# Patient Record
Sex: Male | Born: 1945 | Race: White | Hispanic: No | Smoking: Former smoker
Health system: Southern US, Community
[De-identification: ages and names within clinical notes are randomized; demographics above are authoritative.]

## PROBLEM LIST (undated history)

## (undated) DIAGNOSIS — R972 Elevated prostate specific antigen [PSA]: Secondary | ICD-10-CM

## (undated) DIAGNOSIS — M79662 Pain in left lower leg: Secondary | ICD-10-CM

## (undated) DIAGNOSIS — J45909 Unspecified asthma, uncomplicated: Secondary | ICD-10-CM

## (undated) DIAGNOSIS — M545 Low back pain, unspecified: Secondary | ICD-10-CM

## (undated) DIAGNOSIS — I441 Atrioventricular block, second degree: Secondary | ICD-10-CM

## (undated) DIAGNOSIS — J069 Acute upper respiratory infection, unspecified: Secondary | ICD-10-CM

## (undated) DIAGNOSIS — J189 Pneumonia, unspecified organism: Secondary | ICD-10-CM

## (undated) DIAGNOSIS — E119 Type 2 diabetes mellitus without complications: Secondary | ICD-10-CM

## (undated) DIAGNOSIS — N401 Enlarged prostate with lower urinary tract symptoms: Secondary | ICD-10-CM

## (undated) DIAGNOSIS — Z8614 Personal history of Methicillin resistant Staphylococcus aureus infection: Secondary | ICD-10-CM

## (undated) DIAGNOSIS — Z8673 Personal history of transient ischemic attack (TIA), and cerebral infarction without residual deficits: Secondary | ICD-10-CM

## (undated) DIAGNOSIS — K5730 Diverticulosis of large intestine without perforation or abscess without bleeding: Secondary | ICD-10-CM

## (undated) DIAGNOSIS — Z7902 Long term (current) use of antithrombotics/antiplatelets: Secondary | ICD-10-CM

## (undated) DIAGNOSIS — W19XXXA Unspecified fall, initial encounter: Secondary | ICD-10-CM

## (undated) DIAGNOSIS — N138 Other obstructive and reflux uropathy: Secondary | ICD-10-CM

## (undated) DIAGNOSIS — M1990 Unspecified osteoarthritis, unspecified site: Secondary | ICD-10-CM

## (undated) DIAGNOSIS — K449 Diaphragmatic hernia without obstruction or gangrene: Secondary | ICD-10-CM

## (undated) DIAGNOSIS — C3490 Malignant neoplasm of unspecified part of unspecified bronchus or lung: Secondary | ICD-10-CM

## (undated) DIAGNOSIS — K2970 Gastritis, unspecified, without bleeding: Secondary | ICD-10-CM

## (undated) DIAGNOSIS — R339 Retention of urine, unspecified: Secondary | ICD-10-CM

## (undated) DIAGNOSIS — I442 Atrioventricular block, complete: Secondary | ICD-10-CM

## (undated) DIAGNOSIS — M79661 Pain in right lower leg: Secondary | ICD-10-CM

## (undated) DIAGNOSIS — K648 Other hemorrhoids: Secondary | ICD-10-CM

## (undated) DIAGNOSIS — K219 Gastro-esophageal reflux disease without esophagitis: Secondary | ICD-10-CM

## (undated) DIAGNOSIS — E785 Hyperlipidemia, unspecified: Secondary | ICD-10-CM

## (undated) DIAGNOSIS — Z860100 Personal history of colon polyps, unspecified: Secondary | ICD-10-CM

## (undated) DIAGNOSIS — I4891 Unspecified atrial fibrillation: Secondary | ICD-10-CM

## (undated) DIAGNOSIS — D3502 Benign neoplasm of left adrenal gland: Secondary | ICD-10-CM

## (undated) DIAGNOSIS — I2510 Atherosclerotic heart disease of native coronary artery without angina pectoris: Secondary | ICD-10-CM

## (undated) DIAGNOSIS — I700 Atherosclerosis of aorta: Secondary | ICD-10-CM

## (undated) DIAGNOSIS — D649 Anemia, unspecified: Secondary | ICD-10-CM

## (undated) DIAGNOSIS — I10 Essential (primary) hypertension: Secondary | ICD-10-CM

## (undated) DIAGNOSIS — I998 Other disorder of circulatory system: Secondary | ICD-10-CM

## (undated) DIAGNOSIS — Z794 Long term (current) use of insulin: Secondary | ICD-10-CM

## (undated) DIAGNOSIS — K31819 Angiodysplasia of stomach and duodenum without bleeding: Secondary | ICD-10-CM

## (undated) DIAGNOSIS — R0609 Other forms of dyspnea: Secondary | ICD-10-CM

## (undated) DIAGNOSIS — K222 Esophageal obstruction: Secondary | ICD-10-CM

## (undated) DIAGNOSIS — I5189 Other ill-defined heart diseases: Secondary | ICD-10-CM

## (undated) DIAGNOSIS — I6523 Occlusion and stenosis of bilateral carotid arteries: Secondary | ICD-10-CM

## (undated) DIAGNOSIS — N529 Male erectile dysfunction, unspecified: Secondary | ICD-10-CM

## (undated) DIAGNOSIS — I739 Peripheral vascular disease, unspecified: Secondary | ICD-10-CM

## (undated) DIAGNOSIS — Z720 Tobacco use: Secondary | ICD-10-CM

## (undated) DIAGNOSIS — J439 Emphysema, unspecified: Secondary | ICD-10-CM

## (undated) HISTORY — DX: Hyperlipidemia, unspecified: E78.5

## (undated) HISTORY — DX: Retention of urine, unspecified: R33.9

## (undated) HISTORY — DX: Gastritis, unspecified, without bleeding: K29.70

## (undated) HISTORY — DX: Low back pain, unspecified: M54.50

## (undated) HISTORY — DX: Diaphragmatic hernia without obstruction or gangrene: K44.9

## (undated) HISTORY — DX: Unspecified osteoarthritis, unspecified site: M19.90

## (undated) HISTORY — DX: Essential (primary) hypertension: I10

## (undated) HISTORY — DX: Low back pain: M54.5

## (undated) HISTORY — DX: Acute upper respiratory infection, unspecified: J06.9

## (undated) HISTORY — DX: Emphysema, unspecified: J43.9

## (undated) HISTORY — DX: Gastro-esophageal reflux disease without esophagitis: K21.9

## (undated) HISTORY — DX: Benign prostatic hyperplasia with lower urinary tract symptoms: N40.1

## (undated) HISTORY — PX: UPPER GASTROINTESTINAL ENDOSCOPY: SHX188

## (undated) HISTORY — DX: Other obstructive and reflux uropathy: N13.8

## (undated) HISTORY — DX: Elevated prostate specific antigen (PSA): R97.20

## (undated) HISTORY — DX: Tobacco use: Z72.0

## (undated) HISTORY — DX: Unspecified asthma, uncomplicated: J45.909

## (undated) HISTORY — PX: COLONOSCOPY: SHX174

## (undated) HISTORY — DX: Atherosclerotic heart disease of native coronary artery without angina pectoris: I25.10

## (undated) HISTORY — PX: BACK SURGERY: SHX140

## (undated) HISTORY — DX: Other hemorrhoids: K64.8

## (undated) HISTORY — DX: Personal history of colonic polyps: Z86.010

## (undated) HISTORY — DX: Esophageal obstruction: K22.2

## (undated) HISTORY — DX: Personal history of colon polyps, unspecified: Z86.0100

---

## 1993-12-07 HISTORY — PX: POLYPECTOMY: SHX149

## 1996-11-06 ENCOUNTER — Encounter (INDEPENDENT_AMBULATORY_CARE_PROVIDER_SITE_OTHER): Payer: Self-pay | Admitting: Internal Medicine

## 1997-05-29 ENCOUNTER — Ambulatory Visit (HOSPITAL_BASED_OUTPATIENT_CLINIC_OR_DEPARTMENT_OTHER): Admission: RE | Admit: 1997-05-29 | Discharge: 1997-05-29 | Payer: Self-pay | Admitting: Surgery

## 1998-05-08 ENCOUNTER — Encounter (INDEPENDENT_AMBULATORY_CARE_PROVIDER_SITE_OTHER): Payer: Self-pay | Admitting: Internal Medicine

## 1998-11-07 HISTORY — PX: KNEE ARTHROSCOPY: SUR90

## 1999-03-07 ENCOUNTER — Inpatient Hospital Stay (HOSPITAL_COMMUNITY): Admission: EM | Admit: 1999-03-07 | Discharge: 1999-03-09 | Payer: Self-pay | Admitting: Emergency Medicine

## 1999-04-01 ENCOUNTER — Encounter: Admission: RE | Admit: 1999-04-01 | Discharge: 1999-06-30 | Payer: Self-pay | Admitting: Internal Medicine

## 1999-04-07 HISTORY — PX: OTHER SURGICAL HISTORY: SHX169

## 2000-03-09 ENCOUNTER — Encounter (INDEPENDENT_AMBULATORY_CARE_PROVIDER_SITE_OTHER): Payer: Self-pay | Admitting: Internal Medicine

## 2000-11-06 ENCOUNTER — Encounter (INDEPENDENT_AMBULATORY_CARE_PROVIDER_SITE_OTHER): Payer: Self-pay | Admitting: Internal Medicine

## 2000-11-06 LAB — CONVERTED CEMR LAB: Hgb A1c MFr Bld: 9.7 %

## 2001-01-06 ENCOUNTER — Encounter (INDEPENDENT_AMBULATORY_CARE_PROVIDER_SITE_OTHER): Payer: Self-pay | Admitting: Internal Medicine

## 2001-01-06 LAB — CONVERTED CEMR LAB: PSA: 1 ng/mL

## 2001-04-10 ENCOUNTER — Encounter: Admission: RE | Admit: 2001-04-10 | Discharge: 2001-07-09 | Payer: Self-pay | Admitting: *Deleted

## 2001-07-07 HISTORY — PX: OTHER SURGICAL HISTORY: SHX169

## 2001-07-28 ENCOUNTER — Encounter: Payer: Self-pay | Admitting: Emergency Medicine

## 2001-07-28 ENCOUNTER — Emergency Department (HOSPITAL_COMMUNITY): Admission: EM | Admit: 2001-07-28 | Discharge: 2001-07-28 | Payer: Self-pay | Admitting: *Deleted

## 2001-07-29 ENCOUNTER — Encounter: Payer: Self-pay | Admitting: Internal Medicine

## 2001-07-29 ENCOUNTER — Inpatient Hospital Stay (HOSPITAL_COMMUNITY): Admission: EM | Admit: 2001-07-29 | Discharge: 2001-08-02 | Payer: Self-pay | Admitting: Podiatry

## 2001-07-30 ENCOUNTER — Encounter: Payer: Self-pay | Admitting: Internal Medicine

## 2001-08-01 ENCOUNTER — Encounter: Payer: Self-pay | Admitting: Internal Medicine

## 2002-01-06 ENCOUNTER — Encounter (INDEPENDENT_AMBULATORY_CARE_PROVIDER_SITE_OTHER): Payer: Self-pay | Admitting: Internal Medicine

## 2003-03-10 ENCOUNTER — Encounter (INDEPENDENT_AMBULATORY_CARE_PROVIDER_SITE_OTHER): Payer: Self-pay | Admitting: Internal Medicine

## 2003-03-10 LAB — CONVERTED CEMR LAB: PSA: 1.1 ng/mL

## 2003-11-07 ENCOUNTER — Encounter (INDEPENDENT_AMBULATORY_CARE_PROVIDER_SITE_OTHER): Payer: Self-pay | Admitting: Internal Medicine

## 2003-11-12 ENCOUNTER — Encounter: Payer: Self-pay | Admitting: Gastroenterology

## 2003-11-12 LAB — HM COLONOSCOPY

## 2004-02-12 ENCOUNTER — Ambulatory Visit: Payer: Self-pay | Admitting: Family Medicine

## 2004-03-11 ENCOUNTER — Ambulatory Visit: Payer: Self-pay | Admitting: Family Medicine

## 2004-04-18 ENCOUNTER — Ambulatory Visit: Payer: Self-pay | Admitting: Family Medicine

## 2004-05-07 ENCOUNTER — Encounter (INDEPENDENT_AMBULATORY_CARE_PROVIDER_SITE_OTHER): Payer: Self-pay | Admitting: Internal Medicine

## 2004-06-01 ENCOUNTER — Ambulatory Visit: Payer: Self-pay | Admitting: Family Medicine

## 2004-06-06 ENCOUNTER — Encounter (INDEPENDENT_AMBULATORY_CARE_PROVIDER_SITE_OTHER): Payer: Self-pay | Admitting: Internal Medicine

## 2004-06-07 ENCOUNTER — Ambulatory Visit: Payer: Self-pay | Admitting: Family Medicine

## 2004-08-06 HISTORY — PX: OTHER SURGICAL HISTORY: SHX169

## 2004-08-23 ENCOUNTER — Ambulatory Visit: Payer: Self-pay | Admitting: Cardiology

## 2004-08-23 ENCOUNTER — Inpatient Hospital Stay (HOSPITAL_COMMUNITY): Admission: EM | Admit: 2004-08-23 | Discharge: 2004-08-24 | Payer: Self-pay | Admitting: *Deleted

## 2004-08-24 ENCOUNTER — Ambulatory Visit: Payer: Self-pay

## 2004-09-02 ENCOUNTER — Ambulatory Visit: Payer: Self-pay | Admitting: Cardiology

## 2004-09-06 ENCOUNTER — Ambulatory Visit: Payer: Self-pay | Admitting: Family Medicine

## 2004-09-06 ENCOUNTER — Encounter (INDEPENDENT_AMBULATORY_CARE_PROVIDER_SITE_OTHER): Payer: Self-pay | Admitting: Internal Medicine

## 2004-09-06 LAB — CONVERTED CEMR LAB: Hgb A1c MFr Bld: 6.3 %

## 2004-10-07 ENCOUNTER — Encounter (INDEPENDENT_AMBULATORY_CARE_PROVIDER_SITE_OTHER): Payer: Self-pay | Admitting: Internal Medicine

## 2004-12-02 ENCOUNTER — Ambulatory Visit: Payer: Self-pay | Admitting: Cardiology

## 2005-04-06 ENCOUNTER — Encounter (INDEPENDENT_AMBULATORY_CARE_PROVIDER_SITE_OTHER): Payer: Self-pay | Admitting: Internal Medicine

## 2005-04-06 LAB — CONVERTED CEMR LAB: Hgb A1c MFr Bld: 6.5 %

## 2005-04-19 ENCOUNTER — Ambulatory Visit: Payer: Self-pay | Admitting: Family Medicine

## 2005-04-20 ENCOUNTER — Encounter (INDEPENDENT_AMBULATORY_CARE_PROVIDER_SITE_OTHER): Payer: Self-pay | Admitting: Internal Medicine

## 2005-04-20 LAB — CONVERTED CEMR LAB: Microalbumin U total vol: 0.8 mg/L

## 2005-09-06 ENCOUNTER — Encounter (INDEPENDENT_AMBULATORY_CARE_PROVIDER_SITE_OTHER): Payer: Self-pay | Admitting: Internal Medicine

## 2005-09-19 ENCOUNTER — Ambulatory Visit: Payer: Self-pay | Admitting: Family Medicine

## 2005-09-25 ENCOUNTER — Ambulatory Visit: Payer: Self-pay | Admitting: Cardiovascular Disease

## 2006-01-15 ENCOUNTER — Ambulatory Visit: Payer: Self-pay | Admitting: Family Medicine

## 2006-01-16 ENCOUNTER — Encounter (INDEPENDENT_AMBULATORY_CARE_PROVIDER_SITE_OTHER): Payer: Self-pay | Admitting: Internal Medicine

## 2006-01-16 ENCOUNTER — Ambulatory Visit: Payer: Self-pay | Admitting: Family Medicine

## 2006-01-16 LAB — CONVERTED CEMR LAB: Hgb A1c MFr Bld: 7 %

## 2006-06-18 ENCOUNTER — Ambulatory Visit: Payer: Self-pay | Admitting: Internal Medicine

## 2006-06-20 LAB — CONVERTED CEMR LAB: Hgb A1c MFr Bld: 7.9 % — ABNORMAL HIGH (ref 4.6–6.0)

## 2006-06-21 ENCOUNTER — Ambulatory Visit: Payer: Self-pay | Admitting: Internal Medicine

## 2006-06-27 ENCOUNTER — Encounter (INDEPENDENT_AMBULATORY_CARE_PROVIDER_SITE_OTHER): Payer: Self-pay | Admitting: Internal Medicine

## 2006-06-27 DIAGNOSIS — I2510 Atherosclerotic heart disease of native coronary artery without angina pectoris: Secondary | ICD-10-CM

## 2006-06-27 DIAGNOSIS — M545 Low back pain, unspecified: Secondary | ICD-10-CM | POA: Insufficient documentation

## 2006-06-27 DIAGNOSIS — K648 Other hemorrhoids: Secondary | ICD-10-CM | POA: Insufficient documentation

## 2006-06-27 DIAGNOSIS — E119 Type 2 diabetes mellitus without complications: Secondary | ICD-10-CM | POA: Insufficient documentation

## 2006-06-27 DIAGNOSIS — Z720 Tobacco use: Secondary | ICD-10-CM

## 2006-06-27 DIAGNOSIS — E785 Hyperlipidemia, unspecified: Secondary | ICD-10-CM

## 2006-06-28 ENCOUNTER — Ambulatory Visit: Payer: Self-pay | Admitting: Internal Medicine

## 2006-08-29 ENCOUNTER — Ambulatory Visit: Payer: Self-pay | Admitting: Internal Medicine

## 2006-11-19 ENCOUNTER — Ambulatory Visit: Payer: Self-pay | Admitting: Internal Medicine

## 2006-11-19 DIAGNOSIS — I10 Essential (primary) hypertension: Secondary | ICD-10-CM

## 2006-11-19 DIAGNOSIS — R972 Elevated prostate specific antigen [PSA]: Secondary | ICD-10-CM | POA: Insufficient documentation

## 2006-11-19 DIAGNOSIS — J069 Acute upper respiratory infection, unspecified: Secondary | ICD-10-CM | POA: Insufficient documentation

## 2006-11-22 LAB — CONVERTED CEMR LAB
ALT: 19 units/L (ref 0–53)
BUN: 15 mg/dL (ref 6–23)
CO2: 27 meq/L (ref 19–32)
Calcium: 9.3 mg/dL (ref 8.4–10.5)
Creatinine,U: 152.8 mg/dL
GFR calc non Af Amer: 91 mL/min
Glucose, Bld: 124 mg/dL — ABNORMAL HIGH (ref 70–99)
Hgb A1c MFr Bld: 6.8 % — ABNORMAL HIGH (ref 4.6–6.0)
LDL Cholesterol: 59 mg/dL (ref 0–99)
Microalb, Ur: 1 mg/dL (ref 0.0–1.9)
PSA: 1.74 ng/mL (ref 0.10–4.00)
Potassium: 4 meq/L (ref 3.5–5.1)
Sodium: 140 meq/L (ref 135–145)
Total CHOL/HDL Ratio: 3.2
VLDL: 17 mg/dL (ref 0–40)

## 2006-11-23 ENCOUNTER — Encounter (INDEPENDENT_AMBULATORY_CARE_PROVIDER_SITE_OTHER): Payer: Self-pay | Admitting: Internal Medicine

## 2006-12-23 ENCOUNTER — Emergency Department (HOSPITAL_COMMUNITY): Admission: EM | Admit: 2006-12-23 | Discharge: 2006-12-23 | Payer: Self-pay | Admitting: Emergency Medicine

## 2006-12-23 ENCOUNTER — Encounter (INDEPENDENT_AMBULATORY_CARE_PROVIDER_SITE_OTHER): Payer: Self-pay | Admitting: Internal Medicine

## 2006-12-25 ENCOUNTER — Emergency Department (HOSPITAL_COMMUNITY): Admission: EM | Admit: 2006-12-25 | Discharge: 2006-12-25 | Payer: Self-pay | Admitting: Emergency Medicine

## 2006-12-25 ENCOUNTER — Encounter (INDEPENDENT_AMBULATORY_CARE_PROVIDER_SITE_OTHER): Payer: Self-pay | Admitting: Internal Medicine

## 2006-12-26 ENCOUNTER — Ambulatory Visit: Payer: Self-pay | Admitting: Family Medicine

## 2006-12-26 DIAGNOSIS — K2990 Gastroduodenitis, unspecified, without bleeding: Secondary | ICD-10-CM

## 2006-12-26 DIAGNOSIS — K222 Esophageal obstruction: Secondary | ICD-10-CM

## 2006-12-26 DIAGNOSIS — K2970 Gastritis, unspecified, without bleeding: Secondary | ICD-10-CM | POA: Insufficient documentation

## 2007-01-02 ENCOUNTER — Encounter (INDEPENDENT_AMBULATORY_CARE_PROVIDER_SITE_OTHER): Payer: Self-pay | Admitting: Internal Medicine

## 2007-01-08 ENCOUNTER — Encounter (INDEPENDENT_AMBULATORY_CARE_PROVIDER_SITE_OTHER): Payer: Self-pay | Admitting: Internal Medicine

## 2007-01-16 ENCOUNTER — Ambulatory Visit: Payer: Self-pay | Admitting: Gastroenterology

## 2007-01-25 ENCOUNTER — Encounter (INDEPENDENT_AMBULATORY_CARE_PROVIDER_SITE_OTHER): Payer: Self-pay | Admitting: Internal Medicine

## 2007-02-12 ENCOUNTER — Ambulatory Visit: Payer: Self-pay | Admitting: Family Medicine

## 2007-02-13 ENCOUNTER — Ambulatory Visit: Payer: Self-pay | Admitting: Gastroenterology

## 2007-02-13 ENCOUNTER — Encounter (INDEPENDENT_AMBULATORY_CARE_PROVIDER_SITE_OTHER): Payer: Self-pay | Admitting: Internal Medicine

## 2007-02-13 HISTORY — PX: ESOPHAGOGASTRODUODENOSCOPY: SHX1529

## 2007-02-14 LAB — CONVERTED CEMR LAB: Hgb A1c MFr Bld: 6.7 % — ABNORMAL HIGH (ref 4.6–6.0)

## 2007-04-15 ENCOUNTER — Ambulatory Visit: Payer: Self-pay | Admitting: Family Medicine

## 2007-04-18 ENCOUNTER — Encounter (INDEPENDENT_AMBULATORY_CARE_PROVIDER_SITE_OTHER): Payer: Self-pay | Admitting: Internal Medicine

## 2007-05-13 ENCOUNTER — Ambulatory Visit: Payer: Self-pay | Admitting: Family Medicine

## 2007-05-31 ENCOUNTER — Ambulatory Visit: Payer: Self-pay | Admitting: Family Medicine

## 2007-05-31 LAB — CONVERTED CEMR LAB
Chloride: 111 meq/L (ref 96–112)
Cholesterol: 118 mg/dL (ref 0–200)
Potassium: 4.3 meq/L (ref 3.5–5.1)
Sodium: 143 meq/L (ref 135–145)
Triglycerides: 73 mg/dL (ref 0–149)

## 2007-06-12 ENCOUNTER — Ambulatory Visit: Payer: Self-pay | Admitting: Family Medicine

## 2007-06-28 ENCOUNTER — Emergency Department: Payer: Self-pay | Admitting: Emergency Medicine

## 2007-07-16 ENCOUNTER — Ambulatory Visit: Payer: Self-pay | Admitting: Family Medicine

## 2007-07-19 ENCOUNTER — Encounter (INDEPENDENT_AMBULATORY_CARE_PROVIDER_SITE_OTHER): Payer: Self-pay | Admitting: Internal Medicine

## 2007-08-28 ENCOUNTER — Encounter (INDEPENDENT_AMBULATORY_CARE_PROVIDER_SITE_OTHER): Payer: Self-pay | Admitting: Internal Medicine

## 2007-08-28 HISTORY — PX: OTHER SURGICAL HISTORY: SHX169

## 2007-10-09 ENCOUNTER — Encounter (INDEPENDENT_AMBULATORY_CARE_PROVIDER_SITE_OTHER): Payer: Self-pay | Admitting: Internal Medicine

## 2007-11-06 ENCOUNTER — Encounter (INDEPENDENT_AMBULATORY_CARE_PROVIDER_SITE_OTHER): Payer: Self-pay | Admitting: *Deleted

## 2007-11-12 ENCOUNTER — Ambulatory Visit: Payer: Self-pay | Admitting: Family Medicine

## 2007-11-14 LAB — CONVERTED CEMR LAB
AST: 18 units/L (ref 0–37)
BUN: 16 mg/dL (ref 6–23)
CO2: 28 meq/L (ref 19–32)
Calcium: 9.1 mg/dL (ref 8.4–10.5)
Chloride: 111 meq/L (ref 96–112)
Creatinine,U: 111.6 mg/dL
GFR calc Af Amer: 110 mL/min
GFR calc non Af Amer: 91 mL/min
Glucose, Bld: 120 mg/dL — ABNORMAL HIGH (ref 70–99)
HDL: 36.6 mg/dL — ABNORMAL LOW (ref >39.0)
Microalb, Ur: 1.3 mg/dL (ref 0.0–1.9)
Sodium: 143 meq/L (ref 135–145)
Triglycerides: 77 mg/dL (ref 0–149)
VLDL: 15 mg/dL (ref 0–40)

## 2007-11-19 ENCOUNTER — Encounter: Payer: Self-pay | Admitting: Family Medicine

## 2007-11-19 LAB — HM DIABETES EYE EXAM: HM Diabetic Eye Exam: NORMAL

## 2007-12-31 ENCOUNTER — Ambulatory Visit: Payer: Self-pay | Admitting: Family Medicine

## 2007-12-31 LAB — CONVERTED CEMR LAB: Rapid Strep: NEGATIVE

## 2008-01-07 ENCOUNTER — Ambulatory Visit: Payer: Self-pay | Admitting: Family Medicine

## 2008-05-12 ENCOUNTER — Ambulatory Visit: Payer: Self-pay | Admitting: Internal Medicine

## 2008-05-14 LAB — CONVERTED CEMR LAB
Hgb A1c MFr Bld: 7.1 % — ABNORMAL HIGH (ref 4.6–6.5)
Total Bilirubin: 0.7 mg/dL (ref 0.3–1.2)
Total CHOL/HDL Ratio: 4
Triglycerides: 107 mg/dL (ref 0.0–149.0)
VLDL: 21.4 mg/dL (ref 0.0–40.0)

## 2008-08-17 ENCOUNTER — Ambulatory Visit: Payer: Self-pay | Admitting: Family Medicine

## 2008-08-19 LAB — CONVERTED CEMR LAB
ALT: 17 units/L (ref 0–53)
Hgb A1c MFr Bld: 7.5 % — ABNORMAL HIGH (ref 4.6–6.5)

## 2008-08-21 ENCOUNTER — Ambulatory Visit: Payer: Self-pay | Admitting: Family Medicine

## 2008-09-16 ENCOUNTER — Telehealth (INDEPENDENT_AMBULATORY_CARE_PROVIDER_SITE_OTHER): Payer: Self-pay | Admitting: Internal Medicine

## 2008-09-22 ENCOUNTER — Ambulatory Visit: Payer: Self-pay | Admitting: Family Medicine

## 2008-10-29 ENCOUNTER — Ambulatory Visit: Payer: Self-pay | Admitting: Family Medicine

## 2008-10-29 DIAGNOSIS — T23139A Burn of first degree of unspecified multiple fingers (nail), not including thumb, initial encounter: Secondary | ICD-10-CM | POA: Insufficient documentation

## 2008-10-29 DIAGNOSIS — T23209A Burn of second degree of unspecified hand, unspecified site, initial encounter: Secondary | ICD-10-CM | POA: Insufficient documentation

## 2008-11-03 ENCOUNTER — Ambulatory Visit: Payer: Self-pay | Admitting: Family Medicine

## 2008-11-23 ENCOUNTER — Ambulatory Visit: Payer: Self-pay | Admitting: Family Medicine

## 2008-12-18 ENCOUNTER — Ambulatory Visit: Payer: Self-pay | Admitting: Family Medicine

## 2008-12-22 LAB — CONVERTED CEMR LAB: Hgb A1c MFr Bld: 6.6 % — ABNORMAL HIGH (ref 4.6–6.5)

## 2008-12-24 ENCOUNTER — Ambulatory Visit: Payer: Self-pay | Admitting: Family Medicine

## 2009-03-23 ENCOUNTER — Telehealth: Payer: Self-pay | Admitting: Family Medicine

## 2009-04-20 ENCOUNTER — Ambulatory Visit: Payer: Self-pay | Admitting: Family Medicine

## 2009-04-21 LAB — CONVERTED CEMR LAB: Hgb A1c MFr Bld: 12.4 % — ABNORMAL HIGH (ref 4.6–6.5)

## 2009-04-23 ENCOUNTER — Ambulatory Visit: Payer: Self-pay | Admitting: Family Medicine

## 2009-04-29 LAB — CONVERTED CEMR LAB
ALT: 20 units/L (ref 0–53)
BUN: 18 mg/dL (ref 6–23)
Calcium: 9 mg/dL (ref 8.4–10.5)
Chloride: 100 meq/L (ref 96–112)
Creatinine, Ser: 1.1 mg/dL (ref 0.4–1.5)
Direct LDL: 108.4 mg/dL
GFR calc non Af Amer: 71.55 mL/min (ref >60)
HDL: 39.6 mg/dL (ref >39.00)
Sodium: 134 meq/L — ABNORMAL LOW (ref 135–145)
Total Bilirubin: 0.9 mg/dL (ref 0.3–1.2)
Total CHOL/HDL Ratio: 5
Triglycerides: 531 mg/dL — ABNORMAL HIGH (ref 0.0–149.0)
VLDL: 106.2 mg/dL — ABNORMAL HIGH (ref 0.0–40.0)

## 2009-06-03 ENCOUNTER — Ambulatory Visit: Payer: Self-pay | Admitting: Family Medicine

## 2009-06-04 LAB — CONVERTED CEMR LAB
Chloride: 102 meq/L (ref 96–112)
Creatinine, Ser: 0.8 mg/dL (ref 0.4–1.5)
Direct LDL: 127.4 mg/dL
GFR calc non Af Amer: 103.3 mL/min (ref >60)
HDL: 37.9 mg/dL — ABNORMAL LOW (ref >39.00)
Potassium: 4 meq/L (ref 3.5–5.1)
Sodium: 138 meq/L (ref 135–145)
Triglycerides: 251 mg/dL — ABNORMAL HIGH (ref 0.0–149.0)
VLDL: 50.2 mg/dL — ABNORMAL HIGH (ref 0.0–40.0)

## 2009-09-03 ENCOUNTER — Encounter (INDEPENDENT_AMBULATORY_CARE_PROVIDER_SITE_OTHER): Payer: Self-pay | Admitting: *Deleted

## 2009-09-14 ENCOUNTER — Encounter (INDEPENDENT_AMBULATORY_CARE_PROVIDER_SITE_OTHER): Payer: Self-pay | Admitting: *Deleted

## 2010-03-08 NOTE — Progress Notes (Signed)
Summary: DIABETIC TEACHING  Phone Note From Other Clinic   Caller: Receptionist Summary of Call: Diabetic Teaching information from [place] mailed to pt.Antonio [name]  March 23, 2009 9:54 AM  Initial call taken by: Antonio [name],  March 23, 2009 9:54 AM

## 2010-03-08 NOTE — Letter (Signed)
Summary: [place] No Show Letter  [place] at [facility]  [address] [place], [place] [address]   Phone: [phone]  Fax: [phone]    09/03/2009 MRN: [id number]  MICHEIL KLAUS [address] [place], [place]  [address]   Dear Mr. GREENBLATT,   Our records indicate that you missed your scheduled appointment with _____lab________________ on __7.29.11__________.  Please contact this office to reschedule your appointment as soon as possible.  It is important that you keep your scheduled appointments with your physician, so we can provide you the best care possible.  Please be advised that there may be a charge for "no show" appointments.    Sincerely,   [place] at [facility]

## 2010-03-08 NOTE — Assessment & Plan Note (Signed)
Summary: 30 minute OV per Dr. Dayton Martes Salley Scarlet   History of Present Illness: 65 yo male new to me here for follow labs.  DM-  a1c went from 6.4 to 12.4 this month. I asked him to come in to discuss. Wife has dementia, forced to retire, lost insurance for a short time so he stopped taking his medication. Not checking his sugars either.   Supposed to be taking Actos 45 mg daily, Metformin 1000 mg two times a day, adn Levemir 18 units at bedtime.  HTN- was tolerating his medications but stopped taking them as well as stated above.  Has lost some weight since last appt, intentional.  Feels better physically than he has in a while. (186 today was 201 in 12/2008.  HLD- not been checked since 08/2008.  Had been tolerating Tricor and Lipitor well but stopped taking these meds as well due to financial situation.    Current Medications (verified): 1)  Actos 45 Mg Tabs (Pioglitazone Hcl) .[name].. 1 Daily By Mouth 2)  Tricor 145 Mg Tabs (Fenofibrate) .[name].. 1 Daily By Mouth 3)  Aspirin 325 Mg Tabs (Aspirin) .... Take 1 Tablet By Mouth Once A Day 4)  Fish Oil 1000 Mg  Caps (Omega-3 Fatty Acids) .... 2 Qam 5)  Lipitor 20 Mg Tabs (Atorvastatin Calcium) .... Take One By Mouth Daily 6)  Altace 10 Mg Tabs (Ramipril) .[name].. 1 Daily For Bp 7)  Metoprolol Tartrate 50 Mg Tabs (Metoprolol Tartrate) .... 1/2 Tablet Twice A Day By Mouth 8)  Levemir Flexpen 100 Unit/ml Soln (Insulin Detemir) .[name].. 18 Units Each Bedtime 9)  Metformin Hcl 500 Mg Tabs (Metformin Hcl) .... Take 2 Pills Two Times A Day For Diabetes 10)  Amoxicillin 500 Mg Caps (Amoxicillin) .... Take 2 Two Times A Day 11)  Hycodan .... Take 1 Tsp At Bedtime For Cough  Allergies (verified): 1)  ! * Clorox  Review of Systems      See HPI General:  Denies malaise. Eyes:  Denies blurring. CV:  Denies chest pain or discomfort, shortness of breath with exertion, swelling of feet, and swelling of hands. Endo:  Denies cold intolerance, excessive hunger, polyuria,  and weight change.  Physical Exam  General:  Well-developed,well-nourished,in no acute distress; alert,appropriate and cooperative throughout examination Mouth:  Oral mucosa and oropharynx without lesions or exudates.  Teeth in good repair. Thick clear PND. Lungs:  Normal respiratory effort, chest expands symmetrically. Lungs are clear to auscultation, no crackles or wheezes. Heart:  Normal rate and regular rhythm. S1 and S2 normal without gallop, murmur, click, rub or other extra sounds. Extremities:  no edema Psych:  Cognition and judgment appear intact. Alert and cooperative with normal attention span and concentration. No apparent delusions, illusions, hallucinations   Impression & Recommendations:  Problem # 1:  DIABETES MELLITUS, TYPE II (ICD-250.00) Assessment Deteriorated Time spent with patient 25 minutes, more than 50% of this time was spent counseling patient on importance of taking care of himself,especially with diabetes. He has insurance again now and willing to go back on his medication. He is so concerned about caring for his wife and realizes that if something happens to him, he can't care for her. Will call in refill. Recheck a1c in 2 months.  His updated medication list for this problem includes:    Actos 45 Mg Tabs (Pioglitazone hcl) .[name]... 1 daily by mouth    Aspirin 325 Mg Tabs (Aspirin) .[name]... Take 1 tablet by mouth once a day    Altace  10 Mg Tabs (Ramipril) .[name]... 1 daily for bp    Levemir Flexpen 100 Unit/ml Soln (Insulin detemir) .Marland KitchenMarland KitchenMarland KitchenMarland Kitchen 18 units each bedtime    Metformin Hcl 500 Mg Tabs (Metformin hcl) .[name]... Take 2 pills two times a day for diabetes  Problem # 2:  HYPERTENSION, BENIGN ESSENTIAL (ICD-401.1) Assessment: Deteriorated Will restart meds.  See above. His updated medication list for this problem includes:    Altace 10 Mg Tabs (Ramipril) .[name]... 1 daily for bp    Metoprolol Tartrate 50 Mg Tabs (Metoprolol tartrate) .[name]... 1/2 tablet twice a day by  mouth  Orders: Venipuncture (04540) TLB-BMP (Basic Metabolic Panel-BMET) (80048-METABOL)  Problem # 3:  HYPERLIPIDEMIA (ICD-272.4) Assessment: Unchanged Recheck FLP today, likely deteriorated.  He will restart his meds today. His updated medication list for this problem includes:    Tricor 145 Mg Tabs (Fenofibrate) .[name]... 1 daily by mouth    Lipitor 20 Mg Tabs (Atorvastatin calcium) .[name]... Take one by mouth daily  Orders: Venipuncture (98119) TLB-Lipid Panel (80061-LIPID) TLB-Hepatic/Liver Function Pnl (80076-HEPATIC)  Complete Medication List: 1)  Actos 45 Mg Tabs (Pioglitazone hcl) .[name].. 1 daily by mouth 2)  Tricor 145 Mg Tabs (Fenofibrate) .[name].. 1 daily by mouth 3)  Aspirin 325 Mg Tabs (Aspirin) .... Take 1 tablet by mouth once a day 4)  Fish Oil 1000 Mg Caps (Omega-3 fatty acids) .... 2 qam 5)  Lipitor 20 Mg Tabs (Atorvastatin calcium) .... Take one by mouth daily 6)  Altace 10 Mg Tabs (Ramipril) .[name].. 1 daily for bp 7)  Metoprolol Tartrate 50 Mg Tabs (Metoprolol tartrate) .... 1/2 tablet twice a day by mouth 8)  Levemir Flexpen 100 Unit/ml Soln (Insulin detemir) .[name].. 18 units each bedtime 9)  Metformin Hcl 500 Mg Tabs (Metformin hcl) .... Take 2 pills two times a day for diabetes 10)  Amoxicillin 500 Mg Caps (Amoxicillin) .... Take 2 two times a day 11)  Hycodan  .... Take 1 tsp at bedtime for cough  Patient Instructions: 1)  Great to see you. 2)  Glad you are restarting your medication. 3)  Please come see me in 2 months. Prescriptions: LIPITOR 20 MG TABS (ATORVASTATIN CALCIUM) Take one by mouth daily  #30 Each x 5   Entered and Authorized by:   Ruthe Mannan MD   Signed by:   Ruthe Mannan MD on 04/23/2009   Method used:   Electronically to        [facility] [address] (retail)       [address], [address]       [place], [place]  [address]       Ph: [phone]       Fax: [phone]   RxID:   [id number] TRICOR 145 MG TABS (FENOFIBRATE)  1 daily BY MOUTH  #30 Each x 11   Entered and Authorized by:   Ruthe Mannan MD   Signed by:   Ruthe Mannan MD on 04/23/2009   Method used:   Electronically to        [facility] [address] (retail)       [address], [address]       [place], [place]  [address]       Ph: [phone]       Fax: [phone]   RxID:   [id number] ACTOS 45 MG TABS (PIOGLITAZONE HCL) 1 DAILY BY MOUTH  #30 Each x  11   Entered and Authorized by:   Ruthe Mannan MD   Signed by:   Ruthe Mannan MD on 04/23/2009   Method used:   Electronically to        [facility] [address] (retail)       [address]       [place], [place]  [address]       Ph: [phone]       Fax: [phone]   RxID:   [id number]

## 2010-03-08 NOTE — Letter (Signed)
Summary: Nadara Eaton letter  [place] at [facility]  [address] [place], [place] [address]   Phone: [phone]  Fax: [phone]       09/14/2009 MRN: [id number]  NELSON NOONE [address] [place], [place]  [address]  Dear Mr. OLOF, [facility] [place] and [facility] announce the retirement of Arta Silence, M.D., from full-time practice at the [facility] office effective August 05, 2009 and his plans of returning part-time.  It is important to Dr. Hetty Ely and to our practice that you understand that [facility] has seven physicians in our office for your health care needs.  We will continue to offer the same exceptional care that you have today.    Dr. Hetty Ely has spoken to many of you about his plans for retirement and returning part-time in the fall.   We will continue to work with you through the transition to schedule appointments for you in the office and meet the high standards that [facility] is committed to.   Again, it is with great pleasure that we share the news that Dr. Hetty Ely will return to [facility] at [facility] in October of 2011 with a reduced schedule.    If you have any questions, or would like to request an appointment with one of our physicians, please call us at [phone] and press the option for Scheduling an appointment.  We take pleasure in providing you with excellent patient care and look forward to seeing you at your next office visit.  Our [facility] Physicians are:  Tillman Abide, M.D. Laurita Quint, M.D. Roxy Manns, M.D. Kerby Nora, M.D. Hannah Beat, M.D. Ruthe Mannan, M.D. We proudly welcomed Raechel Ache, M.D. and Eustaquio Boyden, M.D. to the practice in July/August 2011.  Sincerely,  [place] [facility]

## 2010-06-21 NOTE — Assessment & Plan Note (Signed)
[facility]                         GASTROENTEROLOGY OFFICE NOTE   ADISON, JERGER                        MRN:          [id number]  DATE:01/16/2007                            DOB:          16-May-1945    REASON FOR CONSULT:  Dysphagia and small volume hematochezia.   HISTORY OF PRESENT ILLNESS:  This is a 65 year old white male who I have  evaluated in the past.  He has a history of heartburn, indigestion and  difficulty swallowing meats, breads and pills for about the past 5-6  months.  He was recently started on Protonix with some improvement of  his symptoms.  He has had occasional very small volume hematochezia with  bowel movements.  He underwent a CT scan on the abdomen on December 23, 2006 that revealed distal esophageal wall thickening.  He previously  underwent colonoscopy on November 12, 2003 for screening that showed only  small internal hemorrhoids.  He has no odynophagia, weight loss, change  in bowel habits or change in stool caliber.   PAST MEDICAL HISTORY:  1. Hypertension.  2. Coronary artery disease.  3. Hyperlipidemia.  4. Diabetes mellitus type 2.  5. Status post right knee arthroscopy.  6. Status post back surgery.  7. COPD.   CURRENT MEDICATIONS:  Listed on the chart, updated and reviewed.   MEDICATION ALLERGIES:  NONE KNOWN.   SOCIAL HISTORY:  Per the handwritten form.   REVIEW OF SYSTEMS:  Per the handwritten form.   PHYSICAL EXAMINATION:  No acute distress.  Height 5 feet 7 inches,  weight 201 pounds, blood pressure is 124/68, pulse 72 and regular.  HEENT:  Anicteric sclerae, oropharynx clear.  CHEST:  Clear to auscultation bilaterally.  CARDIAC:  Regular rate and rhythm without murmurs appreciated.  ABDOMEN:  Soft, nontender, nondistended, normoactive bowel sounds.  No  palpable organomegaly, masses or hernias.  EXTREMITIES:  Without clubbing, cyanosis or edema.  NEUROLOGIC:  Alert and oriented x3, grossly  nonfocal.   ASSESSMENT/PLAN:  1. Gastroesophageal reflux disease, benign appearing esophageal wall      thickening and dysphagia.  I suspect he has a peptic stricture or      esophagitis, rule out esophageal neoplasms and other disorders.  He      should remain on a proton pump inhibitor indefinitely along with      standard antireflux measures.  Change to omeprazole 40 mg p.o.      q.a.m. for cost reasons.  Risks, benefits and alternatives to upper      endoscopy and possible biopsy discussed with the patient and he      consents to proceed, this will be scheduled electively.  2. Small volume hematochezia, likely secondary to small internal      hemorrhoids, being Anusol suppositories p.r.n., standard rectal      care instructions supplied.  If his symptoms persist will consider      repeat colonoscopy.     Venita Lick. Russella Dar, MD, [facility]  Electronically Signed    MTS/MedQ  DD: 01/21/2007  DT: 01/22/2007  Job #: [id number]   cc:  Billie D. Bean, FNP

## 2010-06-24 NOTE — Discharge Summary (Signed)
[place]  Patient:    Antonio [name]                         MRN: [id number] Adm. Date:  [id number] Disc. Date: [id number] Attending:  Heber Severy Dictator:   Cornell Barman, P.A. CC:         Dr. Laretta Bolster. Dagoberto Ligas, M.D.                           Discharge Summary  DISCHARGE DIAGNOSES: 1. Chest pain. 2. Hypertension. 3. Hypercholesterolemia. 4. Hypertriglyceridemia. 5. Tobacco use. 6. Abnormal electrocardiogram, 50-60% stenosis of the first diagonal.  Question    ruptured plaque in the left anterior descending coronary artery.  HISTORY OF PRESENT ILLNESS:  [name] is a 65 year old white male with constant chest pain since Saturday morning prior to this admission.  The pain is nonexertional.  The pain varies in intensity.  He does not report any exacerbating or alleviating factors.  He denied radiation, dyspnea, or diaphoresis.  The pain is similar to indigestion but more intense pressure.  No relief with Rolaids.  He denies exertional chest pain, dyspnea on exertion, or decrease exercise tolerance.  Cardiac risk factors include diabetes mellitus, tobacco use, hypercholesterolemia. He denies hypertension or family history.  PAST MEDICAL HISTORY:  Noninsulin-dependent diabetes mellitus diagnosed two years prior to this admission.  Status post arthroscopic surgery of the right knee secondary to torn cartilage in November of 2000.  He has been on Vioxx since that time.  History of back surgery.  History of colon polyps in 1995. Hyperlipidemia.  LABORATORY DATA:  Cholesterol is 238, triglycerides 953, HDL 32.  White count was 14.8, hemoglobin 16, hematocrit 48 with 74% neutrophils, sodium was 138, potassium 5.1, glucose 158. CK 119, MB 1.1, troponin I was less than 0.03.  The patient had T-wave inversions in the lateral leads.  HOSPITAL COURSE:  #1 - CHEST PAIN WITH MULTIPLE RISK FACTORS:  The  patient was admitted to rule out myocardial infarction.  The patient did have EKG changes but cardiac enzymes were negative.  The patient was empirically started on heparin and then Integrilin.  The patient was sent to the catheterization lab on March 08, 1999.  The patient had normal LV function with no wall motion abnormalities.  He had a probable ruptured plaque ith thrombus and a LAD with resolution.  He had a 50-60% stenosis of the first diagonal.  Medical therapy was recommended as well as an outpatient Cardiolite.  The patient was felt stable for discharge on March 09, 1999.  The patient will need to continue aspirin, Lasix, and Tri-Chlor, and one month of Plavix.  The patient will follow up with Dr. Alfonse Alpers. Gegick in two to three weeks as an outpatient.  DISCHARGE MEDICATIONS: 1. Lopressor 25 mg b.i.d. 2. Aspirin 325 mg q.d. 3. Tri-Chlor 200 mg q.d. 4. Zyban 150 mg 2 tablets q.d. 5. Glucotrol 5 mg q.d. 6. Plavix 75 mg q.d. for four weeks.  FOLLOW-UP:  The patient is to follow up with Dr. Alfonse Alpers. Gegick in two to three weeks.  The office will call to arrange the appointment and to follow up with [place] in 7 to 10 days.  The patient has been instructed not to smoke.DD:  03/09/99 TD:  03/09/99 Job: [id number]

## 2010-06-24 NOTE — Assessment & Plan Note (Signed)
[facility]                              CARDIOLOGY OFFICE NOTE   BLONG, BUSK                        MRN:          [id number]  DATE:09/25/2005                            DOB:          05/23/1945    HISTORY OF PRESENT ILLNESS:  [name] is a very pleasant 65 year old man  with a history of coronary artery disease who has been managed medically  over the years.  He presents today for his yearly followup.  In reviewing  his records he underwent a adenosine myocardial perfusion study in July of  2006 that showed a very small area of irreversibility in the inferior base  but there was no evidence of infarction and his gait of ejection fraction  was 67%.  Overall the study was low risk.   [name] continues to do well from a symptomatic standpoint.  He  specifically denies chest pain, dyspnea, light headedness, palpitations,  syncope, orthopnea, PND or edema.  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  He does not  exercise.   CURRENT MEDICATIONS:  1. Metformin 500 mg twice daily.  2. Aspirin 325 mg daily.  3. Altace 5 mg daily.  4. Actos 45 mg daily.  5. Lipitor 10 mg daily.  6. Fish Oil  2 daily.  7. Metoprolol 25 mg twice daily.  8. Tricor 145 mg daily.  9. Glucovance 5/500 mg daily   PHYSICAL EXAMINATION:  GENERAL:  He is alert and oriented and in no acute  distress.  VITAL SIGNS:  Weight is 216 pounds.  Blood pressure is 136/72 in the left  arm.  Heart rate is 76.  Respiratory rate is 12.  On my check his blood  pressure in the right arm was 118/78.  EYES:  Eyes, sclerae anicteric, conjunctivae pink.  Pupils are reactive to  light.  ENT:  Oropharynx is clear.  There is no cervical lymphadenopathy.  CARDIOVASCULAR:  The carotid upstrokes are normal without bruits.  Jugular  venous pressure is normal.  The apex is normal to palpation.  Heart is  regular rate and rhythm  without murmurs or gallops.  LUNGS:  Clear to auscultation bilaterally.  ABDOMEN: Soft, non-tender, no organomegaly, positive bowel sounds.  No  abdominal bruits.  EXTREMITIES:  No clubbing, cyanosis or edema present.  Peripheral pulses are  1+ in the feet.  The left leg shows muscle wasting.  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   LABORATORY DATA:  Reviewed.  Showed blood work from August 14 that  demonstrated normal creatinine of 1.0.  His total cholesterol of 150.  Triglycerides 125.  HDL is 36 and an LDL was 89.  His hemoglobin A1C was  6.5.   ASSESSMENT/PLAN:  This is a 65 year old man with coronary artery disease.  He is currently stable from a cardiovascular standpoint and is asymptomatic.  His current problem list is as follows:  1. Coronary artery disease.  Catheterization in January 2001 demonstrated  a calcified LAD and a moderate first diagonal lesion.  He has done well      with medical management and had a low risk Myocardial perfusion study      performed in July of 2006.  We will plan on continued medical therapy      with Ace-inhibitor, Beta-blocker, aspirin and Statin therapy as above.      We will only perform another functional study if he develops symptoms.  2. Dyslipidemia.  In the setting of his coronary artery disease and      diabetes our goal LDL should be less than 70.  As per the suggestion of      Dr. Hetty Ely I agree with increasing his atorvastatin to 20 mg daily.      I wrote a prescription for this today and I would like him to have his      cholesterol rechecked at his next office visit with Dr. Hetty Ely in      approximately three months with LFTs at that time as well.  3. Type 2 diabetes managed with oral hypoglycemics.  4. Hypertension under good control with anti-hypertensive therapy.  5. Followup.  We  will plan on seeing [name] back in one year or      sooner if the need arises.                                  Micheline Chapman, MD    MDC/MedQ  DD:  09/25/2005  DT:  09/26/2005  Job #:  [id number]   cc:   Arta Silence, MD

## 2010-06-24 NOTE — H&P (Signed)
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   DATE OF BIRTH:  December 19, 1945   DATE OF ADMISSION:  08/23/2004  DATE OF DISCHARGE:                                HISTORY & PHYSICAL   PRIMARY CARE PHYSICIAN:  Dr. Hetty Ely at [facility].   PRIMARY CARDIOLOGIST:  Dr. Andee Lineman   SUMMARY OF HISTORY:  Antonio [name] is a 65 year old white male who presents to  [facility] Emergency Room complaining of chest discomfort.  He states he was  in his usual good health until yesterday while working outside from 7 a.m.  throughout the day and into the evening.  He had several episodes of  anterior and left-sided chest discomfort.  He described sharp pain as well  as pressures that would occur separately.  These discomforts did not  radiate.  He can not describe the duration of the discomforts.  He continued  to work despite the discomforts.  He described waxing and waning sensation  of both discomforts from a 1 to an 8 based on a scale of 0-10 throughout the  day.  He can not describe any specific aggravating or alleviating factors.  He did mention that he stopped to rest and drink water the discomfort would  subside somewhat.  He does not have nitroglycerin.  After arriving home he  continued to work in the yard with the same feelings.  He stated toward late  yesterday afternoon he also noticed some shortness of breath and nausea.  He  denies any vomiting.  He describes sweating but he attributed this from  work.  He states that he always maintained hydration and he did not feel  that this was an issue.  He was unable to sleep last night.  He was not  having any discomfort but he can not describe as to why he could not sleep.  Around 7 a.m. this morning he again returned to work and went into [employer] office and sat down in the  air conditioning to work on some  books and he became very shaky as well as sweating associated with chest  pressure.  He felt that these feelings were different from yesterday's  occurrences.  He called his wife and he drove them to the hospital.  Upon  arrival his wife states that he was having discomfort, however, he has not  received anything for discomfort since his admission to the emergency room.  He states that some time after his arrival his discomfort resolved.  He can  not describe the duration of the discomfort this morning.   PAST MEDICAL HISTORY:  No known drug allergies.  However, he states that he  is sensitive to CLOROX.   MEDICATIONS:  (Per the wife)  1.  Aspirin 325 mg daily  2.  Lopressor 25 mg half tablet b.i.d.  3.  Tricor 145 daily.  4.  Altace 5 daily.  5.  Actos 45  daily.  6.  Lipitor 10 q.h.s.  7.  Fish oil 1000 mg two tablets daily.  8.  Metformin 500 mg b.i.d.  9.  Glucovance 5/500 mg b.i.d.  10. Byetta insulin 10 mcg b.i.d.   His history is notable for hyperlipidemia.  Last lipid check was in May 2006  which showed a total cholesterol 124, triglycerides 94, HDL 30, LDL 75.  Triglycerides approximately three years ago were over 900.  He has a history  of insulin-dependent diabetes.  Last hemoglobin A1C in April 2006 was 6.3.  History of hypertension.  He was hospitalized in January 2001 with prolonged  atypical chest discomfort.  A cardiac catheterization showed a 50-60% ostial  diagonal.  His LAD was noted to be calcified and there was the possibility  of a prior ruptured plaque with residual thrombus.  He was treated  medically.  Post procedure Persantine Cardiolite in March 2001 showed an EF  of 68%, no ischemia.  His last echocardiogram in June 2003 showed normal EF.  His surgical history is notable for right knee arthroscopy in 2000, back  surgery, colon polyps.  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   SOCIAL  HISTORY:  He resides in [place] with his wife of 14 years.  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  He has three  children, three step-children, two grandchildren, and no great-  grandchildren.  He smoked off and on for over 40 years.  Recently, he  resumed smoking less than a half a pack per day this past April.  Prior to  that he had not been smoking for four years.  He drinks occasional whiskey  on holidays.  Denies any drug or herbal medication use.  He does follow an  ADA low fat diet.  He does not exercise.   FAMILY HISTORY:  His mother is alive and well at the age of 21.  Father  unknown history.  He has one sister 59, alive and well.   REVIEW OF SYSTEMS:  Notable for a 30 pound weight loss since January 2006  secondary to diet and Byetta, headaches, reading glasses, partials, urinary  frequency and straining with occasional hematuria in addition to the above.   PHYSICAL EXAMINATION:  GENERAL:  Well-nourished, well-developed pleasant  white male that has a very affect.  VITAL SIGNS:  Temperature 93, blood pressure 140/80 on admission, now  119/61, pulse 80, respirations 20, 96% saturation on room air.  HEENT:  Essentially unremarkable.  NECK:  Supple without thyromegaly, adenopathy, JVD, or carotid bruits.  CHEST:  Symmetrical excursion.  Clear to auscultation without rales,  rhonchi, or wheezing.  HEART:  PMI is not displaced without lifts, heaves, or thrills.  Regular  rate and rhythm with a normal S1-S2.  No murmurs, rubs, clicks, or gallops.  All pulses were symmetrical and intact.  SKIN/INTEGUMENT:  Intact.  ABDOMEN:  Slightly obese.  Bowel sounds present, soft without organomegaly,  masses, or tenderness.  EXTREMITIES:  Negative cyanosis, clubbing, or edema.  It is noted that his  left lower extremity is slightly shorter and smaller in diameter compared to the right secondary to history of club foot with some deformity in his left  foot.   NEUROLOGICAL:  Intact.   LABORATORIES:  Chest x-ray showed mild chronic bronchitic changes with mild  chronic interstitial lung disease.  EKG shows normal sinus rhythm, normal  axis with a rate of 77.  PR interval  was 217.  EKG essentially remains  unchanged from January 2004 except for the first-degree block.  H&H is 15.7  and 46, normal indices.  WBC 9, platelets 340.  Sodium 143, potassium 3.5,  BUN 17, creatinine 1, glucose 110.  Normal LFTs.  PTT 28, PT 12.3.  Magnesium 1.9. Urinalysis showed positive rbc's.   IMPRESSION:  1.  Prolonged, somewhat atypical chest discomfort of uncertain etiology.      Initial ER markers x2 and EKG was negative for myocardial infarction.  2.  Hypertension.  3.  Tobacco use.  4.  Hematuria.  5.  History as previously.   DISPOSITION:  Dr. Antoine Poche reviewed the patient's history, spoken, and  examined the patient and agree with above.  We will admit the patient,  continue his home medications, and rule out for myocardial infarction.  If  he does rule out for myocardial infarction I have arranged for an adenosine  Myoview to be performed in the office on August 24, 2004 at 12 o'clock.  He  will be discharged to make this appointment and we will leave in his saline  lock.  If enzymes are abnormal for myocardial infarction he will undergo  cardiac catheterization.  I have also provided him on information in regards  to smoking cessation, stress testing, cholesterol, and the possibility of  cardiac catheterization.  If patient is discharged home in addition to his  cardiac follow-up he will need a return appointment with Dr. Hetty Ely to  further evaluate his hematuria and to continue his risk factor modification.      Irving Burton   EW/MEDQ  D:  08/23/2004  T:  08/23/2004  Job:  [id number]   cc:   Learta Codding, M.D. [facility]  [address]  [address]  [place]  [place] [address]   Laurita Quint, M.D.  [address] [place]  [place] [address]  Fax:  [phone]

## 2010-06-24 NOTE — Discharge Summary (Signed)
NAMEMarland Kitchen  Antonio, [name] NO.:  [id number]   MEDICAL RECORD NO.:  [id number]          PATIENT TYPE:  INP   LOCATION:  4709                         FACILITY:  [facility]   PHYSICIAN:  Rollene Rotunda, M.D.   DATE OF BIRTH:  10/08/1945   DATE OF ADMISSION:  08/23/2004  DATE OF DISCHARGE:  08/24/2004                                 DISCHARGE SUMMARY   PHYSICIANS:  Primary cardiologist: Learta Codding, M.D.  Primary care physician:  Laurita Quint, M.D.   DISCHARGE DIAGNOSES:  Chest pain, atypical.  Negative cardiac enzymes.  Electrocardiogram without ST or T wave changes.   PAST MEDICAL HISTORY:  1.  Hyperlipidemia.  2.  Insulin-dependent diabetes.  3.  Coronary artery disease status post cardiac catheterization in January      2001 showing 50 to 60% ostial diagonal.  His LAD was noted to be      calcified.  There was possibility of prior ruptured plaque from residual      thrombus.  The patient was treated medically at that time.      Postprocedure Persantine Cardiolite in March 2001 showed EF of 68%      without ischemia.  Echocardiogram June 2003, normal EF.   PAST SURGICAL HISTORY:  1.  Right knee arthroscopy in 2000.  2.  Back surgery.  3.  Coly polyp.   Remote history of tobacco use.   DISPOSITION:  The patient is being discharged home.   DIET:  As previously followed.   ACTIVITY:  As tolerated.   WOUND CARE:  Not applicable.   MEDICATIONS:  The patient is instructed to resume his previous medications.   Please note, patient has prescription medications that include:  1.  Metformin 500 mg p.o. b.i.d.  2.  Glucovance 5/500 mg p.o. b.i.d.   I have instructed the patient and his wife to get these two medications  clarified by his primary care physician who is the prescribing doctor.   Other medications he is to resume from previously include:  1.  Aspirin 325 mg daily.  2.  Lopressor 12.5 mg b.i.d.  3.  TriCor 140 mg daily.  4.  Altace 5 mg daily.  5.  Actos 45 mg daily.  6.  Lipitor 10 mg daily.  7.  Fish oil as previously taking by him.  8.  Insulin as prescribed by primary care physician.   The patient is to follow up with Dr. Francesca Jewett of the physician extenders on  July 28 at 2:30 to discuss results of his stress test and post  hospitalization visit.  I have scheduled patient for a gaited stress test in  our office today at 12 noon.  The patient may need to switch to adenosine  Myoview if unable to walk on treadmill.   HOSPITAL COURSE:  Antonio [name] is a very pleasant 65 year old Caucasian  gentleman with known history of coronary artery disease and as of 2001  negative stress test.  Since that time, he presented this admission with  complaints of chest discomfort, several episodes of anterior and left-sided  chest discomfort described  as a sharp pain as well as pressure that occurred  separately on presentation to [facility] Emergency Room.  Initial EKG  without ST or T wave changes.  Point-of-care enzymes negative; however, with  patient's multiple risk factors, he was felt to be admitted for observation  and cycle cardiac enzymes.  Patient without further episodes of chest  discomfort. Dr. Antoine Poche in to see patient prior to discharge.  Arrangements  made for patient to proceed with stress test in our office today.  Patient  being discharged from [facility] with IV intact for stress test this a.m.      Mic   MB/MEDQ  D:  08/24/2004  T:  08/24/2004  Job:  [id number]   cc:   Learta Codding, M.D. [facility]  [address]  [address]  [place]  [place] [address]   Laurita Quint, M.D.  [address] [place]  [place] [address]  Fax: [phone]

## 2010-06-24 NOTE — Discharge Summary (Signed)
[facility]  Patient:    Antonio [name], Antonio [name] Visit Number: [id number] MRN: [id number]          Service Type: MED Location: [phone] Attending Physician:  Tresa Garter Dictated by:   Cornell Barman, P.A. Admit Date:  07/29/2001 Discharge Date: 08/02/2001   CC:         Dr. [name] Office   Discharge Summary  DISCHARGE DIAGNOSES: Fever of unknown origin.  HISTORY OF PRESENT ILLNESS: Antonio [name] is a 65 year old white male who presented with fever and worsening headache. The patient was seen on Thursday, July 25, 2001. At that time, he was instructed to rest and take fluids. He felt well until he visited with family and then he began feeling weak and came to the [facility] emergency room. The patient received one liter of fluids and prescription for Tequin. He took one dose. The next day, he woke up with a severe headache.  PAST MEDICAL HISTORY: 1. Insulin dependent diabetes mellitus times five years. 2. Hyperlipidemia. 3. Hypertension.  HOSPITAL COURSE: #1 - INFECTIOUS DISEASE: The patient presented with fever and leukopenia. White count was 3.9. The patient was empirically started on IV Tequin. Workup was unremarkable including a urinalysis that was negative. Blood cultures were negative. LP was negative. Chest x-ray was negative. Head CT was negative for any sinusitis or meningitis. The patient did have minimally elevated LFTs and therefore, we were concerned about a biliary source. The patient had an abdominal ultrasound that showed fatty infiltration but no gallbladder or bile duct abnormality. Hepatitis serologies were also unremarkable. At that point, I recommended an Infectious Disease consult. Dr. Ninetta Lights saw the patient and recommended proceeding with CT of the abdomen and chest. A CT of the chest was unremarkable. CT of the abdomen revealed nonspecific 2.5 cm left adrenal lesion, question of adenoma and CT of  the pelvis which revealed only a mildly enlarged prostate. Dr. Ninetta Lights also recommended checking a Chad Nile serology, Malachi Carl virus, CMV, Toxoplasma, rheumatoid factor, sed rate, ANA, and PPD. A 2-D echo was also obtained and rule out any vegetations. The patients CMV was negative. Malaria smear was negative for Chad Nile virus and Epstein virus was still pending, however, the fever had resolved with five days of Tequin. Again, etiology of his fever was never truly discovered but he was discharged afebrile.  #2 - ADULT ONSET DIABETES MELLITUS: We did make some adjustments in his tests.  DIAGNOSTIC STUDIES: A 2-D echo revealed preserved LV function with an EF of 55-65% without any wall motion abnormalities or vegetations. Abdominal ultrasound revealed a fatty infiltration of the liver. A CT of the abdomen and chest was unremarkable.  LABORATORY DATA: White count was 6.1, hemoglobin 12.1, sed rate 83. Hemoglobin A1C was 8.4%. AST was 46, ALT 82, LDH 369, hepatitis serologies negative. Urinalysis was negative. Blood cultures were negative times two. CSF culture was negative. Malaria prep negative for plasma. Rheumatoid factor was less than 20. Toxoplasma was negative. CMV was negative. Malachi Carl virus was negative. West Nile virus negative.  DISCHARGE MEDICATIONS: 1. Altace 2.5 mg q.d. 2. Actos 45 mg q.d. 3. Lantus 35 units at bedtime. 4. Lopressor 25 mg b.i.d. 5. Tricor 160 mg q.d. 6. Glucovance 5/500 two b.i.d. 7. Tequin 400 mg q.d. for nine days.  FOLLOW-UP: With Dr. Jillyn Hidden in one to two weeks. Dictated by:   Cornell Barman, P.A. Attending Physician:  Tresa Garter DD:  08/21/01 TD:  08/26/01  Job: [id number]

## 2010-10-27 ENCOUNTER — Encounter: Payer: Self-pay | Admitting: Family Medicine

## 2010-10-27 ENCOUNTER — Ambulatory Visit (INDEPENDENT_AMBULATORY_CARE_PROVIDER_SITE_OTHER): Payer: BLUE CROSS/BLUE SHIELD | Admitting: Family Medicine

## 2010-10-27 DIAGNOSIS — J40 Bronchitis, not specified as acute or chronic: Secondary | ICD-10-CM

## 2010-10-27 DIAGNOSIS — Z87891 Personal history of nicotine dependence: Secondary | ICD-10-CM

## 2010-10-27 MED ORDER — AZITHROMYCIN 250 MG PO TABS: ORAL_TABLET | ORAL | Status: AC

## 2010-10-27 MED ORDER — CHLORPHENIRAMINE-HYDROCODONE 8-10 MG/5ML PO LQCR: 5.0000 mL | Freq: Two times a day (BID) | ORAL | Status: DC | PRN

## 2010-10-27 MED ORDER — DEXTROMETHORPHAN POLISTIREX 30 MG/5ML PO LQCR: 60.0000 mg | ORAL | Status: AC | PRN

## 2010-10-27 NOTE — Progress Notes (Signed)
  Subjective:    Patient ID: Antonio [name], male    DOB: 1945-02-26, 65 y.o.   MRN: [id number]  HPI The pt is a former pt of mine last seen 11/2 years ago by Dr Dayton Martes. He was to follow up with her in two months but did not. He is here today for URI. He has lots of chest congestion for the last three nights. It causes him to cough a lot. He has tried mucous control, Mucinex, Daycare, Tyl Cold.  He has not had fever or chills. He smokes 1-11/2 packs a day. He has minimal headaches occas, none the last three days, no ear, some rhinitis that is white, no ST, cough productive of white to yellow discharge. He has not had N/V, belly pain but has soreness in the chest wall area from coughing a lot, no diarrhea. He also finally realizes he needs to quit smoking. He probably wouln't have this bronchitis if it weren't for the smoking putting him at risk.     Review of SystemsNoncontributory except as above.       Objective:   Physical Exam  Constitutional: He appears well-developed and well-nourished. No distress.  HENT:  Head: Normocephalic and atraumatic.  Right Ear: External ear normal.  Left Ear: External ear normal.  Nose: Nose normal.  Mouth/Throat: Oropharynx is clear and moist.  Eyes: Conjunctivae and EOM are normal. Pupils are equal, round, and reactive to light. Right eye exhibits no discharge. Left eye exhibits no discharge.  Neck: Normal range of motion. Neck supple.  Cardiovascular: Normal rate and regular rhythm.   Pulmonary/Chest: Effort normal and breath sounds normal. No respiratory distress. He has no wheezes. He has no rales. Tenderness: mild chest wall discomfort with palpation due to prolonged coughing.  Lymphadenopathy:    He has no cervical adenopathy.  Skin: He is not diaphoretic.          Assessment & Plan:

## 2010-10-27 NOTE — Patient Instructions (Signed)
Take Zithromax. Take Guaifenesin (400mg ), take 11/2 tabs by mouth AM and NOON. Get GUAIFENESIN by  going to [facility] or [facility] and getting MUCOUS RELIEF EXPECTORANT/CONGESTION. DO NOT GET MUCINEX (Timed Release Guaifenesin)  Drink lots of fluids. Keep lozenge in mouth all day long. Take Delsym during the day as needed. Take Tessalon at night as needed for cough, only at night.   Try the patch. Try either one. They are both three strengths. 21/14/7 or 15/10/5 Use higher dose once a day for one month , the medium dose for two weeks and the lowest dose for two more weeks. Have strategies for interrupting habit pattern of smoking.

## 2010-10-27 NOTE — Assessment & Plan Note (Signed)
Wants to quit. Can't afford Chantix. Try the patch and habit interruption. See instructions.

## 2010-10-27 NOTE — Assessment & Plan Note (Signed)
See instructions. Needs to quit smoking.

## 2010-11-15 LAB — COMPREHENSIVE METABOLIC PANEL
ALT: 14
Albumin: 4.2
Alkaline Phosphatase: 41
Potassium: 3.5
Sodium: 136
Total Protein: 6.9

## 2010-11-15 LAB — CBC
Platelets: 274
RDW: 13.8

## 2010-11-15 LAB — URINE MICROSCOPIC-ADD ON

## 2010-11-15 LAB — URINALYSIS, ROUTINE W REFLEX MICROSCOPIC
Glucose, UA: NEGATIVE
Leukocytes, UA: NEGATIVE
Nitrite: NEGATIVE
Specific Gravity, Urine: 1.012
pH: 5.5

## 2010-11-15 LAB — DIFFERENTIAL
Basophils Relative: 0
Eosinophils Absolute: 0 — ABNORMAL LOW
Lymphs Abs: 1.3
Monocytes Absolute: 0.9
Monocytes Relative: 6
Neutro Abs: 13.2 — ABNORMAL HIGH

## 2011-04-14 ENCOUNTER — Encounter: Payer: Self-pay | Admitting: Gastroenterology

## 2011-04-24 ENCOUNTER — Encounter: Payer: Self-pay | Admitting: Family Medicine

## 2011-04-24 ENCOUNTER — Ambulatory Visit (INDEPENDENT_AMBULATORY_CARE_PROVIDER_SITE_OTHER): Payer: Medicare Other | Admitting: Family Medicine

## 2011-04-24 VITALS — BP 150/84 | HR 80 | Temp 98.1°F | Wt 157.0 lb

## 2011-04-24 DIAGNOSIS — M549 Dorsalgia, unspecified: Secondary | ICD-10-CM | POA: Diagnosis not present

## 2011-04-24 DIAGNOSIS — E119 Type 2 diabetes mellitus without complications: Secondary | ICD-10-CM

## 2011-04-24 DIAGNOSIS — R972 Elevated prostate specific antigen [PSA]: Secondary | ICD-10-CM

## 2011-04-24 DIAGNOSIS — E785 Hyperlipidemia, unspecified: Secondary | ICD-10-CM

## 2011-04-24 DIAGNOSIS — I10 Essential (primary) hypertension: Secondary | ICD-10-CM | POA: Diagnosis not present

## 2011-04-24 DIAGNOSIS — R634 Abnormal weight loss: Secondary | ICD-10-CM | POA: Insufficient documentation

## 2011-04-24 LAB — POCT URINALYSIS DIPSTICK
Blood, UA: NEGATIVE
Nitrite, UA: NEGATIVE
Protein, UA: NEGATIVE
Spec Grav, UA: <=1.005
Urobilinogen, UA: NEGATIVE
pH, UA: 6.5

## 2011-04-24 MED ORDER — RAMIPRIL 10 MG PO TABS: 10.0000 mg | ORAL_TABLET | Freq: Every day | ORAL | Status: DC

## 2011-04-24 MED ORDER — FENOFIBRATE 145 MG PO TABS: 145.0000 mg | ORAL_TABLET | Freq: Every day | ORAL | Status: DC

## 2011-04-24 MED ORDER — METFORMIN HCL 500 MG PO TABS: 1000.0000 mg | ORAL_TABLET | Freq: Two times a day (BID) | ORAL | Status: DC

## 2011-04-24 MED ORDER — INSULIN DETEMIR 100 UNIT/ML ~~LOC~~ SOLN: 18.0000 [IU] | Freq: Every day | SUBCUTANEOUS | Status: DC

## 2011-04-24 MED ORDER — METOPROLOL TARTRATE 50 MG PO TABS: ORAL_TABLET | ORAL | Status: DC

## 2011-04-24 MED ORDER — PIOGLITAZONE HCL 45 MG PO TABS: 45.0000 mg | ORAL_TABLET | Freq: Every day | ORAL | Status: DC

## 2011-04-24 MED ORDER — ATORVASTATIN CALCIUM 20 MG PO TABS: 20.0000 mg | ORAL_TABLET | Freq: Every day | ORAL | Status: DC

## 2011-04-24 NOTE — Progress Notes (Signed)
SUBJECTIVE:  Antonio [name] is a 66 y.o. male who complains of an injury causing low back pain 1 week(s) ago. The pain is positional with bending or lifting, without radiation down the legs. 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 Symptoms have been intermittent since that time. Prior history of back problems: recurrent self limited episodes of low back pain in the past. There is no numbness in the legs.  Of note, has not been taking his diabetes medication and has not been following up.  Supposed to be Metformin 1000 mg twice daily, Actos and levimir (last follow up was 04/2009 when he established care with me).  Does not want to restart Levimir.  Also not taking any of his other medication, including Altace, Tricor and Lipitor.  His wife has advanced Alzheimers and he has been caring for her. He was also worried about paying for his wife's medications.  Now that he is on Medicare he can afford them again and is willing to take them. Lab Results  Component Value Date   HGBA1C 12.4* 04/20/2009   Has lost significant amount weight. Patient Active Problem List  Diagnoses  . DIABETES MELLITUS, TYPE II  . HYPERLIPIDEMIA  . HYPERTENSION, BENIGN ESSENTIAL  . CORONARY ARTERY DISEASE  . HEMORRHOIDS, INTERNAL  . URI  . ESOPHAGEAL STRICTURE  . GASTRITIS  . LOW BACK PAIN, CHRONIC  . PSA, INCREASED  . BURN, FIRST DEGREE, FINGERS  . BURN, SECOND DEGREE, HAND  . TOBACCO ABUSE, HX OF  . Bronchitis  . Back pain   Past Medical History  Diagnosis Date  . Hypertension   . Hyperlipidemia   . Diabetes mellitus     Type II  . Gastritis   . Esophageal stricture   . URI (upper respiratory infection)   . PSA elevation   . Internal hemorrhoids without mention of complication   . Lumbago   . Tobacco abuse   . Coronary atherosclerosis of unspecified type of vessel, native or graft   . Hx of colonic polyps    Past Surgical History  Procedure Date  . Knee arthroscopy 10/00   Right  . Stress myoview 07/06    EF 67%  . 2d echo 06/03  . Ct of head  and sinuses 06/03    Negative  . Lp 06/03  . Abdominal ultrasound 06/03    Negative  . Ct of the chest, abdomen, pelvis 06/03    Chest negative; Abdomen, left adrenal lesion; Pelvis, enlarged prostate  . Persantine cardiolite 03/01    EF 68%  . Esophagogastroduodenoscopy 02/13/07    gastritis and duodenitis without bleed  . Microwave thermotherapy prostate 08/28/07    Artis Flock  . Polypectomy 11/95   History  Substance Use Topics  . Smoking status: Current Everyday Smoker -- 1.0 packs/day for 40 years    Types: Cigarettes  . Smokeless tobacco: Not on file  . Alcohol Use: Not on file   Family History  Problem Relation Age of Onset  . Diabetes Father   . Alcohol abuse Paternal Uncle   . Heart disease Neg Hx   . Stroke Neg Hx   . Cancer Neg Hx   . Drug abuse Neg Hx   . Depression Neg Hx   . Alcohol abuse Paternal Uncle   . Alcohol abuse Paternal Uncle    No Known Allergies Current Outpatient Prescriptions on File Prior to Visit  Medication Sig Dispense Refill  . aspirin 325 MG tablet Take 325  mg by mouth daily.        [name] atorvastatin (LIPITOR) 20 MG tablet Take 20 mg by mouth daily.        . chlorpheniramine-hydrocodone (TUSSIONEX) 8-10 MG/5ML suspension Take 5 mLs by mouth every 12 (twelve) hours as needed for cough.  45 mL  0  . fenofibrate (TRICOR) 145 MG tablet Take 145 mg by mouth daily.        . fish oil-omega-3 fatty acids 1000 MG capsule Take 2 g by mouth daily.        . insulin detemir (LEVEMIR) 100 UNIT/ML injection Inject 18 Units into the skin at bedtime.        . metFORMIN (GLUCOPHAGE) 500 MG tablet Take 1,000 mg by mouth 2 (two) times daily with a meal.        . metoprolol (LOPRESSOR) 50 MG tablet Take 1/2 tablet by mouth twice daily.       . pioglitazone (ACTOS) 45 MG tablet Take 45 mg by mouth daily.        . ramipril (ALTACE) 10 MG tablet Take 10 mg by mouth daily.         The PMH, PSH,  Social History, Family History, Medications, and allergies have been reviewed in [facility], and have been updated if relevant.  ROS: Pos increased thirst and urination. No CP  No SOB  OBJECTIVE: BP 150/84  Pulse 80  Temp(Src) 98.1 F (36.7 C) (Oral)  Wt 157 lb (71.215 kg) Wt Readings from Last 3 Encounters:  04/24/11 157 lb (71.215 kg)  10/27/10 163 lb 12.8 oz (74.299 kg)  12/24/08 201 lb (91.173 kg)     Vital signs as noted above. Patient appears to be in mild to moderate pain, antalgic gait noted. Lumbosacral spine area reveals no local tenderness or mass. Painful and reduced LS ROM noted. Straight leg raise is negative bilaterally, DTR's, motor strength and sensation normal, including heel and toe gait.  Peripheral pulses are palpable.   UA pos for 2000 glucose, otherwise neg.   Assessment and Plan: 1. Back pain  Lumbar strain-  For acute pain, rest, intermittent application of cold packs (later, may switch to heat, but do not sleep on heating pad), analgesics and muscle relaxants are recommended. Discussed longer term treatment plan of prn NSAID's and discussed a home back care exercise program with flexion exercise routine. Proper lifting with avoidance of heavy lifting discussed. Consider Physical Therapy and XRay studies if not improving. Call or return to clinic prn if these symptoms worsen or fail to improve as anticipated. POCT urinalysis dipstick  2. DIABETES MELLITUS, TYPE II  Deteriorated. Had a lengthy discussion about importance of restarting medication- this is likely part of his weight loss. HgB A1c  3. HYPERLIPIDEMIA  Lipid Panel  4. HYPERTENSION, BENIGN ESSENTIAL  Restart meds, check labs today. Comprehensive metabolic panel  5. Weight loss, abnormal   New- likely related to poorly controlled DM and not taking care of himself given his wife's illness. Will check labs today. Pt to follow up in 2 weeks.

## 2011-04-24 NOTE — Patient Instructions (Addendum)
Please restart your medication. Please apply heat to your back. We will call you with your lab results. Please follow up in 2 weeks.

## 2011-04-25 ENCOUNTER — Other Ambulatory Visit: Payer: Self-pay | Admitting: Family Medicine

## 2011-04-25 ENCOUNTER — Encounter (HOSPITAL_COMMUNITY): Payer: Self-pay | Admitting: Emergency Medicine

## 2011-04-25 ENCOUNTER — Telehealth: Payer: Self-pay | Admitting: Radiology

## 2011-04-25 ENCOUNTER — Emergency Department (HOSPITAL_COMMUNITY)
Admission: EM | Admit: 2011-04-25 | Discharge: 2011-04-25 | Disposition: A | Discharge status: Home / Self Care | Payer: Medicare Other | Attending: Emergency Medicine | Admitting: Emergency Medicine

## 2011-04-25 DIAGNOSIS — Z9114 Patient's other noncompliance with medication regimen: Secondary | ICD-10-CM

## 2011-04-25 DIAGNOSIS — E785 Hyperlipidemia, unspecified: Secondary | ICD-10-CM | POA: Diagnosis not present

## 2011-04-25 DIAGNOSIS — E119 Type 2 diabetes mellitus without complications: Secondary | ICD-10-CM | POA: Diagnosis not present

## 2011-04-25 DIAGNOSIS — I10 Essential (primary) hypertension: Secondary | ICD-10-CM | POA: Insufficient documentation

## 2011-04-25 DIAGNOSIS — R739 Hyperglycemia, unspecified: Secondary | ICD-10-CM

## 2011-04-25 DIAGNOSIS — E1169 Type 2 diabetes mellitus with other specified complication: Secondary | ICD-10-CM | POA: Insufficient documentation

## 2011-04-25 LAB — DIFFERENTIAL
Basophils Absolute: 0.1 10*3/uL (ref 0.0–0.1)
Basophils Relative: 1 % (ref 0–1)
Eosinophils Relative: 2 % (ref 0–5)
Lymphocytes Relative: 31 % (ref 12–46)
Monocytes Absolute: 0.5 10*3/uL (ref 0.1–1.0)
Monocytes Relative: 5 % (ref 3–12)
Neutro Abs: 6.1 10*3/uL (ref 1.7–7.7)

## 2011-04-25 LAB — CBC WITH DIFFERENTIAL/PLATELET
Basophils Absolute: 0 10*3/uL (ref 0.0–0.1)
Basophils Relative: 0.3 % (ref 0.0–3.0)
Eosinophils Absolute: 0.1 10*3/uL (ref 0.0–0.7)
HCT: 51.8 % (ref 39.0–52.0)
Hemoglobin: 17.6 g/dL — ABNORMAL HIGH (ref 13.0–17.0)
Lymphocytes Relative: 19.2 % (ref 12.0–46.0)
Lymphs Abs: 1.7 10*3/uL (ref 0.7–4.0)
MCHC: 33.9 g/dL (ref 30.0–36.0)
MCV: 92.8 fl (ref 78.0–100.0)
Monocytes Absolute: 0.5 10*3/uL (ref 0.1–1.0)
Neutro Abs: 6.4 10*3/uL (ref 1.4–7.7)
RBC: 5.58 Mil/uL (ref 4.22–5.81)
RDW: 13.2 % (ref 11.5–14.6)

## 2011-04-25 LAB — COMPREHENSIVE METABOLIC PANEL
Albumin: 3.6 g/dL (ref 3.5–5.2)
Alkaline Phosphatase: 107 U/L (ref 39–117)
BUN: 13 mg/dL (ref 6–23)
CO2: 26 mEq/L (ref 19–32)
GFR: 97.07 mL/min (ref >60.00)
Glucose, Bld: 695 mg/dL (ref 70–99)
Potassium: 4.2 mEq/L (ref 3.5–5.1)

## 2011-04-25 LAB — LIPID PANEL
Cholesterol: 236 mg/dL — ABNORMAL HIGH (ref 0–200)
Total CHOL/HDL Ratio: 6
Triglycerides: 739 mg/dL — ABNORMAL HIGH (ref 0.0–149.0)

## 2011-04-25 LAB — BASIC METABOLIC PANEL
BUN: 11 mg/dL (ref 6–23)
CO2: 25 mEq/L (ref 19–32)
Calcium: 9.4 mg/dL (ref 8.4–10.5)
Chloride: 97 mEq/L (ref 96–112)
Creatinine, Ser: 0.48 mg/dL — ABNORMAL LOW (ref 0.50–1.35)

## 2011-04-25 LAB — CBC
HCT: 50.7 % (ref 39.0–52.0)
Hemoglobin: 18.7 g/dL — ABNORMAL HIGH (ref 13.0–17.0)
MCHC: 36.9 g/dL — ABNORMAL HIGH (ref 30.0–36.0)
MCV: 86.5 fL (ref 78.0–100.0)
RDW: 12.8 % (ref 11.5–15.5)

## 2011-04-25 LAB — GLUCOSE, CAPILLARY: Glucose-Capillary: 422 mg/dL — ABNORMAL HIGH (ref 70–99)

## 2011-04-25 LAB — LDL CHOLESTEROL, DIRECT: Direct LDL: 120.3 mg/dL

## 2011-04-25 NOTE — ED Provider Notes (Signed)
History     CSN: [id number]  Arrival date & time 04/25/11  1455   First MD Initiated Contact with Patient 04/25/11 2038      Chief Complaint  Patient presents with  . Hyperglycemia    (Consider location/radiation/quality/duration/timing/severity/associated sxs/prior treatment) HPI Comments: Patient saws his PCP yesterday.  Had lab work drawn.  He was written prescriptions for those pill and insulin to control his elevated blood glucose levels.  He was again called by the doctor's office, who told him he needed to go immediately to the hospital because his sugar was high.  He, unfortunately had not had a chance to pick up his prescriptions at has not taken any of his medications today.  He states he has not taken his medicines in over a year because he was unemployed and his wife was ill and he was paying for her medications and let his own health go by the wayside, he now has insurance and can't afford to pay for both his and his wife's medication.  He has no complaints at this time of polydipsia, polyuria, weakness, dizziness, chest pain, short of breath, myalgias.  The history is provided by the patient.    Past Medical History  Diagnosis Date  . Hypertension   . Hyperlipidemia   . Diabetes mellitus     Type II  . Gastritis   . Esophageal stricture   . URI (upper respiratory infection)   . PSA elevation   . Internal hemorrhoids without mention of complication   . Lumbago   . Tobacco abuse   . Coronary atherosclerosis of unspecified type of vessel, native or graft   . Hx of colonic polyps     Past Surgical History  Procedure Date  . Knee arthroscopy 10/00    Right  . Stress myoview 07/06    EF 67%  . 2d echo 06/03  . Ct of head  and sinuses 06/03    Negative  . Lp 06/03  . Abdominal ultrasound 06/03    Negative  . Ct of the chest, abdomen, pelvis 06/03    Chest negative; Abdomen, left adrenal lesion; Pelvis, enlarged prostate  . Persantine cardiolite 03/01    EF  68%  . Esophagogastroduodenoscopy 02/13/07    gastritis and duodenitis without bleed  . Microwave thermotherapy prostate 08/28/07    Artis Flock  . Polypectomy 11/95    Family History  Problem Relation Age of Onset  . Diabetes Father   . Alcohol abuse Paternal Uncle   . Heart disease Neg Hx   . Stroke Neg Hx   . Cancer Neg Hx   . Drug abuse Neg Hx   . Depression Neg Hx   . Alcohol abuse Paternal Uncle   . Alcohol abuse Paternal Uncle     History  Substance Use Topics  . Smoking status: Current Everyday Smoker -- 1.0 packs/day for 40 years    Types: Cigarettes  . Smokeless tobacco: Not on file  . Alcohol Use: No      Review of Systems  Constitutional: Negative for fever and appetite change.  HENT: Negative for congestion.   Respiratory: Negative for shortness of breath.   Cardiovascular: Negative for chest pain and leg swelling.  Genitourinary: Negative for dysuria and decreased urine volume.  Musculoskeletal: Negative for myalgias.  Neurological: Negative for dizziness, weakness, light-headedness, numbness and headaches.    Allergies  Review of patient's allergies indicates no known allergies.  Home Medications   Current Outpatient Rx  Name Route  Sig Dispense Refill  . IBUPROFEN 200 MG PO TABS Oral Take 200 mg by mouth every 6 (six) hours as needed. For back pain      BP 167/92  Pulse 78  Temp(Src) 98.4 F (36.9 C) (Oral)  Resp 19  SpO2 95%  Physical Exam  Constitutional: He is oriented to person, place, and time. He appears well-developed and well-nourished.  Neck: Normal range of motion.  Cardiovascular: Normal rate.   Pulmonary/Chest: Effort normal.  Musculoskeletal: Normal range of motion. He exhibits no tenderness.  Neurological: He is alert and oriented to person, place, and time.  Skin: Skin is warm and dry.    ED Course  Procedures (including critical care time)  Labs Reviewed  GLUCOSE, CAPILLARY - Abnormal; Notable for the following:     Glucose-Capillary 422 (*)    All other components within normal limits  CBC - Abnormal; Notable for the following:    RBC 5.86 (*)    Hemoglobin 18.7 (*)    MCHC 36.9 (*)    All other components within normal limits  BASIC METABOLIC PANEL - Abnormal; Notable for the following:    Sodium 133 (*)    Glucose, Bld 359 (*)    Creatinine, Ser 0.48 (*)    All other components within normal limits  GLUCOSE, CAPILLARY - Abnormal; Notable for the following:    Glucose-Capillary 304 (*)    All other components within normal limits  DIFFERENTIAL   No results found.   1. Hyperglycemia   2. H/O medication noncompliance       MDM  Hyperglycemia due to lack of medication use.  He now has prescriptions at his home that he will be advised to take on regular basis  Medical screening examination/treatment/procedure(s) were performed by non-physician practitioner and as supervising physician I was immediately available for consultation/collaboration. Osvaldo Human, M.D.       Arman Filter, NP 04/25/11 2056  Arman Filter, NP 04/25/11 2056  Carleene Cooper III, MD 04/26/11 [phone]

## 2011-04-25 NOTE — ED Notes (Signed)
Check patient blood sugar it was 422 notified RN Kim of blood sugar

## 2011-04-25 NOTE — ED Notes (Signed)
Pt st's he had lab work at MD's office yesterday. Was called today and told to come to ED ref. Hyperglycemia.  Pt st's he has not had any of his meds in over a yr because he could not afford it.  No complaints voiced

## 2011-04-25 NOTE — Telephone Encounter (Signed)
critical lab result of glu 600s brought to my attention by Terri.  This has been addressed by PCP.

## 2011-04-25 NOTE — Progress Notes (Signed)
Addended by: Alvina Chou on: 04/25/2011 10:05 AM   Modules accepted: Orders

## 2011-04-25 NOTE — ED Notes (Signed)
Patient here because PMD told him he was close to go into Diabetic coma.  Patient has prescriptions for diabetes at pharmacy, family is picking them up.

## 2011-04-25 NOTE — Discharge Instructions (Signed)
Diabetes, Frequently Asked Questions WHAT IS DIABETES? Most of the food we eat is turned into glucose (sugar). Our bodies use it for energy. The pancreas makes a hormone called insulin. It helps glucose get into the cells of our bodies. When you have diabetes, your body either does not make enough insulin or cannot use its own insulin as well as it should. This causes sugars to build up in your blood. WHAT ARE THE SYMPTOMS OF DIABETES?  Frequent urination.   Excessive thirst.   Unexplained weight loss.   Extreme hunger.   Blurred vision.   Tingling or numbness in hands or feet.   Feeling very tired much of the time.   Dry, itchy skin.   Sores that are slow to heal.   Yeast infections.  WHAT ARE THE TYPES OF DIABETES? Type 1 Diabetes   About 10% of affected people have this type.   Usually occurs before the age of 67.   Usually occurs in thin to normal weight people.  Type 2 Diabetes  About 90% of affected people have this type.   Usually occurs after the age of 67.   Usually occurs in overweight people.   More likely to have:   A family history of diabetes.   A history of diabetes during pregnancy (gestational diabetes).   High blood pressure.   High cholesterol and triglycerides.  Gestational Diabetes  Occurs in about 4% of pregnancies.   Usually goes away after the baby is born.   More likely to occur in women with:   Family history of diabetes.   Previous gestational diabetes.   Obese.   Over 67 years old.  WHAT IS PRE-DIABETES? Pre-diabetes means your blood glucose is higher than normal, but lower than the diabetes range. It also means you are at risk of getting type 2 diabetes and heart disease. If you are told you have pre-diabetes, have your blood glucose checked again in 1 to 2 years. WHAT IS THE TREATMENT FOR DIABETES? Treatment is aimed at keeping blood glucose near normal levels at all times. Learning how to manage this yourself is  important in treating diabetes. Depending on the type of diabetes you have, your treatment will include one or more of the following:  Monitoring your blood glucose.   Meal planning.   Exercise.   Oral medicine (pills) or insulin.  CAN DIABETES BE PREVENTED? With type 1 diabetes, prevention is more difficult, because the triggers that cause it are not yet known. With type 2 diabetes, prevention is more likely, with lifestyle changes:  Maintain a healthy weight.   Eat healthy.   Exercise.  IS THERE A CURE FOR DIABETES? No, there is no cure for diabetes. There is a lot of research going on that is looking for a cure, and progress is being made. Diabetes can be treated and controlled. People with diabetes can manage their diabetes and lead normal, active lives. SHOULD I BE TESTED FOR DIABETES? If you are at least 67 years old, you should be tested for diabetes. You should be tested again every 3 years. If you are 67 or older and overweight, you may want to get tested more often. If you are younger than 67, overweight, and have one or more of the following risk factors, you should be tested:  Family history of diabetes.   Inactive lifestyle.   High blood pressure.  WHAT ARE SOME OTHER SOURCES FOR INFORMATION ON DIABETES? The following organizations may help in your search for  more information on diabetes: National Diabetes Education Program (NDEP) Internet: [employer]Discussions[employer] American Diabetes Association Internet: http://www.diabetes.org  Juvenile Diabetes Foundation International Internet: [employer] Document Released: 01/26/2003 Document Revised: 01/12/2011 Document Reviewed: 11/20/2008 [facility] Patient Information 2012 [place], [place]. Tonight when you get home.  She should be taking your metformin 1000 mg and your 66 year and then tomorrow start the hole regime of metformin, Actos, and 11, nearly daily basis, plus the other medications that they have  prescribed for you please make an appointment for followup with your Dr. Clifton Custard as his request

## 2011-04-25 NOTE — Telephone Encounter (Signed)
Please disregard my previous result note for him and advise him to go the ER to get his sugar under immediate control.

## 2011-04-25 NOTE — Telephone Encounter (Signed)
Patient advised of result and on his way to [place]

## 2011-04-25 NOTE — Telephone Encounter (Signed)
[facility] called critical results, Glucose 695, A1C hgb 17

## 2011-04-28 DIAGNOSIS — F17200 Nicotine dependence, unspecified, uncomplicated: Secondary | ICD-10-CM | POA: Diagnosis not present

## 2011-08-08 ENCOUNTER — Encounter: Payer: Self-pay | Admitting: Family Medicine

## 2011-08-08 ENCOUNTER — Ambulatory Visit (INDEPENDENT_AMBULATORY_CARE_PROVIDER_SITE_OTHER): Payer: Medicare Other | Admitting: Family Medicine

## 2011-08-08 VITALS — BP 118/62 | HR 60 | Temp 97.9°F | Wt 151.0 lb

## 2011-08-08 DIAGNOSIS — R634 Abnormal weight loss: Secondary | ICD-10-CM | POA: Diagnosis not present

## 2011-08-08 DIAGNOSIS — R972 Elevated prostate specific antigen [PSA]: Secondary | ICD-10-CM

## 2011-08-08 DIAGNOSIS — M545 Low back pain, unspecified: Secondary | ICD-10-CM

## 2011-08-08 DIAGNOSIS — N138 Other obstructive and reflux uropathy: Secondary | ICD-10-CM | POA: Diagnosis not present

## 2011-08-08 DIAGNOSIS — N139 Obstructive and reflux uropathy, unspecified: Secondary | ICD-10-CM

## 2011-08-08 DIAGNOSIS — N401 Enlarged prostate with lower urinary tract symptoms: Secondary | ICD-10-CM | POA: Diagnosis not present

## 2011-08-08 LAB — PSA: PSA: 1.94 ng/mL (ref 0.10–4.00)

## 2011-08-08 LAB — CBC WITH DIFFERENTIAL/PLATELET
Basophils Relative: 0.6 % (ref 0.0–3.0)
Eosinophils Relative: 2 % (ref 0.0–5.0)
HCT: 47.1 % (ref 39.0–52.0)
Hemoglobin: 15.8 g/dL (ref 13.0–17.0)
Lymphocytes Relative: 23.3 % (ref 12.0–46.0)
Lymphs Abs: 2.3 10*3/uL (ref 0.7–4.0)
Monocytes Relative: 6.9 % (ref 3.0–12.0)
Neutro Abs: 6.6 10*3/uL (ref 1.4–7.7)
RBC: 5.14 Mil/uL (ref 4.22–5.81)

## 2011-08-08 LAB — COMPREHENSIVE METABOLIC PANEL
ALT: 10 U/L (ref 0–53)
BUN: 15 mg/dL (ref 6–23)
CO2: 28 mEq/L (ref 19–32)
Calcium: 9.6 mg/dL (ref 8.4–10.5)
Chloride: 103 mEq/L (ref 96–112)
Creatinine, Ser: 0.7 mg/dL (ref 0.4–1.5)
GFR: 117.75 mL/min (ref >60.00)
Glucose, Bld: 187 mg/dL — ABNORMAL HIGH (ref 70–99)
Total Bilirubin: 0.7 mg/dL (ref 0.3–1.2)

## 2011-08-08 LAB — POCT URINALYSIS DIPSTICK
Blood, UA: NEGATIVE
Glucose, UA: 250
Leukocytes, UA: NEGATIVE
Nitrite, UA: NEGATIVE
Urobilinogen, UA: NEGATIVE

## 2011-08-08 MED ORDER — FENOFIBRATE 145 MG PO TABS: 145.0000 mg | ORAL_TABLET | Freq: Every day | ORAL | Status: DC

## 2011-08-08 MED ORDER — TAMSULOSIN HCL 0.4 MG PO CAPS: 0.4000 mg | ORAL_CAPSULE | Freq: Every day | ORAL | Status: DC

## 2011-08-08 MED ORDER — METOPROLOL TARTRATE 50 MG PO TABS: ORAL_TABLET | ORAL | Status: DC

## 2011-08-08 MED ORDER — METFORMIN HCL 500 MG PO TABS: 1000.0000 mg | ORAL_TABLET | Freq: Two times a day (BID) | ORAL | Status: DC

## 2011-08-08 MED ORDER — RAMIPRIL 10 MG PO TABS: 10.0000 mg | ORAL_TABLET | Freq: Every day | ORAL | Status: DC

## 2011-08-08 MED ORDER — CIPROFLOXACIN HCL 500 MG PO TABS: 500.0000 mg | ORAL_TABLET | Freq: Two times a day (BID) | ORAL | Status: AC

## 2011-08-08 MED ORDER — INSULIN DETEMIR 100 UNIT/ML ~~LOC~~ SOLN: 18.0000 [IU] | Freq: Every day | SUBCUTANEOUS | Status: DC

## 2011-08-08 MED ORDER — ATORVASTATIN CALCIUM 20 MG PO TABS: 20.0000 mg | ORAL_TABLET | Freq: Every day | ORAL | Status: DC

## 2011-08-08 MED ORDER — PIOGLITAZONE HCL 45 MG PO TABS: 45.0000 mg | ORAL_TABLET | Freq: Every day | ORAL | Status: DC

## 2011-08-08 MED ORDER — ASPIRIN 325 MG PO TABS: 325.0000 mg | ORAL_TABLET | Freq: Every day | ORAL | Status: DC

## 2011-08-08 NOTE — Progress Notes (Signed)
Subjective:    Patient ID: Antonio [name], male    DOB: 1946/01/04, 66 y.o.   MRN: [id number]  HPI  66 yo here for pain across his lower back, perineum, difficulty starting his urinary stream/weak stream x 2 months, getting acutely worse over past two days. No fevers but having body aches.  Back on his insulin. CBGs good and seeing endocrinologist but he continues to loose weight.  Wt Readings from Last 3 Encounters:  08/08/11 151 lb (68.493 kg)  04/24/11 157 lb (71.215 kg)  10/27/10 163 lb 12.8 oz (74.299 kg)   No nausea or vomiting. No night sweats.  Patient Active Problem List  Diagnosis  . DIABETES MELLITUS, TYPE II  . HYPERLIPIDEMIA  . HYPERTENSION, BENIGN ESSENTIAL  . CORONARY ARTERY DISEASE  . HEMORRHOIDS, INTERNAL  . URI  . ESOPHAGEAL STRICTURE  . GASTRITIS  . LOW BACK PAIN, CHRONIC  . PSA, INCREASED  . BURN, FIRST DEGREE, FINGERS  . BURN, SECOND DEGREE, HAND  . TOBACCO ABUSE, HX OF  . Bronchitis  . Back pain  . Weight loss, abnormal   Past Medical History  Diagnosis Date  . Hypertension   . Hyperlipidemia   . Diabetes mellitus     Type II  . Gastritis   . Esophageal stricture   . URI (upper respiratory infection)   . PSA elevation   . Internal hemorrhoids without mention of complication   . Lumbago   . Tobacco abuse   . Coronary atherosclerosis of unspecified type of vessel, native or graft   . Hx of colonic polyps    Past Surgical History  Procedure Date  . Knee arthroscopy 10/00    Right  . Stress myoview 07/06    EF 67%  . 2d echo 06/03  . Ct of head  and sinuses 06/03    Negative  . Lp 06/03  . Abdominal ultrasound 06/03    Negative  . Ct of the chest, abdomen, pelvis 06/03    Chest negative; Abdomen, left adrenal lesion; Pelvis, enlarged prostate  . Persantine cardiolite 03/01    EF 68%  . Esophagogastroduodenoscopy 02/13/07    gastritis and duodenitis without bleed  . Microwave thermotherapy prostate 08/28/07    Artis Flock  .  Polypectomy 11/95   History  Substance Use Topics  . Smoking status: Current Everyday Smoker -- 1.0 packs/day for 40 years    Types: Cigarettes  . Smokeless tobacco: Not on file  . Alcohol Use: No   Family History  Problem Relation Age of Onset  . Diabetes Father   . Alcohol abuse Paternal Uncle   . Heart disease Neg Hx   . Stroke Neg Hx   . Cancer Neg Hx   . Drug abuse Neg Hx   . Depression Neg Hx   . Alcohol abuse Paternal Uncle   . Alcohol abuse Paternal Uncle    No Known Allergies Current Outpatient Prescriptions on File Prior to Visit  Medication Sig Dispense Refill  . ibuprofen (ADVIL,MOTRIN) 200 MG tablet Take 200 mg by mouth every 6 (six) hours as needed. For back pain      . atorvastatin (LIPITOR) 20 MG tablet Take 1 tablet (20 mg total) by mouth daily.  30 tablet  6  . fenofibrate (TRICOR) 145 MG tablet Take 1 tablet (145 mg total) by mouth daily.  30 tablet  6  . insulin detemir (LEVEMIR) 100 UNIT/ML injection Inject 18 Units into the skin at bedtime.  10 mL  6  .  metFORMIN (GLUCOPHAGE) 500 MG tablet Take 2 tablets (1,000 mg total) by mouth 2 (two) times daily with a meal.  120 tablet  6  . metoprolol (LOPRESSOR) 50 MG tablet Take 1/2 tablet by mouth twice daily.  30 tablet  6  . pioglitazone (ACTOS) 45 MG tablet Take 1 tablet (45 mg total) by mouth daily.  30 tablet  6  . ramipril (ALTACE) 10 MG tablet Take 1 tablet (10 mg total) by mouth daily.  30 tablet  6   The PMH, PSH, Social History, Family History, Medications, and allergies have been reviewed in [facility], and have been updated if relevant.    Review of Systems    See HPI No dysuria No blood in urine Objective:   Physical Exam BP 118/62  Pulse 60  Temp 97.9 F (36.6 C)  Wt 151 lb (68.493 kg) Gen:  Alert, frail appearing, no acute distress. Abd:  Soft, NT, no CVA or suprapubic tenderness Ext: no edema    Assessment & Plan:   1. Prostate hyperplasia with urinary obstruction  Deteriorated. UA  neg. Defer prostate exam today as there may be a component of acute prostatitis- start flomax, cipro.   Check PSA, CBC, CMET. Given continued weight loss along with duration of symptoms, will go ahead and order a CT of abd/pelvis to rule out malignancy. The patient indicates understanding of these issues and agrees with the plan.  PSA, CBC with Differential, CT Abdomen Pelvis Wo Contrast  2. Weight loss  Deteriorated. See above. CT Abdomen Pelvis Wo Contrast, Comprehensive metabolic panel

## 2011-08-08 NOTE — Patient Instructions (Addendum)
Good to see you. Please take cipro and flomax as directed. Stop by to see Asher Muir up front to schedule your CT scan.

## 2011-08-09 ENCOUNTER — Ambulatory Visit (INDEPENDENT_AMBULATORY_CARE_PROVIDER_SITE_OTHER)
Admission: RE | Admit: 2011-08-09 | Discharge: 2011-08-09 | Disposition: A | Discharge status: Home / Self Care | Payer: Medicare Other | Source: Ambulatory Visit | Attending: Family Medicine | Admitting: Family Medicine

## 2011-08-09 DIAGNOSIS — R109 Unspecified abdominal pain: Secondary | ICD-10-CM | POA: Diagnosis not present

## 2011-08-09 DIAGNOSIS — N400 Enlarged prostate without lower urinary tract symptoms: Secondary | ICD-10-CM | POA: Diagnosis not present

## 2011-08-09 DIAGNOSIS — N139 Obstructive and reflux uropathy, unspecified: Secondary | ICD-10-CM

## 2011-08-09 DIAGNOSIS — N138 Other obstructive and reflux uropathy: Secondary | ICD-10-CM

## 2011-08-09 DIAGNOSIS — R634 Abnormal weight loss: Secondary | ICD-10-CM | POA: Diagnosis not present

## 2011-08-09 DIAGNOSIS — N401 Enlarged prostate with lower urinary tract symptoms: Secondary | ICD-10-CM | POA: Diagnosis not present

## 2011-08-09 MED ORDER — IOHEXOL 350 MG/ML SOLN
100.0000 mL | Freq: Once | INTRAVENOUS | Status: AC | PRN
  Administered 2011-08-09: 100 mL via INTRAVENOUS

## 2011-08-10 ENCOUNTER — Other Ambulatory Visit: Payer: Self-pay | Admitting: Family Medicine

## 2011-08-10 DIAGNOSIS — N320 Bladder-neck obstruction: Secondary | ICD-10-CM | POA: Insufficient documentation

## 2011-08-10 DIAGNOSIS — N400 Enlarged prostate without lower urinary tract symptoms: Secondary | ICD-10-CM | POA: Insufficient documentation

## 2011-08-17 DIAGNOSIS — N400 Enlarged prostate without lower urinary tract symptoms: Secondary | ICD-10-CM | POA: Diagnosis not present

## 2011-08-31 DIAGNOSIS — E119 Type 2 diabetes mellitus without complications: Secondary | ICD-10-CM | POA: Diagnosis not present

## 2011-08-31 DIAGNOSIS — Z794 Long term (current) use of insulin: Secondary | ICD-10-CM | POA: Diagnosis not present

## 2011-09-12 ENCOUNTER — Ambulatory Visit (INDEPENDENT_AMBULATORY_CARE_PROVIDER_SITE_OTHER): Payer: Medicare Other | Admitting: Family Medicine

## 2011-09-12 ENCOUNTER — Encounter: Payer: Self-pay | Admitting: Family Medicine

## 2011-09-12 ENCOUNTER — Ambulatory Visit (INDEPENDENT_AMBULATORY_CARE_PROVIDER_SITE_OTHER)
Admission: RE | Admit: 2011-09-12 | Discharge: 2011-09-12 | Disposition: A | Discharge status: Home / Self Care | Payer: Medicare Other | Source: Ambulatory Visit | Attending: Family Medicine | Admitting: Family Medicine

## 2011-09-12 VITALS — BP 100/62 | HR 72 | Temp 97.7°F | Wt 147.0 lb

## 2011-09-12 DIAGNOSIS — R634 Abnormal weight loss: Secondary | ICD-10-CM | POA: Diagnosis not present

## 2011-09-12 MED ORDER — MIRTAZAPINE 15 MG PO TABS: 15.0000 mg | ORAL_TABLET | Freq: Every day | ORAL | Status: DC

## 2011-09-12 NOTE — Progress Notes (Signed)
Subjective:    Patient ID: Antonio [name], male    DOB: [birth year], 66 y.o.   MRN: [id number]  HPI  66 yo here persistent weight loss weight, fatigue.   Back on his insulin. CBGs good and seeing endocrinologist- a1c much improved at 7.2 but he continues to loose weight.  Wt Readings from Last 3 Encounters:  09/12/11 147 lb (66.679 kg)  08/08/11 151 lb (68.493 kg)  04/24/11 157 lb (71.215 kg)   No nausea or vomiting. No night sweats.  Results for orders placed in visit on 08/08/11  PSA      Component Value Range   PSA 1.94  0.10 - 4.00 ng/mL  CBC WITH DIFFERENTIAL      Component Value Range   WBC 9.8  4.5 - 10.5 K/uL   RBC 5.14  4.22 - 5.81 Mil/uL   Hemoglobin 15.8  13.0 - 17.0 g/dL   HCT 62.1  30.8 - 65.7 %   MCV 91.7  78.0 - 100.0 fl   MCHC 33.4  30.0 - 36.0 g/dL   RDW 84.6  96.2 - 95.2 %   Platelets 252.0  150.0 - 400.0 K/uL   Neutrophils Relative 67.2  43.0 - 77.0 %   Lymphocytes Relative 23.3  12.0 - 46.0 %   Monocytes Relative 6.9  3.0 - 12.0 %   Eosinophils Relative 2.0  0.0 - 5.0 %   Basophils Relative 0.6  0.0 - 3.0 %   Neutro Abs 6.6  1.4 - 7.7 K/uL   Lymphs Abs 2.3  0.7 - 4.0 K/uL   Monocytes Absolute 0.7  0.1 - 1.0 K/uL   Eosinophils Absolute 0.2  0.0 - 0.7 K/uL   Basophils Absolute 0.1  0.0 - 0.1 K/uL  COMPREHENSIVE METABOLIC PANEL      Component Value Range   Sodium 140  135 - 145 mEq/L   Potassium 3.9  3.5 - 5.1 mEq/L   Chloride 103  96 - 112 mEq/L   CO2 28  19 - 32 mEq/L   Glucose, Bld 187 (*) 70 - 99 mg/dL   BUN 15  6 - 23 mg/dL   Creatinine, Ser 0.7  0.4 - 1.5 mg/dL   Total Bilirubin 0.7  0.3 - 1.2 mg/dL   Alkaline Phosphatase 49  39 - 117 U/L   AST 16  0 - 37 U/L   ALT 10  0 - 53 U/L   Total Protein 6.6  6.0 - 8.3 g/dL   Albumin 3.9  3.5 - 5.2 g/dL   Calcium 9.6  8.4 - 84.1 mg/dL   GFR 324.40  >10.27 mL/min  POCT URINALYSIS DIPSTICK      Component Value Range   Color, UA yellow     Clarity, UA clear     Glucose, UA 250 mg/dl     Bilirubin, UA negative     Ketones, UA negative     Spec Grav, UA 1.015     Blood, UA negative     pH, UA 6.0     Protein, UA negative     Urobilinogen, UA negative     Nitrite, UA negative     Leukocytes, UA Negative     CT abd/pelvis neg for malignancy.  Under significant stress- caring for his wife with advanced dementia.  Has no time for himself.  Can't sleep, just does not feel hungry. Does not feel full faster.  No n/v/d.  No SI or HI--he just "feels exhausted."  Her daughter have  not been helping him care for her.  He does not want to put her in a nursing home.  Patient Active Problem List  Diagnosis  . DIABETES MELLITUS, TYPE II  . HYPERLIPIDEMIA  . HYPERTENSION, BENIGN ESSENTIAL  . CORONARY ARTERY DISEASE  . HEMORRHOIDS, INTERNAL  . URI  . ESOPHAGEAL STRICTURE  . GASTRITIS  . LOW BACK PAIN, CHRONIC  . PSA, INCREASED  . BURN, FIRST DEGREE, FINGERS  . BURN, SECOND DEGREE, HAND  . TOBACCO ABUSE, HX OF  . Bronchitis  . Back pain  . Weight loss, abnormal  . Bladder outlet obstruction  . Enlarged prostate   Past Medical History  Diagnosis Date  . Hypertension   . Hyperlipidemia   . Diabetes mellitus     Type II  . Gastritis   . Esophageal stricture   . URI (upper respiratory infection)   . PSA elevation   . Internal hemorrhoids without mention of complication   . Lumbago   . Tobacco abuse   . Coronary atherosclerosis of unspecified type of vessel, native or graft   . Hx of colonic polyps    Past Surgical History  Procedure Date  . Knee arthroscopy 10/00    Right  . Stress myoview 07/06    EF 67%  . 2d echo 06/03  . Ct of head  and sinuses 06/03    Negative  . Lp 06/03  . Abdominal ultrasound 06/03    Negative  . Ct of the chest, abdomen, pelvis 06/03    Chest negative; Abdomen, left adrenal lesion; Pelvis, enlarged prostate  . Persantine cardiolite 03/01    EF 68%  . Esophagogastroduodenoscopy 02/13/07    gastritis and duodenitis without bleed    . Microwave thermotherapy prostate 08/28/07    Artis Flock  . Polypectomy 11/95   History  Substance Use Topics  . Smoking status: Current Everyday Smoker -- 1.0 packs/day for 40 years    Types: Cigarettes  . Smokeless tobacco: Not on file  . Alcohol Use: No   Family History  Problem Relation Age of Onset  . Diabetes Father   . Alcohol abuse Paternal Uncle   . Heart disease Neg Hx   . Stroke Neg Hx   . Cancer Neg Hx   . Drug abuse Neg Hx   . Depression Neg Hx   . Alcohol abuse Paternal Uncle   . Alcohol abuse Paternal Uncle    No Known Allergies Current Outpatient Prescriptions on File Prior to Visit  Medication Sig Dispense Refill  . aspirin 325 MG tablet Take 1 tablet (325 mg total) by mouth daily.      [name] atorvastatin (LIPITOR) 20 MG tablet Take 1 tablet (20 mg total) by mouth daily.  30 tablet  6  . ciprofloxacin (CIPRO) 500 MG tablet Take 1 tablet (500 mg total) by mouth 2 (two) times daily.  60 tablet  1  . fenofibrate (TRICOR) 145 MG tablet Take 1 tablet (145 mg total) by mouth daily.  30 tablet  6  . ibuprofen (ADVIL,MOTRIN) 200 MG tablet Take 200 mg by mouth every 6 (six) hours as needed. For back pain      . insulin detemir (LEVEMIR) 100 UNIT/ML injection Inject 18 Units into the skin at bedtime.  10 mL  6  . metFORMIN (GLUCOPHAGE) 500 MG tablet Take 2 tablets (1,000 mg total) by mouth 2 (two) times daily with a meal.  120 tablet  6  . metoprolol (LOPRESSOR) 50 MG tablet Take 1/2  tablet by mouth twice daily.  30 tablet  6  . pioglitazone (ACTOS) 45 MG tablet Take 1 tablet (45 mg total) by mouth daily.  30 tablet  6  . ramipril (ALTACE) 10 MG tablet Take 1 tablet (10 mg total) by mouth daily.  30 tablet  6  . Tamsulosin HCl (FLOMAX) 0.4 MG CAPS Take 1 capsule (0.4 mg total) by mouth daily.  30 capsule  3   The PMH, PSH, Social History, Family History, Medications, and allergies have been reviewed in [facility], and have been updated if relevant.    Review of Systems    See  HPI No blood in stool No changes in bowel habits No night sweats No fevers No bone pain. Objective:   Physical Exam BP 100/62  Pulse 72  Temp 97.7 F (36.5 C)  Wt 147 lb (66.679 kg) Wt Readings from Last 3 Encounters:  09/12/11 147 lb (66.679 kg)  08/08/11 151 lb (68.493 kg)  04/24/11 157 lb (71.215 kg)    Gen:  Alert, frail appearing, no acute distress. Abd:  Soft, NT, no CVA or suprapubic tenderness Ext: no edema    Assessment & Plan:    1. Weight loss, abnormal  DG Chest 2 View   Deteriorated- likely due to depression/difficulty coping with caring for his wife. He has great insight-he is attending seminars about dementia.  He is considering getting some in home care for her so he can care for him self. Labs and studies have been neg for occult malignancy. Will order CXR today given long term smoking history but he has no cough or other symptoms suggestive of lung CA. Start Remeron 15 mg night for insomnia, depression and appetite. Follow up in 1 month. The patient indicates understanding of these issues and agrees with the plan.

## 2011-09-12 NOTE — Patient Instructions (Addendum)
It is good to see you. We are starting remeron (mirtazapine) 15 mg nightly. Please call in one month with an update of your symptoms.  We will call you with your xray results.

## 2011-09-14 DIAGNOSIS — H2510 Age-related nuclear cataract, unspecified eye: Secondary | ICD-10-CM | POA: Diagnosis not present

## 2011-09-28 DIAGNOSIS — N400 Enlarged prostate without lower urinary tract symptoms: Secondary | ICD-10-CM | POA: Diagnosis not present

## 2011-11-08 ENCOUNTER — Other Ambulatory Visit: Payer: Self-pay | Admitting: Family Medicine

## 2011-11-09 ENCOUNTER — Other Ambulatory Visit: Payer: Self-pay | Admitting: *Deleted

## 2011-11-09 MED ORDER — METOPROLOL TARTRATE 50 MG PO TABS: ORAL_TABLET | ORAL | Status: DC

## 2011-11-19 ENCOUNTER — Other Ambulatory Visit: Payer: Self-pay | Admitting: Family Medicine

## 2011-11-29 DIAGNOSIS — Z794 Long term (current) use of insulin: Secondary | ICD-10-CM | POA: Diagnosis not present

## 2011-12-06 DIAGNOSIS — Z794 Long term (current) use of insulin: Secondary | ICD-10-CM | POA: Diagnosis not present

## 2011-12-06 DIAGNOSIS — F17200 Nicotine dependence, unspecified, uncomplicated: Secondary | ICD-10-CM | POA: Diagnosis not present

## 2011-12-06 DIAGNOSIS — E119 Type 2 diabetes mellitus without complications: Secondary | ICD-10-CM | POA: Diagnosis not present

## 2011-12-06 DIAGNOSIS — I10 Essential (primary) hypertension: Secondary | ICD-10-CM | POA: Diagnosis not present

## 2011-12-22 ENCOUNTER — Other Ambulatory Visit: Payer: Self-pay | Admitting: Family Medicine

## 2012-01-26 ENCOUNTER — Other Ambulatory Visit: Payer: Self-pay | Admitting: Family Medicine

## 2012-04-02 DIAGNOSIS — E119 Type 2 diabetes mellitus without complications: Secondary | ICD-10-CM | POA: Diagnosis not present

## 2012-04-03 DIAGNOSIS — N400 Enlarged prostate without lower urinary tract symptoms: Secondary | ICD-10-CM | POA: Diagnosis not present

## 2012-04-10 DIAGNOSIS — F17200 Nicotine dependence, unspecified, uncomplicated: Secondary | ICD-10-CM | POA: Diagnosis not present

## 2012-04-10 DIAGNOSIS — Z794 Long term (current) use of insulin: Secondary | ICD-10-CM | POA: Diagnosis not present

## 2012-04-10 DIAGNOSIS — I10 Essential (primary) hypertension: Secondary | ICD-10-CM | POA: Diagnosis not present

## 2012-04-10 DIAGNOSIS — E119 Type 2 diabetes mellitus without complications: Secondary | ICD-10-CM | POA: Diagnosis not present

## 2012-06-01 ENCOUNTER — Other Ambulatory Visit: Payer: Self-pay | Admitting: Family Medicine

## 2012-06-30 ENCOUNTER — Other Ambulatory Visit: Payer: Self-pay | Admitting: Family Medicine

## 2012-07-04 ENCOUNTER — Telehealth: Payer: Self-pay

## 2012-07-04 NOTE — Telephone Encounter (Signed)
Pt scheduled appt with Dr Dayton Martes on 07/08/12 for med refills and has med to last until next appt. Pt wants to know if [facility] has changed there policy on refills. Pt said in the past of a refill was needed [facility] would contact doctor's office but lately pt has to contact doctor. Pt will speak with [facility] about any change in refill policy.

## 2012-07-08 ENCOUNTER — Encounter: Payer: Self-pay | Admitting: Family Medicine

## 2012-07-08 ENCOUNTER — Telehealth: Payer: Self-pay | Admitting: *Deleted

## 2012-07-08 ENCOUNTER — Ambulatory Visit (INDEPENDENT_AMBULATORY_CARE_PROVIDER_SITE_OTHER): Payer: Medicare Other | Admitting: Family Medicine

## 2012-07-08 VITALS — BP 140/80 | HR 68 | Temp 97.7°F | Wt 162.0 lb

## 2012-07-08 DIAGNOSIS — I10 Essential (primary) hypertension: Secondary | ICD-10-CM | POA: Diagnosis not present

## 2012-07-08 DIAGNOSIS — Z136 Encounter for screening for cardiovascular disorders: Secondary | ICD-10-CM

## 2012-07-08 DIAGNOSIS — E119 Type 2 diabetes mellitus without complications: Secondary | ICD-10-CM

## 2012-07-08 LAB — COMPREHENSIVE METABOLIC PANEL
ALT: 9 U/L (ref 0–53)
AST: 13 U/L (ref 0–37)
Albumin: 3.9 g/dL (ref 3.5–5.2)
Alkaline Phosphatase: 59 U/L (ref 39–117)
BUN: 15 mg/dL (ref 6–23)
Calcium: 9.1 mg/dL (ref 8.4–10.5)
Chloride: 102 mEq/L (ref 96–112)
Potassium: 4.1 mEq/L (ref 3.5–5.1)
Sodium: 137 mEq/L (ref 135–145)
Total Protein: 7 g/dL (ref 6.0–8.3)

## 2012-07-08 LAB — LIPID PANEL
LDL Cholesterol: 108 mg/dL — ABNORMAL HIGH (ref 0–99)
Total CHOL/HDL Ratio: 4
Triglycerides: 151 mg/dL — ABNORMAL HIGH (ref 0.0–149.0)

## 2012-07-08 LAB — HEMOGLOBIN A1C: Hgb A1c MFr Bld: 7.9 % — ABNORMAL HIGH (ref 4.6–6.5)

## 2012-07-08 MED ORDER — METFORMIN HCL 500 MG PO TABS: ORAL_TABLET | ORAL | Status: DC

## 2012-07-08 MED ORDER — RAMIPRIL 10 MG PO CAPS: ORAL_CAPSULE | ORAL | Status: DC

## 2012-07-08 MED ORDER — ATORVASTATIN CALCIUM 20 MG PO TABS: ORAL_TABLET | ORAL | Status: DC

## 2012-07-08 MED ORDER — INSULIN DETEMIR 100 UNIT/ML ~~LOC~~ SOLN: SUBCUTANEOUS | Status: DC

## 2012-07-08 NOTE — Telephone Encounter (Signed)
Scripts were sent electronically to [facility] this morning during pts' visits.  I just checked with [facility] and they said they have the scripts ready for pick up.  Left message on patients' voice mail advising this.

## 2012-07-08 NOTE — Telephone Encounter (Signed)
Patient called stating that he and his wife were in to be seen today for refills on medications. Patient states that he has checked with [facility] and they told him that they do not have any scripts for them. Please let him know when this has been done.

## 2012-07-08 NOTE — Progress Notes (Signed)
Subjective:    Patient ID: Antonio [name], male    DOB: 1945-05-22, 67 y.o.   MRN: [id number]  HPI  67 yo here for follow up.  DM- followed by Dr. Reuel Derby.  Last saw her in 04/2012.  Notes reviewed.  Restarted meds and DM much improved! Checking CBGs daily- ranging 190s- 200.  Taking Levemir 20 units qhs, Metformin 500 mg twice daily, pioglitazone 25 mg daily. Denies any episodes of hypoglycemia. On ACEI.  Now that DM controlled, weight has improved. Wt Readings from Last 3 Encounters:  07/08/12 162 lb (73.483 kg)  09/12/11 147 lb (66.679 kg)  08/08/11 151 lb (68.493 kg)    HTN- has been stable on Metfoprolol.  Denies any HA, blurred vision, CP or LE edema.  HLD- on fenofibrate and Atorvastatin. Denies any myalgias.  Patient Active Problem List   Diagnosis Date Noted  . Bladder outlet obstruction 08/10/2011  . Back pain 04/24/2011  . Weight loss, abnormal 04/24/2011  . ESOPHAGEAL STRICTURE 12/26/2006  . GASTRITIS 12/26/2006  . HYPERTENSION, BENIGN ESSENTIAL 11/19/2006  . PSA, INCREASED 11/19/2006  . DIABETES MELLITUS, TYPE II 06/27/2006  . HYPERLIPIDEMIA 06/27/2006  . CORONARY ARTERY DISEASE 06/27/2006  . HEMORRHOIDS, INTERNAL 06/27/2006  . LOW BACK PAIN, CHRONIC 06/27/2006  . TOBACCO ABUSE, HX OF 06/27/2006   Past Medical History  Diagnosis Date  . Hypertension   . Hyperlipidemia   . Diabetes mellitus     Type II  . Gastritis   . Esophageal stricture   . URI (upper respiratory infection)   . PSA elevation   . Internal hemorrhoids without mention of complication   . Lumbago   . Tobacco abuse   . Coronary atherosclerosis of unspecified type of vessel, native or graft   . Hx of colonic polyps    Past Surgical History  Procedure Laterality Date  . Knee arthroscopy  10/00    Right  . Stress myoview  07/06    EF 67%  . 2d echo  06/03  . Ct of head  and sinuses  06/03    Negative  . Lp  06/03  . Abdominal ultrasound  06/03    Negative  . Ct of the chest,  abdomen, pelvis  06/03    Chest negative; Abdomen, left adrenal lesion; Pelvis, enlarged prostate  . Persantine cardiolite  03/01    EF 68%  . Esophagogastroduodenoscopy  02/13/07    gastritis and duodenitis without bleed  . Microwave thermotherapy prostate  08/28/07    Artis Flock  . Polypectomy  11/95   History  Substance Use Topics  . Smoking status: Current Every Day Smoker -- 1.00 packs/day for 40 years    Types: Cigarettes  . Smokeless tobacco: Not on file  . Alcohol Use: No   Family History  Problem Relation Age of Onset  . Diabetes Father   . Alcohol abuse Paternal Uncle   . Heart disease Neg Hx   . Stroke Neg Hx   . Cancer Neg Hx   . Drug abuse Neg Hx   . Depression Neg Hx   . Alcohol abuse Paternal Uncle   . Alcohol abuse Paternal Uncle    No Known Allergies Current Outpatient Prescriptions on File Prior to Visit  Medication Sig Dispense Refill  . aspirin 325 MG tablet Take 1 tablet (325 mg total) by mouth daily.      [name] ibuprofen (ADVIL,MOTRIN) 200 MG tablet Take 200 mg by mouth every 6 (six) hours as needed. For back pain      .  metoprolol (LOPRESSOR) 50 MG tablet Take 1/2 tablet by mouth twice daily.  30 tablet  6  . Tamsulosin HCl (FLOMAX) 0.4 MG CAPS Take 1 capsule (0.4 mg total) by mouth daily.  30 capsule  3   No current facility-administered medications on file prior to visit.   The PMH, PSH, Social History, Family History, Medications, and allergies have been reviewed in [facility], and have been updated if relevant.  Review of Systems See HPI    Objective:   Physical Exam BP 140/80  Pulse 68  Temp(Src) 97.7 F (36.5 C)  Wt 162 lb (73.483 kg)  BMI 26.76 kg/m2 Wt Readings from Last 3 Encounters:  07/08/12 162 lb (73.483 kg)  09/12/11 147 lb (66.679 kg)  08/08/11 151 lb (68.493 kg)  General:  Pleasant male in NAD Eyes:  PERRL Ears:  External ear exam shows no significant lesions or deformities.  Otoscopic examination reveals clear canals, tympanic membranes  are intact bilaterally without bulging, retraction, inflammation or discharge. Hearing is grossly normal bilaterally. Nose:  External nasal examination shows no deformity or inflammation. Nasal mucosa are pink and moist without lesions or exudates. Mouth:  Oral mucosa and oropharynx without lesions or exudates.  Teeth in good repair. Neck:  no carotid bruit or thyromegaly no cervical or supraclavicular lymphadenopathy  Lungs:  Normal respiratory effort, chest expands symmetrically. Lungs are clear to auscultation, no crackles or wheezes. Heart:  Normal rate and regular rhythm. S1 and S2 normal without gallop, murmur, click, rub or other extra sounds. Abdomen:  Bowel sounds positive,abdomen soft and non-tender without masses, organomegaly or hernias noted. Extremities:  no edema     Assessment & Plan:  1. DIABETES MELLITUS, TYPE II Much improved! Recheck a1c today. Continue Ramipril. - Hemoglobin A1c  2. HYPERTENSION, BENIGN ESSENTIAL Stable on current meds. - Comprehensive metabolic panel  3. Screening for ischemic heart disease  - Lipid Panel

## 2012-07-08 NOTE — Telephone Encounter (Signed)
Ok to refill rxs.

## 2012-08-13 ENCOUNTER — Other Ambulatory Visit: Payer: Self-pay | Admitting: *Deleted

## 2012-08-13 MED ORDER — METOPROLOL TARTRATE 50 MG PO TABS: ORAL_TABLET | ORAL | Status: DC

## 2012-08-15 DIAGNOSIS — F17200 Nicotine dependence, unspecified, uncomplicated: Secondary | ICD-10-CM | POA: Diagnosis not present

## 2012-08-15 DIAGNOSIS — I10 Essential (primary) hypertension: Secondary | ICD-10-CM | POA: Diagnosis not present

## 2012-08-15 DIAGNOSIS — E119 Type 2 diabetes mellitus without complications: Secondary | ICD-10-CM | POA: Diagnosis not present

## 2012-11-16 ENCOUNTER — Emergency Department: Payer: Self-pay | Admitting: Emergency Medicine

## 2012-11-16 DIAGNOSIS — R339 Retention of urine, unspecified: Secondary | ICD-10-CM | POA: Diagnosis not present

## 2012-11-16 DIAGNOSIS — Z9889 Other specified postprocedural states: Secondary | ICD-10-CM | POA: Diagnosis not present

## 2012-11-16 DIAGNOSIS — E119 Type 2 diabetes mellitus without complications: Secondary | ICD-10-CM | POA: Diagnosis not present

## 2012-11-16 LAB — URINALYSIS, COMPLETE
Bacteria: NONE SEEN
Bilirubin,UR: NEGATIVE
Glucose,UR: >=500 mg/dL (ref 0–75)
Ketone: NEGATIVE
Ph: 5 (ref 4.5–8.0)
RBC,UR: <1 /HPF (ref 0–5)
Squamous Epithelial: NONE SEEN

## 2012-11-29 DIAGNOSIS — R339 Retention of urine, unspecified: Secondary | ICD-10-CM | POA: Diagnosis not present

## 2012-11-29 DIAGNOSIS — R369 Urethral discharge, unspecified: Secondary | ICD-10-CM | POA: Diagnosis not present

## 2012-11-29 DIAGNOSIS — N4829 Other inflammatory disorders of penis: Secondary | ICD-10-CM | POA: Diagnosis not present

## 2012-12-03 ENCOUNTER — Encounter: Payer: Self-pay | Admitting: Physician Assistant

## 2012-12-03 ENCOUNTER — Ambulatory Visit (INDEPENDENT_AMBULATORY_CARE_PROVIDER_SITE_OTHER): Payer: Medicare Other | Admitting: Family Medicine

## 2012-12-03 ENCOUNTER — Encounter: Payer: Self-pay | Admitting: Family Medicine

## 2012-12-03 VITALS — BP 150/80 | HR 76 | Temp 98.5°F | Wt 158.5 lb

## 2012-12-03 DIAGNOSIS — R1319 Other dysphagia: Secondary | ICD-10-CM

## 2012-12-03 NOTE — Patient Instructions (Signed)
Try taking prilosec/omeprazole 20mg  a day in the meantime and talk to [facility] before you leave (about seeing GI).  Take care.   Take small bites of food in the meantime.  Eat slowly.

## 2012-12-03 NOTE — Progress Notes (Signed)
Recently with urinary obstruction, had a catheter placed.  Started back on flomax and recently started on doxycycline.  Catheter is out.  Stream is good.    For the last week and a half, he has pain with eating. Upper abd pain.  Burning.  Started before the doxycycline.  Vomiting after meals.  Looks clear or like recent food. Can vomit after drinking water. Feeling of food sticking.  No blood in vomit.  Generally does better with liquids than solids.  No blood in stool.  Still with regular BMs. Pain waxes and wanes. No lower abd pain.  Not SOB.  No CP.  On aspirin, no other nsaids recently.  Taste is still normal. Frequent burping.  Generally does okay with swallowing pills.  Maalox or similar helps some.   CT in 2013-There is diffuse distal esophageal wall thickening. This is unchanged since 2008. No discrete mass.  Meds, vitals, and allergies reviewed.   ROS: See HPI.  Otherwise, noncontributory.  nad ncat Mmm rrr ctab abd soft, not ttp except for ttp w/o rebound in the epigastrum Normal BS Ext w/o edema

## 2012-12-04 DIAGNOSIS — R1319 Other dysphagia: Secondary | ICD-10-CM | POA: Insufficient documentation

## 2012-12-04 NOTE — Assessment & Plan Note (Signed)
ddx d/w pt.  Restart PPI, refer to GI, caution on eating in the meantime. He agrees.

## 2012-12-06 ENCOUNTER — Ambulatory Visit (INDEPENDENT_AMBULATORY_CARE_PROVIDER_SITE_OTHER): Payer: Medicare Other | Admitting: Physician Assistant

## 2012-12-06 ENCOUNTER — Encounter: Payer: Self-pay | Admitting: Physician Assistant

## 2012-12-06 VITALS — BP 160/80 | HR 66 | Ht 65.5 in | Wt 159.6 lb

## 2012-12-06 DIAGNOSIS — K219 Gastro-esophageal reflux disease without esophagitis: Secondary | ICD-10-CM | POA: Diagnosis not present

## 2012-12-06 DIAGNOSIS — R1310 Dysphagia, unspecified: Secondary | ICD-10-CM

## 2012-12-06 MED ORDER — ESOMEPRAZOLE MAGNESIUM 40 MG PO CPDR: 40.0000 mg | DELAYED_RELEASE_CAPSULE | Freq: Every day | ORAL | Status: DC

## 2012-12-06 NOTE — Progress Notes (Signed)
Subjective:    Patient ID: Antonio [name], male    DOB: September 04, 1945, 67 y.o.   MRN: [id number]  HPI  Antonio [name] is a pleasant 67 year old white male known to Dr. Russella Dar. He had undergone an endoscopy in January of 2009 with finding of gastritis and duodenitis and also was every dilated to 17 mm for complaints of dysphagia though no stricture was noted. He has history of hypertension hyperlipidemia coronary artery disease and adult-onset diabetes mellitus. He has recently been having difficulty with urinary outlet obstruction and has been seen by urology required a catheterization and is now on a two-week course of doxycycline. He says that he started having difficulty with dysphagia about a week and a half ago. He says he was having difficulty with meds and everything that he would eat or drink. He says his food goes down but seems to get stuck in his lower esophagus and then sits there. He says when this occurs it is uncomfortable but other than that he is not having odynophagia. He has noticed increased burping and belching as well as heartburn. He denies any nausea or vomiting though has been having some regurgitation. He has no complaints of abdominal pain. He does feel that his symptoms are definitely worse since being on the doxycycline. He is taking an over-the-counter PPI but cannot remember the name of it, he started this about 3 days ago and says that he is feeling a little bit better.    Review of Systems  Constitutional: Negative.   HENT: Positive for trouble swallowing.   Eyes: Negative.   Respiratory: Negative.   Gastrointestinal: Positive for abdominal pain.  Endocrine: Negative.   Genitourinary: Negative.   Musculoskeletal: Negative.   Skin: Negative.   Allergic/Immunologic: Negative.   Neurological: Negative.   Hematological: Negative.   Psychiatric/Behavioral: Negative.    Outpatient Prescriptions Prior to Visit  Medication Sig Dispense Refill  . aspirin 325 MG tablet Take 1  tablet (325 mg total) by mouth daily.      [name] atorvastatin (LIPITOR) 20 MG tablet TAKE ONE TABLET BY MOUTH EVERY DAY  30 tablet  5  . doxycycline (DORYX) 100 MG DR capsule Take 100 mg by mouth 2 (two) times daily.      . insulin detemir (LEVEMIR) 100 UNIT/ML injection Inject 20 units at bedtime.  10 pen  5  . metFORMIN (GLUCOPHAGE) 500 MG tablet TAKE TWO TABLETS BY MOUTH TWICE DAILY WITH A MEAL  120 tablet  5  . metoprolol (LOPRESSOR) 50 MG tablet Take 1/2 tablet by mouth twice daily.  30 tablet  3  . ramipril (ALTACE) 10 MG capsule TAKE ONE CAPSULE BY MOUTH EVERY DAY  30 capsule  5  . Tamsulosin HCl (FLOMAX) 0.4 MG CAPS Take 1 capsule (0.4 mg total) by mouth daily.  30 capsule  3   No facility-administered medications prior to visit.   No Known Allergies   and Patient Active Problem List   Diagnosis Date Noted  . Other dysphagia 12/04/2012  . Bladder outlet obstruction 08/10/2011  . Back pain 04/24/2011  . Weight loss, abnormal 04/24/2011  . ESOPHAGEAL STRICTURE 12/26/2006  . GASTRITIS 12/26/2006  . HYPERTENSION, BENIGN ESSENTIAL 11/19/2006  . PSA, INCREASED 11/19/2006  . DIABETES MELLITUS, TYPE II 06/27/2006  . HYPERLIPIDEMIA 06/27/2006  . CORONARY ARTERY DISEASE 06/27/2006  . HEMORRHOIDS, INTERNAL 06/27/2006   . LOW BACK PAIN, CHRONIC 06/27/2006  . TOBACCO ABUSE, HX OF 06/27/2006   History  Substance Use Topics  . Smoking status:  Current Every Day Smoker -- 1.00 packs/day for 40 years    Types: Cigarettes  . Smokeless tobacco: Never Used  . Alcohol Use: Yes     Comment: rarely    Objective:   Physical Exam  well-developed white male in no acute distress, accompanied by his wife blood pressure 160/80 pulse 66 height 5 foot 5 weight 159. HEENT nontraumatic normocephalic EOMI PERRLA sclera anicteric, Supple no JVD, Cardiovascular regular rate and rhythm with S1-S2 no murmur or gallop, Pulmonary clear bilaterally, Abdomen soft nontender nondistended bowel sounds are active there  is no palpable mass or hepatosplenomegaly, Rectal exam not done, Terminates no clubbing cyanosis or edema skin warm and dry, Psych mood and affect normal and appropriate        Assessment & Plan:   # 67 yo male with 10-12 day hx of dysphagia in setting of doxycycline use. Pt with prior hx of dysphagia responsive to empiric dilation he may have a pill-induced esophagitis and/or underlying reflux esophagitis or stricture. #2 adult-onset diabetes mellitus #3 hypertension #4 coronary artery disease #5 smoker  Plan; Will start Nexium 40 mg by mouth twice daily x10 days samples given then he can switch to his over-the-counter PPI and use this twice daily for 2 more weeks and if symptoms have resolved but once daily PPI We have scheduled him for an EGD with possible esophageal dilation with Dr. Jalene Mullet was discussed in detail with the patient and he is agreeable to proceed. I did advise them that that if his symptoms have completely resolved prior to the procedure that he may call back  To  cancel otherwise he should proceed with upper endoscopy as scheduled.

## 2012-12-06 NOTE — Patient Instructions (Addendum)
You have been scheduled for an endoscopy with propofol. Please follow written instructions given to you at your visit today. If you use inhalers (even only as needed), please bring them with you on the day of your procedure.  Begin taking the Nexium twice a day until the samples are gone.  At that point, take the over the counter gerd medication you have been taking, twice a day for 2 weeks.  Then decrease to once a day.

## 2012-12-06 NOTE — Progress Notes (Signed)
Reviewed and agree with management plan. If his symptoms resolve with treatment of GERD, would cancel EGD.  Venita Lick. Russella Dar, MD [facility]

## 2012-12-19 DIAGNOSIS — E119 Type 2 diabetes mellitus without complications: Secondary | ICD-10-CM | POA: Diagnosis not present

## 2012-12-20 ENCOUNTER — Encounter: Payer: Medicare Other | Admitting: Gastroenterology

## 2012-12-26 DIAGNOSIS — I10 Essential (primary) hypertension: Secondary | ICD-10-CM | POA: Diagnosis not present

## 2012-12-26 DIAGNOSIS — Z794 Long term (current) use of insulin: Secondary | ICD-10-CM | POA: Diagnosis not present

## 2012-12-26 DIAGNOSIS — F17200 Nicotine dependence, unspecified, uncomplicated: Secondary | ICD-10-CM | POA: Diagnosis not present

## 2012-12-30 DIAGNOSIS — R339 Retention of urine, unspecified: Secondary | ICD-10-CM | POA: Diagnosis not present

## 2012-12-30 DIAGNOSIS — N400 Enlarged prostate without lower urinary tract symptoms: Secondary | ICD-10-CM | POA: Diagnosis not present

## 2013-01-09 ENCOUNTER — Encounter: Payer: Self-pay | Admitting: Family Medicine

## 2013-01-14 ENCOUNTER — Other Ambulatory Visit: Payer: Self-pay | Admitting: Family Medicine

## 2013-01-14 MED ORDER — METOPROLOL TARTRATE 50 MG PO TABS: ORAL_TABLET | ORAL | Status: DC

## 2013-03-18 ENCOUNTER — Other Ambulatory Visit: Payer: Self-pay | Admitting: *Deleted

## 2013-03-18 MED ORDER — METFORMIN HCL 500 MG PO TABS: ORAL_TABLET | ORAL | Status: DC

## 2013-03-20 DIAGNOSIS — IMO0001 Reserved for inherently not codable concepts without codable children: Secondary | ICD-10-CM | POA: Diagnosis not present

## 2013-04-01 NOTE — Telephone Encounter (Signed)
Pt called to ck status or metformin refill; spoke with Melissa at [facility] [address] and rx ready for pick up. Pt notified.

## 2013-04-18 ENCOUNTER — Other Ambulatory Visit: Payer: Self-pay

## 2013-04-18 MED ORDER — METFORMIN HCL 500 MG PO TABS: ORAL_TABLET | ORAL | Status: DC

## 2013-04-18 MED ORDER — METOPROLOL TARTRATE 50 MG PO TABS: ORAL_TABLET | ORAL | Status: DC

## 2013-04-18 NOTE — Telephone Encounter (Signed)
Pt left v/m; pt said [facility] [address] requesting refills on metformin and metoprolol with no response; pt request med refilled today. pts last appt with Dr Deborra Medina was 07/08/12.Please advise. Pt request cb.

## 2013-04-21 DIAGNOSIS — F17200 Nicotine dependence, unspecified, uncomplicated: Secondary | ICD-10-CM | POA: Diagnosis not present

## 2013-04-21 DIAGNOSIS — E119 Type 2 diabetes mellitus without complications: Secondary | ICD-10-CM | POA: Diagnosis not present

## 2013-05-20 ENCOUNTER — Other Ambulatory Visit: Payer: Self-pay | Admitting: *Deleted

## 2013-05-20 MED ORDER — RAMIPRIL 10 MG PO CAPS: ORAL_CAPSULE | ORAL | Status: DC

## 2013-05-20 MED ORDER — ATORVASTATIN CALCIUM 20 MG PO TABS: ORAL_TABLET | ORAL | Status: DC

## 2013-05-23 NOTE — Telephone Encounter (Signed)
Pt said was at [address] this morning and was told our office had not responded to refill request for atorvastatin,ramipril and metformin. I spoke with Crystal at [facility] [address]; atorvastatin and ramipril are ready for pick up. Metformin was denied but pt is seeing Dr Gabriel Carina endocrinologist q 3months and Crystal will contact Dr Gabriel Carina about metformin refill if no success will call our office back. Pt notified and appreciative.

## 2013-05-26 ENCOUNTER — Other Ambulatory Visit: Payer: Self-pay | Admitting: *Deleted

## 2013-08-07 DIAGNOSIS — E119 Type 2 diabetes mellitus without complications: Secondary | ICD-10-CM | POA: Diagnosis not present

## 2013-08-11 DIAGNOSIS — F17200 Nicotine dependence, unspecified, uncomplicated: Secondary | ICD-10-CM | POA: Diagnosis not present

## 2013-08-11 DIAGNOSIS — IMO0001 Reserved for inherently not codable concepts without codable children: Secondary | ICD-10-CM | POA: Diagnosis not present

## 2013-09-08 DIAGNOSIS — E119 Type 2 diabetes mellitus without complications: Secondary | ICD-10-CM | POA: Diagnosis not present

## 2013-09-08 LAB — HM DIABETES EYE EXAM

## 2013-09-10 ENCOUNTER — Telehealth: Payer: Self-pay

## 2013-09-10 NOTE — Telephone Encounter (Signed)
Spoke to pt and advised him to contact endocrinologist

## 2013-09-10 NOTE — Telephone Encounter (Signed)
Pt left v/m; cost of levemir has doubled but size has remained the same. Spoke with Crystal at [facility] and last time pt got levemir was 07/02/2013 and cost was $74.00.pt wants to know what can do to decrease price.Pt last saw Dr Deborra Medina on 07/08/12. Last saw Dr Damita Dunnings on 12/03/12. Please advise.

## 2013-09-10 NOTE — Telephone Encounter (Signed)
Dr. Nilda Simmer (endocrinologist at [facility]) has been prescribing his Levemir.  He needs to contact her office.

## 2013-09-15 ENCOUNTER — Other Ambulatory Visit: Payer: Self-pay | Admitting: *Deleted

## 2013-09-15 MED ORDER — ATORVASTATIN CALCIUM 20 MG PO TABS: ORAL_TABLET | ORAL | Status: DC

## 2013-09-16 ENCOUNTER — Encounter: Payer: Self-pay | Admitting: Family Medicine

## 2013-09-23 ENCOUNTER — Other Ambulatory Visit: Payer: Self-pay | Admitting: *Deleted

## 2013-09-23 MED ORDER — RAMIPRIL 10 MG PO CAPS: ORAL_CAPSULE | ORAL | Status: DC

## 2013-09-26 DIAGNOSIS — F17200 Nicotine dependence, unspecified, uncomplicated: Secondary | ICD-10-CM | POA: Diagnosis not present

## 2013-09-26 DIAGNOSIS — E119 Type 2 diabetes mellitus without complications: Secondary | ICD-10-CM | POA: Diagnosis not present

## 2013-10-03 ENCOUNTER — Encounter: Payer: Self-pay | Admitting: Gastroenterology

## 2013-10-17 ENCOUNTER — Other Ambulatory Visit: Payer: Self-pay

## 2013-10-17 NOTE — Telephone Encounter (Signed)
Pt left v/m; pt called refill request on metoprolol and atorvastatin to [facility] on 10/13/13 and pt went to pick up med today and pt was advised by [facility] our office has not responded.Please advise. Pt request cb. Pt last seen 12/03/12 and no future appt scheduled.

## 2013-10-20 MED ORDER — ATORVASTATIN CALCIUM 20 MG PO TABS: ORAL_TABLET | ORAL | Status: DC

## 2013-10-20 MED ORDER — METOPROLOL TARTRATE 50 MG PO TABS: ORAL_TABLET | ORAL | Status: DC

## 2013-10-22 ENCOUNTER — Encounter: Payer: Self-pay | Admitting: *Deleted

## 2013-10-22 ENCOUNTER — Encounter: Payer: Self-pay | Admitting: Family Medicine

## 2013-10-22 ENCOUNTER — Ambulatory Visit (INDEPENDENT_AMBULATORY_CARE_PROVIDER_SITE_OTHER): Payer: Medicare Other | Admitting: Family Medicine

## 2013-10-22 VITALS — BP 138/74 | HR 76 | Temp 97.7°F | Ht 66.0 in | Wt 162.5 lb

## 2013-10-22 DIAGNOSIS — E119 Type 2 diabetes mellitus without complications: Secondary | ICD-10-CM | POA: Diagnosis not present

## 2013-10-22 DIAGNOSIS — I10 Essential (primary) hypertension: Secondary | ICD-10-CM

## 2013-10-22 DIAGNOSIS — E785 Hyperlipidemia, unspecified: Secondary | ICD-10-CM | POA: Diagnosis not present

## 2013-10-22 LAB — COMPREHENSIVE METABOLIC PANEL
ALBUMIN: 4.1 g/dL (ref 3.5–5.2)
ALK PHOS: 50 U/L (ref 39–117)
ALT: 14 U/L (ref 0–53)
AST: 15 U/L (ref 0–37)
BUN: 12 mg/dL (ref 6–23)
CO2: 27 mEq/L (ref 19–32)
Calcium: 9.2 mg/dL (ref 8.4–10.5)
Chloride: 105 mEq/L (ref 96–112)
Creatinine, Ser: 0.7 mg/dL (ref 0.4–1.5)
GFR: 111.52 mL/min (ref >60.00)
Glucose, Bld: 69 mg/dL — ABNORMAL LOW (ref 70–99)
POTASSIUM: 3.8 meq/L (ref 3.5–5.1)
SODIUM: 140 meq/L (ref 135–145)
Total Bilirubin: 0.6 mg/dL (ref 0.2–1.2)
Total Protein: 7.3 g/dL (ref 6.0–8.3)

## 2013-10-22 LAB — LIPID PANEL
CHOL/HDL RATIO: 3
Cholesterol: 120 mg/dL (ref 0–200)
HDL: 37.2 mg/dL — ABNORMAL LOW (ref >39.00)
LDL CALC: 59 mg/dL (ref 0–99)
NONHDL: 82.8
Triglycerides: 121 mg/dL (ref 0.0–149.0)
VLDL: 24.2 mg/dL (ref 0.0–40.0)

## 2013-10-22 LAB — HEMOGLOBIN A1C: Hgb A1c MFr Bld: 7.9 % — ABNORMAL HIGH (ref 4.6–6.5)

## 2013-10-22 MED ORDER — METOPROLOL TARTRATE 50 MG PO TABS: ORAL_TABLET | ORAL | Status: DC

## 2013-10-22 MED ORDER — ATORVASTATIN CALCIUM 20 MG PO TABS: ORAL_TABLET | ORAL | Status: DC

## 2013-10-22 NOTE — Assessment & Plan Note (Signed)
At goal for diabetic. No changes made today.

## 2013-10-22 NOTE — Progress Notes (Signed)
Pre visit review using our clinic review tool, if applicable. No additional management support is needed unless otherwise documented below in the visit note. 

## 2013-10-22 NOTE — Assessment & Plan Note (Signed)
Due for labs. He is on a statin. Orders Placed This Encounter  Procedures  . Lipid panel  . Comprehensive metabolic panel  . Hemoglobin A1c

## 2013-10-22 NOTE — Progress Notes (Signed)
Subjective:    Patient ID: Antonio [name], male    DOB: 03/07/1945, 68 y.o.   MRN: [id number]  HPI  68 yo pleasant male here for follow up. Had issues with dysphagia in 11/2012- saw Dr. Damita [name] initially and referred to GI. Saw Antonio [name] on 12/06/12- note reviewed. Advised PPI, if symptoms not resolved, endoscopy. Symptoms resolved so he did not go through with endoscopy.  DM- followed by Dr. Nilda [name].  Last saw her on 12/26/12.   Notes reviewed.  Restarted meds and DM much improved! Checking CBGs every other day.  Was 108 this am.  Taking Levemir 20 units qhs, Metformin 500 mg twice daily, pioglitazone 25 mg daily and added glipizide 5 mg at bedtime. Lab Results  Component Value Date   HGBA1C 7.9* 07/08/2012    Denies any episodes of hypoglycemia. On ACEI.   Wt Readings from Last 3 Encounters:  10/22/13 162 lb 8 oz (73.71 kg)  12/06/12 159 lb 9.6 oz (72.394 kg)  12/03/12 158 lb 8 oz (71.895 kg)    HTN- has been stable on Metoprolol.  Denies any HA, blurred vision, CP or LE edema. Lab Results  Component Value Date   CREATININE 0.7 07/08/2012    HLD- on fenofibrate and Atorvastatin. Denies any myalgias.  Lab Results  Component Value Date   CHOL 184 07/08/2012   HDL 45.40 07/08/2012   LDLCALC 108* 07/08/2012   LDLDIRECT 120.3 04/24/2011   TRIG 151.0* 07/08/2012   CHOLHDL 4 07/08/2012     Patient Active Problem List   Diagnosis Date Noted  . Other dysphagia 12/04/2012  . Bladder outlet obstruction 08/10/2011  . Weight loss, abnormal 04/24/2011  . ESOPHAGEAL STRICTURE 12/26/2006  . GASTRITIS 12/26/2006  . HYPERTENSION, BENIGN ESSENTIAL 11/19/2006  . PSA, INCREASED 11/19/2006  . DIABETES MELLITUS, TYPE II 06/27/2006  . HYPERLIPIDEMIA 06/27/2006  . CORONARY ARTERY DISEASE 06/27/2006  . HEMORRHOIDS, INTERNAL 06/27/2006  . LOW BACK PAIN, CHRONIC 06/27/2006  . TOBACCO ABUSE, HX OF 06/27/2006   Past Medical History  Diagnosis Date  . Hypertension   . Hyperlipidemia   .  Diabetes mellitus     Type II  . Gastritis   . Esophageal stricture   . URI (upper respiratory infection)   . PSA elevation     now averaging 2's  . Internal hemorrhoids without mention of complication   . Lumbago   . Tobacco abuse   . Coronary atherosclerosis of unspecified type of vessel, native or graft   . Hx of colonic polyps   . BPH with urinary obstruction     stable on flomax (Antonio [name])   Past Surgical History  Procedure Laterality Date  . Knee arthroscopy  10/00    Right  . Stress myoview  07/06    EF 67%  . 2d echo  06/03  . Ct of head  and sinuses  06/03    Negative  . Lp  06/03  . Abdominal ultrasound  06/03    Negative  . Ct of the chest, abdomen, pelvis  06/03    Chest negative; Abdomen, left adrenal lesion; Pelvis, enlarged prostate  . Persantine cardiolite  03/01    EF 68%  . Esophagogastroduodenoscopy  02/13/07    gastritis and duodenitis without bleed  . Microwave thermotherapy prostate  08/28/07    Antonio [name]  . Polypectomy  11/95   History  Substance Use Topics  . Smoking status: Current Every Day Smoker -- 1.00 packs/day for 40 years    Types:  Cigarettes  . Smokeless tobacco: Never Used  . Alcohol Use: Yes     Comment: rarely   Family History  Problem Relation Age of Onset  . Diabetes Father   . Alcohol abuse Paternal Uncle   . Heart disease Neg Hx   . Stroke Neg Hx   . Cancer Neg Hx   . Drug abuse Neg Hx   . Depression Neg Hx   . Alcohol abuse Paternal Uncle   . Alcohol abuse Paternal Uncle   . Rheum arthritis Mother   . Colon cancer Neg Hx    No Known Allergies Current Outpatient Prescriptions on File Prior to Visit  Medication Sig Dispense Refill  . aspirin 325 MG tablet Take 1 tablet (325 mg total) by mouth daily.      . insulin detemir (LEVEMIR) 100 UNIT/ML injection Inject 20 units at bedtime.  10 pen  5  . metFORMIN (GLUCOPHAGE) 500 MG tablet TAKE TWO TABLETS BY MOUTH TWICE DAILY WITH A MEAL  120 tablet  0  . ramipril (ALTACE) 10  MG capsule TAKE ONE CAPSULE BY MOUTH EVERY DAY  30 capsule  0  . Tamsulosin HCl (FLOMAX) 0.4 MG CAPS Take 1 capsule (0.4 mg total) by mouth daily.  30 capsule  3   No current facility-administered medications on file prior to visit.   The PMH, PSH, Social History, Family History, Medications, and allergies have been reviewed in [facility], and have been updated if relevant.  Review of Systems See HPI   No nausea or vomiting No difficulty urinating No difficulty swallowing Objective:   Physical Exam BP 138/74  Pulse 76  Temp(Src) 97.7 F (36.5 C) (Oral)  Ht 5\' 6"  (1.676 m)  Wt 162 lb 8 oz (73.71 kg)  BMI 26.24 kg/m2  SpO2 98% Wt Readings from Last 3 Encounters:  10/22/13 162 lb 8 oz (73.71 kg)  12/06/12 159 lb 9.6 oz (72.394 kg)  12/03/12 158 lb 8 oz (71.895 kg)  General:  Pleasant male in NAD Eyes:  PERRL Ears:  External ear exam shows no significant lesions or deformities.  Otoscopic examination reveals clear canals, tympanic membranes are intact bilaterally without bulging, retraction, inflammation or discharge. Hearing is grossly normal bilaterally. Nose:  External nasal examination shows no deformity or inflammation. Nasal mucosa are pink and moist without lesions or exudates. Mouth:  Oral mucosa and oropharynx without lesions or exudates.  Teeth in good repair. Neck:  no carotid bruit or thyromegaly no cervical or supraclavicular lymphadenopathy  Lungs:  Normal respiratory effort, chest expands symmetrically. Lungs are clear to auscultation, no crackles or wheezes. Heart:  Normal rate and regular rhythm. S1 and S2 normal without gallop, murmur, click, rub or other extra sounds. Abdomen:  Bowel sounds positive,abdomen soft and non-tender without masses, organomegaly or hernias noted. Extremities:  no edema     Assessment & Plan:

## 2013-10-22 NOTE — Patient Instructions (Signed)
Great to see you.  We will call you with your lab results. 

## 2013-10-22 NOTE — Assessment & Plan Note (Signed)
Seems to be controlled now with addition of glipizide. Will recheck a1c today and forward results to Dr. Gabriel Carina.

## 2013-11-14 ENCOUNTER — Encounter: Payer: Self-pay | Admitting: Gastroenterology

## 2013-11-18 ENCOUNTER — Other Ambulatory Visit: Payer: Self-pay | Admitting: *Deleted

## 2013-11-18 MED ORDER — RAMIPRIL 10 MG PO CAPS: ORAL_CAPSULE | ORAL | Status: DC

## 2013-11-19 NOTE — Telephone Encounter (Signed)
Pt request status of ramipril;Christy at [facility] said rx ready for pick up. Pt notified and will ck with pharmacy.

## 2014-01-08 DIAGNOSIS — E119 Type 2 diabetes mellitus without complications: Secondary | ICD-10-CM | POA: Diagnosis not present

## 2014-01-15 DIAGNOSIS — F17200 Nicotine dependence, unspecified, uncomplicated: Secondary | ICD-10-CM | POA: Diagnosis not present

## 2014-01-15 DIAGNOSIS — E119 Type 2 diabetes mellitus without complications: Secondary | ICD-10-CM | POA: Diagnosis not present

## 2014-01-15 DIAGNOSIS — I10 Essential (primary) hypertension: Secondary | ICD-10-CM | POA: Diagnosis not present

## 2014-01-21 ENCOUNTER — Telehealth: Payer: Self-pay | Admitting: *Deleted

## 2014-01-21 NOTE — Telephone Encounter (Signed)
Report received from [facility] indicating pt had not received influenza and pneumonia vaccinations. Per Dr Deborra Medina, pt was contacted and asked if he was wanting to be placed on schedule to receive; pt denied and health maintenance updated

## 2014-01-23 ENCOUNTER — Encounter: Payer: Self-pay | Admitting: Family Medicine

## 2014-03-11 ENCOUNTER — Other Ambulatory Visit: Payer: Self-pay | Admitting: *Deleted

## 2014-03-11 MED ORDER — RAMIPRIL 10 MG PO CAPS: ORAL_CAPSULE | ORAL | Status: DC

## 2014-04-09 ENCOUNTER — Encounter: Payer: Self-pay | Admitting: Gastroenterology

## 2014-05-05 ENCOUNTER — Other Ambulatory Visit: Payer: Self-pay | Admitting: *Deleted

## 2014-05-05 MED ORDER — METOPROLOL TARTRATE 50 MG PO TABS: ORAL_TABLET | ORAL | Status: DC

## 2014-05-05 MED ORDER — ATORVASTATIN CALCIUM 20 MG PO TABS: ORAL_TABLET | ORAL | Status: DC

## 2014-05-07 ENCOUNTER — Telehealth: Payer: Self-pay

## 2014-05-07 NOTE — Telephone Encounter (Signed)
Pt called for refill ramipril to [facility] [address]. Advised [facility] to [facility] 03/2014. Spoke with Brandy at [facility] and will get refill ready. Pt voiced understanding.

## 2014-05-12 DIAGNOSIS — E119 Type 2 diabetes mellitus without complications: Secondary | ICD-10-CM | POA: Diagnosis not present

## 2014-05-19 DIAGNOSIS — F17200 Nicotine dependence, unspecified, uncomplicated: Secondary | ICD-10-CM | POA: Diagnosis not present

## 2014-05-19 DIAGNOSIS — E1142 Type 2 diabetes mellitus with diabetic polyneuropathy: Secondary | ICD-10-CM | POA: Diagnosis not present

## 2014-06-05 ENCOUNTER — Other Ambulatory Visit: Payer: Self-pay | Admitting: *Deleted

## 2014-06-05 MED ORDER — ATORVASTATIN CALCIUM 20 MG PO TABS: ORAL_TABLET | ORAL | Status: DC

## 2014-06-05 NOTE — Telephone Encounter (Signed)
OV 10/22/13, labs checked at that time.

## 2014-06-13 ENCOUNTER — Encounter: Payer: Self-pay | Admitting: Emergency Medicine

## 2014-06-13 ENCOUNTER — Emergency Department
Admission: EM | Admit: 2014-06-13 | Discharge: 2014-06-13 | Disposition: A | Discharge status: Home / Self Care | Payer: Medicare Other | Attending: Emergency Medicine | Admitting: Emergency Medicine

## 2014-06-13 DIAGNOSIS — G8929 Other chronic pain: Secondary | ICD-10-CM | POA: Diagnosis not present

## 2014-06-13 DIAGNOSIS — Z7982 Long term (current) use of aspirin: Secondary | ICD-10-CM | POA: Diagnosis not present

## 2014-06-13 DIAGNOSIS — I10 Essential (primary) hypertension: Secondary | ICD-10-CM | POA: Insufficient documentation

## 2014-06-13 DIAGNOSIS — Z79899 Other long term (current) drug therapy: Secondary | ICD-10-CM | POA: Diagnosis not present

## 2014-06-13 DIAGNOSIS — E119 Type 2 diabetes mellitus without complications: Secondary | ICD-10-CM | POA: Diagnosis not present

## 2014-06-13 DIAGNOSIS — Z720 Tobacco use: Secondary | ICD-10-CM | POA: Diagnosis not present

## 2014-06-13 DIAGNOSIS — Z794 Long term (current) use of insulin: Secondary | ICD-10-CM | POA: Diagnosis not present

## 2014-06-13 DIAGNOSIS — R339 Retention of urine, unspecified: Secondary | ICD-10-CM

## 2014-06-13 LAB — URINALYSIS COMPLETE WITH MICROSCOPIC (ARMC ONLY)
BACTERIA UA: NONE SEEN
BILIRUBIN URINE: NEGATIVE
GLUCOSE, UA: NEGATIVE mg/dL
HGB URINE DIPSTICK: NEGATIVE
Ketones, ur: NEGATIVE mg/dL
LEUKOCYTES UA: NEGATIVE
NITRITE: NEGATIVE
PH: 5 (ref 5.0–8.0)
Protein, ur: NEGATIVE mg/dL
Specific Gravity, Urine: 1.013 (ref 1.005–1.030)

## 2014-06-13 MED ORDER — TAMSULOSIN HCL 0.4 MG PO CAPS: 0.4000 mg | ORAL_CAPSULE | Freq: Every day | ORAL | Status: DC

## 2014-06-13 MED ORDER — CIPROFLOXACIN HCL 500 MG PO TABS: 500.0000 mg | ORAL_TABLET | Freq: Two times a day (BID) | ORAL | Status: AC

## 2014-06-13 NOTE — ED Provider Notes (Addendum)
[facility] Emergency Department Provider Note  ____________________________________________  Time seen: 1:45 PM  I have reviewed the triage vital signs and the nursing notes.   HISTORY  Chief Complaint Urinary Retention    HPI Antonio [name] is a 69 y.o. male who presents with urinary retention. Patient was last able to urinate at 1 AM. He complains of lower abdominal distention and tightness and pain that is 7 out of 10. He has had this in the past 1 year ago but it was never discovered what caused it. He denies fevers chills, no nausea no vomiting. Normal stools    Past Medical History  Diagnosis Date  . Hypertension   . Hyperlipidemia   . Diabetes mellitus     Type II  . Gastritis   . Esophageal stricture   . URI (upper respiratory infection)   . PSA elevation     now averaging 2's  . Internal hemorrhoids without mention of complication   . Lumbago   . Tobacco abuse   . Coronary atherosclerosis of unspecified type of vessel, native or graft   . Hx of colonic polyps   . 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 Other dysphagia 12/04/2012  . Bladder outlet obstruction 08/10/2011  . Weight loss, abnormal 04/24/2011  . ESOPHAGEAL STRICTURE 12/26/2006  . GASTRITIS 12/26/2006  . HYPERTENSION, BENIGN ESSENTIAL 11/19/2006  . PSA, INCREASED 11/19/2006  . DIABETES MELLITUS, TYPE II 06/27/2006  . HYPERLIPIDEMIA 06/27/2006  . CORONARY ARTERY DISEASE 06/27/2006  . HEMORRHOIDS, INTERNAL 06/27/2006  . LOW BACK PAIN, CHRONIC 06/27/2006  . TOBACCO ABUSE, HX OF 06/27/2006    Past Surgical History  Procedure Laterality Date  . Knee arthroscopy  10/00    Right  . Stress myoview  07/06    EF 67%  . 2d echo  06/03  . Ct of head  and sinuses  06/03    Negative  . Lp  06/03  . Abdominal ultrasound  06/03    Negative  . Ct of the chest, abdomen, pelvis  06/03    Chest  negative; Abdomen, left adrenal lesion; Pelvis, enlarged prostate  . Persantine cardiolite  03/01    EF 68%  . Esophagogastroduodenoscopy  02/13/07    gastritis and duodenitis without bleed  . Microwave thermotherapy prostate  08/28/07    Rogers Blocker  . Polypectomy  11/95    Current Outpatient Rx  Name  Route  Sig  Dispense  Refill  . aspirin 325 MG tablet   Oral   Take 1 tablet (325 mg total) by mouth daily.         [name] atorvastatin (LIPITOR) 20 MG tablet      TAKE ONE TABLET BY MOUTH EVERY DAY   30 tablet   5     Office visit with labs required for additional ref ...   . glipiZIDE (GLUCOTROL) 5 MG tablet   Oral   Take 5 mg by mouth daily before breakfast.         . insulin detemir (LEVEMIR) 100 UNIT/ML injection      Inject 20 units at bedtime.   10 pen   5   . metFORMIN (GLUCOPHAGE) 500 MG tablet      TAKE TWO TABLETS BY MOUTH TWICE DAILY WITH A MEAL   120 tablet   0     NEEDS OFFICE VISIT FOR ADDITIONAL REFILLS   .  metoprolol (LOPRESSOR) 50 MG tablet      Take 1/2 tablet by mouth twice daily.   30 tablet   7   . ramipril (ALTACE) 10 MG capsule      TAKE ONE CAPSULE BY MOUTH EVERY DAY   30 capsule   8   . Tamsulosin HCl (FLOMAX) 0.4 MG CAPS   Oral   Take 1 capsule (0.4 mg total) by mouth daily.   30 capsule   3     Allergies Review of patient's allergies indicates no known allergies.  Family History  Problem Relation Age of Onset  . Diabetes Father   . Alcohol abuse Paternal Uncle   . Heart disease Neg Hx   . Stroke Neg Hx   . Cancer Neg Hx   . Drug abuse Neg Hx   . Depression Neg Hx   . Alcohol abuse Paternal Uncle   . Alcohol abuse Paternal Uncle   . Rheum arthritis Mother   . Colon cancer Neg Hx     Social History History  Substance Use Topics  . Smoking status: Current Every Day Smoker -- 1.00 packs/day for 40 years    Types: Cigarettes  . Smokeless tobacco: Never Used  . Alcohol Use: Yes     Comment: rarely    Review of  Systems  Constitutional: Negative for fever. Eyes: Negative for visual changes. ENT: Negative for sore throat. Cardiovascular: Negative for chest pain. Respiratory: Negative for shortness of breath. Gastrointestinal: Negative for abdominal pain, vomiting and diarrhea. Genitourinary: Unable to urinate Musculoskeletal: Negative for back pain. Skin: Negative for rash. Neurological: Negative for headaches, focal weakness or numbness.   10-point ROS otherwise negative.  ____________________________________________   PHYSICAL EXAM:  VITAL SIGNS: ED Triage Vitals  Enc Vitals Group     BP 06/13/14 1136 200/98 mmHg     Pulse Rate 06/13/14 1136 92     Resp 06/13/14 1136 22     Temp 06/13/14 1136 97.5 F (36.4 C)     Temp Source 06/13/14 1136 Oral     SpO2 06/13/14 1136 99 %     Weight 06/13/14 1136 165 lb (74.844 kg)     Height 06/13/14 1136 '5\' 7"'$  (1.702 m)     Head Cir --      Peak Flow --      Pain Score 06/13/14 1137 8     Pain Loc --      Pain Edu? --      Excl. in [place]? --      Constitutional: Alert and oriented. Well appearing and in no distress. Pleasant Eyes: Conjunctivae are normal. PERRL. Normal extraocular movements. ENT   Head: Normocephalic and atraumatic.   Nose: No congestion/rhinnorhea.   Mouth/Throat: Mucous membranes are moist.   Neck: No stridor. Hematological/Lymphatic/Immunilogical: No cervical lymphadenopathy. Cardiovascular: Normal rate, regular rhythm. Normal and symmetric distal pulses are present in all extremities. No murmurs, rubs, or gallops. Respiratory: Normal respiratory effort without tachypnea nor retractions. Breath sounds are clear and equal bilaterally. No wheezes/rales/rhonchi. Gastrointestinal: Soft and nontender. No distention. There is no CVA tenderness. Genitourinary: Nurse inserted Foley in triage patient had immediate relief. No scrotal swelling Musculoskeletal: Nontender with normal range of motion in all extremities.  No joint effusions.  No lower extremity tenderness nor edema. Neurologic:  Normal speech and language. No gross focal neurologic deficits are appreciated. Speech is normal.  Skin:  Skin is warm, dry and intact. No rash noted. Psychiatric: Mood and affect are normal. Speech and  behavior are normal. Patient exhibits appropriate insight and judgment.  ____________________________________________    LABS (pertinent positives/negatives)  Urinalysis unremarkable  ____________________________________________   EKG  None  ____________________________________________    RADIOLOGY  None  ____________________________________________   PROCEDURES  Procedure(s) performed: Foley catheter inserted by nurse without difficulty  Critical Care performed: None    ____________________________________________   INITIAL IMPRESSION / ASSESSMENT AND PLAN / ED COURSE  Pertinent labs & imaging results that were available during my care of the patient were reviewed by me and considered in my medical decision making (see chart for details).  History of present illness consistent with urinary retention likely related to BPH. Patient has had this happen before and has appropriate follow up with [facility]. We'll start the patient on Flomax by mouth and also Cipro. Discharge home with Foley and leg bag  ____________________________________________   FINAL CLINICAL IMPRESSION(S) / ED DIAGNOSES  Final diagnoses:  Urinary retention     Lavonia Drafts, MD 06/13/14 1359  Lavonia Drafts, MD 06/26/14 [phone]

## 2014-06-13 NOTE — ED Notes (Signed)
Patient to ED with c/o bladder fullness, reports no urination since around 1am, has had this happen before and had to be catherized.

## 2014-06-13 NOTE — ED Notes (Signed)
Patient instructed re use of foley leg bag, verbalized understanding.

## 2014-06-13 NOTE — Discharge Instructions (Signed)
Acute Urinary Retention  Acute urinary retention is when you are unable to pee (urinate). Acute urinary retention is common in older men. Prostates can get bigger, which blocks the flow of pee.   HOME CARE  · Drink enough fluids to keep your pee clear or pale yellow.  · If you are sent home with a tube that drains the bladder (catheter), there will be a drainage bag attached to it. There are two types of bags. One is big that you can wear at night without having to empty it. One is smaller and needs to be emptied more often.  ¨ Keep the drainage bag empty.  ¨ Keep the drainage bag lower than your catheter.  · Only take medicine as told by your doctor.  GET HELP IF:  · You have a low-grade fever.  · You have spasms or you are leaking pee when you have spasms.  GET HELP RIGHT AWAY IF:   · You have chills or a fever.  · Your catheter stops draining pee.  · Your catheter falls out.  · You have increased bleeding that does not stop after you have rested and increased the amount of fluids you had been drinking.  MAKE SURE YOU:   · Understand these instructions.  · Will watch your condition.  · Will get help right away if you are not doing well or get worse.  Document Released: 07/12/2007 Document Revised: 11/13/2012 Document Reviewed: 07/04/2012  [employer]® Patient Information ©2015 [employer]. This information is not intended to replace advice given to you by your health care provider. Make sure you discuss any questions you have with your health care provider.

## 2014-06-14 ENCOUNTER — Encounter: Payer: Self-pay | Admitting: Emergency Medicine

## 2014-06-14 ENCOUNTER — Emergency Department
Admission: EM | Admit: 2014-06-14 | Discharge: 2014-06-14 | Disposition: A | Discharge status: Home / Self Care | Payer: Medicare Other | Attending: Emergency Medicine | Admitting: Emergency Medicine

## 2014-06-14 DIAGNOSIS — Z79899 Other long term (current) drug therapy: Secondary | ICD-10-CM | POA: Diagnosis not present

## 2014-06-14 DIAGNOSIS — Z7982 Long term (current) use of aspirin: Secondary | ICD-10-CM | POA: Insufficient documentation

## 2014-06-14 DIAGNOSIS — R319 Hematuria, unspecified: Secondary | ICD-10-CM | POA: Insufficient documentation

## 2014-06-14 DIAGNOSIS — I10 Essential (primary) hypertension: Secondary | ICD-10-CM | POA: Diagnosis not present

## 2014-06-14 DIAGNOSIS — E119 Type 2 diabetes mellitus without complications: Secondary | ICD-10-CM | POA: Insufficient documentation

## 2014-06-14 DIAGNOSIS — Z720 Tobacco use: Secondary | ICD-10-CM | POA: Diagnosis not present

## 2014-06-14 LAB — URINALYSIS COMPLETE WITH MICROSCOPIC (ARMC ONLY)
Bilirubin Urine: NEGATIVE
Glucose, UA: 50 mg/dL — AB
KETONES UR: NEGATIVE mg/dL
LEUKOCYTES UA: NEGATIVE
NITRITE: NEGATIVE
PROTEIN: 100 mg/dL — AB
Specific Gravity, Urine: 1.026 (ref 1.005–1.030)
Squamous Epithelial / LPF: NONE SEEN
pH: 6 (ref 5.0–8.0)

## 2014-06-14 NOTE — ED Notes (Signed)
Pt reports being seen yesterday for inability to void. Pt had a catheter and urine bag placed. Pt reports bloody urine since today. Denies pain or fever.

## 2014-06-14 NOTE — ED Provider Notes (Signed)
[facility] Emergency Department Provider Note    ____________________________________________  Time seen: 2010  I have reviewed the triage vital signs and the nursing notes.   HISTORY  Chief Complaint Hematuria   History limited by: Not Limited   HPI Antonio [name] is a 69 y.o. male who presents to the emergency department because of concerns for hematuria. The patient was seen in the emergency department yesterday for urinary retention at that time a Foley catheter was placed. Patient was discharged on Cipro and Flomax. Patient stated that he started having hematuria yesterday after the Foley insertion and that has progressively gotten worse. He has had associated leakage around the catheter tubing. He denies any fevers, chest pain, shortness of breath.    Past Medical History  Diagnosis Date  . Hypertension   . Hyperlipidemia   . Diabetes mellitus     Type II  . Gastritis   . Esophageal stricture   . URI (upper respiratory infection)   . PSA elevation     now averaging 2's  . Internal hemorrhoids without mention of complication   . Lumbago   . Tobacco abuse   . Coronary atherosclerosis of unspecified type of vessel, native or graft   . Hx of colonic polyps   . 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 Other dysphagia 12/04/2012  . Bladder outlet obstruction 08/10/2011  . Weight loss, abnormal 04/24/2011  . ESOPHAGEAL STRICTURE 12/26/2006  . GASTRITIS 12/26/2006  . HYPERTENSION, BENIGN ESSENTIAL 11/19/2006  . PSA, INCREASED 11/19/2006  . DIABETES MELLITUS, TYPE II 06/27/2006  . HYPERLIPIDEMIA 06/27/2006  . CORONARY ARTERY DISEASE 06/27/2006  . HEMORRHOIDS, INTERNAL 06/27/2006  . LOW BACK PAIN, CHRONIC 06/27/2006  . TOBACCO ABUSE, HX OF 06/27/2006    Past Surgical History  Procedure Laterality Date  . Knee arthroscopy  10/00    Right  . Stress myoview   07/06    EF 67%  . 2d echo  06/03  . Ct of head  and sinuses  06/03    Negative  . Lp  06/03  . Abdominal ultrasound  06/03    Negative  . Ct of the chest, abdomen, pelvis  06/03    Chest negative; Abdomen, left adrenal lesion; Pelvis, enlarged prostate  . Persantine cardiolite  03/01    EF 68%  . Esophagogastroduodenoscopy  02/13/07    gastritis and duodenitis without bleed  . Microwave thermotherapy prostate  08/28/07    Rogers Blocker  . Polypectomy  11/95    Current Outpatient Rx  Name  Route  Sig  Dispense  Refill  . aspirin 325 MG tablet   Oral   Take 1 tablet (325 mg total) by mouth daily.         [name] atorvastatin (LIPITOR) 20 MG tablet      TAKE ONE TABLET BY MOUTH EVERY DAY   30 tablet   5     Office visit with labs required for additional ref ...   . ciprofloxacin (CIPRO) 500 MG tablet   Oral   Take 1 tablet (500 mg total) by mouth 2 (two) times daily.   20 tablet   0   . glipiZIDE (GLUCOTROL) 5 MG tablet   Oral   Take 5 mg by mouth daily before breakfast.         . insulin detemir (LEVEMIR) 100 UNIT/ML injection  Inject 20 units at bedtime.   10 pen   5   . metFORMIN (GLUCOPHAGE) 500 MG tablet      TAKE TWO TABLETS BY MOUTH TWICE DAILY WITH A MEAL   120 tablet   0     NEEDS OFFICE VISIT FOR ADDITIONAL REFILLS   . metoprolol (LOPRESSOR) 50 MG tablet      Take 1/2 tablet by mouth twice daily.   30 tablet   7   . ramipril (ALTACE) 10 MG capsule      TAKE ONE CAPSULE BY MOUTH EVERY DAY   30 capsule   8   . tamsulosin (FLOMAX) 0.4 MG CAPS capsule   Oral   Take 1 capsule (0.4 mg total) by mouth daily.   30 capsule   3     Allergies Review of patient's allergies indicates no known allergies.  Family History  Problem Relation Age of Onset  . Diabetes Father   . Alcohol abuse Paternal Uncle   . Heart disease Neg Hx   . Stroke Neg Hx   . Cancer Neg Hx   . Drug abuse Neg Hx   . Depression Neg Hx   . Alcohol abuse Paternal Uncle   .  Alcohol abuse Paternal Uncle   . Rheum arthritis Mother   . Colon cancer Neg Hx     Social History History  Substance Use Topics  . Smoking status: Current Every Day Smoker -- 1.00 packs/day for 40 years    Types: Cigarettes  . Smokeless tobacco: Never Used  . Alcohol Use: Yes     Comment: rarely    Review of Systems  Constitutional: Negative for fever. Cardiovascular: Negative for chest pain. Respiratory: Negative for shortness of breath. Gastrointestinal: Negative for abdominal pain, vomiting and diarrhea. Genitourinary: Positive for urinary retention and hematuria Musculoskeletal: Negative for back pain. Skin: Negative for rash. Neurological: Negative for headaches, focal weakness or numbness.  10-point ROS otherwise negative.  ____________________________________________   PHYSICAL EXAM:  VITAL SIGNS: ED Triage Vitals  Enc Vitals Group     BP 06/14/14 1712 120/65 mmHg     Pulse Rate 06/14/14 1712 95     Resp 06/14/14 1712 20     Temp 06/14/14 1712 98 F (36.7 C)     Temp Source 06/14/14 1712 Oral     SpO2 06/14/14 1712 96 %     Weight 06/14/14 1712 160 lb (72.576 kg)     Height 06/14/14 1712 '5\' 7"'$  (1.702 m)   Constitutional: Alert and oriented. Well appearing and in no distress. Eyes: Conjunctivae are normal. PERRL. Normal extraocular movements. ENT   Head: Normocephalic and atraumatic.   Nose: No congestion/rhinnorhea.   Mouth/Throat: Mucous membranes are moist.   Neck: No stridor. Hematological/Lymphatic/Immunilogical: No cervical lymphadenopathy. Cardiovascular: Normal rate, regular rhythm.  No murmurs, rubs, or gallops. Respiratory: Normal respiratory effort without tachypnea nor retractions. Breath sounds are clear and equal bilaterally. No wheezes/rales/rhonchi. Gastrointestinal: Soft and nontender. No distention.  Genitourinary: Deferred Musculoskeletal: Normal range of motion in all extremities.  Neurologic:  Normal speech and  language. No gross focal neurologic deficits are appreciated. Speech is normal.  Skin:  Skin is warm, dry and intact. No rash noted. Psychiatric: Mood and affect are normal. Speech and behavior are normal. Patient exhibits appropriate insight and judgment.  ____________________________________________    LABS (pertinent positives/negatives)  Labs Reviewed  URINALYSIS COMPLETEWITH MICROSCOPIC ([facility])  - Abnormal; Notable for the following:    Color, Urine RED (*)  APPearance CLOUDY (*)    Glucose, UA 50 (*)    Hgb urine dipstick 3+ (*)    Protein, ur 100 (*)    Bacteria, UA MANY (*)    All other components within normal limits     ____________________________________________   EKG  None  ____________________________________________    RADIOLOGY  None  ____________________________________________   PROCEDURES  Procedure(s) performed: None  Critical Care performed: No  ____________________________________________   INITIAL IMPRESSION / ASSESSMENT AND PLAN / ED COURSE  Pertinent labs & imaging results that were available during my care of the patient were reviewed by me and considered in my medical decision making (see chart for details).  Patient here with hematuria after Foley placement yesterday for urinary retention.  On exam patient awake, alert no acute distress.  Abdomen working with nurses to find the appropriate catheter size. Original catheter appears to have had a blood clot in the tubing.  ----------------------------------------- 8:59 PM on 06/14/2014 -----------------------------------------  Patient now with Foley in place. Denies any further leakage around the Foley. Does have some urine in the bag with hematuria. Discussed with patient that he should follow-up with his urologist. Will discharge home.  ____________________________________________   FINAL CLINICAL IMPRESSION(S) / ED DIAGNOSES  Final diagnoses:  Hematuria      Nance Pear, MD 06/14/14 2059

## 2014-06-14 NOTE — Discharge Instructions (Signed)
Please seek medical attention for any high fevers, chest pain, shortness of breath, change in behavior, persistent vomiting, bloody stool or any other new or concerning symptoms.  Hematuria Hematuria is blood in your urine. It can be caused by a bladder infection, kidney infection, prostate infection, kidney stone, or cancer of your urinary tract. Infections can usually be treated with medicine, and a kidney stone usually will pass through your urine. If neither of these is the cause of your hematuria, further workup to find out the reason may be needed. It is very important that you tell your health care provider about any blood you see in your urine, even if the blood stops without treatment or happens without causing pain. Blood in your urine that happens and then stops and then happens again can be a symptom of a very serious condition. Also, pain is not a symptom in the initial stages of many urinary cancers. HOME CARE INSTRUCTIONS   Drink lots of fluid, 3-4 quarts a day. If you have been diagnosed with an infection, cranberry juice is especially recommended, in addition to large amounts of water.  Avoid caffeine, tea, and carbonated beverages because they tend to irritate the bladder.  Avoid alcohol because it may irritate the prostate.  Take all medicines as directed by your health care provider.  If you were prescribed an antibiotic medicine, finish it all even if you start to feel better.  If you have been diagnosed with a kidney stone, follow your health care provider's instructions regarding straining your urine to catch the stone.  Empty your bladder often. Avoid holding urine for long periods of time.  After a bowel movement, women should cleanse front to back. Use each tissue only once.  Empty your bladder before and after sexual intercourse if you are a male. SEEK MEDICAL CARE IF:  You develop back pain.  You have a fever.  You have a feeling of sickness in your stomach  (nausea) or vomiting.  Your symptoms are not better in 3 days. Return sooner if you are getting worse. SEEK IMMEDIATE MEDICAL CARE IF:   You develop severe vomiting and are unable to keep the medicine down.  You develop severe back or abdominal pain despite taking your medicines.  You begin passing a large amount of blood or clots in your urine.  You feel extremely weak or faint, or you pass out. MAKE SURE YOU:   Understand these instructions.  Will watch your condition.  Will get help right away if you are not doing well or get worse. Document Released: 01/23/2005 Document Revised: 06/09/2013 Document Reviewed: 09/23/2012 [facility] Patient Information 2015 [place], [place]. This information is not intended to replace advice given to you by your health care provider. Make sure you discuss any questions you have with your health care provider.

## 2014-06-14 NOTE — ED Notes (Signed)
Pt had foley placed yesterday from [facility] for urinary retention. Sent home. States catheter started leaking today. States had blood in it yesterday - same today

## 2014-06-14 NOTE — ED Notes (Signed)
Attempted to irrigate pt's indwelling foley - fluid went around the catheter and out penis. Checked balloon - at 10cc as indicted by foley instructions. Foley d/c'd - had a blood clot in tip that could not be dislodged with irrigation. New foley inserted as ordered. Pt tolerated well.

## 2014-06-17 DIAGNOSIS — R310 Gross hematuria: Secondary | ICD-10-CM | POA: Diagnosis not present

## 2014-06-17 DIAGNOSIS — R339 Retention of urine, unspecified: Secondary | ICD-10-CM | POA: Diagnosis not present

## 2014-06-18 DIAGNOSIS — R339 Retention of urine, unspecified: Secondary | ICD-10-CM | POA: Diagnosis not present

## 2014-06-20 ENCOUNTER — Emergency Department
Admission: EM | Admit: 2014-06-20 | Discharge: 2014-06-20 | Disposition: A | Discharge status: Home / Self Care | Payer: Medicare Other | Attending: Emergency Medicine | Admitting: Emergency Medicine

## 2014-06-20 ENCOUNTER — Encounter: Payer: Self-pay | Admitting: Emergency Medicine

## 2014-06-20 DIAGNOSIS — Z792 Long term (current) use of antibiotics: Secondary | ICD-10-CM | POA: Insufficient documentation

## 2014-06-20 DIAGNOSIS — Z720 Tobacco use: Secondary | ICD-10-CM | POA: Diagnosis not present

## 2014-06-20 DIAGNOSIS — E119 Type 2 diabetes mellitus without complications: Secondary | ICD-10-CM | POA: Diagnosis not present

## 2014-06-20 DIAGNOSIS — Z794 Long term (current) use of insulin: Secondary | ICD-10-CM | POA: Insufficient documentation

## 2014-06-20 DIAGNOSIS — Z7982 Long term (current) use of aspirin: Secondary | ICD-10-CM | POA: Diagnosis not present

## 2014-06-20 DIAGNOSIS — R339 Retention of urine, unspecified: Secondary | ICD-10-CM | POA: Diagnosis present

## 2014-06-20 DIAGNOSIS — I10 Essential (primary) hypertension: Secondary | ICD-10-CM | POA: Diagnosis not present

## 2014-06-20 DIAGNOSIS — N401 Enlarged prostate with lower urinary tract symptoms: Secondary | ICD-10-CM | POA: Diagnosis not present

## 2014-06-20 DIAGNOSIS — R338 Other retention of urine: Secondary | ICD-10-CM

## 2014-06-20 LAB — URINALYSIS COMPLETE WITH MICROSCOPIC (ARMC ONLY)
BILIRUBIN URINE: NEGATIVE
GLUCOSE, UA: 150 mg/dL — AB
Ketones, ur: NEGATIVE mg/dL
Nitrite: NEGATIVE
Protein, ur: 100 mg/dL — AB
SPECIFIC GRAVITY, URINE: 1.011 (ref 1.005–1.030)
Squamous Epithelial / LPF: NONE SEEN
pH: 6 (ref 5.0–8.0)

## 2014-06-20 NOTE — ED Provider Notes (Signed)
[facility] Emergency Department Provider Note  Time seen: 7:54 AM  I have reviewed the triage vital signs and the nursing notes.   HISTORY  Chief Complaint Urinary Retention    HPI Antonio [name] is a 69 y.o. male with a past medical history of hypertension, hyperlipidemia, diabetes who presents the emergency department being unable to urinate 5 hours. According to the patient for the last several weeks he has had several Foley catheters placed for inability to urinate. He has seen urology 3 days ago and had his catheter removed. He has been able to urinate without any issues since. However around 3:00 this morning patient awoke with an urge to urinate, he went to the restroom and he was unable to produce any urine. States since he has not been able to urinate and he is having lower abdominal distention and pain. Patient denies any fever, pain with urination, or recent bloody urination. Lower abdominal pain as dull and constant approximately a 7/10.     Past Medical History  Diagnosis Date  . Hypertension   . Hyperlipidemia   . Diabetes mellitus     Type II  . Gastritis   . Esophageal stricture   . URI (upper respiratory infection)   . PSA elevation     now averaging 2's  . Internal hemorrhoids without mention of complication   . Lumbago   . Tobacco abuse   . Coronary atherosclerosis of unspecified type of vessel, native or graft   . Hx of colonic polyps   . 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 Other dysphagia 12/04/2012  . Bladder outlet obstruction 08/10/2011  . Weight loss, abnormal 04/24/2011  . ESOPHAGEAL STRICTURE 12/26/2006  . GASTRITIS 12/26/2006  . HYPERTENSION, BENIGN ESSENTIAL 11/19/2006  . PSA, INCREASED 11/19/2006  . DIABETES MELLITUS, TYPE II 06/27/2006  . HYPERLIPIDEMIA 06/27/2006  . CORONARY ARTERY DISEASE 06/27/2006  . HEMORRHOIDS, INTERNAL  06/27/2006  . LOW BACK PAIN, CHRONIC 06/27/2006  . TOBACCO ABUSE, HX OF 06/27/2006    Past Surgical History  Procedure Laterality Date  . Knee arthroscopy  10/00    Right  . Stress myoview  07/06    EF 67%  . 2d echo  06/03  . Ct of head  and sinuses  06/03    Negative  . Lp  06/03  . Abdominal ultrasound  06/03    Negative  . Ct of the chest, abdomen, pelvis  06/03    Chest negative; Abdomen, left adrenal lesion; Pelvis, enlarged prostate  . Persantine cardiolite  03/01    EF 68%  . Esophagogastroduodenoscopy  02/13/07    gastritis and duodenitis without bleed  . Microwave thermotherapy prostate  08/28/07    Rogers Blocker  . Polypectomy  11/95    Current Outpatient Rx  Name  Route  Sig  Dispense  Refill  . aspirin 325 MG tablet   Oral   Take 1 tablet (325 mg total) by mouth daily.         [name] atorvastatin (LIPITOR) 20 MG tablet      TAKE ONE TABLET BY MOUTH EVERY DAY   30 tablet   5     Office visit with labs required for additional ref ...   . ciprofloxacin (CIPRO) 500 MG tablet   Oral   Take 1 tablet (500 mg total) by mouth 2 (two) times daily. Patient  taking differently: Take 500 mg by mouth 2 (two) times daily. Started medication on 06-13-14   20 tablet   0   . glipiZIDE (GLUCOTROL) 5 MG tablet   Oral   Take 5 mg by mouth daily before breakfast.         . insulin detemir (LEVEMIR) 100 UNIT/ML injection      Inject 20 units at bedtime.   10 pen   5   . metFORMIN (GLUCOPHAGE) 500 MG tablet      TAKE TWO TABLETS BY MOUTH TWICE DAILY WITH A MEAL   120 tablet   0     NEEDS OFFICE VISIT FOR ADDITIONAL REFILLS   . metoprolol (LOPRESSOR) 50 MG tablet      Take 1/2 tablet by mouth twice daily.   30 tablet   7   . metoprolol (LOPRESSOR) 50 MG tablet   Oral   Take 25 mg by mouth 2 (two) times daily.         . ramipril (ALTACE) 10 MG capsule      TAKE ONE CAPSULE BY MOUTH EVERY DAY   30 capsule   8   . tamsulosin (FLOMAX) 0.4 MG CAPS capsule    Oral   Take 1 capsule (0.4 mg total) by mouth daily.   30 capsule   3     Allergies Review of patient's allergies indicates no known allergies.  Family History  Problem Relation Age of Onset  . Diabetes Father   . Alcohol abuse Paternal Uncle   . Heart disease Neg Hx   . Stroke Neg Hx   . Cancer Neg Hx   . Drug abuse Neg Hx   . Depression Neg Hx   . Alcohol abuse Paternal Uncle   . Alcohol abuse Paternal Uncle   . Rheum arthritis Mother   . Colon cancer Neg Hx     Social History History  Substance Use Topics  . Smoking status: Current Every Day Smoker -- 1.00 packs/day for 40 years    Types: Cigarettes  . Smokeless tobacco: Never Used  . Alcohol Use: Yes     Comment: rarely    Review of Systems Constitutional: Negative for fever. Cardiovascular: Negative for chest pain. Respiratory: Negative for shortness of breath. Gastrointestinal: Positive for lower abdominal pain and distention. Genitourinary: Negative for dysuria, positive for urinary retention. Musculoskeletal: Negative for back pain.  10-point ROS otherwise negative.  ____________________________________________   PHYSICAL EXAM:  VITAL SIGNS: ED Triage Vitals  Enc Vitals Group     BP 06/20/14 0727 201/93 mmHg     Pulse Rate 06/20/14 0727 85     Resp 06/20/14 0727 18     Temp 06/20/14 0727 97.5 F (36.4 C)     Temp Source 06/20/14 0727 Oral     SpO2 06/20/14 0727 100 %     Weight 06/20/14 0727 165 lb (74.844 kg)     Height 06/20/14 0727 '5\' 7"'$  (1.702 m)     Head Cir --      Peak Flow --      Pain Score 06/20/14 0728 8     Pain Loc --      Pain Edu? --      Excl. in [place]? --     Constitutional: Alert and oriented. Well appearing and in no distress. Cardiovascular: Normal rate, regular rhythm. No murmurs, rubs, or gallops. Respiratory: Normal respiratory effort without tachypnea nor retractions. Breath sounds are clear  Gastrointestinal: Mild suprapubic distention and tenderness to  palpation,  otherwise benign abdominal exam. Musculoskeletal: Nontender with normal range of motion in all extremities.  Neurologic:  Normal speech and language.  Skin:  Skin is warm, dry and intact.  Psychiatric: Mood and affect are normal. Speech and behavior are normal.   ____________________________________________    INITIAL IMPRESSION / ASSESSMENT AND PLAN / ED COURSE  Pertinent labs & imaging results that were available during my care of the patient were reviewed by me and considered in my medical decision making (see chart for details).  69 year old male with a recent past medical history of urinary retention presents to the emergency department with urinary retention. We will place a urinary catheter and send a urinalysis. I discussed with the patient he will follow-up with his urologist on Monday.  Urinalysis consistent with hematuria but does not appear to show infection. We'll discharge home with urology follow-up. ____________________________________________   FINAL CLINICAL IMPRESSION(S) / ED DIAGNOSES  Acute urinary retention   Harvest Dark, MD 06/20/14 1022

## 2014-06-20 NOTE — Discharge Instructions (Signed)
Acute Urinary Retention °Acute urinary retention is the temporary inability to urinate. °This is a common problem in older men. As men age their prostates become larger and block the flow of urine from the bladder. This is usually a problem that has come on gradually.  °HOME CARE INSTRUCTIONS °If you are sent home with a Foley catheter and a drainage system, you will need to discuss the best course of action with your health care provider. While the catheter is in, maintain a good intake of fluids. Keep the drainage bag emptied and lower than your catheter. This is so that contaminated urine will not flow back into your bladder, which could lead to a urinary tract infection. °There are two main types of drainage bags. One is a large bag that usually is used at night. It has a good capacity that will allow you to sleep through the night without having to empty it. The second type is called a leg bag. It has a smaller capacity, so it needs to be emptied more frequently. However, the main advantage is that it can be attached by a leg strap and can go underneath your clothing, allowing you the freedom to move about or leave your home. °Only take over-the-counter or prescription medicines for pain, discomfort, or fever as directed by your health care provider.  °SEEK MEDICAL CARE IF: °· You develop a low-grade fever. °· You experience spasms or leakage of urine with the spasms. °SEEK IMMEDIATE MEDICAL CARE IF:  °· You develop chills or fever. °· Your catheter stops draining urine. °· Your catheter falls out. °· You start to develop increased bleeding that does not respond to rest and increased fluid intake. °MAKE SURE YOU: °· Understand these instructions. °· Will watch your condition. °· Will get help right away if you are not doing well or get worse. °Document Released: 05/01/2000 Document Revised: 01/28/2013 Document Reviewed: 07/04/2012 °[employer]® Patient Information ©2015 [employer]. This information is not  intended to replace advice given to you by your health care provider. Make sure you discuss any questions you have with your health care provider. ° °

## 2014-06-20 NOTE — ED Notes (Signed)
Leg bag applied

## 2014-06-20 NOTE — ED Notes (Signed)
Had had recent catheters, recent removed 4 days ago, does not know reason for retention, unable to void since 3 am today.

## 2014-06-25 ENCOUNTER — Telehealth: Payer: Self-pay

## 2014-06-25 ENCOUNTER — Encounter (HOSPITAL_COMMUNITY): Payer: Self-pay | Admitting: Emergency Medicine

## 2014-06-25 ENCOUNTER — Other Ambulatory Visit: Payer: Self-pay

## 2014-06-25 ENCOUNTER — Telehealth: Payer: Self-pay | Admitting: Gastroenterology

## 2014-06-25 ENCOUNTER — Emergency Department (HOSPITAL_COMMUNITY): Payer: Medicare Other

## 2014-06-25 ENCOUNTER — Observation Stay (HOSPITAL_COMMUNITY)
Admission: EM | Admit: 2014-06-25 | Discharge: 2014-06-26 | Disposition: A | Discharge status: Home / Self Care | Payer: Medicare Other | Attending: Internal Medicine | Admitting: Internal Medicine

## 2014-06-25 ENCOUNTER — Encounter: Payer: Self-pay | Admitting: Family Medicine

## 2014-06-25 ENCOUNTER — Ambulatory Visit (INDEPENDENT_AMBULATORY_CARE_PROVIDER_SITE_OTHER): Payer: Medicare Other | Admitting: Family Medicine

## 2014-06-25 VITALS — BP 164/84 | HR 86 | Temp 98.4°F | Wt 166.0 lb

## 2014-06-25 DIAGNOSIS — F17210 Nicotine dependence, cigarettes, uncomplicated: Secondary | ICD-10-CM | POA: Insufficient documentation

## 2014-06-25 DIAGNOSIS — K449 Diaphragmatic hernia without obstruction or gangrene: Secondary | ICD-10-CM | POA: Diagnosis not present

## 2014-06-25 DIAGNOSIS — E119 Type 2 diabetes mellitus without complications: Secondary | ICD-10-CM | POA: Insufficient documentation

## 2014-06-25 DIAGNOSIS — R1310 Dysphagia, unspecified: Secondary | ICD-10-CM | POA: Diagnosis not present

## 2014-06-25 DIAGNOSIS — R339 Retention of urine, unspecified: Secondary | ICD-10-CM

## 2014-06-25 DIAGNOSIS — N401 Enlarged prostate with lower urinary tract symptoms: Secondary | ICD-10-CM | POA: Diagnosis not present

## 2014-06-25 DIAGNOSIS — I10 Essential (primary) hypertension: Secondary | ICD-10-CM | POA: Insufficient documentation

## 2014-06-25 DIAGNOSIS — E1165 Type 2 diabetes mellitus with hyperglycemia: Secondary | ICD-10-CM

## 2014-06-25 DIAGNOSIS — I2510 Atherosclerotic heart disease of native coronary artery without angina pectoris: Secondary | ICD-10-CM | POA: Diagnosis not present

## 2014-06-25 DIAGNOSIS — K209 Esophagitis, unspecified: Secondary | ICD-10-CM | POA: Diagnosis not present

## 2014-06-25 DIAGNOSIS — Z79899 Other long term (current) drug therapy: Secondary | ICD-10-CM | POA: Insufficient documentation

## 2014-06-25 DIAGNOSIS — Z466 Encounter for fitting and adjustment of urinary device: Secondary | ICD-10-CM | POA: Diagnosis not present

## 2014-06-25 DIAGNOSIS — R338 Other retention of urine: Secondary | ICD-10-CM | POA: Insufficient documentation

## 2014-06-25 DIAGNOSIS — R079 Chest pain, unspecified: Secondary | ICD-10-CM

## 2014-06-25 DIAGNOSIS — R6889 Other general symptoms and signs: Secondary | ICD-10-CM | POA: Diagnosis not present

## 2014-06-25 DIAGNOSIS — D72829 Elevated white blood cell count, unspecified: Secondary | ICD-10-CM | POA: Insufficient documentation

## 2014-06-25 DIAGNOSIS — R52 Pain, unspecified: Secondary | ICD-10-CM | POA: Diagnosis not present

## 2014-06-25 DIAGNOSIS — E785 Hyperlipidemia, unspecified: Secondary | ICD-10-CM | POA: Insufficient documentation

## 2014-06-25 LAB — BASIC METABOLIC PANEL
ANION GAP: 14 (ref 5–15)
BUN: 14 mg/dL (ref 6–20)
CO2: 22 mmol/L (ref 22–32)
Calcium: 9.2 mg/dL (ref 8.9–10.3)
Chloride: 101 mmol/L (ref 101–111)
Creatinine, Ser: 0.71 mg/dL (ref 0.61–1.24)
GFR calc non Af Amer: >60 mL/min (ref >60)
Glucose, Bld: 182 mg/dL — ABNORMAL HIGH (ref 65–99)
POTASSIUM: 3.8 mmol/L (ref 3.5–5.1)
Sodium: 137 mmol/L (ref 135–145)

## 2014-06-25 LAB — CBC
HEMATOCRIT: 46.2 % (ref 39.0–52.0)
Hemoglobin: 16 g/dL (ref 13.0–17.0)
MCH: 29.9 pg (ref 26.0–34.0)
MCHC: 34.6 g/dL (ref 30.0–36.0)
MCV: 86.4 fL (ref 78.0–100.0)
Platelets: 271 10*3/uL (ref 150–400)
RBC: 5.35 MIL/uL (ref 4.22–5.81)
RDW: 13.7 % (ref 11.5–15.5)
WBC: 16.9 10*3/uL — ABNORMAL HIGH (ref 4.0–10.5)

## 2014-06-25 LAB — I-STAT TROPONIN, ED: TROPONIN I, POC: 0 ng/mL (ref 0.00–0.08)

## 2014-06-25 LAB — CBG MONITORING, ED: Glucose-Capillary: 160 mg/dL — ABNORMAL HIGH (ref 65–99)

## 2014-06-25 MED ORDER — DIPHENHYDRAMINE HCL 50 MG/ML IJ SOLN
INTRAMUSCULAR | Status: AC
  Filled 2014-06-25: qty 1

## 2014-06-25 MED ORDER — METOPROLOL TARTRATE 1 MG/ML IV SOLN
5.0000 mg | Freq: Once | INTRAVENOUS | Status: AC
  Administered 2014-06-25: 5 mg via INTRAVENOUS
  Filled 2014-06-25: qty 5

## 2014-06-25 MED ORDER — SODIUM CHLORIDE 0.9 % IV SOLN
INTRAVENOUS | Status: DC
  Administered 2014-06-25: 23:00:00 via INTRAVENOUS

## 2014-06-25 MED ORDER — CLONIDINE HCL 0.1 MG/24HR TD PTWK
0.1000 mg | MEDICATED_PATCH | TRANSDERMAL | Status: DC
  Administered 2014-06-25: 0.1 mg via TRANSDERMAL
  Filled 2014-06-25: qty 1

## 2014-06-25 MED ORDER — HYDRALAZINE HCL 20 MG/ML IJ SOLN
20.0000 mg | Freq: Once | INTRAMUSCULAR | Status: AC
  Administered 2014-06-25: 20 mg via INTRAVENOUS
  Filled 2014-06-25: qty 1

## 2014-06-25 MED ORDER — NITROGLYCERIN 0.4 MG SL SUBL
0.4000 mg | SUBLINGUAL_TABLET | Freq: Once | SUBLINGUAL | Status: AC
  Administered 2014-06-25: 0.4 mg via SUBLINGUAL

## 2014-06-25 MED ORDER — PANTOPRAZOLE SODIUM 40 MG IV SOLR
40.0000 mg | Freq: Once | INTRAVENOUS | Status: AC
  Administered 2014-06-25: 40 mg via INTRAVENOUS
  Filled 2014-06-25: qty 40

## 2014-06-25 MED ORDER — GLUCAGON HCL RDNA (DIAGNOSTIC) 1 MG IJ SOLR
1.0000 mg | Freq: Once | INTRAMUSCULAR | Status: AC
  Administered 2014-06-25: 1 mg via INTRAVENOUS
  Filled 2014-06-25: qty 1

## 2014-06-25 MED ORDER — FENTANYL CITRATE (PF) 100 MCG/2ML IJ SOLN
INTRAMUSCULAR | Status: AC
  Filled 2014-06-25: qty 4

## 2014-06-25 MED ORDER — GLIPIZIDE 10 MG PO TABS
10.0000 mg | ORAL_TABLET | Freq: Every day | ORAL | Status: DC
  Administered 2014-06-25: 10 mg via ORAL
  Filled 2014-06-25: qty 1

## 2014-06-25 MED ORDER — GLIPIZIDE ER 10 MG PO TB24
10.0000 mg | ORAL_TABLET | Freq: Once | ORAL | Status: DC
  Filled 2014-06-25: qty 1

## 2014-06-25 MED ORDER — MIDAZOLAM HCL 5 MG/ML IJ SOLN
INTRAMUSCULAR | Status: AC
  Filled 2014-06-25: qty 3

## 2014-06-25 MED ORDER — METOPROLOL TARTRATE 25 MG PO TABS
25.0000 mg | ORAL_TABLET | Freq: Once | ORAL | Status: AC
  Administered 2014-06-25: 25 mg via ORAL
  Filled 2014-06-25: qty 1

## 2014-06-25 NOTE — Telephone Encounter (Signed)
Called by ER; intermittent dysphagia recently then more acute today after some pills that lasted for several hours.  They had already given glucagon and watched him for an hour and he was tolerating liquids just fine.   He was not on PPIs.  I recommended bid PPI and told him to call us in AM to schedule EGD in near future.

## 2014-06-25 NOTE — ED Provider Notes (Signed)
CSN: [id number]     Arrival date & time 06/25/14  1610 History   First MD Initiated Contact with Patient 06/25/14 1632     Chief Complaint  Patient presents with  . Dysphagia  . Chest Pain     (Consider location/radiation/quality/duration/timing/severity/associated sxs/prior Treatment) HPI At 8 AM the patient took his morning medications. He states it felt like they got stuck in his esophagus. He waited about an hour and tried to eat part of a biscuit. He reports that also felt like it stuck in his esophagus. He had scheduled urology appointments due to recent urinary retention and Foley catheter placement. He ended up vomiting while he was there. They sent him home to try to take in clear fluids to see if urine past. He reports that he tried to drink water however was not able to get it down and continued to vomit several times. He states since this morning he has not been able to get anything to go down and has regurgitated up approximately 5 times now. He states he's continued to have the ongoing sensation of something stuck just down at the lower end of his sternum. It has been constant. He however has been able to swallow saliva. He denies any other recent illness aside from urinary retention. He denies he had any earlier onset of anorexia, abdominal pain, nausea, vomiting or diarrhea. He reports a number of years ago he had to have an upper endoscopy and a stricture dilation. He does note over the past several weeks that things seem to be sticking a little bit but were passing. He denies he was having any problems with liquids until this final episode this morning. Past Medical History  Diagnosis Date  . Hypertension   . Hyperlipidemia   . Diabetes mellitus     Type II  . Gastritis   . Esophageal stricture   . URI (upper respiratory infection)   . PSA elevation     now averaging 2's  . Internal hemorrhoids without mention of complication   . Lumbago   . Tobacco abuse   . Coronary  atherosclerosis of unspecified type of vessel, native or graft   . Hx of colonic polyps   . BPH with urinary obstruction     stable on flomax ([name]   Past Surgical History  Procedure Laterality Date  . Knee arthroscopy  10/00    Right  . Stress myoview  07/06    EF 67%  . 2d echo  06/03  . Ct of head  and sinuses  06/03    Negative  . Lp  06/03  . Abdominal ultrasound  06/03    Negative  . Ct of the chest, abdomen, pelvis  06/03    Chest negative; Abdomen, left adrenal lesion; Pelvis, enlarged prostate  . Persantine cardiolite  03/01    EF 68%  . Esophagogastroduodenoscopy  02/13/07    gastritis and duodenitis without bleed  . Microwave thermotherapy prostate  08/28/07    Rogers Blocker  . Polypectomy  11/95   Family History  Problem Relation Age of Onset  . Diabetes Father   . Alcohol abuse Paternal Uncle   . Heart disease Neg Hx   . Stroke Neg Hx   . Cancer Neg Hx   . Drug abuse Neg Hx   . Depression Neg Hx   . Alcohol abuse Paternal Uncle   . Alcohol abuse Paternal Uncle   . Rheum arthritis Mother   . Colon cancer Neg Hx  History  Substance Use Topics  . Smoking status: Current Every Day Smoker -- 1.00 packs/day for 40 years    Types: Cigarettes  . Smokeless tobacco: Never Used  . Alcohol Use: 0.0 oz/week    0 Standard drinks or equivalent per week     Comment: rarely    Review of Systems  10 Systems reviewed and are negative for acute change except as noted in the HPI.   Allergies  Review of patient's allergies indicates no known allergies.  Home Medications   Prior to Admission medications   Medication Sig Start Date End Date Taking? Authorizing Provider  aspirin 81 MG tablet Take 81 mg by mouth daily.   Yes Historical Provider, MD  atorvastatin (LIPITOR) 20 MG tablet TAKE ONE TABLET BY MOUTH EVERY DAY 06/05/14  Yes Lucille Passy, MD  glipiZIDE (GLUCOTROL) 5 MG tablet Take 5 mg by mouth daily before breakfast.   Yes Historical Provider, MD  insulin  detemir (LEVEMIR) 100 UNIT/ML injection Inject 20 units at bedtime. 07/08/12  Yes Lucille Passy, MD  metFORMIN (GLUCOPHAGE) 500 MG tablet TAKE TWO TABLETS BY MOUTH TWICE DAILY WITH A MEAL 04/18/13  Yes Lucille Passy, MD  metoprolol (LOPRESSOR) 50 MG tablet Take 25 mg by mouth 2 (two) times daily.   Yes Historical Provider, MD  ramipril (ALTACE) 10 MG capsule TAKE ONE CAPSULE BY MOUTH EVERY DAY 03/11/14  Yes Lucille Passy, MD  tamsulosin (FLOMAX) 0.4 MG CAPS capsule Take 1 capsule (0.4 mg total) by mouth daily. 06/13/14  Yes Lavonia Drafts, MD  aspirin 325 MG tablet Take 1 tablet (325 mg total) by mouth daily. Patient not taking: Reported on 06/25/2014 08/08/11   Lucille Passy, MD  metoprolol (LOPRESSOR) 50 MG tablet Take 1/2 tablet by mouth twice daily. Patient not taking: Reported on 06/25/2014 05/05/14   Lucille Passy, MD   BP 111/61 mmHg  Pulse 70  Resp 16  Ht '5\' 7"'$  (1.702 m)  Wt 166 lb (75.297 kg)  BMI 25.99 kg/m2  SpO2 94% Physical Exam  Constitutional: He is oriented to person, place, and time. He appears well-developed and well-nourished.  HENT:  Head: Normocephalic and atraumatic.  Eyes: EOM are normal. Pupils are equal, round, and reactive to light.  Neck: Neck supple.  Cardiovascular: Normal rate, regular rhythm, normal heart sounds and intact distal pulses.   Pulmonary/Chest: Effort normal and breath sounds normal.  Abdominal: Soft. Bowel sounds are normal. He exhibits no distension. There is no tenderness.  Musculoskeletal: Normal range of motion. He exhibits no edema.  Neurological: He is alert and oriented to person, place, and time. He has normal strength. Coordination normal. GCS eye subscore is 4. GCS verbal subscore is 5. GCS motor subscore is 6.  Skin: Skin is warm, dry and intact.  Psychiatric: He has a normal mood and affect.    ED Course  Procedures (including critical care time) Labs Review Labs Reviewed  CBC - Abnormal; Notable for the following:    WBC 16.9 (*)    All  other components within normal limits  BASIC METABOLIC PANEL - Abnormal; Notable for the following:    Glucose, Bld 182 (*)    All other components within normal limits  CBG MONITORING, ED - Abnormal; Notable for the following:    Glucose-Capillary 160 (*)    All other components within normal limits  Randolm Idol, ED    Imaging Review Dg Chest 2 View  06/25/2014   CLINICAL DATA:  Chest pain, dysphagia  EXAM: CHEST  2 VIEW  COMPARISON:  09/12/2011  FINDINGS: Cardiomediastinal silhouette is stable. No acute infiltrate or pleural effusion. No pulmonary edema. Mild degenerative changes thoracic spine. Mild hyperinflation again noted.  IMPRESSION: No active cardiopulmonary disease.  Mild hyperinflation again noted.   Electronically Signed   By: Lahoma Crocker M.D.   On: 06/25/2014 17:20     EKG Interpretation   Date/Time:  Thursday Jun 25 2014 16:10:59 EDT Ventricular Rate:  80 PR Interval:  279 QRS Duration: 79 QT Interval:  371 QTC Calculation: 428 R Axis:   -55 Text Interpretation:  Sinus rhythm Prolonged PR interval LAD, consider  left anterior fascicular block Anterior infarct, old agree. no sig change  from old Confirmed by Johnney Killian, MD, Jeannie Done [phone]) on 06/25/2014 5:58:08 PM     Consult: Patient's case was reviewed with Dr. Oretha Caprice [facility]. At this point as the patient is tolerating liquids with no regurgitation. He advises the patient can be started on a twice a day PPI and resume his regular morning medications. He is to call their office first thing in the morning to schedule for upper endoscopy. He reports that the patient fails this trial, he can be scoped tomorrow.  Patient was trialed with PO Lopressor for elevated blood pressure and resumed regurgitation of clear fluids and water. IV Lopressor was administered have blood pressures remained elevated in the systolics of 453. At that point time hydralazine was administered and I did recontact Dr. Ardis Hughs who determined he  would go ahead and perform the endoscopy in the ED to determine if the patient continued to have obstruction. EGD showed  inflammation and irritation around the lower esophagus but no frank impaction. At this time Dr. Ardis Hughs suggests admission to the hospitalist with trialing liquids until the patient is adequately able to swallow. MDM   Final diagnoses:  Dysphagia  Essential hypertension  Urinary retention   The patient did not adequately pass trial of oral intake and has required IV blood pressure maintenance. He did have endoscopy in the emergency department and has lower esophageal inflammation and irritation. Incidentally has had problems with ongoing urinary retention and required replacement of his Foley catheter which had been removed earlier today by urology.    Charlesetta Shanks, MD 06/26/14 [phone]

## 2014-06-25 NOTE — Telephone Encounter (Signed)
This morning at 8AM pt ate biscuit and pt felt like got stuck in middle of chest and has had continuous chest pressure like pain since ate biscuit; worse upon movement. At 9:30 Am at [place] pt had catheter removed due to urinary retention and pt started to vomit; pt has vomited clear liquids x 5 today; last time vomited about 45 mins ago. Pt was seen again at [place] at 1:30 PM and BP 190/90. Pt was advised to see PCP. Pt walked in. No SOB, dizziness,blurred vision and h/a. Dr Darnell Level will see pt now.

## 2014-06-25 NOTE — ED Notes (Signed)
Patient transported to X-ray 

## 2014-06-25 NOTE — ED Notes (Signed)
Patient returned from X-ray 

## 2014-06-25 NOTE — ED Notes (Signed)
Pt states "I feel much better" since catheter placed.

## 2014-06-25 NOTE — ED Notes (Addendum)
Pt able to swallow pills then vomited spit, the pills were not found in the bag. Pt says he stills feels the pressure in his chest.

## 2014-06-25 NOTE — ED Notes (Signed)
Reports took meds this morning and felt like they didn't go down; then at 0900 attempted to eat a biscuit and felt like it got stuck. Sharp pain in center of chest; vomited up clear, soupy looking liquid. Chest pain increases with movement. Went to PCP; they gave him 1 nitro without any changes and sent him to ED. Took '81mg'$  ASA at home this morning. States has history of esphogeal stricture and stretching 7-8 years ago.

## 2014-06-25 NOTE — ED Notes (Signed)
CBG 160

## 2014-06-25 NOTE — Progress Notes (Signed)
Pre visit review using our clinic review tool, if applicable. No additional management support is needed unless otherwise documented below in the visit note. 

## 2014-06-25 NOTE — ED Notes (Signed)
This RN and Dr Dorothyann Gibbs in room to discuss treatment options with patient. Pt states that he wants to go home tonight and come back tomorrow for GI scope.

## 2014-06-25 NOTE — ED Notes (Signed)
Able to swallow water without difficulty. Able to keep water down. Still reports the feeling like something is creating pressure in his throat.

## 2014-06-25 NOTE — Telephone Encounter (Signed)
Seen today. 

## 2014-06-25 NOTE — Patient Instructions (Signed)
To ER today via ambulance.

## 2014-06-25 NOTE — Assessment & Plan Note (Signed)
Anticipate esoph spasm from food bolus this morning. Will need  EKG today given risk factors and chest pressure endorsed. See below. Will treat possible spasm with SL nitro 0.'4mg'$ . Discussed with patient, recommend ER evaluation today. Given he is nauseated/retching and unable to drive, will send via EMS. Wife with memory trouble, daughter on her way from [place] to pick her up. She will stay at our office while patient is taken to ER.  Pt agrees with plan.  EKG - NSR rate 90s, LAD, prolonged PR, Q waves anteriorly and inferiorly, no acute ST/T changes appreciated. No old to compare. Anticipate remote injury, not acute.

## 2014-06-25 NOTE — Progress Notes (Signed)
BP 164/84 mmHg  Pulse 86  Temp(Src) 98.4 F (36.9 C) (Oral)  Wt 166 lb (75.297 kg)  SpO2 96%   CC: vomiting with chest pain  Subjective:    Patient ID: ALOYSIUS [name], male    DOB: August 18, 1945, 69 y.o.   MRN: [id number]  HPI: Antonio [name] is a 69 y.o. male presenting on 06/25/2014 for Chest Pain and Emesis   Antonio [name] today as a walk in today for chest pain described as pressure alternating with sharp pain with nausea that started at 8am this morning. Felt like am meds didn't fully go down, worsened after he ate a biscuit at 9am. Since then has had pressure in chest and inability to take anything PO - only clear liquids. Movement worsens pain. Has vomited x3 today, NBNB, clear liquid . Burping constantly since this morning. No arm pain, jaw pain, dyspnea.   Known HTN, HLD, DM, gastritis with esophageal stricture, continued smoker.  Lab Results  Component Value Date   HGBA1C 7.9* 10/22/2013    Stress myoview last 2006.  Recent eval at [facility] urology today for urinary retention, had catheter removed.  Relevant past medical, surgical, family and social history reviewed and updated as indicated. Interim medical history since our last visit reviewed. Allergies and medications reviewed and updated. Current Outpatient Prescriptions on File Prior to Visit  Medication Sig  . aspirin 325 MG tablet Take 1 tablet (325 mg total) by mouth daily.  [name] atorvastatin (LIPITOR) 20 MG tablet TAKE ONE TABLET BY MOUTH EVERY DAY  . glipiZIDE (GLUCOTROL) 5 MG tablet Take 5 mg by mouth daily before breakfast.  . insulin detemir (LEVEMIR) 100 UNIT/ML injection Inject 20 units at bedtime.  . metFORMIN (GLUCOPHAGE) 500 MG tablet TAKE TWO TABLETS BY MOUTH TWICE DAILY WITH A MEAL  . metoprolol (LOPRESSOR) 50 MG tablet Take 1/2 tablet by mouth twice daily.  . metoprolol (LOPRESSOR) 50 MG tablet Take 25 mg by mouth 2 (two) times daily.  . ramipril (ALTACE) 10 MG capsule TAKE ONE CAPSULE BY MOUTH  EVERY DAY  . tamsulosin (FLOMAX) 0.4 MG CAPS capsule Take 1 capsule (0.4 mg total) by mouth daily.   No current facility-administered medications on file prior to visit.   Past Medical History  Diagnosis Date  . Hypertension   . Hyperlipidemia   . Diabetes mellitus     Type II  . Gastritis   . Esophageal stricture   . URI (upper respiratory infection)   . PSA elevation     now averaging 2's  . Internal hemorrhoids without mention of complication   . Lumbago   . Tobacco abuse   . Coronary atherosclerosis of unspecified type of vessel, native or graft   . Hx of colonic polyps   . BPH with urinary obstruction     stable on flomax ([name]    Review of Systems Per HPI unless specifically indicated above     Objective:    BP 164/84 mmHg  Pulse 86  Temp(Src) 98.4 F (36.9 C) (Oral)  Wt 166 lb (75.297 kg)  SpO2 96%  Wt Readings from Last 3 Encounters:  06/25/14 166 lb (75.297 kg)  06/20/14 165 lb (74.844 kg)  06/14/14 160 lb (72.576 kg)    Physical Exam  Constitutional: He appears well-developed and well-nourished. No distress.  Sitting leaning on arms over trash can, spitting up small amts clear liquid  HENT:  Mouth/Throat: Oropharynx is clear and moist. No oropharyngeal exudate.  Cardiovascular: Normal rate,  regular rhythm, normal heart sounds and intact distal pulses.   No murmur heard. Pulmonary/Chest: Effort normal and breath sounds normal. No respiratory distress. He has no wheezes. He has no rales.  Abdominal: Soft. Bowel sounds are normal. There is tenderness (epigastric).  Psychiatric: He has a normal mood and affect.  Nursing note and vitals reviewed.     Assessment & Plan:   Problem List Items Addressed This Visit    Pain in the chest - Primary    Anticipate esoph spasm from food bolus this morning. Will need  EKG today given risk factors and chest pressure endorsed. See below. Will treat possible spasm with SL nitro 0.'4mg'$ . Discussed with patient,  recommend ER evaluation today. Given he is nauseated/retching and unable to drive, will send via EMS. Wife with memory trouble, daughter on her way from [place] to pick her up. She will stay at our office while patient is taken to ER.  Pt agrees with plan.  EKG - NSR rate 90s, LAD, prolonged PR, Q waves anteriorly and inferiorly, no acute ST/T changes appreciated. No old to compare. Anticipate remote injury, not acute.       Relevant Orders   EKG 12-Lead (Completed)       Follow up plan: No Follow-up on file.

## 2014-06-26 ENCOUNTER — Encounter (HOSPITAL_COMMUNITY)
Admission: EM | Disposition: A | Discharge status: Home / Self Care | Payer: Self-pay | Source: Home / Self Care | Attending: Emergency Medicine

## 2014-06-26 ENCOUNTER — Encounter (HOSPITAL_COMMUNITY): Payer: Self-pay | Admitting: *Deleted

## 2014-06-26 DIAGNOSIS — E119 Type 2 diabetes mellitus without complications: Secondary | ICD-10-CM | POA: Diagnosis not present

## 2014-06-26 DIAGNOSIS — I10 Essential (primary) hypertension: Secondary | ICD-10-CM | POA: Diagnosis not present

## 2014-06-26 DIAGNOSIS — R1310 Dysphagia, unspecified: Secondary | ICD-10-CM | POA: Diagnosis not present

## 2014-06-26 DIAGNOSIS — E785 Hyperlipidemia, unspecified: Secondary | ICD-10-CM | POA: Diagnosis present

## 2014-06-26 DIAGNOSIS — E1165 Type 2 diabetes mellitus with hyperglycemia: Secondary | ICD-10-CM

## 2014-06-26 HISTORY — PX: ESOPHAGOGASTRODUODENOSCOPY: SHX5428

## 2014-06-26 LAB — GLUCOSE, CAPILLARY
Glucose-Capillary: 144 mg/dL — ABNORMAL HIGH (ref 65–99)
Glucose-Capillary: 134 mg/dL — ABNORMAL HIGH (ref 65–99)
Glucose-Capillary: 178 mg/dL — ABNORMAL HIGH (ref 65–99)

## 2014-06-26 LAB — CBC WITH DIFFERENTIAL/PLATELET
BASOS PCT: 0 % (ref 0–1)
Basophils Absolute: 0 10*3/uL (ref 0.0–0.1)
Eosinophils Absolute: 0.2 10*3/uL (ref 0.0–0.7)
Eosinophils Relative: 1 % (ref 0–5)
HCT: 43.5 % (ref 39.0–52.0)
Hemoglobin: 14.9 g/dL (ref 13.0–17.0)
LYMPHS ABS: 1.9 10*3/uL (ref 0.7–4.0)
Lymphocytes Relative: 14 % (ref 12–46)
MCH: 29.4 pg (ref 26.0–34.0)
MCHC: 34.3 g/dL (ref 30.0–36.0)
MCV: 86 fL (ref 78.0–100.0)
Monocytes Absolute: 0.9 10*3/uL (ref 0.1–1.0)
Monocytes Relative: 6 % (ref 3–12)
NEUTROS PCT: 79 % — AB (ref 43–77)
Neutro Abs: 10.8 10*3/uL — ABNORMAL HIGH (ref 1.7–7.7)
PLATELETS: 291 10*3/uL (ref 150–400)
RBC: 5.06 MIL/uL (ref 4.22–5.81)
RDW: 13.7 % (ref 11.5–15.5)
WBC: 13.7 10*3/uL — ABNORMAL HIGH (ref 4.0–10.5)

## 2014-06-26 LAB — COMPREHENSIVE METABOLIC PANEL
ALBUMIN: 2.9 g/dL — AB (ref 3.5–5.0)
ALT: 10 U/L — ABNORMAL LOW (ref 17–63)
ANION GAP: 6 (ref 5–15)
AST: 11 U/L — ABNORMAL LOW (ref 15–41)
Alkaline Phosphatase: 43 U/L (ref 38–126)
BILIRUBIN TOTAL: 0.6 mg/dL (ref 0.3–1.2)
BUN: 9 mg/dL (ref 6–20)
CHLORIDE: 106 mmol/L (ref 101–111)
CO2: 25 mmol/L (ref 22–32)
CREATININE: 0.58 mg/dL — AB (ref 0.61–1.24)
Calcium: 8.5 mg/dL — ABNORMAL LOW (ref 8.9–10.3)
GLUCOSE: 143 mg/dL — AB (ref 65–99)
Potassium: 3.3 mmol/L — ABNORMAL LOW (ref 3.5–5.1)
Sodium: 137 mmol/L (ref 135–145)
Total Protein: 5.4 g/dL — ABNORMAL LOW (ref 6.5–8.1)

## 2014-06-26 LAB — TROPONIN I: Troponin I: <0.03 ng/mL (ref <0.031)

## 2014-06-26 SURGERY — EGD (ESOPHAGOGASTRODUODENOSCOPY)
Anesthesia: Moderate Sedation

## 2014-06-26 MED ORDER — INSULIN ASPART 100 UNIT/ML ~~LOC~~ SOLN
0.0000 [IU] | Freq: Three times a day (TID) | SUBCUTANEOUS | Status: DC
  Administered 2014-06-26: 1 [IU] via SUBCUTANEOUS

## 2014-06-26 MED ORDER — PANTOPRAZOLE SODIUM 40 MG PO TBEC: 40.0000 mg | DELAYED_RELEASE_TABLET | Freq: Two times a day (BID) | ORAL | Status: DC

## 2014-06-26 MED ORDER — ACETAMINOPHEN 650 MG RE SUPP
650.0000 mg | Freq: Four times a day (QID) | RECTAL | Status: DC | PRN
Start: 2014-06-26 — End: 2014-06-26

## 2014-06-26 MED ORDER — ATORVASTATIN CALCIUM 20 MG PO TABS
20.0000 mg | ORAL_TABLET | Freq: Every day | ORAL | Status: DC
  Filled 2014-06-26: qty 1

## 2014-06-26 MED ORDER — METOPROLOL TARTRATE 25 MG PO TABS
25.0000 mg | ORAL_TABLET | Freq: Two times a day (BID) | ORAL | Status: DC
  Administered 2014-06-26 (×2): 25 mg via ORAL
  Filled 2014-06-26 (×3): qty 1

## 2014-06-26 MED ORDER — CETYLPYRIDINIUM CHLORIDE 0.05 % MT LIQD: 7.0000 mL | Freq: Two times a day (BID) | OROMUCOSAL | Status: DC

## 2014-06-26 MED ORDER — SODIUM CHLORIDE 0.9 % IV SOLN
INTRAVENOUS | Status: DC
  Administered 2014-06-26: 03:00:00 via INTRAVENOUS

## 2014-06-26 MED ORDER — BUTAMBEN-TETRACAINE-BENZOCAINE 2-2-14 % EX AERO
INHALATION_SPRAY | CUTANEOUS | Status: DC | PRN
  Administered 2014-06-26: 2 via TOPICAL

## 2014-06-26 MED ORDER — PANTOPRAZOLE SODIUM 40 MG IV SOLR
40.0000 mg | Freq: Two times a day (BID) | INTRAVENOUS | Status: DC
  Administered 2014-06-26 (×2): 40 mg via INTRAVENOUS
  Filled 2014-06-26 (×3): qty 40

## 2014-06-26 MED ORDER — HYDRALAZINE HCL 20 MG/ML IJ SOLN
10.0000 mg | INTRAMUSCULAR | Status: DC | PRN
  Filled 2014-06-26: qty 1

## 2014-06-26 MED ORDER — FENTANYL CITRATE (PF) 100 MCG/2ML IJ SOLN
INTRAMUSCULAR | Status: DC | PRN
  Administered 2014-06-26 (×3): 25 ug via INTRAVENOUS

## 2014-06-26 MED ORDER — ONDANSETRON HCL 4 MG PO TABS: 4.0000 mg | ORAL_TABLET | Freq: Four times a day (QID) | ORAL | Status: DC | PRN

## 2014-06-26 MED ORDER — INSULIN DETEMIR 100 UNIT/ML ~~LOC~~ SOLN
20.0000 [IU] | Freq: Every day | SUBCUTANEOUS | Status: DC
  Filled 2014-06-26: qty 0.2

## 2014-06-26 MED ORDER — SODIUM CHLORIDE 0.9 % IV SOLN: INTRAVENOUS | Status: DC

## 2014-06-26 MED ORDER — TAMSULOSIN HCL 0.4 MG PO CAPS
0.4000 mg | ORAL_CAPSULE | Freq: Every day | ORAL | Status: DC
  Administered 2014-06-26: 0.4 mg via ORAL
  Filled 2014-06-26: qty 1

## 2014-06-26 MED ORDER — GLIPIZIDE 5 MG PO TABS
5.0000 mg | ORAL_TABLET | Freq: Every day | ORAL | Status: DC
  Administered 2014-06-26: 5 mg via ORAL
  Filled 2014-06-26 (×2): qty 1

## 2014-06-26 MED ORDER — CHLORHEXIDINE GLUCONATE 0.12 % MT SOLN
15.0000 mL | Freq: Two times a day (BID) | OROMUCOSAL | Status: DC
  Filled 2014-06-26 (×3): qty 15

## 2014-06-26 MED ORDER — ASPIRIN EC 81 MG PO TBEC
81.0000 mg | DELAYED_RELEASE_TABLET | Freq: Every day | ORAL | Status: DC
  Administered 2014-06-26: 81 mg via ORAL
  Filled 2014-06-26: qty 1

## 2014-06-26 MED ORDER — MIDAZOLAM HCL 10 MG/2ML IJ SOLN
INTRAMUSCULAR | Status: DC | PRN
  Administered 2014-06-26 (×3): 2 mg via INTRAVENOUS

## 2014-06-26 MED ORDER — RAMIPRIL 10 MG PO CAPS
10.0000 mg | ORAL_CAPSULE | Freq: Every day | ORAL | Status: DC
Start: 2014-06-26 — End: 2014-06-26
  Administered 2014-06-26: 10 mg via ORAL
  Filled 2014-06-26: qty 1

## 2014-06-26 MED ORDER — ONDANSETRON HCL 4 MG/2ML IJ SOLN: 4.0000 mg | Freq: Four times a day (QID) | INTRAMUSCULAR | Status: DC | PRN

## 2014-06-26 MED ORDER — ACETAMINOPHEN 325 MG PO TABS: 650.0000 mg | ORAL_TABLET | Freq: Four times a day (QID) | ORAL | Status: DC | PRN

## 2014-06-26 MED ORDER — DIPHENHYDRAMINE HCL 50 MG/ML IJ SOLN
12.5000 mg | Freq: Once | INTRAMUSCULAR | Status: AC
  Administered 2014-06-26: 12.5 mg via INTRAVENOUS
  Filled 2014-06-26: qty 1

## 2014-06-26 NOTE — Op Note (Signed)
PATIENT: Antonio [name], Antonio [name]  MR#: #[id number]  BIRTHDATE: 1945/07/25 , 31  yrs. old GENDER: male  ENDOSCOPIST: Milus Banister MD  PROCEDURE DATE:  06/26/2014  PROCEDURE:  EGD, diagnostic    ASA CLASS:     Class III  INDICATIONS:  dysphagia, ? food impaction.   MEDICATIONS: Fentanyl 75 mcg IV and Versed 6 mg IV     TOPICAL ANESTHETIC: Cetacaine Spray   DESCRIPTION OF PROCEDURE: After the risks benefits and alternatives of the procedure were thoroughly explained, informed consent was obtained.  The PENTAX GASTOROSCOPE [id number] endoscope was introduced through the mouth and advanced to the second portion of the duodenum , Without limitations.  The instrument was slowly withdrawn as the mucosa was fully examined. images    The GE junction was edematous and red but there was no obvious reflux related inflammation, esophagitis.  It was also narrowed, seeminly due to edema.  I was able to advance the adult gastroscope into the stomach without significant resistence.  There was a small hiatal hernia.  The UGI tract was otherwise normal.  There was no food impaction.  Retroflexed views revealed no abnormalities.     The scope was then withdrawn from the patient and the procedure completed.  COMPLICATIONS: There were no immediate complications.      ENDOSCOPIC IMPRESSION: The GE junction was edematous and red but there was no obvious reflux related inflammation, esophagitis.  It was also narrowed, seeminly due to edema however I was able to advance the adult gastroscope into the stomach without significant resistance.  There was a small hiatal hernia.  The UGI tract was otherwise normal.  There was no food impaction.    RECOMMENDATIONS: His symptoms started when taking his usual handful of pills in the AM.  I'm suspicious for pill related trauma, edema at the GE junction.  For several weeks he's had intermittent dysphagia with only rare GERD.  Perhaps underlying motility disorder? It is 1am now, planning  to have him admitted for observation.  If his vomiting, swallowing improves he will be OK for discharge.  Should start PPI twice daily and continue until he is seen in our office in follow up.         esign revised    CC: Rene Paci, MD

## 2014-06-26 NOTE — Care Management Note (Signed)
Case Management Note  Patient Details  Name: KAYNEN MINNER MRN: [id number] Date of Birth: 02/16/1945  Subjective/Objective:       Patient lives with spouse, for dc today, no needs.             Action/Plan:   Expected Discharge Date:                  Expected Discharge Plan:  Home/Self Care  In-House Referral:     Discharge planning Services  CM Consult  Post Acute Care Choice:    Choice offered to:     DME Arranged:    DME Agency:     HH Arranged:    [place] Agency:     Status of Service:  Completed, signed off  Medicare Important Message Given:  No Date Medicare IM Given:    Medicare IM give by:    Date Additional Medicare IM Given:    Additional Medicare Important Message give by:     If discussed at [place] of Stay Meetings, dates discussed:    Additional Comments:  Zenon Mayo, RN 06/26/2014, 12:57 PM

## 2014-06-26 NOTE — H&P (View-Only) (Signed)
BP 164/84 mmHg  Pulse 86  Temp(Src) 98.4 F (36.9 C) (Oral)  Wt 166 lb (75.297 kg)  SpO2 96%   CC: vomiting with chest pain  Subjective:    Patient ID: Antonio [name], male    DOB: 1945-07-11, 69 y.o.   MRN: [id number]  HPI: Antonio [name] is a 69 y.o. male presenting on 06/25/2014 for Chest Pain and Emesis   Antonio [name] presents today as a walk in today for chest pain described as pressure alternating with sharp pain with nausea that started at 8am this morning. Felt like am meds didn't fully go down, worsened after he ate a biscuit at 9am. Since then has had pressure in chest and inability to take anything PO - only clear liquids. Movement worsens pain. Has vomited x3 today, NBNB, clear liquid . Burping constantly since this morning. No arm pain, jaw pain, dyspnea.   Known HTN, HLD, DM, gastritis with esophageal stricture, continued smoker.  Lab Results  Component Value Date   HGBA1C 7.9* 10/22/2013    Stress myoview last 2006.  Recent eval at [facility] urology today for urinary retention, had catheter removed.  Relevant past medical, surgical, family and social history reviewed and updated as indicated. Interim medical history since our last visit reviewed. Allergies and medications reviewed and updated. Current Outpatient Prescriptions on File Prior to Visit  Medication Sig  . aspirin 325 MG tablet Take 1 tablet (325 mg total) by mouth daily.  [name] atorvastatin (LIPITOR) 20 MG tablet TAKE ONE TABLET BY MOUTH EVERY DAY  . glipiZIDE (GLUCOTROL) 5 MG tablet Take 5 mg by mouth daily before breakfast.  . insulin detemir (LEVEMIR) 100 UNIT/ML injection Inject 20 units at bedtime.  . metFORMIN (GLUCOPHAGE) 500 MG tablet TAKE TWO TABLETS BY MOUTH TWICE DAILY WITH A MEAL  . metoprolol (LOPRESSOR) 50 MG tablet Take 1/2 tablet by mouth twice daily.  . metoprolol (LOPRESSOR) 50 MG tablet Take 25 mg by mouth 2 (two) times daily.  . ramipril (ALTACE) 10 MG capsule TAKE ONE CAPSULE BY MOUTH  EVERY DAY  . tamsulosin (FLOMAX) 0.4 MG CAPS capsule Take 1 capsule (0.4 mg total) by mouth daily.   No current facility-administered medications on file prior to visit.   Past Medical History  Diagnosis Date  . Hypertension   . Hyperlipidemia   . Diabetes mellitus     Type II  . Gastritis   . Esophageal stricture   . URI (upper respiratory infection)   . PSA elevation     now averaging 2's  . Internal hemorrhoids without mention of complication   . Lumbago   . Tobacco abuse   . Coronary atherosclerosis of unspecified type of vessel, native or graft   . Hx of colonic polyps   . BPH with urinary obstruction     stable on flomax ([name]    Review of Systems Per HPI unless specifically indicated above     Objective:    BP 164/84 mmHg  Pulse 86  Temp(Src) 98.4 F (36.9 C) (Oral)  Wt 166 lb (75.297 kg)  SpO2 96%  Wt Readings from Last 3 Encounters:  06/25/14 166 lb (75.297 kg)  06/20/14 165 lb (74.844 kg)  06/14/14 160 lb (72.576 kg)    Physical Exam  Constitutional: He appears well-developed and well-nourished. No distress.  Sitting leaning on arms over trash can, spitting up small amts clear liquid  HENT:  Mouth/Throat: Oropharynx is clear and moist. No oropharyngeal exudate.  Cardiovascular: Normal rate,  regular rhythm, normal heart sounds and intact distal pulses.   No murmur heard. Pulmonary/Chest: Effort normal and breath sounds normal. No respiratory distress. He has no wheezes. He has no rales.  Abdominal: Soft. Bowel sounds are normal. There is tenderness (epigastric).  Psychiatric: He has a normal mood and affect.  Nursing note and vitals reviewed.     Assessment & Plan:   Problem List Items Addressed This Visit    Pain in the chest - Primary    Anticipate esoph spasm from food bolus this morning. Will need  EKG today given risk factors and chest pressure endorsed. See below. Will treat possible spasm with SL nitro 0.'4mg'$ . Discussed with patient,  recommend ER evaluation today. Given he is nauseated/retching and unable to drive, will send via EMS. Wife with memory trouble, daughter on her way from [place] to pick her up. She will stay at our office while patient is taken to ER.  Pt agrees with plan.  EKG - NSR rate 90s, LAD, prolonged PR, Q waves anteriorly and inferiorly, no acute ST/T changes appreciated. No old to compare. Anticipate remote injury, not acute.       Relevant Orders   EKG 12-Lead (Completed)       Follow up plan: No Follow-up on file.

## 2014-06-26 NOTE — ED Notes (Signed)
Transporting patient to new room assignment. 

## 2014-06-26 NOTE — Interval H&P Note (Signed)
History and Physical Interval Note:  06/26/2014 12:24 AM  Antonio [name]  has presented today for surgery, with the diagnosis of food impaction  The various methods of treatment have been discussed with the patient and family. After consideration of risks, benefits and other options for treatment, the patient has consented to  Procedure(s): ESOPHAGOGASTRODUODENOSCOPY (EGD) (N/A) as a surgical intervention .  The patient's history has been reviewed, patient examined, no change in status, stable for surgery.  I have reviewed the patient's chart and labs.  Questions were answered to the patient's satisfaction.     Milus Banister

## 2014-06-26 NOTE — Telephone Encounter (Signed)
Was called by ER at 44.  He was not able to take his pills without vomiting so I went in and proceeded with EGD.  I'll forward that report.

## 2014-06-26 NOTE — Discharge Summary (Signed)
Physician Discharge Summary  Antonio [name] KGU:[id number] DOB: [birth year] DOA: 06/25/2014  PCP: Arnette Norris, MD  Admit date: 06/25/2014 Discharge date: 06/26/2014  Time spent: 45 minutes  Recommendations for Outpatient Follow-up:  1. Lori Hvozdovic, [facility] on 6/13 at 9am 2. FU with Urology at [place] in 1 week for Voiding trial and further workup for urinary retention  Discharge Diagnoses:  Principal Problem:   Dysphagia Active Problems:   Hypertension, uncontrolled   Diabetes mellitus type 2, controlled   Hyperlipidemia   Essential hypertension   Discharge Condition: stable  Diet recommendation: soft diet advance as tolerated  Filed Weights   06/25/14 1615 06/26/14 0242  Weight: 75.297 kg (166 lb) 73.4 kg (161 lb 13.1 oz)    History of present illness:  Chief Complaint: Nausea vomiting and difficulty swallowing. HPI: Antonio [name] is a 69 y.o. male with history of hypertension, hyperlipidemia, diabetes mellitus type 2 who had been experiencing urinary retention and had a Foley catheter was referred to the ER after patient was having nausea and vomiting. Patient stated that he woke up and had his regular medications after which he felt that he had something stuck in his chest. He had appointment with his urologist and his Foley catheter was discontinued. When he reached home he started throwing up multiple times and his primary care physician referred him to the ER. In the ER gastroenterologist Dr. Ardis Hughs was consulted and patient had EGD done which only shows some erythema at the distal GE junction. At this time gastroenterologist has advised admitted for observation and to see if patient is able to tolerate food   Hospital Course:  He was only admitted overnight for observation  DYsphagia/mild esophagitis seen on EGD: resolved, EGD as noted above may have a motility disorder and will need further workup if this recurs. At this time, his diet was gradually  advanced and he tolerated this well. -advised to take PPI BID till FU with Gi, appt made for 6/13  Urinary retention -followed by Urologist in [place] and has seen [facility]. -discharged home with Foley will need to get back to see his Urologist in 1 week and patient and family advised about the need to have Urological workup to determine the etiology of this.   Procedures: ENDOSCOPIC IMPRESSION: The GE junction was edematous and red but there was no obvious reflux related inflammation, esophagitis. It was also narrowed, seeminly due to edema however able to advance the adult gastroscope into the stomach without significant resistance. There was a small hiatal hernia. The UGI tract was otherwise normal. There was no food impaction  Consultations:  GI  Discharge Exam: Filed Vitals:   06/26/14 1253  BP: 134/65  Pulse:   Temp:   Resp:     General: AAOx3 Cardiovascular: S1S2/RRR Respiratory: CTAB  Discharge Instructions   Discharge Instructions    Diet - low sodium heart healthy    Complete by:  As directed      Diet Carb Modified    Complete by:  As directed      Increase activity slowly    Complete by:  As directed           Discharge Medication List as of 06/26/2014 11:57 AM    START taking these medications   Details  pantoprazole (PROTONIX) 40 MG tablet Take 1 tablet (40 mg total) by mouth 2 (two) times daily., Starting 06/26/2014, Until Discontinued, Print      CONTINUE these medications which have  NOT CHANGED   Details  aspirin 81 MG tablet Take 81 mg by mouth daily., Until Discontinued, Historical Med    atorvastatin (LIPITOR) 20 MG tablet TAKE ONE TABLET BY MOUTH EVERY DAY, Normal    glipiZIDE (GLUCOTROL) 5 MG tablet Take 5 mg by mouth daily before breakfast., Until Discontinued, Historical Med    insulin detemir (LEVEMIR) 100 UNIT/ML injection Inject 20 units at bedtime., Normal    metFORMIN (GLUCOPHAGE) 500 MG tablet TAKE TWO TABLETS BY  MOUTH TWICE DAILY WITH A MEAL, Normal    metoprolol (LOPRESSOR) 50 MG tablet Take 25 mg by mouth 2 (two) times daily., Until Discontinued, Historical Med    ramipril (ALTACE) 10 MG capsule TAKE ONE CAPSULE BY MOUTH EVERY DAY, Normal    tamsulosin (FLOMAX) 0.4 MG CAPS capsule Take 1 capsule (0.4 mg total) by mouth daily., Starting 06/13/2014, Until Discontinued, Print       No Known Allergies Follow-up Information    Follow up with Hvozdovic, Vita Barley, PA-C On 07/20/2014.   Specialty:  Physician Assistant   Why:  appt at 9:15--be there at 9:00 [facility])   Contact information:   [place] [address] [phone]       Follow up with Urology. Schedule an appointment as soon as possible for a visit in 1 week.   Why:  for Urinary retention/Foley       The results of significant diagnostics from this hospitalization (including imaging, microbiology, ancillary and laboratory) are listed below for reference.    Significant Diagnostic Studies: Dg Chest 2 View  06/25/2014   CLINICAL DATA:  Chest pain, dysphagia  EXAM: CHEST  2 VIEW  COMPARISON:  09/12/2011  FINDINGS: Cardiomediastinal silhouette is stable. No acute infiltrate or pleural effusion. No pulmonary edema. Mild degenerative changes thoracic spine. Mild hyperinflation again noted.  IMPRESSION: No active cardiopulmonary disease.  Mild hyperinflation again noted.   Electronically Signed   By: Lahoma Crocker M.D.   On: 06/25/2014 17:20    Microbiology: No results found for this or any previous visit (from the past 240 hour(s)).   Labs: Basic Metabolic Panel:  Recent Labs Lab 06/25/14 1631 06/26/14 0435  NA 137 137  K 3.8 3.3*  CL 101 106  CO2 22 25  GLUCOSE 182* 143*  BUN 14 9  CREATININE 0.71 0.58*  CALCIUM 9.2 8.5*   Liver Function Tests:  Recent Labs Lab 06/26/14 0435  AST 11*  ALT 10*  ALKPHOS 43  BILITOT 0.6  PROT 5.4*  ALBUMIN 2.9*   No results for input(s): LIPASE, AMYLASE in the last  168 hours. No results for input(s): AMMONIA in the last 168 hours. CBC:  Recent Labs Lab 06/25/14 1631 06/26/14 0435  WBC 16.9* 13.7*  NEUTROABS  --  10.8*  HGB 16.0 14.9  HCT 46.2 43.5  MCV 86.4 86.0  PLT 271 291   Cardiac Enzymes:  Recent Labs Lab 06/26/14 0435  TROPONINI <0.03   BNP: BNP (last 3 results) No results for input(s): BNP in the last 8760 hours.  ProBNP (last 3 results) No results for input(s): PROBNP in the last 8760 hours.  CBG:  Recent Labs Lab 06/25/14 2251 06/26/14 0248 06/26/14 0753 06/26/14 1153  GLUCAP 160* 144* 134* 178*       Signed:  Kelicia Youtz  [facility] 06/26/2014, 4:22 PM

## 2014-06-26 NOTE — Progress Notes (Signed)
   06/26/14 1122  Vitals  BP (!) 184/78 mmHg  MAP (mmHg) 106  BP Method Automatic  ECG Heart Rate 74  Dr Broadus Shalamar aware, will watch patient for one hour after hydralazine and if bp better will dc.

## 2014-06-26 NOTE — ED Notes (Signed)
Clonidine patche removed per Dr. Renetta Chalk.

## 2014-06-26 NOTE — H&P (Signed)
[facility] History and Physical  Antonio [name] YQI:[id number] DOB: Jul 22, 1945 DOA: 06/25/2014  Referring physician: Dr. Vallery Ridge. PCP: Arnette Norris, MD  Specialists: Patient follows urologist in [place].  Chief Complaint: Nausea vomiting and difficulty swallowing.  HPI: Antonio [name] is a 69 y.o. male with history of hypertension, hyperlipidemia, diabetes mellitus type 2 who has been experiencing urinary retention and is on Foley catheter was referred to the ER after patient was having nausea and vomiting. Patient states history morning he woke up and had his regular medications after which he felt that he has something stuck feeling in the chest. He had appointment with his urologist and over that his Foley catheter was discontinued and patient was advised to have a lot of fluid. When he reached home he started throwing up multiple times and his primary care physician referred him to the ER. In the ER gastroenterologist Dr. Ardis Hughs was consulted and patient had EGD done which only shows some erythema at the distal GE junction. At this time gastroenterologist has advised admitted for observation and to see if patient is able to tolerate food and keep patient on PPIs. On my exam patient denies any chest pain or shortness of breath. In the ER patient was placed on Foley again. Patient's blood pressure was found to be elevated and was given when necessary IV hydralazine initially also clonidine.  Review of Systems: As presented in the history of presenting illness, rest negative.  Past Medical History  Diagnosis Date  . Hypertension   . Hyperlipidemia   . Diabetes mellitus     Type II  . Gastritis   . Esophageal stricture   . URI (upper respiratory infection)   . PSA elevation     now averaging 2's  . Internal hemorrhoids without mention of complication   . Lumbago   . Tobacco abuse   . Coronary atherosclerosis of unspecified type of vessel, native or graft   . Hx of colonic polyps    . BPH with urinary obstruction     stable on flomax ([name]   Past Surgical History  Procedure Laterality Date  . Knee arthroscopy  10/00    Right  . Stress myoview  07/06    EF 67%  . 2d echo  06/03  . Ct of head  and sinuses  06/03    Negative  . Lp  06/03  . Abdominal ultrasound  06/03    Negative  . Ct of the chest, abdomen, pelvis  06/03    Chest negative; Abdomen, left adrenal lesion; Pelvis, enlarged prostate  . Persantine cardiolite  03/01    EF 68%  . Esophagogastroduodenoscopy  02/13/07    gastritis and duodenitis without bleed  . Microwave thermotherapy prostate  08/28/07    Rogers Blocker  . Polypectomy  11/95   Social History:  reports that he has been smoking Cigarettes.  He has a 40 pack-year smoking history. He has never used smokeless tobacco. He reports that he drinks alcohol. He reports that he does not use illicit drugs. Where does patient live home. Can patient participate in ADLs? Yes.  No Known Allergies  Family History:  Family History  Problem Relation Age of Onset  . Diabetes Father   . Alcohol abuse Paternal Uncle   . Heart disease Neg Hx   . Stroke Neg Hx   . Cancer Neg Hx   . Drug abuse Neg Hx   . Depression Neg Hx   . Alcohol abuse Paternal Uncle   .  Alcohol abuse Paternal Uncle   . Rheum arthritis Mother   . Colon cancer Neg Hx       Prior to Admission medications   Medication Sig Start Date End Date Taking? Authorizing Provider  aspirin 81 MG tablet Take 81 mg by mouth daily.   Yes Historical Provider, MD  atorvastatin (LIPITOR) 20 MG tablet TAKE ONE TABLET BY MOUTH EVERY DAY 06/05/14  Yes Lucille Passy, MD  glipiZIDE (GLUCOTROL) 5 MG tablet Take 5 mg by mouth daily before breakfast.   Yes Historical Provider, MD  insulin detemir (LEVEMIR) 100 UNIT/ML injection Inject 20 units at bedtime. 07/08/12  Yes Lucille Passy, MD  metFORMIN (GLUCOPHAGE) 500 MG tablet TAKE TWO TABLETS BY MOUTH TWICE DAILY WITH A MEAL 04/18/13  Yes Lucille Passy, MD   metoprolol (LOPRESSOR) 50 MG tablet Take 25 mg by mouth 2 (two) times daily.   Yes Historical Provider, MD  ramipril (ALTACE) 10 MG capsule TAKE ONE CAPSULE BY MOUTH EVERY DAY 03/11/14  Yes Lucille Passy, MD  tamsulosin (FLOMAX) 0.4 MG CAPS capsule Take 1 capsule (0.4 mg total) by mouth daily. 06/13/14  Yes Lavonia Drafts, MD  aspirin 325 MG tablet Take 1 tablet (325 mg total) by mouth daily. Patient not taking: Reported on 06/25/2014 08/08/11   Lucille Passy, MD  metoprolol (LOPRESSOR) 50 MG tablet Take 1/2 tablet by mouth twice daily. Patient not taking: Reported on 06/25/2014 05/05/14   Lucille Passy, MD    Physical Exam: Filed Vitals:   06/26/14 0100 06/26/14 0110 06/26/14 0130 06/26/14 0227  BP: 108/60 111/61 149/64 125/57  Pulse: 70 70 74 76  Resp: '17 16 25 20  '$ Height:      Weight:      SpO2: 94% 94% 95% 94%     General:  Well-developed and nourished.  Eyes: Anicteric no pallor.  ENT: No discharge from ears eyes nose and mouth.  Neck: No mass felt.  Cardiovascular: S1 and S2 heard.  Respiratory: No rhonchi or crepitations.  Abdomen: Soft nontender bowel sounds present.  Skin: No rash.  Musculoskeletal: No edema.  Psychiatric: Appears normal.  Neurologic: Alert awake oriented to time place and person. Moves all extremities.  Labs on Admission:  Basic Metabolic Panel:  Recent Labs Lab 06/25/14 1631  NA 137  K 3.8  CL 101  CO2 22  GLUCOSE 182*  BUN 14  CREATININE 0.71  CALCIUM 9.2   Liver Function Tests: No results for input(s): AST, ALT, ALKPHOS, BILITOT, PROT, ALBUMIN in the last 168 hours. No results for input(s): LIPASE, AMYLASE in the last 168 hours. No results for input(s): AMMONIA in the last 168 hours. CBC:  Recent Labs Lab 06/25/14 1631  WBC 16.9*  HGB 16.0  HCT 46.2  MCV 86.4  PLT 271   Cardiac Enzymes: No results for input(s): CKTOTAL, CKMB, CKMBINDEX, TROPONINI in the last 168 hours.  BNP (last 3 results) No results for input(s): BNP in  the last 8760 hours.  ProBNP (last 3 results) No results for input(s): PROBNP in the last 8760 hours.  CBG:  Recent Labs Lab 06/25/14 2251  GLUCAP 160*    Radiological Exams on Admission: Dg Chest 2 View  06/25/2014   CLINICAL DATA:  Chest pain, dysphagia  EXAM: CHEST  2 VIEW  COMPARISON:  09/12/2011  FINDINGS: Cardiomediastinal silhouette is stable. No acute infiltrate or pleural effusion. No pulmonary edema. Mild degenerative changes thoracic spine. Mild hyperinflation again noted.  IMPRESSION: No active cardiopulmonary  disease.  Mild hyperinflation again noted.   Electronically Signed   By: Lahoma Crocker M.D.   On: 06/25/2014 17:20    EKG: Independently reviewed. Normal sinus rhythm.  Assessment/Plan Principal Problem:   Dysphagia Active Problems:   Hypertension, uncontrolled   Diabetes mellitus type 2, controlled   Hyperlipidemia   1. Dysphagia with nausea and vomiting - appreciate GI consult. At this time patient has been placed on clear liquid diet and PPI. If patient can tolerate oral and there is no further vomiting then patient can be discharged home to follow-up with GI. 2. Hypertension uncontrolled - I have placed patient on when necessary IV hydralazine and continuing his home medication. Closely follow blood pressure trends. 3. Diabetes mellitus type 2 - patient is on insulin and oral medication which will be continued with sliding scale coverage. 4. Hyperlipidemia - continue present medications.  5. Leukocytosis probably reactionary patient is afebrile chest x-ray is unremarkable check UA. 6. Urinary retention on Foley catheter will need follow-up with urologist.   DVT ProphylaxisSCDs.  Code Status: Full code.  Family Communication: Family at the bedside.  Disposition Plan: Admit for observation.    Iwalani Templeton N. [facility] Pager [phone].  If 7PM-7AM, please contact night-coverage [email or web address] Password [email or web address] 06/26/2014, 2:30  AM

## 2014-06-26 NOTE — Progress Notes (Signed)
[facility] Progress Note  Subjective:   S/P EGD early this mornig. No CP. Tol liquids. Hungry. Wants to eat and go home. Voices concerns of urinary retention requiring frequent cath recently. Had foley placed last pm.   Objective:  Vital signs in last 24 hours: Temp:  [98.4 F (36.9 C)-98.7 F (37.1 C)] 98.7 F (37.1 C) (05/20 0800) Pulse Rate:  [69-97] 80 (05/20 0800) Resp:  [10-25] 18 (05/20 0800) BP: (108-206)/(57-95) 157/81 mmHg (05/20 0800) SpO2:  [93 %-98 %] 95 % (05/20 0800) Weight:  [161 lb 13.1 oz (73.4 kg)-166 lb (75.297 kg)] 161 lb 13.1 oz (73.4 kg) (05/20 0242)   General:   Alert,  Well-developed,    in NAD Heart:  Regular rate and rhythm; no murmurs Pulm;lungs clear Abdomen:  Soft, nontender and nondistended. Normal bowel sounds, without guarding, and without rebound.   Extremities:  Without edema. Neurologic:Alert and  oriented x4;  grossly normal neurologically. Psych: Alert and cooperative. Normal mood and affect.  Intake/Output from previous day: 05/19 0701 - 05/20 0700 In: -  Out: 2690 [Urine:2690] Intake/Output this shift: Total I/O In: 360 [P.O.:360] Out: 250 [Urine:250]  Lab Results:  Recent Labs  06/25/14 1631 06/26/14 0435  WBC 16.9* 13.7*  HGB 16.0 14.9  HCT 46.2 43.5  PLT 271 291   BMET  Recent Labs  06/25/14 1631 06/26/14 0435  NA 137 137  K 3.8 3.3*  CL 101 106  CO2 22 25  GLUCOSE 182* 143*  BUN 14 9  CREATININE 0.71 0.58*  CALCIUM 9.2 8.5*   LFT  Recent Labs  06/26/14 0435  PROT 5.4*  ALBUMIN 2.9*  AST 11*  ALT 10*  ALKPHOS 43  BILITOT 0.6    Dg Chest 2 View  06/25/2014   CLINICAL DATA:  Chest pain, dysphagia  EXAM: CHEST  2 VIEW  COMPARISON:  09/12/2011  FINDINGS: Cardiomediastinal silhouette is stable. No acute infiltrate or pleural effusion. No pulmonary edema. Mild degenerative changes thoracic spine. Mild hyperinflation again noted.  IMPRESSION: No active cardiopulmonary disease.  Mild  hyperinflation again noted.   Electronically Signed   By: Lahoma Crocker M.D.   On: 06/25/2014 17:20    Endoscopies:  EGD 06/26/14: ENDOSCOPIC IMPRESSION: The GE junction was edematous and red but there was no obvious reflux related inflammation, esophagitis. It was also narrowed, seeminly due to edema however I was able to advance the adult gastroscope into the stomach without significant resistance. There was a small hiatal hernia. The UGI tract was otherwise normal. There was no food impaction.   RECOMMENDATIONS: His symptoms started when taking his usual handful of pills in the AM. I'm suspicious for pill related trauma, edema at the GE junction. For several weeks he's had intermittent dysphagia with only rare GERD. Perhaps underlying motility disorder? It is 1am now, planning to have him admitted for observation. If his vomiting, swallowing improves he will be OK for discharge. Should start PPI twice daily and continue until he is seen in our office in follow up.     ASSESSMENT/PLAN:   69 yo male admitted via ER with food impaction, who had EGD early this am--see above impression. Advance tosoft diet. From GI standpoint, if he tol soft diet can go home on bid PPI. Has f/u scheduled in GI office.        Hvozdovic, Deloris Ping 06/26/2014, Pager [phone]  GI ATTENDING  Endoscopy report reviewed. Interval history and data reviewed. Agree with progress note as  outlined. Stable for discharge on PPI and GI follow-up as arranged.  Docia Chuck. Geri Seminole., M.D. [facility] Division of Gastroenterology

## 2014-06-26 NOTE — Progress Notes (Signed)
DMITRIY GAIR [id number] Code Status: Full    Admission Data: 06/26/2014 2:45AM Attending Provider:  Gean Birchwood MD IPP:GFQMK Deborra Medina, MD Consults/ Treatment Team:    SON BARKAN is a 69 y.o. male patient admitted from ED awake, alert - oriented  X 3 - no acute distress noted.  VSS - Blood pressure 179/70, pulse 77, temperature 98.4 F (36.9 C), temperature source Oral, resp. rate 18, height '5\' 7"'$  (1.702 m), weight 73.4 kg (161 lb 13.1 oz), SpO2 98 %.    IV in place, occlusive dsg intact without redness.  Orientation to room, and floor completed with information packet given to patient/family.  Patient declined safety video at this time.  Admission INP armband ID verified with patient/family, and in place.   SR up x 2, fall assessment complete, with patient and family able to verbalize understanding of risk associated with falls, and verbalized understanding to call nsg before up out of bed.  Call light within reach, patient able to voice, and demonstrate understanding.  Skin, clean-dry- intact without evidence of bruising, or skin tears.   No evidence of skin break down noted on exam.     Will cont to eval and treat per MD orders.  Trey Sailors, [place], RN

## 2014-06-26 NOTE — ED Notes (Signed)
ENDOSCOPY Completed.

## 2014-06-26 NOTE — Progress Notes (Signed)
NURSING PROGRESS NOTE  RUVEN CORRADI [id number] Discharge Data: 06/26/2014 1:08 PM Attending Provider: Domenic Polite, MD GOT:[id number] Deborra Medina, MD   [name] to be D/C'd Home per MD order.    All IV's will be discontinued and monitored for bleeding.  All belongings will be returned to patient for patient to take home.  Last Documented Vital Signs:  Blood pressure 134/65, pulse 80, temperature 98.7 F (37.1 C), temperature source Oral, resp. rate 18, height '5\' 7"'$  (1.702 m), weight 73.4 kg (161 lb 13.1 oz), SpO2 95 %.  Joslyn Hy, MSN, RN, CMSRN     \

## 2014-06-27 ENCOUNTER — Emergency Department
Admission: EM | Admit: 2014-06-27 | Discharge: 2014-06-27 | Disposition: A | Discharge status: Home / Self Care | Payer: Medicare Other | Attending: Emergency Medicine | Admitting: Emergency Medicine

## 2014-06-27 ENCOUNTER — Encounter: Payer: Self-pay | Admitting: Emergency Medicine

## 2014-06-27 DIAGNOSIS — Z7982 Long term (current) use of aspirin: Secondary | ICD-10-CM | POA: Insufficient documentation

## 2014-06-27 DIAGNOSIS — Y9289 Other specified places as the place of occurrence of the external cause: Secondary | ICD-10-CM | POA: Diagnosis not present

## 2014-06-27 DIAGNOSIS — Y9389 Activity, other specified: Secondary | ICD-10-CM | POA: Insufficient documentation

## 2014-06-27 DIAGNOSIS — Y998 Other external cause status: Secondary | ICD-10-CM | POA: Insufficient documentation

## 2014-06-27 DIAGNOSIS — L500 Allergic urticaria: Secondary | ICD-10-CM | POA: Insufficient documentation

## 2014-06-27 DIAGNOSIS — L509 Urticaria, unspecified: Secondary | ICD-10-CM

## 2014-06-27 DIAGNOSIS — I10 Essential (primary) hypertension: Secondary | ICD-10-CM | POA: Diagnosis not present

## 2014-06-27 DIAGNOSIS — Z79899 Other long term (current) drug therapy: Secondary | ICD-10-CM | POA: Diagnosis not present

## 2014-06-27 DIAGNOSIS — X58XXXA Exposure to other specified factors, initial encounter: Secondary | ICD-10-CM | POA: Insufficient documentation

## 2014-06-27 DIAGNOSIS — Z794 Long term (current) use of insulin: Secondary | ICD-10-CM | POA: Insufficient documentation

## 2014-06-27 DIAGNOSIS — Z720 Tobacco use: Secondary | ICD-10-CM | POA: Insufficient documentation

## 2014-06-27 DIAGNOSIS — E119 Type 2 diabetes mellitus without complications: Secondary | ICD-10-CM | POA: Insufficient documentation

## 2014-06-27 DIAGNOSIS — T7840XA Allergy, unspecified, initial encounter: Secondary | ICD-10-CM | POA: Diagnosis present

## 2014-06-27 MED ORDER — DIPHENHYDRAMINE HCL 25 MG PO CAPS: 25.0000 mg | ORAL_CAPSULE | ORAL | Status: DC | PRN

## 2014-06-27 MED ORDER — PREDNISONE 10 MG PO TABS: ORAL_TABLET | ORAL | Status: DC

## 2014-06-27 NOTE — ED Notes (Signed)
Pt reports that he developed itching three days ago, he noticed that his eyes are getting swollen. He states that he has not  had any new medication, soap or food. He reports that he now has a rash on his back. He is able to speak in complete clear sentences. He is not having any trouble breathing.

## 2014-06-27 NOTE — ED Provider Notes (Signed)
CSN: [id number]     Arrival date & time 06/27/14  5170 History   First MD Initiated Contact with Patient 06/27/14 0914     Chief Complaint  Patient presents with  . Allergic Reaction     HPI Comments: 69 year old male presents today complaining of facial swelling and rash all over her body. He reports that he has been hospitalized twice recently. He was discharged just a few days ago from [facility] after having a foreign body removed from his esophagus. He has not started any new medications in the past month. He currently has an indwelling catheter. However, this has been in place over a week. He denies sensation of throat closing or difficulty breathing. He has tried hydrocortisone cream without relief. He has a known allergy to Clorox. No new lotions, soaps or detergents.  Patient is a 69 y.o. male presenting with allergic reaction. The history is provided by the patient.  Allergic Reaction Presenting symptoms: itching, rash and swelling   Presenting symptoms: no difficulty breathing, no difficulty swallowing and no wheezing   Rash:    Location:  Chest, leg, arm, buttocks and groin   Quality: itchiness and redness     Severity:  Moderate   Onset quality:  Gradual   Timing:  Constant   Progression:  Worsening Severity:  Mild Prior episodes: chlorox. Context: no animal exposure, no chemicals, no cosmetics, no dairy/milk products, no eggs, no food allergies, no grass, no insect bite/sting, no jewelry/metal, no medications, no nuts and no poison ivy   Context comment:  Recent stay in hospital Relieved by:  Nothing Ineffective treatments:  OTC ointments   Past Medical History  Diagnosis Date  . Hypertension   . Hyperlipidemia   . Diabetes mellitus     Type II  . Gastritis   . Esophageal stricture   . URI (upper respiratory infection)   . PSA elevation     now averaging 2's  . Internal hemorrhoids without mention of complication   . Lumbago   . Tobacco abuse   . Coronary  atherosclerosis of unspecified type of vessel, native or graft   . Hx of colonic polyps   . BPH with urinary obstruction     stable on flomax ([name]   Past Surgical History  Procedure Laterality Date  . Knee arthroscopy  10/00    Right  . Stress myoview  07/06    EF 67%  . 2d echo  06/03  . Ct of head  and sinuses  06/03    Negative  . Lp  06/03  . Abdominal ultrasound  06/03    Negative  . Ct of the chest, abdomen, pelvis  06/03    Chest negative; Abdomen, left adrenal lesion; Pelvis, enlarged prostate  . Persantine cardiolite  03/01    EF 68%  . Esophagogastroduodenoscopy  02/13/07    gastritis and duodenitis without bleed  . Microwave thermotherapy prostate  08/28/07    Rogers Blocker  . Polypectomy  11/95   Family History  Problem Relation Age of Onset  . Diabetes Father   . Alcohol abuse Paternal Uncle   . Heart disease Neg Hx   . Stroke Neg Hx   . Cancer Neg Hx   . Drug abuse Neg Hx   . Depression Neg Hx   . Alcohol abuse Paternal Uncle   . Alcohol abuse Paternal Uncle   . Rheum arthritis Mother   . Colon cancer Neg Hx    History  Substance Use  Topics  . Smoking status: Current Every Day Smoker -- 1.00 packs/day for 40 years    Types: Cigarettes  . Smokeless tobacco: Never Used  . Alcohol Use: 0.0 oz/week    0 Standard drinks or equivalent per week     Comment: rarely    Review of Systems  HENT: Negative for drooling, trouble swallowing and voice change.   Respiratory: Negative for chest tightness, shortness of breath and wheezing.   Skin: Positive for itching and rash.  All other systems reviewed and are negative.     Allergies  Review of patient's allergies indicates no known allergies.  Home Medications   Prior to Admission medications   Medication Sig Start Date End Date Taking? Authorizing Provider  aspirin 81 MG tablet Take 81 mg by mouth daily.    Historical Provider, MD  atorvastatin (LIPITOR) 20 MG tablet TAKE ONE TABLET BY MOUTH EVERY DAY  06/05/14   Lucille Passy, MD  diphenhydrAMINE (BENADRYL) 25 mg capsule Take 1 capsule (25 mg total) by mouth every 4 (four) hours as needed. 06/27/14 06/27/15  Shayne Alken V, PA-C  glipiZIDE (GLUCOTROL) 5 MG tablet Take 5 mg by mouth daily before breakfast.    Historical Provider, MD  insulin detemir (LEVEMIR) 100 UNIT/ML injection Inject 20 units at bedtime. 07/08/12   Lucille Passy, MD  metFORMIN (GLUCOPHAGE) 500 MG tablet TAKE TWO TABLETS BY MOUTH TWICE DAILY WITH A MEAL 04/18/13   Lucille Passy, MD  metoprolol (LOPRESSOR) 50 MG tablet Take 25 mg by mouth 2 (two) times daily.    Historical Provider, MD  pantoprazole (PROTONIX) 40 MG tablet Take 1 tablet (40 mg total) by mouth 2 (two) times daily. 06/26/14   Domenic Polite, MD  predniSONE (DELTASONE) 10 MG tablet Taper: 6, 5, 4, 3, 2, 1 06/27/14   Shayne Alken V, PA-C  ramipril (ALTACE) 10 MG capsule TAKE ONE CAPSULE BY MOUTH EVERY DAY 03/11/14   Lucille Passy, MD  tamsulosin (FLOMAX) 0.4 MG CAPS capsule Take 1 capsule (0.4 mg total) by mouth daily. 06/13/14   Lavonia Drafts, MD   BP 139/66 mmHg  Pulse 101  Temp(Src) 98.2 F (36.8 C) (Oral)  Ht '5\' 6"'$  (1.676 m)  Wt 155 lb (70.308 kg)  BMI 25.03 kg/m2  SpO2 96% Physical Exam  Constitutional: He is oriented to person, place, and time. He appears well-developed and well-nourished.  HENT:  Head: Normocephalic and atraumatic.  Mouth/Throat: Oropharynx is clear and moist.  No swelling to oropharynx.  Cardiovascular: Normal rate, regular rhythm and normal heart sounds.   Pulmonary/Chest: Effort normal and breath sounds normal. No respiratory distress. He has no wheezes. He has no rales.  Musculoskeletal: Normal range of motion.  Neurological: He is alert and oriented to person, place, and time.  Skin: Skin is dry. Rash noted.  Urticaria present to legs, arms, chest, back   Psychiatric: He has a normal mood and affect. His behavior is normal.  Nursing note and vitals reviewed.   ED Course  Procedures  (including critical care time) Labs Review Labs Reviewed - No data to display  Imaging Review Dg Chest 2 View  06/25/2014   CLINICAL DATA:  Chest pain, dysphagia  EXAM: CHEST  2 VIEW  COMPARISON:  09/12/2011  FINDINGS: Cardiomediastinal silhouette is stable. No acute infiltrate or pleural effusion. No pulmonary edema. Mild degenerative changes thoracic spine. Mild hyperinflation again noted.  IMPRESSION: No active cardiopulmonary disease.  Mild hyperinflation again noted.   Electronically Signed  By: Lahoma Crocker M.D.   On: 06/25/2014 17:20     EKG Interpretation None      MDM  Patient has urticaria all over body. In no obvious distress. He did have an episode like this in the past with Clorox exposure. It is possible some of the items he was exposed to in the hospital were cleaned with Clorox. He has no new medications or other obvious causative factors. Treat with 6 day prednisone taper. Benadryl as needed for itching. Final diagnoses:  Urticaria        Corliss Parish, PA-C 06/27/14 1001  Lisa Roca, MD 06/29/14 1515

## 2014-06-29 ENCOUNTER — Encounter (HOSPITAL_COMMUNITY): Payer: Self-pay | Admitting: Gastroenterology

## 2014-07-09 ENCOUNTER — Ambulatory Visit (INDEPENDENT_AMBULATORY_CARE_PROVIDER_SITE_OTHER): Payer: Medicare Other | Admitting: Urology

## 2014-07-09 VITALS — BP 104/76 | HR 85 | Ht 67.0 in | Wt 162.3 lb

## 2014-07-09 DIAGNOSIS — Z466 Encounter for fitting and adjustment of urinary device: Secondary | ICD-10-CM | POA: Diagnosis not present

## 2014-07-09 DIAGNOSIS — R339 Retention of urine, unspecified: Secondary | ICD-10-CM | POA: Diagnosis not present

## 2014-07-09 NOTE — Progress Notes (Signed)
Bladder Scan Patient cannot void: 438 ml Performed By: Toniann Fail, LPN  Continuous Intermittent Catheterization  Due to urinary retention patient is present today for a teaching of self I & O Catheterization. Patient was given detailed verbal and printed instructions of self catheterization. Patient was cleaned and prepped in a sterile fashion.  With instruction and assistance patient inserted a 14FR and urine return was noted 450 ml, urine was yellow in color. Patient tolerated well, no complications were noted Patient was given a sample bag with supplies to take home.  Instructions were given per Zara Council for patient to cath two times daily.  An order was placed with [facility] for catheters to be sent to the patient's home. Patient is to follow up with Zara Council, PA tomorrow.   Preformed by: Toniann Fail, LPN

## 2014-07-09 NOTE — Progress Notes (Signed)
Catheter Removal  Patient is present today for a catheter removal.  73m of water was drained from the balloon. A 16FR foley cath was removed from the bladder no complications were noted . Patient tolerated well.  Preformed by: SFonnie Jarvis CMA  Follow up/ Additional notes: Patient is to return at 3pm this afternoon for a PVR, he was given detailed instruction on drinking plenty of water today prior to returning.

## 2014-07-10 ENCOUNTER — Ambulatory Visit (INDEPENDENT_AMBULATORY_CARE_PROVIDER_SITE_OTHER): Payer: Medicare Other | Admitting: Urology

## 2014-07-10 ENCOUNTER — Encounter: Payer: Self-pay | Admitting: Urology

## 2014-07-10 VITALS — BP 123/74 | HR 75 | Ht 67.0 in | Wt 163.6 lb

## 2014-07-10 DIAGNOSIS — R339 Retention of urine, unspecified: Secondary | ICD-10-CM

## 2014-07-10 LAB — BLADDER SCAN AMB NON-IMAGING: SCAN RESULT: 27

## 2014-07-11 LAB — BASIC METABOLIC PANEL
BUN / CREAT RATIO: 15 (ref 10–22)
BUN: 12 mg/dL (ref 8–27)
CALCIUM: 8.8 mg/dL (ref 8.6–10.2)
CO2: 25 mmol/L (ref 18–29)
CREATININE: 0.79 mg/dL (ref 0.76–1.27)
Chloride: 100 mmol/L (ref 97–108)
GFR calc Af Amer: 106 mL/min/{1.73_m2} (ref >59)
GFR, EST NON AFRICAN AMERICAN: 92 mL/min/{1.73_m2} (ref >59)
GLUCOSE: 185 mg/dL — AB (ref 65–99)
Potassium: 4.2 mmol/L (ref 3.5–5.2)
SODIUM: 141 mmol/L (ref 134–144)

## 2014-07-11 NOTE — Progress Notes (Addendum)
07/10/2014 11:39 PM   Antonio [name] 15-Oct-1945 [id number]  Referring provider: Lucille Passy, MD [place], [place] [id number]  Chief Complaint  Patient presents with  . Follow-up    PVR and CIC    HPI: Patient was seen in the emergency room [facility], 06/20/2014) for urinary retention. He presented to our office 4 days later for a voiding trial. He had a fill and spill performed in the office. He was started on finasteride and his Flomax was increased to 2 capsules daily. He was to return to our office in 3 months for an IPSS, PVR and PSA.  Sometime later, he went into retention again and another Foley catheter was placed. He presented to our office on 07/09/2014 for Foley catheter removal. Foley catheter was removed that morning. He returned  in the afternoon in urinary retention. He was instructed on CIC and he returns today to check on his progress. Patient is still taking the medication as prescribed by Dr. Louis Meckel. He is having great success with catheterizing himself. His PVR was minimal today.  Background story:  Patient has a long history of BPH. Had a Microwave procedure 8 yrs ago by Dr. Yves Dill. Did well for 5 yrs or so, had an episode of urinary retention again in 2014 and had a catheter temporarily at that point. He then developed urinary retention again last month. He was sent to [facility] for follow-up and had his catheter removed. He followed up 24 hours later and PVR was checked, and ok. he then developed retention again several days later and was seen in the [facility] ED.   Baseline his stream is weak, doesn't empty completely. No history of UTIs.      PMH: Past Medical History  Diagnosis Date  . Hypertension   . Hyperlipidemia   . Diabetes mellitus     Type II  . Gastritis   . Esophageal stricture   . URI (upper respiratory infection)   . PSA elevation     now averaging 2's  . Internal hemorrhoids without mention of complication   . Lumbago   .  Tobacco abuse   . Coronary atherosclerosis of unspecified type of vessel, native or graft   . Hx of colonic polyps   . BPH with urinary obstruction     stable on flomax ([name]    Surgical History: Past Surgical History  Procedure Laterality Date  . Knee arthroscopy  10/00    Right  . Stress myoview  07/06    EF 67%  . 2d echo  06/03  . Ct of head  and sinuses  06/03    Negative  . Lp  06/03  . Abdominal ultrasound  06/03    Negative  . Ct of the chest, abdomen, pelvis  06/03    Chest negative; Abdomen, left adrenal lesion; Pelvis, enlarged prostate  . Persantine cardiolite  03/01    EF 68%  . Esophagogastroduodenoscopy  02/13/07    gastritis and duodenitis without bleed  . Microwave thermotherapy prostate  08/28/07    Rogers Blocker  . Polypectomy  11/95  . Esophagogastroduodenoscopy N/A 06/26/2014    Procedure: ESOPHAGOGASTRODUODENOSCOPY (EGD);  Surgeon: Milus Banister, MD;  Location: [place];  Service: Endoscopy;  Laterality: N/A;    Home Medications:    Medication List       This list is accurate as of: 07/10/14 11:59 PM.  Always use your most recent med list.  aspirin 81 MG tablet  Take 81 mg by mouth daily.     atorvastatin 20 MG tablet  Commonly known as:  LIPITOR  TAKE ONE TABLET BY MOUTH EVERY DAY     diphenhydrAMINE 25 mg capsule  Commonly known as:  BENADRYL  Take 1 capsule (25 mg total) by mouth every 4 (four) hours as needed.     finasteride 5 MG tablet  Commonly known as:  PROSCAR     glipiZIDE 5 MG tablet  Commonly known as:  GLUCOTROL  Take 5 mg by mouth daily before breakfast.     glipiZIDE 10 MG tablet  Commonly known as:  GLUCOTROL     insulin detemir 100 UNIT/ML injection  Commonly known as:  LEVEMIR  Inject 20 units at bedtime.     metFORMIN 500 MG tablet  Commonly known as:  GLUCOPHAGE  TAKE TWO TABLETS BY MOUTH TWICE DAILY WITH A MEAL     metoprolol 50 MG tablet  Commonly known as:  LOPRESSOR  Take 25 mg by  mouth 2 (two) times daily.     pantoprazole 40 MG tablet  Commonly known as:  PROTONIX  Take 1 tablet (40 mg total) by mouth 2 (two) times daily.     predniSONE 10 MG tablet  Commonly known as:  DELTASONE  Taper: 6, 5, 4, 3, 2, 1     ramipril 10 MG capsule  Commonly known as:  ALTACE  TAKE ONE CAPSULE BY MOUTH EVERY DAY     tamsulosin 0.4 MG Caps capsule  Commonly known as:  FLOMAX  Take 1 capsule (0.4 mg total) by mouth daily.        Allergies: No Known Allergies  Family History: Family History  Problem Relation Age of Onset  . Diabetes Father   . Alcohol abuse Paternal Uncle   . Heart disease Neg Hx   . Stroke Neg Hx   . Cancer Neg Hx   . Drug abuse Neg Hx   . Depression Neg Hx   . Alcohol abuse Paternal Uncle   . Alcohol abuse Paternal Uncle   . Rheum arthritis Mother   . Colon cancer Neg Hx     Social History:  reports that he has been smoking Cigarettes.  He has a 40 pack-year smoking history. He has never used smokeless tobacco. He reports that he drinks alcohol. He reports that he does not use illicit drugs.  ROS: Urological Symptom Review  Patient is experiencing the following symptoms: Frequent urination Get up at night to urinate Blood in urine Weak stream   Review of Systems  Gastrointestinal (upper)  : Vomiting  Gastrointestinal (lower) : Negative for lower GI symptoms  Constitutional : Negative for symptoms  Skin: Negative for skin symptoms  Eyes: Negative for eye symptoms  Ear/Nose/Throat : Negative for Ear/Nose/Throat symptoms  Hematologic/Lymphatic: Negative for Hematologic/Lymphatic symptoms  Cardiovascular : Negative for cardiovascular symptoms  Respiratory : Negative for respiratory symptoms  Endocrine: Negative for endocrine symptoms  Musculoskeletal: Negative for musculoskeletal symptoms  Neurological: Negative for neurological symptoms  Psychologic: Negative for psychiatric symptoms   Physical Exam: BP  123/74 mmHg  Pulse 75  Ht '5\' 7"'$  (1.702 m)  Wt 163 lb 9.6 oz (74.208 kg)  BMI 25.62 kg/m2   Laboratory Data: Lab Results  Component Value Date   WBC 13.7* 06/26/2014   HGB 14.9 06/26/2014   HCT 43.5 06/26/2014   MCV 86.0 06/26/2014   PLT 291 06/26/2014    Lab Results  Component  Value Date   CREATININE 0.79 07/10/2014    Lab Results  Component Value Date   PSA 1.94 08/08/2011   PSA 1.73 04/24/2011   PSA 1.74 11/19/2006    No results found for: TESTOSTERONE  Lab Results  Component Value Date   HGBA1C 7.9* 10/22/2013    Urinalysis    Component Value Date/Time   COLORURINE YELLOW* 06/20/2014 0811   APPEARANCEUR CLEAR* 06/20/2014 0811   LABSPEC 1.011 06/20/2014 0811   PHURINE 6.0 06/20/2014 0811   GLUCOSEU 150* 06/20/2014 0811   HGBUR 3+* 06/20/2014 0811   BILIRUBINUR NEGATIVE 06/20/2014 0811   BILIRUBINUR negative 08/08/2011 1152   [place] 06/20/2014 0811   PROTEINUR 100* 06/20/2014 0811   PROTEINUR negative 08/08/2011 1152   UROBILINOGEN negative 08/08/2011 1152   UROBILINOGEN 0.2 12/23/2006 0058   NITRITE NEGATIVE 06/20/2014 0811   NITRITE negative 08/08/2011 1152   LEUKOCYTESUR TRACE* 06/20/2014 0811    Pertinent Imaging: Results for AGOSTINO, GORIN (MRN [id number]) as of 07/12/2014 19:57  Ref. Range 07/10/2014 10:27  Scan Result Unknown 27    Assessment & Plan:   1. Urinary retention- Patient will continue CIC, keeping his residuals below 200 cc.  He will need to cath himself twice daily indefinitely.  He will follow-up in 3 months time and we will review his residuals at that time. He will continue his tamsulosin 0.4 mg 2 capsules daily and finasteride 5 mg 1 tablet daily.  We will also obtain an IVP SS, PVR, DRE and PSA.       - Basic metabolic panel - Bladder Scan (Post Void Residual) in office  2. Active smoker-  We will address at his three month follow up.     No Follow-up on file.  Zara Council, [facility] [address], [place], [place] [address] [phone]

## 2014-07-12 DIAGNOSIS — R339 Retention of urine, unspecified: Secondary | ICD-10-CM | POA: Insufficient documentation

## 2014-07-20 ENCOUNTER — Ambulatory Visit (INDEPENDENT_AMBULATORY_CARE_PROVIDER_SITE_OTHER): Payer: Medicare Other | Admitting: Physician Assistant

## 2014-07-20 ENCOUNTER — Ambulatory Visit: Payer: Medicare Other | Admitting: Physician Assistant

## 2014-07-20 ENCOUNTER — Encounter (INDEPENDENT_AMBULATORY_CARE_PROVIDER_SITE_OTHER): Payer: Self-pay

## 2014-07-20 ENCOUNTER — Encounter: Payer: Self-pay | Admitting: Physician Assistant

## 2014-07-20 VITALS — BP 96/60 | HR 64 | Ht 65.75 in | Wt 161.4 lb

## 2014-07-20 DIAGNOSIS — K209 Esophagitis, unspecified without bleeding: Secondary | ICD-10-CM

## 2014-07-20 DIAGNOSIS — Z1211 Encounter for screening for malignant neoplasm of colon: Secondary | ICD-10-CM | POA: Diagnosis not present

## 2014-07-20 DIAGNOSIS — K2100 Gastro-esophageal reflux disease with esophagitis, without bleeding: Secondary | ICD-10-CM

## 2014-07-20 MED ORDER — PANTOPRAZOLE SODIUM 40 MG PO TBEC: 40.0000 mg | DELAYED_RELEASE_TABLET | Freq: Every day | ORAL | Status: DC

## 2014-07-20 NOTE — Progress Notes (Signed)
Patient ID: Antonio [name], male   DOB: May 26, 1945, 69 y.o.   MRN: [id number]   Subjective:    Patient ID: Antonio [name], male    DOB: August 31, 1945, 69 y.o.   MRN: [id number]  HPI Mehmet is a pleasant 69 year old white male known to Dr. Fuller Plan from previous procedures. He had undergone colonoscopy in 2005 which was negative with the exception of internal hemorrhoids. Patient had had a recent hospitalization 519 through 06/26/2014 with complaints of acute dysphagia. It was felt he may have had a food impaction and underwent EGD per Dr. Ardis Hughs which showed the GE junction to be edematous and red but no obvious reflux related inflammation or esophagitis there was some narrowing felt secondary to edema, no stricture and scope was able to be passed into the stomach without resistance. Upper GI tract was otherwise normal. Patient had had abrupt onset of his symptoms after taking his usual handful of pills that morning. He had also been on Macrobid at that time for a UTI and it was suspected that he had a pill-induced esophageal trauma/esophagitis. Post EGD his symptoms settle down he was discharged the following day. He comes in today for office follow-up. He says he hasn't had any difficulty with swallowing since that time. He denies any heartburn or indigestion, no dysphagia or odynophagia. He has been taking pantoprazole 40 mg by mouth twice a day. He has no lower GI complaints, specifically no problems with abdominal pain and changes in his bowel habits melena or hematochezia. He knows he is due for colonoscopy and would like to schedule that.  Review of Systems Pertinent positive and negative review of systems were noted in the above HPI section.  All other review of systems was otherwise negative.     Past Surgical History  Procedure Laterality Date  . Knee arthroscopy  10/00    Right  . Stress myoview  07/06    EF 67%  . 2d echo  06/03  . Ct of head  and sinuses  06/03    Negative  . Lp  06/03    . Abdominal ultrasound  06/03    Negative  . Ct of the chest, abdomen, pelvis  06/03    Chest negative; Abdomen, left adrenal lesion; Pelvis, enlarged prostate  . Persantine cardiolite  03/01    EF 68%  . Esophagogastroduodenoscopy  02/13/07    gastritis and duodenitis without bleed  . Microwave thermotherapy prostate  08/28/07    Rogers Blocker  . Polypectomy  11/95  . Esophagogastroduodenoscopy N/A 06/26/2014    Procedure: ESOPHAGOGASTRODUODENOSCOPY (EGD);  Surgeon: Milus Banister, MD;  Location: [place];  Service: Endoscopy;  Laterality: N/A;    Prior to Admission medications   Medication Sig Start Date End Date Taking? Authorizing Provider  aspirin 81 MG tablet Take 81 mg by mouth daily.   Yes Historical Provider, MD  atorvastatin (LIPITOR) 20 MG tablet TAKE ONE TABLET BY MOUTH EVERY DAY 06/05/14  Yes Lucille Passy, MD  diphenhydrAMINE (BENADRYL) 25 mg capsule Take 1 capsule (25 mg total) by mouth every 4 (four) hours as needed. 06/27/14 06/27/15 Yes Shayne Alken V, PA-C  finasteride (PROSCAR) 5 MG tablet  06/25/14  Yes Historical Provider, MD  glipiZIDE (GLUCOTROL) 10 MG tablet Take 10 mg by mouth daily before breakfast.  07/05/14  Yes Historical Provider, MD  insulin detemir (LEVEMIR) 100 UNIT/ML injection Inject 20 units at bedtime. 07/08/12  Yes Lucille Passy, MD  metFORMIN (GLUCOPHAGE) 500 MG tablet TAKE  TWO TABLETS BY MOUTH TWICE DAILY WITH A MEAL 04/18/13  Yes Lucille Passy, MD    Allergies as of 07/20/2014  . (No Known Allergies)    Family History  Problem Relation Age of Onset  . Diabetes Father   . Alcohol abuse Paternal Uncle   . Heart disease Neg Hx   . Stroke Neg Hx   . Cancer Neg Hx   . Drug abuse Neg Hx   . Depression Neg Hx   . Alcohol abuse Paternal Uncle   . Alcohol abuse Paternal Uncle   . Rheum arthritis Mother   . Colon cancer Neg Hx     .        Outpatient Encounter Prescriptions as of 07/20/2014  Medication Sig  . aspirin 81 MG tablet Take 81 mg by  mouth daily.  [name] atorvastatin (LIPITOR) 20 MG tablet TAKE ONE TABLET BY MOUTH EVERY DAY  . diphenhydrAMINE (BENADRYL) 25 mg capsule Take 1 capsule (25 mg total) by mouth every 4 (four) hours as needed.  . finasteride (PROSCAR) 5 MG tablet   . glipiZIDE (GLUCOTROL) 10 MG tablet Take 10 mg by mouth daily before breakfast.   . insulin detemir (LEVEMIR) 100 UNIT/ML injection Inject 20 units at bedtime.  . metFORMIN (GLUCOPHAGE) 500 MG tablet TAKE TWO TABLETS BY MOUTH TWICE DAILY WITH A MEAL  . metoprolol (LOPRESSOR) 50 MG tablet Take 25 mg by mouth 2 (two) times daily.  . pantoprazole (PROTONIX) 40 MG tablet Take 1 tablet (40 mg total) by mouth daily.  . ramipril (ALTACE) 10 MG capsule TAKE ONE CAPSULE BY MOUTH EVERY DAY  . tamsulosin (FLOMAX) 0.4 MG CAPS capsule Take 0.8 mg by mouth daily.  . [DISCONTINUED] glipiZIDE (GLUCOTROL) 5 MG tablet Take 5 mg by mouth daily before breakfast.  . [DISCONTINUED] pantoprazole (PROTONIX) 40 MG tablet Take 1 tablet (40 mg total) by mouth 2 (two) times daily.  . [DISCONTINUED] predniSONE (DELTASONE) 10 MG tablet Taper: 6, 5, 4, 3, 2, 1 (Patient not taking: Reported on 07/10/2014)  . [DISCONTINUED] tamsulosin (FLOMAX) 0.4 MG CAPS capsule Take 1 capsule (0.4 mg total) by mouth daily. (Patient taking differently: Take 0.4 mg by mouth 2 (two) times daily. )   No facility-administered encounter medications on file as of 07/20/2014.   No Known Allergies Patient Active Problem List   Diagnosis Date Noted  . Urinary retention 07/12/2014  . Hypertension 06/26/2014  . Dysphagia 06/26/2014  . Hypertension, uncontrolled 06/26/2014  . Diabetes mellitus type 2, controlled 06/26/2014  . Hyperlipidemia 06/26/2014  . Essential hypertension   . Pain in the chest 06/25/2014  . Other dysphagia 12/04/2012  . Bladder outlet obstruction 08/10/2011  . Weight loss, abnormal 04/24/2011  . ESOPHAGEAL STRICTURE 12/26/2006  . GASTRITIS 12/26/2006  . HYPERTENSION, BENIGN ESSENTIAL  11/19/2006  . PSA, INCREASED 11/19/2006  . DIABETES MELLITUS, TYPE II 06/27/2006  . HYPERLIPIDEMIA 06/27/2006  . CORONARY ARTERY DISEASE 06/27/2006  . HEMORRHOIDS, INTERNAL 06/27/2006  . LOW BACK PAIN, CHRONIC 06/27/2006  . TOBACCO ABUSE, HX OF 06/27/2006   History   Social History  . Marital Status: Married    Spouse Name: [name]  . Number of Children: 3  . Years of Education: N/A   Occupational History  . Retired Games developer    Social History Main Topics  . Smoking status: Current Every Day Smoker -- 1.00 packs/day for 40 years    Types: Cigarettes  . Smokeless tobacco: Never Used  . Alcohol Use: 0.0 oz/week  0 Standard drinks or equivalent per week     Comment: rarely  . Drug Use: No  . Sexual Activity: Not on file   Other Topics Concern  . Not on file   Social History Narrative   Married 9/09 wife with moderate memory problems.   3 adult children, 2 grandchildren    Mr. Scheidt family history includes Alcohol abuse in his paternal uncle, paternal uncle, and paternal uncle; Diabetes in his father; Rheum arthritis in his mother. There is no history of Heart disease, Stroke, Cancer, Drug abuse, Depression, or Colon cancer.      Objective:    Filed Vitals:   07/20/14 0928  BP: 96/60  Pulse: 64    Physical Exam  well-developed older white male in no acute distress, pleasant blood pressure 96/60 pulse 64 height 5 foot 5 weight 161. HEENT;nontraumatic normocephalic EOMI PERRLA sclera anicteric, Supple ;no JVD, Cardiovascular; regular rate and rhythm with S1-S2 no murmur or gallop, Pulmonary; clear bilaterally appetite abdomen soft nontender nondistended bowel sounds are active there is no palpable mass or hepatosplenomegaly,, Rectal; exam not done, Ext; no clubbing cyanosis or edema skin warm and dry, Psych ;mood and affect appropriate       Assessment & Plan:   #1 69 yo male with an acute episode of dysphagia with vomiting likely a pill-induced esophageal  trauma/esophagitis. Symptoms have resolved. #2: Neoplasia screening last colonoscopy 2005 negative due for follow-up #3 adult-onset diabetes mellitus #4 hypertension #5 coronary artery disease  Plan; patient will continue Protonix 40 mg by mouth every morning. He is advised to call for appointment should he develop any recurrent problems with dysphagia. Patient will be scheduled for colonoscopy with Dr. Fuller Plan. Procedure was discussed in detail with patient and he is agreeable to proceed.   Amy Genia Harold PA-C 07/20/2014   Cc: Lucille Passy, MD

## 2014-07-20 NOTE — Progress Notes (Signed)
Reviewed and agree with management plan.  Melany Wiesman T. Saramarie Stinger, MD FACG 

## 2014-07-20 NOTE — Patient Instructions (Addendum)
You have been given a separate informational sheet regarding your tobacco use, the importance of quitting and local resources to help you quit. You have been scheduled for a colonoscopy. Please follow written instructions given to you at your visit today.   If you use inhalers (even only as needed), please bring them with you on the day of your procedure.  We sent prescription for refills for the Protonix 40 mg.

## 2014-08-05 ENCOUNTER — Ambulatory Visit (AMBULATORY_SURGERY_CENTER): Payer: Medicare Other | Admitting: Gastroenterology

## 2014-08-05 ENCOUNTER — Encounter: Payer: Self-pay | Admitting: Gastroenterology

## 2014-08-05 VITALS — BP 110/64 | HR 68 | Temp 96.7°F | Resp 33 | Ht 65.75 in | Wt 161.0 lb

## 2014-08-05 DIAGNOSIS — D123 Benign neoplasm of transverse colon: Secondary | ICD-10-CM | POA: Diagnosis not present

## 2014-08-05 DIAGNOSIS — Z1211 Encounter for screening for malignant neoplasm of colon: Secondary | ICD-10-CM

## 2014-08-05 DIAGNOSIS — I2510 Atherosclerotic heart disease of native coronary artery without angina pectoris: Secondary | ICD-10-CM | POA: Diagnosis not present

## 2014-08-05 DIAGNOSIS — D120 Benign neoplasm of cecum: Secondary | ICD-10-CM

## 2014-08-05 DIAGNOSIS — E669 Obesity, unspecified: Secondary | ICD-10-CM | POA: Diagnosis not present

## 2014-08-05 DIAGNOSIS — I10 Essential (primary) hypertension: Secondary | ICD-10-CM | POA: Diagnosis not present

## 2014-08-05 DIAGNOSIS — E119 Type 2 diabetes mellitus without complications: Secondary | ICD-10-CM | POA: Diagnosis not present

## 2014-08-05 DIAGNOSIS — E780 Pure hypercholesterolemia: Secondary | ICD-10-CM | POA: Diagnosis not present

## 2014-08-05 DIAGNOSIS — K219 Gastro-esophageal reflux disease without esophagitis: Secondary | ICD-10-CM | POA: Diagnosis not present

## 2014-08-05 LAB — GLUCOSE, CAPILLARY
Glucose-Capillary: 128 mg/dL — ABNORMAL HIGH (ref 65–99)
Glucose-Capillary: 128 mg/dL — ABNORMAL HIGH (ref 65–99)

## 2014-08-05 MED ORDER — SODIUM CHLORIDE 0.9 % IV SOLN: 500.0000 mL | INTRAVENOUS | Status: DC

## 2014-08-05 NOTE — Progress Notes (Signed)
No problems noted in the recovery room. maw 

## 2014-08-05 NOTE — Op Note (Signed)
[place]  [address] [place], [id number]   COLONOSCOPY PROCEDURE REPORT  PATIENT: Antonio, [name]  MR#: [id number] BIRTHDATE: 02/26/1945 , 32  yrs. old GENDER: male ENDOSCOPIST: Ladene Artist, MD, [facility] REFERRED LK:HVFMB Aron, M.D. PROCEDURE DATE:  08/05/2014 PROCEDURE:   Colonoscopy, screening and Colonoscopy with snare polypectomy First Screening Colonoscopy - Avg.  risk and is 50 yrs.  old or older - No.  Prior Negative Screening - Now for repeat screening. 10 or more years since last screening  History of Adenoma - Now for follow-up colonoscopy & has been > or = to 3 yrs.  N/A  Polyps removed today? Yes ASA CLASS:   Class III INDICATIONS:Screening for colonic neoplasia and Colorectal Neoplasm Risk Assessment for this procedure is average risk. MEDICATIONS: Monitored anesthesia care and Propofol 200 mg IV  DESCRIPTION OF PROCEDURE:   After the risks benefits and alternatives of the procedure were thoroughly explained, informed consent was obtained.  The digital rectal exam revealed no abnormalities of the rectum.   The LB [id number]  endoscope was introduced through the anus and advanced to the cecum, which was identified by both the appendix and ileocecal valve. No adverse events experienced.   The quality of the prep was excellent. (Suprep was used)  The instrument was then slowly withdrawn as the colon was fully examined. Estimated blood loss is zero unless otherwise noted in this procedure report.  COLON FINDINGS: A sessile polyp measuring 11 mm in size was found at the cecum.  A polypectomy was performed using snare cautery.  The resection was complete, the polyp tissue was completely retrieved and sent to histology.   A sessile polyp measuring 6 mm in size was found in the transverse colon.  A polypectomy was performed with a cold snare.  The resection was complete, the polyp tissue was completely retrieved and sent to histology.   The  examination was otherwise normal.  Retroflexed views revealed internal Grade I hemorrhoids. The time to cecum = 2.0 Withdrawal time = 10.1   The scope was withdrawn and the procedure completed. COMPLICATIONS: There were no immediate complications.  ENDOSCOPIC IMPRESSION: 1.   Sessile polyp at the cecum; polypectomy performed using snare cautery 2.   Sessile polyp in the transverse colon; polypectomy performed with a cold snare 3.   Grade l internal hemorrhoids  RECOMMENDATIONS: 1.  Hold Aspirin and all other NSAIDS for 2 weeks. 2.  Await pathology results 3.  Repeat colonoscopy in 3 years if larger polyp adenomatous; 5 years if only smaller polyp adenomaous; otherwise 10 years  eSigned:  Ladene Artist, MD, [facility] 08/05/2014 8:51 AM

## 2014-08-05 NOTE — Progress Notes (Signed)
Called to room to assist during endoscopic procedure.  Patient ID and intended procedure confirmed with present staff. Received instructions for my participation in the procedure from the performing physician.  

## 2014-08-05 NOTE — Patient Instructions (Signed)
YOU HAD AN ENDOSCOPIC PROCEDURE TODAY AT [facility]:   Refer to the procedure report that was given to you for any specific questions about what was found during the examination.  If the procedure report does not answer your questions, please call your gastroenterologist to clarify.  If you requested that your care partner not be given the details of your procedure findings, then the procedure report has been included in a sealed envelope for you to review at your convenience later.  YOU SHOULD EXPECT: Some feelings of bloating in the abdomen. Passage of more gas than usual.  Walking can help get rid of the air that was put into your GI tract during the procedure and reduce the bloating. If you had a lower endoscopy (such as a colonoscopy or flexible sigmoidoscopy) you may notice spotting of blood in your stool or on the toilet paper. If you underwent a bowel prep for your procedure, you may not have a normal bowel movement for a few days.  Please Note:  You might notice some irritation and congestion in your nose or some drainage.  This is from the oxygen used during your procedure.  There is no need for concern and it should clear up in a day or so.  SYMPTOMS TO REPORT IMMEDIATELY:   Following lower endoscopy (colonoscopy or flexible sigmoidoscopy):  Excessive amounts of blood in the stool  Significant tenderness or worsening of abdominal pains  Swelling of the abdomen that is new, acute  Fever of 100F or higher    For urgent or emergent issues, a gastroenterologist can be reached at any hour by calling [phone].   DIET: Your first meal following the procedure should be a small meal and then it is ok to progress to your normal diet. Heavy or fried foods are harder to digest and may make you feel nauseous or bloated.  Likewise, meals heavy in dairy and vegetables can increase bloating.  Drink plenty of fluids but you should avoid alcoholic beverages for 24  hours.  ACTIVITY:  You should plan to take it easy for the rest of today and you should NOT DRIVE or use heavy machinery until tomorrow (because of the sedation medicines used during the test).    FOLLOW UP: Our staff will call the number listed on your records the next business day following your procedure to check on you and address any questions or concerns that you may have regarding the information given to you following your procedure. If we do not reach you, we will leave a message.  However, if you are feeling well and you are not experiencing any problems, there is no need to return our call.  We will assume that you have returned to your regular daily activities without incident.  If any biopsies were taken you will be contacted by phone or by letter within the next 1-3 weeks.  Please call us at [phone] if you have not heard about the biopsies in 3 weeks.    SIGNATURES/CONFIDENTIALITY: You and/or your care partner have signed paperwork which will be entered into your electronic medical record.  These signatures attest to the fact that that the information above on your After Visit Summary has been reviewed and is understood.  Full responsibility of the confidentiality of this discharge information lies with you and/or your care-partner.   Handouts were given to your care partner on polyps, hemorrhoids,and a high fiber diet with liberal fluid intake. Your blood sugar was  128 in the recovery room. You may resume your current medication except please hold aspirin and all other NSAIDS for 2 weeks. Await biopsy results. Please call if any questions or concerns.

## 2014-08-05 NOTE — Progress Notes (Signed)
Report to PACU, RN, vss, BBS= Clear.  

## 2014-08-06 ENCOUNTER — Other Ambulatory Visit: Payer: Self-pay | Admitting: *Deleted

## 2014-08-06 ENCOUNTER — Telehealth: Payer: Self-pay | Admitting: *Deleted

## 2014-08-06 MED ORDER — PANTOPRAZOLE SODIUM 40 MG PO TBEC: 40.0000 mg | DELAYED_RELEASE_TABLET | Freq: Every day | ORAL | Status: DC

## 2014-08-06 NOTE — Telephone Encounter (Signed)
  Follow up Call-  Call back number 08/05/2014  Post procedure Call Back phone  # [phone]  Permission to leave phone message No  comments no VM set up     Patient questions:  Do you have a fever, pain , or abdominal swelling? No. Pain Score  0 *  Have you tolerated food without any problems? Yes.    Have you been able to return to your normal activities? Yes.    Do you have any questions about your discharge instructions: Diet   No. Medications  No. Follow up visit  No.  Do you have questions or concerns about your Care? No.  Actions: * If pain score is 4 or above: No action needed, pain <4.

## 2014-08-16 ENCOUNTER — Encounter: Payer: Self-pay | Admitting: Gastroenterology

## 2014-09-28 ENCOUNTER — Telehealth: Payer: Self-pay | Admitting: Urology

## 2014-10-05 ENCOUNTER — Encounter: Payer: Self-pay | Admitting: *Deleted

## 2014-10-14 ENCOUNTER — Ambulatory Visit (INDEPENDENT_AMBULATORY_CARE_PROVIDER_SITE_OTHER): Payer: Medicare Other | Admitting: Urology

## 2014-10-14 ENCOUNTER — Encounter: Payer: Self-pay | Admitting: Urology

## 2014-10-14 VITALS — BP 166/79 | HR 63 | Ht 66.0 in | Wt 161.5 lb

## 2014-10-14 DIAGNOSIS — N138 Other obstructive and reflux uropathy: Secondary | ICD-10-CM

## 2014-10-14 DIAGNOSIS — R339 Retention of urine, unspecified: Secondary | ICD-10-CM | POA: Diagnosis not present

## 2014-10-14 DIAGNOSIS — N401 Enlarged prostate with lower urinary tract symptoms: Secondary | ICD-10-CM

## 2014-10-14 LAB — BLADDER SCAN AMB NON-IMAGING: SCAN RESULT: 38

## 2014-10-14 MED ORDER — TAMSULOSIN HCL 0.4 MG PO CAPS: 0.8000 mg | ORAL_CAPSULE | Freq: Every day | ORAL | Status: DC

## 2014-10-14 NOTE — Progress Notes (Signed)
2:18 PM   Antonio [name] Apr 09, 1945 [id number]  Referring provider: Lucille Passy, MD [place], [place] [address]  Chief Complaint  Patient presents with  . 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  He has been managing his retention issues with CIC when it occurs.  He had a camping expedition over the weekend and had alcohol and experienced retention.  He cathed himself without difficulty.  He is voiding well on his own at this time.  He has not taken his tamsulosin in a few days because he ran out of refills.    Previous history:  Patient was seen in the emergency room [facility], 06/20/2014) for urinary retention. He presented to our office 4 days later for a voiding trial. He passed a fill and spill performed in the office. He was started on finasteride and his Flomax was increased to 2 capsules daily.  Sometime later, he went into retention again and another Foley catheter was placed. He presented to our office on 07/09/2014 for Foley catheter removal. Foley catheter was removed that morning. He returned  in the afternoon in urinary retention. He was instructed on CIC and he returns today to check on his progress.   Background story:  Patient has a long history of BPH. Had a Microwave procedure 8 yrs ago by Dr. Yves Dill. Did well for 5 yrs or so, had an episode of urinary retention again in 2014 and had a catheter temporarily at that point. He then developed urinary retention again last month. He was sent to [facility] for follow-up and had his catheter removed. He followed up 24 hours later and PVR was checked, and ok. He then developed retention again several days later and was seen in the [facility] ED.   His baseline urinary symptoms are frequency, urgency, nocturia, intermittency and weak urinary stream. He is on maximum medical therapy with tamsulosin 0.8 mg daily and  finasteride 5 mg daily.  His urinary symptoms seemed to worsen when he consumes alcohol and he is comfortable with CIC.  His PVR was 38 mL today.  BPH WITH LUTS His IPSS score today is 20, which is severe lower urinary tract symptomatology. He is mixed with his quality life due to his urinary symptoms. His PVR is 38 mL.   His previous PVR is 27 mL.  His major complaint today urinary retention when he drinks alcohol.  He has had these symptoms for two years.  He denies any dysuria, hematuria or suprapubic pain.  He currently taking tamsulosin 0.8 mg daily and finasteride 5 mg daily.   His has had TARGIS approximately 8 years ago.  He also denies any recent fevers, chills, nausea or vomiting.  He does not have a family history of PCa.      IPSS      10/14/14 0800       International Prostate Symptom Score   How often have you had the sensation of not emptying your bladder? About half the time     How often have you had to urinate less than every two hours? More than half the time     How often have you found you stopped and started again several times when you urinated? About half the time     How often have you found it difficult to postpone urination? About half  the time     How often have you had a weak urinary stream? About half the time     How often have you had to strain to start urination? Less than half the time     How many times did you typically get up at night to urinate? 2 Times     Total IPSS Score 20     Quality of Life due to urinary symptoms   If you were to spend the rest of your life with your urinary condition just the way it is now how would you feel about that? Mixed        Score:  1-7 Mild 8-19 Moderate 20-35 Severe    PMH: Past Medical History  Diagnosis Date  . Hypertension   . Hyperlipidemia   . Diabetes mellitus     Type II  . Gastritis   . Esophageal stricture   . URI (upper respiratory infection)   . PSA elevation     now averaging 2's  .  Internal hemorrhoids without mention of complication   . Lumbago   . Tobacco abuse   . Coronary atherosclerosis of unspecified type of vessel, native or graft   . Hx of colonic polyps   . BPH with urinary obstruction     stable on flomax ([name]  . Hiatal hernia   . Urinary retention     Surgical History: Past Surgical History  Procedure Laterality Date  . Knee arthroscopy  10/00    Right  . Stress myoview  07/06    EF 67%  . 2d echo  06/03  . Ct of head  and sinuses  06/03    Negative  . Lp  06/03  . Abdominal ultrasound  06/03    Negative  . Ct of the chest, abdomen, pelvis  06/03    Chest negative; Abdomen, left adrenal lesion; Pelvis, enlarged prostate  . Persantine cardiolite  03/01    EF 68%  . Esophagogastroduodenoscopy  02/13/07    gastritis and duodenitis without bleed  . Microwave thermotherapy prostate  08/28/07    Rogers Blocker  . Polypectomy  11/95  . Esophagogastroduodenoscopy N/A 06/26/2014    Procedure: ESOPHAGOGASTRODUODENOSCOPY (EGD);  Surgeon: Milus Banister, MD;  Location: [place];  Service: Endoscopy;  Laterality: N/A;    Home Medications:    Medication List       This list is accurate as of: 10/14/14  2:18 PM.  Always use your most recent med list.               aspirin 81 MG tablet  Take 81 mg by mouth daily.     atorvastatin 20 MG tablet  Commonly known as:  LIPITOR  TAKE ONE TABLET BY MOUTH EVERY DAY     diphenhydrAMINE 25 mg capsule  Commonly known as:  BENADRYL  Take 1 capsule (25 mg total) by mouth every 4 (four) hours as needed.     finasteride 5 MG tablet  Commonly known as:  PROSCAR     glipiZIDE 10 MG tablet  Commonly known as:  GLUCOTROL  Take 10 mg by mouth daily before breakfast.     insulin detemir 100 UNIT/ML injection  Commonly known as:  LEVEMIR  Inject 20 units at bedtime.     metFORMIN 500 MG tablet  Commonly known as:  GLUCOPHAGE  TAKE TWO TABLETS BY MOUTH TWICE DAILY WITH A MEAL     metoprolol 50 MG  tablet  Commonly known as:  LOPRESSOR  Take 25 mg by mouth 2 (two) times daily.     pantoprazole 40 MG tablet  Commonly known as:  PROTONIX  Take 1 tablet (40 mg total) by mouth daily.     ramipril 10 MG capsule  Commonly known as:  ALTACE  TAKE ONE CAPSULE BY MOUTH EVERY DAY     tamsulosin 0.4 MG Caps capsule  Commonly known as:  FLOMAX  Take 2 capsules (0.8 mg total) by mouth daily.        Allergies: No Known Allergies  Family History: Family History  Problem Relation Age of Onset  . Diabetes Father   . Alcohol abuse Paternal Uncle   . Heart disease Neg Hx   . Stroke Neg Hx   . Cancer Neg Hx   . Drug abuse Neg Hx   . Depression Neg Hx   . Colon cancer Neg Hx   . Rectal cancer Neg Hx   . Stomach cancer Neg Hx   . Alcohol abuse Paternal Uncle   . Alcohol abuse Paternal Uncle   . Rheum arthritis Mother     Social History:  reports that he has been smoking Cigarettes.  He has a 40 pack-year smoking history. He has never used smokeless tobacco. He reports that he drinks alcohol. He reports that he does not use illicit drugs.  ROS: Urological Symptom Review  Patient is experiencing the following symptoms: Frequent urination Get up at night to urinate Blood in urine Weak stream   Review of Systems  Gastrointestinal (upper)  : Vomiting  Gastrointestinal (lower) : Negative for lower GI symptoms  Constitutional : Negative for symptoms  Skin: Negative for skin symptoms  Eyes: Negative for eye symptoms  Ear/Nose/Throat : Negative for Ear/Nose/Throat symptoms  Hematologic/Lymphatic: Negative for Hematologic/Lymphatic symptoms  Cardiovascular : Negative for cardiovascular symptoms  Respiratory : Negative for respiratory symptoms  Endocrine: Negative for endocrine symptoms  Musculoskeletal: Negative for musculoskeletal symptoms  Neurological: Negative for neurological symptoms  Psychologic: Negative for psychiatric symptoms   Physical  Exam: Blood pressure 166/79, pulse 63, height '5\' 6"'$  (1.676 m), weight 161 lb 8 oz (73.256 kg).  GU: Patient with circumcised phallus.  Urethral meatus is patent.  No penile discharge. No penile lesions or rashes. Scrotum without lesions, cysts, rashes and/or edema.  Testicles are located scrotally bilaterally. No masses are appreciated in the testicles. Left and right epididymis are normal. Rectal: Patient with  normal sphincter tone. Perineum without scarring or rashes. No rectal masses are appreciated. Prostate is approximately 60 grams, no nodules are appreciated. Seminal vesicles are normal.   Laboratory Data:  Lab Results  Component Value Date   CREATININE 0.79 07/10/2014    Lab Results  Component Value Date   PSA 1.94 08/08/2011   PSA 1.73 04/24/2011   PSA 1.74 11/19/2006    Pertinent Imaging: Results for Antonio, [name] (MRN [id number]) as of 07/12/2014 19:57  Ref. Range 07/10/2014 10:27  Scan Result Unknown 27   Results for Antonio, [name] (MRN [id number]) as of 10/14/2014 08:40  Ref. Range 10/14/2014 08:33  Scan Result Unknown 38   Assessment & Plan:   1. Urinary retention:   Patient is voiding well on his own at this time.  His retention is brought on by alcohol consumption.  He will try to reduce his alcohol comsumption, so to avoid retention episodes.  He is on maximal medicinal therapy with tamsulosin 0.8 mg and finasteride 5 mg daily.  IPSS score today is 20/3.  His PVR was 38 mL.  PSA is drawn today.         - Bladder Scan (Post Void Residual) in office  2. BPH with LUTS:   IPSS 20/3.  His PVR was 38 mL.  His tamsulosin was refilled today.  He will continue both medications (tamsulosin and finasteride) and RTC in one year for IPSS score, PVR, DRE and PSA.   PSA is drawn today.     Return in about 1 year (around 10/14/2015) for IPSS and PVR.  Zara Council, [facility] [address] [place], [place] [id number] [phone]

## 2014-10-15 ENCOUNTER — Telehealth: Payer: Self-pay

## 2014-10-15 DIAGNOSIS — R972 Elevated prostate specific antigen [PSA]: Secondary | ICD-10-CM

## 2014-10-15 LAB — PSA: Prostate Specific Ag, Serum: 1.9 ng/mL (ref 0.0–4.0)

## 2014-10-15 NOTE — Telephone Encounter (Signed)
Spoke with pt in reference to lab results. Pt voiced understanding. Orders have been placed.

## 2014-10-15 NOTE — Telephone Encounter (Signed)
-----   Message from Nori Riis, PA-C sent at 10/15/2014  8:29 AM EDT ----- Patient's PSA is stable.  We will see him in 12 months.  PSA to be drawn before his next appointment.

## 2014-10-21 ENCOUNTER — Other Ambulatory Visit: Payer: Self-pay

## 2014-10-21 DIAGNOSIS — N400 Enlarged prostate without lower urinary tract symptoms: Secondary | ICD-10-CM

## 2014-10-21 MED ORDER — FINASTERIDE 5 MG PO TABS: 5.0000 mg | ORAL_TABLET | Freq: Every day | ORAL | Status: DC

## 2014-10-21 NOTE — Progress Notes (Signed)
Pt called requesting a refill on finasteride. Refill sent to pt pharmacy. Pt made aware.

## 2014-10-28 NOTE — Telephone Encounter (Signed)
q 

## 2014-11-17 DIAGNOSIS — E1142 Type 2 diabetes mellitus with diabetic polyneuropathy: Secondary | ICD-10-CM | POA: Diagnosis not present

## 2014-11-24 DIAGNOSIS — R809 Proteinuria, unspecified: Secondary | ICD-10-CM | POA: Diagnosis not present

## 2014-11-24 DIAGNOSIS — E119 Type 2 diabetes mellitus without complications: Secondary | ICD-10-CM | POA: Diagnosis not present

## 2014-11-24 DIAGNOSIS — Z794 Long term (current) use of insulin: Secondary | ICD-10-CM | POA: Diagnosis not present

## 2014-11-24 DIAGNOSIS — F17200 Nicotine dependence, unspecified, uncomplicated: Secondary | ICD-10-CM | POA: Diagnosis not present

## 2014-12-18 ENCOUNTER — Other Ambulatory Visit: Payer: Self-pay | Admitting: Family Medicine

## 2014-12-18 NOTE — Telephone Encounter (Signed)
Spoke to pt and informed him OV with labs required for additional refills. #[phone] sent to pharmacy

## 2014-12-23 ENCOUNTER — Other Ambulatory Visit: Payer: Self-pay | Admitting: Family Medicine

## 2014-12-23 ENCOUNTER — Encounter: Payer: Self-pay | Admitting: *Deleted

## 2014-12-23 ENCOUNTER — Ambulatory Visit (INDEPENDENT_AMBULATORY_CARE_PROVIDER_SITE_OTHER): Payer: Medicare Other | Admitting: Family Medicine

## 2014-12-23 ENCOUNTER — Encounter: Payer: Self-pay | Admitting: Family Medicine

## 2014-12-23 VITALS — BP 156/80 | HR 65 | Temp 98.3°F | Wt 166.0 lb

## 2014-12-23 DIAGNOSIS — E785 Hyperlipidemia, unspecified: Secondary | ICD-10-CM

## 2014-12-23 DIAGNOSIS — I10 Essential (primary) hypertension: Secondary | ICD-10-CM | POA: Diagnosis not present

## 2014-12-23 DIAGNOSIS — N401 Enlarged prostate with lower urinary tract symptoms: Secondary | ICD-10-CM

## 2014-12-23 DIAGNOSIS — Z794 Long term (current) use of insulin: Secondary | ICD-10-CM

## 2014-12-23 DIAGNOSIS — E1169 Type 2 diabetes mellitus with other specified complication: Secondary | ICD-10-CM

## 2014-12-23 DIAGNOSIS — N138 Other obstructive and reflux uropathy: Secondary | ICD-10-CM

## 2014-12-23 LAB — MICROALBUMIN / CREATININE URINE RATIO
Creatinine,U: 133.4 mg/dL
Microalb Creat Ratio: 5.1 mg/g (ref 0.0–30.0)
Microalb, Ur: 6.8 mg/dL — ABNORMAL HIGH (ref 0.0–1.9)

## 2014-12-23 LAB — COMPREHENSIVE METABOLIC PANEL
ALT: 11 U/L (ref 0–53)
AST: 13 U/L (ref 0–37)
Albumin: 4.2 g/dL (ref 3.5–5.2)
Alkaline Phosphatase: 56 U/L (ref 39–117)
BUN: 14 mg/dL (ref 6–23)
CO2: 29 mEq/L (ref 19–32)
Calcium: 9.5 mg/dL (ref 8.4–10.5)
Chloride: 104 mEq/L (ref 96–112)
Creatinine, Ser: 0.7 mg/dL (ref 0.40–1.50)
GFR: 118.5 mL/min (ref >60.00)
Glucose, Bld: 142 mg/dL — ABNORMAL HIGH (ref 70–99)
Potassium: 4.2 mEq/L (ref 3.5–5.1)
Sodium: 141 mEq/L (ref 135–145)
Total Bilirubin: 0.5 mg/dL (ref 0.2–1.2)
Total Protein: 6.9 g/dL (ref 6.0–8.3)

## 2014-12-23 LAB — LIPID PANEL
Cholesterol: 114 mg/dL (ref 0–200)
HDL: 40.2 mg/dL (ref >39.00)
LDL Cholesterol: 54 mg/dL (ref 0–99)
NonHDL: 73.8
Total CHOL/HDL Ratio: 3
Triglycerides: 97 mg/dL (ref 0.0–149.0)
VLDL: 19.4 mg/dL (ref 0.0–40.0)

## 2014-12-23 LAB — HEMOGLOBIN A1C: HEMOGLOBIN A1C: 7.1 % — AB (ref 4.6–6.5)

## 2014-12-23 NOTE — Progress Notes (Signed)
Subjective:    Patient ID: Antonio [name], male    DOB: 1945-11-13, 69 y.o.   MRN: [id number]  HPI  69 yo pleasant male here for follow up.   DM- followed by Dr. Nilda Simmer.  Last saw her 11/2014 per pt   a1c has improved.  Has eye exam scheduled for 01/18/2015.    Checking FSBSs every other day.  Was 120 this am.  Taking Levemir 20 units qhs, Metformin 500 mg twice daily,and added glipizide 5 mg at bedtime. Lab Results  Component Value Date   HGBA1C 7.9* 10/22/2013    Denies any episodes of hypoglycemia. On ACEI.  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  Denies any current suprapubic pressure or dysuria.  Wt Readings from Last 3 Encounters:  12/23/14 166 lb (75.297 kg)  10/14/14 161 lb 8 oz (73.256 kg)  08/05/14 161 lb (73.029 kg)    HTN- has been stable on Metoprolol.  Denies any HA, blurred vision, CP or LE edema. Lab Results  Component Value Date   CREATININE 0.79 07/10/2014    HLD- on fenofibrate and Atorvastatin. Denies any myalgias.  Lab Results  Component Value Date   CHOL 120 10/22/2013   HDL 37.20* 10/22/2013   LDLCALC 59 10/22/2013   LDLDIRECT 120.3 04/24/2011   TRIG 121.0 10/22/2013   CHOLHDL 3 10/22/2013     Patient Active Problem List   Diagnosis Date Noted  . BPH with obstruction/lower urinary tract symptoms 10/14/2014  . Urinary retention 07/12/2014  . Hypertension 06/26/2014  . Dysphagia 06/26/2014  . Hypertension, uncontrolled 06/26/2014  . Diabetes mellitus type 2, controlled ([place]) 06/26/2014  . Hyperlipidemia 06/26/2014  . Essential hypertension   . Pain in the chest 06/25/2014  . Other dysphagia 12/04/2012  . Bladder outlet obstruction 08/10/2011  . Weight loss, abnormal 04/24/2011  . ESOPHAGEAL STRICTURE 12/26/2006  . GASTRITIS 12/26/2006  . HYPERTENSION, BENIGN ESSENTIAL 11/19/2006  . PSA, INCREASED 11/19/2006  . Diabetes ([place]) 06/27/2006  . HLD (hyperlipidemia) 06/27/2006  . CORONARY ARTERY DISEASE 06/27/2006   . HEMORRHOIDS, INTERNAL 06/27/2006  . LOW BACK PAIN, CHRONIC 06/27/2006  . TOBACCO ABUSE, HX OF 06/27/2006   Past Medical History  Diagnosis Date  . Hypertension   . Hyperlipidemia   . Diabetes mellitus     Type II  . Gastritis   . Esophageal stricture   . URI (upper respiratory infection)   . PSA elevation     now averaging 2's  . Internal hemorrhoids without mention of complication   . Lumbago   . Tobacco abuse   . Coronary atherosclerosis of unspecified type of vessel, native or graft   . Hx of colonic polyps   . BPH with urinary obstruction     stable on flomax ([name]  . Hiatal hernia   . Urinary retention    Past Surgical History  Procedure Laterality Date  . Knee arthroscopy  10/00    Right  . Stress myoview  07/06    EF 67%  . 2d echo  06/03  . Ct of head  and sinuses  06/03    Negative  . Lp  06/03  . Abdominal ultrasound  06/03    Negative  . Ct of the chest, abdomen, pelvis  06/03    Chest negative; Abdomen, left adrenal lesion; Pelvis, enlarged prostate  . Persantine cardiolite  03/01    EF 68%  . Esophagogastroduodenoscopy  02/13/07    gastritis and duodenitis without bleed  .  Microwave thermotherapy prostate  08/28/07    Rogers Blocker  . Polypectomy  11/95  . Esophagogastroduodenoscopy N/A 06/26/2014    Procedure: ESOPHAGOGASTRODUODENOSCOPY (EGD);  Surgeon: Milus Banister, MD;  Location: [facility];  Service: Endoscopy;  Laterality: N/A;   Social History  Substance Use Topics  . Smoking status: Current Every Day Smoker -- 1.00 packs/day for 40 years    Types: Cigarettes  . Smokeless tobacco: Never Used  . Alcohol Use: 0.0 oz/week    0 Standard drinks or equivalent per week     Comment: rarely   Family History  Problem Relation Age of Onset  . Diabetes Father   . Alcohol abuse Paternal Uncle   . Heart disease Neg Hx   . Stroke Neg Hx   . Cancer Neg Hx   . Drug abuse Neg Hx   . Depression Neg Hx   . Colon cancer Neg Hx   . Rectal cancer Neg  Hx   . Stomach cancer Neg Hx   . Alcohol abuse Paternal Uncle   . Alcohol abuse Paternal Uncle   . Rheum arthritis Mother    No Known Allergies Current Outpatient Prescriptions on File Prior to Visit  Medication Sig Dispense Refill  . aspirin 81 MG tablet Take 81 mg by mouth daily.    [name] atorvastatin (LIPITOR) 20 MG tablet TAKE ONE TABLET BY MOUTH ONCE DAILY *OFFICE VISIT WITH LABS REQUIRED FOR ADDITIONAL REFILLS* 30 tablet 0  . finasteride (PROSCAR) 5 MG tablet Take 1 tablet (5 mg total) by mouth daily. 30 tablet 3  . glipiZIDE (GLUCOTROL) 10 MG tablet Take 10 mg by mouth daily before breakfast.     . insulin detemir (LEVEMIR) 100 UNIT/ML injection Inject 20 units at bedtime. 10 pen 5  . metFORMIN (GLUCOPHAGE) 500 MG tablet TAKE TWO TABLETS BY MOUTH TWICE DAILY WITH A MEAL 120 tablet 0  . metoprolol (LOPRESSOR) 50 MG tablet Take 25 mg by mouth 2 (two) times daily.    . pantoprazole (PROTONIX) 40 MG tablet Take 1 tablet (40 mg total) by mouth daily. 30 tablet 2  . ramipril (ALTACE) 10 MG capsule TAKE ONE CAPSULE BY MOUTH EVERY DAY 30 capsule 8  . tamsulosin (FLOMAX) 0.4 MG CAPS capsule Take 2 capsules (0.8 mg total) by mouth daily. 90 capsule 3   No current facility-administered medications on file prior to visit.   The PMH, PSH, Social History, Family History, Medications, and allergies have been reviewed in [facility], and have been updated if relevant.  Review of Systems  Constitutional: Negative.   Eyes: Negative.   Respiratory: Negative.   Cardiovascular: Negative.   Gastrointestinal: Negative.   Genitourinary: Negative.   Musculoskeletal: Negative.   Skin: Negative.   Allergic/Immunologic: Negative.   Neurological: Negative.   Hematological: Negative.   Psychiatric/Behavioral: Negative.   All other systems reviewed and are negative.   Objective:   Physical Exam BP 156/80 mmHg  Pulse 65  Temp(Src) 98.3 F (36.8 C) (Tympanic)  Wt 166 lb (75.297 kg)  SpO2 98% Wt Readings  from Last 3 Encounters:  12/23/14 166 lb (75.297 kg)  10/14/14 161 lb 8 oz (73.256 kg)  08/05/14 161 lb (73.029 kg)  General:  Pleasant male in NAD Eyes:  PERRL Ears:  External ear exam shows no significant lesions or deformities.  Otoscopic examination reveals clear canals, tympanic membranes are intact bilaterally without bulging, retraction, inflammation or discharge. Hearing is grossly normal bilaterally. Nose:  External nasal examination shows no deformity or inflammation. Nasal  mucosa are pink and moist without lesions or exudates. Mouth:  Oral mucosa and oropharynx without lesions or exudates.  Teeth in good repair. Neck:  no carotid bruit or thyromegaly no cervical or supraclavicular lymphadenopathy  Lungs:  Normal respiratory effort, chest expands symmetrically. Lungs are clear to auscultation, no crackles or wheezes. Heart:  Normal rate and regular rhythm. S1 and S2 normal without gallop, murmur, click, rub or other extra sounds. Abdomen:  Bowel sounds positive,abdomen soft and non-tender without masses, organomegaly or hernias noted. Extremities:  no edema     Assessment & Plan:

## 2014-12-23 NOTE — Patient Instructions (Signed)
Great to see you. We will call you with your lab results.  Happy Birthday!

## 2014-12-23 NOTE — Progress Notes (Signed)
Pre visit review using our clinic review tool, if applicable. No additional management support is needed unless otherwise documented below in the visit note. 

## 2014-12-23 NOTE — Assessment & Plan Note (Signed)
Followed by endo. No changes made to rxs. He would like for me to check labs here and forward them to Dr. Gabriel Carina. Declines pneumococcal vaccination.

## 2014-12-23 NOTE — Assessment & Plan Note (Signed)
Followed by urology.  Still self caths a few times a week but he is tolerating this ok.

## 2014-12-23 NOTE — Assessment & Plan Note (Signed)
Due for labs today. 

## 2014-12-23 NOTE — Assessment & Plan Note (Signed)
A little elevated today. Has not yet taken rx for the day.

## 2015-01-18 DIAGNOSIS — E119 Type 2 diabetes mellitus without complications: Secondary | ICD-10-CM | POA: Diagnosis not present

## 2015-01-18 LAB — HM DIABETES EYE EXAM

## 2015-01-19 ENCOUNTER — Other Ambulatory Visit: Payer: Self-pay | Admitting: Family Medicine

## 2015-02-04 ENCOUNTER — Encounter: Payer: Self-pay | Admitting: *Deleted

## 2015-03-20 ENCOUNTER — Other Ambulatory Visit: Payer: Self-pay | Admitting: Urology

## 2015-03-20 DIAGNOSIS — N400 Enlarged prostate without lower urinary tract symptoms: Secondary | ICD-10-CM

## 2015-04-17 ENCOUNTER — Other Ambulatory Visit: Payer: Self-pay | Admitting: Urology

## 2015-04-20 ENCOUNTER — Encounter: Payer: Self-pay | Admitting: Family Medicine

## 2015-04-20 ENCOUNTER — Ambulatory Visit (INDEPENDENT_AMBULATORY_CARE_PROVIDER_SITE_OTHER): Payer: Medicare Other | Admitting: Family Medicine

## 2015-04-20 VITALS — BP 130/78 | HR 41 | Temp 97.6°F | Ht 65.75 in | Wt 163.8 lb

## 2015-04-20 DIAGNOSIS — Z794 Long term (current) use of insulin: Secondary | ICD-10-CM | POA: Diagnosis not present

## 2015-04-20 DIAGNOSIS — Z0000 Encounter for general adult medical examination without abnormal findings: Secondary | ICD-10-CM | POA: Diagnosis not present

## 2015-04-20 DIAGNOSIS — E785 Hyperlipidemia, unspecified: Secondary | ICD-10-CM

## 2015-04-20 DIAGNOSIS — I2510 Atherosclerotic heart disease of native coronary artery without angina pectoris: Secondary | ICD-10-CM

## 2015-04-20 DIAGNOSIS — E11618 Type 2 diabetes mellitus with other diabetic arthropathy: Secondary | ICD-10-CM

## 2015-04-20 DIAGNOSIS — N401 Enlarged prostate with lower urinary tract symptoms: Secondary | ICD-10-CM | POA: Diagnosis not present

## 2015-04-20 DIAGNOSIS — E1169 Type 2 diabetes mellitus with other specified complication: Secondary | ICD-10-CM | POA: Diagnosis not present

## 2015-04-20 DIAGNOSIS — N138 Other obstructive and reflux uropathy: Secondary | ICD-10-CM

## 2015-04-20 DIAGNOSIS — I10 Essential (primary) hypertension: Secondary | ICD-10-CM

## 2015-04-20 DIAGNOSIS — Z87891 Personal history of nicotine dependence: Secondary | ICD-10-CM

## 2015-04-20 LAB — URINALYSIS, MICROSCOPIC ONLY

## 2015-04-20 LAB — HEMOGLOBIN A1C: HEMOGLOBIN A1C: 7.6 % — AB (ref 4.6–6.5)

## 2015-04-20 LAB — PSA, MEDICARE: PSA: 0.88 ng/ml (ref 0.10–4.00)

## 2015-04-20 NOTE — Progress Notes (Signed)
Pre visit review using our clinic review tool, if applicable. No additional management support is needed unless otherwise documented below in the visit note. 

## 2015-04-20 NOTE — Assessment & Plan Note (Signed)
Well controlled. No changes made to rxs. 

## 2015-04-20 NOTE — Assessment & Plan Note (Signed)
Self caths when he has retention at night. Followed by urology

## 2015-04-20 NOTE — Assessment & Plan Note (Signed)
Followed by Dr. Gabriel Carina. Has appt next. Check a1c today. On ACEI and Stain.

## 2015-04-20 NOTE — Assessment & Plan Note (Signed)
The patients weight, height, BMI and visual acuity have been recorded in the chart.  Cognitive function assessed.   I have made referrals, counseling and provided education to the patient based review of the above and I have provided the pt with a written personalized care plan for preventive services.  Declines immunizations.

## 2015-04-20 NOTE — Assessment & Plan Note (Signed)
At goal for diabetic. No changes made.

## 2015-04-20 NOTE — Progress Notes (Signed)
Subjective:    Patient ID: Antonio [name], male    DOB: Jun 16, 1945, 70 y.o.   MRN: [id number]  HPI  70 yo pleasant male here annual medicare wellness visit and for follow up of chronic medical conditions.  I have personally reviewed the Medicare Annual Wellness questionnaire and have noted 1. The patient's medical and social history 2. Their use of alcohol, tobacco or illicit drugs 3. Their current medications and supplements 4. The patient's functional ability including ADL's, fall risks, home safety risks and hearing or visual             impairment. 5. Diet and physical activities 6. Evidence for depression or mood disorders  End of life wishes discussed and updated in Social History.  The roster of all physicians providing medical care to patient - is listed in the CareTeams section of the chart.  Td 11/19/06 Eye exam 01/18/15 Colonoscopy 08/05/14   DM- followed by Dr. Nilda Simmer.  Scheduled to see her next month.  Checking FSBSs every other day.  Was 140 this am but had a snack in the middle of the night.  Taking Levemir 20 units qhs, Metformin 500 mg twice daily,and added glipizide 5 mg at bedtime. Lab Results  Component Value Date   HGBA1C 7.1* 12/23/2014    Denies any episodes of hypoglycemia. On ACEI.  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  Denies any current suprapubic pressure or dysuria.  Wt Readings from Last 3 Encounters:  04/20/15 163 lb 12 oz (74.277 kg)  12/23/14 166 lb (75.297 kg)  10/14/14 161 lb 8 oz (73.256 kg)    HTN- has been stable on Metoprolol.  Denies any HA, blurred vision, CP or LE edema. Lab Results  Component Value Date   CREATININE 0.70 12/23/2014    HLD- on fenofibrate and Atorvastatin. Denies any myalgias.  Lab Results  Component Value Date   CHOL 114 12/23/2014   HDL 40.20 12/23/2014   LDLCALC 54 12/23/2014   LDLDIRECT 120.3 04/24/2011   TRIG 97.0 12/23/2014   CHOLHDL 3 12/23/2014     Patient Active  Problem List   Diagnosis Date Noted  . Medicare annual wellness visit, subsequent 04/20/2015  . BPH with obstruction/lower urinary tract symptoms 10/14/2014  . Dysphagia 06/26/2014  . Hypertension, uncontrolled 06/26/2014  . Diabetes mellitus type 2, controlled ([place]) 06/26/2014  . Essential hypertension   . Other dysphagia 12/04/2012  . Bladder outlet obstruction 08/10/2011  . ESOPHAGEAL STRICTURE 12/26/2006  . GASTRITIS 12/26/2006  . PSA, INCREASED 11/19/2006  . Diabetes ([place]) 06/27/2006  . HLD (hyperlipidemia) 06/27/2006  . Coronary atherosclerosis 06/27/2006  . HEMORRHOIDS, INTERNAL 06/27/2006  . TOBACCO ABUSE, HX OF 06/27/2006   Past Medical History  Diagnosis Date  . Hypertension   . Hyperlipidemia   . Diabetes mellitus     Type II  . Gastritis   . Esophageal stricture   . URI (upper respiratory infection)   . PSA elevation     now averaging 2's  . Internal hemorrhoids without mention of complication   . Lumbago   . Tobacco abuse   . Coronary atherosclerosis of unspecified type of vessel, native or graft   . Hx of colonic polyps   . BPH with urinary obstruction     stable on flomax ([name]  . Hiatal hernia   . Urinary retention    Past Surgical History  Procedure Laterality Date  . Knee arthroscopy  10/00    Right  .  Stress myoview  07/06    EF 67%  . 2d echo  06/03  . Ct of head  and sinuses  06/03    Negative  . Lp  06/03  . Abdominal ultrasound  06/03    Negative  . Ct of the chest, abdomen, pelvis  06/03    Chest negative; Abdomen, left adrenal lesion; Pelvis, enlarged prostate  . Persantine cardiolite  03/01    EF 68%  . Esophagogastroduodenoscopy  02/13/07    gastritis and duodenitis without bleed  . Microwave thermotherapy prostate  08/28/07    Rogers Blocker  . Polypectomy  11/95  . Esophagogastroduodenoscopy N/A 06/26/2014    Procedure: ESOPHAGOGASTRODUODENOSCOPY (EGD);  Surgeon: Milus Banister, MD;  Location: [facility];  Service: Endoscopy;   Laterality: N/A;   Social History  Substance Use Topics  . Smoking status: Current Every Day Smoker -- 1.00 packs/day for 40 years    Types: Cigarettes  . Smokeless tobacco: Never Used  . Alcohol Use: 0.0 oz/week    0 Standard drinks or equivalent per week     Comment: rarely   Family History  Problem Relation Age of Onset  . Diabetes Father   . Alcohol abuse Paternal Uncle   . Heart disease Neg Hx   . Stroke Neg Hx   . Cancer Neg Hx   . Drug abuse Neg Hx   . Depression Neg Hx   . Colon cancer Neg Hx   . Rectal cancer Neg Hx   . Stomach cancer Neg Hx   . Alcohol abuse Paternal Uncle   . Alcohol abuse Paternal Uncle   . Rheum arthritis Mother    No Known Allergies Current Outpatient Prescriptions on File Prior to Visit  Medication Sig Dispense Refill  . aspirin 81 MG tablet Take 81 mg by mouth daily.    [name] atorvastatin (LIPITOR) 20 MG tablet Take 1 tablet (20 mg total) by mouth daily. 30 tablet 10  . finasteride (PROSCAR) 5 MG tablet TAKE ONE TABLET BY MOUTH ONCE DAILY 30 tablet 0  . glipiZIDE (GLUCOTROL) 10 MG tablet Take 10 mg by mouth daily before breakfast.     . insulin detemir (LEVEMIR) 100 UNIT/ML injection Inject 20 units at bedtime. 10 pen 5  . metFORMIN (GLUCOPHAGE) 500 MG tablet TAKE TWO TABLETS BY MOUTH TWICE DAILY WITH A MEAL 120 tablet 0  . metoprolol (LOPRESSOR) 50 MG tablet TAKE ONE-HALF TABLET BY MOUTH TWICE DAILY 30 tablet 10  . pantoprazole (PROTONIX) 40 MG tablet Take 1 tablet (40 mg total) by mouth daily. 30 tablet 2  . ramipril (ALTACE) 10 MG capsule TAKE ONE CAPSULE BY MOUTH ONCE DAILY 30 capsule 10  . tamsulosin (FLOMAX) 0.4 MG CAPS capsule Take 2 capsules (0.8 mg total) by mouth daily. 90 capsule 3   No current facility-administered medications on file prior to visit.   The PMH, PSH, Social History, Family History, Medications, and allergies have been reviewed in [facility], and have been updated if relevant.  Review of Systems  Constitutional: Negative.    Eyes: Negative.   Respiratory: Negative.   Cardiovascular: Negative.   Gastrointestinal: Negative.   Genitourinary: Negative.   Musculoskeletal: Negative.   Skin: Negative.   Allergic/Immunologic: Negative.   Neurological: Negative.   Hematological: Negative.   Psychiatric/Behavioral: Negative.   All other systems reviewed and are negative.   Objective:   Physical Exam BP 130/78 mmHg  Pulse 41  Temp(Src) 97.6 F (36.4 C) (Oral)  Ht 5' 5.75" (1.67 m)  Wt 163 lb 12 oz (74.277 kg)  BMI 26.63 kg/m2  SpO2 95% Wt Readings from Last 3 Encounters:  04/20/15 163 lb 12 oz (74.277 kg)  12/23/14 166 lb (75.297 kg)  10/14/14 161 lb 8 oz (73.256 kg)  General:  Pleasant male in NAD Eyes:  PERRL Ears:  External ear exam shows no significant lesions or deformities.  Otoscopic examination reveals clear canals, tympanic membranes are intact bilaterally without bulging, retraction, inflammation or discharge. Hearing is grossly normal bilaterally. Nose:  External nasal examination shows no deformity or inflammation. Nasal mucosa are pink and moist without lesions or exudates. Mouth:  Oral mucosa and oropharynx without lesions or exudates.  Teeth in good repair. Neck:  no carotid bruit or thyromegaly no cervical or supraclavicular lymphadenopathy  Lungs:  Normal respiratory effort, chest expands symmetrically. Lungs are clear to auscultation, no crackles or wheezes. Heart:  Normal rate and regular rhythm. S1 and S2 normal without gallop, murmur, click, rub or other extra sounds. Abdomen:  Bowel sounds positive,abdomen soft and non-tender without masses, organomegaly or hernias noted. Extremities:  no edema     Assessment & Plan:

## 2015-04-21 ENCOUNTER — Other Ambulatory Visit: Payer: Self-pay | Admitting: *Deleted

## 2015-04-21 NOTE — Telephone Encounter (Signed)
Opened in error

## 2015-05-17 ENCOUNTER — Other Ambulatory Visit: Payer: Self-pay | Admitting: Urology

## 2015-05-17 DIAGNOSIS — N400 Enlarged prostate without lower urinary tract symptoms: Secondary | ICD-10-CM

## 2015-05-19 DIAGNOSIS — E119 Type 2 diabetes mellitus without complications: Secondary | ICD-10-CM | POA: Diagnosis not present

## 2015-05-19 DIAGNOSIS — Z794 Long term (current) use of insulin: Secondary | ICD-10-CM | POA: Diagnosis not present

## 2015-05-26 DIAGNOSIS — F17200 Nicotine dependence, unspecified, uncomplicated: Secondary | ICD-10-CM | POA: Diagnosis not present

## 2015-05-26 DIAGNOSIS — E1129 Type 2 diabetes mellitus with other diabetic kidney complication: Secondary | ICD-10-CM | POA: Diagnosis not present

## 2015-05-26 DIAGNOSIS — Z794 Long term (current) use of insulin: Secondary | ICD-10-CM | POA: Diagnosis not present

## 2015-05-26 DIAGNOSIS — I10 Essential (primary) hypertension: Secondary | ICD-10-CM | POA: Diagnosis not present

## 2015-05-26 DIAGNOSIS — R809 Proteinuria, unspecified: Secondary | ICD-10-CM | POA: Diagnosis not present

## 2015-06-21 ENCOUNTER — Other Ambulatory Visit: Payer: Self-pay | Admitting: Urology

## 2015-06-25 ENCOUNTER — Other Ambulatory Visit: Payer: Self-pay | Admitting: Urology

## 2015-07-19 ENCOUNTER — Other Ambulatory Visit: Payer: Self-pay | Admitting: Urology

## 2015-08-18 ENCOUNTER — Other Ambulatory Visit: Payer: Self-pay | Admitting: Urology

## 2015-08-18 ENCOUNTER — Other Ambulatory Visit: Payer: Self-pay | Admitting: Physician Assistant

## 2015-08-18 DIAGNOSIS — N400 Enlarged prostate without lower urinary tract symptoms: Secondary | ICD-10-CM

## 2015-09-17 ENCOUNTER — Other Ambulatory Visit: Payer: Self-pay | Admitting: Urology

## 2015-09-17 DIAGNOSIS — N400 Enlarged prostate without lower urinary tract symptoms: Secondary | ICD-10-CM

## 2015-09-30 ENCOUNTER — Encounter: Payer: Self-pay | Admitting: Family Medicine

## 2015-09-30 ENCOUNTER — Ambulatory Visit (INDEPENDENT_AMBULATORY_CARE_PROVIDER_SITE_OTHER): Payer: Medicare Other | Admitting: Family Medicine

## 2015-09-30 DIAGNOSIS — H6191 Disorder of right external ear, unspecified: Secondary | ICD-10-CM | POA: Diagnosis not present

## 2015-09-30 DIAGNOSIS — I2510 Atherosclerotic heart disease of native coronary artery without angina pectoris: Secondary | ICD-10-CM | POA: Diagnosis not present

## 2015-09-30 NOTE — Patient Instructions (Signed)
Good to see you. Please stop by to see Rosaria Ferries or Ebony Hail on your way out.

## 2015-09-30 NOTE — Progress Notes (Signed)
Pre visit review using our clinic review tool, if applicable. No additional management support is needed unless otherwise documented below in the visit note. 

## 2015-09-30 NOTE — Progress Notes (Signed)
Subjective:   Patient ID: Antonio [name], male    DOB: 30-Dec-1945, 70 y.o.   MRN: [id number]  Antonio [name] is a pleasant 70 y.o. year old male who presents to clinic today with Ear Pain (lesion on ear)  on 09/30/2015  HPI:  Ear lesion on his right ear- growing in size, bleeds intermittently. Noticed it a few months ago.  Has applied neosporin to it without improvement.  Does not have a dermatologist.   Current Outpatient Prescriptions on File Prior to Visit  Medication Sig Dispense Refill  . aspirin 81 MG tablet Take 81 mg by mouth daily.    [name] atorvastatin (LIPITOR) 20 MG tablet Take 1 tablet (20 mg total) by mouth daily. 30 tablet 10  . finasteride (PROSCAR) 5 MG tablet TAKE ONE TABLET BY MOUTH ONCE DAILY 30 tablet 0  . finasteride (PROSCAR) 5 MG tablet TAKE ONE TABLET BY MOUTH ONCE DAILY 30 tablet 0  . glipiZIDE (GLUCOTROL) 10 MG tablet Take 10 mg by mouth daily before breakfast.     . insulin detemir (LEVEMIR) 100 UNIT/ML injection Inject 20 units at bedtime. 10 pen 5  . metFORMIN (GLUCOPHAGE) 500 MG tablet TAKE TWO TABLETS BY MOUTH TWICE DAILY WITH A MEAL 120 tablet 0  . metoprolol (LOPRESSOR) 50 MG tablet TAKE ONE-HALF TABLET BY MOUTH TWICE DAILY 30 tablet 10  . pantoprazole (PROTONIX) 40 MG tablet Take 1 tablet (40 mg total) by mouth daily. 30 tablet 2  . ramipril (ALTACE) 10 MG capsule TAKE ONE CAPSULE BY MOUTH ONCE DAILY 30 capsule 10  . tamsulosin (FLOMAX) 0.4 MG CAPS capsule TAKE TWO CAPSULES BY MOUTH ONCE DAILY 90 capsule 0   No current facility-administered medications on file prior to visit.     No Known Allergies  Past Medical History:  Diagnosis Date  . 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 Coronary atherosclerosis of unspecified type of vessel, native or graft   . Diabetes mellitus    Type II  . Esophageal stricture   . Gastritis   . Hiatal hernia   . Hx of colonic polyps   . Hyperlipidemia   . Hypertension   . Internal  hemorrhoids without mention of complication   . Lumbago   . PSA elevation    now averaging 2's  . Tobacco abuse   . URI (upper respiratory infection)   . Urinary retention     Past Surgical History:  Procedure Laterality Date  . 2D Echo  06/03  . Abdominal ultrasound  06/03   Negative  . CT of head  and sinuses  06/03   Negative  . CT of the chest, abdomen, pelvis  06/03   Chest negative; Abdomen, left adrenal lesion; Pelvis, enlarged prostate  . ESOPHAGOGASTRODUODENOSCOPY  02/13/07   gastritis and duodenitis without bleed  . ESOPHAGOGASTRODUODENOSCOPY N/A 06/26/2014   Procedure: ESOPHAGOGASTRODUODENOSCOPY (EGD);  Surgeon: Milus Banister, MD;  Location: [facility];  Service: Endoscopy;  Laterality: N/A;  . KNEE ARTHROSCOPY  10/00   Right  . LP  06/03  . Microwave thermotherapy prostate  08/28/07   Rogers Blocker  . Persantine cardiolite  03/01   EF 68%  . POLYPECTOMY  11/95  . Stress myoview  07/06   EF 67%    Family History  Problem Relation Age of Onset  . Diabetes Father   . Alcohol abuse Paternal Uncle   . Heart disease Neg Hx   . Stroke Neg Hx   .  Cancer Neg Hx   . Drug abuse Neg Hx   . Depression Neg Hx   . Colon cancer Neg Hx   . Rectal cancer Neg Hx   . Stomach cancer Neg Hx   . Alcohol abuse Paternal Uncle   . Alcohol abuse Paternal Uncle   . Rheum arthritis Mother     Social History   Social History  . Marital status: Married    Spouse name: [name]  . Number of children: 3  . Years of education: N/A   Occupational History  . Retired Games developer    Social History Main Topics  . Smoking status: Current Every Day Smoker    Packs/day: 1.00    Years: 40.00    Types: Cigarettes  . Smokeless tobacco: Never Used  . Alcohol use 0.0 oz/week     Comment: rarely  . Drug use: No  . Sexual activity: Not on file   Other Topics Concern  . Not on file   Social History Narrative   Married 9/09 wife with moderate memory problems.   3 adult children, 2  grandchildren   Would desire CPR   The PMH, PSH, Social History, Family History, Medications, and allergies have been reviewed in [facility], and have been updated if relevant.   Review of Systems  Constitutional: Negative.   Skin: Positive for wound.  All other systems reviewed and are negative.      Objective:    BP 136/62   Pulse (!) 58   Temp 97.7 F (36.5 C) (Oral)   Wt 168 lb 4 oz (76.3 kg)   SpO2 98%   BMI 27.36 kg/m    Physical Exam  Constitutional: He is oriented to person, place, and time. He appears well-developed and well-nourished. No distress.  HENT:  Head: Normocephalic.  Eyes: Conjunctivae are normal.  Cardiovascular: Normal rate.   Pulmonary/Chest: Effort normal.  Musculoskeletal: Normal range of motion.  Neurological: He is alert and oriented to person, place, and time. No cranial nerve deficit.  Skin:     Psychiatric: He has a normal mood and affect. His behavior is normal. Judgment and thought content normal.  Nursing note and vitals reviewed.         Assessment & Plan:   Lesion of right external ear - Plan: Ambulatory referral to Dermatology No Follow-up on file.

## 2015-09-30 NOTE — Assessment & Plan Note (Signed)
New- refer urgently to dermatology. Concern for malignancy- SCC vs BCC. The patient indicates understanding of these issues and agrees with the plan.

## 2015-10-01 DIAGNOSIS — D485 Neoplasm of uncertain behavior of skin: Secondary | ICD-10-CM | POA: Diagnosis not present

## 2015-10-01 DIAGNOSIS — C44212 Basal cell carcinoma of skin of right ear and external auricular canal: Secondary | ICD-10-CM | POA: Diagnosis not present

## 2015-10-01 DIAGNOSIS — L570 Actinic keratosis: Secondary | ICD-10-CM | POA: Diagnosis not present

## 2015-10-12 ENCOUNTER — Other Ambulatory Visit: Payer: Medicare Other

## 2015-10-12 DIAGNOSIS — R972 Elevated prostate specific antigen [PSA]: Secondary | ICD-10-CM | POA: Diagnosis not present

## 2015-10-13 LAB — PSA: PROSTATE SPECIFIC AG, SERUM: 0.8 ng/mL (ref 0.0–4.0)

## 2015-10-14 ENCOUNTER — Ambulatory Visit (INDEPENDENT_AMBULATORY_CARE_PROVIDER_SITE_OTHER): Payer: Medicare Other | Admitting: Urology

## 2015-10-14 ENCOUNTER — Encounter: Payer: Self-pay | Admitting: Urology

## 2015-10-14 VITALS — BP 176/67 | HR 61 | Ht 67.0 in | Wt 166.6 lb

## 2015-10-14 DIAGNOSIS — N401 Enlarged prostate with lower urinary tract symptoms: Secondary | ICD-10-CM

## 2015-10-14 DIAGNOSIS — R339 Retention of urine, unspecified: Secondary | ICD-10-CM | POA: Diagnosis not present

## 2015-10-14 DIAGNOSIS — I2510 Atherosclerotic heart disease of native coronary artery without angina pectoris: Secondary | ICD-10-CM | POA: Diagnosis not present

## 2015-10-14 DIAGNOSIS — N138 Other obstructive and reflux uropathy: Secondary | ICD-10-CM

## 2015-10-14 LAB — BLADDER SCAN AMB NON-IMAGING: Scan Result: 38

## 2015-10-14 MED ORDER — TAMSULOSIN HCL 0.4 MG PO CAPS: ORAL_CAPSULE | ORAL | 4 refills | Status: DC

## 2015-10-14 MED ORDER — FINASTERIDE 5 MG PO TABS: 5.0000 mg | ORAL_TABLET | Freq: Every day | ORAL | 4 refills | Status: DC

## 2015-10-14 NOTE — Progress Notes (Signed)
8:49 AM   Antonio [name] 1945/11/30 [id number]  Referring provider: Lucille Passy, MD [address] [place], [place] [address]  Chief Complaint  Patient presents with  . Benign Prostatic Hypertrophy    1 year follow up  . Urinary Retention    HPI: Patient is a 70 year old Caucasian male with a history of urinary retention when he consumes alcohol and has been managing his retention issues with CIC when it occurs and BPH with LUTS who presents today for a one year follow up.   Previous history:  Patient was seen in the emergency room [facility], 06/20/2014) for urinary retention. He presented to our office 4 days later for a voiding trial. He passed a fill and spill performed in the office. He was started on finasteride and his Flomax was increased to 2 capsules daily.  Sometime later, he went into retention again and another Foley catheter was placed. He presented to our office on 07/09/2014 for Foley catheter removal. Foley catheter was removed that morning. He returned  in the afternoon in urinary retention. He was instructed on CIC and he returns today to check on his progress.   Background story:  Patient has a long history of BPH. Had a Microwave procedure 8 yrs ago by Dr. Yves Dill. Did well for 5 yrs or so, had an episode of urinary retention again in 2014 and had a catheter temporarily at that point. He then developed urinary retention again last month. He was sent to [facility] for follow-up and had his catheter removed. He followed up 24 hours later and PVR was checked, and ok. He then developed retention again several days later and was seen in the [facility] ED.   Urinary retention His baseline urinary symptoms are frequency, urgency, nocturia, intermittency and weak urinary stream. He is on maximum medical therapy with tamsulosin 0.8 mg daily and finasteride 5 mg daily.  His urinary symptoms seemed to worsen when he consumes alcohol and he is comfortable with CIC.  His PVR was 38 mL  today.  BPH WITH LUTS His IPSS score today is 17, which is severe lower urinary tract symptomatology. He is mostly satisfied with his quality life due to his urinary symptoms.  His PVR is 38 mL.   His previous IPSS score was 20/3.  His previous PVR is 38 mL.  His major complaint today urinary retention when he drinks alcohol, frequency, nocturia x 2 and hesitancy.    He has had these symptoms for three years.  He denies any dysuria, hematuria or suprapubic pain.  He currently taking tamsulosin 0.8 mg daily and finasteride 5 mg daily.   His has had TARGIS approximately 8 years ago.  He also denies any recent fevers, chills, nausea or vomiting.  He does not have a family history of PCa.      IPSS    Row Name 10/14/15 0800         International Prostate Symptom Score   How often have you had the sensation of not emptying your bladder? About half the time     How often have you had to urinate less than every two hours? About half the time     How often have you found you stopped and started again several times when you urinated? Less than half the time     How often have you found it difficult to postpone urination? Less than half the time     How often have you had  a weak urinary stream? About half the time     How often have you had to strain to start urination? Less than half the time     How many times did you typically get up at night to urinate? 2 Times     Total IPSS Score 17       Quality of Life due to urinary symptoms   If you were to spend the rest of your life with your urinary condition just the way it is now how would you feel about that? Mostly Satisfied        Score:  1-7 Mild 8-19 Moderate 20-35 Severe    PMH: Past Medical History:  Diagnosis Date  . 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 Coronary atherosclerosis of unspecified type of vessel, native or graft   . Diabetes mellitus    Type II  . Esophageal stricture   . Gastritis   .  Hiatal hernia   . Hx of colonic polyps   . Hyperlipidemia   . Hypertension   . Internal hemorrhoids without mention of complication   . Lumbago   . PSA elevation    now averaging 2's  . Tobacco abuse   . URI (upper respiratory infection)   . Urinary retention     Surgical History: Past Surgical History:  Procedure Laterality Date  . 2D Echo  06/03  . Abdominal ultrasound  06/03   Negative  . CT of head  and sinuses  06/03   Negative  . CT of the chest, abdomen, pelvis  06/03   Chest negative; Abdomen, left adrenal lesion; Pelvis, enlarged prostate  . ESOPHAGOGASTRODUODENOSCOPY  02/13/07   gastritis and duodenitis without bleed  . ESOPHAGOGASTRODUODENOSCOPY N/A 06/26/2014   Procedure: ESOPHAGOGASTRODUODENOSCOPY (EGD);  Surgeon: Milus Banister, MD;  Location: [facility];  Service: Endoscopy;  Laterality: N/A;  . KNEE ARTHROSCOPY  10/00   Right  . LP  06/03  . Microwave thermotherapy prostate  08/28/07   Rogers Blocker  . Persantine cardiolite  03/01   EF 68%  . POLYPECTOMY  11/95  . Stress myoview  07/06   EF 67%    Home Medications:    Medication List       Accurate as of 10/14/15  8:49 AM. Always use your most recent med list.          aspirin 81 MG tablet Take 81 mg by mouth daily.   atorvastatin 20 MG tablet Commonly known as:  LIPITOR Take 1 tablet (20 mg total) by mouth daily.   finasteride 5 MG tablet Commonly known as:  PROSCAR TAKE ONE TABLET BY MOUTH ONCE DAILY   finasteride 5 MG tablet Commonly known as:  PROSCAR Take 1 tablet (5 mg total) by mouth daily.   glipiZIDE 10 MG tablet Commonly known as:  GLUCOTROL Take 10 mg by mouth daily before breakfast.   insulin detemir 100 UNIT/ML injection Commonly known as:  LEVEMIR Inject 20 units at bedtime.   metFORMIN 500 MG tablet Commonly known as:  GLUCOPHAGE TAKE TWO TABLETS BY MOUTH TWICE DAILY WITH A MEAL   metoprolol 50 MG tablet Commonly known as:  LOPRESSOR TAKE ONE-HALF TABLET BY MOUTH TWICE  DAILY   pantoprazole 40 MG tablet Commonly known as:  PROTONIX Take 1 tablet (40 mg total) by mouth daily.   ramipril 10 MG capsule Commonly known as:  ALTACE TAKE ONE CAPSULE BY MOUTH ONCE DAILY   tamsulosin 0.4 MG  Caps capsule Commonly known as:  FLOMAX TAKE TWO CAPSULES BY MOUTH ONCE DAILY       Allergies: No Known Allergies  Family History: Family History  Problem Relation Age of Onset  . Diabetes Father   . Alcohol abuse Paternal Uncle   . Alcohol abuse Paternal Uncle   . Alcohol abuse Paternal Uncle   . Rheum arthritis Mother   . Heart disease Neg Hx   . Stroke Neg Hx   . Cancer Neg Hx   . Drug abuse Neg Hx   . Depression Neg Hx   . Colon cancer Neg Hx   . Rectal cancer Neg Hx   . Stomach cancer Neg Hx     Social History:  reports that he has been smoking Cigarettes.  He has a 40.00 pack-year smoking history. He has never used smokeless tobacco. He reports that he drinks alcohol. He reports that he does not use drugs.  ROS: UROLOGY Frequent Urination?: Yes Hard to postpone urination?: No Burning/pain with urination?: No Get up at night to urinate?: Yes Leakage of urine?: No Urine stream starts and stops?: No Trouble starting stream?: Yes Do you have to strain to urinate?: No Blood in urine?: No Urinary tract infection?: No Sexually transmitted disease?: No Injury to kidneys or bladder?: No Painful intercourse?: No Weak stream?: No Erection problems?: No Penile pain?: No Gastrointestinal Nausea?: No Vomiting?: No Indigestion/heartburn?: No Diarrhea?: No Constipation?: No Constitutional Fever: No Night sweats?: No Weight loss?: No Fatigue?: Yes Skin Skin rash/lesions?: No Itching?: No Eyes Blurred vision?: Yes Double vision?: No Ears/Nose/Throat Sore throat?: No Sinus problems?: No Hematologic/Lymphatic Swollen glands?: No Easy bruising?: Yes Cardiovascular Leg swelling?: No Chest pain?: No Respiratory Cough?: Yes Shortness of  breath?: No Endocrine Excessive thirst?: No Musculoskeletal Back pain?: No Joint pain?: No Neurological Headaches?: No Dizziness?: No Psychologic Depression?: No Anxiety?: No   Physical Exam: Blood pressure (!) 176/67, pulse 61, height '5\' 7"'$  (1.702 m), weight 166 lb 9.6 oz (75.6 kg). Constitutional: Well nourished. Alert and oriented, No acute distress. HEENT: [place] AT, moist mucus membranes. Trachea midline, no masses. Cardiovascular: No clubbing, cyanosis, or edema. Respiratory: Normal respiratory effort, no increased work of breathing. GI: Abdomen is soft, non tender, non distended, no abdominal masses. Liver and spleen not palpable.  No hernias appreciated.  Stool sample for occult testing is not indicated.   GU: No CVA tenderness.  No bladder fullness or masses.  Patient with circumcised phallus.  Urethral meatus is patent.  No penile discharge. No penile lesions or rashes. Scrotum without lesions, cysts, rashes and/or edema.  Testicles are located scrotally bilaterally. No masses are appreciated in the testicles. Left and right epididymis are normal. Rectal: Patient with  normal sphincter tone. Anus and perineum without scarring or rashes. No rectal masses are appreciated. Prostate is approximately 55 grams, no nodules are appreciated. Seminal vesicles are normal. Skin: No rashes, bruises or suspicious lesions. Lymph: No cervical or inguinal adenopathy. Neurologic: Grossly intact, no focal deficits, moving all 4 extremities. Psychiatric: Normal mood and affect.   Laboratory Data:  Lab Results  Component Value Date   CREATININE 0.79 07/10/2014    Lab Results  Component Value Date   PSA 1.94 08/08/2011   PSA 1.73 04/24/2011   PSA 1.74 11/19/2006  PSA 0.8 ng/ml on 10/12/2015  Pertinent Imaging: Results for NYSHAUN, STANDAGE (MRN [id number]) as of 10/14/2015 09:00  Ref. Range 10/14/2015 08:38  Scan Result Unknown 38   Assessment & Plan:   1.  Urinary retention:   Patient is  voiding well on his own at this time.  His retention is brought on by alcohol consumption.  He will try to reduce his alcohol consumption, so to avoid retention episodes.  He is on maximal medicinal therapy with tamsulosin 0.8 mg and finasteride 5 mg daily.   His PVR was 38 mL.         - Bladder Scan (Post Void Residual) in office  2. BPH with LUTS  - IPSS score is 17/2, it is stable  - Continue conservative management, avoiding bladder irritants and timed voiding's  - Continue tamsulosin 0.4 mg twice daily and finasteride 5 mg daily  - RTC in 12 months for IPSS, PSA, PVR and exam   Return in about 1 year (around 10/13/2016) for IPSS, PVR, PSA and exam.  Zara Council, [facility]  [place] [address], [place], [place] [address] [phone]

## 2015-11-17 DIAGNOSIS — E1129 Type 2 diabetes mellitus with other diabetic kidney complication: Secondary | ICD-10-CM | POA: Diagnosis not present

## 2015-11-17 DIAGNOSIS — R809 Proteinuria, unspecified: Secondary | ICD-10-CM | POA: Diagnosis not present

## 2015-11-17 DIAGNOSIS — Z794 Long term (current) use of insulin: Secondary | ICD-10-CM | POA: Diagnosis not present

## 2015-11-24 DIAGNOSIS — E1165 Type 2 diabetes mellitus with hyperglycemia: Secondary | ICD-10-CM | POA: Diagnosis not present

## 2015-11-24 DIAGNOSIS — E1129 Type 2 diabetes mellitus with other diabetic kidney complication: Secondary | ICD-10-CM | POA: Diagnosis not present

## 2015-11-24 DIAGNOSIS — F17200 Nicotine dependence, unspecified, uncomplicated: Secondary | ICD-10-CM | POA: Diagnosis not present

## 2015-11-24 DIAGNOSIS — Z794 Long term (current) use of insulin: Secondary | ICD-10-CM | POA: Diagnosis not present

## 2015-11-24 DIAGNOSIS — R809 Proteinuria, unspecified: Secondary | ICD-10-CM | POA: Diagnosis not present

## 2015-11-24 DIAGNOSIS — I10 Essential (primary) hypertension: Secondary | ICD-10-CM | POA: Diagnosis not present

## 2015-12-14 ENCOUNTER — Encounter: Payer: Self-pay | Admitting: Podiatry

## 2015-12-14 ENCOUNTER — Ambulatory Visit (INDEPENDENT_AMBULATORY_CARE_PROVIDER_SITE_OTHER): Payer: Medicare Other | Admitting: Podiatry

## 2015-12-14 ENCOUNTER — Ambulatory Visit: Payer: Self-pay | Admitting: Podiatry

## 2015-12-14 VITALS — BP 174/94 | HR 58 | Temp 98.1°F | Resp 16 | Ht 67.0 in | Wt 165.0 lb

## 2015-12-14 DIAGNOSIS — L608 Other nail disorders: Secondary | ICD-10-CM

## 2015-12-14 DIAGNOSIS — L603 Nail dystrophy: Secondary | ICD-10-CM | POA: Diagnosis not present

## 2015-12-14 DIAGNOSIS — E0843 Diabetes mellitus due to underlying condition with diabetic autonomic (poly)neuropathy: Secondary | ICD-10-CM

## 2015-12-14 DIAGNOSIS — B351 Tinea unguium: Secondary | ICD-10-CM

## 2015-12-14 DIAGNOSIS — M79609 Pain in unspecified limb: Secondary | ICD-10-CM

## 2015-12-14 DIAGNOSIS — I2510 Atherosclerotic heart disease of native coronary artery without angina pectoris: Secondary | ICD-10-CM | POA: Diagnosis not present

## 2015-12-14 NOTE — Progress Notes (Signed)
   Subjective:    Patient ID: Antonio [name], male    DOB: 1945-10-03, 70 y.o.   MRN: [id number]  HPI    Review of Systems  Respiratory: Positive for cough and wheezing.   All other systems reviewed and are negative.      Objective:   Physical Exam        Assessment & Plan:

## 2015-12-15 DIAGNOSIS — C44212 Basal cell carcinoma of skin of right ear and external auricular canal: Secondary | ICD-10-CM | POA: Diagnosis not present

## 2015-12-19 NOTE — Progress Notes (Signed)
Patient ID: Antonio [name], male   DOB: July 19, 1945, 70 y.o.   MRN: [id number] SUBJECTIVE Patient with a history of diabetes mellitus presents to office today complaining of elongated, thickened nails. Pain while ambulating in shoes. Patient is unable to trim their own nails.   No Known Allergies  OBJECTIVE General Patient is awake, alert, and oriented x 3 and in no acute distress. Derm Skin is dry and supple bilateral. Negative open lesions or macerations. Remaining integument unremarkable. Nails are tender, long, thickened and dystrophic with subungual debris, consistent with onychomycosis, 1-5 bilateral. No signs of infection noted. Vasc  DP and PT pedal pulses palpable bilaterally. Temperature gradient within normal limits.  Neuro Epicritic and protective threshold sensation diminished bilaterally.  Musculoskeletal Exam No symptomatic pedal deformities noted bilateral. Muscular strength within normal limits.  ASSESSMENT 1. Diabetes Mellitus w/ peripheral neuropathy 2. Onychomycosis of nail due to dermatophyte bilateral 3. Pain in foot bilateral  PLAN OF CARE 1. Patient evaluated today. 2. Instructed to maintain good pedal hygiene and foot care. Stressed importance of controlling blood sugar.  3. Mechanical debridement of nails 1-5 bilaterally performed using a nail nipper. Filed with dremel without incident.  4. Return to clinic in 3 mos.    Edrick Kins, DPM

## 2015-12-24 ENCOUNTER — Ambulatory Visit (INDEPENDENT_AMBULATORY_CARE_PROVIDER_SITE_OTHER): Payer: Medicare Other | Admitting: Family Medicine

## 2015-12-24 ENCOUNTER — Encounter: Payer: Self-pay | Admitting: Family Medicine

## 2015-12-24 VITALS — BP 132/74 | HR 76 | Temp 97.8°F | Wt 168.5 lb

## 2015-12-24 DIAGNOSIS — A6920 Lyme disease, unspecified: Secondary | ICD-10-CM

## 2015-12-24 DIAGNOSIS — W57XXXA Bitten or stung by nonvenomous insect and other nonvenomous arthropods, initial encounter: Secondary | ICD-10-CM

## 2015-12-24 DIAGNOSIS — I2510 Atherosclerotic heart disease of native coronary artery without angina pectoris: Secondary | ICD-10-CM

## 2015-12-24 MED ORDER — DOXYCYCLINE HYCLATE 100 MG PO CAPS: 100.0000 mg | ORAL_CAPSULE | Freq: Two times a day (BID) | ORAL | 0 refills | Status: DC

## 2015-12-24 NOTE — Progress Notes (Signed)
Subjective:    Patient ID: CHANSE KAGEL, male    DOB: 1945/02/20, 70 y.o.   MRN: [id number]  HPI This is a 70 yo male who presents today with a tick bite to right leg. He noticed the tick two days ago and pulled it off. He noticed a red area immediately after removing the tick. The area has gotten a little redder, but has not had any drainage or significant increase in size. No fever. Hasn't checked his blood sugar in last couple of days. No nausea, vomiting or abdominal pain. Otherwise feels well. He brings tick in today.   Past Medical History:  Diagnosis Date  . 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 Coronary atherosclerosis of unspecified type of vessel, native or graft   . Diabetes mellitus    Type II  . Esophageal stricture   . Gastritis   . Hiatal hernia   . Hx of colonic polyps   . Hyperlipidemia   . Hypertension   . Internal hemorrhoids without mention of complication   . Lumbago   . PSA elevation    now averaging 2's  . Tobacco abuse   . URI (upper respiratory infection)   . Urinary retention    Past Surgical History:  Procedure Laterality Date  . 2D Echo  06/03  . Abdominal ultrasound  06/03   Negative  . CT of head  and sinuses  06/03   Negative  . CT of the chest, abdomen, pelvis  06/03   Chest negative; Abdomen, left adrenal lesion; Pelvis, enlarged prostate  . ESOPHAGOGASTRODUODENOSCOPY  02/13/07   gastritis and duodenitis without bleed  . ESOPHAGOGASTRODUODENOSCOPY N/A 06/26/2014   Procedure: ESOPHAGOGASTRODUODENOSCOPY (EGD);  Surgeon: Milus Banister, MD;  Location: [facility];  Service: Endoscopy;  Laterality: N/A;  . KNEE ARTHROSCOPY  10/00   Right  . LP  06/03  . Microwave thermotherapy prostate  08/28/07   Rogers Blocker  . Persantine cardiolite  03/01   EF 68%  . POLYPECTOMY  11/95  . Stress myoview  07/06   EF 67%   Family History  Problem Relation Age of Onset  . Diabetes Father   . Alcohol abuse Paternal Uncle   .  Alcohol abuse Paternal Uncle   . Alcohol abuse Paternal Uncle   . Rheum arthritis Mother   . Heart disease Neg Hx   . Stroke Neg Hx   . Cancer Neg Hx   . Drug abuse Neg Hx   . Depression Neg Hx   . Colon cancer Neg Hx   . Rectal cancer Neg Hx   . Stomach cancer Neg Hx    Social History  Substance Use Topics  . Smoking status: Current Every Day Smoker    Packs/day: 1.00    Years: 40.00    Types: Cigarettes  . Smokeless tobacco: Never Used  . Alcohol use 0.0 oz/week     Comment: rarely      Review of Systems Per HPI    Objective:   Physical Exam  Constitutional: He is oriented to person, place, and time. He appears well-developed and well-nourished. No distress.  HENT:  Head: Normocephalic and atraumatic.  Cardiovascular: Normal rate.   Pulmonary/Chest: Effort normal.  Neurological: He is alert and oriented to person, place, and time.  Skin: Skin is warm and dry. Rash noted. He is not diaphoretic.     Vitals reviewed.     BP 132/74   Pulse 76  Temp 97.8 F (36.6 C) (Oral)   Wt 168 lb 8 oz (76.4 kg)   SpO2 98%   BMI 26.39 kg/m  Wt Readings from Last 3 Encounters:  12/24/15 168 lb 8 oz (76.4 kg)  12/14/15 165 lb (74.8 kg)  10/14/15 166 lb 9.6 oz (75.6 kg)       Assessment & Plan:  1. Tick bite, initial encounter - concerned for secondary cellulitis, will treat for 1 week course instead of single dose - doxycycline (VIBRAMYCIN) 100 MG capsule; Take 1 capsule (100 mg total) by mouth 2 (two) times daily.  Dispense: 14 capsule; Refill: 0  2. Erythema migrans (Lyme disease) - Provided written and verbal information regarding diagnosis and treatment. - written and verbal RTC precautions reviewed- fever, worsening rash/streaking, nausea/vomiting, elevated blood sugars (encouraged him to check).   Clarene Reamer, FNP-BC  [facility]  12/24/2015 9:06 AM

## 2015-12-24 NOTE — Progress Notes (Signed)
Pre visit review using our clinic review tool, if applicable. No additional management support is needed unless otherwise documented below in the visit note. 

## 2015-12-24 NOTE — Patient Instructions (Signed)
Please start your antibiotic today- take with food and a full glass of water If you have a fever, worsening rash, nausea or vomiting  Lyme Disease Lyme disease is an infection that affects many parts of the body, including the skin, joints, and nervous system. It is a bacterial infection that starts from the bite of an infected tick. The infection can spread, and some of the symptoms are similar to the flu. If Lyme disease is not treated, it may cause joint pain, swelling, numbness, problems thinking, fatigue, muscle weakness, and other problems. What are the causes? This condition is caused by bacteria called Borrelia burgdorferi. You can get Lyme disease by being bitten by an infected tick. The tick must be attached to your skin to pass along the infection. Deer often carry infected ticks. What increases the risk? The following factors may make you more likely to develop this condition:  Living in or visiting these areas in the [place]  [place].  The [place].  The [place].  Spending time in wooded or grassy areas.  Being outdoors with exposed skin.  Camping, gardening, hiking, fishing, or hunting outdoors.  Failing to remove a tick from your skin within 3-4 days. What are the signs or symptoms? Symptoms of this condition include:  A round, red rash that surrounds the center of the tick bite. This is the first sign of infection. The center of the rash may be blood colored or have tiny blisters.  Fatigue.  Headache.  Chills and fever.  General achiness.  Joint pain, often in the knees.  Muscle pain.  Swollen lymph glands.  Stiff neck. How is this diagnosed? This condition is diagnosed based on:  Your symptoms and medical history.  A physical exam.  A blood test. How is this treated? The main treatment for this condition is antibiotic medicine, which is usually taken by mouth (orally). The length of treatment depends on how soon after a tick  bite you begin taking the medicine. In some cases, treatment is necessary for several weeks. If the infection is severe, antibiotics may need to be given through an IV tube that is inserted into one of your veins. Follow these instructions at home:  Take your antibiotic medicine as told by your health care provider. Do not stop taking the antibiotic even if you start to feel better.  Ask your health care provider about takinga probiotic in between doses of your antibiotic to help avoid stomach upset or diarrhea.  Check with your health care provider before supplementing your treatment. Many alternative therapies have not been proven and may be harmful to you.  Keep all follow-up visits as told by your health care provider. This is important. How is this prevented? You can become reinfected if you get another tick bite from an infected tick. Take these steps to help prevent an infection:  Cover your skin with light-colored clothing when you are outdoors in the spring and summer months.  Spray clothing and skin with bug spray. The spray should be 20-30% DEET.  Avoid wooded, grassy, and shaded areas.  Remove yard litter, brush, trash, and plants that attract deer and rodents.  Check yourself for ticks when you come indoors.  Wash clothing worn each day.  Check your pets for ticks before they come inside.  If you find a tick:  Remove it with tweezers.  Clean your hands and the bite area with rubbing alcohol or soap and water. Pregnant women should take special care  to avoid tick bites because the infection can be passed along to the fetus. Contact a health care provider if:  You have symptoms after treatment.  You have removed a tick and want to bring it to your health care provider for testing. Get help right away if:  You have an irregular heartbeat.  You have nerve pain.  Your face feels numb. This information is not intended to replace advice given to you by your health  care provider. Make sure you discuss any questions you have with your health care provider. Document Released: 05/01/2000 Document Revised: 09/14/2015 Document Reviewed: 09/14/2015 Elsevier Interactive Patient Education  2017 [employer].

## 2015-12-27 ENCOUNTER — Other Ambulatory Visit: Payer: Self-pay | Admitting: Family Medicine

## 2016-01-17 ENCOUNTER — Encounter: Payer: Self-pay | Admitting: Family Medicine

## 2016-01-17 DIAGNOSIS — E119 Type 2 diabetes mellitus without complications: Secondary | ICD-10-CM | POA: Diagnosis not present

## 2016-01-17 LAB — HM DIABETES EYE EXAM

## 2016-02-01 ENCOUNTER — Other Ambulatory Visit: Payer: Self-pay | Admitting: Family Medicine

## 2016-02-22 DIAGNOSIS — E1165 Type 2 diabetes mellitus with hyperglycemia: Secondary | ICD-10-CM | POA: Diagnosis not present

## 2016-02-22 DIAGNOSIS — Z794 Long term (current) use of insulin: Secondary | ICD-10-CM | POA: Diagnosis not present

## 2016-02-29 DIAGNOSIS — E1129 Type 2 diabetes mellitus with other diabetic kidney complication: Secondary | ICD-10-CM | POA: Diagnosis not present

## 2016-02-29 DIAGNOSIS — Z794 Long term (current) use of insulin: Secondary | ICD-10-CM | POA: Diagnosis not present

## 2016-02-29 DIAGNOSIS — R809 Proteinuria, unspecified: Secondary | ICD-10-CM | POA: Diagnosis not present

## 2016-02-29 DIAGNOSIS — F17200 Nicotine dependence, unspecified, uncomplicated: Secondary | ICD-10-CM | POA: Diagnosis not present

## 2016-03-20 ENCOUNTER — Ambulatory Visit (INDEPENDENT_AMBULATORY_CARE_PROVIDER_SITE_OTHER): Payer: Medicare Other | Admitting: Podiatry

## 2016-03-20 ENCOUNTER — Encounter: Payer: Self-pay | Admitting: Podiatry

## 2016-03-20 DIAGNOSIS — B351 Tinea unguium: Secondary | ICD-10-CM | POA: Diagnosis not present

## 2016-03-20 DIAGNOSIS — E0843 Diabetes mellitus due to underlying condition with diabetic autonomic (poly)neuropathy: Secondary | ICD-10-CM

## 2016-03-20 DIAGNOSIS — M79609 Pain in unspecified limb: Secondary | ICD-10-CM

## 2016-03-20 NOTE — Progress Notes (Signed)
Complaint:  Visit Type: Patient returns to my office for continued preventative foot care services. Complaint: Patient states" my nails have grown long and thick and become painful to walk and wear shoes" Patient has been diagnosed with DM with no foot complications. The patient presents for preventative foot care services. No changes to ROS  Podiatric Exam: Vascular: dorsalis pedis and posterior tibial pulses are palpable bilateral. Capillary return is immediate. Temperature gradient is WNL. Skin turgor WNL  Sensorium: Diminished Semmes Weinstein monofilament test. Normal tactile sensation bilaterally. Nail Exam: Pt has thick disfigured discolored nails with subungual debris noted bilateral entire nail hallux through fifth toenails Ulcer Exam: There is no evidence of ulcer or pre-ulcerative changes or infection. Orthopedic Exam: Muscle tone and strength are WNL. No limitations in general ROM. No crepitus or effusions noted. Foot type and digits show no abnormalities. Bony prominences are unremarkable. Skin: No Porokeratosis. No infection or ulcers  Diagnosis:  Onychomycosis, , Pain in right toe, pain in left toes  Treatment & Plan Procedures and Treatment: Consent by patient was obtained for treatment procedures. The patient understood the discussion of treatment and procedures well. All questions were answered thoroughly reviewed. Debridement of mycotic and hypertrophic toenails, 1 through 5 bilateral and clearing of subungual debris. No ulceration, no infection noted. Iatrogenic lesion right hallux Return Visit-Office Procedure: Patient instructed to return to the office for a follow up visit 3 months for continued evaluation and treatment.    Gardiner Barefoot DPM

## 2016-03-23 ENCOUNTER — Ambulatory Visit: Payer: Medicare Other | Admitting: Podiatry

## 2016-05-07 ENCOUNTER — Other Ambulatory Visit: Payer: Self-pay | Admitting: Family Medicine

## 2016-05-23 DIAGNOSIS — R809 Proteinuria, unspecified: Secondary | ICD-10-CM | POA: Diagnosis not present

## 2016-05-23 DIAGNOSIS — Z794 Long term (current) use of insulin: Secondary | ICD-10-CM | POA: Diagnosis not present

## 2016-05-23 DIAGNOSIS — E1129 Type 2 diabetes mellitus with other diabetic kidney complication: Secondary | ICD-10-CM | POA: Diagnosis not present

## 2016-05-24 ENCOUNTER — Other Ambulatory Visit: Payer: Self-pay

## 2016-05-24 MED ORDER — RAMIPRIL 10 MG PO CAPS: 10.0000 mg | ORAL_CAPSULE | Freq: Every day | ORAL | 3 refills | Status: DC

## 2016-05-24 MED ORDER — ATORVASTATIN CALCIUM 20 MG PO TABS: 20.0000 mg | ORAL_TABLET | Freq: Every day | ORAL | 1 refills | Status: DC

## 2016-05-24 NOTE — Telephone Encounter (Signed)
New quantity sent in

## 2016-05-30 DIAGNOSIS — E1165 Type 2 diabetes mellitus with hyperglycemia: Secondary | ICD-10-CM | POA: Diagnosis not present

## 2016-05-30 DIAGNOSIS — R809 Proteinuria, unspecified: Secondary | ICD-10-CM | POA: Diagnosis not present

## 2016-05-30 DIAGNOSIS — Z794 Long term (current) use of insulin: Secondary | ICD-10-CM | POA: Diagnosis not present

## 2016-05-30 DIAGNOSIS — I10 Essential (primary) hypertension: Secondary | ICD-10-CM | POA: Diagnosis not present

## 2016-05-30 DIAGNOSIS — F17200 Nicotine dependence, unspecified, uncomplicated: Secondary | ICD-10-CM | POA: Diagnosis not present

## 2016-06-19 ENCOUNTER — Encounter: Payer: Self-pay | Admitting: Podiatry

## 2016-06-19 ENCOUNTER — Ambulatory Visit (INDEPENDENT_AMBULATORY_CARE_PROVIDER_SITE_OTHER): Payer: Medicare Other | Admitting: Podiatry

## 2016-06-19 DIAGNOSIS — B351 Tinea unguium: Secondary | ICD-10-CM

## 2016-06-19 DIAGNOSIS — M79609 Pain in unspecified limb: Secondary | ICD-10-CM

## 2016-06-19 NOTE — Progress Notes (Addendum)
Complaint:  Visit Type: Patient returns to my office for continued preventative foot care services. Complaint: Patient states" my nails have grown long and thick and become painful to walk and wear shoes" Patient has been diagnosed with DM with no foot complications. The patient presents for preventative foot care services. No changes to ROS  Podiatric Exam: Vascular: dorsalis pedis and posterior tibial pulses are barely palpable bilateral. Capillary return is immediate. Temperature gradient is WNL. Skin turgor WNL  Sensorium: Diminished Semmes Weinstein monofilament test. Normal tactile sensation bilaterally. Nail Exam: Pt has thick disfigured discolored nails with subungual debris noted bilateral entire nail hallux through fifth toenails Ulcer Exam: There is no evidence of ulcer or pre-ulcerative changes or infection. Orthopedic Exam: Muscle tone and strength are WNL. No limitations in general ROM. No crepitus or effusions noted. Foot type and digits show no abnormalities. DJD Midfoot  B/L Skin: No Porokeratosis. No infection or ulcers  Diagnosis:  Onychomycosis, , Pain in right toe, pain in left toes  Treatment & Plan Procedures and Treatment: Consent by patient was obtained for treatment procedures. The patient understood the discussion of treatment and procedures well. All questions were answered thoroughly reviewed. Debridement of mycotic and hypertrophic toenails, 1 through 5 bilateral and clearing of subungual debris. No ulceration, no infection noted.  Return Visit-Office Procedure: Patient instructed to return to the office for a follow up visit 3 months for continued evaluation and treatment.    Gardiner Barefoot DPM

## 2016-07-11 ENCOUNTER — Ambulatory Visit: Payer: Medicare Other

## 2016-07-12 ENCOUNTER — Ambulatory Visit (INDEPENDENT_AMBULATORY_CARE_PROVIDER_SITE_OTHER): Payer: Medicare Other

## 2016-07-12 VITALS — BP 122/76 | HR 53 | Temp 98.2°F | Ht 65.5 in | Wt 166.5 lb

## 2016-07-12 DIAGNOSIS — I10 Essential (primary) hypertension: Secondary | ICD-10-CM | POA: Diagnosis not present

## 2016-07-12 DIAGNOSIS — Z125 Encounter for screening for malignant neoplasm of prostate: Secondary | ICD-10-CM | POA: Diagnosis not present

## 2016-07-12 DIAGNOSIS — Z794 Long term (current) use of insulin: Secondary | ICD-10-CM | POA: Diagnosis not present

## 2016-07-12 DIAGNOSIS — E784 Other hyperlipidemia: Secondary | ICD-10-CM

## 2016-07-12 DIAGNOSIS — Z1159 Encounter for screening for other viral diseases: Secondary | ICD-10-CM | POA: Diagnosis not present

## 2016-07-12 DIAGNOSIS — E119 Type 2 diabetes mellitus without complications: Secondary | ICD-10-CM | POA: Diagnosis not present

## 2016-07-12 DIAGNOSIS — Z0000 Encounter for general adult medical examination without abnormal findings: Secondary | ICD-10-CM

## 2016-07-12 DIAGNOSIS — E7849 Other hyperlipidemia: Secondary | ICD-10-CM

## 2016-07-12 LAB — COMPREHENSIVE METABOLIC PANEL
ALBUMIN: 3.9 g/dL (ref 3.5–5.2)
ALK PHOS: 50 U/L (ref 39–117)
ALT: 8 U/L (ref 0–53)
AST: 10 U/L (ref 0–37)
BUN: 16 mg/dL (ref 6–23)
CO2: 29 mEq/L (ref 19–32)
Calcium: 9.3 mg/dL (ref 8.4–10.5)
Chloride: 102 mEq/L (ref 96–112)
Creatinine, Ser: 0.82 mg/dL (ref 0.40–1.50)
GFR: 98.29 mL/min (ref >60.00)
Glucose, Bld: 132 mg/dL — ABNORMAL HIGH (ref 70–99)
POTASSIUM: 4.4 meq/L (ref 3.5–5.1)
Sodium: 139 mEq/L (ref 135–145)
TOTAL PROTEIN: 6.1 g/dL (ref 6.0–8.3)
Total Bilirubin: 0.8 mg/dL (ref 0.2–1.2)

## 2016-07-12 LAB — LIPID PANEL
CHOLESTEROL: 116 mg/dL (ref 0–200)
HDL: 36.8 mg/dL — ABNORMAL LOW (ref >39.00)
LDL Cholesterol: 53 mg/dL (ref 0–99)
NonHDL: 79.3
TRIGLYCERIDES: 133 mg/dL (ref 0.0–149.0)
Total CHOL/HDL Ratio: 3
VLDL: 26.6 mg/dL (ref 0.0–40.0)

## 2016-07-12 LAB — HEMOGLOBIN A1C: Hgb A1c MFr Bld: 8.9 % — ABNORMAL HIGH (ref 4.6–6.5)

## 2016-07-12 LAB — PSA, MEDICARE: PSA: 0.57 ng/ml (ref 0.10–4.00)

## 2016-07-12 LAB — TSH: TSH: 1.32 u[IU]/mL (ref 0.35–4.50)

## 2016-07-12 NOTE — Progress Notes (Signed)
PCP notes:   Health maintenance:  Hep C screening - completed Foot exam - recent podiatry appt in May 2018 A1C - completed  Abnormal screenings:   None  Patient concerns:   Pt reports concerns with chronic bilateral leg pain. Pain scale: 5/10.  Nurse concerns:  None  Next PCP appt:   07/18/16 @ 0830

## 2016-07-12 NOTE — Progress Notes (Signed)
I reviewed health advisor's note, was available for consultation, and agree with documentation and plan.  

## 2016-07-12 NOTE — Progress Notes (Signed)
Subjective:   Antonio [name] is a 71 y.o. male who presents for Medicare Annual/Subsequent preventive examination.  Review of Systems:  N/A Cardiac Risk Factors include: advanced age (>7men, >69 women);diabetes mellitus;dyslipidemia;hypertension;male gender;smoking/ tobacco exposure     Objective:    Vitals: BP 122/76 (BP Location: Right Arm, Patient Position: Sitting, Cuff Size: Normal)   Pulse (!) 53   Temp 98.2 F (36.8 C) (Oral)   Ht 5' 5.5" (1.664 m) Comment: no shoes  Wt 166 lb 8 oz (75.5 kg)   SpO2 95%   BMI 27.29 kg/m   Body mass index is 27.29 kg/m.  Tobacco History  Smoking Status  . Current Every Day Smoker  . Packs/day: 1.00  . Years: 40.00  . Types: Cigarettes  Smokeless Tobacco  . Never Used     Ready to quit: No Counseling given: No   Past Medical History:  Diagnosis Date  . 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 Coronary atherosclerosis of unspecified type of vessel, native or graft   . Diabetes mellitus    Type II  . Esophageal stricture   . Gastritis   . Hiatal hernia   . Hx of colonic polyps   . Hyperlipidemia   . Hypertension   . Internal hemorrhoids without mention of complication   . Lumbago   . PSA elevation    now averaging 2's  . Tobacco abuse   . URI (upper respiratory infection)   . Urinary retention    Past Surgical History:  Procedure Laterality Date  . 2D Echo  06/03  . Abdominal ultrasound  06/03   Negative  . CT of head  and sinuses  06/03   Negative  . CT of the chest, abdomen, pelvis  06/03   Chest negative; Abdomen, left adrenal lesion; Pelvis, enlarged prostate  . ESOPHAGOGASTRODUODENOSCOPY  02/13/07   gastritis and duodenitis without bleed  . ESOPHAGOGASTRODUODENOSCOPY N/A 06/26/2014   Procedure: ESOPHAGOGASTRODUODENOSCOPY (EGD);  Surgeon: Milus Banister, MD;  Location: [facility];  Service: Endoscopy;  Laterality: N/A;  . KNEE ARTHROSCOPY  10/00   Right  . LP  06/03  . Microwave  thermotherapy prostate  08/28/07   Rogers Blocker  . Persantine cardiolite  03/01   EF 68%  . POLYPECTOMY  11/95  . Stress myoview  07/06   EF 67%   Family History  Problem Relation Age of Onset  . Diabetes Father   . Alcohol abuse Paternal Uncle   . Alcohol abuse Paternal Uncle   . Alcohol abuse Paternal Uncle   . Rheum arthritis Mother   . Heart disease Neg Hx   . Stroke Neg Hx   . Cancer Neg Hx   . Drug abuse Neg Hx   . Depression Neg Hx   . Colon cancer Neg Hx   . Rectal cancer Neg Hx   . Stomach cancer Neg Hx    History  Sexual Activity  . Sexual activity: Not on file    Outpatient Encounter Prescriptions as of 07/12/2016  Medication Sig  . aspirin 81 MG tablet Take 81 mg by mouth daily.  [name] atorvastatin (LIPITOR) 20 MG tablet Take 1 tablet (20 mg total) by mouth daily.  . finasteride (PROSCAR) 5 MG tablet Take 1 tablet (5 mg total) by mouth daily.  [name] glipiZIDE (GLUCOTROL) 10 MG tablet Take 10 mg by mouth daily before breakfast.   . insulin detemir (LEVEMIR) 100 UNIT/ML injection Inject 20 units at bedtime.  [name]  metFORMIN (GLUCOPHAGE) 500 MG tablet TAKE TWO TABLETS BY MOUTH TWICE DAILY WITH A MEAL  . metoprolol (LOPRESSOR) 50 MG tablet TAKE ONE-HALF TABLET BY MOUTH TWICE DAILY  . pantoprazole (PROTONIX) 40 MG tablet Take 1 tablet (40 mg total) by mouth daily.  . ramipril (ALTACE) 10 MG capsule Take 1 capsule (10 mg total) by mouth daily.  . tamsulosin (FLOMAX) 0.4 MG CAPS capsule TAKE TWO CAPSULES BY MOUTH ONCE DAILY   No facility-administered encounter medications on file as of 07/12/2016.     Activities of Daily Living In your present state of health, do you have any difficulty performing the following activities: 07/12/2016  Hearing? Y  Vision? N  Difficulty concentrating or making decisions? N  Walking or climbing stairs? Y  Dressing or bathing? N  Doing errands, shopping? N  Preparing Food and eating ? N  Using the Toilet? N  In the past six months, have you accidently  leaked urine? N  Do you have problems with loss of bowel control? N  Managing your Medications? N  Managing your Finances? N  Housekeeping or managing your Housekeeping? N  Some recent data might be hidden    Patient Care Team: Lucille Passy, MD as PCP - General (Family Medicine) Laneta Simmers as Physician Assistant (Urology) Ladene Artist, MD as Consulting Physician (Gastroenterology)   Assessment:    Hearing Screening Comments: Pt has bilateral hearing aids but has chosen not to wear them Vision Screening Comments: Dec 2017 last exam with Dr. George Ina; pt needs cataract surgery  Exercise Activities and Dietary recommendations Current Exercise Habits: The patient has a physically strenous job, but has no regular exercise apart from work. (pt still is employed as a Air traffic controller), Exercise limited by: None identified  Goals    . Increase water intake          Starting 07/12/16, I will continue to drink at least 6-8 glasses of water daily.       Fall Risk Fall Risk  07/12/2016 04/20/2015  Falls in the past year? No Yes  Number falls in past yr: - 2 or more  Injury with Fall? - No   Depression Screen PHQ 2/9 Scores 07/12/2016 04/20/2015  PHQ - 2 Score 0 0    Cognitive Function MMSE - Mini Mental State Exam 07/12/2016  Orientation to time 5  Orientation to Place 5  Registration 3  Attention/ Calculation 0  Recall 3  Language- name 2 objects 0  Language- repeat 1  Language- follow 3 step command 3  Language- read & follow direction 0  Write a sentence 0  Copy design 0  Total score 20     PLEASE NOTE: A Mini-Cog screen was completed. Maximum score is 20. A value of 0 denotes this part of Folstein MMSE was not completed or the patient failed this part of the Mini-Cog screening.   Mini-Cog Screening Orientation to Time - Max 5 pts Orientation to Place - Max 5 pts Registration - Max 3 pts Recall - Max 3 pts Language Repeat - Max 1 pts Language Follow  3 Step Command - Max 3 pts     Immunization History  Administered Date(s) Administered  . Td 10/08/1994, 11/19/2006   Screening Tests Health Maintenance  Topic Date Due  . PNA vac Low Risk Adult (1 of 2 - PCV13) 12/21/2016 (Originally 12/24/2009)  . INFLUENZA VACCINE  01/21/2057 (Originally 09/06/2016)  . TETANUS/TDAP  11/18/2016  . HEMOGLOBIN A1C  01/11/2017  .  OPHTHALMOLOGY EXAM  01/16/2017  . FOOT EXAM  06/19/2017  . COLONOSCOPY  08/04/2017  . Hepatitis C Screening  Completed      Plan:     I have personally reviewed and addressed the Medicare Annual Wellness questionnaire and have noted the following in the patient's chart:  A. Medical and social history B. Use of alcohol, tobacco or illicit drugs  C. Current medications and supplements D. Functional ability and status E.  Nutritional status F.  Physical activity G. Advance directives H. List of other physicians I.  Hospitalizations, surgeries, and ER visits in previous 12 months J.  [place] to include hearing, vision, cognitive, depression L. Referrals and appointments - none  In addition, I have reviewed and discussed with patient certain preventive protocols, quality metrics, and best practice recommendations. A written personalized care plan for preventive services as well as general preventive health recommendations were provided to patient.  See attached scanned questionnaire for additional information.   Signed,   Lindell Noe, MHA, BS, LPN Health Coach

## 2016-07-12 NOTE — Patient Instructions (Signed)
Antonio [name] , Thank you for taking time to come for your Medicare Wellness Visit. I appreciate your ongoing commitment to your health goals. Please review the following plan we discussed and let me know if I can assist you in the future.   These are the goals we discussed: Goals    . Increase water intake          Starting 07/12/16, I will continue to drink at least 6-8 glasses of water daily.        This is a list of the screening recommended for you and due dates:  Health Maintenance  Topic Date Due  . Pneumonia vaccines (1 of 2 - PCV13) 12/21/2016*  . Flu Shot  01/21/2057*  . Tetanus Vaccine  11/18/2016  . Hemoglobin A1C  01/11/2017  . Eye exam for diabetics  01/16/2017  . Complete foot exam   06/19/2017  . Colon Cancer Screening  08/04/2017  .  Hepatitis C: One time screening is recommended by Center for Disease Control  (CDC) for  adults born from [birth year] through 1966.   Completed  *Topic was postponed. The date shown is not the original due date.   Preventive Care for Adults  A healthy lifestyle and preventive care can promote health and wellness. Preventive health guidelines for adults include the following key practices.  . A routine yearly physical is a good way to check with your health care provider about your health and preventive screening. It is a chance to share any concerns and updates on your health and to receive a thorough exam.  . Visit your dentist for a routine exam and preventive care every 6 months. Brush your teeth twice a day and floss once a day. Good oral hygiene prevents tooth decay and gum disease.  . The frequency of eye exams is based on your age, health, family medical history, use  of contact lenses, and other factors. Follow your health care provider's ecommendations for frequency of eye exams.  . Eat a healthy diet. Foods like vegetables, fruits, whole grains, low-fat dairy products, and lean protein foods contain the nutrients you need without too  many calories. Decrease your intake of foods high in solid fats, added sugars, and salt. Eat the right amount of calories for you. Get information about a proper diet from your health care provider, if necessary.  . Regular physical exercise is one of the most important things you can do for your health. Most adults should get at least 150 minutes of moderate-intensity exercise (any activity that increases your heart rate and causes you to sweat) each week. In addition, most adults need muscle-strengthening exercises on 2 or more days a week.  Silver Sneakers may be a benefit available to you. To determine eligibility, you may visit the website: www.silversneakers.com or contact program at 772-223-7437 Mon-Fri between 8AM-8PM.   . Maintain a healthy weight. The body mass index (BMI) is a screening tool to identify possible weight problems. It provides an estimate of body fat based on height and weight. Your health care provider can find your BMI and can help you achieve or maintain a healthy weight.   For adults 20 years and older: ? A BMI below 18.5 is considered underweight. ? A BMI of 18.5 to 24.9 is normal. ? A BMI of 25 to 29.9 is considered overweight. ? A BMI of 30 and above is considered obese.   . Maintain normal blood lipids and cholesterol levels by exercising and minimizing your intake  of saturated fat. Eat a balanced diet with plenty of fruit and vegetables. Blood tests for lipids and cholesterol should begin at age 89 and be repeated every 5 years. If your lipid or cholesterol levels are high, you are over 50, or you are at high risk for heart disease, you may need your cholesterol levels checked more frequently. Ongoing high lipid and cholesterol levels should be treated with medicines if diet and exercise are not working.  . If you smoke, find out from your health care provider how to quit. If you do not use tobacco, please do not start.  . If you choose to drink alcohol, please  do not consume more than 2 drinks per day. One drink is considered to be 12 ounces (355 mL) of beer, 5 ounces (148 mL) of wine, or 1.5 ounces (44 mL) of liquor.  . If you are 46-64 years old, ask your health care provider if you should take aspirin to prevent strokes.  . Use sunscreen. Apply sunscreen liberally and repeatedly throughout the day. You should seek shade when your shadow is shorter than you. Protect yourself by wearing long sleeves, pants, a wide-brimmed hat, and sunglasses year round, whenever you are outdoors.  . Once a month, do a whole body skin exam, using a mirror to look at the skin on your back. Tell your health care provider of new moles, moles that have irregular borders, moles that are larger than a pencil eraser, or moles that have changed in shape or color.

## 2016-07-12 NOTE — Progress Notes (Signed)
Pre visit review using our clinic review tool, if applicable. No additional management support is needed unless otherwise documented below in the visit note. 

## 2016-07-13 LAB — HEPATITIS C ANTIBODY: HCV AB: NEGATIVE

## 2016-07-18 ENCOUNTER — Encounter: Payer: Self-pay | Admitting: Family Medicine

## 2016-07-18 ENCOUNTER — Ambulatory Visit (INDEPENDENT_AMBULATORY_CARE_PROVIDER_SITE_OTHER): Payer: Medicare Other | Admitting: Family Medicine

## 2016-07-18 VITALS — BP 120/80 | HR 59 | Ht 65.5 in | Wt 170.0 lb

## 2016-07-18 DIAGNOSIS — Z794 Long term (current) use of insulin: Secondary | ICD-10-CM | POA: Diagnosis not present

## 2016-07-18 DIAGNOSIS — E11618 Type 2 diabetes mellitus with other diabetic arthropathy: Secondary | ICD-10-CM

## 2016-07-18 DIAGNOSIS — I10 Essential (primary) hypertension: Secondary | ICD-10-CM

## 2016-07-18 DIAGNOSIS — R972 Elevated prostate specific antigen [PSA]: Secondary | ICD-10-CM

## 2016-07-18 NOTE — Progress Notes (Signed)
Pre visit review using our clinic review tool, if applicable. No additional management support is needed unless otherwise documented below in the visit note. 

## 2016-07-18 NOTE — Progress Notes (Signed)
Subjective:    Patient ID: Antonio [name], male    DOB: 04/26/1945, 71 y.o.   MRN: [id number]  HPI  71 yo pleasant male here  for follow up of chronic medical conditions.  Annual medicare wellness visit with Candis Musa, RN on 07/12/16. Note reviewed.   DM- not well controlled, followed by Dr. Nilda Simmer. Deteriorated. Last saw her on 05/30/16. Note reviewed.  Her plan at outlined in her note from that [place] him about need for better glycemic control. -Restart Levemir. Take 20 units qhs  -Continue metformin, and glipizide. -Advised him to monitor sugars at least once daily. He was encouraged to bring his glucometer to each visit. -Encouraged low carb diet. Encouraged daily exercise.  -Encouraged smoking cessation.  -Continue ASA and ACE-I and statin.  -Follow up in 3 month. Will repeat labs prior.     Lab Results  Component Value Date   HGBA1C 8.9 (H) 07/12/2016    Denies any episodes of hypoglycemia. On ACEI.  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  Denies any current suprapubic pressure or dysuria.  Lab Results  Component Value Date   PSA 0.57 07/12/2016   PSA 0.88 04/20/2015   PSA 1.94 08/08/2011     Wt Readings from Last 3 Encounters:  07/18/16 170 lb (77.1 kg)  07/12/16 166 lb 8 oz (75.5 kg)  12/24/15 168 lb 8 oz (76.4 kg)    HTN- has been stable on Metoprolol.  Denies any HA, blurred vision, CP or LE edema. Lab Results  Component Value Date   CREATININE 0.82 07/12/2016    HLD- on fenofibrate and Atorvastatin. Denies any myalgias.  Lab Results  Component Value Date   CHOL 116 07/12/2016   HDL 36.80 (L) 07/12/2016   LDLCALC 53 07/12/2016   LDLDIRECT 120.3 04/24/2011   TRIG 133.0 07/12/2016   CHOLHDL 3 07/12/2016     Patient Active Problem List   Diagnosis Date Noted  . Medicare annual wellness visit, subsequent 04/20/2015  . BPH with obstruction/lower urinary tract symptoms 10/14/2014  . Hypertension,  uncontrolled 06/26/2014  . Diabetes mellitus type 2, controlled ([place]) 06/26/2014  . Other dysphagia 12/04/2012  . Bladder outlet obstruction 08/10/2011  . ESOPHAGEAL STRICTURE 12/26/2006  . GASTRITIS 12/26/2006  . PSA, INCREASED 11/19/2006  . Diabetes ([place]) 06/27/2006  . HLD (hyperlipidemia) 06/27/2006  . Coronary atherosclerosis 06/27/2006  . HEMORRHOIDS, INTERNAL 06/27/2006  . TOBACCO ABUSE, HX OF 06/27/2006   Past Medical History:  Diagnosis Date  . 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 Coronary atherosclerosis of unspecified type of vessel, native or graft   . Diabetes mellitus    Type II  . Esophageal stricture   . Gastritis   . Hiatal hernia   . Hx of colonic polyps   . Hyperlipidemia   . Hypertension   . Internal hemorrhoids without mention of complication   . Lumbago   . PSA elevation    now averaging 2's  . Tobacco abuse   . URI (upper respiratory infection)   . Urinary retention    Past Surgical History:  Procedure Laterality Date  . 2D Echo  06/03  . Abdominal ultrasound  06/03   Negative  . CT of head  and sinuses  06/03   Negative  . CT of the chest, abdomen, pelvis  06/03   Chest negative; Abdomen, left adrenal lesion; Pelvis, enlarged prostate  . ESOPHAGOGASTRODUODENOSCOPY  02/13/07  gastritis and duodenitis without bleed  . ESOPHAGOGASTRODUODENOSCOPY N/A 06/26/2014   Procedure: ESOPHAGOGASTRODUODENOSCOPY (EGD);  Surgeon: Milus Banister, MD;  Location: [facility];  Service: Endoscopy;  Laterality: N/A;  . KNEE ARTHROSCOPY  10/00   Right  . LP  06/03  . Microwave thermotherapy prostate  08/28/07   [facility] Blocker  . Persantine cardiolite  03/01   EF 68%  . POLYPECTOMY  11/95  . Stress myoview  07/06   EF 67%   Social History  Substance Use Topics  . Smoking status: Current Every Day Smoker    Packs/day: 1.00    Years: 40.00    Types: Cigarettes  . Smokeless tobacco: Never Used  . Alcohol use 0.0 oz/week     Comment:  rarely   Family History  Problem Relation Age of Onset  . Diabetes Father   . Alcohol abuse Paternal Uncle   . Alcohol abuse Paternal Uncle   . Alcohol abuse Paternal Uncle   . Rheum arthritis Mother   . Heart disease Neg Hx   . Stroke Neg Hx   . Cancer Neg Hx   . Drug abuse Neg Hx   . Depression Neg Hx   . Colon cancer Neg Hx   . Rectal cancer Neg Hx   . Stomach cancer Neg Hx    No Known Allergies Current Outpatient Prescriptions on File Prior to Visit  Medication Sig Dispense Refill  . aspirin 81 MG tablet Take 81 mg by mouth daily.    [name] atorvastatin (LIPITOR) 20 MG tablet Take 1 tablet (20 mg total) by mouth daily. 90 tablet 1  . finasteride (PROSCAR) 5 MG tablet Take 1 tablet (5 mg total) by mouth daily. 90 tablet 4  . glipiZIDE (GLUCOTROL) 10 MG tablet Take 10 mg by mouth daily before breakfast.     . insulin detemir (LEVEMIR) 100 UNIT/ML injection Inject 20 units at bedtime. 10 pen 5  . metFORMIN (GLUCOPHAGE) 500 MG tablet TAKE TWO TABLETS BY MOUTH TWICE DAILY WITH A MEAL 120 tablet 0  . metoprolol (LOPRESSOR) 50 MG tablet TAKE ONE-HALF TABLET BY MOUTH TWICE DAILY 30 tablet 2  . pantoprazole (PROTONIX) 40 MG tablet Take 1 tablet (40 mg total) by mouth daily. 30 tablet 2  . ramipril (ALTACE) 10 MG capsule Take 1 capsule (10 mg total) by mouth daily. 90 capsule 3  . tamsulosin (FLOMAX) 0.4 MG CAPS capsule TAKE TWO CAPSULES BY MOUTH ONCE DAILY 180 capsule 4   No current facility-administered medications on file prior to visit.    The PMH, PSH, Social History, Family History, Medications, and allergies have been reviewed in [facility], and have been updated if relevant.  Review of Systems  Constitutional: Negative.   HENT: Negative.   Eyes: Negative.   Respiratory: Negative.   Cardiovascular: Negative.   Gastrointestinal: Negative.   Genitourinary: Negative.   Musculoskeletal: Negative.   Skin: Negative.   Allergic/Immunologic: Negative.   Neurological: Negative.    Hematological: Negative.   Psychiatric/Behavioral: Negative.   All other systems reviewed and are negative.   Objective:   Physical Exam BP 120/80   Pulse (!) 59   Ht 5' 5.5" (1.664 m)   Wt 170 lb (77.1 kg)   SpO2 97%   BMI 27.86 kg/m  Wt Readings from Last 3 Encounters:  07/18/16 170 lb (77.1 kg)  07/12/16 166 lb 8 oz (75.5 kg)  12/24/15 168 lb 8 oz (76.4 kg)   General:  pleasant male in no acute distress Eyes:  PERRL Ears:  External ear exam shows no significant lesions or deformities.  TMs normal bilaterally Hearing is grossly normal bilaterally. Nose:  External nasal examination shows no deformity or inflammation. Nasal mucosa are pink and moist without lesions or exudates. Mouth:  Oral mucosa and oropharynx without lesions or exudates.  Teeth in good repair. Neck:  no carotid bruit or thyromegaly no cervical or supraclavicular lymphadenopathy  Lungs:  Normal respiratory effort, chest expands symmetrically. Lungs are clear to auscultation, no crackles or wheezes. Heart:  Normal rate and regular rhythm. S1 and S2 normal without gallop, murmur, click, rub or other extra sounds. Abdomen:  Bowel sounds positive,abdomen soft and non-tender without masses, organomegaly or hernias noted. Pulses:  R and L posterior tibial pulses are full and equal bilaterally  Extremities:  no edema  Psych:  Good eye contact, not anxious or depressed appearing     Assessment & Plan:

## 2016-07-18 NOTE — Assessment & Plan Note (Signed)
Not well controlled. He is followed by endo. No changes made today- he is working on medication compliance.

## 2016-07-18 NOTE — Patient Instructions (Signed)
Great to see you!   

## 2016-07-18 NOTE — Assessment & Plan Note (Signed)
Well controlled- at goal for a diabetic. No changes made today.

## 2016-07-18 NOTE — Assessment & Plan Note (Addendum)
PSA is improved this month, symptoms controlled.

## 2016-07-22 ENCOUNTER — Other Ambulatory Visit: Payer: Self-pay | Admitting: Gastroenterology

## 2016-08-28 ENCOUNTER — Other Ambulatory Visit: Payer: Self-pay

## 2016-08-29 ENCOUNTER — Other Ambulatory Visit: Payer: Self-pay | Admitting: Gastroenterology

## 2016-08-29 ENCOUNTER — Other Ambulatory Visit: Payer: Self-pay | Admitting: Family Medicine

## 2016-08-30 DIAGNOSIS — Z794 Long term (current) use of insulin: Secondary | ICD-10-CM | POA: Diagnosis not present

## 2016-08-30 DIAGNOSIS — E1165 Type 2 diabetes mellitus with hyperglycemia: Secondary | ICD-10-CM | POA: Diagnosis not present

## 2016-09-06 DIAGNOSIS — E1129 Type 2 diabetes mellitus with other diabetic kidney complication: Secondary | ICD-10-CM | POA: Diagnosis not present

## 2016-09-06 DIAGNOSIS — I10 Essential (primary) hypertension: Secondary | ICD-10-CM | POA: Diagnosis not present

## 2016-09-06 DIAGNOSIS — R809 Proteinuria, unspecified: Secondary | ICD-10-CM | POA: Diagnosis not present

## 2016-09-06 DIAGNOSIS — E1165 Type 2 diabetes mellitus with hyperglycemia: Secondary | ICD-10-CM | POA: Diagnosis not present

## 2016-09-06 DIAGNOSIS — Z794 Long term (current) use of insulin: Secondary | ICD-10-CM | POA: Diagnosis not present

## 2016-09-06 DIAGNOSIS — F17200 Nicotine dependence, unspecified, uncomplicated: Secondary | ICD-10-CM | POA: Diagnosis not present

## 2016-09-25 ENCOUNTER — Ambulatory Visit (INDEPENDENT_AMBULATORY_CARE_PROVIDER_SITE_OTHER): Payer: Medicare Other | Admitting: Podiatry

## 2016-09-25 DIAGNOSIS — M79609 Pain in unspecified limb: Secondary | ICD-10-CM | POA: Diagnosis not present

## 2016-09-25 DIAGNOSIS — E1142 Type 2 diabetes mellitus with diabetic polyneuropathy: Secondary | ICD-10-CM

## 2016-09-25 DIAGNOSIS — B351 Tinea unguium: Secondary | ICD-10-CM | POA: Diagnosis not present

## 2016-09-25 NOTE — Progress Notes (Signed)
Complaint:  Visit Type: Patient returns to my office for continued preventative foot care services. Complaint: Patient states" my nails have grown long and thick and become painful to walk and wear shoes" Patient has been diagnosed with DM with neuropathy. The patient presents for preventative foot care services. No changes to ROS  Podiatric Exam: Vascular: dorsalis pedis and posterior tibial pulses are barely palpable bilateral. Capillary return is immediate. Temperature gradient is WNL. Skin turgor WNL  Sensorium: Diminished Semmes Weinstein monofilament test. Normal tactile sensation bilaterally. Nail Exam: Pt has thick disfigured discolored nails with subungual debris noted bilateral entire nail hallux through fifth toenails Ulcer Exam: There is no evidence of ulcer or pre-ulcerative changes or infection. Orthopedic Exam: Muscle tone and strength are WNL. No limitations in general ROM. No crepitus or effusions noted. Foot type and digits show no abnormalities. DJD Midfoot  B/L Skin: No Porokeratosis. No infection or ulcers  Diagnosis:  Onychomycosis, , Pain in right toe, pain in left toes  Treatment & Plan Procedures and Treatment: Consent by patient was obtained for treatment procedures. The patient understood the discussion of treatment and procedures well. All questions were answered thoroughly reviewed. Debridement of mycotic and hypertrophic toenails, 1 through 5 bilateral and clearing of subungual debris. No ulceration, no infection noted.  Return Visit-Office Procedure: Patient instructed to return to the office for a follow up visit 3 months for continued evaluation and treatment.    Gardiner Barefoot DPM

## 2016-10-05 ENCOUNTER — Other Ambulatory Visit: Payer: Medicare Other

## 2016-10-05 DIAGNOSIS — N401 Enlarged prostate with lower urinary tract symptoms: Secondary | ICD-10-CM

## 2016-10-06 LAB — PSA: Prostate Specific Ag, Serum: 0.7 ng/mL (ref 0.0–4.0)

## 2016-10-11 DIAGNOSIS — Z87898 Personal history of other specified conditions: Secondary | ICD-10-CM | POA: Insufficient documentation

## 2016-10-11 NOTE — Progress Notes (Signed)
8:36 AM   Antonio [name] April 05, 1945 [id number]  Referring provider: Lucille Passy, MD [place], [place] [address]  Chief Complaint  Patient presents with  . Urinary Retention    1 year follow up  . 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   Previous history:  Patient was seen in the emergency room [facility], 06/20/2014) for urinary retention. He presented to our office 4 days later for a voiding trial. He passed a fill and spill performed in the office. He was started on finasteride and his Flomax was increased to 2 capsules daily.  Sometime later, he went into retention again and another Foley catheter was placed. He presented to our office on 07/09/2014 for Foley catheter removal. Foley catheter was removed that morning. He returned  in the afternoon in urinary retention. He was instructed on CIC.  Background story:  Patient has a long history of BPH. Had a Microwave procedure 8 yrs ago by Dr. Yves Dill. Did well for 5 yrs or so, had an episode of urinary retention again in 2014 and had a catheter temporarily at that point. He then developed urinary retention again last month. He was sent to [facility] for follow-up and had his catheter removed. He followed up 24 hours later and PVR was checked, and ok. He then developed retention again several days later and was seen in the [facility] ED.   History of urinary retention His baseline urinary symptoms are frequency, urgency, nocturia, intermittency and weak urinary stream. He is on maximum medical therapy with tamsulosin 0.8 mg daily and finasteride 5 mg daily.  His urinary symptoms seemed to worsen when he consumes alcohol and he is comfortable with CIC.  His PVR was 38 mL today.  BPH WITH LUTS His IPSS  score today is 16, which is moderate lower urinary tract symptomatology.  He is mixed with his quality life due to his urinary symptoms.  His PVR is 12 mL.   His previous IPSS score was 17/2.  His previous PVR is 38 mL.  His major complaint today urinary retention when he drinks alcohol, frequency, nocturia x 2 and hesitancy.    He is CIC x 1 weekly.  He has had these symptoms for four years.  He denies any dysuria, hematuria or suprapubic pain.  He currently taking tamsulosin 0.8 mg daily and finasteride 5 mg daily.   His has had TARGIS approximately 8 years ago.  He also denies any recent fevers, chills, nausea or vomiting.  He does not have a family history of PCa.      IPSS    Row Name 10/12/16 0800         International Prostate Symptom Score   How often have you had the sensation of not emptying your bladder? Less than half the time     How often have you had to urinate less than every two hours? About half the time     How often have you found you stopped and started again several times when you urinated? Less than half the time     How often have you found it difficult to postpone urination? Less than half the time     How often have you had  a weak urinary stream? About half the time     How often have you had to strain to start urination? Less than half the time     How many times did you typically get up at night to urinate? 2 Times     Total IPSS Score 16       Quality of Life due to urinary symptoms   If you were to spend the rest of your life with your urinary condition just the way it is now how would you feel about that? Mixed        Score:  1-7 Mild 8-19 Moderate 20-35 Severe    PMH: Past Medical History:  Diagnosis Date  . 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 Coronary atherosclerosis of unspecified type of vessel, native or graft   . Diabetes mellitus    Type II  . Esophageal stricture   . Gastritis   . Hiatal hernia   . Hx of  colonic polyps   . Hyperlipidemia   . Hypertension   . Internal hemorrhoids without mention of complication   . Lumbago   . PSA elevation    now averaging 2's  . Tobacco abuse   . URI (upper respiratory infection)   . Urinary retention     Surgical History: Past Surgical History:  Procedure Laterality Date  . 2D Echo  06/03  . Abdominal ultrasound  06/03   Negative  . CT of head  and sinuses  06/03   Negative  . CT of the chest, abdomen, pelvis  06/03   Chest negative; Abdomen, left adrenal lesion; Pelvis, enlarged prostate  . ESOPHAGOGASTRODUODENOSCOPY  02/13/07   gastritis and duodenitis without bleed  . ESOPHAGOGASTRODUODENOSCOPY N/A 06/26/2014   Procedure: ESOPHAGOGASTRODUODENOSCOPY (EGD);  Surgeon: Milus Banister, MD;  Location: [facility];  Service: Endoscopy;  Laterality: N/A;  . KNEE ARTHROSCOPY  10/00   Right  . LP  06/03  . Microwave thermotherapy prostate  08/28/07   Rogers Blocker  . Persantine cardiolite  03/01   EF 68%  . POLYPECTOMY  11/95  . Stress myoview  07/06   EF 67%    Home Medications:  Allergies as of 10/12/2016   No Known Allergies     Medication List       Accurate as of 10/12/16  8:36 AM. Always use your most recent med list.          aspirin 81 MG tablet Take 81 mg by mouth daily.   atorvastatin 20 MG tablet Commonly known as:  LIPITOR Take 1 tablet (20 mg total) by mouth daily.   finasteride 5 MG tablet Commonly known as:  PROSCAR Take 1 tablet (5 mg total) by mouth daily.   glipiZIDE 10 MG tablet Commonly known as:  GLUCOTROL Take 10 mg by mouth daily before breakfast.   insulin detemir 100 UNIT/ML injection Commonly known as:  LEVEMIR Inject 20 units at bedtime.   INSULIN SYRINGE .3CC/31GX5/16" 31G X 5/16" 0.3 ML Misc Use as directed. Use once daily.   [employer] TEST STRIPS test strip Generic drug:  glucose blood Use one strip three times daily.   metFORMIN 500 MG tablet Commonly known as:  GLUCOPHAGE TAKE TWO TABLETS BY  MOUTH TWICE DAILY WITH A MEAL   metoprolol tartrate 50 MG tablet Commonly known as:  LOPRESSOR TAKE 1/2 (ONE-HALF) TABLET BY MOUTH TWICE DAILY   pantoprazole 40 MG tablet Commonly known as:  PROTONIX Take 1 tablet (  40 mg total) by mouth daily.   pantoprazole 40 MG tablet Commonly known as:  PROTONIX TAKE ONE TABLET BY MOUTH ONCE DAILY (CALL  [facility]  FOR  FURTHER  REFILLS  AND  MAKE  AN  APPOINTMENT  WITH  DR  Fuller Plan)   ramipril 10 MG capsule Commonly known as:  ALTACE Take 1 capsule (10 mg total) by mouth daily.   tamsulosin 0.4 MG Caps capsule Commonly known as:  FLOMAX TAKE TWO CAPSULES BY MOUTH ONCE DAILY            Discharge Care Instructions        Start     Ordered   10/12/16 0000  BLADDER SCAN AMB NON-IMAGING     10/12/16 0831      Allergies: No Known Allergies  Family History: Family History  Problem Relation Age of Onset  . Diabetes Father   . Alcohol abuse Paternal Uncle   . Alcohol abuse Paternal Uncle   . Alcohol abuse Paternal Uncle   . Rheum arthritis Mother   . Heart disease Neg Hx   . Stroke Neg Hx   . Cancer Neg Hx   . Drug abuse Neg Hx   . Depression Neg Hx   . Colon cancer Neg Hx   . Rectal cancer Neg Hx   . Stomach cancer Neg Hx   . Kidney cancer Neg Hx   . Bladder Cancer Neg Hx   . Prostate cancer Neg Hx     Social History:  reports that he has been smoking Cigarettes.  He has a 40.00 pack-year smoking history. He has never used smokeless tobacco. He reports that he drinks alcohol. He reports that he does not use drugs.  ROS: UROLOGY Frequent Urination?: Yes Hard to postpone urination?: No Burning/pain with urination?: No Get up at night to urinate?: Yes Leakage of urine?: No Urine stream starts and stops?: No Trouble starting stream?: No Do you have to strain to urinate?: No Blood in urine?: No Urinary tract infection?: No Sexually transmitted disease?: No Injury to kidneys or bladder?: No Painful intercourse?:  No Weak stream?: Yes Erection problems?: No Penile pain?: No Gastrointestinal Nausea?: No Vomiting?: No Indigestion/heartburn?: No Diarrhea?: No Constipation?: No Constitutional Fever: No Night sweats?: No Weight loss?: No Fatigue?: Yes Skin Skin rash/lesions?: Yes Itching?: No Eyes Blurred vision?: No Double vision?: No Ears/Nose/Throat Sore throat?: No Sinus problems?: No Hematologic/Lymphatic Swollen glands?: No Easy bruising?: Yes Cardiovascular Leg swelling?: No Chest pain?: No Respiratory Cough?: Yes Shortness of breath?: No Endocrine Excessive thirst?: No Musculoskeletal Back pain?: Yes Joint pain?: No Neurological Headaches?: No Dizziness?: No Psychologic Depression?: No Anxiety?: No   Physical Exam: Blood pressure (!) 162/75, pulse (!) 57, height 5\' 6"  (1.676 m), weight 169 lb (76.7 kg). Constitutional: Well nourished. Alert and oriented, No acute distress. HEENT: [place] AT, moist mucus membranes. Trachea midline, no masses. Cardiovascular: No clubbing, cyanosis, or edema. Respiratory: Normal respiratory effort, no increased work of breathing. GI: Abdomen is soft, non tender, non distended, no abdominal masses. Liver and spleen not palpable.  No hernias appreciated.  Stool sample for occult testing is not indicated.   GU: No CVA tenderness.  No bladder fullness or masses.  Patient with circumcised phallus.  Urethral meatus is patent.  No penile discharge. No penile lesions or rashes. Scrotum without lesions, cysts, rashes and/or edema.  Testicles are located scrotally bilaterally. No masses are appreciated in the testicles. Left and right epididymis are normal. Rectal: Patient with  normal sphincter tone. Anus and perineum without scarring or rashes. No rectal masses are appreciated. Prostate is approximately 55 grams, no nodules are appreciated. Seminal vesicles are normal. Skin: No rashes, bruises or suspicious lesions. Lymph: No cervical or inguinal  adenopathy. Neurologic: Grossly intact, no focal deficits, moving all 4 extremities. Psychiatric: Normal mood and affect.   Laboratory Data:  Lab Results  Component Value Date   PSA 1.94 08/08/2011   PSA 1.73 04/24/2011   PSA 1.74 11/19/2006  PSA 0.8 ng/ml on 10/12/2015 PSA 0.7 ng/mL on 10/06/2016  Pertinent Imaging: Results for XZAYVIER, FAGIN (MRN [id number]) as of 10/12/2016 08:35  Ref. Range 10/12/2016 08:32  Scan Result Unknown 12    Assessment & Plan:   1. History of urinary retention:   Patient is voiding well on his own at this time.  His retention is brought on by alcohol consumption.  He will try to reduce his alcohol consumption, so to avoid retention episodes.  He is on maximal medicinal therapy with tamsulosin 0.8 mg and finasteride 5 mg daily.   His PVR is 12 mL.         - Bladder Scan (Post Void Residual) in office  2. BPH with LUTS  - IPSS score is 16/3, it is stable  - Continue conservative management, avoiding bladder irritants and timed voiding's  - Continue tamsulosin 0.4 mg twice daily and finasteride 5 mg daily  - RTC in 12 months for IPSS, PSA, PVR and exam   Return in about 1 year (around 10/12/2017) for IPSS, PSA , PVR and exam.  Zara Council, [facility]  [place] [address], [place], [place] [address] [phone]

## 2016-10-12 ENCOUNTER — Encounter: Payer: Self-pay | Admitting: Urology

## 2016-10-12 ENCOUNTER — Ambulatory Visit (INDEPENDENT_AMBULATORY_CARE_PROVIDER_SITE_OTHER): Payer: Medicare Other | Admitting: Urology

## 2016-10-12 VITALS — BP 162/75 | HR 57 | Ht 66.0 in | Wt 169.0 lb

## 2016-10-12 DIAGNOSIS — Z87898 Personal history of other specified conditions: Secondary | ICD-10-CM | POA: Diagnosis not present

## 2016-10-12 DIAGNOSIS — N401 Enlarged prostate with lower urinary tract symptoms: Secondary | ICD-10-CM | POA: Diagnosis not present

## 2016-10-12 DIAGNOSIS — N138 Other obstructive and reflux uropathy: Secondary | ICD-10-CM | POA: Diagnosis not present

## 2016-10-12 LAB — BLADDER SCAN AMB NON-IMAGING: Scan Result: 12

## 2016-11-15 ENCOUNTER — Other Ambulatory Visit: Payer: Self-pay | Admitting: Gastroenterology

## 2016-11-15 ENCOUNTER — Other Ambulatory Visit: Payer: Self-pay | Admitting: Urology

## 2016-11-21 ENCOUNTER — Other Ambulatory Visit: Payer: Self-pay | Admitting: Gastroenterology

## 2016-11-22 ENCOUNTER — Other Ambulatory Visit: Payer: Self-pay | Admitting: Gastroenterology

## 2016-12-06 DIAGNOSIS — Z794 Long term (current) use of insulin: Secondary | ICD-10-CM | POA: Diagnosis not present

## 2016-12-06 DIAGNOSIS — E1165 Type 2 diabetes mellitus with hyperglycemia: Secondary | ICD-10-CM | POA: Diagnosis not present

## 2016-12-14 DIAGNOSIS — Z794 Long term (current) use of insulin: Secondary | ICD-10-CM | POA: Diagnosis not present

## 2016-12-14 DIAGNOSIS — E1129 Type 2 diabetes mellitus with other diabetic kidney complication: Secondary | ICD-10-CM | POA: Diagnosis not present

## 2016-12-14 DIAGNOSIS — R809 Proteinuria, unspecified: Secondary | ICD-10-CM | POA: Diagnosis not present

## 2016-12-14 DIAGNOSIS — F17200 Nicotine dependence, unspecified, uncomplicated: Secondary | ICD-10-CM | POA: Diagnosis not present

## 2016-12-21 ENCOUNTER — Other Ambulatory Visit: Payer: Self-pay | Admitting: Gastroenterology

## 2017-01-01 ENCOUNTER — Encounter: Payer: Self-pay | Admitting: Podiatry

## 2017-01-01 ENCOUNTER — Ambulatory Visit (INDEPENDENT_AMBULATORY_CARE_PROVIDER_SITE_OTHER): Payer: Medicare Other | Admitting: Podiatry

## 2017-01-01 DIAGNOSIS — E1142 Type 2 diabetes mellitus with diabetic polyneuropathy: Secondary | ICD-10-CM

## 2017-01-01 DIAGNOSIS — B351 Tinea unguium: Secondary | ICD-10-CM

## 2017-01-01 DIAGNOSIS — M79609 Pain in unspecified limb: Secondary | ICD-10-CM

## 2017-01-01 NOTE — Progress Notes (Signed)
Complaint:  Visit Type: Patient returns to my office for continued preventative foot care services. Complaint: Patient states" my nails have grown long and thick and become painful to walk and wear shoes" Patient has been diagnosed with DM with neuropathy. The patient presents for preventative foot care services. No changes to ROS  Podiatric Exam: Vascular: dorsalis pedis and posterior tibial pulses are barely palpable bilateral. Capillary return is immediate. Temperature gradient is WNL. Skin turgor WNL  Sensorium: Diminished Semmes Weinstein monofilament test. Normal tactile sensation bilaterally. Nail Exam: Pt has thick disfigured discolored nails with subungual debris noted bilateral entire nail hallux through fifth toenails Ulcer Exam: There is no evidence of ulcer or pre-ulcerative changes or infection. Orthopedic Exam: Muscle tone and strength are WNL. No limitations in general ROM. No crepitus or effusions noted. Foot type and digits show no abnormalities. DJD Midfoot  B/L Skin: No Porokeratosis. No infection or ulcers  Diagnosis:  Onychomycosis, , Pain in right toe, pain in left toes  Treatment & Plan Procedures and Treatment: Consent by patient was obtained for treatment procedures. The patient understood the discussion of treatment and procedures well. All questions were answered thoroughly reviewed. Debridement of mycotic and hypertrophic toenails, 1 through 5 bilateral and clearing of subungual debris. No ulceration, no infection noted.  Return Visit-Office Procedure: Patient instructed to return to the office for a follow up visit 3 months for continued evaluation and treatment.    Gardiner Barefoot DPM

## 2017-01-18 ENCOUNTER — Other Ambulatory Visit: Payer: Self-pay | Admitting: Gastroenterology

## 2017-01-18 ENCOUNTER — Other Ambulatory Visit: Payer: Self-pay | Admitting: Urology

## 2017-01-18 DIAGNOSIS — N401 Enlarged prostate with lower urinary tract symptoms: Principal | ICD-10-CM

## 2017-01-18 DIAGNOSIS — N138 Other obstructive and reflux uropathy: Secondary | ICD-10-CM

## 2017-03-16 DIAGNOSIS — I10 Essential (primary) hypertension: Secondary | ICD-10-CM | POA: Diagnosis not present

## 2017-03-16 DIAGNOSIS — Z794 Long term (current) use of insulin: Secondary | ICD-10-CM | POA: Diagnosis not present

## 2017-03-16 DIAGNOSIS — R809 Proteinuria, unspecified: Secondary | ICD-10-CM | POA: Diagnosis not present

## 2017-03-16 DIAGNOSIS — E1129 Type 2 diabetes mellitus with other diabetic kidney complication: Secondary | ICD-10-CM | POA: Diagnosis not present

## 2017-03-16 DIAGNOSIS — F17200 Nicotine dependence, unspecified, uncomplicated: Secondary | ICD-10-CM | POA: Diagnosis not present

## 2017-03-16 LAB — HEMOGLOBIN A1C: HEMOGLOBIN A1C: 7.6

## 2017-04-05 ENCOUNTER — Ambulatory Visit: Payer: Medicare Other | Admitting: Podiatry

## 2017-04-16 ENCOUNTER — Ambulatory Visit (INDEPENDENT_AMBULATORY_CARE_PROVIDER_SITE_OTHER): Payer: Medicare Other | Admitting: Podiatry

## 2017-04-16 ENCOUNTER — Encounter: Payer: Self-pay | Admitting: Podiatry

## 2017-04-16 DIAGNOSIS — E1142 Type 2 diabetes mellitus with diabetic polyneuropathy: Secondary | ICD-10-CM

## 2017-04-16 DIAGNOSIS — M79609 Pain in unspecified limb: Secondary | ICD-10-CM | POA: Diagnosis not present

## 2017-04-16 DIAGNOSIS — B351 Tinea unguium: Secondary | ICD-10-CM | POA: Diagnosis not present

## 2017-04-16 NOTE — Progress Notes (Signed)
Complaint:  Visit Type: Patient returns to my office for continued preventative foot care services. Complaint: Patient states" my nails have grown long and thick and become painful to walk and wear shoes" Patient has been diagnosed with DM with neuropathy. The patient presents for preventative foot care services. No changes to ROS  Podiatric Exam: Vascular: dorsalis pedis and posterior tibial pulses are barely palpable bilateral. Capillary return is immediate. Temperature gradient is WNL. Skin turgor WNL  Sensorium: Diminished Semmes Weinstein monofilament test. Normal tactile sensation bilaterally. Nail Exam: Pt has thick disfigured discolored nails with subungual debris noted bilateral entire nail hallux through fifth toenails Ulcer Exam: There is no evidence of ulcer or pre-ulcerative changes or infection. Orthopedic Exam: Muscle tone and strength are WNL. No limitations in general ROM. No crepitus or effusions noted. Foot type and digits show no abnormalities. DJD Midfoot  B/L Skin: No Porokeratosis. No infection or ulcers  Diagnosis:  Onychomycosis, , Pain in right toe, pain in left toes  Treatment & Plan Procedures and Treatment: Consent by patient was obtained for treatment procedures. The patient understood the discussion of treatment and procedures well. All questions were answered thoroughly reviewed. Debridement of mycotic and hypertrophic toenails, 1 through 5 bilateral and clearing of subungual debris. No ulceration, no infection noted. ABN signed for 2019. Return Visit-Office Procedure: Patient instructed to return to the office for a follow up visit 3 months for continued evaluation and treatment.    Gardiner Barefoot DPM

## 2017-04-23 ENCOUNTER — Ambulatory Visit: Payer: Self-pay | Admitting: Internal Medicine

## 2017-04-23 ENCOUNTER — Ambulatory Visit: Payer: Medicare Other | Admitting: Internal Medicine

## 2017-04-30 ENCOUNTER — Encounter: Payer: Self-pay | Admitting: Internal Medicine

## 2017-04-30 ENCOUNTER — Ambulatory Visit (INDEPENDENT_AMBULATORY_CARE_PROVIDER_SITE_OTHER): Payer: Medicare Other | Admitting: Internal Medicine

## 2017-04-30 DIAGNOSIS — E11618 Type 2 diabetes mellitus with other diabetic arthropathy: Secondary | ICD-10-CM | POA: Diagnosis not present

## 2017-04-30 DIAGNOSIS — K2100 Gastro-esophageal reflux disease with esophagitis, without bleeding: Secondary | ICD-10-CM

## 2017-04-30 DIAGNOSIS — I10 Essential (primary) hypertension: Secondary | ICD-10-CM | POA: Diagnosis not present

## 2017-04-30 DIAGNOSIS — K219 Gastro-esophageal reflux disease without esophagitis: Secondary | ICD-10-CM | POA: Insufficient documentation

## 2017-04-30 DIAGNOSIS — Z794 Long term (current) use of insulin: Secondary | ICD-10-CM

## 2017-04-30 DIAGNOSIS — N138 Other obstructive and reflux uropathy: Secondary | ICD-10-CM

## 2017-04-30 DIAGNOSIS — N401 Enlarged prostate with lower urinary tract symptoms: Secondary | ICD-10-CM | POA: Diagnosis not present

## 2017-04-30 DIAGNOSIS — I2510 Atherosclerotic heart disease of native coronary artery without angina pectoris: Secondary | ICD-10-CM

## 2017-04-30 DIAGNOSIS — E7800 Pure hypercholesterolemia, unspecified: Secondary | ICD-10-CM | POA: Diagnosis not present

## 2017-04-30 MED ORDER — PANTOPRAZOLE SODIUM 40 MG PO TBEC: 40.0000 mg | DELAYED_RELEASE_TABLET | Freq: Every day | ORAL | 1 refills | Status: DC

## 2017-04-30 NOTE — Patient Instructions (Signed)
Fat and Cholesterol Restricted Diet Getting too much fat and cholesterol in your diet may cause health problems. Following this diet helps keep your fat and cholesterol at normal levels. This can keep you from getting sick. What types of fat should I choose?  Choose monosaturated and polyunsaturated fats. These are found in foods such as olive oil, canola oil, flaxseeds, walnuts, almonds, and seeds.  Eat more omega-3 fats. Good choices include salmon, mackerel, sardines, tuna, flaxseed oil, and ground flaxseeds.  Limit saturated fats. These are in animal products such as meats, butter, and cream. They can also be in plant products such as palm oil, palm kernel oil, and coconut oil.  Avoid foods with partially hydrogenated oils in them. These contain trans fats. Examples of foods that have trans fats are stick margarine, some tub margarines, cookies, crackers, and other baked goods. What general guidelines do I need to follow?  Check food labels. Look for the words "trans fat" and "saturated fat."  When preparing a meal: ? Fill half of your plate with vegetables and green salads. ? Fill one fourth of your plate with whole grains. Look for the word "whole" as the first word in the ingredient list. ? Fill one fourth of your plate with lean protein foods.  Eat more foods that have fiber, like apples, carrots, beans, peas, and barley.  Eat more home-cooked foods. Eat less at restaurants and buffets.  Limit or avoid alcohol.  Limit foods high in starch and sugar.  Limit fried foods.  Cook foods without frying them. Baking, boiling, grilling, and broiling are all great options.  Lose weight if you are overweight. Losing even a small amount of weight can help your overall health. It can also help prevent diseases such as diabetes and heart disease. What foods can I eat? Grains Whole grains, such as whole wheat or whole grain breads, crackers, cereals, and pasta. Unsweetened oatmeal,  bulgur, barley, quinoa, or brown rice. Corn or whole wheat flour tortillas. Vegetables Fresh or frozen vegetables (raw, steamed, roasted, or grilled). Green salads. Fruits All fresh, canned (in natural juice), or frozen fruits. Meat and Other Protein Products Ground beef (85% or leaner), grass-fed beef, or beef trimmed of fat. Skinless chicken or turkey. Ground chicken or turkey. Pork trimmed of fat. All fish and seafood. Eggs. Dried beans, peas, or lentils. Unsalted nuts or seeds. Unsalted canned or dry beans. Dairy Low-fat dairy products, such as skim or 1% milk, 2% or reduced-fat cheeses, low-fat ricotta or cottage cheese, or plain low-fat yogurt. Fats and Oils Tub margarines without trans fats. Light or reduced-fat mayonnaise and salad dressings. Avocado. Olive, canola, sesame, or safflower oils. Natural peanut or almond butter (choose ones without added sugar and oil). The items listed above may not be a complete list of recommended foods or beverages. Contact your dietitian for more options. What foods are not recommended? Grains White bread. White pasta. White rice. Cornbread. Bagels, pastries, and croissants. Crackers that contain trans fat. Vegetables White potatoes. Corn. Creamed or fried vegetables. Vegetables in a cheese sauce. Fruits Dried fruits. Canned fruit in light or heavy syrup. Fruit juice. Meat and Other Protein Products Fatty cuts of meat. Ribs, chicken wings, bacon, sausage, bologna, salami, chitterlings, fatback, hot dogs, bratwurst, and packaged luncheon meats. Liver and organ meats. Dairy Whole or 2% milk, cream, half-and-half, and cream cheese. Whole milk cheeses. Whole-fat or sweetened yogurt. Full-fat cheeses. Nondairy creamers and whipped toppings. Processed cheese, cheese spreads, or cheese curds. Sweets and Desserts Corn   syrup, sugars, honey, and molasses. Candy. Jam and jelly. Syrup. Sweetened cereals. Cookies, pies, cakes, donuts, muffins, and ice  cream. Fats and Oils Butter, stick margarine, lard, shortening, ghee, or bacon fat. Coconut, palm kernel, or palm oils. Beverages Alcohol. Sweetened drinks (such as sodas, lemonade, and fruit drinks or punches). The items listed above may not be a complete list of foods and beverages to avoid. Contact your dietitian for more information. This information is not intended to replace advice given to you by your health care provider. Make sure you discuss any questions you have with your health care provider. Document Released: 07/25/2011 Document Revised: 09/30/2015 Document Reviewed: 04/24/2013 Elsevier Interactive Patient Education  2018 [employer]  

## 2017-04-30 NOTE — Assessment & Plan Note (Signed)
Encouraged him to consume a low fat diet Continue Atorvastatin for now Will check lipid profile at [employer] Exam

## 2017-04-30 NOTE — Assessment & Plan Note (Signed)
Encouraged him to consume a low carb diet Continue Metformin, Glipizide and Levemir Encouraged yearly eye exams He will continue to follow with endocrinology

## 2017-04-30 NOTE — Progress Notes (Signed)
HPI  Pt presents to the clinic today to establish care and for management of the conditions listed below. Antonio [name] is transferring care from Dr. Deborra Medina.  BPH: Controlled on Flomax and Finasteride. Antonio [name] follows with Dr. Ernestine Conrad  HLD with CAD: His last LDL was 53, 07/2016. Antonio [name] is taking Atorvastatin as prescribed. Antonio [name] denies myalgias. Antonio [name] consumes a low fat diet.  DM 2: His last A1C was 8.9%. His sugars range 80-100. Antonio [name] is taking Metformin, Glipizide and Levemir as prescribed. Antonio [name] checks his feet daily. His last eye exam was more than 1 year ago. Antonio [name] follows with Dr. Gabriel Carina.  GERD with Esophageal Stricture: Antonio [name] had an EGD in 2009. Antonio [name] is not taking Pantoprazole because Antonio [name] did not get it refilled. Antonio [name] reports reflux symptoms 1 x week. Antonio [name] denies swallowing issues.  HTN: His BP today is 140/84. Antonio [name] is taking Ramipril and Metoprolol as prescribed. ECG from 06/2014 reviewed.  Flu: never Tetanus: 11/2006 Pneumovax: never Prevnar: never Zostovax: never Shingrix: never PSA Screening: 09/2016 Colon Screening: 07/2014 Vision Screening: annually Dentist: as needed  Past Medical History:  Diagnosis Date  . 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 Coronary atherosclerosis of unspecified type of vessel, native or graft   . Diabetes mellitus    Type II  . Esophageal stricture   . Gastritis   . Hiatal hernia   . Hx of colonic polyps   . Hyperlipidemia   . Hypertension   . Internal hemorrhoids without mention of complication   . Lumbago   . PSA elevation    now averaging 2's  . Tobacco abuse   . URI (upper respiratory infection)   . Urinary retention     Current Outpatient Medications  Medication Sig Dispense Refill  . aspirin 81 MG tablet Take 81 mg by mouth daily.    [name] atorvastatin (LIPITOR) 20 MG tablet Take 1 tablet (20 mg total) by mouth daily. 90 tablet 1  . finasteride (PROSCAR) 5 MG tablet TAKE ONE TABLET BY MOUTH ONCE DAILY 90 tablet 3  . glipiZIDE (GLUCOTROL) 10 MG tablet  Take 10 mg by mouth daily before breakfast.     . glucose blood ([employer] TEST STRIPS) test strip Use one strip three times daily.    . insulin detemir (LEVEMIR) 100 UNIT/ML injection Inject 20 units at bedtime. 10 pen 5  . Insulin Syringe-Needle U-100 (INSULIN SYRINGE .3CC/31GX5/16") 31G X 5/16" 0.3 ML MISC Use as directed. Use once daily.    . metFORMIN (GLUCOPHAGE) 500 MG tablet TAKE TWO TABLETS BY MOUTH TWICE DAILY WITH A MEAL 120 tablet 0  . metoprolol tartrate (LOPRESSOR) 50 MG tablet TAKE 1/2 (ONE-HALF) TABLET BY MOUTH TWICE DAILY 30 tablet 11  . pantoprazole (PROTONIX) 40 MG tablet Take 1 tablet (40 mg total) by mouth daily. 30 tablet 2  . pantoprazole (PROTONIX) 40 MG tablet TAKE ONE TABLET BY MOUTH ONCE DAILY (CALL  [facility]  FOR  FURTHER  REFILLS  AND  MAKE  AN  APPOINTMENT  WITH  DR  Fuller Plan) (Patient not taking: Reported on 10/12/2016) 90 tablet 0  . ramipril (ALTACE) 10 MG capsule Take 1 capsule (10 mg total) by mouth daily. 90 capsule 3  . tamsulosin (FLOMAX) 0.4 MG CAPS capsule TAKE TWO CAPSULES BY MOUTH ONCE DAILY 180 capsule 4   No current facility-administered medications for this visit.     No Known Allergies  Family History  Problem Relation Age of Onset  . Diabetes Father   .  Alcohol abuse Paternal Uncle   . Alcohol abuse Paternal Uncle   . Alcohol abuse Paternal Uncle   . Rheum arthritis Mother   . Heart disease Neg Hx   . Stroke Neg Hx   . Cancer Neg Hx   . Drug abuse Neg Hx   . Depression Neg Hx   . Colon cancer Neg Hx   . Rectal cancer Neg Hx   . Stomach cancer Neg Hx   . Kidney cancer Neg Hx   . Bladder Cancer Neg Hx   . Prostate cancer Neg Hx     Social History   Socioeconomic History  . Marital status: Married    Spouse name: [name]  . Number of children: 3  . Years of education: Not on file  . Highest education level: Not on file  Occupational History  . Occupation: Retired Animal nutritionist  . Financial resource strain: Not on file  .  Food insecurity:    Worry: Not on file    Inability: Not on file  . Transportation needs:    Medical: Not on file    Non-medical: Not on file  Tobacco Use  . Smoking status: Current Every Day Smoker    Packs/day: 1.00    Years: 40.00    Pack years: 40.00    Types: Cigarettes  . Smokeless tobacco: Never Used  Substance and Sexual Activity  . Alcohol use: Yes    Alcohol/week: 0.0 oz    Comment: rarely  . Drug use: No  . Sexual activity: Not on file  Lifestyle  . Physical activity:    Days per week: Not on file    Minutes per session: Not on file  . Stress: Not on file  Relationships  . Social connections:    Talks on phone: Not on file    Gets together: Not on file    Attends religious service: Not on file    Active member of club or organization: Not on file    Attends meetings of clubs or organizations: Not on file    Relationship status: Not on file  . Intimate partner violence:    Fear of current or ex partner: Not on file    Emotionally abused: Not on file    Physically abused: Not on file    Forced sexual activity: Not on file  Other Topics Concern  . Not on file  Social History Narrative   Married 9/09 wife with moderate memory problems.   3 adult children, 2 grandchildren   Would desire CPR    ROS:  Constitutional: Denies fever, malaise, fatigue, headache or abrupt weight changes.  HEENT: Denies eye pain, eye redness, ear pain, ringing in the ears, wax buildup, runny nose, nasal congestion, bloody nose, or sore throat. Respiratory: Denies difficulty breathing, shortness of breath, cough or sputum production.   Cardiovascular: Denies chest pain, chest tightness, palpitations or swelling in the hands or feet.  Gastrointestinal: Denies abdominal pain, bloating, constipation, diarrhea or blood in the stool.  GU: Denies frequency, urgency, pain with urination, blood in urine, odor or discharge. Musculoskeletal: Denies decrease in range of motion, difficulty with  gait, muscle pain or joint pain and swelling.  Skin: Denies redness, rashes, lesions or ulcercations.  Neurological: Denies dizziness, difficulty with memory, difficulty with speech or problems with balance and coordination.  Psych: Denies anxiety, depression, SI/HI.  No other specific complaints in a complete review of systems (except as listed in HPI above).  PE: BP 140/84  Pulse (!) 55   Temp 97.9 F (36.6 C) (Oral)   Ht 5\' 6"  (1.676 m)   Wt 170 lb (77.1 kg)   SpO2 96%   BMI 27.44 kg/m   Wt Readings from Last 3 Encounters:  10/12/16 169 lb (76.7 kg)  07/18/16 170 lb (77.1 kg)  07/12/16 166 lb 8 oz (75.5 kg)    General: Appears his stated age, well developed, well nourished in NAD. Skin: No ulcerations noted. Neck: Neck supple, trachea midline. No masses, lumps or thyromegaly present.  Cardiovascular: Bradycardic with normal rhythm. S1,S2 noted.  No murmur, rubs or gallops noted. No JVD or BLE edema. No carotid bruits noted. Pulmonary/Chest: Normal effort and positive vesicular breath sounds. No respiratory distress. No wheezes, rales or ronchi noted.  Abdomen: Soft and nontender. Normal bowel sounds Musculoskeletal:  No difficulty with gait.  Neurological: Alert and oriented. Sensation intact to BLE.  Psychiatric: Mood and affect normal. Behavior is normal. Judgment and thought content normal.     BMET    Component Value Date/Time   NA 139 07/12/2016 0943   NA 141 07/10/2014 1025   K 4.4 07/12/2016 0943   CL 102 07/12/2016 0943   CO2 29 07/12/2016 0943   GLUCOSE 132 (H) 07/12/2016 0943   BUN 16 07/12/2016 0943   BUN 12 07/10/2014 1025   CREATININE 0.82 07/12/2016 0943   CALCIUM 9.3 07/12/2016 0943   GFRNONAA 92 07/10/2014 1025   GFRAA 106 07/10/2014 1025    Lipid Panel     Component Value Date/Time   CHOL 116 07/12/2016 0943   TRIG 133.0 07/12/2016 0943   HDL 36.80 (L) 07/12/2016 0943   CHOLHDL 3 07/12/2016 0943   VLDL 26.6 07/12/2016 0943   LDLCALC 53  07/12/2016 0943    CBC    Component Value Date/Time   WBC 13.7 (H) 06/26/2014 0435   RBC 5.06 06/26/2014 0435   HGB 14.9 06/26/2014 0435   HCT 43.5 06/26/2014 0435   PLT 291 06/26/2014 0435   MCV 86.0 06/26/2014 0435   MCH 29.4 06/26/2014 0435   MCHC 34.3 06/26/2014 0435   RDW 13.7 06/26/2014 0435   LYMPHSABS 1.9 06/26/2014 0435   MONOABS 0.9 06/26/2014 0435   EOSABS 0.2 06/26/2014 0435   BASOSABS 0.0 06/26/2014 0435    Hgb A1C Lab Results  Component Value Date   HGBA1C 8.9 (H) 07/12/2016     Assessment and Plan:

## 2017-04-30 NOTE — Assessment & Plan Note (Signed)
No angina Discussed better BP control but he declines adjusting medications at this time, reports his BP normally runs 037-096 systolic at home Continue Atorvastatin for now

## 2017-04-30 NOTE — Assessment & Plan Note (Signed)
Controlled on Finasteride and Flomax He will continue to follow with urology

## 2017-04-30 NOTE — Assessment & Plan Note (Signed)
Pantoprazole refilled today

## 2017-04-30 NOTE — Assessment & Plan Note (Signed)
Advised him I would like better BP control but he declines adjusting medication at this time because his BP at home runs 536-468 systolic Reinforced DASH diet Continue Ramipril and Metoprolol for now Will check CMET and Medicare Wellness Exam

## 2017-06-07 ENCOUNTER — Telehealth: Payer: Self-pay | Admitting: Internal Medicine

## 2017-06-07 NOTE — Telephone Encounter (Signed)
Pt is wondering if he can switch to Anda Kraft since that is who his wife has established with.

## 2017-06-07 NOTE — Telephone Encounter (Signed)
Fine with me, thanks. 

## 2017-06-08 NOTE — Telephone Encounter (Signed)
Fine with me

## 2017-06-27 ENCOUNTER — Encounter: Payer: Self-pay | Admitting: Primary Care

## 2017-06-27 ENCOUNTER — Ambulatory Visit (INDEPENDENT_AMBULATORY_CARE_PROVIDER_SITE_OTHER): Payer: Medicare Other | Admitting: Primary Care

## 2017-06-27 ENCOUNTER — Encounter: Payer: Self-pay | Admitting: Gastroenterology

## 2017-06-27 ENCOUNTER — Encounter: Payer: Self-pay | Admitting: *Deleted

## 2017-06-27 VITALS — BP 140/60 | HR 56 | Temp 98.0°F | Ht 66.0 in | Wt 168.5 lb

## 2017-06-27 DIAGNOSIS — I2510 Atherosclerotic heart disease of native coronary artery without angina pectoris: Secondary | ICD-10-CM

## 2017-06-27 DIAGNOSIS — N138 Other obstructive and reflux uropathy: Secondary | ICD-10-CM

## 2017-06-27 DIAGNOSIS — K2100 Gastro-esophageal reflux disease with esophagitis, without bleeding: Secondary | ICD-10-CM

## 2017-06-27 DIAGNOSIS — I10 Essential (primary) hypertension: Secondary | ICD-10-CM

## 2017-06-27 DIAGNOSIS — E11618 Type 2 diabetes mellitus with other diabetic arthropathy: Secondary | ICD-10-CM

## 2017-06-27 DIAGNOSIS — Z794 Long term (current) use of insulin: Secondary | ICD-10-CM | POA: Diagnosis not present

## 2017-06-27 DIAGNOSIS — E785 Hyperlipidemia, unspecified: Secondary | ICD-10-CM | POA: Diagnosis not present

## 2017-06-27 DIAGNOSIS — N401 Enlarged prostate with lower urinary tract symptoms: Secondary | ICD-10-CM | POA: Diagnosis not present

## 2017-06-27 LAB — LIPID PANEL
CHOL/HDL RATIO: 3
Cholesterol: 107 mg/dL (ref 0–200)
HDL: 39.4 mg/dL (ref >39.00)
LDL Cholesterol: 40 mg/dL (ref 0–99)
NonHDL: 68.07
TRIGLYCERIDES: 139 mg/dL (ref 0.0–149.0)
VLDL: 27.8 mg/dL (ref 0.0–40.0)

## 2017-06-27 LAB — COMPREHENSIVE METABOLIC PANEL
ALK PHOS: 61 U/L (ref 39–117)
ALT: 9 U/L (ref 0–53)
AST: 9 U/L (ref 0–37)
Albumin: 3.9 g/dL (ref 3.5–5.2)
BILIRUBIN TOTAL: 0.4 mg/dL (ref 0.2–1.2)
BUN: 19 mg/dL (ref 6–23)
CALCIUM: 9.3 mg/dL (ref 8.4–10.5)
CO2: 27 meq/L (ref 19–32)
Chloride: 105 mEq/L (ref 96–112)
Creatinine, Ser: 0.75 mg/dL (ref 0.40–1.50)
GFR: 108.65 mL/min (ref >60.00)
Glucose, Bld: 210 mg/dL — ABNORMAL HIGH (ref 70–99)
POTASSIUM: 3.9 meq/L (ref 3.5–5.1)
Sodium: 141 mEq/L (ref 135–145)
TOTAL PROTEIN: 6.5 g/dL (ref 6.0–8.3)

## 2017-06-27 NOTE — Assessment & Plan Note (Signed)
Repeat lipid pending today. Continue atorvastatin.

## 2017-06-27 NOTE — Assessment & Plan Note (Signed)
Borderline. Discussed to purchase a cuff and start monitoring at home. Continue current regimen.

## 2017-06-27 NOTE — Assessment & Plan Note (Addendum)
Following with endocrinology and podiatry. Declines pneumonia vaccination today. Continue statin and ACE.  Explained that Levemir is a longer acting insulin and won't quickly decrease glucose readings. Suggested he start checking his glucose levels at different times throughout the day to get a better understanding of his readings.

## 2017-06-27 NOTE — Patient Instructions (Signed)
Stop by the lab prior to leaving today. I will notify you of your results once received.   Follow up with the endocrinologist and urologist as recommended.  Make sure to pick up your pantoprazole tablets for heartburn.   It was a pleasure to see you today!

## 2017-06-27 NOTE — Progress Notes (Signed)
Subjective:    Patient ID: Antonio [name], male    DOB: 08-May-1945, 72 y.o.   MRN: [id number]  HPI  Antonio [name] is a 72 year old male who presents today to transfer care from [facility].  1) Hyperlipidemia: Currently managed on atorvastatin 20 mg. He is due for repeat lipids today.   2) Type 2 Diabetes: He is currently following with endocrinology.  Current medications include: Glipizide 10 mg BID, Levemir 20 units AM, Metformin 1000 mg BID. He doesn't give himself his insulin if his glucose is below 90.   He is checking his blood glucose 3-4  times weekly and is getting readings of: AM fasting: 85-110  Last A1C: 7.6 in February 2019 Last Eye Exam: Due, he will schedule Last Foot Exam: Follows with podiatry Pneumonia Vaccination: Due, he declines ACE/ARB: Ramipril  Statin: Atorvastatin   3) Essential Hypertension: Currently managed on ramipril 10 mg and metoprolol tartrate 50 mg BID. He doesn't check his BP at home. He denies chest pain, dizziness, shortness of breath.   BP Readings from Last 3 Encounters:  06/27/17 140/60  04/30/17 140/84  10/12/16 (!) 162/75    4) GERD: Currently managed on pantoprazole 40 mg. He's not picked up his pantoprazole 40 mg tablets since they were ordered in March. He's struggling with esophageal burning, mostly at night. He sleeps with his bed elevated.   5) BPH: Currently managed on finasteride 5 mg and tamsulosin 0.4 mg. He is following with Urology annually. PSA from 2018 normal.    Review of Systems  Eyes: Negative for visual disturbance.  Respiratory: Negative for shortness of breath.   Cardiovascular: Negative for chest pain.  Genitourinary: Negative for difficulty urinating.  Musculoskeletal: Negative for myalgias.  Neurological: Negative for dizziness and headaches.       Past Medical History:  Diagnosis Date  . 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 Coronary atherosclerosis of unspecified type of  vessel, native or graft   . Diabetes mellitus    Type II  . Esophageal stricture   . Gastritis   . Hiatal hernia   . Hx of colonic polyps   . Hyperlipidemia   . Hypertension   . Internal hemorrhoids without mention of complication   . Lumbago   . PSA elevation    now averaging 2's  . Tobacco abuse   . URI (upper respiratory infection)   . Urinary retention      Social History   Socioeconomic History  . Marital status: Married    Spouse name: Antonio [name]  . Number of children: 3  . Years of education: Not on file  . Highest education level: Not on file  Occupational History  . Occupation: Retired Animal nutritionist  . Financial resource strain: Not on file  . Food insecurity:    Worry: Not on file    Inability: Not on file  . Transportation needs:    Medical: Not on file    Non-medical: Not on file  Tobacco Use  . Smoking status: Current Every Day Smoker    Packs/day: 1.50    Years: 40.00    Pack years: 60.00    Types: Cigarettes  . Smokeless tobacco: Never Used  Substance and Sexual Activity  . Alcohol use: Yes    Alcohol/week: 0.0 oz    Comment: rare  . Drug use: No  . Sexual activity: Not on file  Lifestyle  . Physical activity:  Days per week: Not on file    Minutes per session: Not on file  . Stress: Not on file  Relationships  . Social connections:    Talks on phone: Not on file    Gets together: Not on file    Attends religious service: Not on file    Active member of club or organization: Not on file    Attends meetings of clubs or organizations: Not on file    Relationship status: Not on file  . Intimate partner violence:    Fear of current or ex partner: Not on file    Emotionally abused: Not on file    Physically abused: Not on file    Forced sexual activity: Not on file  Other Topics Concern  . Not on file  Social History Narrative   Married 9/09 wife with moderate memory problems.   3 adult children, 2 grandchildren   Would desire  CPR    Past Surgical History:  Procedure Laterality Date  . 2D Echo  06/03  . Abdominal ultrasound  06/03   Negative  . CT of head  and sinuses  06/03   Negative  . CT of the chest, abdomen, pelvis  06/03   Chest negative; Abdomen, left adrenal lesion; Pelvis, enlarged prostate  . ESOPHAGOGASTRODUODENOSCOPY  02/13/07   gastritis and duodenitis without bleed  . ESOPHAGOGASTRODUODENOSCOPY N/A 06/26/2014   Procedure: ESOPHAGOGASTRODUODENOSCOPY (EGD);  Surgeon: Milus Banister, MD;  Location: [facility];  Service: Endoscopy;  Laterality: N/A;  . KNEE ARTHROSCOPY  10/00   Right  . LP  06/03  . Microwave thermotherapy prostate  08/28/07   Rogers Blocker  . Persantine cardiolite  03/01   EF 68%  . POLYPECTOMY  11/95  . Stress myoview  07/06   EF 67%    Family History  Problem Relation Age of Onset  . Diabetes Father   . Alcohol abuse Paternal Uncle   . Alcohol abuse Paternal Uncle   . Alcohol abuse Paternal Uncle   . Rheum arthritis Mother   . Heart disease Neg Hx   . Stroke Neg Hx   . Cancer Neg Hx   . Drug abuse Neg Hx   . Depression Neg Hx   . Colon cancer Neg Hx   . Rectal cancer Neg Hx   . Stomach cancer Neg Hx   . Kidney cancer Neg Hx   . Bladder Cancer Neg Hx   . Prostate cancer Neg Hx     No Known Allergies  Current Outpatient Medications on File Prior to Visit  Medication Sig Dispense Refill  . aspirin 81 MG tablet Take 81 mg by mouth daily.    [name] atorvastatin (LIPITOR) 20 MG tablet Take 1 tablet (20 mg total) by mouth daily. 90 tablet 1  . finasteride (PROSCAR) 5 MG tablet TAKE ONE TABLET BY MOUTH ONCE DAILY 90 tablet 3  . glipiZIDE (GLUCOTROL) 10 MG tablet Take 10 mg by mouth daily before breakfast.     . glucose blood ([employer] TEST STRIPS) test strip Use one strip three times daily.    . insulin detemir (LEVEMIR) 100 UNIT/ML injection Inject 20 units at bedtime. 10 pen 5  . metFORMIN (GLUCOPHAGE) 500 MG tablet TAKE TWO TABLETS BY MOUTH TWICE DAILY WITH A MEAL 120  tablet 0  . metoprolol tartrate (LOPRESSOR) 50 MG tablet TAKE 1/2 (ONE-HALF) TABLET BY MOUTH TWICE DAILY 30 tablet 11  . pantoprazole (PROTONIX) 40 MG tablet Take 1 tablet (40 mg total) by  mouth daily. 90 tablet 1  . ramipril (ALTACE) 10 MG capsule Take 1 capsule (10 mg total) by mouth daily. 90 capsule 3  . tamsulosin (FLOMAX) 0.4 MG CAPS capsule TAKE TWO CAPSULES BY MOUTH ONCE DAILY 180 capsule 4   No current facility-administered medications on file prior to visit.     BP 140/60   Pulse (!) 56   Temp 98 F (36.7 C) (Oral)   Ht 5\' 6"  (1.676 m)   Wt 168 lb 8 oz (76.4 kg)   SpO2 98%   BMI 27.20 kg/m    Objective:   Physical Exam  Constitutional: He is oriented to person, place, and time. He appears well-nourished.  Neck: Neck supple.  Cardiovascular: Normal rate and regular rhythm.  Pulmonary/Chest: Effort normal and breath sounds normal. He has no wheezes. He has no rales.  Neurological: He is alert and oriented to person, place, and time.  Skin: Skin is warm and dry.  Psychiatric: He has a normal mood and affect.          Assessment & Plan:

## 2017-06-27 NOTE — Assessment & Plan Note (Signed)
Following with Urology, continue current regimen. Due for re-evaluation in September 2019.

## 2017-06-27 NOTE — Assessment & Plan Note (Signed)
Discussed to pick up pantoprazole Rx from the pharmacy. Also discussed triggers of GERD.

## 2017-07-13 DIAGNOSIS — E1129 Type 2 diabetes mellitus with other diabetic kidney complication: Secondary | ICD-10-CM | POA: Diagnosis not present

## 2017-07-13 DIAGNOSIS — R809 Proteinuria, unspecified: Secondary | ICD-10-CM | POA: Diagnosis not present

## 2017-07-13 DIAGNOSIS — Z794 Long term (current) use of insulin: Secondary | ICD-10-CM | POA: Diagnosis not present

## 2017-07-16 ENCOUNTER — Other Ambulatory Visit: Payer: Self-pay | Admitting: Family Medicine

## 2017-07-16 DIAGNOSIS — I10 Essential (primary) hypertension: Secondary | ICD-10-CM

## 2017-07-16 DIAGNOSIS — E782 Mixed hyperlipidemia: Secondary | ICD-10-CM

## 2017-07-19 ENCOUNTER — Encounter: Payer: Self-pay | Admitting: Podiatry

## 2017-07-19 ENCOUNTER — Ambulatory Visit: Payer: Medicare Other

## 2017-07-19 ENCOUNTER — Ambulatory Visit (INDEPENDENT_AMBULATORY_CARE_PROVIDER_SITE_OTHER): Payer: Medicare Other | Admitting: Podiatry

## 2017-07-19 DIAGNOSIS — M79609 Pain in unspecified limb: Secondary | ICD-10-CM | POA: Diagnosis not present

## 2017-07-19 DIAGNOSIS — B351 Tinea unguium: Secondary | ICD-10-CM | POA: Diagnosis not present

## 2017-07-19 DIAGNOSIS — E1142 Type 2 diabetes mellitus with diabetic polyneuropathy: Secondary | ICD-10-CM

## 2017-07-19 NOTE — Progress Notes (Signed)
Complaint:  Visit Type: Patient returns to my office for continued preventative foot care services. Complaint: Patient states" my nails have grown long and thick and become painful to walk and wear shoes" Patient has been diagnosed with DM with neuropathy. The patient presents for preventative foot care services. No changes to ROS  Podiatric Exam: Vascular: dorsalis pedis and posterior tibial pulses are barely palpable bilateral. Capillary return is immediate. Temperature gradient is WNL. Skin turgor WNL  Sensorium: Diminished Semmes Weinstein monofilament test. Normal tactile sensation bilaterally. Nail Exam: Pt has thick disfigured discolored nails with subungual debris noted bilateral entire nail hallux through fifth toenails Ulcer Exam: There is no evidence of ulcer or pre-ulcerative changes or infection. Orthopedic Exam: Muscle tone and strength are WNL. No limitations in general ROM. No crepitus or effusions noted. Foot type and digits show no abnormalities. DJD Midfoot  B/L Skin: No Porokeratosis. No infection or ulcers  Diagnosis:  Onychomycosis, , Pain in right toe, pain in left toes  Treatment & Plan Procedures and Treatment: Consent by patient was obtained for treatment procedures. The patient understood the discussion of treatment and procedures well. All questions were answered thoroughly reviewed. Debridement of mycotic and hypertrophic toenails, 1 through 5 bilateral and clearing of subungual debris. No ulceration, no infection noted. ABN signed for 2019. Return Visit-Office Procedure: Patient instructed to return to the office for a follow up visit 3 months for continued evaluation and treatment.    Gardiner Barefoot DPM

## 2017-07-20 DIAGNOSIS — F17200 Nicotine dependence, unspecified, uncomplicated: Secondary | ICD-10-CM | POA: Diagnosis not present

## 2017-07-20 DIAGNOSIS — R809 Proteinuria, unspecified: Secondary | ICD-10-CM | POA: Diagnosis not present

## 2017-07-20 DIAGNOSIS — E1129 Type 2 diabetes mellitus with other diabetic kidney complication: Secondary | ICD-10-CM | POA: Diagnosis not present

## 2017-07-20 DIAGNOSIS — I10 Essential (primary) hypertension: Secondary | ICD-10-CM | POA: Diagnosis not present

## 2017-07-20 DIAGNOSIS — Z794 Long term (current) use of insulin: Secondary | ICD-10-CM | POA: Diagnosis not present

## 2017-07-24 ENCOUNTER — Ambulatory Visit: Payer: Medicare Other | Admitting: Family Medicine

## 2017-07-24 ENCOUNTER — Ambulatory Visit: Payer: Medicare Other | Admitting: Internal Medicine

## 2017-08-23 ENCOUNTER — Ambulatory Visit: Payer: Medicare Other | Admitting: Internal Medicine

## 2017-08-31 DIAGNOSIS — H2513 Age-related nuclear cataract, bilateral: Secondary | ICD-10-CM | POA: Diagnosis not present

## 2017-08-31 LAB — HM DIABETES EYE EXAM

## 2017-09-05 ENCOUNTER — Encounter: Payer: Self-pay | Admitting: Primary Care

## 2017-09-08 ENCOUNTER — Other Ambulatory Visit: Payer: Self-pay | Admitting: Primary Care

## 2017-09-08 DIAGNOSIS — Z794 Long term (current) use of insulin: Principal | ICD-10-CM

## 2017-09-08 DIAGNOSIS — E11618 Type 2 diabetes mellitus with other diabetic arthropathy: Secondary | ICD-10-CM

## 2017-09-14 ENCOUNTER — Ambulatory Visit (INDEPENDENT_AMBULATORY_CARE_PROVIDER_SITE_OTHER): Payer: Medicare Other

## 2017-09-14 VITALS — BP 150/70 | HR 47 | Temp 97.7°F | Ht 66.75 in | Wt 168.5 lb

## 2017-09-14 DIAGNOSIS — Z0001 Encounter for general adult medical examination with abnormal findings: Secondary | ICD-10-CM

## 2017-09-14 DIAGNOSIS — E11618 Type 2 diabetes mellitus with other diabetic arthropathy: Secondary | ICD-10-CM | POA: Diagnosis not present

## 2017-09-14 DIAGNOSIS — I10 Essential (primary) hypertension: Secondary | ICD-10-CM

## 2017-09-14 DIAGNOSIS — Z794 Long term (current) use of insulin: Secondary | ICD-10-CM

## 2017-09-14 LAB — POCT GLYCOSYLATED HEMOGLOBIN (HGB A1C): Hemoglobin A1C: 6.8 % — AB (ref 4.0–5.6)

## 2017-09-14 LAB — TSH: TSH: 1.11 u[IU]/mL (ref 0.35–4.50)

## 2017-09-14 NOTE — Patient Instructions (Signed)
Antonio [name] , Thank you for taking time to come for your Medicare Wellness Visit. I appreciate your ongoing commitment to your health goals. Please review the following plan we discussed and let me know if I can assist you in the future.   These are the goals we discussed: Goals    . Increase water intake     Starting 09/14/2017, I will continue to drink at least 6-8 glasses of water daily.        This is a list of the screening recommended for you and due dates:  Health Maintenance  Topic Date Due  . Complete foot exam   09/17/2017*  . Colon Cancer Screening  08/06/2018*  . Tetanus Vaccine  09/15/2018*  . Pneumonia vaccines (1 of 2 - PCV13) 09/15/2018*  . Flu Shot  01/21/2057*  . Hemoglobin A1C  03/17/2018  . Eye exam for diabetics  09/01/2018  .  Hepatitis C: One time screening is recommended by Center for Disease Control  (CDC) for  adults born from [birth year] through 1966.   Completed  *Topic was postponed. The date shown is not the original due date.   Preventive Care for Adults  A healthy lifestyle and preventive care can promote health and wellness. Preventive health guidelines for adults include the following key practices.  . A routine yearly physical is a good way to check with your health care provider about your health and preventive screening. It is a chance to share any concerns and updates on your health and to receive a thorough exam.  . Visit your dentist for a routine exam and preventive care every 6 months. Brush your teeth twice a day and floss once a day. Good oral hygiene prevents tooth decay and gum disease.  . The frequency of eye exams is based on your age, health, family medical history, use  of contact lenses, and other factors. Follow your health care provider's recommendations for frequency of eye exams.  . Eat a healthy diet. Foods like vegetables, fruits, whole grains, low-fat dairy products, and lean protein foods contain the nutrients you need without too  many calories. Decrease your intake of foods high in solid fats, added sugars, and salt. Eat the right amount of calories for you. Get information about a proper diet from your health care provider, if necessary.  . Regular physical exercise is one of the most important things you can do for your health. Most adults should get at least 150 minutes of moderate-intensity exercise (any activity that increases your heart rate and causes you to sweat) each week. In addition, most adults need muscle-strengthening exercises on 2 or more days a week.  Silver Sneakers may be a benefit available to you. To determine eligibility, you may visit the website: www.silversneakers.com or contact program at (820)864-3173 Mon-Fri between 8AM-8PM.   . Maintain a healthy weight. The body mass index (BMI) is a screening tool to identify possible weight problems. It provides an estimate of body fat based on height and weight. Your health care provider can find your BMI and can help you achieve or maintain a healthy weight.   For adults 20 years and older: ? A BMI below 18.5 is considered underweight. ? A BMI of 18.5 to 24.9 is normal. ? A BMI of 25 to 29.9 is considered overweight. ? A BMI of 30 and above is considered obese.   . Maintain normal blood lipids and cholesterol levels by exercising and minimizing your intake of saturated fat. Eat a  balanced diet with plenty of fruit and vegetables. Blood tests for lipids and cholesterol should begin at age 2 and be repeated every 5 years. If your lipid or cholesterol levels are high, you are over 50, or you are at high risk for heart disease, you may need your cholesterol levels checked more frequently. Ongoing high lipid and cholesterol levels should be treated with medicines if diet and exercise are not working.  . If you smoke, find out from your health care provider how to quit. If you do not use tobacco, please do not start.  . If you choose to drink alcohol, please  do not consume more than 2 drinks per day. One drink is considered to be 12 ounces (355 mL) of beer, 5 ounces (148 mL) of wine, or 1.5 ounces (44 mL) of liquor.  . If you are 85-68 years old, ask your health care provider if you should take aspirin to prevent strokes.  . Use sunscreen. Apply sunscreen liberally and repeatedly throughout the day. You should seek shade when your shadow is shorter than you. Protect yourself by wearing long sleeves, pants, a wide-brimmed hat, and sunglasses year round, whenever you are outdoors.  . Once a month, do a whole body skin exam, using a mirror to look at the skin on your back. Tell your health care provider of new moles, moles that have irregular borders, moles that are larger than a pencil eraser, or moles that have changed in shape or color.

## 2017-09-17 ENCOUNTER — Ambulatory Visit (INDEPENDENT_AMBULATORY_CARE_PROVIDER_SITE_OTHER): Payer: Medicare Other | Admitting: Primary Care

## 2017-09-17 ENCOUNTER — Encounter: Payer: Medicare Other | Admitting: Primary Care

## 2017-09-17 ENCOUNTER — Encounter: Payer: Self-pay | Admitting: Primary Care

## 2017-09-17 VITALS — BP 140/72 | HR 55 | Temp 98.2°F | Ht 66.75 in | Wt 168.8 lb

## 2017-09-17 DIAGNOSIS — K2100 Gastro-esophageal reflux disease with esophagitis, without bleeding: Secondary | ICD-10-CM

## 2017-09-17 DIAGNOSIS — Z794 Long term (current) use of insulin: Secondary | ICD-10-CM | POA: Diagnosis not present

## 2017-09-17 DIAGNOSIS — I2510 Atherosclerotic heart disease of native coronary artery without angina pectoris: Secondary | ICD-10-CM | POA: Diagnosis not present

## 2017-09-17 DIAGNOSIS — E782 Mixed hyperlipidemia: Secondary | ICD-10-CM

## 2017-09-17 DIAGNOSIS — I10 Essential (primary) hypertension: Secondary | ICD-10-CM | POA: Diagnosis not present

## 2017-09-17 DIAGNOSIS — N138 Other obstructive and reflux uropathy: Secondary | ICD-10-CM | POA: Diagnosis not present

## 2017-09-17 DIAGNOSIS — N401 Enlarged prostate with lower urinary tract symptoms: Secondary | ICD-10-CM

## 2017-09-17 DIAGNOSIS — E11618 Type 2 diabetes mellitus with other diabetic arthropathy: Secondary | ICD-10-CM

## 2017-09-17 MED ORDER — METFORMIN HCL 500 MG PO TABS: ORAL_TABLET | ORAL | 3 refills | Status: DC

## 2017-09-17 MED ORDER — ATORVASTATIN CALCIUM 20 MG PO TABS: ORAL_TABLET | ORAL | 3 refills | Status: DC

## 2017-09-17 MED ORDER — PANTOPRAZOLE SODIUM 40 MG PO TBEC: 40.0000 mg | DELAYED_RELEASE_TABLET | Freq: Every day | ORAL | 3 refills | Status: DC

## 2017-09-17 MED ORDER — METOPROLOL TARTRATE 50 MG PO TABS
ORAL_TABLET | ORAL | 3 refills | Status: DC
Start: 2017-09-17 — End: 2018-03-22

## 2017-09-17 NOTE — Patient Instructions (Signed)
Start exercising. You should be getting 150 minutes of exercise weekly.  It's important to improve your diet by reducing consumption of fast food, fried food, processed snack foods. Increase consumption of fresh vegetables and fruits, whole grains, water.  Ensure you are drinking 64 ounces of water daily.  Please think about the pneumonia shot!  Follow up with endocrinology and urology as scheduled.  Follow up in 1 year for your annual exam or sooner if needed.  It was a pleasure to see you today!

## 2017-09-17 NOTE — Assessment & Plan Note (Signed)
Stable in the office today, continue current regimen. BMP is up to date.

## 2017-09-17 NOTE — Assessment & Plan Note (Signed)
Repeat PSA due next month, following with Urology

## 2017-09-17 NOTE — Assessment & Plan Note (Signed)
Recent LDL at goal, continue atorvastatin.

## 2017-09-17 NOTE — Assessment & Plan Note (Signed)
Stable, continue current regimen 

## 2017-09-17 NOTE — Assessment & Plan Note (Signed)
Managed on aspirin and statin. Encouraged to quit smoking. LDL at goal.

## 2017-09-17 NOTE — Progress Notes (Signed)
Subjective:    Patient ID: Antonio [name], male    DOB: Nov 16, 1945, 72 y.o.   MRN: [id number]  HPI  Mr. Catterton is a 72 year old male who presents today for MVV Part 2. He saw our health advisor last week.   1) Essential Hypertension: Currently managed on metoprolol tartrate 25 mg BID, ramipril 10 mg. He denies chest pain, shortness of breath, dizziness.   BP Readings from Last 3 Encounters:  09/17/17 140/72  09/14/17 (!) 150/70  06/27/17 140/60   2) Type 2 Diabetes:   Current medications include: Currently managed on glipizide 10 mg HS, metformin 1000 mg BID, Levemir 20 units AM. He is currently managed per endocrinology.   He is checking his blood glucose 4-5 times daily and is getting readings of: AM Fasting: 120's  Last A1C: 6.8 Last Eye Exam: Completed in July 2019 Last Foot Exam: Due this September per endocrinology  Pneumonia Vaccination: Declines ACE/ARB: ACE Statin: Lipitor  Diet currently consists of:  Breakfast: Sausage biscuit, bacon/pancakes, sausage with gravy, fast food Lunch: Sandwich, cereal  Dinner: Meat, vegetable  Snacks: Occasionally chips, pretzels  Desserts: 2-3 times weekly  Beverages: Water, diet soda, coffee  Exercise: He is not exercising   3) Hyperlipidemia: Currently managed on atorvastatin 20 mg. Last lipid panel with LDL of 40.   4) BPH: Currently managed on finasteride 5 mg and tamsulosin 0.4 mg. Currently following with Urology, will undergo repeat PSA.  Review of Systems  Constitutional: Negative for unexpected weight change.  HENT: Negative for rhinorrhea.   Eyes: Negative for visual disturbance.  Respiratory: Negative for cough and shortness of breath.   Cardiovascular: Negative for chest pain.  Gastrointestinal: Negative for constipation and diarrhea.  Genitourinary: Positive for difficulty urinating.       Chronically difficulty with emptying bladder.   Musculoskeletal: Negative for arthralgias and myalgias.  Skin: Negative  for rash.  Allergic/Immunologic: Negative for environmental allergies.  Neurological: Negative for dizziness, numbness and headaches.       Past Medical History:  Diagnosis Date  . 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 Coronary atherosclerosis of unspecified type of vessel, native or graft   . Diabetes mellitus    Type II  . Esophageal stricture   . Gastritis   . Hiatal hernia   . Hx of colonic polyps   . Hyperlipidemia   . Hypertension   . Internal hemorrhoids without mention of complication   . Lumbago   . PSA elevation    now averaging 2's  . Tobacco abuse   . URI (upper respiratory infection)   . Urinary retention      Social History   Socioeconomic History  . Marital status: Married    Spouse name: [name]  . Number of children: 3  . Years of education: Not on file  . Highest education level: Not on file  Occupational History  . Occupation: Retired Animal nutritionist  . Financial resource strain: Not on file  . Food insecurity:    Worry: Not on file    Inability: Not on file  . Transportation needs:    Medical: Not on file    Non-medical: Not on file  Tobacco Use  . Smoking status: Current Every Day Smoker    Packs/day: 1.50    Years: 40.00    Pack years: 60.00    Types: Cigarettes  . Smokeless tobacco: Never Used  Substance and Sexual Activity  .  Alcohol use: Yes    Alcohol/week: 0.0 standard drinks    Comment: rare  . Drug use: No  . Sexual activity: Not on file  Lifestyle  . Physical activity:    Days per week: Not on file    Minutes per session: Not on file  . Stress: Not on file  Relationships  . Social connections:    Talks on phone: Not on file    Gets together: Not on file    Attends religious service: Not on file    Active member of club or organization: Not on file    Attends meetings of clubs or organizations: Not on file    Relationship status: Not on file  . Intimate partner violence:    Fear of  current or ex partner: Not on file    Emotionally abused: Not on file    Physically abused: Not on file    Forced sexual activity: Not on file  Other Topics Concern  . Not on file  Social History Narrative   Married 9/09 wife with moderate memory problems.   3 adult children, 2 grandchildren   Would desire CPR    Past Surgical History:  Procedure Laterality Date  . 2D Echo  06/03  . Abdominal ultrasound  06/03   Negative  . CT of head  and sinuses  06/03   Negative  . CT of the chest, abdomen, pelvis  06/03   Chest negative; Abdomen, left adrenal lesion; Pelvis, enlarged prostate  . ESOPHAGOGASTRODUODENOSCOPY  02/13/07   gastritis and duodenitis without bleed  . ESOPHAGOGASTRODUODENOSCOPY N/A 06/26/2014   Procedure: ESOPHAGOGASTRODUODENOSCOPY (EGD);  Surgeon: Milus Banister, MD;  Location: [facility];  Service: Endoscopy;  Laterality: N/A;  . KNEE ARTHROSCOPY  10/00   Right  . LP  06/03  . Microwave thermotherapy prostate  08/28/07   Rogers Blocker  . Persantine cardiolite  03/01   EF 68%  . POLYPECTOMY  11/95  . Stress myoview  07/06   EF 67%    Family History  Problem Relation Age of Onset  . Diabetes Father   . Alcohol abuse Paternal Uncle   . Alcohol abuse Paternal Uncle   . Alcohol abuse Paternal Uncle   . Rheum arthritis Mother   . Heart disease Neg Hx   . Stroke Neg Hx   . Cancer Neg Hx   . Drug abuse Neg Hx   . Depression Neg Hx   . Colon cancer Neg Hx   . Rectal cancer Neg Hx   . Stomach cancer Neg Hx   . Kidney cancer Neg Hx   . Bladder Cancer Neg Hx   . Prostate cancer Neg Hx     No Known Allergies  Current Outpatient Medications on File Prior to Visit  Medication Sig Dispense Refill  . aspirin 81 MG tablet Take 81 mg by mouth daily.    . finasteride (PROSCAR) 5 MG tablet TAKE ONE TABLET BY MOUTH ONCE DAILY 90 tablet 3  . glipiZIDE (GLUCOTROL) 10 MG tablet Take 10 mg by mouth daily before supper.     [name] glucose blood ([employer] TEST STRIPS) test strip Use  one strip three times daily.    . insulin detemir (LEVEMIR) 100 UNIT/ML injection Inject 20 units at bedtime. (Patient taking differently: every morning. Inject 20 units at bedtime.) 10 pen 5  . ramipril (ALTACE) 10 MG capsule Take 1 tablet by mouth once daily for blood pressure. 90 capsule 3  . tamsulosin (FLOMAX) 0.4 MG  CAPS capsule TAKE TWO CAPSULES BY MOUTH ONCE DAILY 180 capsule 4   No current facility-administered medications on file prior to visit.     BP 140/72   Pulse (!) 55   Temp 98.2 F (36.8 C) (Oral)   Ht 5' 6.75" (1.695 m)   Wt 168 lb 12 oz (76.5 kg)   SpO2 97%   BMI 26.63 kg/m    Objective:   Physical Exam  Constitutional: He is oriented to person, place, and time. He appears well-nourished.  HENT:  Mouth/Throat: No oropharyngeal exudate.  Eyes: Pupils are equal, round, and reactive to light. EOM are normal.  Neck: Neck supple. No thyromegaly present.  Cardiovascular: Normal rate and regular rhythm.  Respiratory: Effort normal and breath sounds normal.  GI: Soft. Bowel sounds are normal. There is no tenderness.  Musculoskeletal: Normal range of motion.  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  Skin: Skin is warm and dry.  Psychiatric: He has a normal mood and affect.           Assessment & Plan:

## 2017-09-17 NOTE — Assessment & Plan Note (Signed)
Recent A1C of 6.8, following with endocrinology.  Continue current regimen. Strongly recommended pneumonia vaccination, he declines but will think about this.

## 2017-10-03 ENCOUNTER — Other Ambulatory Visit: Payer: Self-pay

## 2017-10-03 DIAGNOSIS — N138 Other obstructive and reflux uropathy: Secondary | ICD-10-CM

## 2017-10-03 DIAGNOSIS — Z87898 Personal history of other specified conditions: Secondary | ICD-10-CM

## 2017-10-03 DIAGNOSIS — N401 Enlarged prostate with lower urinary tract symptoms: Secondary | ICD-10-CM

## 2017-10-04 ENCOUNTER — Other Ambulatory Visit: Payer: Medicare Other

## 2017-10-04 DIAGNOSIS — N401 Enlarged prostate with lower urinary tract symptoms: Secondary | ICD-10-CM | POA: Diagnosis not present

## 2017-10-04 DIAGNOSIS — N138 Other obstructive and reflux uropathy: Secondary | ICD-10-CM | POA: Diagnosis not present

## 2017-10-05 LAB — PSA: Prostate Specific Ag, Serum: 0.6 ng/mL (ref 0.0–4.0)

## 2017-10-10 ENCOUNTER — Ambulatory Visit (INDEPENDENT_AMBULATORY_CARE_PROVIDER_SITE_OTHER): Payer: Medicare Other | Admitting: Urology

## 2017-10-10 ENCOUNTER — Encounter: Payer: Self-pay | Admitting: Urology

## 2017-10-10 VITALS — BP 138/58 | HR 56 | Wt 169.4 lb

## 2017-10-10 DIAGNOSIS — Z87898 Personal history of other specified conditions: Secondary | ICD-10-CM

## 2017-10-10 DIAGNOSIS — I2510 Atherosclerotic heart disease of native coronary artery without angina pectoris: Secondary | ICD-10-CM

## 2017-10-10 DIAGNOSIS — N401 Enlarged prostate with lower urinary tract symptoms: Secondary | ICD-10-CM

## 2017-10-10 DIAGNOSIS — N138 Other obstructive and reflux uropathy: Secondary | ICD-10-CM

## 2017-10-10 LAB — BLADDER SCAN AMB NON-IMAGING: Scan Result: 51

## 2017-10-10 MED ORDER — FINASTERIDE 5 MG PO TABS: 5.0000 mg | ORAL_TABLET | Freq: Every day | ORAL | 3 refills | Status: DC

## 2017-10-10 MED ORDER — TAMSULOSIN HCL 0.4 MG PO CAPS: ORAL_CAPSULE | ORAL | 4 refills | Status: DC

## 2017-10-10 NOTE — Progress Notes (Signed)
8:52 AM   TYSHAWN CIULLO Jan 02, 1946 [id number]  Referring provider: Lucille Passy, MD [place], [place] [address]  Chief Complaint  Patient presents with  . Benign Prostatic Hypertrophy    HPI: Patient is a 72 year old Caucasian male with a history of urinary retention when he consumes alcohol and has been managing his retention issues with CIC when it occurs and BPH with LUTS who presents today for a one year follow up.   History of urinary retention His baseline urinary symptoms are frequency, urgency, nocturia, intermittency and weak urinary stream. He is on maximum medical therapy with tamsulosin 0.8 mg daily and finasteride 5 mg daily.  His urinary symptoms seemed to worsen when he consumes alcohol and he is comfortable with CIC.  He is having to CIC x 3 weekly.  His PVR was 51 mL today.  BPH WITH LUTS  (prostate and/or bladder) IPSS score: 16/3  PVR: 51 mL    Previous score: 16/3  Previous PVR: 38 mL  Major complaint(s):  Urgency and nocturia x 1-2 x several years. Denies any dysuria, hematuria or suprapubic pain.   Currently taking: Tamsulosin 0.4 mg 2 capsules daily and finasteride 5 mg daily  His has had TARGIS ~ 10 years ago   Denies any recent fevers, chills, nausea or vomiting.  He does not have a family history of PCa.  IPSS    Row Name 10/10/17 0800         International Prostate Symptom Score   How often have you had the sensation of not emptying your bladder?  About half the time     How often have you had to urinate less than every two hours?  About half the time     How often have you found you stopped and started again several times when you urinated?  Less than half the time     How often have you found it difficult to postpone urination?  About half the time     How often have you had a weak urinary stream?  Less than half the time     How often have you had to strain to start urination?  Less than 1 in 5 times     How many  times did you typically get up at night to urinate?  2 Times     Total IPSS Score  16       Quality of Life due to urinary symptoms   If you were to spend the rest of your life with your urinary condition just the way it is now how would you feel about that?  Mixed        Score:  1-7 Mild 8-19 Moderate 20-35 Severe     PMH: Past Medical History:  Diagnosis Date  . 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 Coronary atherosclerosis of unspecified type of vessel, native or graft   . Diabetes mellitus    Type II  . Esophageal stricture   . Gastritis   . Hiatal hernia   . Hx of colonic polyps   . Hyperlipidemia   . Hypertension   . Internal hemorrhoids without mention of complication   . Lumbago   . PSA elevation    now averaging 2's  . Tobacco abuse   . URI (upper respiratory infection)   . Urinary retention     Surgical History: Past Surgical History:  Procedure Laterality Date  .  2D Echo  06/03  . Abdominal ultrasound  06/03   Negative  . CT of head  and sinuses  06/03   Negative  . CT of the chest, abdomen, pelvis  06/03   Chest negative; Abdomen, left adrenal lesion; Pelvis, enlarged prostate  . ESOPHAGOGASTRODUODENOSCOPY  02/13/07   gastritis and duodenitis without bleed  . ESOPHAGOGASTRODUODENOSCOPY N/A 06/26/2014   Procedure: ESOPHAGOGASTRODUODENOSCOPY (EGD);  Surgeon: Milus Banister, MD;  Location: [facility];  Service: Endoscopy;  Laterality: N/A;  . KNEE ARTHROSCOPY  10/00   Right  . LP  06/03  . Microwave thermotherapy prostate  08/28/07   Rogers Blocker  . Persantine cardiolite  03/01   EF 68%  . POLYPECTOMY  11/95  . Stress myoview  07/06   EF 67%    Home Medications:  Allergies as of 10/10/2017   No Known Allergies     Medication List        Accurate as of 10/10/17  8:52 AM. Always use your most recent med list.          aspirin 81 MG tablet Take 81 mg by mouth daily.   atorvastatin 20 MG tablet Commonly known as:   LIPITOR Take 1 tablet by mouth every evening for cholesterol.   finasteride 5 MG tablet Commonly known as:  PROSCAR Take 1 tablet (5 mg total) by mouth daily.   glipiZIDE 10 MG tablet Commonly known as:  GLUCOTROL Take 10 mg by mouth daily before supper.   insulin detemir 100 UNIT/ML injection Commonly known as:  LEVEMIR Inject 20 units at bedtime.   [employer] TEST STRIPS test strip Generic drug:  glucose blood Use one strip three times daily.   metFORMIN 500 MG tablet Commonly known as:  GLUCOPHAGE TAKE TWO TABLETS BY MOUTH TWICE DAILY WITH A MEAL for diabetes.   metoprolol tartrate 50 MG tablet Commonly known as:  LOPRESSOR TAKE 1/2 (ONE-HALF) TABLET BY MOUTH TWICE DAILY   pantoprazole 40 MG tablet Commonly known as:  PROTONIX Take 1 tablet (40 mg total) by mouth daily.   ramipril 10 MG capsule Commonly known as:  ALTACE Take 1 tablet by mouth once daily for blood pressure.   tamsulosin 0.4 MG Caps capsule Commonly known as:  FLOMAX Take two capsule daily       Allergies: No Known Allergies  Family History: Family History  Problem Relation Age of Onset  . Diabetes Father   . Alcohol abuse Paternal Uncle   . Alcohol abuse Paternal Uncle   . Alcohol abuse Paternal Uncle   . Rheum arthritis Mother   . Heart disease Neg Hx   . Stroke Neg Hx   . Cancer Neg Hx   . Drug abuse Neg Hx   . Depression Neg Hx   . Colon cancer Neg Hx   . Rectal cancer Neg Hx   . Stomach cancer Neg Hx   . Kidney cancer Neg Hx   . Bladder Cancer Neg Hx   . Prostate cancer Neg Hx     Social History:  reports that he has been smoking cigarettes. He has a 60.00 pack-year smoking history. He has never used smokeless tobacco. He reports that he drinks alcohol. He reports that he does not use drugs.  ROS: UROLOGY Frequent Urination?: No Hard to postpone urination?: Yes Burning/pain with urination?: No Get up at night to urinate?: Yes Leakage of urine?: No Urine stream starts and  stops?: No Trouble starting stream?: No Do you have to strain to  urinate?: No Blood in urine?: No Urinary tract infection?: No Sexually transmitted disease?: No Injury to kidneys or bladder?: No Painful intercourse?: No Weak stream?: No Erection problems?: No Penile pain?: No Gastrointestinal Nausea?: No Vomiting?: No Indigestion/heartburn?: No Diarrhea?: No Constipation?: No Constitutional Fever: No Night sweats?: No Weight loss?: No Fatigue?: Yes Skin Skin rash/lesions?: No Itching?: No Eyes Blurred vision?: Yes Double vision?: No Ears/Nose/Throat Sore throat?: No Sinus problems?: No Hematologic/Lymphatic Swollen glands?: No Easy bruising?: Yes Cardiovascular Leg swelling?: Yes Chest pain?: No Respiratory Cough?: Yes Shortness of breath?: No Endocrine Excessive thirst?: No Musculoskeletal Back pain?: No Joint pain?: Yes Neurological Headaches?: No Dizziness?: No Psychologic Depression?: No Anxiety?: No   Physical Exam: Blood pressure (!) 138/58, pulse (!) 56, weight 169 lb 6.4 oz (76.8 kg). Constitutional: Well nourished. Alert and oriented, No acute distress. HEENT: [place] AT, moist mucus membranes. Trachea midline, no masses. Cardiovascular: No clubbing, cyanosis, or edema. Respiratory: Normal respiratory effort, no increased work of breathing. GI: Abdomen is soft, non tender, non distended, no abdominal masses. Liver and spleen not palpable.  No hernias appreciated.  Stool sample for occult testing is not indicated.   GU: No CVA tenderness.  No bladder fullness or masses.  Patient with circumcised phallus.   Urethral meatus is patent.  No penile discharge. No penile lesions or rashes. Scrotum without lesions, cysts, rashes and/or edema.  Testicles are located scrotally bilaterally. No masses are appreciated in the testicles. Left and right epididymis are normal. Rectal: Patient with  normal sphincter tone. Anus and perineum without scarring or rashes.  No rectal masses are appreciated. Prostate is approximately 55 grams, no nodules are appreciated. Seminal vesicles are normal. Skin: No rashes, bruises or suspicious lesions. Lymph: No cervical or inguinal adenopathy. Neurologic: Grossly intact, no focal deficits, moving all 4 extremities. Psychiatric: Normal mood and affect.   Laboratory Data: Lab Results  Component Value Date   PSA 1.94 08/08/2011   PSA 1.73 04/24/2011   PSA 1.74 11/19/2006  PSA 0.8 (1.6) ng/ml on 10/12/2015 PSA 0.7 (1.4) ng/mL on 10/06/2016 PSA 0.6 (1.2)  in 09/2017  I have reviewed the labs.  Pertinent Imaging: Results for PARRISH, BONN (MRN [id number]) as of 10/10/2017 08:42  Ref. Range 10/10/2017 08:30  Scan Result Unknown 51     Assessment & Plan:   1. History of urinary retention His retention is brought on by alcohol consumption - Bladder Scan (Post Void Residual) in office Continue CIC prn  Schedule cystoscopy as he is CIC frequently - explained to the patient that this is necessary to evaluate for possible cancer due to the irritation of CIC I have explained to the patient that they will  be scheduled for a cystoscopy in our office to evaluate their bladder.  The cystoscopy consists of passing a tube with a lens up through their urethra and into their urinary bladder.   We will inject the urethra with a lidocaine gel prior to introducing the cystoscope to help with any discomfort during the procedure.   After the procedure, they might experience blood in the urine and discomfort with urination.  This will abate after the first few voids.  I have  encouraged the patient to increase water intake  during this time.  Patient denies any allergies to lidocaine.    2. BPH with LUTS IPSS score is 16/3, it is stable Continue conservative management, avoiding bladder irritants and timed voiding's Continue tamsulosin 0.4 mg twice daily and finasteride 5 mg daily - med's  refilled  RTC in 12 months for IPSS, PSA,  PVR and exam   Return for cystoscopy - patient CIC's .  Zara Council, PA-C  [facility] [address] [place], [place] [address] [phone]

## 2017-10-23 ENCOUNTER — Other Ambulatory Visit: Payer: Medicare Other | Admitting: Urology

## 2017-10-25 ENCOUNTER — Ambulatory Visit (INDEPENDENT_AMBULATORY_CARE_PROVIDER_SITE_OTHER): Payer: Medicare Other | Admitting: Podiatry

## 2017-10-25 ENCOUNTER — Encounter: Payer: Self-pay | Admitting: Podiatry

## 2017-10-25 DIAGNOSIS — E1142 Type 2 diabetes mellitus with diabetic polyneuropathy: Secondary | ICD-10-CM

## 2017-10-25 DIAGNOSIS — M79609 Pain in unspecified limb: Secondary | ICD-10-CM

## 2017-10-25 DIAGNOSIS — B351 Tinea unguium: Secondary | ICD-10-CM

## 2017-10-25 NOTE — Progress Notes (Signed)
Complaint:  Visit Type: Patient returns to my office for continued preventative foot care services. Complaint: Patient states" my nails have grown long and thick and become painful to walk and wear shoes" Patient has been diagnosed with DM with neuropathy. The patient presents for preventative foot care services. No changes to ROS  Podiatric Exam: Vascular: dorsalis pedis and posterior tibial pulses are barely palpable bilateral. Capillary return is immediate. Temperature gradient is WNL. Skin turgor WNL  Sensorium: Diminished Semmes Weinstein monofilament test. Normal tactile sensation bilaterally. Nail Exam: Pt has thick disfigured discolored nails with subungual debris noted bilateral entire nail hallux through fifth toenails Ulcer Exam: There is no evidence of ulcer or pre-ulcerative changes or infection. Orthopedic Exam: Muscle tone and strength are WNL. No limitations in general ROM. No crepitus or effusions noted. Foot type and digits show no abnormalities. DJD Midfoot  B/L Skin: No Porokeratosis. No infection or ulcers  Diagnosis:  Onychomycosis, , Pain in right toe, pain in left toes  Treatment & Plan Procedures and Treatment: Consent by patient was obtained for treatment procedures. The patient understood the discussion of treatment and procedures well. All questions were answered thoroughly reviewed. Debridement of mycotic and hypertrophic toenails, 1 through 5 bilateral and clearing of subungual debris. No ulceration, no infection noted. ABN signed for 2019. Return Visit-Office Procedure: Patient instructed to return to the office for a follow up visit 3 months for continued evaluation and treatment.    Gardiner Barefoot DPM

## 2017-10-29 ENCOUNTER — Other Ambulatory Visit: Payer: Medicare Other | Admitting: Urology

## 2017-11-02 ENCOUNTER — Encounter: Payer: Self-pay | Admitting: Urology

## 2017-11-02 ENCOUNTER — Ambulatory Visit (INDEPENDENT_AMBULATORY_CARE_PROVIDER_SITE_OTHER): Payer: Medicare Other | Admitting: Urology

## 2017-11-02 VITALS — BP 192/73 | HR 66 | Resp 16 | Ht 66.0 in | Wt 167.6 lb

## 2017-11-02 DIAGNOSIS — N401 Enlarged prostate with lower urinary tract symptoms: Secondary | ICD-10-CM

## 2017-11-02 DIAGNOSIS — N138 Other obstructive and reflux uropathy: Secondary | ICD-10-CM | POA: Diagnosis not present

## 2017-11-02 DIAGNOSIS — R319 Hematuria, unspecified: Secondary | ICD-10-CM | POA: Diagnosis not present

## 2017-11-02 LAB — URINALYSIS, COMPLETE
Bilirubin, UA: NEGATIVE
Glucose, UA: NEGATIVE
KETONES UA: NEGATIVE
NITRITE UA: NEGATIVE
PH UA: 5.5 (ref 5.0–7.5)
Urobilinogen, Ur: 1 mg/dL (ref 0.2–1.0)

## 2017-11-02 LAB — MICROSCOPIC EXAMINATION: Epithelial Cells (non renal): NONE SEEN /hpf (ref 0–10)

## 2017-11-02 NOTE — Progress Notes (Signed)
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 Cystoscopy was performed with a flexible cystoscope. The urethra, bladder neck and entire bladder was visualized in a standard fashion. The prostate was very large with significant lateral lobe hypertrophy and median lobe with intravesical protrusion. Small area of bullous edema at posterior wall, directed cytology sent, suspect secondary to CIC.  We discussed outlet procedures including TURP/HOLEP, however patient is content to continue CIC.  Findings: Large prostate Small patch of bullous edema at posterior wall, suspect secondary to CIC  Assessment and Plan: Keep yearly follow up with Zara Council, PA If cytology suspicious or positive, will biopsy in [place], [place] 11/02/2017

## 2017-11-07 ENCOUNTER — Other Ambulatory Visit: Payer: Self-pay | Admitting: Urology

## 2017-11-21 DIAGNOSIS — E1129 Type 2 diabetes mellitus with other diabetic kidney complication: Secondary | ICD-10-CM | POA: Diagnosis not present

## 2017-11-21 DIAGNOSIS — Z794 Long term (current) use of insulin: Secondary | ICD-10-CM | POA: Diagnosis not present

## 2017-11-21 DIAGNOSIS — R809 Proteinuria, unspecified: Secondary | ICD-10-CM | POA: Diagnosis not present

## 2017-11-28 DIAGNOSIS — F17200 Nicotine dependence, unspecified, uncomplicated: Secondary | ICD-10-CM | POA: Diagnosis not present

## 2017-11-28 DIAGNOSIS — E1142 Type 2 diabetes mellitus with diabetic polyneuropathy: Secondary | ICD-10-CM | POA: Diagnosis not present

## 2017-11-28 DIAGNOSIS — Z794 Long term (current) use of insulin: Secondary | ICD-10-CM | POA: Diagnosis not present

## 2017-11-28 DIAGNOSIS — R809 Proteinuria, unspecified: Secondary | ICD-10-CM | POA: Diagnosis not present

## 2017-11-28 DIAGNOSIS — I739 Peripheral vascular disease, unspecified: Secondary | ICD-10-CM | POA: Diagnosis not present

## 2017-11-28 DIAGNOSIS — R202 Paresthesia of skin: Secondary | ICD-10-CM | POA: Diagnosis not present

## 2017-11-28 DIAGNOSIS — E1129 Type 2 diabetes mellitus with other diabetic kidney complication: Secondary | ICD-10-CM | POA: Diagnosis not present

## 2017-12-11 ENCOUNTER — Ambulatory Visit (INDEPENDENT_AMBULATORY_CARE_PROVIDER_SITE_OTHER): Payer: Medicare Other | Admitting: Vascular Surgery

## 2017-12-11 ENCOUNTER — Encounter (INDEPENDENT_AMBULATORY_CARE_PROVIDER_SITE_OTHER): Payer: Self-pay | Admitting: Vascular Surgery

## 2017-12-11 VITALS — BP 193/84 | HR 59 | Resp 17 | Ht 68.0 in | Wt 168.0 lb

## 2017-12-11 DIAGNOSIS — M79605 Pain in left leg: Secondary | ICD-10-CM | POA: Diagnosis not present

## 2017-12-11 DIAGNOSIS — I10 Essential (primary) hypertension: Secondary | ICD-10-CM

## 2017-12-11 DIAGNOSIS — Z720 Tobacco use: Secondary | ICD-10-CM | POA: Diagnosis not present

## 2017-12-11 DIAGNOSIS — Z794 Long term (current) use of insulin: Secondary | ICD-10-CM | POA: Diagnosis not present

## 2017-12-11 DIAGNOSIS — M79609 Pain in unspecified limb: Secondary | ICD-10-CM | POA: Insufficient documentation

## 2017-12-11 DIAGNOSIS — E7800 Pure hypercholesterolemia, unspecified: Secondary | ICD-10-CM | POA: Diagnosis not present

## 2017-12-11 DIAGNOSIS — M79604 Pain in right leg: Secondary | ICD-10-CM | POA: Diagnosis not present

## 2017-12-11 DIAGNOSIS — E11618 Type 2 diabetes mellitus with other diabetic arthropathy: Secondary | ICD-10-CM

## 2017-12-11 DIAGNOSIS — F17210 Nicotine dependence, cigarettes, uncomplicated: Secondary | ICD-10-CM

## 2017-12-11 DIAGNOSIS — I2510 Atherosclerotic heart disease of native coronary artery without angina pectoris: Secondary | ICD-10-CM | POA: Diagnosis not present

## 2017-12-11 HISTORY — DX: Pain in unspecified limb: M79.609

## 2017-12-11 NOTE — Progress Notes (Signed)
Patient ID: Antonio [name], male   DOB: 1945-03-08, 72 y.o.   MRN: [id number]  Chief Complaint  Patient presents with  . New Patient (Initial Visit)    Evaluation for PVD    HPI Antonio [name] is a 72 y.o. male.  I am asked to see the patient by Dr. [name] for evaluation of PAD.  The patient reports worsening pain in both legs over many years.  He is at the point he can only walk very short distances before having to stop and rest.  The pain is become debilitating and bothersome on a daily basis.  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  He does not have open ulcerations or infection.  He does have pain even at rest and sometimes does dangle his feet.  This is associated with mild swelling.  No fevers or chills.  No chest pain or shortness of breath.  Both legs bother him about the same.  Given his multiple atherosclerotic risk factors, consideration for PAD as a causative factor is given   Past Medical History:  Diagnosis Date  . 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 Coronary atherosclerosis of unspecified type of vessel, native or graft   . Diabetes mellitus    Type II  . Esophageal stricture   . Gastritis   . Hiatal hernia   . Hx of colonic polyps   . Hyperlipidemia   . Hypertension   . Internal hemorrhoids without mention of complication   . Lumbago   . PSA elevation    now averaging 2's  . Tobacco abuse   . URI (upper respiratory infection)   . Urinary retention     Past Surgical History:  Procedure Laterality Date  . 2D Echo  06/03  . Abdominal ultrasound  06/03   Negative  . CT of head  and sinuses  06/03   Negative  . CT of the chest, abdomen, pelvis  06/03   Chest negative; Abdomen, left adrenal lesion; Pelvis, enlarged prostate  . ESOPHAGOGASTRODUODENOSCOPY  02/13/07   gastritis and duodenitis without bleed  . ESOPHAGOGASTRODUODENOSCOPY N/A 06/26/2014   Procedure:  ESOPHAGOGASTRODUODENOSCOPY (EGD);  Surgeon: Milus Banister, MD;  Location: [facility];  Service: Endoscopy;  Laterality: N/A;  . KNEE ARTHROSCOPY  10/00   Right  . LP  06/03  . Microwave thermotherapy prostate  08/28/07   Rogers Blocker  . Persantine cardiolite  03/01   EF 68%  . POLYPECTOMY  11/95  . Stress myoview  07/06   EF 67%    Family History  Problem Relation Age of Onset  . Diabetes Father   . Alcohol abuse Paternal Uncle   . Alcohol abuse Paternal Uncle   . Alcohol abuse Paternal Uncle   . Rheum arthritis Mother   . Heart disease Neg Hx   . Stroke Neg Hx   . Cancer Neg Hx   . Drug abuse Neg Hx   . Depression Neg Hx   . Colon cancer Neg Hx   . Rectal cancer Neg Hx   . Stomach cancer Neg Hx   . Kidney cancer Neg Hx   . Bladder Cancer Neg Hx   . Prostate cancer Neg Hx      Social History Social History   Tobacco Use  . Smoking status: Current Every Day Smoker    Packs/day: 1.50    Years: 40.00  Pack years: 60.00    Types: Cigarettes  . Smokeless tobacco: Current User  Substance Use Topics  . Alcohol use: Yes    Alcohol/week: 0.0 standard drinks    Comment: rare  . Drug use: No     No Known Allergies  Current Outpatient Medications  Medication Sig Dispense Refill  . aspirin 81 MG tablet Take 81 mg by mouth daily.    [name] atorvastatin (LIPITOR) 20 MG tablet Take 1 tablet by mouth every evening for cholesterol. 90 tablet 3  . finasteride (PROSCAR) 5 MG tablet Take 1 tablet (5 mg total) by mouth daily. 90 tablet 3  . glipiZIDE (GLUCOTROL) 10 MG tablet Take 10 mg by mouth daily before supper.     [name] glucose blood ([employer] TEST STRIPS) test strip Use one strip three times daily.    . insulin detemir (LEVEMIR) 100 UNIT/ML injection Inject 20 units at bedtime. (Patient taking differently: every morning. Inject 20 units at bedtime.) 10 pen 5  . metFORMIN (GLUCOPHAGE) 500 MG tablet TAKE TWO TABLETS BY MOUTH TWICE DAILY WITH A MEAL for diabetes. 360 tablet 3  .  metoprolol tartrate (LOPRESSOR) 50 MG tablet TAKE 1/2 (ONE-HALF) TABLET BY MOUTH TWICE DAILY 45 tablet 3  . pantoprazole (PROTONIX) 40 MG tablet Take 1 tablet (40 mg total) by mouth daily. 90 tablet 3  . ramipril (ALTACE) 10 MG capsule Take 1 tablet by mouth once daily for blood pressure. 90 capsule 3  . tamsulosin (FLOMAX) 0.4 MG CAPS capsule Take two capsule daily 180 capsule 4   No current facility-administered medications for this visit.       REVIEW OF SYSTEMS (Negative unless checked)  Constitutional: [] Weight loss  [] Fever  [] Chills Cardiac: [] Chest pain   [] Chest pressure   [] Palpitations   [] Shortness of breath when laying flat   [] Shortness of breath at rest   [] Shortness of breath with exertion. Vascular:  [x] Pain in legs with walking   [] Pain in legs at rest   [] Pain in legs when laying flat   [x] Claudication   [x] Pain in feet when walking  [] Pain in feet at rest  [] Pain in feet when laying flat   [] History of DVT   [] Phlebitis   [] Swelling in legs   [] Varicose veins   [] Non-healing ulcers Pulmonary:   [] Uses home oxygen   [] Productive cough   [] Hemoptysis   [] Wheeze  [] COPD   [] Asthma Neurologic:  [] Dizziness  [] Blackouts   [] Seizures   [] History of stroke   [] History of TIA  [] Aphasia   [] Temporary blindness   [] Dysphagia   [] Weakness or numbness in arms   [] Weakness or numbness in legs Musculoskeletal:  [x] Arthritis   [] Joint swelling   [x] Joint pain   [] Low back pain Hematologic:  [] Easy bruising  [] Easy bleeding   [] Hypercoagulable state   [] Anemic  [] Hepatitis Gastrointestinal:  [] Blood in stool   [] Vomiting blood  [] Gastroesophageal reflux/heartburn   [] Abdominal pain Genitourinary:  [] Chronic kidney disease   [] Difficult urination  [] Frequent urination  [] Burning with urination   [] Hematuria Skin:  [] Rashes   [] Ulcers   [] Wounds Psychological:  [] History of anxiety   []  History of major depression.    Physical Exam BP (!) 193/84 (BP Location: Right Arm, Patient  Position: Sitting)   Pulse (!) 59   Resp 17   Ht 5\' 8"  (1.727 m)   Wt 168 lb (76.2 kg)   BMI 25.54 kg/m  Gen:  WD/WN, NAD Head: [name]/AT, No temporalis wasting.  Ear/Nose/Throat: Hearing grossly  intact, nares w/o erythema or drainage, oropharynx w/o Erythema/Exudate Eyes: Conjunctiva clear, sclera non-icteric  Neck: trachea midline.  No JVD.  Pulmonary:  Good air movement, respirations not labored, no use of accessory muscles Cardiac: RRR, normal S1, S2 Vascular:  Vessel Right Left  Radial Palpable Palpable                          PT 1+ Palpable 1+ Palpable  DP 1+ Palpable 1+ Palpable   Gastrointestinal: soft, non-tender/non-distended.  Musculoskeletal: M/S 5/5 throughout.  Left leg is shorter than the right resulting in an unbalanced ambulation.  1+ bilateral lower extremity edema Neurologic: Sensation grossly intact in extremities.  Symmetrical.  Speech is fluent. Motor exam as listed above. Psychiatric: Judgment intact, Mood & affect appropriate for pt's clinical situation. Dermatologic: No rashes or ulcers noted.  No cellulitis or open wounds.    Radiology No results found.  Labs Recent Results (from the past 2160 hour(s))  POCT glycosylated hemoglobin (Hb A1C)     Status: Abnormal   Collection Time: 09/14/17  8:30 AM  Result Value Ref Range   Hemoglobin A1C 6.8 (A) 4.0 - 5.6 %   HbA1c POC (<> result, manual entry)     HbA1c, POC (prediabetic range)     HbA1c, POC (controlled diabetic range)    TSH     Status: None   Collection Time: 09/14/17  8:30 AM  Result Value Ref Range   TSH 1.11 0.35 - 4.50 uIU/mL  PSA     Status: None   Collection Time: 10/04/17  8:20 AM  Result Value Ref Range   Prostate Specific Ag, Serum 0.6 0.0 - 4.0 ng/mL    Comment: Roche ECLIA methodology. According to the American Urological Association, Serum PSA should decrease and remain at undetectable levels after radical prostatectomy. The AUA defines biochemical recurrence as an  initial PSA value 0.2 ng/mL or greater followed by a subsequent confirmatory PSA value 0.2 ng/mL or greater. Values obtained with different assay methods or kits cannot be used interchangeably. Results cannot be interpreted as absolute evidence of the presence or absence of malignant disease.   Bladder Scan (Post Void Residual) in office     Status: None   Collection Time: 10/10/17  8:30 AM  Result Value Ref Range   Scan Result 51   Urinalysis, Complete     Status: Abnormal   Collection Time: 11/02/17  1:59 PM  Result Value Ref Range   Specific Gravity, UA >1.030 (H) 1.005 - 1.030   pH, UA 5.5 5.0 - 7.5   Color, UA Yellow Yellow   Appearance Ur Cloudy (A) Clear   Leukocytes, UA 2+ (A) Negative   Protein, UA 2+ (A) Negative/Trace   Glucose, UA Negative Negative   Ketones, UA Negative Negative   RBC, UA 1+ (A) Negative   Bilirubin, UA Negative Negative   Urobilinogen, Ur 1.0 0.2 - 1.0 mg/dL   Nitrite, UA Negative Negative   Microscopic Examination See below:   Microscopic Examination     Status: Abnormal   Collection Time: 11/02/17  1:59 PM  Result Value Ref Range   WBC, UA 11-30 (A) 0 - 5 /hpf   RBC, UA 0-2 0 - 2 /hpf   Epithelial Cells (non renal) None seen 0 - 10 /hpf   Casts Present (A) None seen /lpf   Cast Type Hyaline casts N/A   Mucus, UA Present (A) Not Estab.   Bacteria, UA Few (  A) None seen/Few    Assessment/Plan:  Essential hypertension blood pressure control important in reducing the progression of atherosclerotic disease. On appropriate oral medications.   Diabetes mellitus type 2, controlled blood glucose control important in reducing the progression of atherosclerotic disease. Also, involved in wound healing. On appropriate medications.   Tobacco abuse We had a discussion for approximately 3 minutes regarding the absolute need for smoking cessation due to the deleterious nature of tobacco on the vascular system. We discussed the tobacco use would  diminish patency of any intervention, and likely significantly worsen progressio of disease. We discussed multiple agents for quitting including replacement therapy or medications to reduce cravings such as Chantix. The patient voices their understanding of the importance of smoking cessation.   HLD (hyperlipidemia) lipid control important in reducing the progression of atherosclerotic disease. Continue statin therapy   Pain in limb Recommend:  Patient should undergo arterial duplex of the lower extremity ASAP because there has been a significant deterioration in the patient's lower extremity symptoms.  The patient states they are having increased pain and a marked decrease in the distance that they can walk.  The risks and benefits as well as the alternatives were discussed in detail with the patient.  All questions were answered.  Patient agrees to proceed and understands this could be a prelude to angiography and intervention.  The patient will follow up with me in the office to review the studies.       Leotis Pain 12/11/2017, 3:42 PM   This note was created with Dragon medical transcription system.  Any errors from dictation are unintentional.

## 2017-12-11 NOTE — Assessment & Plan Note (Signed)

## 2017-12-11 NOTE — Assessment & Plan Note (Signed)
lipid control important in reducing the progression of atherosclerotic disease. Continue statin therapy  

## 2017-12-11 NOTE — Patient Instructions (Signed)

## 2017-12-11 NOTE — Assessment & Plan Note (Signed)
Recommend:  Patient should undergo arterial duplex of the lower extremity ASAP because there has been a significant deterioration in the patient's lower extremity symptoms.  The patient states they are having increased pain and a marked decrease in the distance that they can walk.  The risks and benefits as well as the alternatives were discussed in detail with the patient.  All questions were answered.  Patient agrees to proceed and understands this could be a prelude to angiography and intervention.  The patient will follow up with me in the office to review the studies.

## 2017-12-11 NOTE — Assessment & Plan Note (Signed)
blood glucose control important in reducing the progression of atherosclerotic disease. Also, involved in wound healing. On appropriate medications.  

## 2017-12-11 NOTE — Assessment & Plan Note (Signed)
blood pressure control important in reducing the progression of atherosclerotic disease. On appropriate oral medications.  

## 2017-12-12 ENCOUNTER — Telehealth: Payer: Self-pay

## 2017-12-12 ENCOUNTER — Ambulatory Visit (INDEPENDENT_AMBULATORY_CARE_PROVIDER_SITE_OTHER): Payer: Medicare Other | Admitting: Vascular Surgery

## 2017-12-12 ENCOUNTER — Encounter (INDEPENDENT_AMBULATORY_CARE_PROVIDER_SITE_OTHER): Payer: Self-pay | Admitting: Vascular Surgery

## 2017-12-12 ENCOUNTER — Ambulatory Visit (INDEPENDENT_AMBULATORY_CARE_PROVIDER_SITE_OTHER): Payer: Medicare Other

## 2017-12-12 VITALS — BP 182/77 | HR 61 | Resp 16 | Ht 66.0 in | Wt 169.0 lb

## 2017-12-12 DIAGNOSIS — Z794 Long term (current) use of insulin: Secondary | ICD-10-CM

## 2017-12-12 DIAGNOSIS — Z720 Tobacco use: Secondary | ICD-10-CM | POA: Diagnosis not present

## 2017-12-12 DIAGNOSIS — E7800 Pure hypercholesterolemia, unspecified: Secondary | ICD-10-CM

## 2017-12-12 DIAGNOSIS — M79604 Pain in right leg: Secondary | ICD-10-CM | POA: Diagnosis not present

## 2017-12-12 DIAGNOSIS — M79605 Pain in left leg: Secondary | ICD-10-CM | POA: Diagnosis not present

## 2017-12-12 DIAGNOSIS — I2510 Atherosclerotic heart disease of native coronary artery without angina pectoris: Secondary | ICD-10-CM | POA: Diagnosis not present

## 2017-12-12 DIAGNOSIS — E11618 Type 2 diabetes mellitus with other diabetic arthropathy: Secondary | ICD-10-CM

## 2017-12-12 DIAGNOSIS — I10 Essential (primary) hypertension: Secondary | ICD-10-CM

## 2017-12-12 DIAGNOSIS — I739 Peripheral vascular disease, unspecified: Secondary | ICD-10-CM | POA: Insufficient documentation

## 2017-12-12 NOTE — Telephone Encounter (Addendum)
Please have him monitor home BP and get him in the office with me this Friday. We have one opening. Please have him go to the hospital if he notices headaches, dizziness, nausea, chest pain.

## 2017-12-12 NOTE — Telephone Encounter (Signed)
Pt was seeen at [facility] this morning for routine follow-up/ His BP elevated yesterday 193/90 and 216/?? And 182/77 at the specialist. He is not having a headache or nausea. Feels weak and tired recently. He was told the Vascular specialist would send Anda Kraft some information, but advised him to call, also. He has not missed any doses of metoprolol or ramipril. He is asking what he needs to do.

## 2017-12-12 NOTE — Progress Notes (Signed)
Subjective:    Patient ID: Antonio [name], male    DOB: 03/08/1945, 72 y.o.   MRN: [id number] Chief Complaint  Patient presents with  . Follow-up    pt conv abi   Patient presents to review vascular studies.  Patient was last seen yesterday for his initial evaluation of bilateral lower extremity claudication.  The patient has noted a progressive worsening in his discomfort and a shortening in his claudication distance of the last "couple of years".  The patient notes that his symptoms have progressed to the point that he is unable to walk farther than 15 to 20 feet without experiencing lifestyle limiting symptoms.  Denies rest pain or ulcer formation to the bilateral lower extremity.  The patient underwent a bilateral ABI which was notable for right: 0.46, monophasic tibials with abnormal right toe brachial index. Left: 0.61, monophasic tibials with abnormal toe brachial indices.  Of note, the patient's blood pressure was elevated into the 170s.  I encouraged him to touch base with his primary care physician.  The patient noted that he would call her office this afternoon.  Patient denies any fever, nausea vomiting.  Review of Systems  Constitutional: Negative.   HENT: Negative.   Eyes: Negative.   Respiratory: Negative.   Cardiovascular:       Bilateral lower extremity claudication  Gastrointestinal: Negative.   Endocrine: Negative.   Genitourinary: Negative.   Musculoskeletal: Negative.   Skin: Negative.   Allergic/Immunologic: Negative.   Neurological: Negative.   Hematological: Negative.   Psychiatric/Behavioral: Negative.       Objective:   Physical Exam  Constitutional: He is oriented to person, place, and time. He appears well-developed and well-nourished. No distress.  HENT:  Head: Normocephalic and atraumatic.  Right Ear: External ear normal.  Left Ear: External ear normal.  Eyes: Pupils are equal, round, and reactive to light. Conjunctivae and EOM are normal.  Neck:  Normal range of motion.  Cardiovascular: Normal rate, regular rhythm, normal heart sounds and intact distal pulses.  Pulses:      Radial pulses are 2+ on the right side, and 2+ on the left side.  Unable to palpate pedal pulses sluggish capillary refill however the bilateral feet are not pale  Pulmonary/Chest: Effort normal and breath sounds normal.  Musculoskeletal: Normal range of motion. He exhibits no edema.  Neurological: He is alert and oriented to person, place, and time.  Skin: Skin is warm and dry. He is not diaphoretic.  Psychiatric: He has a normal mood and affect. His behavior is normal. Judgment and thought content normal.  Vitals reviewed.  BP (!) 182/77 (BP Location: Right Arm)   Pulse 61   Resp 16   Ht 5\' 6"  (1.676 m)   Wt 169 lb (76.7 kg)   BMI 27.28 kg/m   Past Medical History:  Diagnosis Date  . 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 Coronary atherosclerosis of unspecified type of vessel, native or graft   . Diabetes mellitus    Type II  . Esophageal stricture   . Gastritis   . Hiatal hernia   . Hx of colonic polyps   . Hyperlipidemia   . Hypertension   . Internal hemorrhoids without mention of complication   . Lumbago   . PSA elevation    now averaging 2's  . Tobacco abuse   . URI (upper respiratory infection)   . Urinary retention    Social History   Socioeconomic  History  . Marital status: Married    Spouse name: [name]  . Number of children: 3  . Years of education: Not on file  . Highest education level: Not on file  Occupational History  . Occupation: Retired Animal nutritionist  . Financial resource strain: Not on file  . Food insecurity:    Worry: Not on file    Inability: Not on file  . Transportation needs:    Medical: Not on file    Non-medical: Not on file  Tobacco Use  . Smoking status: Current Every Day Smoker    Packs/day: 1.50    Years: 40.00    Pack years: 60.00    Types: Cigarettes  .  Smokeless tobacco: Current User  Substance and Sexual Activity  . Alcohol use: Yes    Alcohol/week: 0.0 standard drinks    Comment: rare  . Drug use: No  . Sexual activity: Not on file  Lifestyle  . Physical activity:    Days per week: Not on file    Minutes per session: Not on file  . Stress: Not on file  Relationships  . Social connections:    Talks on phone: Not on file    Gets together: Not on file    Attends religious service: Not on file    Active member of club or organization: Not on file    Attends meetings of clubs or organizations: Not on file    Relationship status: Not on file  . Intimate partner violence:    Fear of current or ex partner: Not on file    Emotionally abused: Not on file    Physically abused: Not on file    Forced sexual activity: Not on file  Other Topics Concern  . Not on file  Social History Narrative   Married 9/09 wife with moderate memory problems.   3 adult children, 2 grandchildren   Would desire CPR   Past Surgical History:  Procedure Laterality Date  . 2D Echo  06/03  . Abdominal ultrasound  06/03   Negative  . CT of head  and sinuses  06/03   Negative  . CT of the chest, abdomen, pelvis  06/03   Chest negative; Abdomen, left adrenal lesion; Pelvis, enlarged prostate  . ESOPHAGOGASTRODUODENOSCOPY  02/13/07   gastritis and duodenitis without bleed  . ESOPHAGOGASTRODUODENOSCOPY N/A 06/26/2014   Procedure: ESOPHAGOGASTRODUODENOSCOPY (EGD);  Surgeon: Milus Banister, MD;  Location: [facility];  Service: Endoscopy;  Laterality: N/A;  . KNEE ARTHROSCOPY  10/00   Right  . LP  06/03  . Microwave thermotherapy prostate  08/28/07   Rogers Blocker  . Persantine cardiolite  03/01   EF 68%  . POLYPECTOMY  11/95  . Stress myoview  07/06   EF 67%   Family History  Problem Relation Age of Onset  . Diabetes Father   . Alcohol abuse Paternal Uncle   . Alcohol abuse Paternal Uncle   . Alcohol abuse Paternal Uncle   . Rheum arthritis Mother   .  Heart disease Neg Hx   . Stroke Neg Hx   . Cancer Neg Hx   . Drug abuse Neg Hx   . Depression Neg Hx   . Colon cancer Neg Hx   . Rectal cancer Neg Hx   . Stomach cancer Neg Hx   . Kidney cancer Neg Hx   . Bladder Cancer Neg Hx   . Prostate cancer Neg Hx    No Known Allergies  Assessment & Plan:  Patient presents to review vascular studies.  Patient was last seen yesterday for his initial evaluation of bilateral lower extremity claudication.  The patient has noted a progressive worsening in his discomfort and a shortening in his claudication distance of the last "couple of years".  The patient notes that his symptoms have progressed to the point that he is unable to walk farther than 15 to 20 feet without experiencing lifestyle limiting symptoms.  Denies rest pain or ulcer formation to the bilateral lower extremity.  The patient underwent a bilateral ABI which was notable for right: 0.46, monophasic tibials with abnormal right toe brachial index. Left: 0.61, monophasic tibials with abnormal toe brachial indices.  Of note, the patient's blood pressure was elevated into the 170s.  I encouraged him to touch base with his primary care physician.  The patient noted that he would call her office this afternoon.  Patient denies any fever, nausea vomiting.  1. PAD (peripheral artery disease) ([place]) - New Patient presents with lifestyle limiting claudication-like symptoms to the bilateral legs Right lower extremity symptoms are slightly worse than left Patient with severe to moderate right lower extremity atherosclerotic disease and moderate left lower extremity atherosclerotic disease The patient notes that his symptoms have progressed to the point they become lifestyle limiting Unable to palpate pedal pulses on exam Recommend a right lower extremity angiogram followed by left lower extremity angiogram and attempt to assess the patient's anatomy, degree of stenosis and if appropriate at that time an  attempt to revascularize the extremity can be made Procedure, risks and benefits explained to the patient All questions answered The patient wishes to proceed  2. Essential hypertension - Stable Encouraged good control as its slows the progression of atherosclerotic disease  3. 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 Pure hypercholesterolemia - Stable On ASA and statin Encouraged good control as its slows the progression of atherosclerotic disease  5. Tobacco abuse - Stable We had a discussion for approximately three minutes regarding the absolute need for smoking cessation due to the deleterious nature of tobacco on the vascular system. We discussed the tobacco use would diminish patency of any intervention, and likely significantly worsen progressio of disease. We discussed multiple agents for quitting including replacement therapy or medications to reduce cravings such as Chantix. The patient voices their understanding of the importance of smoking cessation.  Current Outpatient Medications on File Prior to Visit  Medication Sig Dispense Refill  . aspirin 81 MG tablet Take 81 mg by mouth daily.    [name] atorvastatin (LIPITOR) 20 MG tablet Take 1 tablet by mouth every evening for cholesterol. 90 tablet 3  . finasteride (PROSCAR) 5 MG tablet Take 1 tablet (5 mg total) by mouth daily. 90 tablet 3  . glipiZIDE (GLUCOTROL) 10 MG tablet Take 10 mg by mouth daily before supper.     [name] glucose blood ([employer] TEST STRIPS) test strip Use one strip three times daily.    . insulin detemir (LEVEMIR) 100 UNIT/ML injection Inject 20 units at bedtime. (Patient taking differently: every morning. Inject 20 units at bedtime.) 10 pen 5  . metFORMIN (GLUCOPHAGE) 500 MG tablet TAKE TWO TABLETS BY MOUTH TWICE DAILY WITH A MEAL for diabetes. 360 tablet 3    . metoprolol tartrate (LOPRESSOR) 50 MG tablet TAKE 1/2 (ONE-HALF) TABLET BY MOUTH TWICE DAILY 45  tablet 3  . pantoprazole (PROTONIX) 40 MG tablet Take 1 tablet (40 mg total) by mouth daily. 90 tablet 3  . ramipril (ALTACE) 10 MG capsule Take 1 tablet by mouth once daily for blood pressure. 90 capsule 3  . tamsulosin (FLOMAX) 0.4 MG CAPS capsule Take two capsule daily 180 capsule 4   No current facility-administered medications on file prior to visit.    There are no Patient Instructions on file for this visit. No follow-ups on file.  Leyani Gargus A Solstice Lastinger, PA-C

## 2017-12-12 NOTE — Telephone Encounter (Signed)
Spoken and notified patient of Antonio [name] comments. Patient verbalized understanding.  OV appt on 12/14/2017

## 2017-12-13 ENCOUNTER — Encounter (INDEPENDENT_AMBULATORY_CARE_PROVIDER_SITE_OTHER): Payer: Self-pay

## 2017-12-14 ENCOUNTER — Encounter: Payer: Self-pay | Admitting: Primary Care

## 2017-12-14 ENCOUNTER — Ambulatory Visit (INDEPENDENT_AMBULATORY_CARE_PROVIDER_SITE_OTHER): Payer: Medicare Other | Admitting: Primary Care

## 2017-12-14 DIAGNOSIS — Z720 Tobacco use: Secondary | ICD-10-CM

## 2017-12-14 DIAGNOSIS — I2510 Atherosclerotic heart disease of native coronary artery without angina pectoris: Secondary | ICD-10-CM | POA: Diagnosis not present

## 2017-12-14 DIAGNOSIS — I739 Peripheral vascular disease, unspecified: Secondary | ICD-10-CM | POA: Diagnosis not present

## 2017-12-14 DIAGNOSIS — I10 Essential (primary) hypertension: Secondary | ICD-10-CM

## 2017-12-14 MED ORDER — RAMIPRIL 10 MG PO CAPS: ORAL_CAPSULE | ORAL | 0 refills | Status: DC

## 2017-12-14 NOTE — Patient Instructions (Signed)
Increase your ramipril to 20 mg, take two of your 10 mg tablets to equal 20 mg. Please call me if you don't have enough in your current medication bottle.   Start monitoring your blood pressure daily, around the same time of day, for the next 2 weeks.  Ensure that you have rested for 30 minutes prior to checking your blood pressure. Record your readings and bring them to your next visit.  Schedule a follow up visit in one week for blood pressure check.  It was a pleasure to see you today!

## 2017-12-14 NOTE — Assessment & Plan Note (Signed)
Uncontrolled. No home readings.  Increase ramipril to 20 mg daily, continue metoprolol tartrate 25 mg BID.   Discussed for him to purchase a home BP cuff for monitoring, he agrees.   Follow up in 1 week for BP check. Strict ED precautions provided including chest pain, dizziness, increased fatigue. He verbalized understanding.

## 2017-12-14 NOTE — Assessment & Plan Note (Signed)
Agrees to stop smoking but doesn't wish to trial medication for tobacco cessation until after the procedure on 11/21.

## 2017-12-14 NOTE — Assessment & Plan Note (Signed)
Will undergo bilateral lower extremity angiography on 12/27/17. Following with vascular surgery.

## 2017-12-14 NOTE — Progress Notes (Signed)
Subjective:    Patient ID: Antonio [name], male    DOB: 1946/01/12, 72 y.o.   MRN: [id number]  HPI  Antonio [name] is a 72 year old male with a history of CAD, hypertension, PAD, diabetes who presents today with a chief complaint of hypertension.  BP Readings from Last 3 Encounters:  12/14/17 (!) 186/80  12/12/17 (!) 182/77  12/11/17 (!) 193/84   He phoned into our office two days ago with reports of elevated blood pressure readings of 193/84, 182/77. He also endorsed feeling weak and tired recently. He endorsed compliance to his ramipril and metoprolol tartrate 25 mg BID. He was asked to monitor his readings and follow up today.  He has not been checking his BP at home as he doesn't have a cuff. He continues to smoke cigarettes. He is willing to stop smoking. He once quit with Zyban and is wanting to try this again after his bilateral lower extremity angiography scheduled for November 21st.  He denies chest pain, dizziness.   Review of Systems  Constitutional: Positive for fatigue.  Respiratory: Negative for shortness of breath.   Cardiovascular: Negative for chest pain.  Neurological: Negative for dizziness and headaches.       Past Medical History:  Diagnosis Date  . 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 Coronary atherosclerosis of unspecified type of vessel, native or graft   . Diabetes mellitus    Type II  . Esophageal stricture   . Gastritis   . Hiatal hernia   . Hx of colonic polyps   . Hyperlipidemia   . Hypertension   . Internal hemorrhoids without mention of complication   . Lumbago   . PSA elevation    now averaging 2's  . Tobacco abuse   . URI (upper respiratory infection)   . Urinary retention      Social History   Socioeconomic History  . Marital status: Married    Spouse name: [name]  . Number of children: 3  . Years of education: Not on file  . Highest education level: Not on file  Occupational History  . Occupation:  Retired Animal nutritionist  . Financial resource strain: Not on file  . Food insecurity:    Worry: Not on file    Inability: Not on file  . Transportation needs:    Medical: Not on file    Non-medical: Not on file  Tobacco Use  . Smoking status: Current Every Day Smoker    Packs/day: 1.50    Years: 40.00    Pack years: 60.00    Types: Cigarettes  . Smokeless tobacco: Current User  Substance and Sexual Activity  . Alcohol use: Yes    Alcohol/week: 0.0 standard drinks    Comment: rare  . Drug use: No  . Sexual activity: Not on file  Lifestyle  . Physical activity:    Days per week: Not on file    Minutes per session: Not on file  . Stress: Not on file  Relationships  . Social connections:    Talks on phone: Not on file    Gets together: Not on file    Attends religious service: Not on file    Active member of club or organization: Not on file    Attends meetings of clubs or organizations: Not on file    Relationship status: Not on file  . Intimate partner violence:    Fear of current or ex  partner: Not on file    Emotionally abused: Not on file    Physically abused: Not on file    Forced sexual activity: Not on file  Other Topics Concern  . Not on file  Social History Narrative   Married 9/09 wife with moderate memory problems.   3 adult children, 2 grandchildren   Would desire CPR    Past Surgical History:  Procedure Laterality Date  . 2D Echo  06/03  . Abdominal ultrasound  06/03   Negative  . CT of head  and sinuses  06/03   Negative  . CT of the chest, abdomen, pelvis  06/03   Chest negative; Abdomen, left adrenal lesion; Pelvis, enlarged prostate  . ESOPHAGOGASTRODUODENOSCOPY  02/13/07   gastritis and duodenitis without bleed  . ESOPHAGOGASTRODUODENOSCOPY N/A 06/26/2014   Procedure: ESOPHAGOGASTRODUODENOSCOPY (EGD);  Surgeon: Milus Banister, MD;  Location: [facility];  Service: Endoscopy;  Laterality: N/A;  . KNEE ARTHROSCOPY  10/00   Right  . LP   06/03  . Microwave thermotherapy prostate  08/28/07   Rogers Blocker  . Persantine cardiolite  03/01   EF 68%  . POLYPECTOMY  11/95  . Stress myoview  07/06   EF 67%    Family History  Problem Relation Age of Onset  . Diabetes Father   . Alcohol abuse Paternal Uncle   . Alcohol abuse Paternal Uncle   . Alcohol abuse Paternal Uncle   . Rheum arthritis Mother   . Heart disease Neg Hx   . Stroke Neg Hx   . Cancer Neg Hx   . Drug abuse Neg Hx   . Depression Neg Hx   . Colon cancer Neg Hx   . Rectal cancer Neg Hx   . Stomach cancer Neg Hx   . Kidney cancer Neg Hx   . Bladder Cancer Neg Hx   . Prostate cancer Neg Hx     No Known Allergies  Current Outpatient Medications on File Prior to Visit  Medication Sig Dispense Refill  . aspirin 81 MG tablet Take 81 mg by mouth daily.    [name] atorvastatin (LIPITOR) 20 MG tablet Take 1 tablet by mouth every evening for cholesterol. 90 tablet 3  . finasteride (PROSCAR) 5 MG tablet Take 1 tablet (5 mg total) by mouth daily. 90 tablet 3  . glipiZIDE (GLUCOTROL) 10 MG tablet Take 10 mg by mouth daily before supper.     [name] glucose blood ([employer] TEST STRIPS) test strip Use one strip three times daily.    . insulin detemir (LEVEMIR) 100 UNIT/ML injection Inject 20 units at bedtime. (Patient taking differently: every morning. Inject 20 units at breakfast) 10 pen 5  . metFORMIN (GLUCOPHAGE) 500 MG tablet TAKE TWO TABLETS BY MOUTH TWICE DAILY WITH A MEAL for diabetes. 360 tablet 3  . metoprolol tartrate (LOPRESSOR) 50 MG tablet TAKE 1/2 (ONE-HALF) TABLET BY MOUTH TWICE DAILY 45 tablet 3  . pantoprazole (PROTONIX) 40 MG tablet Take 1 tablet (40 mg total) by mouth daily. 90 tablet 3  . tamsulosin (FLOMAX) 0.4 MG CAPS capsule Take two capsule daily 180 capsule 4   No current facility-administered medications on file prior to visit.     BP (!) 186/80   Pulse (!) 58   Temp 98.2 F (36.8 C) (Oral)   Ht 5\' 6"  (1.676 m)   Wt 173 lb 8 oz (78.7 kg)   SpO2 98%    BMI 28.00 kg/m    Objective:   Physical Exam  Constitutional: He appears well-nourished.  Neck: Neck supple.  Cardiovascular: Normal rate and regular rhythm.  Respiratory: Effort normal and breath sounds normal.  Skin: Skin is warm and dry.           Assessment & Plan:

## 2017-12-24 ENCOUNTER — Encounter: Payer: Self-pay | Admitting: Primary Care

## 2017-12-24 ENCOUNTER — Ambulatory Visit (INDEPENDENT_AMBULATORY_CARE_PROVIDER_SITE_OTHER): Payer: Medicare Other | Admitting: Primary Care

## 2017-12-24 VITALS — BP 170/82 | HR 67 | Temp 98.2°F | Ht 66.0 in | Wt 170.5 lb

## 2017-12-24 DIAGNOSIS — I2510 Atherosclerotic heart disease of native coronary artery without angina pectoris: Secondary | ICD-10-CM

## 2017-12-24 DIAGNOSIS — I10 Essential (primary) hypertension: Secondary | ICD-10-CM

## 2017-12-24 MED ORDER — LISINOPRIL-HYDROCHLOROTHIAZIDE 20-25 MG PO TABS: 1.0000 | ORAL_TABLET | Freq: Every day | ORAL | 0 refills | Status: DC

## 2017-12-24 NOTE — Patient Instructions (Signed)
Stop ramipril.   Start lisinopril-hydrochlorothiazide 20-25 mg tablets for blood pressure. Take 1 tablet once daily.  Schedule a follow up visit in 2 weeks for blood pressure check.  It was a pleasure to see you today!

## 2017-12-24 NOTE — Progress Notes (Signed)
Subjective:    Patient ID: Antonio [name], male    DOB: 11-Nov-1945, 72 y.o.   MRN: [id number]  HPI  Antonio [name] is a 72 year old male with a history of CAD, hypertension, PAD, diabetes who presents today for follow up of hypertension.  He was last evaluated on 12/14/17 with reports of elevated home readings and also several elevated documented office readings. His ramipril was increased to 20 mg and his metoprolol tartrate 25 mg BID was continued.  Since his last visit he's checking readings at home which are running 220's/100's. He denies dizziness, chest pain.   BP Readings from Last 3 Encounters:  12/24/17 (!) 170/82  12/14/17 (!) 186/80  12/12/17 (!) 182/77    Review of Systems  Respiratory: Negative for shortness of breath.   Cardiovascular: Negative for chest pain.  Neurological: Negative for dizziness and headaches.       Past Medical History:  Diagnosis Date  . 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 Coronary atherosclerosis of unspecified type of vessel, native or graft   . Diabetes mellitus    Type II  . Esophageal stricture   . Gastritis   . Hiatal hernia   . Hx of colonic polyps   . Hyperlipidemia   . Hypertension   . Internal hemorrhoids without mention of complication   . Lumbago   . PSA elevation    now averaging 2's  . Tobacco abuse   . URI (upper respiratory infection)   . Urinary retention      Social History   Socioeconomic History  . Marital status: Married    Spouse name: [name]  . Number of children: 3  . Years of education: Not on file  . Highest education level: Not on file  Occupational History  . Occupation: Retired Animal nutritionist  . Financial resource strain: Not on file  . Food insecurity:    Worry: Not on file    Inability: Not on file  . Transportation needs:    Medical: Not on file    Non-medical: Not on file  Tobacco Use  . Smoking status: Current Every Day Smoker    Packs/day: 1.50    Years: 40.00    Pack years: 60.00    Types: Cigarettes  . Smokeless tobacco: Current User  Substance and Sexual Activity  . Alcohol use: Yes    Alcohol/week: 0.0 standard drinks    Comment: rare  . Drug use: No  . Sexual activity: Not on file  Lifestyle  . Physical activity:    Days per week: Not on file    Minutes per session: Not on file  . Stress: Not on file  Relationships  . Social connections:    Talks on phone: Not on file    Gets together: Not on file    Attends religious service: Not on file    Active member of club or organization: Not on file    Attends meetings of clubs or organizations: Not on file    Relationship status: Not on file  . Intimate partner violence:    Fear of current or ex partner: Not on file    Emotionally abused: Not on file    Physically abused: Not on file    Forced sexual activity: Not on file  Other Topics Concern  . Not on file  Social History Narrative   Married 9/09 wife with moderate memory problems.   3 adult children,  2 grandchildren   Would desire CPR    Past Surgical History:  Procedure Laterality Date  . 2D Echo  06/03  . Abdominal ultrasound  06/03   Negative  . CT of head  and sinuses  06/03   Negative  . CT of the chest, abdomen, pelvis  06/03   Chest negative; Abdomen, left adrenal lesion; Pelvis, enlarged prostate  . ESOPHAGOGASTRODUODENOSCOPY  02/13/07   gastritis and duodenitis without bleed  . ESOPHAGOGASTRODUODENOSCOPY N/A 06/26/2014   Procedure: ESOPHAGOGASTRODUODENOSCOPY (EGD);  Surgeon: Milus Banister, MD;  Location: [facility];  Service: Endoscopy;  Laterality: N/A;  . KNEE ARTHROSCOPY  10/00   Right  . LP  06/03  . Microwave thermotherapy prostate  08/28/07   Rogers Blocker  . Persantine cardiolite  03/01   EF 68%  . POLYPECTOMY  11/95  . Stress myoview  07/06   EF 67%    Family History  Problem Relation Age of Onset  . Diabetes Father   . Alcohol abuse Paternal Uncle   . Alcohol abuse Paternal Uncle     . Alcohol abuse Paternal Uncle   . Rheum arthritis Mother   . Heart disease Neg Hx   . Stroke Neg Hx   . Cancer Neg Hx   . Drug abuse Neg Hx   . Depression Neg Hx   . Colon cancer Neg Hx   . Rectal cancer Neg Hx   . Stomach cancer Neg Hx   . Kidney cancer Neg Hx   . Bladder Cancer Neg Hx   . Prostate cancer Neg Hx     No Known Allergies  Current Outpatient Medications on File Prior to Visit  Medication Sig Dispense Refill  . aspirin 81 MG tablet Take 81 mg by mouth daily.    [name] atorvastatin (LIPITOR) 20 MG tablet Take 1 tablet by mouth every evening for cholesterol. 90 tablet 3  . finasteride (PROSCAR) 5 MG tablet Take 1 tablet (5 mg total) by mouth daily. 90 tablet 3  . glipiZIDE (GLUCOTROL) 10 MG tablet Take 10 mg by mouth daily before supper.     [name] glucose blood ([employer] TEST STRIPS) test strip Use one strip three times daily.    . insulin detemir (LEVEMIR) 100 UNIT/ML injection Inject 20 units at bedtime. (Patient taking differently: every morning. Inject 20 units at breakfast) 10 pen 5  . metFORMIN (GLUCOPHAGE) 500 MG tablet TAKE TWO TABLETS BY MOUTH TWICE DAILY WITH A MEAL for diabetes. 360 tablet 3  . metoprolol tartrate (LOPRESSOR) 50 MG tablet TAKE 1/2 (ONE-HALF) TABLET BY MOUTH TWICE DAILY 45 tablet 3  . pantoprazole (PROTONIX) 40 MG tablet Take 1 tablet (40 mg total) by mouth daily. 90 tablet 3  . tamsulosin (FLOMAX) 0.4 MG CAPS capsule Take two capsule daily 180 capsule 4   No current facility-administered medications on file prior to visit.     BP (!) 170/82 (BP Location: Left Arm, Patient Position: Sitting, Cuff Size: Normal)   Pulse 67   Temp 98.2 F (36.8 C) (Oral)   Ht 5\' 6"  (1.676 m)   Wt 170 lb 8 oz (77.3 kg)   SpO2 97%   BMI 27.52 kg/m    Objective:   Physical Exam  Constitutional: He appears well-nourished.  Neck: Neck supple.  Cardiovascular: Normal rate and regular rhythm.  Respiratory: Effort normal and breath sounds normal.  Skin: Skin is  warm and dry.           Assessment & Plan:

## 2017-12-24 NOTE — Assessment & Plan Note (Signed)
Slightly improved but not near to goal. Stop ramipril. Start lisinopril-hydrochlorothiazide 20-25 mg. Follow up in 2 weeks for BP check and BMP.

## 2017-12-25 ENCOUNTER — Other Ambulatory Visit (INDEPENDENT_AMBULATORY_CARE_PROVIDER_SITE_OTHER): Payer: Self-pay | Admitting: Vascular Surgery

## 2017-12-25 ENCOUNTER — Encounter
Admission: RE | Admit: 2017-12-25 | Discharge: 2017-12-25 | Disposition: A | Discharge status: Home / Self Care | Payer: Medicare Other | Source: Ambulatory Visit | Attending: Vascular Surgery | Admitting: Vascular Surgery

## 2017-12-25 DIAGNOSIS — E11618 Type 2 diabetes mellitus with other diabetic arthropathy: Secondary | ICD-10-CM | POA: Diagnosis not present

## 2017-12-25 DIAGNOSIS — I10 Essential (primary) hypertension: Secondary | ICD-10-CM | POA: Diagnosis not present

## 2017-12-25 DIAGNOSIS — Z79899 Other long term (current) drug therapy: Secondary | ICD-10-CM | POA: Diagnosis not present

## 2017-12-25 DIAGNOSIS — E7800 Pure hypercholesterolemia, unspecified: Secondary | ICD-10-CM | POA: Diagnosis not present

## 2017-12-25 DIAGNOSIS — F17210 Nicotine dependence, cigarettes, uncomplicated: Secondary | ICD-10-CM | POA: Diagnosis not present

## 2017-12-25 DIAGNOSIS — R339 Retention of urine, unspecified: Secondary | ICD-10-CM | POA: Diagnosis not present

## 2017-12-25 DIAGNOSIS — N401 Enlarged prostate with lower urinary tract symptoms: Secondary | ICD-10-CM | POA: Diagnosis not present

## 2017-12-25 DIAGNOSIS — Z833 Family history of diabetes mellitus: Secondary | ICD-10-CM | POA: Diagnosis not present

## 2017-12-25 DIAGNOSIS — Z794 Long term (current) use of insulin: Secondary | ICD-10-CM | POA: Diagnosis not present

## 2017-12-25 DIAGNOSIS — Z8249 Family history of ischemic heart disease and other diseases of the circulatory system: Secondary | ICD-10-CM | POA: Diagnosis not present

## 2017-12-25 DIAGNOSIS — I70213 Atherosclerosis of native arteries of extremities with intermittent claudication, bilateral legs: Secondary | ICD-10-CM | POA: Diagnosis not present

## 2017-12-25 DIAGNOSIS — Z01812 Encounter for preprocedural laboratory examination: Secondary | ICD-10-CM | POA: Insufficient documentation

## 2017-12-25 DIAGNOSIS — Z823 Family history of stroke: Secondary | ICD-10-CM | POA: Diagnosis not present

## 2017-12-25 DIAGNOSIS — Z7982 Long term (current) use of aspirin: Secondary | ICD-10-CM | POA: Diagnosis not present

## 2017-12-25 DIAGNOSIS — Z86010 Personal history of colonic polyps: Secondary | ICD-10-CM | POA: Diagnosis not present

## 2017-12-25 DIAGNOSIS — Z9889 Other specified postprocedural states: Secondary | ICD-10-CM | POA: Diagnosis not present

## 2017-12-25 HISTORY — DX: Pain in left lower leg: M79.662

## 2017-12-25 HISTORY — DX: Pain in right lower leg: M79.661

## 2017-12-25 LAB — CREATININE, SERUM
Creatinine, Ser: 0.66 mg/dL (ref 0.61–1.24)
GFR calc non Af Amer: >60 mL/min (ref >60)

## 2017-12-25 LAB — BUN: BUN: 20 mg/dL (ref 8–23)

## 2017-12-27 ENCOUNTER — Ambulatory Visit
Admission: RE | Admit: 2017-12-27 | Discharge: 2017-12-27 | Disposition: A | Discharge status: Home / Self Care | Payer: Medicare Other | Source: Ambulatory Visit | Attending: Vascular Surgery | Admitting: Vascular Surgery

## 2017-12-27 ENCOUNTER — Encounter: Payer: Self-pay | Admitting: *Deleted

## 2017-12-27 ENCOUNTER — Encounter
Admission: RE | Disposition: A | Discharge status: Home / Self Care | Payer: Self-pay | Source: Ambulatory Visit | Attending: Vascular Surgery

## 2017-12-27 DIAGNOSIS — E11618 Type 2 diabetes mellitus with other diabetic arthropathy: Secondary | ICD-10-CM | POA: Diagnosis not present

## 2017-12-27 DIAGNOSIS — R339 Retention of urine, unspecified: Secondary | ICD-10-CM | POA: Insufficient documentation

## 2017-12-27 DIAGNOSIS — Z7982 Long term (current) use of aspirin: Secondary | ICD-10-CM | POA: Insufficient documentation

## 2017-12-27 DIAGNOSIS — I10 Essential (primary) hypertension: Secondary | ICD-10-CM | POA: Diagnosis not present

## 2017-12-27 DIAGNOSIS — Z833 Family history of diabetes mellitus: Secondary | ICD-10-CM | POA: Insufficient documentation

## 2017-12-27 DIAGNOSIS — Z823 Family history of stroke: Secondary | ICD-10-CM | POA: Insufficient documentation

## 2017-12-27 DIAGNOSIS — Z79899 Other long term (current) drug therapy: Secondary | ICD-10-CM | POA: Insufficient documentation

## 2017-12-27 DIAGNOSIS — Z8249 Family history of ischemic heart disease and other diseases of the circulatory system: Secondary | ICD-10-CM | POA: Insufficient documentation

## 2017-12-27 DIAGNOSIS — Z86010 Personal history of colonic polyps: Secondary | ICD-10-CM | POA: Diagnosis not present

## 2017-12-27 DIAGNOSIS — F17210 Nicotine dependence, cigarettes, uncomplicated: Secondary | ICD-10-CM | POA: Insufficient documentation

## 2017-12-27 DIAGNOSIS — N401 Enlarged prostate with lower urinary tract symptoms: Secondary | ICD-10-CM | POA: Insufficient documentation

## 2017-12-27 DIAGNOSIS — Z794 Long term (current) use of insulin: Secondary | ICD-10-CM | POA: Insufficient documentation

## 2017-12-27 DIAGNOSIS — I739 Peripheral vascular disease, unspecified: Secondary | ICD-10-CM

## 2017-12-27 DIAGNOSIS — Z9889 Other specified postprocedural states: Secondary | ICD-10-CM | POA: Insufficient documentation

## 2017-12-27 DIAGNOSIS — E7800 Pure hypercholesterolemia, unspecified: Secondary | ICD-10-CM | POA: Insufficient documentation

## 2017-12-27 DIAGNOSIS — I70213 Atherosclerosis of native arteries of extremities with intermittent claudication, bilateral legs: Secondary | ICD-10-CM | POA: Diagnosis not present

## 2017-12-27 HISTORY — PX: LOWER EXTREMITY ANGIOGRAPHY: CATH118251

## 2017-12-27 LAB — GLUCOSE, CAPILLARY: Glucose-Capillary: 134 mg/dL — ABNORMAL HIGH (ref 70–99)

## 2017-12-27 SURGERY — LOWER EXTREMITY ANGIOGRAPHY
Anesthesia: Moderate Sedation | Laterality: Right

## 2017-12-27 MED ORDER — ONDANSETRON HCL 4 MG/2ML IJ SOLN: 4.0000 mg | Freq: Four times a day (QID) | INTRAMUSCULAR | Status: DC | PRN

## 2017-12-27 MED ORDER — MIDAZOLAM HCL 2 MG/2ML IJ SOLN
INTRAMUSCULAR | Status: DC | PRN
  Administered 2017-12-27 (×3): 1 mg via INTRAVENOUS
  Administered 2017-12-27: 2 mg via INTRAVENOUS

## 2017-12-27 MED ORDER — ACETAMINOPHEN 325 MG PO TABS: 650.0000 mg | ORAL_TABLET | ORAL | Status: DC | PRN

## 2017-12-27 MED ORDER — FAMOTIDINE 20 MG PO TABS: 40.0000 mg | ORAL_TABLET | ORAL | Status: DC | PRN

## 2017-12-27 MED ORDER — SODIUM CHLORIDE 0.9 % IV SOLN: INTRAVENOUS | Status: DC

## 2017-12-27 MED ORDER — CEFAZOLIN SODIUM-DEXTROSE 2-4 GM/100ML-% IV SOLN
INTRAVENOUS | Status: AC
  Filled 2017-12-27: qty 100

## 2017-12-27 MED ORDER — FENTANYL CITRATE (PF) 100 MCG/2ML IJ SOLN
INTRAMUSCULAR | Status: DC | PRN
  Administered 2017-12-27 (×2): 50 ug via INTRAVENOUS
  Administered 2017-12-27: 25 ug via INTRAVENOUS
  Administered 2017-12-27: 50 ug via INTRAVENOUS
  Administered 2017-12-27: 25 ug via INTRAVENOUS

## 2017-12-27 MED ORDER — LABETALOL HCL 5 MG/ML IV SOLN: 10.0000 mg | INTRAVENOUS | Status: DC | PRN

## 2017-12-27 MED ORDER — HEPARIN (PORCINE) IN NACL 1000-0.9 UT/500ML-% IV SOLN
INTRAVENOUS | Status: AC
  Filled 2017-12-27: qty 1000

## 2017-12-27 MED ORDER — FENTANYL CITRATE (PF) 100 MCG/2ML IJ SOLN
INTRAMUSCULAR | Status: AC
  Filled 2017-12-27: qty 2

## 2017-12-27 MED ORDER — HEPARIN SODIUM (PORCINE) 1000 UNIT/ML IJ SOLN
INTRAMUSCULAR | Status: DC | PRN
  Administered 2017-12-27: 5000 [IU] via INTRAVENOUS

## 2017-12-27 MED ORDER — HYDROMORPHONE HCL 1 MG/ML IJ SOLN: 1.0000 mg | Freq: Once | INTRAMUSCULAR | Status: DC | PRN

## 2017-12-27 MED ORDER — NITROGLYCERIN 1 MG/10 ML FOR IR/CATH LAB
INTRA_ARTERIAL | Status: DC | PRN
  Administered 2017-12-27: 250 ug

## 2017-12-27 MED ORDER — SODIUM CHLORIDE 0.9% FLUSH: 3.0000 mL | INTRAVENOUS | Status: DC | PRN

## 2017-12-27 MED ORDER — NITROGLYCERIN 5 MG/ML IV SOLN
INTRAVENOUS | Status: AC
  Filled 2017-12-27: qty 10

## 2017-12-27 MED ORDER — SODIUM CHLORIDE 0.9 % IV SOLN: 250.0000 mL | INTRAVENOUS | Status: DC | PRN

## 2017-12-27 MED ORDER — MIDAZOLAM HCL 5 MG/5ML IJ SOLN
INTRAMUSCULAR | Status: AC
  Filled 2017-12-27: qty 5

## 2017-12-27 MED ORDER — METHYLPREDNISOLONE SODIUM SUCC 125 MG IJ SOLR: 125.0000 mg | INTRAMUSCULAR | Status: DC | PRN

## 2017-12-27 MED ORDER — IOPAMIDOL (ISOVUE-300) INJECTION 61%
INTRAVENOUS | Status: DC | PRN
  Administered 2017-12-27: 70 mL via INTRA_ARTERIAL

## 2017-12-27 MED ORDER — SODIUM CHLORIDE (PF) 0.9 % IJ SOLN
INTRAMUSCULAR | Status: AC
  Filled 2017-12-27: qty 50

## 2017-12-27 MED ORDER — CLOPIDOGREL BISULFATE 75 MG PO TABS: 75.0000 mg | ORAL_TABLET | Freq: Every day | ORAL | Status: DC

## 2017-12-27 MED ORDER — CEFAZOLIN SODIUM-DEXTROSE 2-4 GM/100ML-% IV SOLN
2.0000 g | Freq: Once | INTRAVENOUS | Status: AC
  Administered 2017-12-27: 2 g via INTRAVENOUS

## 2017-12-27 MED ORDER — CLOPIDOGREL BISULFATE 75 MG PO TABS: 75.0000 mg | ORAL_TABLET | Freq: Every day | ORAL | 11 refills | Status: DC

## 2017-12-27 MED ORDER — HEPARIN SODIUM (PORCINE) 1000 UNIT/ML IJ SOLN
INTRAMUSCULAR | Status: AC
  Filled 2017-12-27: qty 1

## 2017-12-27 MED ORDER — HYDRALAZINE HCL 20 MG/ML IJ SOLN
5.0000 mg | INTRAMUSCULAR | Status: AC | PRN
  Administered 2017-12-27 (×2): 5 mg via INTRAVENOUS

## 2017-12-27 MED ORDER — LIDOCAINE-EPINEPHRINE (PF) 1 %-1:200000 IJ SOLN
INTRAMUSCULAR | Status: AC
  Filled 2017-12-27: qty 10

## 2017-12-27 MED ORDER — HYDRALAZINE HCL 20 MG/ML IJ SOLN
INTRAMUSCULAR | Status: AC
  Administered 2017-12-27: 5 mg via INTRAVENOUS
  Filled 2017-12-27: qty 1

## 2017-12-27 MED ORDER — SODIUM CHLORIDE 0.9% FLUSH: 3.0000 mL | Freq: Two times a day (BID) | INTRAVENOUS | Status: DC

## 2017-12-27 SURGICAL SUPPLY — 24 items
BALLN LUTONIX 018 5X220X130 (BALLOONS) ×3
BALLN LUTONIX DCB 5X60X130 (BALLOONS) ×3
BALLN LUTONIX DCB 7X60X130 (BALLOONS) ×3
BALLN ULTRVRSE 018 2.5X150X150 (BALLOONS) ×3
BALLN ULTRVRSE 3X300X150 (BALLOONS) ×3
BALLN ULTRVRSE 3X300X150 OTW (BALLOONS) ×1
BALLOON LUTONIX 018 5X220X130 (BALLOONS) IMPLANT
BALLOON LUTONIX DCB 5X60X130 (BALLOONS) IMPLANT
BALLOON LUTONIX DCB 7X60X130 (BALLOONS) IMPLANT
BALLOON ULTRVRSE 3X300X150 OTW (BALLOONS) IMPLANT
BALLOON ULTRVS 018 2.5X150X150 (BALLOONS) IMPLANT
CATH BEACON 5 .038 100 VERT TP (CATHETERS) ×2 IMPLANT
CATH PIG 70CM (CATHETERS) ×2 IMPLANT
DEVICE PRESTO INFLATION (MISCELLANEOUS) ×2 IMPLANT
DEVICE STARCLOSE SE CLOSURE (Vascular Products) ×2 IMPLANT
GLIDEWIRE ADV .035X260CM (WIRE) ×2 IMPLANT
PACK ANGIOGRAPHY (CUSTOM PROCEDURE TRAY) ×3 IMPLANT
SHEATH ANL2 6FRX45 HC (SHEATH) ×2 IMPLANT
SHEATH BRITE TIP 5FRX11 (SHEATH) ×2 IMPLANT
STENT VIABAHN 6X150X120 (Permanent Stent) ×2 IMPLANT
SYR MEDRAD MARK V 150ML (SYRINGE) ×2 IMPLANT
TUBING CONTRAST HIGH PRESS 72 (TUBING) ×2 IMPLANT
WIRE G V18X300CM (WIRE) ×6 IMPLANT
WIRE J 3MM .035X145CM (WIRE) ×2 IMPLANT

## 2017-12-27 NOTE — Op Note (Signed)
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  Ultrasound guidance for vascular access left femoral artery             2.  Catheter placement into right common femoral artery from left femoral approach             3.  Aortogram and selective right lower extremity angiogram             4.  Percutaneous transluminal angioplasty of right peroneal artery with 2.5 mm diameter and 3 mm diameter angioplasty balloons             5.   Percutaneous transluminal angioplasty of the right anterior tibial artery with 3 mm diameter angioplasty balloon  6.  Percutaneous transluminal angioplasty of the right SFA and above-knee popliteal artery with 5 mm diameter by 22 cm length Lutonix drug-coated angioplasty balloon  7.  Viabahn stent placement to the right SFA with 6 mm diameter by 15 cm length stent for greater than 50% residual stenosis after angioplasty  8.  Percutaneous transluminal angioplasty of right common femoral artery with 5 mm diameter by 6 cm length Lutonix drug-coated angioplasty balloon  9.  Percutaneous transluminal angioplasty of the right external iliac artery with 7 mm diameter by 6 cm length Lutonix drug-coated angioplasty balloon             10.  StarClose closure device left femoral artery  EBL: 10 cc  Contrast: 70 cc  Fluoro Time: 11.5 minutes  Moderate Conscious Sedation Time: approximately 55 minutes using 5 mg of Versed and 200 mcg of Fentanyl              Indications:  Patient is a 72 y.o.male with disabling claudication symptoms and some early rest pain in both legs. The patient has noninvasive study showing moderate to severely reduced perfusion bilaterally. The patient is brought in for angiography for  further evaluation and potential treatment.  Due to the limb threatening nature of the situation, angiogram was performed for attempted limb salvage. The patient is aware that if the procedure fails, amputation would be expected.  The patient also understands that even with successful revascularization, amputation may still be required due to the severity of the situation. Risks and benefits are discussed and informed consent is obtained.   Procedure:  The patient was identified and appropriate procedural time out was performed.  The patient was then placed supine on the table and prepped and draped in the usual sterile fashion. Moderate conscious sedation was administered during a face to face encounter with the patient throughout the procedure with my supervision of the RN administering medicines and monitoring the patient's vital signs, pulse oximetry, telemetry and mental status throughout from the start of the procedure until the patient was taken to the recovery room. Ultrasound was used to evaluate the left common femoral artery.  It was heavily diseased but did have flow.  A digital ultrasound image was acquired.  A Seldinger needle was used to access the left common femoral artery under direct ultrasound guidance and a permanent image was performed.  A 0.035 J wire was advanced without resistance and a 5Fr sheath was placed.  Pigtail catheter was placed into the aorta  and an AP aortogram was performed. This demonstrated that the renal arteries were normal bilaterally.  The aorta was normal.  The left common iliac artery had what appeared to be a 30 to 40% stenosis.  The right common iliac artery was mildly irregular.  The left external iliac artery did not have significant stenosis until its most distal portion and the common femoral artery which had a fairly high-grade stenosis.  The right external iliac artery had about a 60 to 70% stenosis proximally and about a 30 to 40% stenosis distally. I then  crossed the aortic bifurcation and advanced to the right femoral head and the right common femoral artery. Selective right lower extremity angiogram was then performed. This demonstrated about an 80% stenosis of the common femoral artery.  The SFA was diffusely diseased and had a short segment occlusion in the mid to distal segment.  The vessel normalized in the mid popliteal artery and there was three-vessel runoff distally although all 3 vessels had significant disease.  The anterior tibial artery had about a 90 to 95% stenosis about 7 to 8 cm at its origin.  The peroneal artery had about an 80% stenosis 3 to 5 cm beyond its origin.  The posterior tibial artery had about a 50 to 60% stenosis about 5 cm beyond its origin. It was felt that it was in the patient's best interest to proceed with intervention after these images to avoid a second procedure and a larger amount of contrast and fluoroscopy based off of the findings from the initial angiogram. The patient was systemically heparinized and a 6 [place] Ansell sheath was then placed over the [employer] wire. I then used a Kumpe catheter and the advantage wire to navigate through the SFA and popliteal lesions and firm intraluminal flow in the below-knee popliteal artery.  I then advance the Kumpe catheter with a V 18 wire into the peroneal artery and cross the stenosis.  After selective imaging, a 2.5 mm diameter by 15 cm length angioplasty balloon was inflated to 14 atm for 1 minute.  There was still greater than 50% stenosis so upsized to a 3 mm balloon and inflated this to 10 atm for 1 minute.  Following this, there is only about a 20% residual stenosis.  I then used a Kumpe catheter and the V 18 wire to navigate down the anterior tibial artery and cross the high-grade stenosis in the proximal anterior tibial artery parking the wire down at the ankle.  After selective imaging, a 3 mm diameter balloon was inflated to 8 atm for 1 minute.  Completion imaging  showed about a 30% residual stenosis.  I then turned my attention to the SFA and popliteal lesion.  A 5 mm diameter by 22 cm length Lutonix drug-coated angioplasty balloon was inflated to 10 atm for 1 minute.  Completion imaging showed the mid and distal SFA to have residual disease of greater than 50% and so a 6 mm diameter by 15 cm length Viabahn stent was deployed and postdilated with a 5 mm there was only about a 10 to 15% residual stenosis following this.  I gently treated the right common femoral artery with a 5 mm diameter by 6 cm length Lutonix drug-coated angioplasty balloon inflated to 8 atm for 1 minute.  Completion imaging still showed about a 60% residual stenosis, but this was in a zone where were not going to place a stent and I accepted the mild improvement.  Attention was then turned  to the right external iliac lesion.  A 7 mm diameter by 6 cm length Lutonix drug-coated angioplasty balloon was inflated to 10 atm for 1 minute.  Completion imaging showed about a 20% residual stenosis in the right external iliac artery that was not flow-limiting. I elected to terminate the procedure. The sheath was removed and StarClose closure device was deployed in the left femoral artery with excellent hemostatic result. The patient was taken to the recovery room in stable condition having tolerated the procedure well.  Findings:               Aortogram:  Renal arteries were normal bilaterally.  The aorta was normal.  The left common iliac artery had what appeared to be a 30 to 40% stenosis.  The right common iliac artery was mildly irregular.  The left external iliac artery did not have significant stenosis until its most distal portion and the common femoral artery which had a fairly high-grade stenosis.  The right external iliac artery had about a 60 to 70% stenosis proximally and about a 30 to 40% stenosis distally.             Right lower Extremity:  This demonstrated about an 80% stenosis of the common  femoral artery.  The SFA was diffusely diseased and had a short segment occlusion in the mid to distal segment.  The vessel normalized in the mid popliteal artery and there was three-vessel runoff distally although all 3 vessels had significant disease.  The anterior tibial artery had about a 90 to 95% stenosis about 7 to 8 cm at its origin.  The peroneal artery had about an 80% stenosis 3 to 5 cm beyond its origin.  The posterior tibial artery had about a 50 to 60% stenosis about 5 cm beyond its origin   Disposition: Patient was taken to the recovery room in stable condition having tolerated the procedure well.  Complications: None  Leotis Pain 12/27/2017 11:12 AM   This note was created with Dragon Medical transcription system. Any errors in dictation are purely unintentional.

## 2017-12-27 NOTE — H&P (Signed)
[facility] History & Physical Update  The patient was interviewed and re-examined.  The patient's previous History and Physical has been reviewed and is unchanged.  There is no change in the plan of care. We plan to proceed with the scheduled procedure.  Leotis Pain, MD  12/27/2017, 8:47 AM

## 2018-01-07 ENCOUNTER — Ambulatory Visit (INDEPENDENT_AMBULATORY_CARE_PROVIDER_SITE_OTHER): Payer: Medicare Other | Admitting: Primary Care

## 2018-01-07 ENCOUNTER — Encounter: Payer: Self-pay | Admitting: Primary Care

## 2018-01-07 DIAGNOSIS — I10 Essential (primary) hypertension: Secondary | ICD-10-CM

## 2018-01-07 DIAGNOSIS — I2510 Atherosclerotic heart disease of native coronary artery without angina pectoris: Secondary | ICD-10-CM | POA: Diagnosis not present

## 2018-01-07 NOTE — Patient Instructions (Signed)
Continue taking lisinopril-hydrochlorothiazide 20-25 mg tablets for blood pressure.  Please call me when you're ready to start treatment to stop smoking.  It was a pleasure to see you today!

## 2018-01-07 NOTE — Progress Notes (Signed)
Subjective:    Patient ID: Antonio [name], male    DOB: 09/10/1945, 72 y.o.   MRN: [id number]  HPI  Antonio [name] is a 72 year old male who presents today for follow up of hypertension. He would like to stop smoking.   He was last evaluated on 12/24/17 with continued elevated BP readings despite increased doses of ramipril. His regimen last visit was changed to lisinopril-HCTZ 20-25 mg.   Since his last visit he's compliant to his medication daily. He denies dizziness, headaches. He will undergo left angiography this Thursday to his left lower extremity.   BP Readings from Last 3 Encounters:  01/07/18 140/80  12/27/17 (!) 134/57  12/25/17 (!) 166/78   He was managed on Zyban 10 years ago for tobacco abuse, quit for four years. He would like to try this after his procedure later this week. He will call to request medication.   Review of Systems  Respiratory: Negative for shortness of breath.   Cardiovascular: Negative for chest pain.  Neurological: Negative for dizziness and headaches.       Past Medical History:  Diagnosis Date  . 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 Coronary atherosclerosis of unspecified type of vessel, native or graft   . Diabetes mellitus    Type II  . Esophageal stricture   . Gastritis   . Hiatal hernia   . Hx of colonic polyps   . Hyperlipidemia   . Hypertension   . Internal hemorrhoids without mention of complication   . Lumbago   . Pain in both lower legs   . PSA elevation    now averaging 2's  . Tobacco abuse   . URI (upper respiratory infection)   . Urinary retention      Social History   Socioeconomic History  . Marital status: Married    Spouse name: [name]  . Number of children: 3  . Years of education: Not on file  . Highest education level: Not on file  Occupational History  . Occupation: Retired Animal nutritionist  . Financial resource strain: Not hard at all  . Food insecurity:    Worry: Not  on file    Inability: Not on file  . Transportation needs:    Medical: No    Non-medical: No  Tobacco Use  . Smoking status: Current Every Day Smoker    Packs/day: 1.50    Years: 40.00    Pack years: 60.00    Types: Cigarettes  . Smokeless tobacco: Current User  Substance and Sexual Activity  . Alcohol use: Yes    Alcohol/week: 0.0 standard drinks    Comment: rare  . Drug use: No  . Sexual activity: Not on file  Lifestyle  . Physical activity:    Days per week: Not on file    Minutes per session: Not on file  . Stress: Not on file  Relationships  . Social connections:    Talks on phone: Not on file    Gets together: Not on file    Attends religious service: Not on file    Active member of club or organization: Not on file    Attends meetings of clubs or organizations: Not on file    Relationship status: Not on file  . Intimate partner violence:    Fear of current or ex partner: No    Emotionally abused: Not on file    Physically abused: Not on file  Forced sexual activity: Not on file  Other Topics Concern  . Not on file  Social History Narrative   Married 9/09 wife with moderate memory problems.   3 adult children, 2 grandchildren   Would desire CPR    Past Surgical History:  Procedure Laterality Date  . 2D Echo  06/03  . Abdominal ultrasound  06/03   Negative  . BACK SURGERY    . CT of head  and sinuses  06/03   Negative  . CT of the chest, abdomen, pelvis  06/03   Chest negative; Abdomen, left adrenal lesion; Pelvis, enlarged prostate  . ESOPHAGOGASTRODUODENOSCOPY  02/13/07   gastritis and duodenitis without bleed  . ESOPHAGOGASTRODUODENOSCOPY N/A 06/26/2014   Procedure: ESOPHAGOGASTRODUODENOSCOPY (EGD);  Surgeon: Milus Banister, MD;  Location: [facility];  Service: Endoscopy;  Laterality: N/A;  . KNEE ARTHROSCOPY  10/00   Right  . LOWER EXTREMITY ANGIOGRAPHY Right 12/27/2017   Procedure: LOWER EXTREMITY ANGIOGRAPHY;  Surgeon: Algernon Huxley, MD;   Location: [facility];  Service: Cardiovascular;  Laterality: Right;  . LP  06/03  . Microwave thermotherapy prostate  08/28/07   [facility] Blocker  . Persantine cardiolite  03/01   EF 68%  . POLYPECTOMY  11/95  . Stress myoview  07/06   EF 67%    Family History  Problem Relation Age of Onset  . Diabetes Father   . Alcohol abuse Paternal Uncle   . Alcohol abuse Paternal Uncle   . Alcohol abuse Paternal Uncle   . Rheum arthritis Mother   . Heart disease Neg Hx   . Stroke Neg Hx   . Cancer Neg Hx   . Drug abuse Neg Hx   . Depression Neg Hx   . Colon cancer Neg Hx   . Rectal cancer Neg Hx   . Stomach cancer Neg Hx   . Kidney cancer Neg Hx   . Bladder Cancer Neg Hx   . Prostate cancer Neg Hx     No Known Allergies  Current Outpatient Medications on File Prior to Visit  Medication Sig Dispense Refill  . aspirin 81 MG tablet Take 81 mg by mouth daily.    [name] atorvastatin (LIPITOR) 20 MG tablet Take 1 tablet by mouth every evening for cholesterol. 90 tablet 3  . clopidogrel (PLAVIX) 75 MG tablet Take 1 tablet (75 mg total) by mouth daily. 30 tablet 11  . finasteride (PROSCAR) 5 MG tablet Take 1 tablet (5 mg total) by mouth daily. 90 tablet 3  . glipiZIDE (GLUCOTROL) 10 MG tablet Take 10 mg by mouth daily before supper.     [name] glucose blood ([employer] TEST STRIPS) test strip Use one strip three times daily.    . insulin detemir (LEVEMIR) 100 UNIT/ML injection Inject 20 units at bedtime. (Patient taking differently: every morning. Inject 20 units at breakfast) 10 pen 5  . lisinopril-hydrochlorothiazide (PRINZIDE,ZESTORETIC) 20-25 MG tablet Take 1 tablet by mouth daily. For blood pressure. 90 tablet 0  . metFORMIN (GLUCOPHAGE) 500 MG tablet TAKE TWO TABLETS BY MOUTH TWICE DAILY WITH A MEAL for diabetes. 360 tablet 3  . metoprolol tartrate (LOPRESSOR) 50 MG tablet TAKE 1/2 (ONE-HALF) TABLET BY MOUTH TWICE DAILY 45 tablet 3  . pantoprazole (PROTONIX) 40 MG tablet Take 1 tablet (40 mg total) by  mouth daily. 90 tablet 3  . tamsulosin (FLOMAX) 0.4 MG CAPS capsule Take two capsule daily 180 capsule 4   No current facility-administered medications on file prior to visit.  BP 140/80   Pulse 63   Temp 98.1 F (36.7 C) (Oral)   Ht 5\' 6"  (1.676 m)   Wt 168 lb 8 oz (76.4 kg)   SpO2 95%   BMI 27.20 kg/m    Objective:   Physical Exam  Constitutional: He appears well-nourished.  Neck: Neck supple.  Cardiovascular: Normal rate and regular rhythm.  Respiratory: Effort normal and breath sounds normal.  Skin: Skin is warm and dry.           Assessment & Plan:

## 2018-01-07 NOTE — Assessment & Plan Note (Signed)
Improved on lisinopril-HCTZ 20-25 mg. Will continue same for now as he may have continued decrease in BP after his procedure later this week. He will have repeat creatinine and BUN done per surgical center.

## 2018-01-09 MED ORDER — CEFAZOLIN SODIUM-DEXTROSE 2-4 GM/100ML-% IV SOLN: 2.0000 g | Freq: Once | INTRAVENOUS | Status: DC

## 2018-01-10 ENCOUNTER — Other Ambulatory Visit: Payer: Self-pay

## 2018-01-10 ENCOUNTER — Ambulatory Visit
Admission: RE | Admit: 2018-01-10 | Discharge: 2018-01-10 | Disposition: A | Discharge status: Home / Self Care | Payer: Medicare Other | Source: Ambulatory Visit | Attending: Vascular Surgery | Admitting: Vascular Surgery

## 2018-01-10 ENCOUNTER — Encounter
Admission: RE | Disposition: A | Discharge status: Home / Self Care | Payer: Self-pay | Source: Ambulatory Visit | Attending: Vascular Surgery

## 2018-01-10 DIAGNOSIS — I70213 Atherosclerosis of native arteries of extremities with intermittent claudication, bilateral legs: Secondary | ICD-10-CM | POA: Diagnosis not present

## 2018-01-10 DIAGNOSIS — I739 Peripheral vascular disease, unspecified: Secondary | ICD-10-CM

## 2018-01-10 HISTORY — PX: LOWER EXTREMITY ANGIOGRAPHY: CATH118251

## 2018-01-10 LAB — CREATININE, SERUM
CREATININE: 0.75 mg/dL (ref 0.61–1.24)
GFR calc Af Amer: >60 mL/min (ref >60)
GFR calc non Af Amer: >60 mL/min (ref >60)

## 2018-01-10 LAB — BUN: BUN: 17 mg/dL (ref 8–23)

## 2018-01-10 LAB — GLUCOSE, CAPILLARY
GLUCOSE-CAPILLARY: 159 mg/dL — AB (ref 70–99)
GLUCOSE-CAPILLARY: 155 mg/dL — AB (ref 70–99)

## 2018-01-10 SURGERY — LOWER EXTREMITY ANGIOGRAPHY
Anesthesia: Moderate Sedation | Laterality: Left

## 2018-01-10 MED ORDER — ACETAMINOPHEN 325 MG PO TABS: 650.0000 mg | ORAL_TABLET | ORAL | Status: DC | PRN

## 2018-01-10 MED ORDER — FENTANYL CITRATE (PF) 100 MCG/2ML IJ SOLN
INTRAMUSCULAR | Status: DC | PRN
  Administered 2018-01-10 (×3): 12.5 ug via INTRAVENOUS
  Administered 2018-01-10: 50 ug via INTRAVENOUS
  Administered 2018-01-10: 12.5 ug via INTRAVENOUS

## 2018-01-10 MED ORDER — FAMOTIDINE 20 MG PO TABS: 40.0000 mg | ORAL_TABLET | ORAL | Status: DC | PRN

## 2018-01-10 MED ORDER — SODIUM CHLORIDE 0.9 % IV SOLN: 250.0000 mL | INTRAVENOUS | Status: DC | PRN

## 2018-01-10 MED ORDER — HEPARIN (PORCINE) IN NACL 1000-0.9 UT/500ML-% IV SOLN
INTRAVENOUS | Status: AC
  Filled 2018-01-10: qty 1000

## 2018-01-10 MED ORDER — ATORVASTATIN CALCIUM 10 MG PO TABS: 10.0000 mg | ORAL_TABLET | Freq: Every day | ORAL | Status: DC

## 2018-01-10 MED ORDER — IOPAMIDOL (ISOVUE-300) INJECTION 61%
INTRAVENOUS | Status: DC | PRN
  Administered 2018-01-10: 110 mL via INTRA_ARTERIAL

## 2018-01-10 MED ORDER — SODIUM CHLORIDE 0.9 % IV SOLN: INTRAVENOUS | Status: DC

## 2018-01-10 MED ORDER — ONDANSETRON HCL 4 MG/2ML IJ SOLN: 4.0000 mg | Freq: Four times a day (QID) | INTRAMUSCULAR | Status: DC | PRN

## 2018-01-10 MED ORDER — HEPARIN SODIUM (PORCINE) 1000 UNIT/ML IJ SOLN
INTRAMUSCULAR | Status: AC
  Filled 2018-01-10: qty 1

## 2018-01-10 MED ORDER — CEFAZOLIN SODIUM-DEXTROSE 2-4 GM/100ML-% IV SOLN: 2.0000 g | Freq: Once | INTRAVENOUS | Status: DC

## 2018-01-10 MED ORDER — MIDAZOLAM HCL 2 MG/2ML IJ SOLN
INTRAMUSCULAR | Status: DC | PRN
  Administered 2018-01-10 (×3): 0.5 mg via INTRAVENOUS
  Administered 2018-01-10: 1 mg via INTRAVENOUS
  Administered 2018-01-10: 2 mg via INTRAVENOUS
  Administered 2018-01-10: 0.5 mg via INTRAVENOUS

## 2018-01-10 MED ORDER — METHYLPREDNISOLONE SODIUM SUCC 125 MG IJ SOLR: 125.0000 mg | INTRAMUSCULAR | Status: DC | PRN

## 2018-01-10 MED ORDER — LIDOCAINE-EPINEPHRINE (PF) 1 %-1:200000 IJ SOLN
INTRAMUSCULAR | Status: AC
  Filled 2018-01-10: qty 10

## 2018-01-10 MED ORDER — SODIUM CHLORIDE 0.9% FLUSH: 3.0000 mL | Freq: Two times a day (BID) | INTRAVENOUS | Status: DC

## 2018-01-10 MED ORDER — HYDRALAZINE HCL 20 MG/ML IJ SOLN
5.0000 mg | INTRAMUSCULAR | Status: DC | PRN
  Administered 2018-01-10: 5 mg via INTRAVENOUS

## 2018-01-10 MED ORDER — HYDRALAZINE HCL 20 MG/ML IJ SOLN
INTRAMUSCULAR | Status: AC
  Filled 2018-01-10: qty 1

## 2018-01-10 MED ORDER — SODIUM CHLORIDE 0.9% FLUSH: 3.0000 mL | INTRAVENOUS | Status: DC | PRN

## 2018-01-10 MED ORDER — HYDROMORPHONE HCL 1 MG/ML IJ SOLN: 1.0000 mg | Freq: Once | INTRAMUSCULAR | Status: DC | PRN

## 2018-01-10 MED ORDER — HEPARIN SODIUM (PORCINE) 1000 UNIT/ML IJ SOLN
INTRAMUSCULAR | Status: DC | PRN
  Administered 2018-01-10: 5000 [IU] via INTRAVENOUS

## 2018-01-10 MED ORDER — LABETALOL HCL 5 MG/ML IV SOLN: 10.0000 mg | INTRAVENOUS | Status: DC | PRN

## 2018-01-10 MED ORDER — CEFAZOLIN SODIUM-DEXTROSE 2-4 GM/100ML-% IV SOLN
2.0000 g | Freq: Once | INTRAVENOUS | Status: AC
  Administered 2018-01-10: 2 g via INTRAVENOUS

## 2018-01-10 MED ORDER — MIDAZOLAM HCL 5 MG/5ML IJ SOLN
INTRAMUSCULAR | Status: AC
  Filled 2018-01-10: qty 10

## 2018-01-10 MED ORDER — FENTANYL CITRATE (PF) 100 MCG/2ML IJ SOLN
INTRAMUSCULAR | Status: AC
  Filled 2018-01-10: qty 4

## 2018-01-10 SURGICAL SUPPLY — 39 items
BALLN LUTONIX 018 5X150X130 (BALLOONS) ×3
BALLN LUTONIX 018 5X220X130 (BALLOONS) ×6
BALLN LUTONIX DCB 5X60X130 (BALLOONS) ×3
BALLN STERLING OTW 4X40X135 (BALLOONS) ×3
BALLN STERLING OTW 4X60X135 (BALLOONS) ×3
BALLN ULTRV 018 4X40X75 (BALLOONS) ×3
BALLN ULTRVRSE 2.5X300X150 (BALLOONS) ×3
BALLN ULTRVRSE 3X100X150 (BALLOONS) ×3
BALLN ULTRVRSE 4X40X150 (BALLOONS) ×3
BALLN VIATRAC 4X30X135 (BALLOONS) ×3
BALLOON LUTONIX 018 5X150X130 (BALLOONS) IMPLANT
BALLOON LUTONIX 018 5X220X130 (BALLOONS) IMPLANT
BALLOON LUTONIX DCB 5X60X130 (BALLOONS) IMPLANT
BALLOON STERLING OTW 4X40X135 (BALLOONS) IMPLANT
BALLOON STERLING OTW 4X60X135 (BALLOONS) IMPLANT
BALLOON ULTRV 018 4X40X75 (BALLOONS) IMPLANT
BALLOON ULTRVRSE 2.5X300X150 (BALLOONS) IMPLANT
BALLOON ULTRVRSE 3X100X150 (BALLOONS) IMPLANT
BALLOON ULTRVRSE 4X40X150 (BALLOONS) IMPLANT
BALLOON VIATRAC 4X30X135 (BALLOONS) IMPLANT
CANNULA 5F STIFF (CANNULA) ×2 IMPLANT
CATH BEACON 5 .038 100 VERT TP (CATHETERS) ×2 IMPLANT
CATH CXI 4F 90 DAV (CATHETERS) ×2 IMPLANT
CATH CXI SUPP ANG 4FR 135 (CATHETERS) IMPLANT
CATH CXI SUPP ANG 4FR 135CM (CATHETERS) ×3
CATH PIG 70CM (CATHETERS) ×2 IMPLANT
DEVICE PRESTO INFLATION (MISCELLANEOUS) ×4 IMPLANT
DEVICE SAFEGUARD 24CM (GAUZE/BANDAGES/DRESSINGS) ×2 IMPLANT
DEVICE STARCLOSE SE CLOSURE (Vascular Products) ×2 IMPLANT
GLIDEWIRE ADV .035X260CM (WIRE) ×2 IMPLANT
GUIDEWIRE STR TIP .014X300X8 (WIRE) ×4 IMPLANT
PACK ANGIOGRAPHY (CUSTOM PROCEDURE TRAY) ×3 IMPLANT
SHEATH ANL2 6FRX45 HC (SHEATH) ×2 IMPLANT
SHEATH BRITE TIP 5FRX11 (SHEATH) ×2 IMPLANT
STENT VIABAHN 6X100X120 (Permanent Stent) ×2 IMPLANT
STENT VIABAHN 6X250X120 (Permanent Stent) ×2 IMPLANT
TUBING CONTRAST HIGH PRESS 72 (TUBING) ×2 IMPLANT
WIRE G V18X300CM (WIRE) ×4 IMPLANT
WIRE J 3MM .035X145CM (WIRE) ×2 IMPLANT

## 2018-01-10 NOTE — H&P (Signed)
[facility] History & Physical Update  The patient was interviewed and re-examined.  The patient's previous History and Physical has been reviewed and is unchanged.  There is no change in the plan of care. We plan to proceed with the scheduled procedure.  Leotis Pain, MD  01/10/2018, 8:09 AM

## 2018-01-10 NOTE — Op Note (Signed)
[facility]  Percutaneous Study/Intervention Procedural Note   Date of Surgery: 01/10/2018  Surgeon(s):Elnora Quizon    Assistants:none  Pre-operative Diagnosis: PAD with claudication bilateral lower extremities  Post-operative diagnosis:  Same  Procedure(s) Performed:             1.  Ultrasound guidance for vascular access right femoral artery             2.  Catheter placement into left SFA and profunda femorus arteries from right femoral approach             3.  Aortogram and selective left lower extremity angiogram             4.  Percutaneous transluminal angioplasty of left proximal posterior tibial artery with 3 mm diameter by 8 cm length angioplasty balloon             5.   Percutaneous transluminal angioplasty of the entire left SFA and above-knee popliteal artery with two 5 mm diameter Lutonix drug-coated angioplasty balloons  6.  Viabahn stent placement x2 to the left SFA and proximal popliteal artery with a 6 mm diameter by 25 cm length stent and a 6 mm diameter by 10 cm length stent  7.  Percutaneous transluminal angioplasty of the left proximal profunda femoris artery with 4 mm diameter and 5 mm diameter Lutonix drug-coated angioplasty balloon including simultaneous 4 mm balloons in the proximal SFA and proximal profunda femoris artery in a kissing balloon fashion             8.  StarClose closure device right femoral artery  EBL: 10 cc  Contrast: 110 cc  Fluoro Time: 21.3 minutes  Moderate Conscious Sedation Time: approximately 90 minutes using 5 mg of Versed and 100 mcg of Fentanyl              Indications:  Patient is a 72 y.o.male with disabling claudication symptoms bilaterally. The patient has noninvasive study showing markedly reduced perfusion bilaterally.  He is already undergone extensive right lower extremity revascularization. The patient is brought in for angiography for further evaluation and potential treatment the left leg. Risks and  benefits are discussed and informed consent is obtained.   Procedure:  The patient was identified and appropriate procedural time out was performed.  The patient was then placed supine on the table and prepped and draped in the usual sterile fashion. Moderate conscious sedation was administered during a face to face encounter with the patient throughout the procedure with my supervision of the RN administering medicines and monitoring the patient's vital signs, pulse oximetry, telemetry and mental status throughout from the start of the procedure until the patient was taken to the recovery room. Ultrasound was used to evaluate the right common femoral artery.  It was patent distal above the previously treated area in the common femoral artery which was in the mid right common femoral artery.  A digital ultrasound image was acquired.  A Seldinger needle was used to access the right common femoral artery under direct ultrasound guidance and a permanent image was performed.  A 0.035 J wire was advanced without resistance and a 5Fr sheath was placed.  Pigtail catheter was placed into the aorta and an AP aortogram was performed. This demonstrated normal renal arteries and normal aorta and iliac segments without significant stenosis. I then crossed the aortic bifurcation and advanced to the left femoral head. Selective left lower extremity angiogram was then performed. This demonstrated a flush occlusion  of the left SFA and about a 90% stenosis of the proximal profunda femoris artery.  The distal SFA reconstituted and there was two-vessel runoff distally.  There was about a 60 to 70% stenosis of the proximal posterior tibial artery which was the dominant runoff to the foot.  A small peroneal artery provided additional runoff distally. It was felt that it was in the patient's best interest to proceed with intervention after these images to avoid a second procedure and a larger amount of contrast and fluoroscopy based  off of the findings from the initial angiogram. The patient was systemically heparinized and a 6 [place] Ansell sheath was then placed over the [employer] wire. I then used a Kumpe catheter and the advantage wire to navigate into the SFA occlusion.  I then exchanged for a CXI catheter and cross the occlusion without difficulty confirming intraluminal flow in the below-knee popliteal artery.  This opacify the tibial vessels better in the proximal stenosis of the posterior tibial artery was treated as this was the dominant runoff distally.  I advanced the CXI catheter and the advantage wire across the lesion and then exchanged for a 0.018 wire.  A 3 mm diameter by 8 cm length angioplasty balloon was inflated to 10 atm for 1 minute.  Completion imaging showed only about a 10% residual stenosis.  I then used a 5 mm diameter by 22 cm length Lutonix drug-coated angioplasty balloon inflated to 12 atm for 1 minute from the above-knee popliteal artery up to the mid SFA.  A 5 mm diameter by 15 cm length Lutonix drug-coated angioplasty balloon was used in the common femoral artery and proximal SFA.  This was taken to 10 atm for 1 minute.  Completion imaging showed diffuse residual disease throughout much of the SFA creating greater than 50% stenosis in multiple areas.  I elected to treat these areas with stents.  A 6 mm diameter by 25 cm length Viabahn stent was deployed from the proximal popliteal artery up to the proximal to mid SFA.  A 6 mm diameter by 10 cm length Viabahn stent was then deployed starting about 1 to 2 cm from the origin of the SFA down into the previously placed stent.  These were postdilated with 5 mm balloons with excellent angiographic completion result and less than 20% residual stenosis.  I then turned my attention to the profunda femoris artery.  I crossed the lesion with the advantage wire and used a 5 mm diameter by 6 cm length Lutonix drug-coated angioplasty balloon inflated to 8 atm for 1  minute and the proximal left profunda femoris artery.  Completion imaging showed only about a 10% residual stenosis, but after this there appeared to be reasonably high-grade stenosis back in the proximal SFA.  With this, I elected to perform kissing balloon therapy to the proximal SFA and proximal profunda femoris artery simultaneously.  These areas had both already been treated with Lutonix drug-coated balloons.  I exchanged for 0.014 wires in both locations and used 4 mm balloons in the proximal SFA and proximal profunda femoris artery simultaneously.  These were inflated to 8 atm.  Getting access to both vessels and exchanged for the wires was quite tedious requiring significant fluoroscopy time.  Completion imaging showed mild residual stenosis in both locations in the 30 to 35% range but this was a marked improvement and I felt much better about his runoff at this point.  If anything further needs to be done, this would likely  require a femoral endarterectomy.  He still had residual common femoral disease on the right as well, and a bilateral femoral endarterectomy may have to be considered in the future.  I elected to terminate the procedure. The sheath was removed and StarClose closure device was deployed in the right femoral artery with excellent hemostatic result. The patient was taken to the recovery room in stable condition having tolerated the procedure well.  Findings:               Aortogram:  Patent renal arteries without significant stenosis bilaterally.  The aorta and iliac arteries had no hemodynamically significant stenosis and the area in the right external iliac artery that was treated previously had minimal residual stenosis.             Left lower Extremity:  Flush occlusion of the left SFA and about a 90% stenosis of the proximal profunda femoris artery.  The distal SFA reconstituted and there was two-vessel runoff distally.  There was about a 60 to 70% stenosis of the proximal posterior  tibial artery which was the dominant runoff to the foot.  A small peroneal artery provided additional runoff distally.   Disposition: Patient was taken to the recovery room in stable condition having tolerated the procedure well.  Complications: None  Leotis Pain 01/10/2018 10:16 AM   This note was created with Dragon Medical transcription system. Any errors in dictation are purely unintentional.

## 2018-01-14 ENCOUNTER — Encounter: Payer: Self-pay | Admitting: *Deleted

## 2018-01-24 ENCOUNTER — Ambulatory Visit: Payer: Medicare Other | Admitting: Podiatry

## 2018-02-01 ENCOUNTER — Other Ambulatory Visit (INDEPENDENT_AMBULATORY_CARE_PROVIDER_SITE_OTHER): Payer: Self-pay | Admitting: Vascular Surgery

## 2018-02-01 DIAGNOSIS — Z95820 Peripheral vascular angioplasty status with implants and grafts: Secondary | ICD-10-CM

## 2018-02-01 DIAGNOSIS — I70213 Atherosclerosis of native arteries of extremities with intermittent claudication, bilateral legs: Secondary | ICD-10-CM

## 2018-02-08 ENCOUNTER — Ambulatory Visit (INDEPENDENT_AMBULATORY_CARE_PROVIDER_SITE_OTHER): Payer: Medicare Other

## 2018-02-08 ENCOUNTER — Ambulatory Visit (INDEPENDENT_AMBULATORY_CARE_PROVIDER_SITE_OTHER): Payer: Medicare Other | Admitting: Nurse Practitioner

## 2018-02-08 ENCOUNTER — Encounter (INDEPENDENT_AMBULATORY_CARE_PROVIDER_SITE_OTHER): Payer: Medicare Other

## 2018-02-08 ENCOUNTER — Encounter (INDEPENDENT_AMBULATORY_CARE_PROVIDER_SITE_OTHER): Payer: Self-pay | Admitting: Nurse Practitioner

## 2018-02-08 VITALS — BP 142/74 | HR 55 | Resp 16 | Ht 66.0 in | Wt 167.8 lb

## 2018-02-08 DIAGNOSIS — Z9862 Peripheral vascular angioplasty status: Secondary | ICD-10-CM

## 2018-02-08 DIAGNOSIS — K2100 Gastro-esophageal reflux disease with esophagitis, without bleeding: Secondary | ICD-10-CM

## 2018-02-08 DIAGNOSIS — I739 Peripheral vascular disease, unspecified: Secondary | ICD-10-CM

## 2018-02-08 DIAGNOSIS — I10 Essential (primary) hypertension: Secondary | ICD-10-CM | POA: Diagnosis not present

## 2018-02-08 DIAGNOSIS — I70213 Atherosclerosis of native arteries of extremities with intermittent claudication, bilateral legs: Secondary | ICD-10-CM | POA: Diagnosis not present

## 2018-02-08 DIAGNOSIS — F17210 Nicotine dependence, cigarettes, uncomplicated: Secondary | ICD-10-CM | POA: Diagnosis not present

## 2018-02-08 DIAGNOSIS — Z95820 Peripheral vascular angioplasty status with implants and grafts: Secondary | ICD-10-CM | POA: Diagnosis not present

## 2018-02-13 ENCOUNTER — Encounter (INDEPENDENT_AMBULATORY_CARE_PROVIDER_SITE_OTHER): Payer: Self-pay | Admitting: Nurse Practitioner

## 2018-02-13 NOTE — Progress Notes (Signed)
Subjective:    Patient ID: Antonio [name], male    DOB: 1945-07-09, 73 y.o.   MRN: [id number] Chief Complaint  Patient presents with  . Follow-up    HPI  Antonio [name] is a 73 y.o. male that is following up today after angioplasty of the right and left lower extremity.  Previously before his intervention the patient was having consistent rest pain and lifestyle limiting claudication-like symptoms.  Since his procedures, the patient still has some claudication however the pain is not nearly as much as it was.  He is able to go more about the daily activities of his life with occasional rest breaks.  He denies any lower extremity wounds or ulcerations.  Patient continues to use tobacco products.  The patient underwent angioplasty of his right lower extremity on 12/27/2017 and his left lower extremity on 01/10/2018.  The patient denies any chest pain or shortness of breath.  He denies any fever, chills, nausea, vomiting or diarrhea.  The patient underwent bilateral ABIs today which revealed an ABI of 0.80 on the right And 0.74 on the left.  C TBI on the right lower extremity is 0.58 and 0.37 on the left.  His previous ABIs done on 12/12/2017 for 0.46 on the right lower extremity with a TBI of 0.29.  His left lower extremity had an ABI 0.61 with a TBI 0.28.  He currently has biphasic waveforms of his right tibial arteries with monophasic waveforms left tibial waveforms. Past Medical History:  Diagnosis Date  . 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 Coronary atherosclerosis of unspecified type of vessel, native or graft   . Diabetes mellitus    Type II  . Esophageal stricture   . Gastritis   . Hiatal hernia   . Hx of colonic polyps   . Hyperlipidemia   . Hypertension   . Internal hemorrhoids without mention of complication   . Lumbago   . Pain in both lower legs   . PSA elevation    now averaging 2's  . Tobacco abuse   . URI (upper respiratory infection)   .  Urinary retention     Past Surgical History:  Procedure Laterality Date  . 2D Echo  06/03  . Abdominal ultrasound  06/03   Negative  . BACK SURGERY    . CT of head  and sinuses  06/03   Negative  . CT of the chest, abdomen, pelvis  06/03   Chest negative; Abdomen, left adrenal lesion; Pelvis, enlarged prostate  . ESOPHAGOGASTRODUODENOSCOPY  02/13/07   gastritis and duodenitis without bleed  . ESOPHAGOGASTRODUODENOSCOPY N/A 06/26/2014   Procedure: ESOPHAGOGASTRODUODENOSCOPY (EGD);  Surgeon: Milus Banister, MD;  Location: [place];  Service: Endoscopy;  Laterality: N/A;  . KNEE ARTHROSCOPY  10/00   Right  . LOWER EXTREMITY ANGIOGRAPHY Right 12/27/2017   Procedure: LOWER EXTREMITY ANGIOGRAPHY;  Surgeon: Algernon Huxley, MD;  Location: [facility];  Service: Cardiovascular;  Laterality: Right;  . LOWER EXTREMITY ANGIOGRAPHY Left 01/10/2018   Procedure: LOWER EXTREMITY ANGIOGRAPHY;  Surgeon: Algernon Huxley, MD;  Location: [facility];  Service: Cardiovascular;  Laterality: Left;  . LP  06/03  . Microwave thermotherapy prostate  08/28/07   [facility] Blocker  . Persantine cardiolite  03/01   EF 68%  . POLYPECTOMY  11/95  . Stress myoview  07/06   EF 67%    Social History   Socioeconomic History  . Marital  status: Married    Spouse name: [name]  . Number of children: 3  . Years of education: Not on file  . Highest education level: Not on file  Occupational History  . Occupation: Retired Animal nutritionist  . Financial resource strain: Not hard at all  . Food insecurity:    Worry: Never true    Inability: Never true  . Transportation needs:    Medical: No    Non-medical: No  Tobacco Use  . Smoking status: Current Every Day Smoker    Packs/day: 1.50    Years: 40.00    Pack years: 60.00    Types: Cigarettes  . Smokeless tobacco: Former Systems developer    Types: Chew  Substance and Sexual Activity  . Alcohol use: Yes    Alcohol/week: 0.0 standard drinks    Comment: rare    . Drug use: No  . Sexual activity: Not on file  Lifestyle  . Physical activity:    Days per week: 0 days    Minutes per session: Not on file  . Stress: To some extent  Relationships  . Social connections:    Talks on phone: More than three times a week    Gets together: Three times a week    Attends religious service: Never    Active member of club or organization: Not on file    Attends meetings of clubs or organizations: Not on file    Relationship status: Married  . Intimate partner violence:    Fear of current or ex partner: No    Emotionally abused: No    Physically abused: No    Forced sexual activity: No  Other Topics Concern  . Not on file  Social History Narrative   Married 9/09 wife with moderate memory problems.   3 adult children, 2 grandchildren   Would desire CPR    Family History  Problem Relation Age of Onset  . Diabetes Father   . Alcohol abuse Paternal Uncle   . Alcohol abuse Paternal Uncle   . Alcohol abuse Paternal Uncle   . Rheum arthritis Mother   . Heart disease Neg Hx   . Stroke Neg Hx   . Cancer Neg Hx   . Drug abuse Neg Hx   . Depression Neg Hx   . Colon cancer Neg Hx   . Rectal cancer Neg Hx   . Stomach cancer Neg Hx   . Kidney cancer Neg Hx   . Bladder Cancer Neg Hx   . Prostate cancer Neg Hx     No Known Allergies   Review of Systems   Review of Systems: Negative Unless Checked Constitutional: [] Weight loss  [] Fever  [] Chills Cardiac: [] Chest pain   []  Atrial Fibrillation  [] Palpitations   [] Shortness of breath when laying flat   [] Shortness of breath with exertion. [] Shortness of breath at rest Vascular:  [x] Pain in legs with walking   [] Pain in legs with standing [] Pain in legs when laying flat   [x] Claudication    [] Pain in feet when laying flat    [] History of DVT   [] Phlebitis   [] Swelling in legs   [] Varicose veins   [] Non-healing ulcers Pulmonary:   [] Uses home oxygen   [] Productive cough   [] Hemoptysis   [] Wheeze  [] COPD    [] Asthma Neurologic:  [] Dizziness   [] Seizures  [] Blackouts [] History of stroke   [] History of TIA  [] Aphasia   [] Temporary Blindness   [] Weakness or numbness in arm   []   Weakness or numbness in leg Musculoskeletal:   [x] Joint swelling   [] Joint pain   [] Low back pain  []  History of Knee Replacement [x] Arthritis [] back Surgeries  []  Spinal Stenosis    Hematologic:  [] Easy bruising  [] Easy bleeding   [] Hypercoagulable state   [] Anemic Gastrointestinal:  [] Diarrhea   [] Vomiting  [x] Gastroesophageal reflux/heartburn   [] Difficulty swallowing. [] Abdominal pain Genitourinary:  [] Chronic kidney disease   [] Difficult urination  [] Anuric   [] Blood in urine [] Frequent urination  [] Burning with urination   [] Hematuria Skin:  [] Rashes   [] Ulcers [] Wounds Psychological:  [] History of anxiety   []  History of major depression  []  Memory Difficulties     Objective:   Physical Exam  BP (!) 142/74 (BP Location: Right Arm, Patient Position: Sitting)   Pulse (!) 55   Resp 16   Ht 5\' 6"  (1.676 m)   Wt 167 lb 12.8 oz (76.1 kg)   BMI 27.08 kg/m   Gen: WD/WN, NAD Head: [place]/AT, No temporalis wasting.  Ear/Nose/Throat: Hearing grossly intact, nares w/o erythema or drainage Eyes: PER, EOMI, sclera nonicteric.  Neck: Supple, no masses.  No JVD.  Pulmonary:  Good air movement, no use of accessory muscles.  Cardiac: RRR Vascular:  Vessel Right Left  Radial Palpable Palpable  Dorsalis Pedis Palpable Not Palpable  Posterior Tibial Palpable Not Palpable   Gastrointestinal: soft, non-distended. No guarding/no peritoneal signs.  Musculoskeletal: M/S 5/5 throughout.  No deformity or atrophy.  Neurologic: Pain and light touch intact in extremities.  Symmetrical.  Speech is fluent. Motor exam as listed above. Psychiatric: Judgment intact, Mood & affect appropriate for pt's clinical situation. Dermatologic: No Venous rashes. No Ulcers Noted.  No changes consistent with cellulitis. Lymph : No Cervical  lymphadenopathy, no lichenification or skin changes of chronic lymphedema.      Assessment & Plan:   1. PAD (peripheral artery disease) ([place]) The patient underwent bilateral ABIs today which revealed an ABI of 0.80 on the right And 0.74 on the left.  The TBI on the right lower extremity is 0.58 and 0.37 on the left.  His previous ABIs done on 12/12/2017 for 0.46 on the right lower extremity with a TBI of 0.29.  His left lower extremity had an ABI 0.61 with a TBI 0.28.  He currently has biphasic waveforms of his right tibial arteries with monophasic waveforms left tibial waveforms.   Due to the patient's decreased ABIs and TBI's on his left lower extremity, I suggested that we re-intervene on his left lower extremity.  Patient however declined due to having less pain than previous.  I discussed with the patient how smoking decreases the longevity of interventions and strongly encouraged him to stop smoking.  I advised the patient should he develop any new severe pains, wounds or ulcerations, temperature cultures of the lower extremity he should contact our office to be seen sooner, otherwise we will have him follow-up in 3 months with noninvasive studies. - VAS [place] ABI WITH/WO TBI; Future - VAS [place] LOWER EXTREMITY ARTERIAL DUPLEX; Future  2. Essential hypertension Continue antihypertensive medications as already ordered, these medications have been reviewed and there are no changes at this time.   3. Gastroesophageal reflux disease with esophagitis Continue PPI as already ordered, this medication has been reviewed and there are no changes at this time.  Avoidence of caffeine and alcohol  Moderate elevation of the head of the bed    Current Outpatient Medications on File Prior to Visit  Medication Sig Dispense Refill  .  aspirin 81 MG tablet Take 81 mg by mouth daily.    [name] atorvastatin (LIPITOR) 20 MG tablet Take 1 tablet by mouth every evening for cholesterol. 90 tablet 3  . clopidogrel  (PLAVIX) 75 MG tablet Take 1 tablet (75 mg total) by mouth daily. 30 tablet 11  . finasteride (PROSCAR) 5 MG tablet Take 1 tablet (5 mg total) by mouth daily. 90 tablet 3  . glipiZIDE (GLUCOTROL) 10 MG tablet Take 10 mg by mouth daily before supper.     [name] glucose blood ([employer] TEST STRIPS) test strip Use one strip three times daily.    . insulin detemir (LEVEMIR) 100 UNIT/ML injection Inject 20 units at bedtime. (Patient taking differently: every morning. Inject 20 units at breakfast) 10 pen 5  . lisinopril-hydrochlorothiazide (PRINZIDE,ZESTORETIC) 20-25 MG tablet Take 1 tablet by mouth daily. For blood pressure. 90 tablet 0  . metFORMIN (GLUCOPHAGE) 500 MG tablet TAKE TWO TABLETS BY MOUTH TWICE DAILY WITH A MEAL for diabetes. 360 tablet 3  . metoprolol tartrate (LOPRESSOR) 50 MG tablet TAKE 1/2 (ONE-HALF) TABLET BY MOUTH TWICE DAILY 45 tablet 3  . pantoprazole (PROTONIX) 40 MG tablet Take 1 tablet (40 mg total) by mouth daily. 90 tablet 3  . tamsulosin (FLOMAX) 0.4 MG CAPS capsule Take two capsule daily 180 capsule 4   No current facility-administered medications on file prior to visit.     There are no Patient Instructions on file for this visit. No follow-ups on file.   Kris Hartmann, NP  This note was completed with Sales executive.  Any errors are purely unintentional.

## 2018-02-14 ENCOUNTER — Ambulatory Visit (INDEPENDENT_AMBULATORY_CARE_PROVIDER_SITE_OTHER): Payer: Medicare Other | Admitting: Podiatry

## 2018-02-14 ENCOUNTER — Encounter: Payer: Self-pay | Admitting: Podiatry

## 2018-02-14 DIAGNOSIS — M79676 Pain in unspecified toe(s): Secondary | ICD-10-CM | POA: Diagnosis not present

## 2018-02-14 DIAGNOSIS — B351 Tinea unguium: Secondary | ICD-10-CM | POA: Diagnosis not present

## 2018-02-14 DIAGNOSIS — E1142 Type 2 diabetes mellitus with diabetic polyneuropathy: Secondary | ICD-10-CM

## 2018-02-14 DIAGNOSIS — M79609 Pain in unspecified limb: Principal | ICD-10-CM

## 2018-02-14 NOTE — Progress Notes (Signed)
Complaint:  Visit Type: Patient returns to my office for continued preventative foot care services. Complaint: Patient states" my nails have grown long and thick and become painful to walk and wear shoes" Patient has been diagnosed with DM with neuropathy. The patient presents for preventative foot care services. No changes to ROS  Podiatric Exam: Vascular: dorsalis pedis and posterior tibial pulses are barely palpable bilateral. Capillary return is immediate. Temperature gradient is WNL. Skin turgor WNL  Sensorium: Diminished Semmes Weinstein monofilament test. Normal tactile sensation bilaterally. Nail Exam: Pt has thick disfigured discolored nails with subungual debris noted bilateral entire nail hallux through fifth toenails Ulcer Exam: There is no evidence of ulcer or pre-ulcerative changes or infection. Orthopedic Exam: Muscle tone and strength are WNL. No limitations in general ROM. No crepitus or effusions noted. Foot type and digits show no abnormalities. DJD Midfoot  B/L Skin: No Porokeratosis. No infection or ulcers  Diagnosis:  Onychomycosis, , Pain in right toe, pain in left toes  Treatment & Plan Procedures and Treatment: Consent by patient was obtained for treatment procedures. The patient understood the discussion of treatment and procedures well. All questions were answered thoroughly reviewed. Debridement of mycotic and hypertrophic toenails, 1 through 5 bilateral and clearing of subungual debris. No ulceration, no infection noted. ABN signed for 2020. Return Visit-Office Procedure: Patient instructed to return to the office for a follow up visit 3 months for continued evaluation and treatment.    Gardiner Barefoot DPM

## 2018-02-22 ENCOUNTER — Encounter (INDEPENDENT_AMBULATORY_CARE_PROVIDER_SITE_OTHER): Payer: Medicare Other

## 2018-02-22 ENCOUNTER — Ambulatory Visit (INDEPENDENT_AMBULATORY_CARE_PROVIDER_SITE_OTHER): Payer: Medicare Other | Admitting: Vascular Surgery

## 2018-03-22 ENCOUNTER — Other Ambulatory Visit: Payer: Self-pay | Admitting: Primary Care

## 2018-03-22 DIAGNOSIS — I10 Essential (primary) hypertension: Secondary | ICD-10-CM

## 2018-03-27 DIAGNOSIS — Z794 Long term (current) use of insulin: Secondary | ICD-10-CM | POA: Diagnosis not present

## 2018-03-27 DIAGNOSIS — E1142 Type 2 diabetes mellitus with diabetic polyneuropathy: Secondary | ICD-10-CM | POA: Diagnosis not present

## 2018-03-27 DIAGNOSIS — E1129 Type 2 diabetes mellitus with other diabetic kidney complication: Secondary | ICD-10-CM | POA: Diagnosis not present

## 2018-03-27 DIAGNOSIS — R202 Paresthesia of skin: Secondary | ICD-10-CM | POA: Diagnosis not present

## 2018-03-27 DIAGNOSIS — R809 Proteinuria, unspecified: Secondary | ICD-10-CM | POA: Diagnosis not present

## 2018-04-03 DIAGNOSIS — E538 Deficiency of other specified B group vitamins: Secondary | ICD-10-CM | POA: Diagnosis not present

## 2018-04-03 DIAGNOSIS — E1142 Type 2 diabetes mellitus with diabetic polyneuropathy: Secondary | ICD-10-CM | POA: Diagnosis not present

## 2018-04-03 DIAGNOSIS — E1129 Type 2 diabetes mellitus with other diabetic kidney complication: Secondary | ICD-10-CM | POA: Diagnosis not present

## 2018-04-03 DIAGNOSIS — F17200 Nicotine dependence, unspecified, uncomplicated: Secondary | ICD-10-CM | POA: Diagnosis not present

## 2018-04-03 DIAGNOSIS — E1159 Type 2 diabetes mellitus with other circulatory complications: Secondary | ICD-10-CM | POA: Diagnosis not present

## 2018-04-03 DIAGNOSIS — Z794 Long term (current) use of insulin: Secondary | ICD-10-CM | POA: Diagnosis not present

## 2018-04-03 DIAGNOSIS — I10 Essential (primary) hypertension: Secondary | ICD-10-CM | POA: Diagnosis not present

## 2018-04-03 DIAGNOSIS — R809 Proteinuria, unspecified: Secondary | ICD-10-CM | POA: Diagnosis not present

## 2018-05-14 ENCOUNTER — Encounter (INDEPENDENT_AMBULATORY_CARE_PROVIDER_SITE_OTHER): Payer: Self-pay

## 2018-05-14 ENCOUNTER — Ambulatory Visit (INDEPENDENT_AMBULATORY_CARE_PROVIDER_SITE_OTHER): Payer: Medicare Other

## 2018-05-14 ENCOUNTER — Ambulatory Visit (INDEPENDENT_AMBULATORY_CARE_PROVIDER_SITE_OTHER): Payer: Medicare Other | Admitting: Nurse Practitioner

## 2018-05-14 ENCOUNTER — Other Ambulatory Visit: Payer: Self-pay

## 2018-05-14 DIAGNOSIS — I739 Peripheral vascular disease, unspecified: Secondary | ICD-10-CM | POA: Diagnosis not present

## 2018-05-16 ENCOUNTER — Encounter: Payer: Self-pay | Admitting: Podiatry

## 2018-05-16 ENCOUNTER — Other Ambulatory Visit: Payer: Self-pay

## 2018-05-16 ENCOUNTER — Ambulatory Visit: Payer: Medicare Other | Admitting: Podiatry

## 2018-05-16 ENCOUNTER — Ambulatory Visit (INDEPENDENT_AMBULATORY_CARE_PROVIDER_SITE_OTHER): Payer: Medicare Other | Admitting: Podiatry

## 2018-05-16 VITALS — Temp 97.9°F

## 2018-05-16 DIAGNOSIS — E1142 Type 2 diabetes mellitus with diabetic polyneuropathy: Secondary | ICD-10-CM | POA: Diagnosis not present

## 2018-05-16 DIAGNOSIS — B351 Tinea unguium: Secondary | ICD-10-CM | POA: Diagnosis not present

## 2018-05-16 DIAGNOSIS — M79676 Pain in unspecified toe(s): Secondary | ICD-10-CM

## 2018-05-16 DIAGNOSIS — M79609 Pain in unspecified limb: Principal | ICD-10-CM

## 2018-05-16 NOTE — Progress Notes (Signed)
Complaint:  Visit Type: Patient returns to my office for continued preventative foot care services. Complaint: Patient states" my nails have grown long and thick and become painful to walk and wear shoes" Patient has been diagnosed with DM with neuropathy. The patient presents for preventative foot care services. No changes to ROS.  Patient had vascular surgery in 2019.  Podiatric Exam: Vascular: dorsalis pedis and posterior tibial pulses are barely palpable bilateral. Capillary return is immediate. Temperature gradient is WNL. Skin turgor WNL  Sensorium: Diminished Semmes Weinstein monofilament test. Normal tactile sensation bilaterally. Nail Exam: Pt has thick disfigured discolored nails with subungual debris noted bilateral entire nail hallux through fifth toenails Ulcer Exam: There is no evidence of ulcer or pre-ulcerative changes or infection. Orthopedic Exam: Muscle tone and strength are WNL. No limitations in general ROM. No crepitus or effusions noted. Foot type and digits show no abnormalities. DJD Midfoot  B/L Skin: No Porokeratosis. No infection or ulcers  Diagnosis:  Onychomycosis, , Pain in right toe, pain in left toes  Treatment & Plan Procedures and Treatment: Consent by patient was obtained for treatment procedures. The patient understood the discussion of treatment and procedures well. All questions were answered thoroughly reviewed. Debridement of mycotic and hypertrophic toenails, 1 through 5 bilateral and clearing of subungual debris. No ulceration, no infection noted.  Return Visit-Office Procedure: Patient instructed to return to the office for a follow up visit 3 months for continued evaluation and treatment.    Gardiner Barefoot DPM

## 2018-05-23 ENCOUNTER — Telehealth (INDEPENDENT_AMBULATORY_CARE_PROVIDER_SITE_OTHER): Payer: Self-pay | Admitting: Nurse Practitioner

## 2018-05-23 NOTE — Telephone Encounter (Signed)
Patient previously had at office visit schedule, however his sitter for his wife is leaving any need to leave at that time.  The patient underwent noninvasive studies which revealed that his ABI on his right lower extremity was 0.80 and his left was 0.54.  This was changed compared to the previous study on 02/08/2018.  His previous ABI was 0.80 on his right and 0.74 on his left.  He had monophasic tibial artery waveforms bilaterally.  His right digit waveform was dampened with abnormal left digit waveforms.  Lower extremity arterial duplex revealed that he had an occlusion in the stent placed at his left SFA.  It appears to be occluded in the mid to distal segment.  The patient endorses pain with ambulation as well as some mild rest pain.  He denies any new wounds or ulcerations of his lower extremities.  The patient does continue to smoke on a daily basis.  He denies any fever, chills, nausea, vomiting or diarrhea.  He denies any chest pain or shortness of breath.  Recommend:  The patient has evidence of severe atherosclerotic changes of both lower extremities with rest pain that is associated with preulcerative changes and impending tissue loss of the foot.  This represents a limb threatening ischemia and places the patient at the risk for limb loss.  Patient should undergo angiography of the lower extremities with the hope for intervention for limb salvage.  The risks and benefits as well as the alternative therapies was discussed in detail with the patient.  All questions were answered.  Patient agrees to proceed with angiography.  The patient will follow up with me in the office after the procedure.

## 2018-05-24 ENCOUNTER — Encounter (INDEPENDENT_AMBULATORY_CARE_PROVIDER_SITE_OTHER): Payer: Self-pay

## 2018-05-27 ENCOUNTER — Other Ambulatory Visit (INDEPENDENT_AMBULATORY_CARE_PROVIDER_SITE_OTHER): Payer: Self-pay | Admitting: Nurse Practitioner

## 2018-05-28 MED ORDER — CEFAZOLIN SODIUM-DEXTROSE 2-4 GM/100ML-% IV SOLN
2.0000 g | Freq: Once | INTRAVENOUS | Status: DC
  Administered 2018-05-29: 11:00:00 via INTRAVENOUS
  Administered 2018-05-30: 08:00:00 2 g via INTRAVENOUS

## 2018-05-29 ENCOUNTER — Encounter: Payer: Self-pay | Admitting: *Deleted

## 2018-05-29 ENCOUNTER — Other Ambulatory Visit: Payer: Self-pay

## 2018-05-29 ENCOUNTER — Encounter
Admission: RE | Disposition: A | Discharge status: Home / Self Care | Payer: Self-pay | Source: Home / Self Care | Attending: Vascular Surgery

## 2018-05-29 ENCOUNTER — Inpatient Hospital Stay
Admission: RE | Admit: 2018-05-29 | Discharge: 2018-05-31 | DRG: 271 | Disposition: A | Discharge status: Home / Self Care | Payer: Medicare Other | Attending: Vascular Surgery | Admitting: Vascular Surgery

## 2018-05-29 ENCOUNTER — Other Ambulatory Visit (INDEPENDENT_AMBULATORY_CARE_PROVIDER_SITE_OTHER): Payer: Self-pay | Admitting: Vascular Surgery

## 2018-05-29 DIAGNOSIS — I998 Other disorder of circulatory system: Secondary | ICD-10-CM | POA: Diagnosis not present

## 2018-05-29 DIAGNOSIS — I70211 Atherosclerosis of native arteries of extremities with intermittent claudication, right leg: Secondary | ICD-10-CM

## 2018-05-29 DIAGNOSIS — E785 Hyperlipidemia, unspecified: Secondary | ICD-10-CM | POA: Diagnosis present

## 2018-05-29 DIAGNOSIS — Z833 Family history of diabetes mellitus: Secondary | ICD-10-CM | POA: Diagnosis not present

## 2018-05-29 DIAGNOSIS — I743 Embolism and thrombosis of arteries of the lower extremities: Secondary | ICD-10-CM | POA: Diagnosis present

## 2018-05-29 DIAGNOSIS — Z79899 Other long term (current) drug therapy: Secondary | ICD-10-CM

## 2018-05-29 DIAGNOSIS — I10 Essential (primary) hypertension: Secondary | ICD-10-CM | POA: Diagnosis not present

## 2018-05-29 DIAGNOSIS — N401 Enlarged prostate with lower urinary tract symptoms: Secondary | ICD-10-CM | POA: Diagnosis present

## 2018-05-29 DIAGNOSIS — Z95820 Peripheral vascular angioplasty status with implants and grafts: Secondary | ICD-10-CM | POA: Diagnosis not present

## 2018-05-29 DIAGNOSIS — I70229 Atherosclerosis of native arteries of extremities with rest pain, unspecified extremity: Secondary | ICD-10-CM

## 2018-05-29 DIAGNOSIS — I70202 Unspecified atherosclerosis of native arteries of extremities, left leg: Secondary | ICD-10-CM | POA: Diagnosis present

## 2018-05-29 DIAGNOSIS — I739 Peripheral vascular disease, unspecified: Secondary | ICD-10-CM | POA: Diagnosis not present

## 2018-05-29 DIAGNOSIS — I7092 Chronic total occlusion of artery of the extremities: Secondary | ICD-10-CM | POA: Diagnosis not present

## 2018-05-29 DIAGNOSIS — I70222 Atherosclerosis of native arteries of extremities with rest pain, left leg: Secondary | ICD-10-CM

## 2018-05-29 DIAGNOSIS — N138 Other obstructive and reflux uropathy: Secondary | ICD-10-CM | POA: Diagnosis present

## 2018-05-29 DIAGNOSIS — I2510 Atherosclerotic heart disease of native coronary artery without angina pectoris: Secondary | ICD-10-CM | POA: Diagnosis present

## 2018-05-29 DIAGNOSIS — R339 Retention of urine, unspecified: Secondary | ICD-10-CM | POA: Diagnosis present

## 2018-05-29 DIAGNOSIS — F17210 Nicotine dependence, cigarettes, uncomplicated: Secondary | ICD-10-CM

## 2018-05-29 DIAGNOSIS — E1151 Type 2 diabetes mellitus with diabetic peripheral angiopathy without gangrene: Principal | ICD-10-CM

## 2018-05-29 DIAGNOSIS — Z22322 Carrier or suspected carrier of Methicillin resistant Staphylococcus aureus: Secondary | ICD-10-CM

## 2018-05-29 HISTORY — PX: LOWER EXTREMITY ANGIOGRAPHY: CATH118251

## 2018-05-29 HISTORY — DX: Carrier or suspected carrier of methicillin resistant Staphylococcus aureus: Z22.322

## 2018-05-29 LAB — GLUCOSE, CAPILLARY
Glucose-Capillary: 143 mg/dL — ABNORMAL HIGH (ref 70–99)
Glucose-Capillary: 148 mg/dL — ABNORMAL HIGH (ref 70–99)
Glucose-Capillary: 136 mg/dL — ABNORMAL HIGH (ref 70–99)
Glucose-Capillary: 219 mg/dL — ABNORMAL HIGH (ref 70–99)
Glucose-Capillary: 136 mg/dL — ABNORMAL HIGH (ref 70–99)

## 2018-05-29 LAB — CBC
HCT: 39.6 % (ref 39.0–52.0)
HCT: 40.2 % (ref 39.0–52.0)
HCT: 41.2 % (ref 39.0–52.0)
Hemoglobin: 13.6 g/dL (ref 13.0–17.0)
Hemoglobin: 13.7 g/dL (ref 13.0–17.0)
Hemoglobin: 14 g/dL (ref 13.0–17.0)
MCH: 29.9 pg (ref 26.0–34.0)
MCH: 30 pg (ref 26.0–34.0)
MCH: 30 pg (ref 26.0–34.0)
MCHC: 33.8 g/dL (ref 30.0–36.0)
MCHC: 34 g/dL (ref 30.0–36.0)
MCHC: 34.6 g/dL (ref 30.0–36.0)
MCV: 86.7 fL (ref 80.0–100.0)
MCV: 88.2 fL (ref 80.0–100.0)
MCV: 88.4 fL (ref 80.0–100.0)
Platelets: 231 10*3/uL (ref 150–400)
Platelets: 233 10*3/uL (ref 150–400)
Platelets: 240 10*3/uL (ref 150–400)
RBC: 4.55 MIL/uL (ref 4.22–5.81)
RBC: 4.57 MIL/uL (ref 4.22–5.81)
RBC: 4.67 MIL/uL (ref 4.22–5.81)
RDW: 13.3 % (ref 11.5–15.5)
RDW: 13.4 % (ref 11.5–15.5)
RDW: 13.5 % (ref 11.5–15.5)
WBC: 10.3 10*3/uL (ref 4.0–10.5)
WBC: 12.6 10*3/uL — ABNORMAL HIGH (ref 4.0–10.5)
WBC: 7.8 10*3/uL (ref 4.0–10.5)
nRBC: 0 % (ref 0.0–0.2)
nRBC: 0 % (ref 0.0–0.2)
nRBC: 0 % (ref 0.0–0.2)

## 2018-05-29 LAB — CREATININE, SERUM
Creatinine, Ser: 0.9 mg/dL (ref 0.61–1.24)
GFR calc Af Amer: >60 mL/min (ref >60)
GFR calc non Af Amer: >60 mL/min (ref >60)

## 2018-05-29 LAB — APTT: aPTT: 109 seconds — ABNORMAL HIGH (ref 24–36)

## 2018-05-29 LAB — FIBRINOGEN
Fibrinogen: 369 mg/dL (ref 210–475)
Fibrinogen: 382 mg/dL (ref 210–475)

## 2018-05-29 LAB — BUN: BUN: 22 mg/dL (ref 8–23)

## 2018-05-29 LAB — HEPARIN LEVEL (UNFRACTIONATED)
Heparin Unfractionated: 0.31 IU/mL (ref 0.30–0.70)
Heparin Unfractionated: 0.1 IU/mL — ABNORMAL LOW (ref 0.30–0.70)

## 2018-05-29 LAB — PROTIME-INR
INR: 1.1 (ref 0.8–1.2)
Prothrombin Time: 14 seconds (ref 11.4–15.2)

## 2018-05-29 LAB — MRSA PCR SCREENING: MRSA by PCR: POSITIVE — AB

## 2018-05-29 SURGERY — LOWER EXTREMITY ANGIOGRAPHY
Anesthesia: Moderate Sedation | Laterality: Left

## 2018-05-29 MED ORDER — ATROPINE SULFATE 1 MG/10ML IJ SOSY
PREFILLED_SYRINGE | INTRAMUSCULAR | Status: AC
  Filled 2018-05-29: qty 10

## 2018-05-29 MED ORDER — MORPHINE SULFATE (PF) 4 MG/ML IV SOLN: 2.0000 mg | INTRAVENOUS | Status: DC | PRN

## 2018-05-29 MED ORDER — METFORMIN HCL 500 MG PO TABS
500.0000 mg | ORAL_TABLET | Freq: Two times a day (BID) | ORAL | Status: DC
  Filled 2018-05-29 (×2): qty 1

## 2018-05-29 MED ORDER — HYDROMORPHONE HCL 1 MG/ML IJ SOLN: 1.0000 mg | Freq: Once | INTRAMUSCULAR | Status: DC | PRN

## 2018-05-29 MED ORDER — HEPARIN (PORCINE) 25000 UT/250ML-% IV SOLN
INTRAVENOUS | Status: AC
  Administered 2018-05-29: 800 [IU]/h via INTRAVENOUS
  Filled 2018-05-29: qty 250

## 2018-05-29 MED ORDER — SODIUM CHLORIDE 0.9 % IV SOLN
0.5000 mg/h | INTRAVENOUS | Status: DC
  Administered 2018-05-30: 07:00:00 0.5 mg/h
  Filled 2018-05-29 (×2): qty 10

## 2018-05-29 MED ORDER — HEPARIN SODIUM (PORCINE) 1000 UNIT/ML IJ SOLN
INTRAMUSCULAR | Status: AC
  Filled 2018-05-29: qty 1

## 2018-05-29 MED ORDER — MIDAZOLAM HCL 5 MG/5ML IJ SOLN
INTRAMUSCULAR | Status: AC
  Filled 2018-05-29: qty 5

## 2018-05-29 MED ORDER — HEPARIN (PORCINE) IN NACL 1000-0.9 UT/500ML-% IV SOLN
INTRAVENOUS | Status: AC
  Filled 2018-05-29: qty 1000

## 2018-05-29 MED ORDER — DIPHENHYDRAMINE HCL 50 MG/ML IJ SOLN: 50.0000 mg | Freq: Once | INTRAMUSCULAR | Status: DC | PRN

## 2018-05-29 MED ORDER — PANTOPRAZOLE SODIUM 40 MG PO TBEC
40.0000 mg | DELAYED_RELEASE_TABLET | Freq: Every day | ORAL | Status: DC
  Administered 2018-05-30 – 2018-05-31 (×2): 40 mg via ORAL
  Filled 2018-05-29 (×2): qty 1

## 2018-05-29 MED ORDER — OXYCODONE HCL 5 MG PO TABS: 5.0000 mg | ORAL_TABLET | ORAL | Status: DC | PRN

## 2018-05-29 MED ORDER — NITROGLYCERIN IN D5W 200-5 MCG/ML-% IV SOLN
0.0000 ug/min | INTRAVENOUS | Status: DC
  Administered 2018-05-29: 5 ug/min via INTRAVENOUS
  Filled 2018-05-29: qty 250

## 2018-05-29 MED ORDER — GLIPIZIDE 10 MG PO TABS
10.0000 mg | ORAL_TABLET | Freq: Every day | ORAL | Status: DC
  Filled 2018-05-29 (×4): qty 1

## 2018-05-29 MED ORDER — OXYCODONE HCL 5 MG PO TABS: 10.0000 mg | ORAL_TABLET | ORAL | Status: DC | PRN

## 2018-05-29 MED ORDER — MIDAZOLAM HCL 2 MG/ML PO SYRP: 8.0000 mg | ORAL_SOLUTION | Freq: Once | ORAL | Status: DC | PRN

## 2018-05-29 MED ORDER — INSULIN DETEMIR 100 UNIT/ML ~~LOC~~ SOLN
20.0000 [IU] | Freq: Every day | SUBCUTANEOUS | Status: DC
  Administered 2018-05-31: 20 [IU] via SUBCUTANEOUS
  Filled 2018-05-29 (×2): qty 0.2

## 2018-05-29 MED ORDER — ACETAMINOPHEN 650 MG RE SUPP
650.0000 mg | Freq: Four times a day (QID) | RECTAL | Status: DC | PRN
  Filled 2018-05-29: qty 1

## 2018-05-29 MED ORDER — ATORVASTATIN CALCIUM 20 MG PO TABS
20.0000 mg | ORAL_TABLET | Freq: Every day | ORAL | Status: DC
  Administered 2018-05-30: 20 mg via ORAL
  Filled 2018-05-29: qty 1

## 2018-05-29 MED ORDER — ALTEPLASE 2 MG IJ SOLR
INTRAMUSCULAR | Status: DC | PRN
  Administered 2018-05-29: 4 mg

## 2018-05-29 MED ORDER — FENTANYL CITRATE (PF) 100 MCG/2ML IJ SOLN
INTRAMUSCULAR | Status: AC
  Filled 2018-05-29: qty 2

## 2018-05-29 MED ORDER — CEFAZOLIN SODIUM-DEXTROSE 2-4 GM/100ML-% IV SOLN
INTRAVENOUS | Status: AC
  Administered 2018-05-29: 11:00:00 via INTRAVENOUS
  Administered 2018-05-30: 08:00:00 2 g via INTRAVENOUS
  Filled 2018-05-29: qty 100

## 2018-05-29 MED ORDER — HEPARIN (PORCINE) 25000 UT/250ML-% IV SOLN
1150.0000 [IU]/h | INTRAVENOUS | Status: DC
  Administered 2018-05-29: 800 [IU]/h via INTRAVENOUS

## 2018-05-29 MED ORDER — LISINOPRIL 20 MG PO TABS
20.0000 mg | ORAL_TABLET | Freq: Every day | ORAL | Status: DC
  Administered 2018-05-30 – 2018-05-31 (×2): 20 mg via ORAL
  Filled 2018-05-29 (×2): qty 1

## 2018-05-29 MED ORDER — ACETAMINOPHEN 325 MG PO TABS: 650.0000 mg | ORAL_TABLET | Freq: Four times a day (QID) | ORAL | Status: DC | PRN

## 2018-05-29 MED ORDER — MIDAZOLAM HCL 2 MG/2ML IJ SOLN
INTRAMUSCULAR | Status: DC | PRN
  Administered 2018-05-29: 2 mg via INTRAVENOUS
  Administered 2018-05-29: 1 mg via INTRAVENOUS

## 2018-05-29 MED ORDER — ONDANSETRON HCL 4 MG/2ML IJ SOLN: 4.0000 mg | Freq: Four times a day (QID) | INTRAMUSCULAR | Status: DC | PRN

## 2018-05-29 MED ORDER — SODIUM CHLORIDE 0.9 % IV SOLN
1.0000 mg/h | INTRAVENOUS | Status: AC
  Administered 2018-05-29: 13:00:00 1 mg/h
  Filled 2018-05-29: qty 10

## 2018-05-29 MED ORDER — FENTANYL CITRATE (PF) 100 MCG/2ML IJ SOLN
INTRAMUSCULAR | Status: DC | PRN
  Administered 2018-05-29: 25 ug via INTRAVENOUS
  Administered 2018-05-29: 50 ug via INTRAVENOUS

## 2018-05-29 MED ORDER — LIDOCAINE-EPINEPHRINE (PF) 1 %-1:200000 IJ SOLN
INTRAMUSCULAR | Status: AC
  Filled 2018-05-29: qty 30

## 2018-05-29 MED ORDER — TAMSULOSIN HCL 0.4 MG PO CAPS
0.4000 mg | ORAL_CAPSULE | Freq: Every day | ORAL | Status: DC
  Administered 2018-05-30: 0.4 mg via ORAL
  Filled 2018-05-29: qty 1

## 2018-05-29 MED ORDER — METOPROLOL TARTRATE 25 MG PO TABS
25.0000 mg | ORAL_TABLET | Freq: Two times a day (BID) | ORAL | Status: DC
  Administered 2018-05-29 – 2018-05-31 (×4): 25 mg via ORAL
  Filled 2018-05-29 (×5): qty 1

## 2018-05-29 MED ORDER — FAMOTIDINE 20 MG PO TABS: 40.0000 mg | ORAL_TABLET | Freq: Once | ORAL | Status: DC | PRN

## 2018-05-29 MED ORDER — HYDROCHLOROTHIAZIDE 25 MG PO TABS
25.0000 mg | ORAL_TABLET | Freq: Every day | ORAL | Status: DC
  Administered 2018-05-30 – 2018-05-31 (×2): 25 mg via ORAL
  Filled 2018-05-29 (×2): qty 1

## 2018-05-29 MED ORDER — ALTEPLASE 2 MG IJ SOLR
INTRAMUSCULAR | Status: AC
  Filled 2018-05-29: qty 4

## 2018-05-29 MED ORDER — SODIUM CHLORIDE 0.9 % IV SOLN
INTRAVENOUS | Status: DC
  Administered 2018-05-29: 09:00:00 via INTRAVENOUS

## 2018-05-29 MED ORDER — MUPIROCIN 2 % EX OINT
TOPICAL_OINTMENT | Freq: Two times a day (BID) | CUTANEOUS | Status: DC
  Administered 2018-05-29: 16:00:00 via NASAL
  Administered 2018-05-29: 1 via NASAL
  Administered 2018-05-30 (×2): via NASAL
  Filled 2018-05-29: qty 22

## 2018-05-29 MED ORDER — METHYLPREDNISOLONE SODIUM SUCC 125 MG IJ SOLR: 125.0000 mg | Freq: Once | INTRAMUSCULAR | Status: DC | PRN

## 2018-05-29 MED ORDER — SODIUM CHLORIDE 0.9 % IV SOLN
INTRAVENOUS | Status: DC
  Administered 2018-05-30 (×2): via INTRAVENOUS

## 2018-05-29 MED ORDER — HEPARIN SODIUM (PORCINE) 1000 UNIT/ML IJ SOLN
INTRAMUSCULAR | Status: DC | PRN
  Administered 2018-05-29: 5000 [IU] via INTRAVENOUS

## 2018-05-29 MED ORDER — IOHEXOL 300 MG/ML  SOLN
INTRAMUSCULAR | Status: DC | PRN
  Administered 2018-05-29: 12:00:00 70 mL via INTRA_ARTERIAL

## 2018-05-29 MED ORDER — FINASTERIDE 5 MG PO TABS
5.0000 mg | ORAL_TABLET | Freq: Every day | ORAL | Status: DC
  Administered 2018-05-30 – 2018-05-31 (×2): 5 mg via ORAL
  Filled 2018-05-29 (×2): qty 1

## 2018-05-29 MED ORDER — INSULIN ASPART 100 UNIT/ML ~~LOC~~ SOLN
0.0000 [IU] | Freq: Every day | SUBCUTANEOUS | Status: DC
  Administered 2018-05-30: 2 [IU] via SUBCUTANEOUS
  Filled 2018-05-29: qty 1

## 2018-05-29 MED ORDER — HEPARIN BOLUS VIA INFUSION
2000.0000 [IU] | Freq: Once | INTRAVENOUS | Status: AC
  Administered 2018-05-29: 2000 [IU] via INTRAVENOUS
  Filled 2018-05-29: qty 2000

## 2018-05-29 MED ORDER — LISINOPRIL-HYDROCHLOROTHIAZIDE 20-25 MG PO TABS: 1.0000 | ORAL_TABLET | Freq: Every day | ORAL | Status: DC

## 2018-05-29 MED ORDER — INSULIN ASPART 100 UNIT/ML ~~LOC~~ SOLN
0.0000 [IU] | Freq: Three times a day (TID) | SUBCUTANEOUS | Status: DC
  Administered 2018-05-29: 7 [IU] via SUBCUTANEOUS
  Administered 2018-05-30: 18:00:00 3 [IU] via SUBCUTANEOUS
  Administered 2018-05-30: 4 [IU] via SUBCUTANEOUS
  Administered 2018-05-31: 3 [IU] via SUBCUTANEOUS
  Filled 2018-05-29 (×4): qty 1

## 2018-05-29 SURGICAL SUPPLY — 22 items
BALLN DORADO 5X200X135 (BALLOONS) ×3
BALLN LUTONIX 018 5X300X130 (BALLOONS) ×3
BALLN ULTRVRSE 3X100X130C (BALLOONS) ×3
BALLOON DORADO 5X200X135 (BALLOONS) IMPLANT
BALLOON LUTONIX 018 5X300X130 (BALLOONS) IMPLANT
BALLOON ULTRVRSE 3X100X130C (BALLOONS) IMPLANT
CANNULA 5F STIFF (CANNULA) ×2 IMPLANT
CATH BEACON 5 .035 65 RIM TIP (CATHETERS) ×2 IMPLANT
CATH BEACON 5 .038 100 VERT TP (CATHETERS) ×2 IMPLANT
CATH INFUS 135CMX50CM (CATHETERS) ×2 IMPLANT
CATH PIG 70CM (CATHETERS) ×2 IMPLANT
DEVICE PRESTO INFLATION (MISCELLANEOUS) ×2 IMPLANT
GLIDEWIRE ADV .035X260CM (WIRE) ×2 IMPLANT
KIT CATH CVC 3 LUMEN 7FR 8IN (MISCELLANEOUS) ×2 IMPLANT
PACK ANGIOGRAPHY (CUSTOM PROCEDURE TRAY) ×3 IMPLANT
SHEATH ANL2 6FRX45 HC (SHEATH) ×2 IMPLANT
SHEATH BRITE TIP 5FRX11 (SHEATH) ×2 IMPLANT
SUT PROLENE 0 CT 1 30 (SUTURE) ×2 IMPLANT
SYR MEDRAD MARK 7 150ML (SYRINGE) ×2 IMPLANT
TUBING CONTRAST HIGH PRESS 72 (TUBING) ×2 IMPLANT
WIRE G V18X300CM (WIRE) ×2 IMPLANT
WIRE J 3MM .035X145CM (WIRE) ×2 IMPLANT

## 2018-05-29 NOTE — H&P (Signed)
[facility] Admission History & Physical  MRN : [id number]  Antonio [name] is a 73 y.o. ([birth year]) male who presents with chief complaint of No chief complaint on file. [name]  History of Present Illness: Patient presents today for treatment of his severe peripheral arterial disease.  About 4 months ago, he underwent extensive left lower extremity revascularization for limb salvage.  He had multiple interventions performed and did well for a few months.  Over the past week or 2, he has had worsening of his left lower extremity symptoms and now describes what is consistent with rest pain.  This is on the left leg.  His noninvasive studies suggest recurrent occlusion of his left SFA stent and a marked reduction in ABIs in the 0.5 range.  He is not having any ulceration or infection.  Current Facility-Administered Medications  Medication Dose Route Frequency Provider Last Rate Last Dose  . atropine 1 MG/10ML injection           . fentaNYL (SUBLIMAZE) 100 MCG/2ML injection           . midazolam (VERSED) 5 MG/5ML injection           . 0.9 %  sodium chloride infusion   Intravenous Continuous Kris Hartmann, NP 75 mL/hr at 05/29/18 0850    . ceFAZolin (ANCEF) 2-4 GM/100ML-% IVPB           . ceFAZolin (ANCEF) IVPB 2g/100 mL premix  2 g Intravenous Once Kris Hartmann, NP      . diphenhydrAMINE (BENADRYL) injection 50 mg  50 mg Intravenous Once PRN Kris Hartmann, NP      . famotidine (PEPCID) tablet 40 mg  40 mg Oral Once PRN Kris Hartmann, NP      . HYDROmorphone (DILAUDID) injection 1 mg  1 mg Intravenous Once PRN Eulogio [name], NP      . methylPREDNISolone sodium succinate (SOLU-MEDROL) 125 mg/2 mL injection 125 mg  125 mg Intravenous Once PRN Eulogio [name], NP      . midazolam (VERSED) 2 MG/ML syrup 8 mg  8 mg Oral Once PRN Kris Hartmann, NP      . ondansetron (ZOFRAN) injection 4 mg  4 mg Intravenous Q6H PRN Kris Hartmann, NP        Past Medical History:   Diagnosis Date  . 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 Coronary atherosclerosis of unspecified type of vessel, native or graft   . Diabetes mellitus    Type II  . Esophageal stricture   . Gastritis   . Hiatal hernia   . Hx of colonic polyps   . Hyperlipidemia   . Hypertension   . Internal hemorrhoids without mention of complication   . Lumbago   . Pain in both lower legs   . PSA elevation    now averaging 2's  . Tobacco abuse   . URI (upper respiratory infection)   . Urinary retention     Past Surgical History:  Procedure Laterality Date  . 2D Echo  06/03  . Abdominal ultrasound  06/03   Negative  . BACK SURGERY    . CT of head  and sinuses  06/03   Negative  . CT of the chest, abdomen, pelvis  06/03   Chest negative; Abdomen, left adrenal lesion; Pelvis, enlarged prostate  . ESOPHAGOGASTRODUODENOSCOPY  02/13/07   gastritis and duodenitis without bleed  . ESOPHAGOGASTRODUODENOSCOPY  N/A 06/26/2014   Procedure: ESOPHAGOGASTRODUODENOSCOPY (EGD);  Surgeon: Milus Banister, MD;  Location: [facility];  Service: Endoscopy;  Laterality: N/A;  . KNEE ARTHROSCOPY  10/00   Right  . LOWER EXTREMITY ANGIOGRAPHY Right 12/27/2017   Procedure: LOWER EXTREMITY ANGIOGRAPHY;  Surgeon: Algernon Huxley, MD;  Location: [facility];  Service: Cardiovascular;  Laterality: Right;  . LOWER EXTREMITY ANGIOGRAPHY Left 01/10/2018   Procedure: LOWER EXTREMITY ANGIOGRAPHY;  Surgeon: Algernon Huxley, MD;  [facility];  Service: Cardiovascular;  Laterality: Left;  . LP  06/03  . Microwave thermotherapy prostate  08/28/07   [facility]ers Blocker  . Persantine cardiolite  03/01   EF 68%  . POLYPECTOMY  11/95  . Stress myoview  07/06   EF 67%    Social History Social History   Tobacco Use  . Smoking status: Current Every Day Smoker    Packs/day: 1.50    Years: 40.00    Pack years: 60.00    Types: Cigarettes  . Smokeless tobacco: Former Systems developer    Types: Chew   Substance Use Topics  . Alcohol use: Yes    Alcohol/week: 0.0 standard drinks    Comment: rare  . Drug use: No    Family History Family History  Problem Relation Age of Onset  . Diabetes Father   . Alcohol abuse Paternal Uncle   . Alcohol abuse Paternal Uncle   . Alcohol abuse Paternal Uncle   . Rheum arthritis Mother   . Heart disease Neg Hx   . Stroke Neg Hx   . Cancer Neg Hx   . Drug abuse Neg Hx   . Depression Neg Hx   . Colon cancer Neg Hx   . Rectal cancer Neg Hx   . Stomach cancer Neg Hx   . Kidney cancer Neg Hx   . Bladder Cancer Neg Hx   . Prostate cancer Neg Hx     No Known Allergies   REVIEW OF SYSTEMS (Negative unless checked)  Constitutional: [] Weight loss  [] Fever  [] Chills Cardiac: [] Chest pain   [] Chest pressure   [] Palpitations   [] Shortness of breath when laying flat   [] Shortness of breath at rest   [x] Shortness of breath with exertion. Vascular:  [x] Pain in legs with walking   [x] Pain in legs at rest   [] Pain in legs when laying flat   [] Claudication   [] Pain in feet when walking  [x] Pain in feet at rest  [] Pain in feet when laying flat   [] History of DVT   [] Phlebitis   [] Swelling in legs   [] Varicose veins   [] Non-healing ulcers Pulmonary:   [] Uses home oxygen   [] Productive cough   [] Hemoptysis   [] Wheeze  [] COPD   [] Asthma Neurologic:  [] Dizziness  [] Blackouts   [] Seizures   [] History of stroke   [] History of TIA  [] Aphasia   [] Temporary blindness   [] Dysphagia   [] Weakness or numbness in arms   [] Weakness or numbness in legs Musculoskeletal:  [x] Arthritis   [] Joint swelling   [x] Joint pain   [] Low back pain Hematologic:  [x] Easy bruising  [] Easy bleeding   [] Hypercoagulable state   [] Anemic  [] Hepatitis Gastrointestinal:  [] Blood in stool   [] Vomiting blood  [] Gastroesophageal reflux/heartburn   [] Difficulty swallowing. Genitourinary:  [] Chronic kidney disease   [] Difficult urination  [] Frequent urination  [] Burning with urination   [] Blood in  urine Skin:  [] Rashes   [] Ulcers   [] Wounds Psychological:  [] History of anxiety   []   History of major depression.  Physical Examination  Vitals:   05/29/18 0823 05/29/18 0825  BP: (!) 166/66   Pulse: (!) 48   Resp: 14   Temp: 97.7 F (36.5 C)   TempSrc: Oral   SpO2: 97%   Weight:  76.2 kg  Height:  5\' 6"  (1.676 m)   Body mass index is 27.12 kg/m. Gen: WD/WN, NAD Head: [place]/AT, No temporalis wasting. Ear/Nose/Throat: Hearing grossly intact, nares w/o erythema or drainage, oropharynx w/o Erythema/Exudate,  Eyes: Conjunctiva clear, sclera non-icteric Neck: Trachea midline.  No JVD.  Pulmonary:  Good air movement, respirations not labored, no use of accessory muscles.  Cardiac: RRR, normal S1, S2. Vascular:  Vessel Right Left  Radial Palpable Palpable                          PT  1+ palpable  not palpable  DP  1+ palpable  trace palpable   Gastrointestinal: soft, non-tender/non-distended. No guarding/reflex.  Musculoskeletal: M/S 5/5 throughout.  Extremities without ischemic changes.  No deformity or atrophy.  Neurologic: Sensation grossly intact in extremities.  Symmetrical.  Speech is fluent. Motor exam as listed above. Psychiatric: Judgment intact, Mood & affect appropriate for pt's clinical situation. Dermatologic: No rashes or ulcers noted.  No cellulitis or open wounds.      CBC Lab Results  Component Value Date   WBC 13.7 (H) 06/26/2014   HGB 14.9 06/26/2014   HCT 43.5 06/26/2014   MCV 86.0 06/26/2014   PLT 291 06/26/2014    BMET    Component Value Date/Time   NA 141 06/27/2017 0842   NA 141 07/10/2014 1025   K 3.9 06/27/2017 0842   CL 105 06/27/2017 0842   CO2 27 06/27/2017 0842   GLUCOSE 210 (H) 06/27/2017 0842   BUN 22 05/29/2018 0847   BUN 12 07/10/2014 1025   CREATININE 0.90 05/29/2018 0847   CALCIUM 9.3 06/27/2017 0842   GFRNONAA >60 05/29/2018 0847   GFRAA >60 05/29/2018 0847   Estimated Creatinine Clearance: 66 mL/min (by C-G  formula based on SCr of 0.9 mg/dL).  COAG No results found for: INR, PROTIME  Radiology Vas [place] Abi With/wo Tbi  Result Date: 05/21/2018 LOWER EXTREMITY DOPPLER STUDY Indications: Rest pain.  Vascular Interventions: 01/10/2018: PTA of the Lt Posterior Tibial Artery,                         Entire Lt SFA and Above Knee Popliteal Artery. PTA of                         the Lt Profunda Femoral Artery.                          12/27/2017:                         PTA of the Right Peroneal Artery, Rt Anterior Tibial                         Artery, Rt SFA and Politeal Artery.                         PTA of the Rt CFAand Rt External Iliac Artery. Comparison Study: 02/08/2018 Performing Technologist: Almira Coaster RVS  Examination Guidelines: A  complete evaluation includes at minimum, Doppler waveform signals and systolic blood pressure reading at the level of bilateral brachial, anterior tibial, and posterior tibial arteries, when vessel segments are accessible. Bilateral testing is considered an integral part of a complete examination. Photoelectric Plethysmograph (PPG) waveforms and toe systolic pressure readings are included as required and additional duplex testing as needed. Limited examinations for reoccurring indications may be performed as noted.  ABI Findings: +---------+------------------+-----+----------+--------+ Right    Rt Pressure (mmHg)IndexWaveform  Comment  +---------+------------------+-----+----------+--------+ Brachial 172                                       +---------+------------------+-----+----------+--------+ ATA      125               0.73 monophasic         +---------+------------------+-----+----------+--------+ PTA      138               0.80 monophasic         +---------+------------------+-----+----------+--------+ Great Toe89                0.52 Abnormal           +---------+------------------+-----+----------+--------+  +---------+------------------+-----+----------+-------+ Left     Lt Pressure (mmHg)IndexWaveform  Comment +---------+------------------+-----+----------+-------+ Brachial 165                                      +---------+------------------+-----+----------+-------+ ATA      91                0.53 monophasic        +---------+------------------+-----+----------+-------+ PTA      93                0.54 monophasic        +---------+------------------+-----+----------+-------+ Great Toe57                0.33 Abnormal          +---------+------------------+-----+----------+-------+ +-------+-----------+-----------+------------+------------+ ABI/TBIToday's ABIToday's TBIPrevious ABIPrevious TBI +-------+-----------+-----------+------------+------------+ Right  .80        .52        .80         .58          +-------+-----------+-----------+------------+------------+ Left   .54        .33        .74         .37          +-------+-----------+-----------+------------+------------+ Left ABIs appear decreased compared to prior study on 02/08/2018. Bilateral TBIs appear essentially unchanged compared to prior study on 02/08/2018.  Summary: Right: Resting right ankle-brachial index indicates mild right lower extremity arterial disease. The right toe-brachial index is abnormal. Left: Resting left ankle-brachial index indicates moderate left lower extremity arterial disease. The left toe-brachial index is abnormal.  *See table(s) above for measurements and observations.  Electronically signed by Leotis Pain MD on 05/21/2018 at 12:12:49 PM.    Final    Vas [place] Lower Extremity Arterial Duplex  Result Date: 05/21/2018 LOWER EXTREMITY ARTERIAL DUPLEX STUDY  Current ABI: Rt .80, Lt .54 Performing Technologist: Almira Coaster RVS  Examination Guidelines: A complete evaluation includes B-mode imaging, spectral Doppler, color Doppler, and power Doppler as needed of all accessible portions  of each vessel. Bilateral testing is considered an integral part of a complete examination.  Limited examinations for reoccurring indications may be performed as noted.  Right Duplex Findings: +-----------+--------+-----+--------+----------+--------+            PSV cm/sRatioStenosisWaveform  Comments +-----------+--------+-----+--------+----------+--------+ CFA Distal 109                  monophasic         +-----------+--------+-----+--------+----------+--------+ DFA        26                   biphasic           +-----------+--------+-----+--------+----------+--------+ SFA Prox   70                   biphasic           +-----------+--------+-----+--------+----------+--------+ SFA Mid    41                   monophasic         +-----------+--------+-----+--------+----------+--------+ SFA Distal 50                   monophasic         +-----------+--------+-----+--------+----------+--------+ POP Distal 63                   monophasic         +-----------+--------+-----+--------+----------+--------+ ATA Distal 21                   monophasic         +-----------+--------+-----+--------+----------+--------+ PTA Distal 74                   monophasic         +-----------+--------+-----+--------+----------+--------+ PERO Distal9                    monophasic         +-----------+--------+-----+--------+----------+--------+  Right Stent(s): +--------------+---++----------++ Prox to Stent 29monophasic +--------------+---++----------++ Proximal Stent85 monophasic +--------------+---++----------++ Mid Stent     41 monophasic +--------------+---++----------++ Distal Stent  49 monophasic +--------------+---++----------++  Left Duplex Findings: +-----------+--------+-----+--------+----------+--------+            PSV cm/sRatioStenosisWaveform  Comments +-----------+--------+-----+--------+----------+--------+ CFA Distal 133                   monophasic         +-----------+--------+-----+--------+----------+--------+ DFA        86                   monophasic         +-----------+--------+-----+--------+----------+--------+ SFA Prox   22                   monophasic         +-----------+--------+-----+--------+----------+--------+ SFA Mid    0                    occluded           +-----------+--------+-----+--------+----------+--------+ SFA Distal 213                  monophasic         +-----------+--------+-----+--------+----------+--------+ POP Distal 33                   monophasic         +-----------+--------+-----+--------+----------+--------+ ATA Distal 14                   monophasic         +-----------+--------+-----+--------+----------+--------+  PTA Distal 39                   monophasic         +-----------+--------+-----+--------+----------+--------+ PERO Distal22                   monophasic         +-----------+--------+-----+--------+----------+--------+  Left Stent(s): +--------------+---++----------+----------+ Prox to Stent 11monophasic           +--------------+---++----------+----------+ Proximal Stent86 monophasicProfunda   +--------------+---++----------+----------+ Mid Stent     0  Occluded  SFA Mid    +--------------+---++----------+----------+ Distal Stent  0  occluded  SFA Distal +--------------+---++----------+----------+  Summary: Left: Left SFA Stent appears to be occluded in the Mid and Distal segment. Collateral flow seen in the SFA Distal segment.  See table(s) above for measurements and observations. Electronically signed by Leotis Pain MD on 05/21/2018 at 12:12:47 PM.    Final      Assessment/Plan 1.  PAD with rest pain of the left leg.  Claudication symptoms on the right.  Symptoms have worsened and his noninvasive study suggests left SFA stent occlusion.  At this point, patient is being brought in for angiogram and  possible revascularization for limb salvage.  Risks and benefits are discussed. 2.  Tobacco use.  Clearly worsening the patency of his stents and worsening the progression of his disease.  Smoking cessation would be very important. 3.  Hypertension.  Stable on outpatient medications and blood pressure control important in reducing the progression of atherosclerotic disease. On appropriate oral medications. 4.  Diabetes. Stable on outpatient medication and blood glucose control important in reducing the progression of atherosclerotic disease. Also, involved in wound healing. On appropriate medications.    Leotis Pain, MD  05/29/2018 10:39 AM

## 2018-05-29 NOTE — Progress Notes (Addendum)
ANTICOAGULATION CONSULT NOTE - Initial Consult  Pharmacy Consult for Heparin  Indication: DVT treatment (S/P limb revascularization)   No Known Allergies  Patient Measurements: Height: 5\' 6"  (167.6 cm) Weight: 164 lb 10.9 oz (74.7 kg) IBW/kg (Calculated) : 63.8 Heparin Dosing Weight: 74.7 kg   Vital Signs: Temp: 97.7 F (36.5 C) (04/22 1600) Temp Source: Oral (04/22 1600) BP: 228/152 (04/22 1800) Pulse Rate: 55 (04/22 1800)  Labs: Recent Labs    05/29/18 0847 05/29/18 1327 05/29/18 1818  HGB  --  14.0 13.6  HCT  --  41.2 40.2  PLT  --  240 231  APTT  --  109*  --   LABPROT  --  14.0  --   INR  --  1.1  --   HEPARINUNFRC  --  0.31 0.10*  CREATININE 0.90  --   --     Estimated Creatinine Clearance: 66 mL/min (by C-G formula based on SCr of 0.9 mg/dL).   Medical History: Past Medical History:  Diagnosis Date  . 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 Coronary atherosclerosis of unspecified type of vessel, native or graft   . Diabetes mellitus    Type II  . Esophageal stricture   . Gastritis   . Hiatal hernia   . Hx of colonic polyps   . Hyperlipidemia   . Hypertension   . Internal hemorrhoids without mention of complication   . Lumbago   . Pain in both lower legs   . PSA elevation    now averaging 2's  . Tobacco abuse   . URI (upper respiratory infection)   . Urinary retention     Medications:  Medications Prior to Admission  Medication Sig Dispense Refill Last Dose  . aspirin 81 MG tablet Take 81 mg by mouth daily.   05/29/2018 at Unknown time  . atorvastatin (LIPITOR) 20 MG tablet Take 1 tablet by mouth every evening for cholesterol. 90 tablet 3 05/29/2018 at Unknown time  . clopidogrel (PLAVIX) 75 MG tablet Take 1 tablet (75 mg total) by mouth daily. 30 tablet 11 Past Week at Unknown time  . finasteride (PROSCAR) 5 MG tablet Take 1 tablet (5 mg total) by mouth daily. 90 tablet 3 05/29/2018 at Unknown time  . glipiZIDE  (GLUCOTROL) 10 MG tablet Take 10 mg by mouth daily before supper.    05/28/2018 at Unknown time  . glucose blood ([employer] TEST STRIPS) test strip Use one strip three times daily.   05/28/2018 at Unknown time  . insulin detemir (LEVEMIR) 100 UNIT/ML injection Inject 20 units at bedtime. (Patient taking differently: every morning. Inject 20 units at breakfast) 10 pen 5 05/28/2018 at Unknown time  . lisinopril-hydrochlorothiazide (PRINZIDE,ZESTORETIC) 20-25 MG tablet TAKE 1 TABLET BY MOUTH ONCE DAILY FOR BLOOD PRESSURE 90 tablet 1 05/29/2018 at Unknown time  . metFORMIN (GLUCOPHAGE) 500 MG tablet TAKE TWO TABLETS BY MOUTH TWICE DAILY WITH A MEAL for diabetes. 360 tablet 3 05/28/2018 at Unknown time  . metoprolol tartrate (LOPRESSOR) 50 MG tablet TAKE 1/2 (ONE-HALF) TABLET BY MOUTH TWICE DAILY 90 tablet 1 Past Week at Unknown time  . pantoprazole (PROTONIX) 40 MG tablet Take 1 tablet (40 mg total) by mouth daily. 90 tablet 3 05/29/2018 at Unknown time  . tamsulosin (FLOMAX) 0.4 MG CAPS capsule Take two capsule daily 180 capsule 4 05/29/2018 at Unknown time    Assessment: Pharmacy has been consulted to monitor Heparin levels in this 73 yo male post-  revascularization surgery.  Patient is also receiving alteplase as part of post-procedural regimen @ 1mg /hr intracatheter x 4 hours  Vascular has initiated heparin therapy with 5000 unit bolus @ 1124 followed by 800 units/hour and schedule anti-Xa levels, CBC, and Fibrinogen q6h x 4 post procedure.   Goal of Therapy:   HL target 0.2-0.5 per consult instructions  Monitor platelets by anticoagulation protocol: Yes   Plan:  Will continue rate of 800 units/hr and follow HL/CBC in 6 hours per consult and adjust Heparin rate for target of 0.2-0.5.  Will monitor H and H accordingly.  4/22: HL @ 1818 = 0.1 Will order Heparin 2000 units IV X 1 bolus and increase drip rate to 1100 units/hr. Will recheck HL 8 hrs after rate change.   Antonio [name]  D 05/29/2018,7:11 PM

## 2018-05-29 NOTE — Consult Note (Signed)
ANTICOAGULATION CONSULT NOTE - Initial Consult  Pharmacy Consult for Heparin Drip Monitoring Indication: VTE treatment (post revascularization for limb salvage)  No Known Allergies  Patient Measurements: Height: 5\' 6"  (167.6 cm) Weight: 168 lb (76.2 kg) IBW/kg (Calculated) : 63.8 Heparin Dosing Weight: 76.2 kg  Vital Signs: Temp: 97.7 F (36.5 C) (04/22 0823) Temp Source: Oral (04/22 0823) BP: 129/56 (04/22 1215) Pulse Rate: 73 (04/22 1215)  Labs: Recent Labs    05/29/18 0847  CREATININE 0.90    Estimated Creatinine Clearance: 66 mL/min (by C-G formula based on SCr of 0.9 mg/dL).   Medical History: Past Medical History:  Diagnosis Date  . 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 Coronary atherosclerosis of unspecified type of vessel, native or graft   . Diabetes mellitus    Type II  . Esophageal stricture   . Gastritis   . Hiatal hernia   . Hx of colonic polyps   . Hyperlipidemia   . Hypertension   . Internal hemorrhoids without mention of complication   . Lumbago   . Pain in both lower legs   . PSA elevation    now averaging 2's  . Tobacco abuse   . URI (upper respiratory infection)   . Urinary retention     Medications:  DAPT only PTA, no anticoagulation  Assessment: Pharmacy has been consulted to monitor Heparin levels in this 73 yo male post- revascularization surgery.  Patient is also receiving alteplase as part of post-procedural regimen @ 1mg /hr intracatheter x 4 hours  Vascular has initiated heparin therapy with 5000 unit bolus @ 1124 followed by 800 units/hour and schedule anti-Xa levels, CBC, and Fibrinogen q6h x 4 post procedure.   Goal of Therapy:   HL target 0.2-0.5 per consult instructions  Monitor platelets by anticoagulation protocol: Yes   Plan:  Will continue rate of 800 units/hr and follow HL/CBC in 6 hours per consult and adjust Heparin rate for target of 0.2-0.5.  Will monitor H and H  accordingly.  Lu Duffel, PharmD, BCPS Clinical Pharmacist 05/29/2018 12:44 PM

## 2018-05-29 NOTE — Op Note (Signed)
[facility]  Percutaneous Study/Intervention Procedural Note   Date of Surgery: 05/29/2018  Surgeon(s):Jansen Goodpasture    Assistants:none  Pre-operative Diagnosis: PAD with rest pain left leg  Post-operative diagnosis:  Same  Procedure(s) Performed:             1.  Ultrasound guidance for vascular access right femoral artery             2.  Catheter placement into left common femoral artery from right femoral approach             3.  Aortogram and selective left lower extremity angiogram             4.  Percutaneous transluminal angioplasty of the left posterior tibial artery with 3 mm balloon             5.  Percutaneous transluminal angioplasty of the left SFA and popliteal artery with 5 mm balloon  6.  Infusion of 4 mg of tpa to the left SFA and popliteal arteries using a thrombolytic catheter and leaving the catheter in place for continuous infusion             7.  US guidance for access to right femoral vein with placement of a triple lumen catheter in the right femoral vein with [place] and fluro guidance  EBL: 30 cc  Contrast: 70 cc  Fluoro Time: 7.6 minutes  Moderate Conscious Sedation Time: approximately 40 minutes using 3 mg of Versed and 75 mcg of Fentanyl              Indications:  Patient is a 73 y.o.male with rest pain symptoms of the left leg after previous intervention several months ago. The patient has noninvasive study showing occlusion versus previous left SFA stent with a markedly reduced ABI. The patient is brought in for angiography for further evaluation and potential treatment.  Due to the limb threatening nature of the situation, angiogram was performed for attempted limb salvage. The patient is aware that if the procedure fails, amputation would be expected.  The patient also understands that even with successful revascularization, amputation may still be required due to the severity of the situation.  Risks and benefits are discussed and  informed consent is obtained.   Procedure:  The patient was identified and appropriate procedural time out was performed.  The patient was then placed supine on the table and prepped and draped in the usual sterile fashion. Moderate conscious sedation was administered during a face to face encounter with the patient throughout the procedure with my supervision of the RN administering medicines and monitoring the patient's vital signs, pulse oximetry, telemetry and mental status throughout from the start of the procedure until the patient was taken to the recovery room. Ultrasound was used to evaluate the right common femoral artery.  It was patent but heavily diseased.  A digital ultrasound image was acquired.  A Seldinger needle was used to access the right common femoral artery under direct ultrasound guidance and a permanent image was performed.  A 0.035 J wire was advanced without resistance and a 5Fr sheath was placed.  Pigtail catheter was placed into the aorta and an AP aortogram was performed. This demonstrated normal renal arteries and normal aorta and iliac segments without significant stenosis. I then crossed the aortic bifurcation and advanced to the left femoral head. Selective left lower extremity angiogram was then performed. This demonstrated a flush occlusion of the SFA, 70-80% stenosis of the left  profunda femorus artery.  There was reconstitution of the popliteal artery above the knee.  The posterior tibial was the best runoff distally but it had about a 70% stenosis proximally.  The peroneal artery was patent without stenosis.  The AT was chronically occluded. . It was felt that it was in the patient's best interest to proceed with intervention after these images to avoid a second procedure and a larger amount of contrast and fluoroscopy based off of the findings from the initial angiogram. The patient was systemically heparinized and a 6 [place] Ansell sheath was then placed over the [employer] wire. I then used a Kumpe catheter and the advantage wire to get into the SFA occlusion which we entered with little difficulty and cross the occlusion again with little difficulty.  An image in the below-knee popliteal artery showed better runoff and did confirm a moderate to high-grade stenosis in the posterior tibial artery origin.  I then exchanged for a 0.018 wire and proceeded with treatment.  Using the Kumpe catheter and the V 18 wire across the stenosis the posterior tibial artery without difficulty and parked the wire in the foot.  A 3 mm diameter by 10 cm length angioplasty balloon was then inflated in the proximal left posterior tibial artery up to 8 atm for 1 minute.  Completion imaging showed only about a 20 to 30% residual stenosis in this location.  I then used a 5 mm diameter by 30 cm length angioplasty balloon and this was a Lutonix drug-coated angioplasty balloon for the SFA.  This however burst at 14 atm and so we exchanged for a 5 mm diameter by 20 cm length [place] high-pressure angioplasty.  2 inflations with this balloon were used to treat the entirety of the SFA and the proximal popliteal artery.  These inflations were 12 to 14 atm for 1 minute.  Completion imaging showed a large volume of thrombus in the left SFA with very poor flow distally and particularly given the acute on chronic nature of his ischemic event I felt our best bet for revascularization would be catheter directed thrombolytic therapy.  I then exchanged for a 135 cm total length 50 cm working length thrombolytic catheter.  This was taken down to the popliteal artery in its mid to distal segment and the proximal portion was in the common femoral artery.  4 mg of TPA was then instilled through the catheter.  The catheter was then secured into place as was the 6 French sheath for continuous infusion of thrombolytic therapy overnight.  I elected to terminate the procedure with the plan to return for a repeat angiogram  tomorrow morning. The ultrasound was then used to visualize the right femoral vein which was widely patent.  The right femoral vein was then accessed under direct ultrasound guidance without difficulty with a Seldinger needle.  A J-wire was placed.  After skin nick and dilatation a triple-lumen catheter was placed over the wire and the wire was removed.  Fluoroscopy confirmed the tip of the catheter in the proximal right iliac vein or iliac vein confluence to the IVC.  The triple-lumen catheter was then secured into place with 2 silk sutures and sterile dressings were placed.  The patient was taken to the recovery room in stable condition having tolerated the procedure well.  Findings:               Aortogram:  normal renal arteries, normal aorta and iliac arteries without stenosis down to the  left common femoral artery which was diseased             Left Lower Extremity:  flush occlusion of the SFA, 70-80% stenosis of the left profunda femorus artery.  There was reconstitution of the popliteal artery above the knee.  The posterior tibial was the best runoff distally but it had about a 70% stenosis proximally.  The peroneal artery was patent without stenosis.  The AT was chronically occluded.   Disposition: Patient was taken to the recovery room in stable condition having tolerated the procedure well.  Complications: None  Leotis Pain 05/29/2018 12:01 PM   This note was created with Dragon Medical transcription system. Any errors in dictation are purely unintentional.

## 2018-05-30 ENCOUNTER — Encounter: Payer: Self-pay | Admitting: Vascular Surgery

## 2018-05-30 ENCOUNTER — Encounter
Admission: RE | Disposition: A | Discharge status: Home / Self Care | Payer: Self-pay | Source: Home / Self Care | Attending: Vascular Surgery

## 2018-05-30 DIAGNOSIS — I70222 Atherosclerosis of native arteries of extremities with rest pain, left leg: Secondary | ICD-10-CM

## 2018-05-30 HISTORY — PX: LOWER EXTREMITY ANGIOGRAPHY: CATH118251

## 2018-05-30 LAB — CBC
HCT: 39.6 % (ref 39.0–52.0)
Hemoglobin: 13.4 g/dL (ref 13.0–17.0)
MCH: 29.6 pg (ref 26.0–34.0)
MCHC: 33.8 g/dL (ref 30.0–36.0)
MCV: 87.6 fL (ref 80.0–100.0)
Platelets: 226 10*3/uL (ref 150–400)
RBC: 4.52 MIL/uL (ref 4.22–5.81)
RDW: 13.3 % (ref 11.5–15.5)
WBC: 10.5 10*3/uL (ref 4.0–10.5)
nRBC: 0 % (ref 0.0–0.2)

## 2018-05-30 LAB — BASIC METABOLIC PANEL
Anion gap: 8 (ref 5–15)
BUN: 17 mg/dL (ref 8–23)
CO2: 25 mmol/L (ref 22–32)
Calcium: 8.4 mg/dL — ABNORMAL LOW (ref 8.9–10.3)
Chloride: 104 mmol/L (ref 98–111)
Creatinine, Ser: 0.64 mg/dL (ref 0.61–1.24)
GFR calc Af Amer: >60 mL/min (ref >60)
GFR calc non Af Amer: >60 mL/min (ref >60)
Glucose, Bld: 160 mg/dL — ABNORMAL HIGH (ref 70–99)
Potassium: 3.9 mmol/L (ref 3.5–5.1)
Sodium: 137 mmol/L (ref 135–145)

## 2018-05-30 LAB — GLUCOSE, CAPILLARY
Glucose-Capillary: 162 mg/dL — ABNORMAL HIGH (ref 70–99)
Glucose-Capillary: 166 mg/dL — ABNORMAL HIGH (ref 70–99)
Glucose-Capillary: 154 mg/dL — ABNORMAL HIGH (ref 70–99)
Glucose-Capillary: 196 mg/dL — ABNORMAL HIGH (ref 70–99)
Glucose-Capillary: 138 mg/dL — ABNORMAL HIGH (ref 70–99)
Glucose-Capillary: 203 mg/dL — ABNORMAL HIGH (ref 70–99)

## 2018-05-30 LAB — FIBRINOGEN
Fibrinogen: 369 mg/dL (ref 210–475)
Fibrinogen: 382 mg/dL (ref 210–475)

## 2018-05-30 LAB — MAGNESIUM: Magnesium: 1.8 mg/dL (ref 1.7–2.4)

## 2018-05-30 LAB — HEPARIN LEVEL (UNFRACTIONATED)
Heparin Unfractionated: 0.2 IU/mL — ABNORMAL LOW (ref 0.30–0.70)
Heparin Unfractionated: 0.3 IU/mL (ref 0.30–0.70)

## 2018-05-30 SURGERY — LOWER EXTREMITY ANGIOGRAPHY
Anesthesia: Moderate Sedation | Laterality: Left

## 2018-05-30 MED ORDER — HEPARIN SODIUM (PORCINE) 1000 UNIT/ML IJ SOLN
INTRAMUSCULAR | Status: DC | PRN
  Administered 2018-05-30: 3000 [IU] via INTRAVENOUS

## 2018-05-30 MED ORDER — TIROFIBAN HCL IV 12.5 MG/250 ML
INTRAVENOUS | Status: AC
  Administered 2018-05-30: 10:00:00 1910 ug via INTRAVENOUS
  Filled 2018-05-30: qty 250

## 2018-05-30 MED ORDER — LIDOCAINE-EPINEPHRINE (PF) 1 %-1:200000 IJ SOLN
INTRAMUSCULAR | Status: AC
  Filled 2018-05-30: qty 30

## 2018-05-30 MED ORDER — TIROFIBAN HCL IN NACL 5-0.9 MG/100ML-% IV SOLN
0.1500 ug/kg/min | INTRAVENOUS | Status: DC
  Administered 2018-05-30: 0.15 ug/kg/min via INTRAVENOUS
  Filled 2018-05-30 (×4): qty 100

## 2018-05-30 MED ORDER — MIDAZOLAM HCL 5 MG/5ML IJ SOLN
INTRAMUSCULAR | Status: AC
  Filled 2018-05-30: qty 5

## 2018-05-30 MED ORDER — FENTANYL CITRATE (PF) 100 MCG/2ML IJ SOLN
INTRAMUSCULAR | Status: AC
  Filled 2018-05-30: qty 2

## 2018-05-30 MED ORDER — SODIUM CHLORIDE 0.9 % IV SOLN
INTRAVENOUS | Status: DC
  Administered 2018-05-30: 09:00:00 via INTRAVENOUS

## 2018-05-30 MED ORDER — FENTANYL CITRATE (PF) 100 MCG/2ML IJ SOLN
INTRAMUSCULAR | Status: DC | PRN
  Administered 2018-05-30 (×2): 50 ug via INTRAVENOUS

## 2018-05-30 MED ORDER — ONDANSETRON HCL 4 MG/2ML IJ SOLN: 4.0000 mg | Freq: Four times a day (QID) | INTRAMUSCULAR | Status: DC | PRN

## 2018-05-30 MED ORDER — HEPARIN SODIUM (PORCINE) 1000 UNIT/ML IJ SOLN
INTRAMUSCULAR | Status: AC
  Filled 2018-05-30: qty 1

## 2018-05-30 MED ORDER — APIXABAN 5 MG PO TABS: 5.0000 mg | ORAL_TABLET | Freq: Two times a day (BID) | ORAL | 11 refills | Status: DC

## 2018-05-30 MED ORDER — HEPARIN SODIUM (PORCINE) 10000 UNIT/ML IJ SOLN
INTRAMUSCULAR | Status: AC
  Filled 2018-05-30: qty 1

## 2018-05-30 MED ORDER — CEFAZOLIN SODIUM-DEXTROSE 2-4 GM/100ML-% IV SOLN
2.0000 g | Freq: Once | INTRAVENOUS | Status: AC
  Administered 2018-05-30: 2 g via INTRAVENOUS
  Filled 2018-05-30: qty 100

## 2018-05-30 MED ORDER — HYDROMORPHONE HCL 1 MG/ML IJ SOLN: 1.0000 mg | Freq: Once | INTRAMUSCULAR | Status: DC | PRN

## 2018-05-30 MED ORDER — OXYCODONE HCL 5 MG PO TABS: ORAL_TABLET | ORAL | 0 refills | Status: DC

## 2018-05-30 MED ORDER — DIPHENHYDRAMINE HCL 50 MG/ML IJ SOLN: 50.0000 mg | Freq: Once | INTRAMUSCULAR | Status: DC | PRN

## 2018-05-30 MED ORDER — IOHEXOL 300 MG/ML  SOLN
INTRAMUSCULAR | Status: DC | PRN
  Administered 2018-05-30: 09:00:00 60 mL via INTRAVENOUS

## 2018-05-30 MED ORDER — MIDAZOLAM HCL 2 MG/2ML IJ SOLN
INTRAMUSCULAR | Status: DC | PRN
  Administered 2018-05-30 (×2): 2 mg via INTRAVENOUS

## 2018-05-30 MED ORDER — MIDAZOLAM HCL 2 MG/ML PO SYRP: 8.0000 mg | ORAL_SOLUTION | Freq: Once | ORAL | Status: DC | PRN

## 2018-05-30 MED ORDER — TIROFIBAN (AGGRASTAT) BOLUS VIA INFUSION
25.0000 ug/kg | Freq: Once | INTRAVENOUS | Status: AC
  Administered 2018-05-30: 1910 ug via INTRAVENOUS
  Filled 2018-05-30: qty 39

## 2018-05-30 MED ORDER — FAMOTIDINE 20 MG PO TABS: 40.0000 mg | ORAL_TABLET | Freq: Once | ORAL | Status: DC | PRN

## 2018-05-30 MED ORDER — HYDRALAZINE HCL 20 MG/ML IJ SOLN
10.0000 mg | Freq: Once | INTRAMUSCULAR | Status: AC
  Administered 2018-05-30: 11:00:00 10 mg via INTRAVENOUS
  Filled 2018-05-30: qty 1

## 2018-05-30 MED ORDER — METHYLPREDNISOLONE SODIUM SUCC 125 MG IJ SOLR: 125.0000 mg | Freq: Once | INTRAMUSCULAR | Status: DC | PRN

## 2018-05-30 MED ORDER — TIROFIBAN (AGGRASTAT) BOLUS VIA INFUSION
25.0000 ug/kg | Freq: Once | INTRAVENOUS | Status: AC
  Administered 2018-05-30: 1910 ug via INTRAVENOUS

## 2018-05-30 MED ORDER — TIROFIBAN HCL IN NACL 5-0.9 MG/100ML-% IV SOLN
0.1500 ug/kg/min | INTRAVENOUS | Status: AC
  Filled 2018-05-30 (×2): qty 100

## 2018-05-30 SURGICAL SUPPLY — 17 items
BALLN DORADO 5X60X80 (BALLOONS) ×3
BALLN LUTONIX 018 5X80X130 (BALLOONS) ×6
BALLOON DORADO 5X60X80 (BALLOONS) IMPLANT
BALLOON LUTONIX 018 5X80X130 (BALLOONS) IMPLANT
CANISTER PENUMBRA ENGINE (MISCELLANEOUS) ×2 IMPLANT
CATH BEACON 5 .038 100 VERT TP (CATHETERS) ×2 IMPLANT
CATH INDIGO CAT6 KIT (CATHETERS) ×2 IMPLANT
DEVICE PRESTO INFLATION (MISCELLANEOUS) ×2 IMPLANT
DEVICE SAFEGUARD 24CM (GAUZE/BANDAGES/DRESSINGS) ×2 IMPLANT
DEVICE STARCLOSE SE CLOSURE (Vascular Products) ×2 IMPLANT
GLIDEWIRE ADV .035X260CM (WIRE) ×2 IMPLANT
PACK ANGIOGRAPHY (CUSTOM PROCEDURE TRAY) ×3 IMPLANT
SHEATH PINNACLE ST 6F 45CM (SHEATH) ×2 IMPLANT
STENT VIABAHN 6X7.5X120 (Permanent Stent) ×2 IMPLANT
SYR MEDRAD MARK 7 150ML (SYRINGE) ×2 IMPLANT
TUBING CONTRAST HIGH PRESS 72 (TUBING) ×2 IMPLANT
WIRE G V18X300CM (WIRE) ×2 IMPLANT

## 2018-05-30 NOTE — H&P (Signed)
[facility] History & Physical Update  The patient was interviewed and re-examined.  The patient's previous History and Physical has been reviewed and is unchanged.  There is no change in the plan of care. We plan to proceed with the scheduled procedure.  Leotis Pain, MD  05/30/2018, 7:52 AM

## 2018-05-30 NOTE — Progress Notes (Signed)
Paged Vascular. Patient has agreed to stay overnight vs. going home per Dr Lucky Cowboy recommendations.

## 2018-05-30 NOTE — Op Note (Signed)
[facility]  Percutaneous Study/Intervention Procedural Note   Date of Surgery: 05/30/2018  Surgeon(s):Aaryana Betke    Assistants:none  Pre-operative Diagnosis: PAD with rest pain left lower extremity  Post-operative diagnosis:  Same  Procedure(s) Performed:             1.   Left lower extremity angiogram             2.  Catheter placement into left posterior tibial artery from right femoral approach             3.   Mechanical thrombectomy to the left common femoral artery and superficial femoral arteries with the penumbra cat 6 device             4.  Percutaneous transluminal angioplasty of left common femoral artery and proximal SFA with 5 mm diameter by 8 cm length Lutonix drug-coated angioplasty balloon             5.   Viabahn stent placement to the distal left SFA and proximal popliteal artery with 6 mm diameter by 7.5 cm length covered stent  6.  StarClose closure device right femoral artery  EBL: 175 cc  Contrast: 60 cc  Fluoro Time: 6 minutes  Moderate Conscious Sedation Time: approximately 30 minutes using 4 mg of Versed and 100 Mcg of Fentanyl              Indications:  Patient is a 72 y.o.male with limb threatening ischemia with rest pain in the left lower extremity.  He was started on thrombolytic therapy overnight and he is brought back for second look angiography with intervention. Due to the limb threatening nature of the situation, angiogram was performed for attempted limb salvage. The patient is aware that if the procedure fails, amputation would be expected.  The patient also understands that even with successful revascularization, amputation may still be required due to the severity of the situation. Risks and benefits are discussed and informed consent is obtained.   Procedure:  The patient was identified and appropriate procedural time out was performed.  The patient was then placed supine on the table and prepped and draped in the usual  sterile fashion. Moderate conscious sedation was administered during a face to face encounter with the patient throughout the procedure with my supervision of the RN administering medicines and monitoring the patient's vital signs, pulse oximetry, telemetry and mental status throughout from the start of the procedure until the patient was taken to the recovery room.  A V 18 wire was placed and the existing thrombolytic catheter was removed.  Selective left lower extremity angiogram was then performed through the sheath. This demonstrated partially occlusive thrombus in the common femoral artery and the proximal portions of the SFA and profunda femoris artery.  The remainder of the SFA stents that were previously placed were patent without thrombosis.  There was significant stenosis of greater than 70% with some partially occluding thrombus in the proximal popliteal artery just beyond the previously placed stents.  The intervention on the posterior tibial artery from the day previous appear to be patent with less than 30% residual stenosis.  The peroneal artery also remained patent. It was felt that it was in the patient's best interest to proceed with intervention after these images to avoid a second procedure and a larger amount of contrast and fluoroscopy based off of the findings from the initial angiogram. The patient was systemically heparinized and I exchanged for a destination sheath sheath  was then placed over the [employer] wire. I then advanced a Kumpe catheter down into the posterior tibial artery and remove the advantage wire and replaced a 0.018 wire.  Mechanical thrombectomy was then performed in the left common femoral artery and superficial femoral artery with significant improvement in the residual thrombus in these locations but stenosis remained.  For the area is distal to the previously placed stent in the proximal popliteal artery I placed a 6 mm diameter by 7.5 cm length Viabahn stent.  I  then used a 5 mm diameter by 8 cm length Lutonix drug-coated angioplasty balloon to post dilate the stent in the distal SFA and proximal popliteal artery.  A 5 mm diameter by 8 cm length Lutonix drug-coated angioplasty balloon was used in the left common femoral artery and proximal SFA to treat the lesion in this location.  This was inflated to 12 atm for 1 minute.  Completion imaging still showed narrowing in the common femoral artery in the proximal portions of the SFA and profunda femoris artery of greater than 50%, but this was in a location that stent extension would not be appropriate.  A femoral endarterectomy would be planned in the future.  I elected to terminate the procedure. The sheath was removed and StarClose closure device was deployed in the right femoral artery with excellent hemostatic result.  It should be noted that there was significant disease in the right common femoral artery as well of greater than 80% and a femoral endarterectomy on the right will likely be required as well.  The patient was taken to the recovery room in stable condition having tolerated the procedure well.  Findings:                          Left Lower Extremity:  There was partially occlusive thrombus in the common femoral artery and the proximal portions of the SFA and profunda femoris artery.  The remainder of the SFA stents that were previously placed were patent without thrombosis.  There was significant stenosis of greater than 70% with some partially occluding thrombus in the proximal popliteal artery just beyond the previously placed stents.  The intervention on the posterior tibial artery from the day previous appear to be patent with less than 30% residual stenosis.  The peroneal artery also remained patent.   Disposition: Patient was taken to the recovery room in stable condition having tolerated the procedure well.  Complications: None  Leotis Pain 05/30/2018 8:44 AM   This note was created with  Dragon Medical transcription system. Any errors in dictation are purely unintentional.

## 2018-05-30 NOTE — Progress Notes (Signed)
Per Dr Lucky Cowboy give one time dose of hydralazine and stop nitro drip. MD aware heart rate 40-50's orders to give metoprolol.

## 2018-05-30 NOTE — Progress Notes (Signed)
ANTICOAGULATION CONSULT NOTE - Initial Consult  Pharmacy Consult for Heparin  Indication: DVT treatment (S/P limb revascularization)   No Known Allergies  Patient Measurements: Height: 5\' 6"  (167.6 cm) Weight: 168 lb 6.9 oz (76.4 kg) IBW/kg (Calculated) : 63.8 Heparin Dosing Weight: 74.7 kg   Vital Signs: Temp: 98.6 F (37 C) (04/23 0400) Temp Source: Axillary (04/23 0400) BP: 167/68 (04/23 0600) Pulse Rate: 56 (04/23 0600)  Labs: Recent Labs    05/29/18 0847  05/29/18 1327 05/29/18 1818 05/29/18 2352 05/30/18 0422  HGB  --    < > 14.0 13.6 13.7 13.4  HCT  --    < > 41.2 40.2 39.6 39.6  PLT  --    < > 240 231 233 226  APTT  --   --  109*  --   --   --   LABPROT  --   --  14.0  --   --   --   INR  --   --  1.1  --   --   --   HEPARINUNFRC  --    < > 0.31 0.10* 0.30 0.20*  CREATININE 0.90  --   --   --   --  0.64   < > = values in this interval not displayed.    Estimated Creatinine Clearance: 74.2 mL/min (by C-G formula based on SCr of 0.64 mg/dL).   Medical History: Past Medical History:  Diagnosis Date  . 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 Coronary atherosclerosis of unspecified type of vessel, native or graft   . Diabetes mellitus    Type II  . Esophageal stricture   . Gastritis   . Hiatal hernia   . Hx of colonic polyps   . Hyperlipidemia   . Hypertension   . Internal hemorrhoids without mention of complication   . Lumbago   . Pain in both lower legs   . PSA elevation    now averaging 2's  . Tobacco abuse   . URI (upper respiratory infection)   . Urinary retention     Medications:  Medications Prior to Admission  Medication Sig Dispense Refill Last Dose  . aspirin 81 MG tablet Take 81 mg by mouth daily.   05/29/2018 at Unknown time  . atorvastatin (LIPITOR) 20 MG tablet Take 1 tablet by mouth every evening for cholesterol. 90 tablet 3 05/29/2018 at Unknown time  . clopidogrel (PLAVIX) 75 MG tablet Take 1 tablet (75  mg total) by mouth daily. 30 tablet 11 Past Week at Unknown time  . finasteride (PROSCAR) 5 MG tablet Take 1 tablet (5 mg total) by mouth daily. 90 tablet 3 05/29/2018 at Unknown time  . glipiZIDE (GLUCOTROL) 10 MG tablet Take 10 mg by mouth daily before supper.    05/28/2018 at Unknown time  . glucose blood ([employer] TEST STRIPS) test strip Use one strip three times daily.   05/28/2018 at Unknown time  . insulin detemir (LEVEMIR) 100 UNIT/ML injection Inject 20 units at bedtime. (Patient taking differently: every morning. Inject 20 units at breakfast) 10 pen 5 05/28/2018 at Unknown time  . lisinopril-hydrochlorothiazide (PRINZIDE,ZESTORETIC) 20-25 MG tablet TAKE 1 TABLET BY MOUTH ONCE DAILY FOR BLOOD PRESSURE 90 tablet 1 05/29/2018 at Unknown time  . metFORMIN (GLUCOPHAGE) 500 MG tablet TAKE TWO TABLETS BY MOUTH TWICE DAILY WITH A MEAL for diabetes. 360 tablet 3 05/28/2018 at Unknown time  . metoprolol tartrate (LOPRESSOR) 50 MG tablet TAKE 1/2 (  ONE-HALF) TABLET BY MOUTH TWICE DAILY 90 tablet 1 Past Week at Unknown time  . pantoprazole (PROTONIX) 40 MG tablet Take 1 tablet (40 mg total) by mouth daily. 90 tablet 3 05/29/2018 at Unknown time  . tamsulosin (FLOMAX) 0.4 MG CAPS capsule Take two capsule daily 180 capsule 4 05/29/2018 at Unknown time    Assessment: Pharmacy has been consulted to monitor Heparin levels in this 73 yo male post- revascularization surgery.  Patient is also receiving alteplase as part of post-procedural regimen @ 1mg /hr intracatheter x 4 hours  Vascular has initiated heparin therapy with 5000 unit bolus @ 1124 followed by 800 units/hour and schedule anti-Xa levels, CBC, and Fibrinogen q6h x 4 post procedure.   Goal of Therapy:   HL target 0.2-0.5 per consult instructions  Monitor platelets by anticoagulation protocol: Yes   Plan:  04/23 @ 0422 HL 0.20 therapeutic per goal indication, but has been trending down. Will increase rate slightly to 1150 units/hr and will recheck  HL @ 1400, CBC stable will continue to monitor.  Tobie Lords, PharmD, BCPS Clinical Pharmacist 05/30/2018

## 2018-05-30 NOTE — Plan of Care (Signed)
  Problem: Clinical Measurements: Goal: Will remain free from infection Outcome: Progressing   Problem: Pain Managment: Goal: General experience of comfort will improve Outcome: Progressing   Problem: Safety: Goal: Ability to remain free from injury will improve Outcome: Progressing   Problem: Pain Managment: Goal: General experience of comfort will improve Outcome: Progressing   Problem: Cardiovascular: Goal: Vascular access site(s) Level 0-1 will be maintained Outcome: Progressing

## 2018-05-30 NOTE — Progress Notes (Signed)
Dr Lucky Cowboy notified of minimal blood drainage from insertion site to right groin (PAD intact). At this time no new orders patient is expected to have some bleeding due to Aggrastat.

## 2018-05-30 NOTE — Discharge Summary (Addendum)
[facility]    Discharge Summary  Patient ID:  Antonio [name] MRN: [id number] DOB/AGE: 1945-10-06 73 y.o.  Admit date: 05/29/2018 Discharge date: 05/31/2018 Date of Surgery: 05/30/2018 Surgeon: Surgeon(s): Algernon Huxley, MD  Admission Diagnosis: Ischemic leg [I99.8]  Discharge Diagnoses:  Ischemic leg [I99.8]  Secondary Diagnoses: Past Medical History:  Diagnosis Date  . 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 Coronary atherosclerosis of unspecified type of vessel, native or graft   . Diabetes mellitus    Type II  . Esophageal stricture   . Gastritis   . Hiatal hernia   . Hx of colonic polyps   . Hyperlipidemia   . Hypertension   . Internal hemorrhoids without mention of complication   . Lumbago   . Pain in both lower legs   . PSA elevation    now averaging 2's  . Tobacco abuse   . URI (upper respiratory infection)   . Urinary retention    Procedure(s): 05/29/18: 1. Ultrasound guidance for vascular access right femoral artery 2. Catheter placement into left common femoral artery from right femoral approach 3. Aortogram and selective left lower extremity angiogram 4. Percutaneous transluminal angioplasty of the left posterior tibial artery with 3 mm balloon 5. Percutaneous transluminal angioplasty of the left SFA and popliteal artery with 5 mm balloon             6.  Infusion of 4 mg of tpa to the left SFA and popliteal arteries using a thrombolytic catheter and leaving the catheter in place for continuous infusion 7. US guidance for access to right femoral vein with placement of a triple lumen catheter in the right femoral vein with [place] and fluro guidance  05/30/18: 1.  Left lower extremity angiogram 2. Catheter placement into left posterior tibial artery from right femoral approach 3.  Mechanical  thrombectomy to the left common femoral artery and superficial femoral arteries with the penumbra cat 6 device 4. Percutaneous transluminal angioplasty of left common femoral artery and proximal SFA with 5 mm diameter by 8 cm length Lutonix drug-coated angioplasty balloon 5.  Viabahn stent placement to the distal left SFA and proximal popliteal artery with 6 mm diameter by 7.5 cm length covered stent             6. StarClose closure device right femoral artery  Discharged Condition: Good  HPI:  The patient is a 73 year old male with multiple medical issues who presented on 05/29/18 for treatment of his severe peripheral arterial disease.  About four months ago, he underwent extensive left lower extremity revascularization for limb salvage.  He had multiple interventions performed and did well for a few months.  Over the past week or two, he has had worsening of his left lower extremity symptoms and now describes what is consistent with rest pain. His noninvasive studies suggest recurrent occlusion of his left SFA stent and a marked reduction in ABIs in the 0.5 range.  On 05/29/18, the patient underwent a left lower extremity angiogram with intervention.  He tolerated the procedure well was transferred to the ICU for Aggrastat infusion overnight.  He returned to the endovascular suite the morning of May 30, 2018 and underwent continued left lower extremity endovascular intervention.  He tolerated the second procedure without issue and was transferred back to the ICU for continued Aggrastat infusion.  The patient does have bilateral femoral disease which we will continue to follow.  If the patient's  symptoms do not improve he will need to undergo bilateral femoral endarterectomies.  He understands this.  Upon discharge, the patient's symptoms had improved, his physical exam is also improved, he was afebrile and his vital signs are stable.  The patient was tolerating regular  diet, his pain was controlled with the use of p.o. pain medication, he was urinating independently and he was ambulating without issue.  Physical exam:  A&Ox3, NAD CV: RRR Pulm: CTA Bilaterally Abdomen: Soft, Non-tender, Non-distended, (+) Bowel Sounds Groins: Access site clean, dry and intact. No drainage or swelling. Vascular:   Left Lower Extremity: Thigh soft, calf soft. Extremity is warm distally to toes. Motor / sensory is intact.  Labs as below  Complications: None  Consults: None  Significant Diagnostic Studies: CBC Lab Results  Component Value Date   WBC 10.1 05/31/2018   HGB 12.6 (L) 05/31/2018   HCT 36.9 (L) 05/31/2018   MCV 88.3 05/31/2018   PLT 223 05/31/2018   BMET    Component Value Date/Time   NA 136 05/31/2018 0529   NA 141 07/10/2014 1025   K 3.9 05/31/2018 0529   CL 103 05/31/2018 0529   CO2 26 05/31/2018 0529   GLUCOSE 195 (H) 05/31/2018 0529   BUN 17 05/31/2018 0529   BUN 12 07/10/2014 1025   CREATININE 0.91 05/31/2018 0529   CALCIUM 8.4 (L) 05/31/2018 0529   GFRNONAA >60 05/31/2018 0529   GFRAA >60 05/31/2018 0529   COAG Lab Results  Component Value Date   INR 1.1 05/29/2018   Disposition:  Discharge to :Home  Allergies as of 05/31/2018   No Known Allergies     Medication List    STOP taking these medications   clopidogrel 75 MG tablet Commonly known as:  Plavix     TAKE these medications   apixaban 5 MG Tabs tablet Commonly known as:  Eliquis Take 1 tablet (5 mg total) by mouth 2 (two) times daily.   aspirin 81 MG tablet Take 81 mg by mouth daily.   atorvastatin 20 MG tablet Commonly known as:  LIPITOR Take 1 tablet by mouth every evening for cholesterol.   finasteride 5 MG tablet Commonly known as:  PROSCAR Take 1 tablet (5 mg total) by mouth daily.   glipiZIDE 10 MG tablet Commonly known as:  GLUCOTROL Take 10 mg by mouth daily before supper.   insulin detemir 100 UNIT/ML injection Commonly known as:   Levemir Inject 20 units at bedtime. What changed:    when to take this  additional instructions   [employer] TEST STRIPS test strip Generic drug:  glucose blood Use one strip three times daily.   lisinopril-hydrochlorothiazide 20-25 MG tablet Commonly known as:  ZESTORETIC TAKE 1 TABLET BY MOUTH ONCE DAILY FOR BLOOD PRESSURE   metFORMIN 500 MG tablet Commonly known as:  GLUCOPHAGE TAKE TWO TABLETS BY MOUTH TWICE DAILY WITH A MEAL for diabetes.   metoprolol tartrate 50 MG tablet Commonly known as:  LOPRESSOR TAKE 1/2 (ONE-HALF) TABLET BY MOUTH TWICE DAILY   oxyCODONE 5 MG immediate release tablet Commonly known as:  Oxy IR/ROXICODONE One to Two Tabs Every Six Hours As Needed For Pain   pantoprazole 40 MG tablet Commonly known as:  PROTONIX Take 1 tablet (40 mg total) by mouth daily.   tamsulosin 0.4 MG Caps capsule Commonly known as:  FLOMAX Take two capsule daily      Verbal and written Discharge instructions given to the patient. Wound care per Discharge AVS Follow-up Information  Algernon Huxley, MD Follow up on 06/03/2018.   Specialties:  Vascular Surgery, Radiology, Interventional Cardiology Why:  Our office will call patient on Monday (06/03/18) to assess his symptoms. Based on symptoms will make follow up then.  Contact information: [place] [address] [phone]          Signed: Sela Hua, PA-C  05/31/2018, 10:16 AM

## 2018-05-30 NOTE — Plan of Care (Signed)
  Problem: Health Behavior/Discharge Planning: Goal: Ability to manage health-related needs will improve Outcome: Progressing   Problem: Clinical Measurements: Goal: Ability to maintain clinical measurements within normal limits will improve Outcome: Progressing Goal: Will remain free from infection Outcome: Progressing Goal: Diagnostic test results will improve Outcome: Progressing Goal: Cardiovascular complication will be avoided Outcome: Progressing   Problem: Pain Managment: Goal: General experience of comfort will improve Outcome: Progressing   Problem: Safety: Goal: Ability to remain free from injury will improve Outcome: Progressing   Problem: Education: Goal: Knowledge of General Education information will improve Description Including pain rating scale, medication(s)/side effects and non-pharmacologic comfort measures Outcome: Progressing   Problem: Health Behavior/Discharge Planning: Goal: Ability to manage health-related needs will improve Outcome: Progressing   Problem: Clinical Measurements: Goal: Ability to maintain clinical measurements within normal limits will improve Outcome: Progressing Goal: Will remain free from infection Outcome: Progressing Goal: Diagnostic test results will improve Outcome: Progressing Goal: Respiratory complications will improve Outcome: Progressing Goal: Cardiovascular complication will be avoided Outcome: Progressing   Problem: Activity: Goal: Risk for activity intolerance will decrease Outcome: Progressing   Problem: Nutrition: Goal: Adequate nutrition will be maintained Outcome: Progressing   Problem: Coping: Goal: Level of anxiety will decrease Outcome: Progressing   Problem: Elimination: Goal: Will not experience complications related to bowel motility Outcome: Progressing Goal: Will not experience complications related to urinary retention Outcome: Progressing   Problem: Pain Managment: Goal: General  experience of comfort will improve Outcome: Progressing   Problem: Safety: Goal: Ability to remain free from injury will improve Outcome: Progressing   Problem: Skin Integrity: Goal: Risk for impaired skin integrity will decrease Outcome: Progressing

## 2018-05-30 NOTE — Progress Notes (Signed)
Patient alert and oriented. No complaints of pain or shortness of breath. Patient tolerating diet, foley intact. PAD in place right groin. Minimal blood drainage from insertion site. NS and Aggrastat infusing.

## 2018-05-31 DIAGNOSIS — Z95820 Peripheral vascular angioplasty status with implants and grafts: Secondary | ICD-10-CM

## 2018-05-31 DIAGNOSIS — I739 Peripheral vascular disease, unspecified: Secondary | ICD-10-CM

## 2018-05-31 DIAGNOSIS — I998 Other disorder of circulatory system: Secondary | ICD-10-CM

## 2018-05-31 LAB — CBC
HCT: 36.9 % — ABNORMAL LOW (ref 39.0–52.0)
Hemoglobin: 12.6 g/dL — ABNORMAL LOW (ref 13.0–17.0)
MCH: 30.1 pg (ref 26.0–34.0)
MCHC: 34.1 g/dL (ref 30.0–36.0)
MCV: 88.3 fL (ref 80.0–100.0)
Platelets: 223 10*3/uL (ref 150–400)
RBC: 4.18 MIL/uL — ABNORMAL LOW (ref 4.22–5.81)
RDW: 13.3 % (ref 11.5–15.5)
WBC: 10.1 10*3/uL (ref 4.0–10.5)
nRBC: 0 % (ref 0.0–0.2)

## 2018-05-31 LAB — BASIC METABOLIC PANEL
Anion gap: 7 (ref 5–15)
BUN: 17 mg/dL (ref 8–23)
CO2: 26 mmol/L (ref 22–32)
Calcium: 8.4 mg/dL — ABNORMAL LOW (ref 8.9–10.3)
Chloride: 103 mmol/L (ref 98–111)
Creatinine, Ser: 0.91 mg/dL (ref 0.61–1.24)
GFR calc Af Amer: >60 mL/min (ref >60)
GFR calc non Af Amer: >60 mL/min (ref >60)
Glucose, Bld: 195 mg/dL — ABNORMAL HIGH (ref 70–99)
Potassium: 3.9 mmol/L (ref 3.5–5.1)
Sodium: 136 mmol/L (ref 135–145)

## 2018-05-31 LAB — MAGNESIUM: Magnesium: 1.7 mg/dL (ref 1.7–2.4)

## 2018-05-31 LAB — GLUCOSE, CAPILLARY: Glucose-Capillary: 173 mg/dL — ABNORMAL HIGH (ref 70–99)

## 2018-05-31 MED ORDER — ASPIRIN EC 81 MG PO TBEC: 81.0000 mg | DELAYED_RELEASE_TABLET | Freq: Every day | ORAL | Status: DC

## 2018-05-31 MED ORDER — APIXABAN 5 MG PO TABS: 5.0000 mg | ORAL_TABLET | Freq: Two times a day (BID) | ORAL | Status: DC

## 2018-05-31 NOTE — Discharge Instructions (Signed)
Atherosclerosis  Atherosclerosis is narrowing and hardening of the arteries. Arteries are blood vessels that carry blood from the heart to all parts of the body. This blood contains oxygen. Arteries can become narrow or clogged with a buildup of fat, cholesterol, calcium, and other substances (plaque). Plaque decreases the amount of blood that can flow through the artery. Atherosclerosis can affect any artery in the body, including:  Heart arteries (coronary artery disease). This may cause a heart attack.  Brain arteries. This may cause a stroke (cerebrovascular accident).  Leg, arm, and pelvis arteries (peripheral artery disease). This may cause pain and numbness.  Kidney arteries. This may cause kidney (renal) failure. Treatment may slow the disease and prevent further damage to the heart, brain, peripheral arteries, and kidneys. What are the causes? Atherosclerosis develops slowly over many years. The inner layers of your arteries become damaged and allow the gradual buildup of plaque. The exact cause of atherosclerosis is not fully understood. Symptoms of atherosclerosis do not occur until the artery becomes narrow or blocked. What increases the risk? The following factors may make you more likely to develop this condition:  High blood pressure.  High cholesterol.  Being middle-aged or older.  Having a family history of atherosclerosis.  Having high blood fats (triglycerides).  Diabetes.  Being overweight.  Smoking tobacco.  Not exercising enough (sedentary lifestyle).  Having a substance in the blood called C-reactive protein (CRP). This is a sign of increased levels of inflammation in the body.  Sleep apnea.  Being stressed.  Drinking too much alcohol. What are the signs or symptoms? This condition may not cause any symptoms. If you have symptoms, they are caused by damage to an area of your body that is not getting enough blood.  Coronary artery disease may  cause chest pain and shortness of breath.  Decreased blood supply to your brain may cause a stroke. Signs of a stroke may include sudden: ? Weakness on one side of the body. ? Confusion. ? Changes in vision. ? Inability to speak or understand speech. ? Loss of balance, coordination, or the ability to walk. ? Severe headache. ? Loss of consciousness.  Peripheral arterial disease may cause pain and numbness, often in the legs and hips.  Renal failure may cause fatigue, nausea, swelling, and itchy skin. How is this diagnosed? This condition is diagnosed based on your medical history and a physical exam. During the exam:  Your health care provider will: ? Check your pulse in different places. ? Listen for a "whooshing" sound over your arteries (bruit).  You may have tests, such as: ? Blood tests to check your levels of cholesterol, triglycerides, and CRP. ? Electrocardiogram (ECG) to check for heart damage. ? Chest X-ray to see if you have an enlarged heart, which is a sign of heart failure. ? Stress test to see how your heart reacts to exercise. ? Echocardiogram to get images of the inside of your heart. ? Ankle-brachial index to compare blood pressure in your arms to blood pressure in your ankles. ? Ultrasound of your peripheral arteries to check blood flow. ? CT scan to check for damage to your heart or brain. ? X-rays of blood vessels after dye has been injected (angiogram) to check blood flow. How is this treated? Treatment starts with lifestyle changes, which may include:  Changing your diet.  Losing weight.  Reducing stress.  Exercising and being physically active more regularly.  Not smoking. You may also need medicine to:  Lower triglycerides and cholesterol.  Control blood pressure.  Prevent blood clots.  Lower inflammation in your body.  Control your blood sugar. Sometimes, surgery is needed to:  Remove plaque from an artery (endarterectomy).  Open or  widen a narrowed heart artery (angioplasty).  Create a new path for your blood with one of these procedures: ? Heart (coronary) artery bypass graft surgery. ? Peripheral artery bypass graft surgery. Follow these instructions at home: Eating and drinking   Eat a heart-healthy diet. Talk with your health care provider or a diet and nutrition specialist (dietitian) if you need help. A heart-healthy diet involves: ? Limiting unhealthy fats and increasing healthy fats. Some examples of healthy fats are olive oil and canola oil. ? Eating plant-based foods, such as fruits, vegetables, nuts, whole grains, and legumes (such as peas and lentils).  Limit alcohol intake to no more than 1 drink a day for nonpregnant women and 2 drinks a day for men. One drink equals 12 oz of beer, 5 oz of wine, or 1 oz of hard liquor. Lifestyle  Follow an exercise program as told by your health care provider.  Maintain a healthy weight. Lose weight if your health care provider says that you need to do that.  Rest when you are tired.  Learn to manage your stress.  Do not use any products that contain nicotine or tobacco, such as cigarettes and e-cigarettes. If you need help quitting, ask your health care provider.  Do not abuse drugs. General instructions  Take over-the-counter and prescription medicines only as told by your health care provider.  Manage other health conditions as told by your health care provider.  Keep all follow-up visits as told by your health care provider. This is important. Contact a health care provider if:  You have chest pain or discomfort. This includes squeezing chest pain that may feel like indigestion (angina).  You have shortness of breath.  You have an irregular heartbeat.  You have unexplained fatigue.  You have unexplained pain or numbness in an arm, leg, or hip.  You have nausea, swelling of your hands or feet, and itchy skin. Get help right away if:  You have  any symptoms of a heart attack, such as: ? Chest pain. ? Shortness of breath. ? Pain in your neck, jaw, arms, back, or stomach. ? Cold sweat. ? Nausea. ? Light-headedness.  You have any symptoms of a stroke. "BE FAST" is an easy way to remember the main warning signs of a stroke: ? B - Balance. Signs are dizziness, sudden trouble walking, or loss of balance. ? E - Eyes. Signs are trouble seeing or a sudden change in vision. ? F - Face. Signs are sudden weakness or numbness of the face, or the face or eyelid drooping on one side. ? A - Arms. Signs are weakness or numbness in an arm. This happens suddenly and usually on one side of the body. ? S - Speech. Signs are sudden trouble speaking, slurred speech, or trouble understanding what people say. ? T - Time. Time to call emergency services. Write down what time symptoms started.  You have other signs of a stroke, such as: ? A sudden, severe headache with no known cause. ? Nausea or vomiting. ? Seizure. These symptoms may represent a serious problem that is an emergency. Do not wait to see if the symptoms will go away. Get medical help right away. Call your local emergency services (911 in the [place] Do not drive  yourself to the hospital. Summary  Atherosclerosis is narrowing and hardening of the arteries.  Arteries can become narrow or clogged with a buildup of fat, cholesterol, calcium, and other substances (plaque).  This condition may not cause any symptoms. If you do have symptoms, they are caused by damage to an area of your body that is not getting enough blood.  Treatment may include lifestyle changes and medicines. In some cases, surgery is needed. This information is not intended to replace advice given to you by your health care provider. Make sure you discuss any questions you have with your health care provider. Document Released: 04/15/2003 Document Revised: 05/04/2017 Document Reviewed: 09/28/2016 Elsevier Interactive  Patient Education  2019 [place] may shower as of tomorrow. Keep groins clean and dry. Prescriptions have been e-sent to your pharmacy.  Our office will be calling you on Monday to assess if any improvement in your symptoms.

## 2018-05-31 NOTE — Plan of Care (Signed)
Pt ready for discharge home.   Problem: Health Behavior/Discharge Planning: Goal: Ability to manage health-related needs will improve Outcome: Completed/Met   Problem: Clinical Measurements: Goal: Ability to maintain clinical measurements within normal limits will improve Outcome: Completed/Met Goal: Will remain free from infection Outcome: Completed/Met Goal: Diagnostic test results will improve Outcome: Completed/Met Goal: Cardiovascular complication will be avoided Outcome: Completed/Met   Problem: Pain Managment: Goal: General experience of comfort will improve Outcome: Completed/Met   Problem: Safety: Goal: Ability to remain free from injury will improve Outcome: Completed/Met   Problem: Education: Goal: Knowledge of General Education information will improve Description Including pain rating scale, medication(s)/side effects and non-pharmacologic comfort measures Outcome: Completed/Met   Problem: Health Behavior/Discharge Planning: Goal: Ability to manage health-related needs will improve Outcome: Completed/Met   Problem: Clinical Measurements: Goal: Ability to maintain clinical measurements within normal limits will improve Outcome: Completed/Met Goal: Will remain free from infection Outcome: Completed/Met Goal: Diagnostic test results will improve Outcome: Completed/Met Goal: Respiratory complications will improve Outcome: Completed/Met Goal: Cardiovascular complication will be avoided Outcome: Completed/Met   Problem: Activity: Goal: Risk for activity intolerance will decrease Outcome: Completed/Met   Problem: Nutrition: Goal: Adequate nutrition will be maintained Outcome: Completed/Met   Problem: Coping: Goal: Level of anxiety will decrease Outcome: Completed/Met   Problem: Elimination: Goal: Will not experience complications related to bowel motility Outcome: Completed/Met Goal: Will not experience complications related to urinary  retention Outcome: Completed/Met   Problem: Pain Managment: Goal: General experience of comfort will improve Outcome: Completed/Met   Problem: Safety: Goal: Ability to remain free from injury will improve Outcome: Completed/Met   Problem: Skin Integrity: Goal: Risk for impaired skin integrity will decrease Outcome: Completed/Met   Problem: Cardiovascular: Goal: Vascular access site(s) Level 0-1 will be maintained Outcome: Completed/Met

## 2018-05-31 NOTE — Progress Notes (Signed)
Notified MD of pt with irregular telemetry strip patterns. Pt with some sinus brady,with 1st degree AV block, and also 2nd degree type 1. Orders placed. Will continue to monitor and assess.

## 2018-05-31 NOTE — Progress Notes (Signed)
Discharge instructions provided to patient. All questions addressed.  No bleeding noted from right groin cath site and TLC line.  Pt voided 43mL clear yellow urine.  Escorted via w/c to POV.

## 2018-06-03 ENCOUNTER — Telehealth (INDEPENDENT_AMBULATORY_CARE_PROVIDER_SITE_OTHER): Payer: Self-pay | Admitting: Vascular Surgery

## 2018-06-03 NOTE — Telephone Encounter (Signed)
Called and spoke with patient.  Patient is doing well.  Denies any increasing pain to the bilateral legs.  States minimal left lower extremity "soreness".  States he has been afebrile.  No post procedure complications. No drainage or swelling from access site. Will bring patient back in two weeks with ABI to see Dr. Lucky Cowboy in follow up to discuss the need for a femoral endarterectomy in the future.

## 2018-06-21 ENCOUNTER — Other Ambulatory Visit (INDEPENDENT_AMBULATORY_CARE_PROVIDER_SITE_OTHER): Payer: Self-pay | Admitting: Vascular Surgery

## 2018-06-21 DIAGNOSIS — Z95820 Peripheral vascular angioplasty status with implants and grafts: Secondary | ICD-10-CM

## 2018-06-25 ENCOUNTER — Encounter (INDEPENDENT_AMBULATORY_CARE_PROVIDER_SITE_OTHER): Payer: Self-pay | Admitting: Vascular Surgery

## 2018-06-25 ENCOUNTER — Other Ambulatory Visit: Payer: Self-pay

## 2018-06-25 ENCOUNTER — Ambulatory Visit (INDEPENDENT_AMBULATORY_CARE_PROVIDER_SITE_OTHER): Payer: Medicare Other | Admitting: Vascular Surgery

## 2018-06-25 ENCOUNTER — Ambulatory Visit (INDEPENDENT_AMBULATORY_CARE_PROVIDER_SITE_OTHER): Payer: Medicare Other

## 2018-06-25 VITALS — BP 189/70 | HR 73 | Resp 16 | Wt 170.8 lb

## 2018-06-25 DIAGNOSIS — Z95820 Peripheral vascular angioplasty status with implants and grafts: Secondary | ICD-10-CM | POA: Diagnosis not present

## 2018-06-25 DIAGNOSIS — Z720 Tobacco use: Secondary | ICD-10-CM

## 2018-06-25 DIAGNOSIS — E7800 Pure hypercholesterolemia, unspecified: Secondary | ICD-10-CM | POA: Diagnosis not present

## 2018-06-25 DIAGNOSIS — Z794 Long term (current) use of insulin: Secondary | ICD-10-CM | POA: Diagnosis not present

## 2018-06-25 DIAGNOSIS — I10 Essential (primary) hypertension: Secondary | ICD-10-CM | POA: Diagnosis not present

## 2018-06-25 DIAGNOSIS — I70229 Atherosclerosis of native arteries of extremities with rest pain, unspecified extremity: Secondary | ICD-10-CM

## 2018-06-25 DIAGNOSIS — E11618 Type 2 diabetes mellitus with other diabetic arthropathy: Secondary | ICD-10-CM

## 2018-06-25 DIAGNOSIS — I739 Peripheral vascular disease, unspecified: Secondary | ICD-10-CM | POA: Diagnosis not present

## 2018-06-25 NOTE — Patient Instructions (Signed)

## 2018-06-25 NOTE — Progress Notes (Signed)
MRN : [id number]  Antonio [name] is a 73 y.o. (05-Feb-1946) male who presents with chief complaint of  Chief Complaint  Patient presents with  . Follow-up    2week ABI   .  History of Present Illness: Patient returns today in follow up of his peripheral arterial disease.  He has arthritic pain in both legs but no ischemic pain like he had before intervention last month.  This was a repeat intervention on the left leg due to acute on chronic ischemic symptoms.  He required thrombolytic therapy and thrombectomy but ultimately did okay.  He had some mild swelling which has resolved.  No access site complications.  His ABIs today are stable on the right at 0.73 and markedly improved on the left now measuring 0.96 with normal digital pressures and waveforms.  Current Outpatient Medications  Medication Sig Dispense Refill  . apixaban (ELIQUIS) 5 MG TABS tablet Take 1 tablet (5 mg total) by mouth 2 (two) times daily. 60 tablet 11  . aspirin 81 MG tablet Take 81 mg by mouth daily.    [name] atorvastatin (LIPITOR) 20 MG tablet Take 1 tablet by mouth every evening for cholesterol. 90 tablet 3  . finasteride (PROSCAR) 5 MG tablet Take 1 tablet (5 mg total) by mouth daily. 90 tablet 3  . glipiZIDE (GLUCOTROL) 10 MG tablet Take 10 mg by mouth daily before supper.     [name] glucose blood ([employer] TEST STRIPS) test strip Use one strip three times daily.    . insulin detemir (LEVEMIR) 100 UNIT/ML injection Inject 20 units at bedtime. (Patient taking differently: every morning. Inject 20 units at breakfast) 10 pen 5  . lisinopril-hydrochlorothiazide (PRINZIDE,ZESTORETIC) 20-25 MG tablet TAKE 1 TABLET BY MOUTH ONCE DAILY FOR BLOOD PRESSURE 90 tablet 1  . metFORMIN (GLUCOPHAGE) 500 MG tablet TAKE TWO TABLETS BY MOUTH TWICE DAILY WITH A MEAL for diabetes. 360 tablet 3  . metoprolol tartrate (LOPRESSOR) 50 MG tablet TAKE 1/2 (ONE-HALF) TABLET BY MOUTH TWICE DAILY 90 tablet 1  . oxyCODONE (OXY IR/ROXICODONE) 5 MG  immediate release tablet One to Two Tabs Every Six Hours As Needed For Pain 30 tablet 0  . pantoprazole (PROTONIX) 40 MG tablet Take 1 tablet (40 mg total) by mouth daily. 90 tablet 3  . tamsulosin (FLOMAX) 0.4 MG CAPS capsule Take two capsule daily 180 capsule 4   No current facility-administered medications for this visit.     Past Medical History:  Diagnosis Date  . 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 Coronary atherosclerosis of unspecified type of vessel, native or graft   . Diabetes mellitus    Type II  . Esophageal stricture   . Gastritis   . Hiatal hernia   . Hx of colonic polyps   . Hyperlipidemia   . Hypertension   . Internal hemorrhoids without mention of complication   . Lumbago   . Pain in both lower legs   . PSA elevation    now averaging 2's  . Tobacco abuse   . URI (upper respiratory infection)   . Urinary retention     Past Surgical History:  Procedure Laterality Date  . 2D Echo  06/03  . Abdominal ultrasound  06/03   Negative  . BACK SURGERY    . CT of head  and sinuses  06/03   Negative  . CT of the chest, abdomen, pelvis  06/03   Chest negative; Abdomen, left adrenal lesion;  Pelvis, enlarged prostate  . ESOPHAGOGASTRODUODENOSCOPY  02/13/07   gastritis and duodenitis without bleed  . ESOPHAGOGASTRODUODENOSCOPY N/A 06/26/2014   Procedure: ESOPHAGOGASTRODUODENOSCOPY (EGD);  Surgeon: Milus Banister, MD;  Location: [facility];  Service: Endoscopy;  Laterality: N/A;  . KNEE ARTHROSCOPY  10/00   Right  . LOWER EXTREMITY ANGIOGRAPHY Right 12/27/2017   Procedure: LOWER EXTREMITY ANGIOGRAPHY;  Surgeon: Algernon Huxley, MD;  Location: [facility];  Service: Cardiovascular;  Laterality: Right;  . LOWER EXTREMITY ANGIOGRAPHY Left 01/10/2018   Procedure: LOWER EXTREMITY ANGIOGRAPHY;  Surgeon: Algernon Huxley, MD;  Location: [facility];  Service: Cardiovascular;  Laterality: Left;  . LOWER EXTREMITY ANGIOGRAPHY Left  05/29/2018   Procedure: LOWER EXTREMITY ANGIOGRAPHY;  Surgeon: Algernon Huxley, MD;  Location: [facility];  Service: Cardiovascular;  Laterality: Left;  . LOWER EXTREMITY ANGIOGRAPHY Left 05/30/2018   Procedure: Lower Extremity Angiography;  Surgeon: Algernon Huxley, MD;  Location: [facility];  Service: Cardiovascular;  Laterality: Left;  . LP  06/03  . Microwave thermotherapy prostate  08/28/07   [place] [facility]  . Persantine cardiolite  03/01   EF 68%  . POLYPECTOMY  11/95  . Stress myoview  07/06   EF 67%    Social History Social History   Tobacco Use  . Smoking status: Current Every Day Smoker    Packs/day: 1.50    Years: 40.00    Pack years: 60.00    Types: Cigarettes  . Smokeless tobacco: Former Systems developer    Types: Chew  Substance Use Topics  . Alcohol use: Yes    Alcohol/week: 0.0 standard drinks    Comment: rare  . Drug use: No    Family History Family History  Problem Relation Age of Onset  . Diabetes Father   . Alcohol abuse Paternal Uncle   . Alcohol abuse Paternal Uncle   . Alcohol abuse Paternal Uncle   . Rheum arthritis Mother   . Heart disease Neg Hx   . Stroke Neg Hx   . Cancer Neg Hx   . Drug abuse Neg Hx   . Depression Neg Hx   . Colon cancer Neg Hx   . Rectal cancer Neg Hx   . Stomach cancer Neg Hx   . Kidney cancer Neg Hx   . Bladder Cancer Neg Hx   . Prostate cancer Neg Hx     No Known Allergies   REVIEW OF SYSTEMS (Negative unless checked)  Constitutional: [] ?Weight loss  [] ?Fever  [] ?Chills Cardiac: [] ?Chest pain   [] ?Chest pressure   [] ?Palpitations   [] ?Shortness of breath when laying flat   [] ?Shortness of breath at rest   [] ?Shortness of breath with exertion. Vascular:  [x] ?Pain in legs with walking   [] ?Pain in legs at rest   [] ?Pain in legs when laying flat   [x] ?Claudication   [x] ?Pain in feet when walking  [] ?Pain in feet at rest  [] ?Pain in feet when laying flat   [] ?History of DVT   [] ?Phlebitis   [] ?Swelling in legs    [] ?Varicose veins   [] ?Non-healing ulcers Pulmonary:   [] ?Uses home oxygen   [] ?Productive cough   [] ?Hemoptysis   [] ?Wheeze  [] ?COPD   [] ?Asthma Neurologic:  [] ?Dizziness  [] ?Blackouts   [] ?Seizures   [] ?History of stroke   [] ?History of TIA  [] ?Aphasia   [] ?Temporary blindness   [] ?Dysphagia   [] ?Weakness or numbness in arms   [] ?Weakness or numbness in legs Musculoskeletal:  [x] ?Arthritis   [] ?Joint  swelling   [x] ?Joint pain   [] ?Low back pain Hematologic:  [] ?Easy bruising  [] ?Easy bleeding   [] ?Hypercoagulable state   [] ?Anemic  [] ?Hepatitis Gastrointestinal:  [] ?Blood in stool   [] ?Vomiting blood  [] ?Gastroesophageal reflux/heartburn   [] ?Abdominal pain Genitourinary:  [] ?Chronic kidney disease   [] ?Difficult urination  [] ?Frequent urination  [] ?Burning with urination   [] ?Hematuria Skin:  [] ?Rashes   [] ?Ulcers   [] ?Wounds Psychological:  [] ?History of anxiety   [] ? History of major depression.  Physical Examination  BP (!) 189/70 (BP Location: Right Arm)   Pulse 73   Resp 16   Wt 170 lb 12.8 oz (77.5 kg)   BMI 27.57 kg/m  Gen:  WD/WN, NAD Head: [place]/AT, No temporalis wasting. Ear/Nose/Throat: Hearing grossly intact, nares w/o erythema or drainage Eyes: Conjunctiva clear. Sclera non-icteric Neck: Supple.  Trachea midline Pulmonary:  Good air movement, no use of accessory muscles.  Cardiac: RRR, no JVD Vascular:  Vessel Right Left  Radial Palpable Palpable                          PT  1+ palpable  1+ palpable  DP  1+ palpable Palpable   Gastrointestinal: soft, non-tender/non-distended. No guarding/reflex.  Musculoskeletal: M/S 5/5 throughout.  No deformity or atrophy. Trace LE edema. Neurologic: Sensation grossly intact in extremities.  Symmetrical.  Speech is fluent.  Psychiatric: Judgment intact, Mood & affect appropriate for pt's clinical situation. Dermatologic: No rashes or ulcers noted.  No cellulitis or open wounds.       Labs Recent Results (from the past  2160 hour(s))  Glucose, capillary     Status: Abnormal   Collection Time: 05/29/18  8:28 AM  Result Value Ref Range   Glucose-Capillary 143 (H) 70 - 99 mg/dL  BUN     Status: None   Collection Time: 05/29/18  8:47 AM  Result Value Ref Range   BUN 22 8 - 23 mg/dL    Comment: Performed at [facility], [address] [place], [place] [address]  Creatinine, serum     Status: None   Collection Time: 05/29/18  8:47 AM  Result Value Ref Range   Creatinine, Ser 0.90 0.61 - 1.24 mg/dL   GFR calc non Af Amer >60 >60 mL/min   GFR calc Af Amer >60 >60 mL/min    Comment: Performed at [facility], [address] [place], [place] [address]  Glucose, capillary     Status: Abnormal   Collection Time: 05/29/18 12:13 PM  Result Value Ref Range   Glucose-Capillary 148 (H) 70 - 99 mg/dL  Glucose, capillary     Status: Abnormal   Collection Time: 05/29/18  1:21 PM  Result Value Ref Range   Glucose-Capillary 136 (H) 70 - 99 mg/dL  MRSA PCR Screening     Status: Abnormal   Collection Time: 05/29/18  1:25 PM  Result Value Ref Range   MRSA by PCR POSITIVE (A) NEGATIVE    Comment:        The GeneXpert MRSA Assay (FDA approved for NASAL specimens only), is one component of a comprehensive MRSA colonization surveillance program. It is not intended to diagnose MRSA infection nor to guide or monitor treatment for MRSA infections. RESULT CALLED TO, READ BACK BY AND VERIFIED WITH: CALLED TO [employer] ELLIOT AT 1511 KLM  Performed at [facility], [address] [place], [place] [address]   Heparin level (unfractionated) every 6 hours x 4 post-procedure  Status: None   Collection Time: 05/29/18  1:27 PM  Result Value Ref Range   Heparin Unfractionated 0.31 0.30 - 0.70 IU/mL    Comment: (NOTE) If heparin results are below expected values, and patient dosage has  been confirmed, suggest follow up testing of antithrombin III levels. Performed at [facility],  [address] [place], [place] [address]   CBC every 6 hours x 4 post-procedure     Status: None   Collection Time: 05/29/18  1:27 PM  Result Value Ref Range   WBC 7.8 4.0 - 10.5 K/uL   RBC 4.67 4.22 - 5.81 MIL/uL   Hemoglobin 14.0 13.0 - 17.0 g/dL   HCT 41.2 39.0 - 52.0 %   MCV 88.2 80.0 - 100.0 fL   MCH 30.0 26.0 - 34.0 pg   MCHC 34.0 30.0 - 36.0 g/dL   RDW 13.4 11.5 - 15.5 %   Platelets 240 150 - 400 K/uL   nRBC 0.0 0.0 - 0.2 %    Comment: Performed at [facility], [address] [place], [place] [address]  Fibrinogen every 6 hours x 4 post-procedure     Status: None   Collection Time: 05/29/18  1:27 PM  Result Value Ref Range   Fibrinogen 369 210 - 475 mg/dL    Comment: Performed at [facility], [address] [place], [place] [address]  APTT     Status: Abnormal   Collection Time: 05/29/18  1:27 PM  Result Value Ref Range   aPTT 109 (H) 24 - 36 seconds    Comment:        IF BASELINE aPTT IS ELEVATED, SUGGEST PATIENT RISK ASSESSMENT BE USED TO DETERMINE APPROPRIATE ANTICOAGULANT THERAPY. Performed at [facility], [address] [place], [place] [address]   Protime-INR     Status: None   Collection Time: 05/29/18  1:27 PM  Result Value Ref Range   Prothrombin Time 14.0 11.4 - 15.2 seconds   INR 1.1 0.8 - 1.2    Comment: (NOTE) INR goal varies based on device and disease states. Performed at [facility], [address] [place], [place] [address]   Glucose, capillary     Status: Abnormal   Collection Time: 05/29/18  3:58 PM  Result Value Ref Range   Glucose-Capillary 219 (H) 70 - 99 mg/dL  Heparin level (unfractionated) every 6 hours x 4 post-procedure     Status: Abnormal   Collection Time: 05/29/18  6:18 PM  Result Value Ref Range   Heparin Unfractionated 0.10 (L) 0.30 - 0.70 IU/mL    Comment: (NOTE) If heparin results are below expected values, and patient dosage has  been confirmed, suggest follow up testing  of antithrombin III levels. Performed at [facility], [address] [place], [place] [address]   CBC every 6 hours x 4 post-procedure     Status: None   Collection Time: 05/29/18  6:18 PM  Result Value Ref Range   WBC 10.3 4.0 - 10.5 K/uL   RBC 4.55 4.22 - 5.81 MIL/uL   Hemoglobin 13.6 13.0 - 17.0 g/dL   HCT 40.2 39.0 - 52.0 %   MCV 88.4 80.0 - 100.0 fL   MCH 29.9 26.0 - 34.0 pg   MCHC 33.8 30.0 - 36.0 g/dL   RDW 13.5 11.5 - 15.5 %   Platelets 231 150 - 400 K/uL   nRBC 0.0 0.0 - 0.2 %    Comment: Performed at [facility],  [place], [place] [address]  Fibrinogen every 6 hours x 4 post-procedure     Status: None   Collection Time: 05/29/18  6:18 PM  Result Value Ref Range   Fibrinogen 382 210 - 475 mg/dL    Comment: Performed at [facility], [address] [place], [place] [address]  Glucose, capillary     Status: Abnormal   Collection Time: 05/29/18  9:34 PM  Result Value Ref Range   Glucose-Capillary 136 (H) 70 - 99 mg/dL   Comment 1 Notify RN   Heparin level (unfractionated) every 6 hours x 4 post-procedure     Status: None   Collection Time: 05/29/18 11:52 PM  Result Value Ref Range   Heparin Unfractionated 0.30 0.30 - 0.70 IU/mL    Comment: (NOTE) If heparin results are below expected values, and patient dosage has  been confirmed, suggest follow up testing of antithrombin III levels. Performed at [facility], [address] [place], [place] [address]   CBC every 6 hours x 4 post-procedure     Status: Abnormal   Collection Time: 05/29/18 11:52 PM  Result Value Ref Range   WBC 12.6 (H) 4.0 - 10.5 K/uL   RBC 4.57 4.22 - 5.81 MIL/uL   Hemoglobin 13.7 13.0 - 17.0 g/dL   HCT 39.6 39.0 - 52.0 %   MCV 86.7 80.0 - 100.0 fL   MCH 30.0 26.0 - 34.0 pg   MCHC 34.6 30.0 - 36.0 g/dL   RDW 13.3 11.5 - 15.5 %   Platelets 233 150 - 400 K/uL   nRBC 0.0 0.0 - 0.2 %    Comment: Performed at [facility], [address] [place], [place] [address]  Fibrinogen every 6 hours x 4 post-procedure     Status: None   Collection Time: 05/29/18 11:52 PM  Result Value Ref Range   Fibrinogen 382 210 - 475 mg/dL    Comment: Performed at [facility], [address] [place], [place] [address]  Glucose, capillary     Status: Abnormal   Collection Time: 05/30/18  3:57 AM  Result Value Ref Range   Glucose-Capillary 162 (H) 70 - 99 mg/dL   Comment 1 Notify RN   CBC     Status: None   Collection Time: 05/30/18  4:22 AM  Result Value Ref Range   WBC 10.5 4.0 - 10.5 K/uL   RBC 4.52 4.22 - 5.81 MIL/uL   Hemoglobin 13.4 13.0 - 17.0 g/dL   HCT 39.6 39.0 - 52.0 %   MCV 87.6 80.0 - 100.0 fL   MCH 29.6 26.0 - 34.0 pg   MCHC 33.8 30.0 - 36.0 g/dL   RDW 13.3 11.5 - 15.5 %   Platelets 226 150 - 400 K/uL   nRBC 0.0 0.0 - 0.2 %    Comment: Performed at [facility], [address] [place], [place] [address]  Basic metabolic panel     Status: Abnormal   Collection Time: 05/30/18  4:22 AM  Result Value Ref Range   Sodium 137 135 - 145 mmol/L   Potassium 3.9 3.5 - 5.1 mmol/L   Chloride 104 98 - 111 mmol/L   CO2 25 22 - 32 mmol/L   Glucose, Bld 160 (H) 70 - 99 mg/dL   BUN 17 8 - 23 mg/dL   Creatinine, Ser 0.64 0.61 - 1.24 mg/dL   Calcium 8.4 (L) 8.9 - 10.3 mg/dL   GFR calc non Af Amer >  60 >60 mL/min   GFR calc Af Amer >60 >60 mL/min   Anion gap 8 5 - 15    Comment: Performed at [facility], [address] [place], [place] [address]  Magnesium     Status: None   Collection Time: 05/30/18  4:22 AM  Result Value Ref Range   Magnesium 1.8 1.7 - 2.4 mg/dL    Comment: Performed at [facility], [address] [place], [place] [address]  Fibrinogen every 6 hours x 4 post-procedure     Status: None   Collection Time: 05/30/18  4:22 AM  Result Value Ref Range   Fibrinogen 369 210 - 475 mg/dL    Comment: Performed at [facility], [place], [place] [address]  Heparin level (unfractionated)     Status: Abnormal   Collection Time: 05/30/18  4:22 AM  Result Value Ref Range   Heparin Unfractionated 0.20 (L) 0.30 - 0.70 IU/mL    Comment: (NOTE) If heparin results are below expected values, and patient dosage has  been confirmed, suggest follow up testing of antithrombin III levels. Performed at [facility], [address] [place], [place] [address]   Glucose, capillary     Status: Abnormal   Collection Time: 05/30/18  7:24 AM  Result Value Ref Range   Glucose-Capillary 166 (H) 70 - 99 mg/dL  Glucose, capillary     Status: Abnormal   Collection Time: 05/30/18  9:05 AM  Result Value Ref Range   Glucose-Capillary 154 (H) 70 - 99 mg/dL  Glucose, capillary     Status: Abnormal   Collection Time: 05/30/18 12:27 PM  Result Value Ref Range   Glucose-Capillary 196 (H) 70 - 99 mg/dL  Glucose, capillary     Status: Abnormal   Collection Time: 05/30/18  5:22 PM  Result Value Ref Range   Glucose-Capillary 138 (H) 70 - 99 mg/dL  Glucose, capillary     Status: Abnormal   Collection Time: 05/30/18  8:49 PM  Result Value Ref Range   Glucose-Capillary 203 (H) 70 - 99 mg/dL   Comment 1 Notify RN   CBC     Status: Abnormal   Collection Time: 05/31/18  5:29 AM  Result Value Ref Range   WBC 10.1 4.0 - 10.5 K/uL   RBC 4.18 (L) 4.22 - 5.81 MIL/uL   Hemoglobin 12.6 (L) 13.0 - 17.0 g/dL   HCT 36.9 (L) 39.0 - 52.0 %   MCV 88.3 80.0 - 100.0 fL   MCH 30.1 26.0 - 34.0 pg   MCHC 34.1 30.0 - 36.0 g/dL   RDW 13.3 11.5 - 15.5 %   Platelets 223 150 - 400 K/uL   nRBC 0.0 0.0 - 0.2 %    Comment: Performed at [facility], [address] [place], [place] [address]  Basic metabolic panel     Status: Abnormal   Collection Time: 05/31/18  5:29 AM  Result Value Ref Range   Sodium 136 135 - 145 mmol/L   Potassium 3.9 3.5 - 5.1 mmol/L   Chloride 103 98 - 111 mmol/L   CO2 26 22 - 32 mmol/L   Glucose, Bld 195 (H) 70 - 99  mg/dL   BUN 17 8 - 23 mg/dL   Creatinine, Ser 0.91 0.61 - 1.24 mg/dL   Calcium 8.4 (L) 8.9 - 10.3 mg/dL   GFR calc non Af Amer >60 >60 mL/min   GFR calc Af Amer >60 >60 mL/min  Anion gap 7 5 - 15    Comment: Performed at [facility], [address] [place], [place] [address]  Magnesium     Status: None   Collection Time: 05/31/18  5:29 AM  Result Value Ref Range   Magnesium 1.7 1.7 - 2.4 mg/dL    Comment: Performed at [facility], [address] [place], [place] [address]  Glucose, capillary     Status: Abnormal   Collection Time: 05/31/18  7:35 AM  Result Value Ref Range   Glucose-Capillary 173 (H) 70 - 99 mg/dL    Radiology No results found.  Assessment/Plan Essential hypertension blood pressure control important in reducing the progression of atherosclerotic disease. On appropriate oral medications.   Diabetes mellitus type 2, controlled blood glucose control important in reducing the progression of atherosclerotic disease. Also, involved in wound healing. On appropriate medications.   Tobacco abuse We had a discussion for approximately 3 minutes regarding the absolute need for smoking cessation due to the deleterious nature of tobacco on the vascular system. We discussed the tobacco use would diminish patency of any intervention, and likely significantly worsen progressio of disease. We discussed multiple agents for quitting including replacement therapy or medications to reduce cravings such as Chantix. The patient voices their understanding of the importance of smoking cessation.  He has cut well back on his tobacco and is congratulated for this.  He is gone from 2 packs/day down to less than 1 pack/day.  He is hoping to be off tobacco sometime next month.   HLD (hyperlipidemia) lipid control important in reducing the progression of atherosclerotic disease. Continue statin therapy  PAD (peripheral artery disease) ([place]) His ABIs today are  stable on the right at 0.73 and markedly improved on the left now measuring 0.96 with normal digital pressures and waveforms.  His symptoms are markedly improved as well.  We will continue the current medical regimen.  He is doing a good job on cutting back on his tobacco use and is trying to stop and is congratulated for this.  He understands the importance of smoking cessation.  I will plan to see him back in 6 months with noninvasive studies or sooner if problems develop in the interim    Leotis Pain, MD  06/25/2018 3:58 PM    This note was created with Dragon medical transcription system.  Any errors from dictation are purely unintentional

## 2018-06-25 NOTE — Assessment & Plan Note (Signed)
His ABIs today are stable on the right at 0.73 and markedly improved on the left now measuring 0.96 with normal digital pressures and waveforms.  His symptoms are markedly improved as well.  We will continue the current medical regimen.  He is doing a good job on cutting back on his tobacco use and is trying to stop and is congratulated for this.  He understands the importance of smoking cessation.  I will plan to see him back in 6 months with noninvasive studies or sooner if problems develop in the interim

## 2018-07-24 DIAGNOSIS — E1142 Type 2 diabetes mellitus with diabetic polyneuropathy: Secondary | ICD-10-CM | POA: Diagnosis not present

## 2018-07-24 DIAGNOSIS — E538 Deficiency of other specified B group vitamins: Secondary | ICD-10-CM | POA: Diagnosis not present

## 2018-07-31 DIAGNOSIS — I10 Essential (primary) hypertension: Secondary | ICD-10-CM | POA: Diagnosis not present

## 2018-07-31 DIAGNOSIS — R809 Proteinuria, unspecified: Secondary | ICD-10-CM | POA: Diagnosis not present

## 2018-07-31 DIAGNOSIS — E1142 Type 2 diabetes mellitus with diabetic polyneuropathy: Secondary | ICD-10-CM | POA: Diagnosis not present

## 2018-07-31 DIAGNOSIS — E538 Deficiency of other specified B group vitamins: Secondary | ICD-10-CM | POA: Diagnosis not present

## 2018-07-31 DIAGNOSIS — Z794 Long term (current) use of insulin: Secondary | ICD-10-CM | POA: Diagnosis not present

## 2018-07-31 DIAGNOSIS — F17200 Nicotine dependence, unspecified, uncomplicated: Secondary | ICD-10-CM | POA: Diagnosis not present

## 2018-07-31 DIAGNOSIS — E1129 Type 2 diabetes mellitus with other diabetic kidney complication: Secondary | ICD-10-CM | POA: Diagnosis not present

## 2018-07-31 DIAGNOSIS — E1159 Type 2 diabetes mellitus with other circulatory complications: Secondary | ICD-10-CM | POA: Diagnosis not present

## 2018-08-09 NOTE — Progress Notes (Signed)
PCP notes:   Health maintenance:  A1C - completed Colonoscopy - to be determined  Abnormal screenings:   None  Patient concerns:   None  Nurse concerns:  None  Next PCP appt:   09/17/17

## 2018-08-09 NOTE — Progress Notes (Signed)
Subjective:   Antonio [name] is a 73 y.o. male who presents for Medicare Annual/Subsequent preventive examination.  Review of Systems:  N/A Cardiac Risk Factors include: advanced age (>73men, >61 women);diabetes mellitus;dyslipidemia;hypertension;male gender;smoking/ tobacco exposure     Objective:    Vitals: BP (!) 150/70 (BP Location: Right Arm, Patient Position: Sitting, Cuff Size: Normal)   Pulse (!) 47   Temp 97.7 F (36.5 C) (Oral)   Ht 5' 6.75" (1.695 m) Comment: shoes  Wt 168 lb 8 oz (76.4 kg)   SpO2 97%   BMI 26.59 kg/m   Body mass index is 26.59 kg/m.  Advanced Directives 05/29/2018 01/10/2018 12/27/2017 09/14/2017 07/12/2016 08/05/2014 06/27/2014  Does Patient Have a Medical Advance Directive? Yes Yes No Yes Yes Yes No  Type of Advance Directive Living will - - [place];Living will [place];Living will [facility] -  Does patient want to make changes to medical advance directive? No - Patient declined No - Patient declined - - - - -  Copy of Press photographer in Chart? - - - Yes No - copy requested - -  Would patient like information on creating a medical advance directive? - - No - Patient declined - - - Yes - Scientist, clinical (histocompatibility and immunogenetics) given    Tobacco Social History   Tobacco Use  Smoking Status Current Every Day Smoker  . Packs/day: 1.50  . Years: 40.00  . Pack years: 60.00  . Types: Cigarettes  Smokeless Tobacco Former Systems developer  . Types: Chew     Ready to quit: No Counseling given: No   Clinical Intake:  Pre-visit preparation completed: Yes  Pain : No/denies pain Pain Score: 0-No pain     Nutritional Status: BMI 25 -29 Overweight Nutritional Risks: None Diabetes: Yes CBG done?: No Did pt. bring in CBG monitor from home?: No  How often do you need to have someone help you when you read instructions, pamphlets, or other written materials from your doctor or pharmacy?: 1 - Never What is the  last grade level you completed in school?: 11th grade  Interpreter Needed?: No  Comments: pt lives with spouse Information entered by :: LPinson, LPN  Past Medical History:  Diagnosis Date  . 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 Coronary atherosclerosis of unspecified type of vessel, native or graft   . Diabetes mellitus    Type II  . Esophageal stricture   . Gastritis   . Hiatal hernia   . Hx of colonic polyps   . Hyperlipidemia   . Hypertension   . Internal hemorrhoids without mention of complication   . Lumbago   . Pain in both lower legs   . PSA elevation    now averaging 2's  . Tobacco abuse   . URI (upper respiratory infection)   . Urinary retention    Past Surgical History:  Procedure Laterality Date  . 2D Echo  06/03  . Abdominal ultrasound  06/03   Negative  . BACK SURGERY    . CT of head  and sinuses  06/03   Negative  . CT of the chest, abdomen, pelvis  06/03   Chest negative; Abdomen, left adrenal lesion; Pelvis, enlarged prostate  . ESOPHAGOGASTRODUODENOSCOPY  02/13/07   gastritis and duodenitis without bleed  . ESOPHAGOGASTRODUODENOSCOPY N/A 06/26/2014   Procedure: ESOPHAGOGASTRODUODENOSCOPY (EGD);  Surgeon: Milus Banister, MD;  Location: [facility];  Service: Endoscopy;  Laterality:  N/A;  . KNEE ARTHROSCOPY  10/00   Right  . LOWER EXTREMITY ANGIOGRAPHY Right 12/27/2017   Procedure: LOWER EXTREMITY ANGIOGRAPHY;  Surgeon: Algernon Huxley, MD;  Location: [facility];  Service: Cardiovascular;  Laterality: Right;  . LOWER EXTREMITY ANGIOGRAPHY Left 01/10/2018   Procedure: LOWER EXTREMITY ANGIOGRAPHY;  Surgeon: Algernon Huxley, MD;  Location: [facility];  Service: Cardiovascular;  Laterality: Left;  . LOWER EXTREMITY ANGIOGRAPHY Left 05/29/2018   Procedure: LOWER EXTREMITY ANGIOGRAPHY;  Surgeon: Algernon Huxley, MD;  Location: [facility];  Service: Cardiovascular;  Laterality: Left;  . LOWER EXTREMITY  ANGIOGRAPHY Left 05/30/2018   Procedure: Lower Extremity Angiography;  Surgeon: Algernon Huxley, MD;  Location: [facility];  Service: Cardiovascular;  Laterality: Left;  . LP  06/03  . Microwave thermotherapy prostate  08/28/07   [facility]ers Blocker  . Persantine cardiolite  03/01   EF 68%  . POLYPECTOMY  11/95  . Stress myoview  07/06   EF 67%   Family History  Problem Relation Age of Onset  . Diabetes Father   . Alcohol abuse Paternal Uncle   . Alcohol abuse Paternal Uncle   . Alcohol abuse Paternal Uncle   . Rheum arthritis Mother   . Heart disease Neg Hx   . Stroke Neg Hx   . Cancer Neg Hx   . Drug abuse Neg Hx   . Depression Neg Hx   . Colon cancer Neg Hx   . Rectal cancer Neg Hx   . Stomach cancer Neg Hx   . Kidney cancer Neg Hx   . Bladder Cancer Neg Hx   . Prostate cancer Neg Hx    Social History   Socioeconomic History  . Marital status: Married    Spouse name: Antonio [name]  . Number of children: 3  . Years of education: Not on file  . Highest education level: Not on file  Occupational History  . Occupation: Retired Animal nutritionist  . Financial resource strain: Not hard at all  . Food insecurity    Worry: Never true    Inability: Never true  . Transportation needs    Medical: No    Non-medical: No  Tobacco Use  . Smoking status: Current Every Day Smoker    Packs/day: 1.50    Years: 40.00    Pack years: 60.00    Types: Cigarettes  . Smokeless tobacco: Former Systems developer    Types: Chew  Substance and Sexual Activity  . Alcohol use: Yes    Alcohol/week: 0.0 standard drinks    Comment: rare  . Drug use: No  . Sexual activity: Not on file  Lifestyle  . Physical activity    Days per week: 0 days    Minutes per session: Not on file  . Stress: To some extent  Relationships  . Social connections    Talks on phone: More than three times a week    Gets together: Three times a week    Attends religious service: Never    Active member of club or organization: Not  on file    Attends meetings of clubs or organizations: Not on file    Relationship status: Married  Other Topics Concern  . Not on file  Social History Narrative   Married 9/09 wife with moderate memory problems.   3 adult children, 2 grandchildren   Would desire CPR    Outpatient Encounter Medications as of 09/14/2017  Medication Sig  .  aspirin 81 MG tablet Take 81 mg by mouth daily.  [name] glipiZIDE (GLUCOTROL) 10 MG tablet Take 10 mg by mouth daily before supper.   [name] glucose blood ([employer] TEST STRIPS) test strip Use one strip three times daily.  . insulin detemir (LEVEMIR) 100 UNIT/ML injection Inject 20 units at bedtime. (Patient taking differently: every morning. Inject 20 units at breakfast)  . [DISCONTINUED] atorvastatin (LIPITOR) 20 MG tablet Take 1 tablet by mouth every evening for cholesterol.  . [DISCONTINUED] finasteride (PROSCAR) 5 MG tablet TAKE ONE TABLET BY MOUTH ONCE DAILY  . [DISCONTINUED] metFORMIN (GLUCOPHAGE) 500 MG tablet TAKE TWO TABLETS BY MOUTH TWICE DAILY WITH A MEAL  . [DISCONTINUED] metoprolol tartrate (LOPRESSOR) 50 MG tablet TAKE 1/2 (ONE-HALF) TABLET BY MOUTH TWICE DAILY  . [DISCONTINUED] pantoprazole (PROTONIX) 40 MG tablet Take 1 tablet (40 mg total) by mouth daily.  . [DISCONTINUED] ramipril (ALTACE) 10 MG capsule Take 1 tablet by mouth once daily for blood pressure.  . [DISCONTINUED] tamsulosin (FLOMAX) 0.4 MG CAPS capsule TAKE TWO CAPSULES BY MOUTH ONCE DAILY   No facility-administered encounter medications on file as of 09/14/2017.     Activities of Daily Living In your present state of health, do you have any difficulty performing the following activities: 05/29/2018 12/25/2017  Hearing? N Y  Comment - HOH  Vision? N N  Difficulty concentrating or making decisions? N N  Walking or climbing stairs? N Y  Dressing or bathing? N N  Doing errands, shopping? N -  Preparing Food and eating ? - -  Using the Toilet? - -  In the past six months, have you  accidently leaked urine? - -  Do you have problems with loss of bowel control? - -  Managing your Medications? - -  Managing your Finances? - -  Housekeeping or managing your Housekeeping? - -  Some recent data might be hidden    Patient Care Team: Pleas Koch, NP as PCP - General (Internal Medicine) Nori Riis PA-C as Physician Assistant (Urology) Ladene Artist, MD as Consulting Physician (Gastroenterology)   Assessment:   This is a routine wellness examination for Gavyn.  Hearing Screening Comments: Bilateral hearing aids Vision Screening Comments: July 2019 with Dr. George Ina  Exercise Activities and Dietary recommendations Current Exercise Habits: The patient has a physically strenous job, but has no regular exercise apart from work.(works maintenance PT 3 hours/day), Exercise limited by: None identified  Goals    . Increase water intake     Starting 09/14/2017, I will continue to drink at least 6-8 glasses of water daily.        Fall Risk Fall Risk  09/14/2017 08/28/2016 07/12/2016 04/20/2015  Falls in the past year? Yes No No Yes  Comment pt tripped and fell; pt fell while trying to help wife get up Emmi Telephone Survey: data to providers prior to load - -  Number falls in past yr: 2 or more - - 2 or more  Injury with Fall? Yes - - No  Risk for fall due to : History of fall(s) - - -   Depression Screen PHQ 2/9 Scores 09/14/2017 07/12/2016 04/20/2015  PHQ - 2 Score 0 0 0  PHQ- 9 Score 0 - -    Cognitive Function MMSE - Mini Mental State Exam 09/14/2017 07/12/2016  Orientation to time 5 5  Orientation to Place 5 5  Registration 3 3  Attention/ Calculation 0 0  Recall 3 3  Language- name 2 objects 0  0  Language- repeat 1 1  Language- follow 3 step command 3 3  Language- read & follow direction 0 0  Write a sentence 0 0  Copy design 0 0  Total score 20 20        Immunization History  Administered Date(s) Administered  . Td 10/08/1994, 11/19/2006     Screening Tests Health Maintenance  Topic Date Due  . COLONOSCOPY  08/04/2017  . HEMOGLOBIN A1C  03/17/2018  . TETANUS/TDAP  09/15/2018 (Originally 11/18/2016)  . PNA vac Low Risk Adult (1 of 2 - PCV13) 09/15/2018 (Originally 12/24/2009)  . INFLUENZA VACCINE  01/21/2057 (Originally 09/07/2018)  . OPHTHALMOLOGY EXAM  09/01/2018  . FOOT EXAM  05/16/2019  . Hepatitis C Screening  Completed        Plan:    I have personally reviewed, addressed, and noted the following in the patient's chart:  A. Medical and social history B. Use of alcohol, tobacco or illicit drugs  C. Current medications and supplements D. Functional ability and status E.  Nutritional status F.  Physical activity G. Advance directives H. List of other physicians I.  Hospitalizations, surgeries, and ER visits in previous 12 months J.  Vitals (unless it is a telemedicine encounter) K. Screenings to include cognitive, depression, hearing, vision (NOTE: hearing and vision screenings not completed in telemedicine encounter) L. Referrals and appointments   In addition, I have reviewed and discussed with patient certain preventive protocols, quality metrics, and best practice recommendations. A written personalized care plan for preventive services and recommendations were provided to patient.  Signed,   Lindell Noe, MHA, BS, LPN Health Coach

## 2018-08-12 NOTE — Progress Notes (Signed)
I reviewed health advisor's note, was available for consultation, and agree with documentation and plan.  

## 2018-08-26 ENCOUNTER — Other Ambulatory Visit: Payer: Self-pay | Admitting: Primary Care

## 2018-08-26 DIAGNOSIS — I10 Essential (primary) hypertension: Secondary | ICD-10-CM

## 2018-09-17 ENCOUNTER — Other Ambulatory Visit: Payer: Self-pay | Admitting: Primary Care

## 2018-09-17 DIAGNOSIS — N138 Other obstructive and reflux uropathy: Secondary | ICD-10-CM

## 2018-09-17 DIAGNOSIS — E7800 Pure hypercholesterolemia, unspecified: Secondary | ICD-10-CM

## 2018-09-17 DIAGNOSIS — I10 Essential (primary) hypertension: Secondary | ICD-10-CM

## 2018-09-17 DIAGNOSIS — E11618 Type 2 diabetes mellitus with other diabetic arthropathy: Secondary | ICD-10-CM

## 2018-09-23 ENCOUNTER — Ambulatory Visit (INDEPENDENT_AMBULATORY_CARE_PROVIDER_SITE_OTHER): Payer: Medicare Other

## 2018-09-23 ENCOUNTER — Ambulatory Visit: Payer: Medicare Other

## 2018-09-23 ENCOUNTER — Other Ambulatory Visit: Payer: Self-pay

## 2018-09-23 VITALS — Ht 66.0 in | Wt 168.0 lb

## 2018-09-23 DIAGNOSIS — Z0000 Encounter for general adult medical examination without abnormal findings: Secondary | ICD-10-CM | POA: Diagnosis not present

## 2018-09-23 NOTE — Patient Instructions (Signed)
Antonio [name] , Thank you for taking time to come for your Medicare Wellness Visit. I appreciate your ongoing commitment to your health goals. Please review the following plan we discussed and let me know if I can assist you in the future.   Screening recommendations/referrals: Colonoscopy: due Recommended yearly ophthalmology/optometry visit for glaucoma screening and checkup Recommended yearly dental visit for hygiene and checkup  Vaccinations: Influenza vaccine: declines Pneumococcal vaccine: declines Tdap vaccine: decline Shingles vaccine: discussed    Advanced directives: Please bring a copy of your POA (Power of Attorney) and/or Living Will to your next appointment.    Conditions/risks identified: overweight, smoking  Next appointment: 09/30/2018 at 9:00  Preventive Care 74 Years and Older, Male Preventive care refers to lifestyle choices and visits with your health care provider that can promote health and wellness. What does preventive care include?  A yearly physical exam. This is also called an annual well check.  Dental exams once or twice a year.  Routine eye exams. Ask your health care provider how often you should have your eyes checked.  Personal lifestyle choices, including:  Daily care of your teeth and gums.  Regular physical activity.  Eating a healthy diet.  Avoiding tobacco and drug use.  Limiting alcohol use.  Practicing safe sex.  Taking low doses of aspirin every day.  Taking vitamin and mineral supplements as recommended by your health care provider. What happens during an annual well check? The services and screenings done by your health care provider during your annual well check will depend on your age, overall health, lifestyle risk factors, and family history of disease. Counseling  Your health care provider may ask you questions about your:  Alcohol use.  Tobacco use.  Drug use.  Emotional well-being.  Home and relationship  well-being.  Sexual activity.  Eating habits.  History of falls.  Memory and ability to understand (cognition).  Work and work Statistician. Screening  You may have the following tests or measurements:  Height, weight, and BMI.  Blood pressure.  Lipid and cholesterol levels. These may be checked every 5 years, or more frequently if you are over 47 years old.  Skin check.  Lung cancer screening. You may have this screening every year starting at age 57 if you have a 30-pack-year history of smoking and currently smoke or have quit within the past 15 years.  Fecal occult blood test (FOBT) of the stool. You may have this test every year starting at age 61.  Flexible sigmoidoscopy or colonoscopy. You may have a sigmoidoscopy every 5 years or a colonoscopy every 10 years starting at age 68.  Prostate cancer screening. Recommendations will vary depending on your family history and other risks.  Hepatitis C blood test.  Hepatitis B blood test.  Sexually transmitted disease (STD) testing.  Diabetes screening. This is done by checking your blood sugar (glucose) after you have not eaten for a while (fasting). You may have this done every 1-3 years.  Abdominal aortic aneurysm (AAA) screening. You may need this if you are a current or former smoker.  Osteoporosis. You may be screened starting at age 37 if you are at high risk. Talk with your health care provider about your test results, treatment options, and if necessary, the need for more tests. Vaccines  Your health care provider may recommend certain vaccines, such as:  Influenza vaccine. This is recommended every year.  Tetanus, diphtheria, and acellular pertussis (Tdap, Td) vaccine. You may need a Td booster  every 10 years.  Zoster vaccine. You may need this after age 86.  Pneumococcal 13-valent conjugate (PCV13) vaccine. One dose is recommended after age 61.  Pneumococcal polysaccharide (PPSV23) vaccine. One dose is  recommended after age 47. Talk to your health care provider about which screenings and vaccines you need and how often you need them. This information is not intended to replace advice given to you by your health care provider. Make sure you discuss any questions you have with your health care provider. Document Released: 02/19/2015 Document Revised: 10/13/2015 Document Reviewed: 11/24/2014 Elsevier Interactive Patient Education  2017 [employer] Prevention in the Home Falls can cause injuries. They can happen to people of all ages. There are many things you can do to make your home safe and to help prevent falls. What can I do on the outside of my home?  Regularly fix the edges of walkways and driveways and fix any cracks.  Remove anything that might make you trip as you walk through a door, such as a raised step or threshold.  Trim any bushes or trees on the path to your home.  Use bright outdoor lighting.  Clear any walking paths of anything that might make someone trip, such as rocks or tools.  Regularly check to see if handrails are loose or broken. Make sure that both sides of any steps have handrails.  Any raised decks and porches should have guardrails on the edges.  Have any leaves, snow, or ice cleared regularly.  Use sand or salt on walking paths during winter.  Clean up any spills in your garage right away. This includes oil or grease spills. What can I do in the bathroom?  Use night lights.  Install grab bars by the toilet and in the tub and shower. Do not use towel bars as grab bars.  Use non-skid mats or decals in the tub or shower.  If you need to sit down in the shower, use a plastic, non-slip stool.  Keep the floor dry. Clean up any water that spills on the floor as soon as it happens.  Remove soap buildup in the tub or shower regularly.  Attach bath mats securely with double-sided non-slip rug tape.  Do not have throw rugs and other things on  the floor that can make you trip. What can I do in the bedroom?  Use night lights.  Make sure that you have a light by your bed that is easy to reach.  Do not use any sheets or blankets that are too big for your bed. They should not hang down onto the floor.  Have a firm chair that has side arms. You can use this for support while you get dressed.  Do not have throw rugs and other things on the floor that can make you trip. What can I do in the kitchen?  Clean up any spills right away.  Avoid walking on wet floors.  Keep items that you use a lot in easy-to-reach places.  If you need to reach something above you, use a strong step stool that has a grab bar.  Keep electrical cords out of the way.  Do not use floor polish or wax that makes floors slippery. If you must use wax, use non-skid floor wax.  Do not have throw rugs and other things on the floor that can make you trip. What can I do with my stairs?  Do not leave any items on the stairs.  Make  sure that there are handrails on both sides of the stairs and use them. Fix handrails that are broken or loose. Make sure that handrails are as long as the stairways.  Check any carpeting to make sure that it is firmly attached to the stairs. Fix any carpet that is loose or worn.  Avoid having throw rugs at the top or bottom of the stairs. If you do have throw rugs, attach them to the floor with carpet tape.  Make sure that you have a light switch at the top of the stairs and the bottom of the stairs. If you do not have them, ask someone to add them for you. What else can I do to help prevent falls?  Wear shoes that:  Do not have high heels.  Have rubber bottoms.  Are comfortable and fit you well.  Are closed at the toe. Do not wear sandals.  If you use a stepladder:  Make sure that it is fully opened. Do not climb a closed stepladder.  Make sure that both sides of the stepladder are locked into place.  Ask someone to  hold it for you, if possible.  Clearly mark and make sure that you can see:  Any grab bars or handrails.  First and last steps.  Where the edge of each step is.  Use tools that help you move around (mobility aids) if they are needed. These include:  Canes.  Walkers.  Scooters.  Crutches.  Turn on the lights when you go into a dark area. Replace any light bulbs as soon as they burn out.  Set up your furniture so you have a clear path. Avoid moving your furniture around.  If any of your floors are uneven, fix them.  If there are any pets around you, be aware of where they are.  Review your medicines with your doctor. Some medicines can make you feel dizzy. This can increase your chance of falling. Ask your doctor what other things that you can do to help prevent falls. This information is not intended to replace advice given to you by your health care provider. Make sure you discuss any questions you have with your health care provider. Document Released: 11/19/2008 Document Revised: 07/01/2015 Document Reviewed: 02/27/2014 Elsevier Interactive Patient Education  2017 [employer].

## 2018-09-23 NOTE — Progress Notes (Addendum)
Subjective:   Antonio [name] is a 73 y.o. male who presents for Medicare Annual/Subsequent preventive examination.  This visit type was conducted due to national recommendations for restrictions regarding the COVID-19 Pandemic (e.g. social distancing). This format is felt to be most appropriate for this patient at this time. All issues noted in this document were discussed and addressed. No physical exam was performed (except for noted visual exam findings with Video Visits). This patient, Antonio [name], has given permission to perform this visit via telephone. Vital signs may be absent or patient reported.  Patient location:  At home  Nurse location:  At home     Review of Systems:  n/a Cardiac Risk Factors include: advanced age (>89men, >17 women);hypertension;diabetes mellitus;dyslipidemia;male gender;smoking/ tobacco exposure     Objective:    Vitals: Ht 5\' 6"  (1.676 m) Comment: per patient  Wt 168 lb (76.2 kg) Comment: per patient  BMI 27.12 kg/m   Body mass index is 27.12 kg/m.  Advanced Directives 09/23/2018 05/29/2018 01/10/2018 12/27/2017 09/14/2017 07/12/2016 08/05/2014  Does Patient Have a Medical Advance Directive? Yes Yes Yes No Yes Yes Yes  Type of Academic librarian Living will [place];Living will [place];Living will [place]  Does patient want to make changes to medical advance directive? - No - Patient declined No - Patient declined - - - -  Copy of [place] in Chart? No - copy requested - - - Yes No - copy requested -  Would patient like information on creating a medical advance directive? - - - No - Patient declined - - -    Tobacco Social History   Tobacco Use  Smoking Status Current Every Day Smoker  . Packs/day: 1.50  . Years: 40.00  . Pack years: 60.00  . Types: Cigarettes  Smokeless Tobacco Former Systems developer  . Types: Chew  Tobacco Comment   ready to quit wants to talk to [facility] at appointment     Ready to quit: No Counseling given: Not Answered Comment: ready to quit wants to talk to [facility] at appointment   Clinical Intake:  Pre-visit preparation completed: Yes  Pain : 0-10 Pain Score: 7  Pain Type: Chronic pain Pain Location: Hip Pain Orientation: Right, Left Pain Radiating Towards: knees Pain Descriptors / Indicators: Aching Pain Onset: More than a month ago Pain Frequency: Constant Pain Relieving Factors: blue emu helps some  Pain Relieving Factors: blue emu helps some  Nutritional Status: BMI 25 -29 Overweight Nutritional Risks: None Diabetes: Yes CBG done?: No Did pt. bring in CBG monitor from home?: No  How often do you need to have someone help you when you read instructions, pamphlets, or other written materials from your doctor or pharmacy?: 1 - Never What is the last grade level you completed in school?: 11th grade  Interpreter Needed?: No  Information entered by :: NAllen LPN  Past Medical History:  Diagnosis Date  . 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 Coronary atherosclerosis of unspecified type of vessel, native or graft   . Diabetes mellitus    Type II  . Esophageal stricture   . Gastritis   . Hiatal hernia   . Hx of colonic polyps   . Hyperlipidemia   . Hypertension   . Internal hemorrhoids without mention of complication   . Lumbago   . Pain in both lower legs   .  PSA elevation    now averaging 2's  . Tobacco abuse   . URI (upper respiratory infection)   . Urinary retention    Past Surgical History:  Procedure Laterality Date  . 2D Echo  06/03  . Abdominal ultrasound  06/03   Negative  . BACK SURGERY    . CT of head  and sinuses  06/03   Negative  . CT of the chest, abdomen, pelvis  06/03   Chest negative; Abdomen, left adrenal lesion; Pelvis, enlarged prostate  . ESOPHAGOGASTRODUODENOSCOPY  02/13/07   gastritis and duodenitis without bleed  .  ESOPHAGOGASTRODUODENOSCOPY N/A 06/26/2014   Procedure: ESOPHAGOGASTRODUODENOSCOPY (EGD);  Surgeon: Milus Banister, MD;  Location: [place];  Service: Endoscopy;  Laterality: N/A;  . KNEE ARTHROSCOPY  10/00   Right  . LOWER EXTREMITY ANGIOGRAPHY Right 12/27/2017   Procedure: LOWER EXTREMITY ANGIOGRAPHY;  Surgeon: Algernon Huxley, MD;  Location: [place] [facility];  Service: Cardiovascular;  Laterality: Right;  . LOWER EXTREMITY ANGIOGRAPHY Left 01/10/2018   Procedure: LOWER EXTREMITY ANGIOGRAPHY;  Surgeon: Algernon Huxley, MD;  Location: [place] [facility];  Service: Cardiovascular;  Laterality: Left;  . LOWER EXTREMITY ANGIOGRAPHY Left 05/29/2018   Procedure: LOWER EXTREMITY ANGIOGRAPHY;  Surgeon: Algernon Huxley, MD;  Location: [place] [facility];  Service: Cardiovascular;  Laterality: Left;  . LOWER EXTREMITY ANGIOGRAPHY Left 05/30/2018   Procedure: Lower Extremity Angiography;  Surgeon: Algernon Huxley, MD;  Location: [place] [facility];  Service: Cardiovascular;  Laterality: Left;  . LP  06/03  . Microwave thermotherapy prostate  08/28/07   Rogers Blocker  . Persantine cardiolite  03/01   EF 68%  . POLYPECTOMY  11/95  . Stress myoview  07/06   EF 67%   Family History  Problem Relation Age of Onset  . Diabetes Father   . Alcohol abuse Paternal Uncle   . Alcohol abuse Paternal Uncle   . Alcohol abuse Paternal Uncle   . Rheum arthritis Mother   . Heart disease Neg Hx   . Stroke Neg Hx   . Cancer Neg Hx   . Drug abuse Neg Hx   . Depression Neg Hx   . Colon cancer Neg Hx   . Rectal cancer Neg Hx   . Stomach cancer Neg Hx   . Kidney cancer Neg Hx   . Bladder Cancer Neg Hx   . Prostate cancer Neg Hx    Social History   Socioeconomic History  . Marital status: Married    Spouse name: [name]  . Number of children: 3  . Years of education: Not on file  . Highest education level: Not on file  Occupational History  . Occupation: Retired Animal nutritionist  . Financial resource  strain: Somewhat hard  . Food insecurity    Worry: Never true    Inability: Never true  . Transportation needs    Medical: No    Non-medical: No  Tobacco Use  . Smoking status: Current Every Day Smoker    Packs/day: 1.50    Years: 40.00    Pack years: 60.00    Types: Cigarettes  . Smokeless tobacco: Former Systems developer    Types: Chew  . Tobacco comment: ready to quit wants to talk to Anda Kraft at appointment  Substance and Sexual Activity  . Alcohol use: Not Currently    Alcohol/week: 0.0 standard drinks    Comment: rare  . Drug use: No  . Sexual activity: Yes  Lifestyle  .  Physical activity    Days per week: 0 days    Minutes per session: 0 min  . Stress: To some extent  Relationships  . Social connections    Talks on phone: More than three times a week    Gets together: Three times a week    Attends religious service: Never    Active member of club or organization: Not on file    Attends meetings of clubs or organizations: Not on file    Relationship status: Married  Other Topics Concern  . Not on file  Social History Narrative   Married 9/09 wife with moderate memory problems.   3 adult children, 2 grandchildren   Would desire CPR    Outpatient Encounter Medications as of 09/23/2018  Medication Sig  . apixaban (ELIQUIS) 5 MG TABS tablet Take 1 tablet (5 mg total) by mouth 2 (two) times daily.  [name] aspirin 81 MG tablet Take 81 mg by mouth daily.  [name] atorvastatin (LIPITOR) 20 MG tablet Take 1 tablet by mouth every evening for cholesterol.  . finasteride (PROSCAR) 5 MG tablet Take 1 tablet (5 mg total) by mouth daily.  [name] glipiZIDE (GLUCOTROL) 10 MG tablet Take 10 mg by mouth daily before supper.   [name] glucose blood ([employer] TEST STRIPS) test strip Use one strip three times daily.  . insulin detemir (LEVEMIR) 100 UNIT/ML injection Inject 20 units at bedtime. (Patient taking differently: every morning. Inject 20 units at breakfast)  . lisinopril-hydrochlorothiazide (ZESTORETIC) 20-25 MG  tablet Take 1 tablet by mouth once daily for blood pressure  . metFORMIN (GLUCOPHAGE) 500 MG tablet TAKE TWO TABLETS BY MOUTH TWICE DAILY WITH A MEAL for diabetes.  . metoprolol tartrate (LOPRESSOR) 50 MG tablet TAKE 1/2 (ONE-HALF) TABLET BY MOUTH TWICE DAILY  . pantoprazole (PROTONIX) 40 MG tablet Take 1 tablet (40 mg total) by mouth daily.  . tamsulosin (FLOMAX) 0.4 MG CAPS capsule Take two capsule daily  . oxyCODONE (OXY IR/ROXICODONE) 5 MG immediate release tablet One to Two Tabs Every Six Hours As Needed For Pain (Patient not taking: Reported on 09/23/2018)   No facility-administered encounter medications on file as of 09/23/2018.     Activities of Daily Living In your present state of health, do you have any difficulty performing the following activities: 09/23/2018 05/29/2018  Hearing? Y N  Comment needs hearing aides -  Vision? N N  Difficulty concentrating or making decisions? N N  Walking or climbing stairs? Y N  Comment due to hips and knees -  Dressing or bathing? N N  Doing errands, shopping? N N  Preparing Food and eating ? N -  Using the Toilet? N -  In the past six months, have you accidently leaked urine? N -  Do you have problems with loss of bowel control? N -  Managing your Medications? N -  Managing your Finances? N -  Housekeeping or managing your Housekeeping? N -  Some recent data might be hidden    Patient Care Team: Pleas Koch, NP as PCP - General (Internal Medicine) Laneta Simmers as Physician Assistant (Urology) Ladene Artist, MD as Consulting Physician (Gastroenterology)   Assessment:   This is a routine wellness examination for [place].  Exercise Activities and Dietary recommendations Current Exercise Habits: The patient does not participate in regular exercise at present  Goals    . Increase water intake     Starting 09/14/2017, I will continue to drink at least 6-8 glasses of  water daily.     . Patient Stated     09/23/2018, no  goals       Fall Risk Fall Risk  09/23/2018 09/14/2017 08/28/2016 07/12/2016 04/20/2015  Falls in the past year? 0 Yes No No Yes  Comment - pt tripped and fell; pt fell while trying to help wife get up Emmi Telephone Survey: data to providers prior to load - -  Number falls in past yr: - 2 or more - - 2 or more  Injury with Fall? - Yes - - No  Risk for fall due to : Medication side effect History of fall(s) - - -  Follow up Falls evaluation completed;Falls prevention discussed - - - -   Is the patient's home free of loose throw rugs in walkways, pet beds, electrical cords, etc?   yes      Grab bars in the bathroom? yes      Handrails on the stairs?  n/a      Adequate lighting?   yes  Timed Get Up and Go Performed: n/a  Depression Screen PHQ 2/9 Scores 09/23/2018 09/14/2017 07/12/2016 04/20/2015  PHQ - 2 Score 0 0 0 0  PHQ- 9 Score 6 0 - -    Cognitive Function MMSE - Mini Mental State Exam 09/23/2018 09/14/2017 07/12/2016  Orientation to time 5 5 5   Orientation to Place 5 5 5   Registration 3 3 3   Attention/ Calculation 5 0 0  Recall 3 3 3   Language- name 2 objects 0 0 0  Language- repeat 0 1 1  Language- follow 3 step command 0 3 3  Language- read & follow direction 0 0 0  Write a sentence 0 0 0  Copy design 0 0 0  Total score 21 20 20    Mini Cog  Mini-Cog screen was completed. Maximum score is 22. A value of 0 denotes this part of the MMSE was not completed or the patient failed this part of the Mini-Cog screening.       Immunization History  Administered Date(s) Administered  . Td 10/08/1994, 11/19/2006    Qualifies for Shingles Vaccine? yes  Screening Tests Health Maintenance  Topic Date Due  . COLONOSCOPY  08/04/2017  . HEMOGLOBIN A1C  03/17/2018  . OPHTHALMOLOGY EXAM  09/01/2018  . TETANUS/TDAP  09/23/2019 (Originally 11/18/2016)  . PNA vac Low Risk Adult (1 of 2 - PCV13) 09/23/2019 (Originally 12/24/2009)  . INFLUENZA VACCINE  01/21/2057 (Originally 09/07/2018)  .  FOOT EXAM  05/16/2019  . Hepatitis C Screening  Completed   Cancer Screenings: Lung: Low Dose CT Chest recommended if Age 56-80 years, 30 pack-year currently smoking OR have quit w/in 15years. Patient does qualify. Colorectal: due  Additional Screenings:  Hepatitis C Screening:07/2016      Plan:    Patient has no health goals for self. 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  I have personally reviewed and noted the following in the patient's chart:   . Medical and social history . Use of alcohol, tobacco or illicit drugs  . Current medications and supplements . Functional ability and status . Nutritional status . Physical activity . Advanced directives . List of other physicians . Hospitalizations, surgeries, and ER visits in previous 12 months . Vitals . Screenings to include cognitive, depression, and falls . Referrals and appointments  In addition, I have reviewed and discussed with patient certain preventive protocols, quality metrics, and best practice recommendations. A written personalized care plan for preventive  services as well as general preventive health recommendations were provided to patient.     Kellie Simmering, LPN  8/34/1963

## 2018-09-23 NOTE — Progress Notes (Signed)
PCP notes:  Health Maintenance:  PCV13, tetanus, colonoscopy and eye exam are due. Declines tetanus and PCV13.  Abnormal Screenings:  Depression screen: 6; Has trouble with sleeping at night and is tired due to caring for his wife.  Patient concerns:  Patient wants to discuss quitting smoking and the pain he has in his knees and hips.  Nurse concerns:  None  Next PCP appt.: 09/30/2018 at 9:00

## 2018-09-24 ENCOUNTER — Other Ambulatory Visit: Payer: Self-pay

## 2018-09-24 ENCOUNTER — Other Ambulatory Visit (INDEPENDENT_AMBULATORY_CARE_PROVIDER_SITE_OTHER): Payer: Medicare Other

## 2018-09-24 DIAGNOSIS — E11618 Type 2 diabetes mellitus with other diabetic arthropathy: Secondary | ICD-10-CM

## 2018-09-24 DIAGNOSIS — I10 Essential (primary) hypertension: Secondary | ICD-10-CM

## 2018-09-24 DIAGNOSIS — N138 Other obstructive and reflux uropathy: Secondary | ICD-10-CM | POA: Diagnosis not present

## 2018-09-24 DIAGNOSIS — E7800 Pure hypercholesterolemia, unspecified: Secondary | ICD-10-CM | POA: Diagnosis not present

## 2018-09-24 DIAGNOSIS — Z794 Long term (current) use of insulin: Secondary | ICD-10-CM | POA: Diagnosis not present

## 2018-09-24 DIAGNOSIS — N401 Enlarged prostate with lower urinary tract symptoms: Secondary | ICD-10-CM | POA: Diagnosis not present

## 2018-09-24 LAB — CBC
HCT: 40.3 % (ref 39.0–52.0)
Hemoglobin: 13.5 g/dL (ref 13.0–17.0)
MCHC: 33.6 g/dL (ref 30.0–36.0)
MCV: 89 fl (ref 78.0–100.0)
Platelets: 254 10*3/uL (ref 150.0–400.0)
RBC: 4.53 Mil/uL (ref 4.22–5.81)
RDW: 14.7 % (ref 11.5–15.5)
WBC: 10.4 10*3/uL (ref 4.0–10.5)

## 2018-09-24 LAB — LIPID PANEL
Cholesterol: 118 mg/dL (ref 0–200)
HDL: 36.7 mg/dL — ABNORMAL LOW (ref >39.00)
LDL Cholesterol: 51 mg/dL (ref 0–99)
NonHDL: 81.72
Total CHOL/HDL Ratio: 3
Triglycerides: 153 mg/dL — ABNORMAL HIGH (ref 0.0–149.0)
VLDL: 30.6 mg/dL (ref 0.0–40.0)

## 2018-09-24 LAB — PSA: PSA: 0.43 ng/mL (ref 0.10–4.00)

## 2018-09-24 LAB — COMPREHENSIVE METABOLIC PANEL
ALT: 9 U/L (ref 0–53)
AST: 9 U/L (ref 0–37)
Albumin: 4.2 g/dL (ref 3.5–5.2)
Alkaline Phosphatase: 52 U/L (ref 39–117)
BUN: 30 mg/dL — ABNORMAL HIGH (ref 6–23)
CO2: 25 mEq/L (ref 19–32)
Calcium: 9.5 mg/dL (ref 8.4–10.5)
Chloride: 104 mEq/L (ref 96–112)
Creatinine, Ser: 1.13 mg/dL (ref 0.40–1.50)
GFR: 63.48 mL/min (ref >60.00)
Glucose, Bld: 154 mg/dL — ABNORMAL HIGH (ref 70–99)
Potassium: 4.3 mEq/L (ref 3.5–5.1)
Sodium: 139 mEq/L (ref 135–145)
Total Bilirubin: 0.4 mg/dL (ref 0.2–1.2)
Total Protein: 6.6 g/dL (ref 6.0–8.3)

## 2018-09-24 LAB — HEMOGLOBIN A1C: Hgb A1c MFr Bld: 7.6 % — ABNORMAL HIGH (ref 4.6–6.5)

## 2018-09-29 ENCOUNTER — Other Ambulatory Visit: Payer: Self-pay | Admitting: Primary Care

## 2018-09-29 DIAGNOSIS — K2100 Gastro-esophageal reflux disease with esophagitis, without bleeding: Secondary | ICD-10-CM

## 2018-09-29 DIAGNOSIS — E11618 Type 2 diabetes mellitus with other diabetic arthropathy: Secondary | ICD-10-CM

## 2018-09-29 DIAGNOSIS — I10 Essential (primary) hypertension: Secondary | ICD-10-CM

## 2018-09-29 DIAGNOSIS — Z794 Long term (current) use of insulin: Secondary | ICD-10-CM

## 2018-09-30 ENCOUNTER — Ambulatory Visit (INDEPENDENT_AMBULATORY_CARE_PROVIDER_SITE_OTHER): Payer: Medicare Other | Admitting: Primary Care

## 2018-09-30 ENCOUNTER — Other Ambulatory Visit: Payer: Self-pay

## 2018-09-30 ENCOUNTER — Encounter: Payer: Self-pay | Admitting: Primary Care

## 2018-09-30 VITALS — BP 136/62 | HR 49 | Temp 98.0°F | Ht 66.0 in | Wt 168.5 lb

## 2018-09-30 DIAGNOSIS — I2510 Atherosclerotic heart disease of native coronary artery without angina pectoris: Secondary | ICD-10-CM | POA: Diagnosis not present

## 2018-09-30 DIAGNOSIS — Z1211 Encounter for screening for malignant neoplasm of colon: Secondary | ICD-10-CM

## 2018-09-30 DIAGNOSIS — E11618 Type 2 diabetes mellitus with other diabetic arthropathy: Secondary | ICD-10-CM | POA: Diagnosis not present

## 2018-09-30 DIAGNOSIS — Z122 Encounter for screening for malignant neoplasm of respiratory organs: Secondary | ICD-10-CM | POA: Diagnosis not present

## 2018-09-30 DIAGNOSIS — N401 Enlarged prostate with lower urinary tract symptoms: Secondary | ICD-10-CM | POA: Diagnosis not present

## 2018-09-30 DIAGNOSIS — I739 Peripheral vascular disease, unspecified: Secondary | ICD-10-CM | POA: Diagnosis not present

## 2018-09-30 DIAGNOSIS — K2100 Gastro-esophageal reflux disease with esophagitis, without bleeding: Secondary | ICD-10-CM

## 2018-09-30 DIAGNOSIS — I10 Essential (primary) hypertension: Secondary | ICD-10-CM | POA: Diagnosis not present

## 2018-09-30 DIAGNOSIS — Z23 Encounter for immunization: Secondary | ICD-10-CM | POA: Diagnosis not present

## 2018-09-30 DIAGNOSIS — I70229 Atherosclerosis of native arteries of extremities with rest pain, unspecified extremity: Secondary | ICD-10-CM

## 2018-09-30 DIAGNOSIS — N138 Other obstructive and reflux uropathy: Secondary | ICD-10-CM

## 2018-09-30 DIAGNOSIS — Z720 Tobacco use: Secondary | ICD-10-CM

## 2018-09-30 DIAGNOSIS — E7800 Pure hypercholesterolemia, unspecified: Secondary | ICD-10-CM

## 2018-09-30 DIAGNOSIS — Z794 Long term (current) use of insulin: Secondary | ICD-10-CM

## 2018-09-30 NOTE — Addendum Note (Signed)
Addended by: Jacqualin Combes on: 09/30/2018 10:00 AM   Modules accepted: Orders

## 2018-09-30 NOTE — Assessment & Plan Note (Signed)
Compliant to statin, recent LDL of 51 which is under excellent control. He is ready to quit smoking for which I did commend him on. He will try OTC agents first.

## 2018-09-30 NOTE — Patient Instructions (Signed)
It's important to improve your diet by reducing consumption of fast food, fried food, processed snack foods, sugary drinks. Increase consumption of fresh vegetables and fruits, whole grains, water.  Ensure you are drinking 64 ounces of water daily.  Start exercising. You should be getting 150 minutes of exercise weekly.  You will be contacted regarding your referral to GI for the colonoscopy and from Pulmonology for the lung cancer screening.  Please let us know if you have not been contacted within one week.   It was a pleasure to see you today!

## 2018-09-30 NOTE — Assessment & Plan Note (Signed)
Following with endocrinology. Recent A1C of 7.6. Continue current regimen.

## 2018-09-30 NOTE — Assessment & Plan Note (Signed)
Compliant to statin therapy. Recent LDL of 51 which [place] under good control. Continue same.

## 2018-09-30 NOTE — Assessment & Plan Note (Addendum)
Following with Urology annually.  Recent PSA of 0.43. Continue finasteride and Flomax. Self catheterizes 1-2 times monthly.

## 2018-09-30 NOTE — Assessment & Plan Note (Signed)
Following with vascular surgery.  Ready to quit smoking, he will try OTC products first then update if he is not successful. Commended him on this decision.

## 2018-09-30 NOTE — Progress Notes (Signed)
Subjective:    Patient ID: Antonio [name], male    DOB: [birth year], 73 y.o.   MRN: [id number]  HPI  Antonio [name] is a 73 year old male who presents today for [place] Part 2. He was evaluated by our health advisor last week.   Immunizations: -Tetanus: Completed in 2008, declines -Influenza: Due, declines -Pneumonia: Never completed, due -Shingles: Never completed  Eye exam: Completed in early 2020 Colonoscopy: Completed in 2016, due in 2019 and has not completed. PSA: 0.6 in 2019 Hep C Screen: Negative Lung Cancer Screening: Never completed, due  BP Readings from Last 3 Encounters:  09/30/18 136/62  06/25/18 (!) 189/70  05/31/18 (!) 165/65     Review of Systems  Constitutional: Negative for unexpected weight change.  Eyes: Negative for visual disturbance.  Respiratory: Negative for shortness of breath.   Gastrointestinal: Negative for constipation and diarrhea.  Musculoskeletal: Positive for arthralgias.  Neurological: Negative for dizziness and headaches.       Past Medical History:  Diagnosis Date  . 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 Coronary atherosclerosis of unspecified type of vessel, native or graft   . Diabetes mellitus    Type II  . Esophageal stricture   . Gastritis   . Hiatal hernia   . Hx of colonic polyps   . Hyperlipidemia   . Hypertension   . Internal hemorrhoids without mention of complication   . Lumbago   . Pain in both lower legs   . PSA elevation    now averaging 2's  . Tobacco abuse   . URI (upper respiratory infection)   . Urinary retention      Social History   Socioeconomic History  . Marital status: Married    Spouse name: [name]  . Number of children: 3  . Years of education: Not on file  . Highest education level: Not on file  Occupational History  . Occupation: Retired Animal nutritionist  . Financial resource strain: Somewhat hard  . Food insecurity    Worry: Never true    Inability:  Never true  . Transportation needs    Medical: No    Non-medical: No  Tobacco Use  . Smoking status: Current Every Day Smoker    Packs/day: 1.50    Years: 40.00    Pack years: 60.00    Types: Cigarettes  . Smokeless tobacco: Former Systems developer    Types: Chew  . Tobacco comment: ready to quit wants to talk to Anda Kraft at appointment  Substance and Sexual Activity  . Alcohol use: Not Currently    Alcohol/week: 0.0 standard drinks    Comment: rare  . Drug use: No  . Sexual activity: Yes  Lifestyle  . Physical activity    Days per week: 0 days    Minutes per session: 0 min  . Stress: To some extent  Relationships  . Social connections    Talks on phone: More than three times a week    Gets together: Three times a week    Attends religious service: Never    Active member of club or organization: Not on file    Attends meetings of clubs or organizations: Not on file    Relationship status: Married  . Intimate partner violence    Fear of current or ex partner: No    Emotionally abused: No    Physically abused: No    Forced sexual activity: No  Other Topics Concern  .  Not on file  Social History Narrative   Married 9/09 wife with moderate memory problems.   3 adult children, 2 grandchildren   Would desire CPR    Past Surgical History:  Procedure Laterality Date  . 2D Echo  06/03  . Abdominal ultrasound  06/03   Negative  . BACK SURGERY    . CT of head  and sinuses  06/03   Negative  . CT of the chest, abdomen, pelvis  06/03   Chest negative; Abdomen, left adrenal lesion; Pelvis, enlarged prostate  . ESOPHAGOGASTRODUODENOSCOPY  02/13/07   gastritis and duodenitis without bleed  . ESOPHAGOGASTRODUODENOSCOPY N/A 06/26/2014   Procedure: ESOPHAGOGASTRODUODENOSCOPY (EGD);  Surgeon: Milus Banister, MD;  Location: [place];  Service: Endoscopy;  Laterality: N/A;  . KNEE ARTHROSCOPY  10/00   Right  . LOWER EXTREMITY ANGIOGRAPHY Right 12/27/2017   Procedure: LOWER EXTREMITY  ANGIOGRAPHY;  Surgeon: Algernon Huxley, MD;  Location: [place] [facility];  Service: Cardiovascular;  Laterality: Right;  . LOWER EXTREMITY ANGIOGRAPHY Left 01/10/2018   Procedure: LOWER EXTREMITY ANGIOGRAPHY;  Surgeon: Algernon Huxley, MD;  Location: [place] [facility];  Service: Cardiovascular;  Laterality: Left;  . LOWER EXTREMITY ANGIOGRAPHY Left 05/29/2018   Procedure: LOWER EXTREMITY ANGIOGRAPHY;  Surgeon: Algernon Huxley, MD;  Location: [facility] [facility];  Service: Cardiovascular;  Laterality: Left;  . LOWER EXTREMITY ANGIOGRAPHY Left 05/30/2018   Procedure: Lower Extremity Angiography;  Surgeon: Algernon Huxley, MD;  Location: [place] [facility];  Service: Cardiovascular;  Laterality: Left;  . LP  06/03  . Microwave thermotherapy prostate  08/28/07   Rogers Blocker  . Persantine cardiolite  03/01   EF 68%  . POLYPECTOMY  11/95  . Stress myoview  07/06   EF 67%    Family History  Problem Relation Age of Onset  . Diabetes Father   . Alcohol abuse Paternal Uncle   . Alcohol abuse Paternal Uncle   . Alcohol abuse Paternal Uncle   . Rheum arthritis Mother   . Heart disease Neg Hx   . Stroke Neg Hx   . Cancer Neg Hx   . Drug abuse Neg Hx   . Depression Neg Hx   . Colon cancer Neg Hx   . Rectal cancer Neg Hx   . Stomach cancer Neg Hx   . Kidney cancer Neg Hx   . Bladder Cancer Neg Hx   . Prostate cancer Neg Hx     No Known Allergies  Current Outpatient Medications on File Prior to Visit  Medication Sig Dispense Refill  . apixaban (ELIQUIS) 5 MG TABS tablet Take 1 tablet (5 mg total) by mouth 2 (two) times daily. 60 tablet 11  . aspirin 81 MG tablet Take 81 mg by mouth daily.    [name] atorvastatin (LIPITOR) 20 MG tablet Take 1 tablet by mouth every evening for cholesterol. 90 tablet 3  . finasteride (PROSCAR) 5 MG tablet Take 1 tablet (5 mg total) by mouth daily. 90 tablet 3  . glipiZIDE (GLUCOTROL) 10 MG tablet Take 10 mg by mouth daily before supper.     [name] glucose blood ([employer] TEST  STRIPS) test strip Use one strip three times daily.    . insulin detemir (LEVEMIR) 100 UNIT/ML injection Inject 20 units at bedtime. (Patient taking differently: every morning. Inject 20 units at breakfast) 10 pen 5  . lisinopril-hydrochlorothiazide (ZESTORETIC) 20-25 MG tablet Take 1 tablet by mouth once daily for blood pressure 90 tablet 0  .  metFORMIN (GLUCOPHAGE) 500 MG tablet TAKE TWO TABLETS BY MOUTH TWICE DAILY WITH A MEAL for diabetes. 360 tablet 3  . metoprolol tartrate (LOPRESSOR) 50 MG tablet TAKE 1/2 (ONE-HALF) TABLET BY MOUTH TWICE DAILY 90 tablet 1  . pantoprazole (PROTONIX) 40 MG tablet Take 1 tablet (40 mg total) by mouth daily. 90 tablet 3  . tamsulosin (FLOMAX) 0.4 MG CAPS capsule Take two capsule daily 180 capsule 4   No current facility-administered medications on file prior to visit.     BP 136/62   Pulse (!) 49   Temp 98 F (36.7 C) (Temporal)   Ht 5\' 6"  (1.676 m)   Wt 168 lb 8 oz (76.4 kg)   SpO2 98%   BMI 27.20 kg/m    Objective:   Physical Exam  Constitutional: He appears well-nourished.  Neck: Neck supple.  Cardiovascular: Normal rate and regular rhythm.  Respiratory: Effort normal and breath sounds normal.  Skin: Skin is warm and dry.  Psychiatric: He has a normal mood and affect.           Assessment & Plan:

## 2018-09-30 NOTE — Assessment & Plan Note (Signed)
Stable on pantoprazole, continue same.

## 2018-09-30 NOTE — Assessment & Plan Note (Signed)
Ready to quit, commended him on this decision. He will update if he's unable to quit with OTC products. Referral placed for lung cancer screening.

## 2018-09-30 NOTE — Assessment & Plan Note (Addendum)
Stable in the office today, continue current regimen.  BMP reviewed. 

## 2018-10-01 ENCOUNTER — Telehealth: Payer: Self-pay | Admitting: *Deleted

## 2018-10-01 ENCOUNTER — Encounter: Payer: Self-pay | Admitting: *Deleted

## 2018-10-01 DIAGNOSIS — Z122 Encounter for screening for malignant neoplasm of respiratory organs: Secondary | ICD-10-CM

## 2018-10-01 DIAGNOSIS — Z87891 Personal history of nicotine dependence: Secondary | ICD-10-CM

## 2018-10-01 NOTE — Telephone Encounter (Signed)
Received referral for initial lung cancer screening scan. Contacted patient and obtained smoking history,(current, 85.5 pack year) as well as answering questions related to screening process. Patient denies signs of lung cancer such as weight loss or hemoptysis. Patient denies comorbidity that would prevent curative treatment if lung cancer were found. Patient is scheduled for shared decision making visit and CT scan on 10/03/18 at 1045am.

## 2018-10-03 ENCOUNTER — Other Ambulatory Visit: Payer: Self-pay

## 2018-10-03 ENCOUNTER — Inpatient Hospital Stay: Payer: Medicare Other | Attending: Oncology | Admitting: Oncology

## 2018-10-03 ENCOUNTER — Ambulatory Visit
Admission: RE | Admit: 2018-10-03 | Discharge: 2018-10-03 | Disposition: A | Discharge status: Home / Self Care | Payer: Medicare Other | Source: Ambulatory Visit | Attending: Oncology | Admitting: Oncology

## 2018-10-03 DIAGNOSIS — Z122 Encounter for screening for malignant neoplasm of respiratory organs: Secondary | ICD-10-CM | POA: Diagnosis not present

## 2018-10-03 DIAGNOSIS — Z87891 Personal history of nicotine dependence: Secondary | ICD-10-CM | POA: Insufficient documentation

## 2018-10-03 DIAGNOSIS — F17210 Nicotine dependence, cigarettes, uncomplicated: Secondary | ICD-10-CM | POA: Diagnosis not present

## 2018-10-03 DIAGNOSIS — Z136 Encounter for screening for cardiovascular disorders: Secondary | ICD-10-CM | POA: Diagnosis not present

## 2018-10-03 NOTE — Progress Notes (Signed)
Virtual Visit via Video Note  I connected with@ on 10/03/18 at 10:45 AM EDT by a video enabled telemedicine application and verified that I am speaking with the correct person using two identifiers.  Location: Patient: [facility] Provider: Office   I discussed the limitations of evaluation and management by telemedicine and the availability of in person appointments. The patient expressed understanding and agreed to proceed.  I discussed the assessment and treatment plan with the patient. The patient was provided an opportunity to ask questions and all were answered. The patient agreed with the plan and demonstrated an understanding of the instructions.   The patient was advised to call back or seek an in-person evaluation if the symptoms worsen or if the condition fails to improve as anticipated.   In accordance with CMS guidelines, patient has met eligibility criteria including age, absence of signs or symptoms of lung cancer.  Social History   Tobacco Use  . Smoking status: Current Every Day Smoker    Packs/day: 1.50    Years: 57.00    Pack years: 85.50    Types: Cigarettes  . Smokeless tobacco: Former Systems developer    Types: Chew  . Tobacco comment: ready to quit wants to talk to [facility] at appointment  Substance Use Topics  . Alcohol use: Not Currently    Alcohol/week: 0.0 standard drinks    Comment: rare  . Drug use: No      A shared decision-making session was conducted prior to the performance of CT scan. This includes one or more decision aids, includes benefits and harms of screening, follow-up diagnostic testing, over-diagnosis, false positive rate, and total radiation exposure.   Counseling on the importance of adherence to annual lung cancer LDCT screening, impact of co-morbidities, and ability or willingness to undergo diagnosis and treatment is imperative for compliance of the program.   Counseling on the importance of continued smoking cessation for former smokers; the importance  of smoking cessation for current smokers, and information about tobacco cessation interventions have been given to patient including [facility] and [facility] quit [place] programs.   Written order for lung cancer screening with LDCT has been given to the patient and any and all questions have been answered to the best of my abilities.    Yearly follow up will be coordinated by Burgess Estelle, Thoracic Navigator.  I provided 15 minutes of face-to-face video visit time during this encounter, and > 50% was spent counseling as documented under my assessment & plan.   Jacquelin Hawking, NP

## 2018-10-04 ENCOUNTER — Telehealth: Payer: Self-pay | Admitting: *Deleted

## 2018-10-04 NOTE — Telephone Encounter (Signed)
Notified patient of LDCT lung cancer screening program results with recommendation for 12 month follow up imaging. Also notified of incidental findings noted below and is encouraged to discuss further with PCP who will receive a copy of this note and/or the CT report. Patient verbalizes understanding. Also, not in the impression but in an addendum is an esophageal thickening that is seen. The patient reports prior dilatation to this area x 2. Message sent to GI providers that did procedures for Mr. Berthold as well as his PCP. Patient is aware of this finding as well.   IMPRESSION: 1. Lung-RADS 1S, negative. Continue annual screening with low-dose chest CT without contrast in 12 months. 2. The "S" modifier above refers to potentially clinically significant non lung cancer related findings. Specifically, there is aortic atherosclerosis, in addition to three-vessel coronary artery disease. Assessment for potential risk factor modification, dietary therapy or pharmacologic therapy may be warranted, if clinically indicated. 3. Mild diffuse bronchial wall thickening with very mild centrilobular and paraseptal emphysema; imaging findings suggestive of underlying COPD. 4. Left adrenal adenoma.  Aortic Atherosclerosis (ICD10-I70.0) and Emphysema (ICD10-J43.9).

## 2018-10-07 ENCOUNTER — Telehealth: Payer: Self-pay

## 2018-10-07 NOTE — Telephone Encounter (Signed)
Patient is on Eliquis for atrial fib and bilateral stents due to PVD placed last year.  Okay to keep as a direct? Right now he is scheduled for 10/22/18.

## 2018-10-07 NOTE — Telephone Encounter (Signed)
-----   Message from Ladene Artist, MD sent at 10/04/2018 10:18 PM EDT ----- Regarding: RE: esophageal finding from lung screening scan My office will contact him to schedule further evaluation with EGD.  Sheri, please contact Mr. Lenardo Westwood to schedule EGD. We can schedule office appt first if he prefers.  MS ----- Message ----- From: Lieutenant Diego, RN Sent: 10/04/2018   2:36 PM EDT To: Milus Banister, MD, Ladene Artist, MD, # Subject: esophageal finding from lung screening scan    Dr. Ardis Hughs and Fuller Plan, Please see recent chest CT for Mr. Astarita, including esophageal finding. I believe he has had procedures by both of you in the past. Please advise for ongoing management of this finding. Thanks, [employer]

## 2018-10-08 NOTE — Telephone Encounter (Signed)
Patient is scheduled for EGD with possible dilation on 10/22/18, can he hold his Eliquis for 2 days prior to the procedure?

## 2018-10-08 NOTE — Telephone Encounter (Signed)
OK for direct as long as he has clearance for Eliquis hold for 2 days prior to EGD.

## 2018-10-15 NOTE — Telephone Encounter (Signed)
Antonio Huxley, MD sent to Marlon Pel, RN        He can stop it after his evening dose on 9/12 and resume the day after the procedure   Previous Messages

## 2018-10-15 NOTE — Telephone Encounter (Signed)
Letter sent to Dr. Leotis Pain  Of [facility]

## 2018-10-17 ENCOUNTER — Other Ambulatory Visit: Payer: Self-pay

## 2018-10-17 ENCOUNTER — Ambulatory Visit (AMBULATORY_SURGERY_CENTER): Payer: Self-pay

## 2018-10-17 VITALS — Ht 65.5 in | Wt 171.2 lb

## 2018-10-17 DIAGNOSIS — R933 Abnormal findings on diagnostic imaging of other parts of digestive tract: Secondary | ICD-10-CM

## 2018-10-17 NOTE — Progress Notes (Signed)
Denies allergies to eggs or soy products. Denies complication of anesthesia or sedation. Denies use of weight loss medication. Denies use of O2.   Emmi instructions given for colonoscopy.  

## 2018-10-21 ENCOUNTER — Telehealth: Payer: Self-pay | Admitting: Gastroenterology

## 2018-10-21 NOTE — Telephone Encounter (Signed)
Do you now or have you had a fever in the last 14 days?    No     Do you have any respiratory symptoms of shortness of breath or cough now or in the last 14 days?   No     Do you have any family members or close contacts with diagnosed or suspected Covid-19 in the past 14 days?   No      Have you been tested for Covid-19 and found to be positive?   No     Pt made aware to check in on theth floor and that care partner may wait in the car or come up to the lobby during the procedure but will need to provide their own mask.

## 2018-10-22 ENCOUNTER — Ambulatory Visit (AMBULATORY_SURGERY_CENTER): Payer: Medicare Other | Admitting: Gastroenterology

## 2018-10-22 ENCOUNTER — Encounter: Payer: Self-pay | Admitting: Gastroenterology

## 2018-10-22 ENCOUNTER — Other Ambulatory Visit: Payer: Self-pay

## 2018-10-22 VITALS — BP 112/57 | HR 56 | Temp 97.6°F | Resp 24 | Ht 65.0 in | Wt 171.0 lb

## 2018-10-22 DIAGNOSIS — R1310 Dysphagia, unspecified: Secondary | ICD-10-CM

## 2018-10-22 DIAGNOSIS — R933 Abnormal findings on diagnostic imaging of other parts of digestive tract: Secondary | ICD-10-CM | POA: Diagnosis not present

## 2018-10-22 DIAGNOSIS — K219 Gastro-esophageal reflux disease without esophagitis: Secondary | ICD-10-CM

## 2018-10-22 DIAGNOSIS — I10 Essential (primary) hypertension: Secondary | ICD-10-CM | POA: Diagnosis not present

## 2018-10-22 DIAGNOSIS — J439 Emphysema, unspecified: Secondary | ICD-10-CM | POA: Diagnosis not present

## 2018-10-22 DIAGNOSIS — E119 Type 2 diabetes mellitus without complications: Secondary | ICD-10-CM | POA: Diagnosis not present

## 2018-10-22 MED ORDER — SODIUM CHLORIDE 0.9 % IV SOLN: 500.0000 mL | Freq: Once | INTRAVENOUS | Status: DC

## 2018-10-22 NOTE — Progress Notes (Signed)
Called to room to assist during endoscopic procedure.  Patient ID and intended procedure confirmed with present staff. Received instructions for my participation in the procedure from the performing physician.  

## 2018-10-22 NOTE — Progress Notes (Signed)
Temp check by AM/vital check by CW.  No medical or surgical history changes since pre-visit screening.

## 2018-10-22 NOTE — Patient Instructions (Addendum)
Read all of the handouts given to you by your recovery room nurse.  The diet was ordered by Dr. Fuller Plan. Thank-you.  You may resume your Eliquis tomorrow.  YOU HAD AN ENDOSCOPIC PROCEDURE TODAY AT [facility]:   Refer to the procedure report that was given to you for any specific questions about what was found during the examination.  If the procedure report does not answer your questions, please call your gastroenterologist to clarify.  If you requested that your care partner not be given the details of your procedure findings, then the procedure report has been included in a sealed envelope for you to review at your convenience later.  YOU SHOULD EXPECT: Some feelings of bloating in the abdomen. Passage of more gas than usual.  Walking can help get rid of the air that was put into your GI tract during the procedure and reduce the bloating.   Please Note:  You might notice some irritation and congestion in your nose or some drainage.  This is from the oxygen used during your procedure.  There is no need for concern and it should clear up in a day or so.  SYMPTOMS TO REPORT IMMEDIATELY:   Following upper endoscopy (EGD)  Vomiting of blood or coffee ground material  New chest pain or pain under the shoulder blades  Painful or persistently difficult swallowing  New shortness of breath  Fever of 100F or higher  Black, tarry-looking stools  For urgent or emergent issues, a gastroenterologist can be reached at any hour by calling [phone].   DIET:  We do recommend clear liquids x 2 hours, and then a soft diet for the rest of today.  Tomorrow you may proceed to your regular diet.  Drink plenty of fluids but you should avoid alcoholic beverages for 24 hours.  ACTIVITY:  You should plan to take it easy for the rest of today and you should NOT DRIVE or use heavy machinery until tomorrow (because of the sedation medicines used during the test).    FOLLOW UP: Our staff will  call the number listed on your records 48-72 hours following your procedure to check on you and address any questions or concerns that you may have regarding the information given to you following your procedure. If we do not reach you, we will leave a message.  We will attempt to reach you two times.  During this call, we will ask if you have developed any symptoms of COVID 19. If you develop any symptoms (ie: fever, flu-like symptoms, shortness of breath, cough etc.) before then, please call [phone].  If you test positive for Covid 19 in the 2 weeks post procedure, please call and report this information to [place].    If any biopsies were taken you will be contacted by phone or by letter within the next 1-3 weeks.  Please call us at [phone] if you have not heard about the biopsies in 3 weeks.    SIGNATURES/CONFIDENTIALITY: You and/or your care partner have signed paperwork which will be entered into your electronic medical record.  These signatures attest to the fact that that the information above on your After Visit Summary has been reviewed and is understood.  Full responsibility of the confidentiality of this discharge information lies with you and/or your care-partner.

## 2018-10-22 NOTE — Progress Notes (Signed)
Report to PACU, RN, vss, BBS= Clear.  

## 2018-10-22 NOTE — Op Note (Signed)
[place] Patient Name: Antonio [name] Procedure Date: 10/22/2018 3:42 PM MRN: [id number] Endoscopist: Ladene Artist , MD Age: 73 Referring MD:  Date of Birth: [birth year] Gender: Male Account #: [id number] Procedure:                Upper GI endoscopy Indications:              Dysphagia, Gastroesophageal reflux disease,                            Abnormal CT of the GI tract-esophagus Medicines:                Monitored Anesthesia Care Procedure:                Pre-Anesthesia Assessment:                           - Prior to the procedure, a History and Physical                            was performed, and patient medications and                            allergies were reviewed. The patient's tolerance of                            previous anesthesia was also reviewed. The risks                            and benefits of the procedure and the sedation                            options and risks were discussed with the patient.                            All questions were answered, and informed consent                            was obtained. Prior Anticoagulants: The patient has                            taken Eliquis (apixaban), last dose was 2 days                            prior to procedure. ASA Grade Assessment: III - A                            patient with severe systemic disease. After                            reviewing the risks and benefits, the patient was                            deemed in satisfactory condition to undergo the  procedure.                           After obtaining informed consent, the endoscope was                            passed under direct vision. Throughout the                            procedure, the patient's blood pressure, pulse, and                            oxygen saturations were monitored continuously. The                            Endoscope was introduced through the mouth, and                 advanced to the second part of duodenum. The upper                            GI endoscopy was accomplished without difficulty.                            The patient tolerated the procedure well. Scope In: Scope Out: Findings:                 No endoscopic abnormality was evident in the                            esophagus to explain the patient's complaint of                            dysphagia. The esophagus was mildly tortuous. It                            was decided, however, to proceed with dilation of                            the entire esophagus. A guidewire was placed and                            the scope was withdrawn. Dilation was performed                            with a Savary dilator with no resistance at 16 mm.                            No heme.                           The entire examined stomach was normal.                           The duodenal bulb and second portion of the  duodenum were normal. Complications:            No immediate complications. Estimated Blood Loss:     Estimated blood loss: none. Impression:               - No endoscopic esophageal abnormality to explain                            patient's dysphagia. Esophagus dilated. Dilated.                           - Normal stomach.                           - Normal duodenal bulb and second portion of the                            duodenum.                           - No specimens collected. Recommendation:           - Patient has a contact number available for                            emergencies. The signs and symptoms of potential                            delayed complications were discussed with the                            patient. Return to normal activities tomorrow.                            Written discharge instructions were provided to the                            patient.                           - Clear liquid diet for 2 hours,  then advance as                            tolerated to soft diet today.                           - Resume prior diet tomorrow.                           - Follow antireflux measures long term.                           - Continue present medications.                           - If dysphagia persists consider esophageal  manometry.                           - Resume Eliquis (apixaban) at prior dose tomorrow.                            Refer to managing physician for further adjustment                            of therapy. Ladene Artist, MD 10/22/2018 4:09:22 PM This report has been signed electronically.

## 2018-10-24 ENCOUNTER — Telehealth: Payer: Self-pay

## 2018-10-24 NOTE — Telephone Encounter (Signed)
  Follow up Call-  Call back number 10/22/2018  Post procedure Call Back phone  # [phone]  Permission to leave phone message Yes  Some recent data might be hidden     Patient questions:  Do you have a fever, pain , or abdominal swelling? No. Pain Score  0 *  Have you tolerated food without any problems? Yes.    Have you been able to return to your normal activities? Yes.    Do you have any questions about your discharge instructions: Diet   No. Medications  No. Follow up visit  No.  Do you have questions or concerns about your Care? No.  Actions: * If pain score is 4 or above: No action needed, pain <4.

## 2018-10-24 NOTE — Telephone Encounter (Signed)
Left message on answering machine. 

## 2018-10-25 ENCOUNTER — Other Ambulatory Visit: Payer: Self-pay | Admitting: Urology

## 2018-10-25 DIAGNOSIS — N138 Other obstructive and reflux uropathy: Secondary | ICD-10-CM

## 2018-11-01 ENCOUNTER — Ambulatory Visit: Payer: Medicare Other | Admitting: Urology

## 2018-11-19 ENCOUNTER — Other Ambulatory Visit: Payer: Self-pay | Admitting: Primary Care

## 2018-11-19 DIAGNOSIS — I10 Essential (primary) hypertension: Secondary | ICD-10-CM

## 2018-11-19 DIAGNOSIS — E782 Mixed hyperlipidemia: Secondary | ICD-10-CM

## 2018-11-25 DIAGNOSIS — E1159 Type 2 diabetes mellitus with other circulatory complications: Secondary | ICD-10-CM | POA: Diagnosis not present

## 2018-12-02 DIAGNOSIS — Z794 Long term (current) use of insulin: Secondary | ICD-10-CM | POA: Diagnosis not present

## 2018-12-02 DIAGNOSIS — E1159 Type 2 diabetes mellitus with other circulatory complications: Secondary | ICD-10-CM | POA: Diagnosis not present

## 2018-12-02 DIAGNOSIS — E538 Deficiency of other specified B group vitamins: Secondary | ICD-10-CM | POA: Diagnosis not present

## 2018-12-02 DIAGNOSIS — I10 Essential (primary) hypertension: Secondary | ICD-10-CM | POA: Diagnosis not present

## 2018-12-02 DIAGNOSIS — E1129 Type 2 diabetes mellitus with other diabetic kidney complication: Secondary | ICD-10-CM | POA: Diagnosis not present

## 2018-12-02 DIAGNOSIS — F17200 Nicotine dependence, unspecified, uncomplicated: Secondary | ICD-10-CM | POA: Diagnosis not present

## 2018-12-02 DIAGNOSIS — E1142 Type 2 diabetes mellitus with diabetic polyneuropathy: Secondary | ICD-10-CM | POA: Diagnosis not present

## 2018-12-02 DIAGNOSIS — R809 Proteinuria, unspecified: Secondary | ICD-10-CM | POA: Diagnosis not present

## 2018-12-22 ENCOUNTER — Other Ambulatory Visit: Payer: Self-pay | Admitting: Urology

## 2018-12-27 ENCOUNTER — Ambulatory Visit (INDEPENDENT_AMBULATORY_CARE_PROVIDER_SITE_OTHER): Payer: Medicare Other | Admitting: Nurse Practitioner

## 2018-12-27 ENCOUNTER — Encounter (INDEPENDENT_AMBULATORY_CARE_PROVIDER_SITE_OTHER): Payer: Medicare Other

## 2019-01-01 ENCOUNTER — Other Ambulatory Visit: Payer: Self-pay

## 2019-01-03 ENCOUNTER — Other Ambulatory Visit: Payer: Self-pay | Admitting: Urology

## 2019-01-03 DIAGNOSIS — N138 Other obstructive and reflux uropathy: Secondary | ICD-10-CM

## 2019-01-07 ENCOUNTER — Other Ambulatory Visit: Payer: Self-pay | Admitting: Urology

## 2019-01-07 ENCOUNTER — Telehealth: Payer: Self-pay | Admitting: Urology

## 2019-01-07 DIAGNOSIS — N401 Enlarged prostate with lower urinary tract symptoms: Secondary | ICD-10-CM

## 2019-01-07 DIAGNOSIS — N138 Other obstructive and reflux uropathy: Secondary | ICD-10-CM

## 2019-01-07 NOTE — Telephone Encounter (Signed)
Pt called states [employer] says the office needs to call in his refill on his Tamsulosin and Finesteride. Pt needs refills. Please advise. Thanks.

## 2019-01-07 NOTE — Telephone Encounter (Signed)
He hasn't been seen in over a year.  He needs an appointment prior to refills.

## 2019-01-08 NOTE — Progress Notes (Signed)
3:44 PM   Antonio [name] 1945-04-04 [id number]  Referring provider: Pleas Koch, NP [address] [place],  [place] [address]  Chief Complaint  Patient presents with  . 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   History of urinary retention His baseline urinary symptoms are frequency, urgency, nocturia, intermittency and weak urinary stream. He is on maximum medical therapy with tamsulosin 0.8 mg daily and finasteride 5 mg daily.  His urinary symptoms seemed to worsen when he consumes alcohol and he is comfortable with CIC.  He is having to CIC a few times a year.  His PVR was 12 mL today.  BPH WITH LUTS  (prostate and/or bladder) IPSS score: 11/2  PVR: 12 mL    Previous score: 16/3  Previous PVR: 51 mL  Major complaint(s): Frequency and nocturia.  He denies any dysuria, hematuria or suprapubic pain.   Currently taking: Tamsulosin 0.4 mg 2 capsules daily and finasteride 5 mg daily  His has had TARGIS ~ 10 years ago   Denies any recent fevers, chills, nausea or vomiting.  He does not have a family history of PCa.  IPSS    Row Name 01/09/19 1400         International Prostate Symptom Score   How often have you had the sensation of not emptying your bladder?  Less than half the time     How often have you had to urinate less than every two hours?  Less than half the time     How often have you found you stopped and started again several times when you urinated?  Less than 1 in 5 times     How often have you found it difficult to postpone urination?  Less than half the time     How often have you had a weak urinary stream?  Less than 1 in 5 times     How often have you had to strain to start urination?  Less than 1 in 5 times     How many times did you  typically get up at night to urinate?  2 Times     Total IPSS Score  11       Quality of Life due to urinary symptoms   If you were to spend the rest of your life with your urinary condition just the way it is now how would you feel about that?  Mostly Satisfied        Score:  1-7 Mild 8-19 Moderate 20-35 Severe     PMH: Past Medical History:  Diagnosis Date  . Arthritis   . 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 Coronary atherosclerosis of unspecified type of vessel, native or graft   . Diabetes mellitus    Type II  . Emphysema of lung ([place])   . Esophageal stricture   . Gastritis   . GERD (gastroesophageal reflux disease)   . Hiatal hernia   . Hx of colonic polyps   . Hyperlipidemia   . Hypertension   . Internal hemorrhoids without mention of complication   . Lumbago   . Pain in both lower legs   . PSA elevation    now  averaging 2's  . Tobacco abuse   . URI (upper respiratory infection)   . Urinary retention     Surgical History: Past Surgical History:  Procedure Laterality Date  . 2D Echo  06/03  . Abdominal ultrasound  06/03   Negative  . BACK SURGERY    . CT of head  and sinuses  06/03   Negative  . CT of the chest, abdomen, pelvis  06/03   Chest negative; Abdomen, left adrenal lesion; Pelvis, enlarged prostate  . ESOPHAGOGASTRODUODENOSCOPY  02/13/07   gastritis and duodenitis without bleed  . ESOPHAGOGASTRODUODENOSCOPY N/A 06/26/2014   Procedure: ESOPHAGOGASTRODUODENOSCOPY (EGD);  Surgeon: Milus Banister, MD;  Location: [facility];  Service: Endoscopy;  Laterality: N/A;  . KNEE ARTHROSCOPY  10/00   Right  . LOWER EXTREMITY ANGIOGRAPHY Right 12/27/2017   Procedure: LOWER EXTREMITY ANGIOGRAPHY;  Surgeon: Algernon Huxley, MD;  Location: [facility];  Service: Cardiovascular;  Laterality: Right;  . LOWER EXTREMITY ANGIOGRAPHY Left 01/10/2018   Procedure: LOWER EXTREMITY ANGIOGRAPHY;  Surgeon: Algernon Huxley, MD;  Location:  [place];  Service: Cardiovascular;  Laterality: Left;  . LOWER EXTREMITY ANGIOGRAPHY Left 05/29/2018   Procedure: LOWER EXTREMITY ANGIOGRAPHY;  Surgeon: Algernon Huxley, MD;  Location: [facility];  Service: Cardiovascular;  Laterality: Left;  . LOWER EXTREMITY ANGIOGRAPHY Left 05/30/2018   Procedure: Lower Extremity Angiography;  Surgeon: Algernon Huxley, MD;  Location: [facility];  Service: Cardiovascular;  Laterality: Left;  . LP  06/03  . Microwave thermotherapy prostate  08/28/07   Rogers Blocker  . Persantine cardiolite  03/01   EF 68%  . POLYPECTOMY  11/95  . Stress myoview  07/06   EF 67%    Home Medications:  Allergies as of 01/09/2019   No Known Allergies     Medication List       Accurate as of January 09, 2019  3:44 PM. If you have any questions, ask your nurse or doctor.        apixaban 5 MG Tabs tablet Commonly known as: Eliquis Take 1 tablet (5 mg total) by mouth 2 (two) times daily.   aspirin 81 MG tablet Take 81 mg by mouth daily.   atorvastatin 20 MG tablet Commonly known as: LIPITOR TAKE 1 TABLET BY MOUTH IN THE EVENING FOR CHOLESTEROL   finasteride 5 MG tablet Commonly known as: PROSCAR Take 1 tablet (5 mg total) by mouth daily.   glipiZIDE 10 MG tablet Commonly known as: GLUCOTROL Take 10 mg by mouth daily before supper.   insulin detemir 100 UNIT/ML injection Commonly known as: Levemir Inject 20 units at bedtime. What changed:   when to take this  additional instructions   [employer] TEST STRIPS test strip Generic drug: glucose blood Use one strip three times daily.   lisinopril-hydrochlorothiazide 20-25 MG tablet Commonly known as: ZESTORETIC Take 1 tablet by mouth once daily for blood pressure   metFORMIN 500 MG tablet Commonly known as: GLUCOPHAGE TAKE 2 TABLETS BY MOUTH TWICE DAILY WITH A MEAL FOR DIABETES.   metoprolol tartrate 50 MG tablet Commonly known as: LOPRESSOR Take 1/2 (one-half) tablet by mouth twice  daily   OVER THE COUNTER MEDICATION Glucosomine one tablet daily.   pantoprazole 40 MG tablet Commonly known as: PROTONIX Take 1 tablet by mouth once daily   tamsulosin 0.4 MG Caps capsule Commonly known as: FLOMAX Take 2 capsules by mouth once daily       Allergies: No  Known Allergies  Family History: Family History  Problem Relation Age of Onset  . Diabetes Father   . Alcohol abuse Paternal Uncle   . Alcohol abuse Paternal Uncle   . Alcohol abuse Paternal Uncle   . Rheum arthritis Mother   . Heart disease Neg Hx   . Stroke Neg Hx   . Cancer Neg Hx   . Drug abuse Neg Hx   . Depression Neg Hx   . Colon cancer Neg Hx   . Rectal cancer Neg Hx   . Stomach cancer Neg Hx   . Kidney cancer Neg Hx   . Bladder Cancer Neg Hx   . Prostate cancer Neg Hx     Social History:  reports that he has been smoking cigarettes. He has a 85.50 pack-year smoking history. He has quit using smokeless tobacco.  His smokeless tobacco use included chew. He reports previous alcohol use. He reports that he does not use drugs.  ROS: UROLOGY Frequent Urination?: Yes Hard to postpone urination?: No Burning/pain with urination?: No Get up at night to urinate?: Yes Leakage of urine?: No Urine stream starts and stops?: No Trouble starting stream?: No Do you have to strain to urinate?: No Blood in urine?: No Urinary tract infection?: No Sexually transmitted disease?: No Injury to kidneys or bladder?: No Painful intercourse?: No Weak stream?: No Erection problems?: No Penile pain?: No Gastrointestinal Nausea?: No Vomiting?: No Indigestion/heartburn?: No Diarrhea?: No Constipation?: No Constitutional Fever: No Night sweats?: No Weight loss?: No Fatigue?: No Skin Skin rash/lesions?: No Itching?: No Eyes Blurred vision?: No Double vision?: No Ears/Nose/Throat Sore throat?: No Sinus problems?: No Hematologic/Lymphatic Swollen glands?: No Easy bruising?: Yes Cardiovascular Leg  swelling?: No Chest pain?: No Respiratory Cough?: Yes Shortness of breath?: No Endocrine Excessive thirst?: No Musculoskeletal Back pain?: Yes Joint pain?: Yes Neurological Headaches?: No Dizziness?: No Psychologic Depression?: No Anxiety?: No   Physical Exam: Blood pressure (!) 168/71, pulse 66, height 5\' 5"  (1.651 m), weight 170 lb (77.1 kg). Constitutional:  Well nourished. Alert and oriented, No acute distress. HEENT: [place] AT, moist mucus membranes.  Trachea midline, no masses. Cardiovascular: No clubbing, cyanosis, or edema. Respiratory: Normal respiratory effort, no increased work of breathing. GI: Abdomen is soft, non tender, non distended, no abdominal masses. Liver and spleen not palpable.  No hernias appreciated.  Stool sample for occult testing is not indicated.   GU: No CVA tenderness.  No bladder fullness or masses.  Patient with circumcised phallus. Urethral meatus is patent.  No penile discharge. No penile lesions or rashes. Scrotum without lesions, cysts, rashes and/or edema.  Testicles are located scrotally bilaterally. No masses are appreciated in the testicles. Left and right epididymis are normal. Rectal: Patient with  normal sphincter tone. Anus and perineum without scarring or rashes. No rectal masses are appreciated. Prostate is approximately 50 grams, nonodules are appreciated. Seminal vesicles could not be palpated Skin: No rashes, bruises or suspicious lesions. Lymph: No  inguinal adenopathy. Neurologic: Grossly intact, no focal deficits, moving all 4 extremities. Psychiatric: Normal mood and affect.   Laboratory Data: Lab Results  Component Value Date   PSA 1.94 08/08/2011   PSA 1.73 04/24/2011   PSA 1.74 11/19/2006  PSA 0.8 (1.6) ng/ml on 10/12/2015 PSA 0.7 (1.4) ng/mL on 10/06/2016 PSA 0.6 (1.2)  in 09/2017  I have reviewed the labs.  Pertinent Imaging: Results for ROLLYN, SCIALDONE (MRN [id number]) as of 01/09/2019 15:38  Ref. Range 01/09/2019  14:58  Scan Result Unknown 12  Assessment & Plan:   1. Urinary retention Resolved Has only need to cath a few times over the last year    2. BPH with LUTS IPSS score is 11/2, it is stable Continue conservative management, avoiding bladder irritants and timed voiding's Continue tamsulosin 0.4 mg twice daily and finasteride 5 mg daily - med's refilled  RTC in 12 months for IPSS, PSA, PVR and exam   Return in about 1 year (around 01/09/2020) for IPSS, PSA and exam.  Zara Council, [facility]  [address] [place], [place] [address] [phone]

## 2019-01-08 NOTE — Telephone Encounter (Signed)
Patient notified and appointment made

## 2019-01-09 ENCOUNTER — Other Ambulatory Visit: Payer: Self-pay

## 2019-01-09 ENCOUNTER — Encounter: Payer: Self-pay | Admitting: Urology

## 2019-01-09 ENCOUNTER — Ambulatory Visit (INDEPENDENT_AMBULATORY_CARE_PROVIDER_SITE_OTHER): Payer: Medicare Other | Admitting: Urology

## 2019-01-09 VITALS — BP 168/71 | HR 66 | Ht 65.0 in | Wt 170.0 lb

## 2019-01-09 DIAGNOSIS — N401 Enlarged prostate with lower urinary tract symptoms: Secondary | ICD-10-CM

## 2019-01-09 DIAGNOSIS — I70229 Atherosclerosis of native arteries of extremities with rest pain, unspecified extremity: Secondary | ICD-10-CM

## 2019-01-09 DIAGNOSIS — N138 Other obstructive and reflux uropathy: Secondary | ICD-10-CM

## 2019-01-09 DIAGNOSIS — Z87898 Personal history of other specified conditions: Secondary | ICD-10-CM

## 2019-01-09 DIAGNOSIS — R319 Hematuria, unspecified: Secondary | ICD-10-CM | POA: Diagnosis not present

## 2019-01-09 LAB — BLADDER SCAN AMB NON-IMAGING: Scan Result: 12

## 2019-01-09 MED ORDER — TAMSULOSIN HCL 0.4 MG PO CAPS: ORAL_CAPSULE | ORAL | 3 refills | Status: DC

## 2019-01-09 MED ORDER — FINASTERIDE 5 MG PO TABS: 5.0000 mg | ORAL_TABLET | Freq: Every day | ORAL | 3 refills | Status: DC

## 2019-01-10 ENCOUNTER — Telehealth: Payer: Self-pay | Admitting: Family Medicine

## 2019-01-10 LAB — PSA: Prostate Specific Ag, Serum: 0.8 ng/mL (ref 0.0–4.0)

## 2019-01-10 NOTE — Telephone Encounter (Signed)
Patient notified and voiced understanding.

## 2019-01-10 NOTE — Telephone Encounter (Signed)
-----   Message from Nori Riis, PA-C sent at 01/10/2019  7:51 AM EST ----- Please let Mr. Delage know that his PSA is good at 0.8.

## 2019-01-15 ENCOUNTER — Other Ambulatory Visit: Payer: Self-pay | Admitting: Urology

## 2019-03-03 DIAGNOSIS — E1142 Type 2 diabetes mellitus with diabetic polyneuropathy: Secondary | ICD-10-CM | POA: Diagnosis not present

## 2019-03-03 DIAGNOSIS — Z794 Long term (current) use of insulin: Secondary | ICD-10-CM | POA: Diagnosis not present

## 2019-03-03 DIAGNOSIS — R809 Proteinuria, unspecified: Secondary | ICD-10-CM | POA: Diagnosis not present

## 2019-03-03 DIAGNOSIS — E1129 Type 2 diabetes mellitus with other diabetic kidney complication: Secondary | ICD-10-CM | POA: Diagnosis not present

## 2019-03-03 DIAGNOSIS — E538 Deficiency of other specified B group vitamins: Secondary | ICD-10-CM | POA: Diagnosis not present

## 2019-03-10 DIAGNOSIS — E1129 Type 2 diabetes mellitus with other diabetic kidney complication: Secondary | ICD-10-CM | POA: Diagnosis not present

## 2019-03-10 DIAGNOSIS — R809 Proteinuria, unspecified: Secondary | ICD-10-CM | POA: Diagnosis not present

## 2019-03-10 DIAGNOSIS — Z794 Long term (current) use of insulin: Secondary | ICD-10-CM | POA: Diagnosis not present

## 2019-03-10 DIAGNOSIS — E1142 Type 2 diabetes mellitus with diabetic polyneuropathy: Secondary | ICD-10-CM | POA: Diagnosis not present

## 2019-03-10 DIAGNOSIS — F17200 Nicotine dependence, unspecified, uncomplicated: Secondary | ICD-10-CM | POA: Diagnosis not present

## 2019-03-10 DIAGNOSIS — E1159 Type 2 diabetes mellitus with other circulatory complications: Secondary | ICD-10-CM | POA: Diagnosis not present

## 2019-03-10 DIAGNOSIS — I10 Essential (primary) hypertension: Secondary | ICD-10-CM | POA: Diagnosis not present

## 2019-05-01 ENCOUNTER — Encounter: Payer: Self-pay | Admitting: Primary Care

## 2019-05-29 ENCOUNTER — Other Ambulatory Visit: Payer: Self-pay | Admitting: Primary Care

## 2019-05-29 DIAGNOSIS — E782 Mixed hyperlipidemia: Secondary | ICD-10-CM

## 2019-05-29 DIAGNOSIS — I10 Essential (primary) hypertension: Secondary | ICD-10-CM

## 2019-06-08 ENCOUNTER — Other Ambulatory Visit (INDEPENDENT_AMBULATORY_CARE_PROVIDER_SITE_OTHER): Payer: Self-pay | Admitting: Vascular Surgery

## 2019-06-09 NOTE — Telephone Encounter (Signed)
One refill but patient will need to be seen for any refills after this.

## 2019-06-09 NOTE — Telephone Encounter (Signed)
Is it ok to refill this medicine the pt was seen last May of last year it was last filled 3/29 of this year by Maudie Mercury.

## 2019-06-23 ENCOUNTER — Telehealth: Payer: Self-pay | Admitting: Primary Care

## 2019-06-23 NOTE — Progress Notes (Signed)
  Chronic Care Management   Note  06/23/2019 Name: Antonio [name] MRN: [id number] DOB: [birth year]  Antonio [name] is a 74 y.o. year old male who is a primary care patient of Pleas Koch, NP. I reached out to [name] by phone today in response to a referral sent by [name]'s PCP, Pleas Koch, NP.   [name] was given information about Chronic Care Management services today including:  1. CCM service includes personalized support from designated clinical staff supervised by his physician, including individualized plan of care and coordination with other care providers 2. 24/7 contact phone numbers for assistance for urgent and routine care needs. 3. Service will only be billed when office clinical staff spend 20 minutes or more in a month to coordinate care. 4. Only one practitioner may furnish and bill the service in a calendar month. 5. The patient may stop CCM services at any time (effective at the end of the month) by phone call to the office staff.   Patient agreed to services and verbal consent obtained.  This note is not being shared with the patient for the following reason: To respect privacy (The patient or proxy has requested that the information not be shared). Follow up plan:   Earney [place] Upstream Scheduler

## 2019-06-30 ENCOUNTER — Other Ambulatory Visit: Payer: Self-pay | Admitting: Primary Care

## 2019-06-30 DIAGNOSIS — K2100 Gastro-esophageal reflux disease with esophagitis, without bleeding: Secondary | ICD-10-CM

## 2019-07-09 ENCOUNTER — Ambulatory Visit: Payer: Medicare Other | Attending: Critical Care Medicine

## 2019-07-09 DIAGNOSIS — Z23 Encounter for immunization: Secondary | ICD-10-CM

## 2019-07-09 NOTE — Progress Notes (Signed)
   Covid-19 Vaccination Clinic  Name:  Antonio [name]    MRN: [id number] DOB: 08-Dec-1945  07/09/2019  Mr. [name] was observed post Covid-19 immunization for 15 minutes without incident. He was provided with Vaccine Information Sheet and instruction to access the V-Safe system.   Mr. [name] was instructed to call 911 with any severe reactions post vaccine: [name] Difficulty breathing  . Swelling of face and throat  . A fast heartbeat  . A bad rash all over body  . Dizziness and weakness   Immunizations Administered    Name Date Dose VIS Date Route   Moderna COVID-19 Vaccine 07/09/2019 10:09 AM 0.5 mL 01/2019 Intramuscular   Manufacturer: Moderna   Lot: [id number]   Taylor: 43539-122-58

## 2019-07-14 DIAGNOSIS — E1142 Type 2 diabetes mellitus with diabetic polyneuropathy: Secondary | ICD-10-CM | POA: Diagnosis not present

## 2019-07-21 DIAGNOSIS — I10 Essential (primary) hypertension: Secondary | ICD-10-CM | POA: Diagnosis not present

## 2019-07-21 DIAGNOSIS — E1159 Type 2 diabetes mellitus with other circulatory complications: Secondary | ICD-10-CM | POA: Diagnosis not present

## 2019-07-21 DIAGNOSIS — E1142 Type 2 diabetes mellitus with diabetic polyneuropathy: Secondary | ICD-10-CM | POA: Diagnosis not present

## 2019-07-21 DIAGNOSIS — R809 Proteinuria, unspecified: Secondary | ICD-10-CM | POA: Diagnosis not present

## 2019-07-21 DIAGNOSIS — Z794 Long term (current) use of insulin: Secondary | ICD-10-CM | POA: Diagnosis not present

## 2019-07-21 DIAGNOSIS — E782 Mixed hyperlipidemia: Secondary | ICD-10-CM | POA: Diagnosis not present

## 2019-07-21 DIAGNOSIS — E1129 Type 2 diabetes mellitus with other diabetic kidney complication: Secondary | ICD-10-CM | POA: Diagnosis not present

## 2019-07-21 DIAGNOSIS — F17200 Nicotine dependence, unspecified, uncomplicated: Secondary | ICD-10-CM | POA: Diagnosis not present

## 2019-07-31 NOTE — Chronic Care Management (AMB) (Deleted)
Chronic Care Management Pharmacy  Name: Antonio [name]  MRN: [id number] DOB: 06/26/1945  Chief Complaint/ HPI  Antonio [name],  74 y.o. , male presents for their {Initial/Follow-up:3041532} CCM visit with the clinical pharmacist {CHL HP Upstream Pharm visit AGTX:[id number]}.  PCP : Pleas Koch, NP  Their chronic conditions include: HTN, HLD, type 2 diabetes, PAD s/p angioplasty and stents, tobacco dependence  *** 5/17 consent  Office Visits: 09/30/18 OV - office visit screening for colon cancer.  Consult Visit: 07/21/19 OV ([facility], Endo) [facility] - diabetes remains controlled. No changes with meds. F/u in 6 months  Medications: Outpatient Encounter Medications as of 08/04/2019  Medication Sig  . aspirin 81 MG tablet Take 81 mg by mouth daily.  [name] atorvastatin (LIPITOR) 20 MG tablet TAKE 1 TABLET BY MOUTH EVERY EVENING FOR CHOLESTEROL  . ELIQUIS 5 MG TABS tablet TAKE 1 TABLET BY MOUTH TWICE A DAY  . finasteride (PROSCAR) 5 MG tablet Take 1 tablet (5 mg total) by mouth daily.  [name] glipiZIDE (GLUCOTROL) 10 MG tablet Take 10 mg by mouth daily before supper.   [name] glucose blood ([employer] TEST STRIPS) test strip Use one strip three times daily.  . insulin detemir (LEVEMIR) 100 UNIT/ML injection Inject 20 units at bedtime. (Patient taking differently: every morning. Inject 20 units at breakfast)  . lisinopril-hydrochlorothiazide (ZESTORETIC) 20-25 MG tablet TAKE 1 TABLET BY MOUTH EVERY DAY FOR BLOOD PRESSURE  . metFORMIN (GLUCOPHAGE) 500 MG tablet TAKE 2 TABLETS BY MOUTH TWICE DAILY WITH A MEAL FOR DIABETES.  . metoprolol tartrate (LOPRESSOR) 50 MG tablet Take 1/2 (one-half) tablet by mouth twice daily  . OVER THE COUNTER MEDICATION Glucosomine one tablet daily.  . pantoprazole (PROTONIX) 40 MG tablet TAKE 1 TABLET BY MOUTH EVERY DAY  . tamsulosin (FLOMAX) 0.4 MG CAPS capsule Take 2 capsules by mouth once daily   No facility-administered encounter medications on file as of 08/04/2019.      Current Diagnosis/Assessment:  Goals Addressed   None      Hypertension   BP today is:  {CHL HP UPSTREAM Pharmacist BP [phone]}  Office blood pressures are  BP Readings from Last 3 Encounters:  01/09/19 (!) 168/71  10/22/18 (!) 112/57  09/30/18 136/62    Patient has failed these meds in the past: ramipril Patient is currently {CHL Controlled/Uncontrolled:[phone]} on the following medications:  . Lisinopril/HCTZ 20/25 mg 1 tablet daily . Metoprolol tartrate 50 mg 0.5 tablet twice daily  Patient checks BP at home {CHL HP BP Monitoring Frequency:[phone]}  Patient home BP readings are ranging: ***  We discussed {CHL HP Upstream Pharmacy discussion:[phone]}  Plan  Continue {CHL HP Upstream Pharmacy Plans:[id number]}      Hyperlipidemia   Lipid Panel     Component Value Date/Time   CHOL 118 09/24/2018 0819   TRIG 153.0 (H) 09/24/2018 0819   HDL 36.70 (L) 09/24/2018 0819   CHOLHDL 3 09/24/2018 0819   VLDL 30.6 09/24/2018 0819   LDLCALC 51 09/24/2018 0819   LDLDIRECT 120.3 04/24/2011 1514     The ASCVD Risk score (Goff DC Jr., et al., 2013) failed to calculate for the following reasons:   The valid total cholesterol range is 130 to 320 mg/dL   Patient has failed these meds in past: none Patient is currently {CHL Controlled/Uncontrolled:[phone]} on the following medications:  . Atorvastatin 20 mg 1 tablet in the evening  We discussed:  {CHL HP Upstream Pharmacy discussion:[phone]}  Plan  Continue {CHL HP Upstream Pharmacy QMGNO:[id number]}  Diabetes   Recent Relevant Labs: Lab Results  Component Value Date/Time   HGBA1C 7.6 (H) 09/24/2018 08:19 AM   HGBA1C 6.8 (A) 09/14/2017 08:30 AM   HGBA1C 7.6 03/16/2017 12:00 AM   HGBA1C 8.9 (H) 07/12/2016 09:43 AM   GFR 63.48 09/24/2018 08:19 AM   GFR 108.65 06/27/2017 08:42 AM   MICROALBUR 6.8 (H) 12/23/2014 08:08 AM   MICROALBUR 1.3 11/12/2007 09:24 AM     Checking BG: {CHL  HP Blood Glucose Monitoring Frequency:8597692389}  Recent FBG Readings: *** Recent pre-meal BG readings: *** Recent 2hr PP BG readings:  *** Recent HS BG readings: ***  Patient has failed these meds in past: pioglitazone Patient is currently {CHL Controlled/[phone]} on the following medications:  [name] Glipizide 10 mg 1 tablet daily before supper . Levemir 100 unit/ml inject 20 units at breakfast . Metformin 500 mg 2 tablets twice daily with a meal  Last diabetic Eye exam:  Lab Results  Component Value Date/Time   HMDIABEYEEXA No Retinopathy 08/31/2017 10:06 AM    Last diabetic Foot exam:  Lab Results  Component Value Date/Time   HMDIABFOOTEX Yes 11/12/2007 12:00 AM     We discussed: {CHL HP Upstream Pharmacy discussion:[phone]}  Plan  Continue {CHL HP Upstream Pharmacy Plans:[phone]}   GERD   Patient has failed these meds in past: esomeprazole, Patient is currently {CHL Controlled/[phone]} on the following medications:  . Pantoprazole 40 mg 1 tablet daily  We discussed:  ***  Plan  Continue {CHL HP Upstream Pharmacy Plans:[phone]}   BPH with obstruction/lower urinary tract symptoms   Patient has failed these meds in past: none Patient is currently {CHL Controlled/[phone]} on the following medications:  . Finasteride 5 mg 1 tablet daily . Tamsulosin 0.4 mg 2 capsules daily  We discussed:  ***  Plan  Continue {CHL HP Upstream Pharmacy JMEQA:[id number]}    PAD s/p angioplasty and stents   Patient has failed these meds in past: clopidogrel Patient is currently {CHL Controlled/[phone]} on the following medications:  . Aspirin 81 mg 1 tablet daily . Eliquis 5 mg 1 tablet twice daily  We discussed:  ***  Plan  Continue {CHL HP Upstream Pharmacy NLGXQ:[id number]}   OTCs/Health Maintenance   Patient is currently {CHL Controlled/[phone]} on the following  medications: [name] Glucosamine 1 tablet daily *** . [employer] test strips use 3 times daily  We discussed:  ***  Plan  Continue {CHL HP Upstream Pharmacy KGYJE:[id number]}   Vaccines   Reviewed and discussed patient's vaccination history.    Immunization History  Administered Date(s) Administered  . Moderna SARS-COVID-2 Vaccination 07/09/2019  . Pneumococcal Conjugate-13 09/30/2018  . Td 10/08/1994, 11/19/2006    Plan  Recommended patient receive *** vaccine in *** office.    Medication Management   Pharmacy/Benefits: [facility] Adherence:  Pt endorses ***% compliance  We discussed: ***  Plan  {US Pharmacy VZCH:88502}   Follow up: *** month phone visit  Timer :  CPP Chart prep 12:58:44 13:22:30 0:23:46

## 2019-08-04 ENCOUNTER — Other Ambulatory Visit: Payer: Self-pay

## 2019-08-04 ENCOUNTER — Ambulatory Visit: Payer: Medicare Other

## 2019-08-04 ENCOUNTER — Telehealth: Payer: Self-pay

## 2019-08-04 DIAGNOSIS — Z794 Long term (current) use of insulin: Secondary | ICD-10-CM

## 2019-08-04 DIAGNOSIS — I10 Essential (primary) hypertension: Secondary | ICD-10-CM

## 2019-08-04 DIAGNOSIS — E785 Hyperlipidemia, unspecified: Secondary | ICD-10-CM

## 2019-08-04 NOTE — Patient Instructions (Signed)
August 04, 2019  Dear [name],  It was a pleasure meeting you during our initial appointment on August 04, 2019. Below is a summary of the goals we discussed and components of chronic care management. Please contact me anytime with questions or concerns.   Visit Information  Goals Addressed            This Visit's Progress   . Chronic Care Management       CARE PLAN ENTRY (see longitudinal plan of care for additional care plan information)  Current Barriers:  . Chronic Disease Management support, education, and care coordination needs related to Hypertension, Hyperlipidemia, Diabetes, and Peripheral Artery Disease   Hypertension BP Readings from Last 3 Encounters:  01/09/19 (!) 168/71  10/22/18 (!) 112/57  09/30/18 136/62 .  Pharmacist Clinical Goal(s): o Over the next 6 months, patient will work with PharmD and providers to maintain BP goal <140/90 mmHg . Current regimen:  . Lisinopril/HCTZ 20/25 mg - 1 tablet daily . Metoprolol tartrate 50 mg - 1/2 tablet twice daily . Amlodipine 5 mg - 1 tablet daily . Interventions: o Recommend exercise and low sodium diet . Patient self care activities - Over the next 6 months, patient will: o Call if office visit blood pressure readings are elevated o Ensure daily salt intake < 2300 mg/day  Hyperlipidemia Lab Results  Component Value Date/Time   LDLCALC 51 09/24/2018 08:19 AM   LDLDIRECT 120.3 04/24/2011 03:14 PM .  Pharmacist Clinical Goal(s): o Over the next 6 months, patient will work with PharmD and providers to maintain LDL goal < 70 . Current regimen:  o Atorvastatin 20 mg - 1 tablet daily  . Interventions: o Reviewed adherence, > 5 day gap between last refill . Patient self care activities - Over the next 6 month, patient will: o Consider using pillbox/pill planner to avoid missing doses   Diabetes A1c reported < 7% June 2021 per patient Lab Results  Component Value Date/Time   HGBA1C 7.6 (H) 09/24/2018 08:19 AM    HGBA1C 6.8 (A) 09/14/2017 08:30 AM   HGBA1C 7.6 03/16/2017 12:00 AM   HGBA1C 8.9 (H) 07/12/2016 09:43 AM .  Pharmacist Clinical Goal(s): o Over the next 6 months, patient will work with PharmD and providers to maintain A1c goal <7% . Current regimen:  [name] Glipizide 10 mg - 1 tablet daily before supper . Levemir 100 unit/ml - Inject 15 units at breakfast . Metformin 500 mg - 2 tablets twice daily with a meal . Interventions: o Recommend annual comprehensive eye exam  . Patient self care activities - Over the next 6 months, patient will: o Check blood sugar in the morning before eating or drinking, document, and provide at future appointments o Contact provider with any episodes of hypoglycemia o Schedule eye exam   Peripheral Artery Disease . Pharmacist Clinical Goal(s) o Over the next 6 months, patient will work with PharmD and providers to ensure safe medication use . Current regimen:  . Aspirin 81 mg - 1 tablet daily . Eliquis 5 mg - 1 tablet twice daily . Interventions: o Recommend following up with Dr. Leotis Pain o Reviewed labs  . Patient self care activities - Over the next 6 months, patient will: o Schedule follow up visit with Dr. Lucky Cowboy  Vaccines: Recommend shingles vaccines (Shingrix) from local pharmacy.  Initial goal documentation       Mr. Geimer was given information about Chronic Care Management services today including:  1. CCM service includes  personalized support from designated clinical staff supervised by his physician, including individualized plan of care and coordination with other care providers 2. 24/7 contact phone numbers for assistance for urgent and routine care needs. 3. Standard insurance, coinsurance, copays and deductibles apply for chronic care management only during months in which we provide at least 20 minutes of these services. Most insurances cover these services at 100%, however patients may be responsible for any copay, coinsurance and/or  deductible if applicable. This service may help you avoid the need for more expensive face-to-face services. 4. Only one practitioner may furnish and bill the service in a calendar month. 5. The patient may stop CCM services at any time (effective at the end of the month) by phone call to the office staff.  Patient agreed to services and verbal consent obtained.   The patient verbalized understanding of instructions provided today and agreed to receive a mailed copy of patient instruction and/or educational materials. Telephone follow up appointment with pharmacy team member scheduled for: 02/04/20 at 1:00 PM (telephone)  [name], PharmD Clinical Pharmacist [facility] at [facility] [phone]  Zoster Vaccine, Recombinant injection What is this medicine? ZOSTER VACCINE (ZOS ter vak SEEN) is used to prevent shingles in adults 75 years old and over. This vaccine is not used to treat shingles or nerve pain from shingles. This medicine may be used for other purposes; ask your health care provider or pharmacist if you have questions. COMMON BRAND NAME(S): [facility] What should I tell my health care provider before I take this medicine? They need to know if you have any of these conditions:  blood disorders or disease  cancer like leukemia or lymphoma  immune system problems or therapy  an unusual or allergic reaction to vaccines, other medications, foods, dyes, or preservatives  pregnant or trying to get pregnant  breast-feeding How should I use this medicine? This vaccine is for injection in a muscle. It is given by a health care professional. Talk to your pediatrician regarding the use of this medicine in children. This medicine is not approved for use in children. Overdosage: If you think you have taken too much of this medicine contact a poison control center or emergency room at once. NOTE: This medicine is only for you. Do not share this medicine with  others. What if I miss a dose? Keep appointments for follow-up (booster) doses as directed. It is important not to miss your dose. Call your doctor or health care professional if you are unable to keep an appointment. What may interact with this medicine?  medicines that suppress your immune system  medicines to treat cancer  steroid medicines like prednisone or cortisone This list may not describe all possible interactions. Give your health care provider a list of all the medicines, herbs, non-prescription drugs, or dietary supplements you use. Also tell them if you smoke, drink alcohol, or use illegal drugs. Some items may interact with your medicine. What should I watch for while using this medicine? Visit your doctor for regular check ups. This vaccine, like all vaccines, may not fully protect everyone. What side effects may I notice from receiving this medicine? Side effects that you should report to your doctor or health care professional as soon as possible:  allergic reactions like skin rash, itching or hives, swelling of the face, lips, or tongue  breathing problems Side effects that usually do not require medical attention (report these to your doctor or health care professional if they continue  or are bothersome):  chills  headache  fever  nausea, vomiting  redness, warmth, pain, swelling or itching at site where injected  tiredness This list may not describe all possible side effects. Call your doctor for medical advice about side effects. You may report side effects to FDA at 1-800-FDA-1088. Where should I keep my medicine? This vaccine is only given in a clinic, pharmacy, doctor's office, or other health care setting and will not be stored at home. NOTE: This sheet is a summary. It may not cover all possible information. If you have questions about this medicine, talk to your doctor, pharmacist, or health care provider.  2020 Elsevier/Gold Standard (2016-09-04  13:20:30)

## 2019-08-04 NOTE — Telephone Encounter (Signed)
Per written referral from PCP, requesting referral in Epic for Antonio [name] to chronic care management pharmacy services for the following conditions:   Essential hypertension, benign  [I10]  Hyperlipidemia [E78.5]  [name], PharmD Clinical Pharmacist [facility] at [facility] [phone]

## 2019-08-04 NOTE — Chronic Care Management (AMB) (Signed)
Chronic Care Management Pharmacy  Name: Antonio [name]  MRN: [id number] DOB: May 22, 1945  Chief Complaint/ HPI  Antonio [name],  74 y.o. , male presents for their Initial CCM visit with the clinical pharmacist via telephone.  PCP : Pleas Koch, NP  Their chronic conditions include: HTN, HLD, type 2 diabetes, PAD s/p angioplasty and stents, tobacco dependence, GERD, BPH  Patient concerns: denies medication concerns  Office Visits:  09/30/18: PCP visit - ready to quit smoking, will try OTC products, referral placed for lung cancer screening, compliant to statin, LDL controlled, DM followed by endocrinology, stable on PPI for GERD, followed with urology annually for BPH  Consult Visit:  07/21/19: Endocrinolgoy - diabetes remains controlled, A1c 7.4% with fasting BG in range. Check BG daily. F/u in 6 months  03/10/19: Eye exam  01/09/19: Urology - BPH, symptoms worsen with alcohol; IPSS score is 11/2, stable; Continue conservative management, avoiding bladder irritants and timed voiding; Continue tamsulosin 0.4 mg twice daily and finasteride 5 mg daily - meds refilled   10/22/18: Endoscopy - no abnormal findings, GERD with dysphagia, continue current medications   10/03/18: Lung cancer screening; smoking 1 and 1/2 PPD, ready to quit wants to discuss with PCP;   Medications: Outpatient Encounter Medications as of 08/04/2019  Medication Sig  . amLODipine (NORVASC) 5 MG tablet Take by mouth.  [name] aspirin 81 MG tablet Take 81 mg by mouth daily.  [name] atorvastatin (LIPITOR) 20 MG tablet TAKE 1 TABLET BY MOUTH EVERY EVENING FOR CHOLESTEROL  . ELIQUIS 5 MG TABS tablet TAKE 1 TABLET BY MOUTH TWICE A DAY  . finasteride (PROSCAR) 5 MG tablet Take 1 tablet (5 mg total) by mouth daily.  [name] glipiZIDE (GLUCOTROL) 10 MG tablet Take 10 mg by mouth daily before supper.   [name] glucose blood ([employer] TEST STRIPS) test strip Use one strip three times daily.  . insulin detemir (LEVEMIR) 100 UNIT/ML injection  Inject 20 units at bedtime. (Patient taking differently: every morning. Inject 15 units at breakfast)  . lisinopril-hydrochlorothiazide (ZESTORETIC) 20-25 MG tablet TAKE 1 TABLET BY MOUTH EVERY DAY FOR BLOOD PRESSURE  . metFORMIN (GLUCOPHAGE) 500 MG tablet TAKE 2 TABLETS BY MOUTH TWICE DAILY WITH A MEAL FOR DIABETES.  . metoprolol tartrate (LOPRESSOR) 50 MG tablet Take 1/2 (one-half) tablet by mouth twice daily  . OVER THE COUNTER MEDICATION Glucosomine one tablet daily.  . pantoprazole (PROTONIX) 40 MG tablet TAKE 1 TABLET BY MOUTH EVERY DAY  . tamsulosin (FLOMAX) 0.4 MG CAPS capsule Take 2 capsules by mouth once daily   No facility-administered encounter medications on file as of 08/04/2019.   Current Diagnosis/Assessment:  SDOH: No medication cost concerns   Goals Addressed            This Visit's Progress   . Chronic Care Management       CARE PLAN ENTRY (see longitudinal plan of care for additional care plan information)  Current Barriers:  . Chronic Disease Management support, education, and care coordination needs related to Hypertension, Hyperlipidemia, Diabetes, and Peripheral Artery Disease   Hypertension BP Readings from Last 3 Encounters:  01/09/19 (!) 168/71  10/22/18 (!) 112/57  09/30/18 136/62 .  Pharmacist Clinical Goal(s): o Over the next 6 months, patient will work with PharmD and providers to maintain BP goal <140/90 mmHg . Current regimen:  . Lisinopril/HCTZ 20/25 mg - 1 tablet daily . Metoprolol tartrate 50 mg - 1/2 tablet twice daily . Amlodipine 5 mg - 1 tablet daily .  Interventions: o Recommend exercise and low sodium diet . Patient self care activities - Over the next 6 months, patient will: o Call if office visit blood pressure readings are elevated o Ensure daily salt intake < 2300 mg/day  Hyperlipidemia Lab Results  Component Value Date/Time   LDLCALC 51 09/24/2018 08:19 AM   LDLDIRECT 120.3 04/24/2011 03:14 PM .  Pharmacist Clinical  Goal(s): o Over the next 6 months, patient will work with PharmD and providers to maintain LDL goal < 70 . Current regimen:  o Atorvastatin 20 mg - 1 tablet daily  . Interventions: o Reviewed adherence, > 5 day gap between last refill . Patient self care activities - Over the next 6 month, patient will: o Consider using pillbox/pill planner to avoid missing doses   Diabetes A1c reported < 7% June 2021 per patient Lab Results  Component Value Date/Time   HGBA1C 7.6 (H) 09/24/2018 08:19 AM   HGBA1C 6.8 (A) 09/14/2017 08:30 AM   HGBA1C 7.6 03/16/2017 12:00 AM   HGBA1C 8.9 (H) 07/12/2016 09:43 AM .  Pharmacist Clinical Goal(s): o Over the next 6 months, patient will work with PharmD and providers to maintain A1c goal <7% . Current regimen:  [name] Glipizide 10 mg - 1 tablet daily before supper . Levemir 100 unit/ml - Inject 15 units at breakfast . Metformin 500 mg - 2 tablets twice daily with a meal . Interventions: o Recommend annual comprehensive eye exam  . Patient self care activities - Over the next 6 months, patient will: o Check blood sugar in the morning before eating or drinking, document, and provide at future appointments o Contact provider with any episodes of hypoglycemia o Schedule eye exam   Peripheral Artery Disease . Pharmacist Clinical Goal(s) o Over the next 6 months, patient will work with PharmD and providers to ensure safe medication use . Current regimen:  . Aspirin 81 mg - 1 tablet daily . Eliquis 5 mg - 1 tablet twice daily . Interventions: o Recommend following up with Dr. Leotis Pain o Reviewed labs  . Patient self care activities - Over the next 6 months, patient will: o Schedule follow up visit with Dr. Lucky Cowboy  Vaccines: Recommend shingles vaccines (Shingrix) from local pharmacy.  Initial goal documentation      Hypertension   CMP Latest Ref Rng & Units 09/24/2018 05/31/2018 05/30/2018  Glucose 70 - 99 mg/dL 154(H) 195(H) 160(H)  BUN 6 - 23 mg/dL 30(H)  17 17  Creatinine 0.40 - 1.50 mg/dL 1.13 0.91 0.64  Sodium 135 - 145 mEq/L 139 136 137  Potassium 3.5 - 5.1 mEq/L 4.3 3.9 3.9  Chloride 96 - 112 mEq/L 104 103 104  CO2 19 - 32 mEq/L 25 26 25   Calcium 8.4 - 10.5 mg/dL 9.5 8.4(L) 8.4(L)  Total Protein 6.0 - 8.3 g/dL 6.6 - -  Total Bilirubin 0.2 - 1.2 mg/dL 0.4 - -  Alkaline Phos 39 - 117 U/L 52 - -  AST 0 - 37 U/L 9 - -  ALT 0 - 53 U/L 9 - -   Office blood pressures are  BP Readings from Last 3 Encounters:  01/09/19 (!) 168/71  10/22/18 (!) 112/57  09/30/18 136/62   Patient has tried these meds in the past: ramipril Patient is currently controlled on the following medications:  . Lisinopril/HCTZ 20-25 mg - 1 tablet daily . Metoprolol tartrate 50 mg - 1/2 tablet twice daily . Amlodipine 5 mg - 1 tablet daily  Patient checks BP at  home: none, does not have monitor Patient home BP readings are ranging: none reported Adherence: < 5 day gap between refills  We discussed: BP was checked recently at Dr. Joycie Peek office (07/21/19 - 104/56 mmHg) and within goal  Plan: Continue current medications   Hyperlipidemia   Lipid Panel     Component Value Date/Time   CHOL 118 09/24/2018 0819   TRIG 153.0 (H) 09/24/2018 0819   HDL 36.70 (L) 09/24/2018 0819   CHOLHDL 3 09/24/2018 0819   VLDL 30.6 09/24/2018 0819   LDLCALC 51 09/24/2018 0819   LDLDIRECT 120.3 04/24/2011 1514    LDL goal < 70 (PAD, CAD) Patient has failed these meds in past: none Patient is currently controlled on the following medications:  . Atorvastatin 20 mg - 1 tablet in the evening  Adherence: > 5 day gap between refills   Plan: Continue current medications; Encourage pillbox for daily reminder to take medications.  Diabetes   Recent Relevant Labs:  Most recent A1c 7.4% per endo note Pt reports A1c was < 7% when checked several weeks ago at endocrinology Lab Results  Component Value Date/Time   HGBA1C 7.6 (H) 09/24/2018 08:19 AM   HGBA1C 6.8 (A) 09/14/2017  08:30 AM   HGBA1C 7.6 03/16/2017 12:00 AM   HGBA1C 8.9 (H) 07/12/2016 09:43 AM   GFR 63.48 09/24/2018 08:19 AM   GFR 108.65 06/27/2017 08:42 AM   MICROALBUR 6.8 (H) 12/23/2014 08:08 AM   MICROALBUR 1.3 11/12/2007 09:24 AM    Checking BG: Daily 3x/week Recent FBG Readings: 100 or lower   Patient has failed these meds in past: pioglitazone Patient is currently controlled on the following medications:  [name] Glipizide 10 mg - 1 tablet daily before supper . Levemir 100 unit/ml - Inject 15 units at breakfast (cut back to 15 units 1 year ago) . Metformin 500 mg - 2 tablets twice daily with a meal  Last diabetic Eye exam:  Lab Results  Component Value Date/Time   HMDIABEYEEXA No Retinopathy 08/31/2017 10:06 AM    Last diabetic Foot exam: Checks at every visit with Dr. Gabriel Carina, has some numbness in feet, no pain   Adherence: med list states 20 units Levemir, pt reports only taking 15 units for past year; metformin and glipizide refills timely   Plan: Continue current medications; Due for eye exam. Update Levemir dose on med list.  GERD   Patient has failed these meds in past: esomeprazole Patient is currently controlled on the following medications:  . Pantoprazole 40 mg 1 tablet daily  We discussed: drinks a lot of water to help with difficulty swallowing; denies chest pain or acid reflux  Plan: Continue current medications  BPH with Obstruction/LUTS   Patient has failed these meds in past: none Patient is currently controlled on the following medications:  . Finasteride 5 mg - 1 tablet daily . Tamsulosin 0.4 mg - 2 capsules daily  We discussed: reports working well, managed by urology  Plan: Continue current medications   PAD s/p angioplasty and stents   CBC Latest Ref Rng & Units 09/24/2018 05/31/2018 05/30/2018  WBC 4.0 - 10.5 K/uL 10.4 10.1 10.5  Hemoglobin 13.0 - 17.0 g/dL 13.5 12.6(L) 13.4  Hematocrit 39 - 52 % 40.3 36.9(L) 39.6  Platelets 150 - 400 K/uL 254.0 223 226    Patient has tried these meds in past: clopidogrel Patient is currently on the following medications:  . Aspirin 81 mg - 1 tablet daily . Eliquis 5 mg - 1 tablet  twice daily  Recent history: Started Eliquis April 2020 for PAD following lower extremity angiography, unclear intended duration, last saw vascular surgery 06/25/18 with plan for 6 month follow up, pt has not been seen since then  We discussed: Encouraged tobacco cessation and follow up with Dr. Lucky Cowboy   Plan: Continue current medications; Recommend follow up with vein and vascular.    OTCs/Misc.   Patient is currently on the following medications:  Glucosamine - 1 tablet daily twice daily (taking for hip and joint pain)  We discussed:  Denies additional OTCs including NSAIDs  Plan: Continue current medications   Vaccines   Reviewed and discussed patient's vaccination history. Declines flu vaccine. Reports second dose of COVID vaccine this week.  Immunization History  Administered Date(s) Administered  . Moderna SARS-COVID-2 Vaccination 07/09/2019  . Pneumococcal Conjugate-13 09/30/2018  . Td 10/08/1994, 11/19/2006   Plan: Recommended patient receive Shingrix.   Medication Management   Pharmacy/Benefits: [facility]  Adherence: > 5 day gap between refills on atorvastatin  We discussed: [facility] by insurance   Plan Continue current medication management strategy. Recommend using a pillbox to help improve adherence.   Follow up: 6 month phone visit   [name], PharmD Clinical Pharmacist [facility] at [facility] [phone]

## 2019-08-05 NOTE — Telephone Encounter (Signed)
Noted. Referral sent

## 2019-08-06 ENCOUNTER — Ambulatory Visit: Payer: Medicare Other | Attending: Internal Medicine

## 2019-08-06 DIAGNOSIS — Z23 Encounter for immunization: Secondary | ICD-10-CM

## 2019-08-06 NOTE — Progress Notes (Signed)
   Covid-19 Vaccination Clinic  Name:  Antonio [name]    MRN: [id number] DOB: 1945/03/02  08/06/2019  [name] was observed post Covid-19 immunization for 15 minutes without incident. He was provided with Vaccine Information Sheet and instruction to access the V-Safe system.   [name] was instructed to call 911 with any severe reactions post vaccine: [name] Difficulty breathing  . Swelling of face and throat  . A fast heartbeat  . A bad rash all over body  . Dizziness and weakness   Immunizations Administered    Name Date Dose VIS Date Route   Moderna COVID-19 Vaccine 08/06/2019  9:25 AM 0.5 mL 01/2019 Intramuscular   Manufacturer: Moderna   Lot: [id number]   [place]: [id number]

## 2019-08-07 NOTE — Progress Notes (Signed)
I have collaborated with the care management provider regarding care management and care coordination activities outlined in this encounter and have reviewed this encounter including documentation in the note and care plan. I am certifying that I agree with the content of this note and encounter as supervising provider.

## 2019-09-16 ENCOUNTER — Telehealth: Payer: Self-pay | Admitting: *Deleted

## 2019-09-16 NOTE — Telephone Encounter (Signed)
Writer phoned patient on this date to discuss patient's annual lung cancer screening CT scan. Patient stated that he still smokes and does less than a ppd. Patient confirmed that he has not had any health issues or insurance changes over the last year and is not being evaluated or treated for cancer. CT scan has been scheduled for 10-08-19 at 11:45 and patient is aware of appt.

## 2019-09-17 ENCOUNTER — Other Ambulatory Visit: Payer: Self-pay | Admitting: *Deleted

## 2019-09-17 DIAGNOSIS — Z122 Encounter for screening for malignant neoplasm of respiratory organs: Secondary | ICD-10-CM

## 2019-09-17 DIAGNOSIS — Z87891 Personal history of nicotine dependence: Secondary | ICD-10-CM

## 2019-09-22 ENCOUNTER — Other Ambulatory Visit: Payer: Self-pay | Admitting: Primary Care

## 2019-09-22 DIAGNOSIS — K2100 Gastro-esophageal reflux disease with esophagitis, without bleeding: Secondary | ICD-10-CM

## 2019-10-08 ENCOUNTER — Other Ambulatory Visit: Payer: Self-pay

## 2019-10-08 ENCOUNTER — Other Ambulatory Visit: Payer: Self-pay | Admitting: Primary Care

## 2019-10-08 ENCOUNTER — Ambulatory Visit
Admission: RE | Admit: 2019-10-08 | Discharge: 2019-10-08 | Disposition: A | Discharge status: Home / Self Care | Payer: Medicare Other | Source: Ambulatory Visit | Attending: Nurse Practitioner | Admitting: Nurse Practitioner

## 2019-10-08 DIAGNOSIS — Z122 Encounter for screening for malignant neoplasm of respiratory organs: Secondary | ICD-10-CM

## 2019-10-08 DIAGNOSIS — I10 Essential (primary) hypertension: Secondary | ICD-10-CM

## 2019-10-08 DIAGNOSIS — F17210 Nicotine dependence, cigarettes, uncomplicated: Secondary | ICD-10-CM | POA: Diagnosis not present

## 2019-10-08 DIAGNOSIS — Z87891 Personal history of nicotine dependence: Secondary | ICD-10-CM | POA: Diagnosis not present

## 2019-10-12 ENCOUNTER — Telehealth: Payer: Self-pay | Admitting: Primary Care

## 2019-10-12 DIAGNOSIS — R9389 Abnormal findings on diagnostic imaging of other specified body structures: Secondary | ICD-10-CM

## 2019-10-12 NOTE — Telephone Encounter (Addendum)
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 We need to further evaluate, is he willing to see oncology and/or move forward with further imaging? I recommend it.  [name], would this be an oncology referral? What other imaging would you recommend? I am happy to take over, just not sure which direction to start.   ----- Message from [name], NP sent at 10/08/2019  1:49 PM EDT ----- [name]! Looks concerning for something in the esophagus. Did you want to manage additional work up and discuss with him or need me to? I saw him for lung cancer screening which is negative, repeat in 12 months. Thanks!

## 2019-10-14 NOTE — Telephone Encounter (Signed)
So speak to patient about GI referral?

## 2019-10-14 NOTE — Telephone Encounter (Signed)
Yes please. Thank you

## 2019-10-14 NOTE — Telephone Encounter (Signed)
Antonio [name]  I wouldn't recommend oncology just yet. It certainly could just be reflux causing the thickening but he needs an EGD to evaluate it. I'd recommend referral to GI for the EGD and let them chat with him about symptoms as well. Wouldn't recommend any other imaging at this time. Just EGD. If he ends up with a cancer diagnosis then refer to oncology. Hope that's helpful. Let me know if you have any other questions. Thanks for taking care of him.

## 2019-10-14 NOTE — Telephone Encounter (Signed)
Spoke with patient regarding results and recommendations.  Referral placed.

## 2019-10-17 ENCOUNTER — Ambulatory Visit (INDEPENDENT_AMBULATORY_CARE_PROVIDER_SITE_OTHER)
Admission: RE | Admit: 2019-10-17 | Discharge: 2019-10-17 | Disposition: A | Discharge status: Home / Self Care | Payer: Medicare Other | Source: Ambulatory Visit | Attending: Primary Care | Admitting: Primary Care

## 2019-10-17 ENCOUNTER — Other Ambulatory Visit: Payer: Self-pay | Admitting: Primary Care

## 2019-10-17 ENCOUNTER — Encounter: Payer: Self-pay | Admitting: Primary Care

## 2019-10-17 ENCOUNTER — Other Ambulatory Visit: Payer: Self-pay

## 2019-10-17 ENCOUNTER — Ambulatory Visit (INDEPENDENT_AMBULATORY_CARE_PROVIDER_SITE_OTHER): Payer: Medicare Other | Admitting: Primary Care

## 2019-10-17 VITALS — BP 116/60 | HR 50 | Ht 65.0 in | Wt 167.0 lb

## 2019-10-17 DIAGNOSIS — R296 Repeated falls: Secondary | ICD-10-CM | POA: Diagnosis not present

## 2019-10-17 DIAGNOSIS — M1611 Unilateral primary osteoarthritis, right hip: Secondary | ICD-10-CM | POA: Diagnosis not present

## 2019-10-17 DIAGNOSIS — Z720 Tobacco use: Secondary | ICD-10-CM | POA: Diagnosis not present

## 2019-10-17 DIAGNOSIS — M25551 Pain in right hip: Secondary | ICD-10-CM

## 2019-10-17 DIAGNOSIS — G8929 Other chronic pain: Secondary | ICD-10-CM

## 2019-10-17 DIAGNOSIS — I739 Peripheral vascular disease, unspecified: Secondary | ICD-10-CM | POA: Diagnosis not present

## 2019-10-17 DIAGNOSIS — K2100 Gastro-esophageal reflux disease with esophagitis, without bleeding: Secondary | ICD-10-CM

## 2019-10-17 MED ORDER — CHANTIX STARTING MONTH PAK 0.5 MG X 11 & 1 MG X 42 PO TABS: ORAL_TABLET | ORAL | 0 refills | Status: DC

## 2019-10-17 MED ORDER — PREDNISONE 20 MG PO TABS: ORAL_TABLET | ORAL | 0 refills | Status: DC

## 2019-10-17 NOTE — Assessment & Plan Note (Signed)
Chronic for years, ready to quit. Failed OTC treatment with patches and gum.  Discussed options for treatment, he would like to try Chantix.  Pt was counseled on smoking cessation today for 5 minutes.  He was also counseled on the side effects of Chantix including nausea, dizziness, bad dreams and rarely suicide ideation.  He is aware and wishes to proceed.   He will call for a refill if warranted.

## 2019-10-17 NOTE — Assessment & Plan Note (Signed)
Acute on chronic flare, likely from increased activity, but given his recent falls we need to further evaluate.   Symptoms do sound representative of osteoarthritis, seems to also have nerve involvement now.  Rx for prednisone course sent to pharmacy. Plain films of right hip pending.  He will update. Consider PT.

## 2019-10-17 NOTE — Progress Notes (Signed)
Subjective:    Patient ID: Antonio [name], male    DOB: Jul 08, 1945, 74 y.o.   MRN: [id number]  HPI  This visit occurred during the SARS-CoV-2 public health emergency.  Safety protocols were in place, including screening questions prior to the visit, additional usage of staff PPE, and extensive cleaning of exam room while observing appropriate contact time as indicated for disinfecting solutions.   Antonio [name] is a 74 year old male with a history of coronary atherosclerosis, hypertension, PAD, GERD, type 2 diabetes, tobacco abuse, esophageal abnormality on CT chest who presents today with a chief complaint of hip pain.  Pain is located to the right hip which is most bothersome when waking in the morning, improved with activity throughout the day.  He's also noticed some numbness to the right hip over the last 3-4 weeks with radiation down to the right thigh. Symptoms began a few months ago, but more bothersome over this last week. He admits to an increase in activity over the last week as he's been painting/twisting/turning/up and down ladders.   He's fallen twice within the last several weeks due to his hip pain, his right leg buckled, and he fell. He's taken ibuprofen 200 mg twice daily intermittently a few times without much improvement.   Recently underwent low dose CT chest last week for lung cancer screening which showed "distal esophageal wall thickening" that appeared suspicious, esophageal cancer could not be excluded. Esophagram recommended. He was referred to GI for endoscopy.   He is smoking 1 to 1 and 1/2 PPD of cigarettes, is ready to quit. Has tried the nicotine patches and gum but this wasn't effective.   Review of Systems  Musculoskeletal: Positive for arthralgias.       Acute on chronic hip pain  Skin: Negative for color change.  Neurological: Positive for numbness. Negative for weakness.       Past Medical History:  Diagnosis Date  . Arthritis   . BPH with urinary  obstruction    stable on flomax ([name]  . Coronary atherosclerosis of unspecified type of vessel, native or graft   . Diabetes mellitus    Type II  . Emphysema of lung ([place])   . Esophageal stricture   . Gastritis   . GERD (gastroesophageal reflux disease)   . Hiatal hernia   . Hx of colonic polyps   . Hyperlipidemia   . Hypertension   . Internal hemorrhoids without mention of complication   . Lumbago   . Pain in both lower legs   . PSA elevation    now averaging 2's  . Tobacco abuse   . URI (upper respiratory infection)   . Urinary retention      Social History   Socioeconomic History  . Marital status: Married    Spouse name: [name]  . Number of children: 3  . Years of education: Not on file  . Highest education level: Not on file  Occupational History  . Occupation: Retired Games developer  Tobacco Use  . Smoking status: Current Every Day Smoker    Packs/day: 1.50    Years: 57.00    Pack years: 85.50    Types: Cigarettes  . Smokeless tobacco: Former Systems developer    Types: Chew  . Tobacco comment: ready to quit wants to talk to [facility] at appointment  Vaping Use  . Vaping Use: Never used  Substance and Sexual Activity  . Alcohol use: Not Currently    Alcohol/week: 0.0 standard drinks  Comment: rare  . Drug use: No  . Sexual activity: Yes  Other Topics Concern  . Not on file  Social History Narrative   Married 9/09 wife with moderate memory problems.   3 adult children, 2 grandchildren   Would desire CPR   Social Determinants of Health   Financial Resource Strain:   . Difficulty of Paying Living Expenses: Not on file  Food Insecurity:   . Worried About Charity fundraiser in the Last Year: Not on file  . Ran Out of Food in the Last Year: Not on file  Transportation Needs:   . Lack of Transportation (Medical): Not on file  . Lack of Transportation (Non-Medical): Not on file  Physical Activity:   . Days of Exercise per Week: Not on file  . Minutes of Exercise  per Session: Not on file  Stress:   . Feeling of Stress : Not on file  Social Connections:   . Frequency of Communication with Friends and Family: Not on file  . Frequency of Social Gatherings with Friends and Family: Not on file  . Attends Religious Services: Not on file  . Active Member of Clubs or Organizations: Not on file  . Attends Archivist Meetings: Not on file  . Marital Status: Not on file  Intimate Partner Violence:   . Fear of Current or Ex-Partner: Not on file  . Emotionally Abused: Not on file  . Physically Abused: Not on file  . Sexually Abused: Not on file    Past Surgical History:  Procedure Laterality Date  . 2D Echo  06/03  . Abdominal ultrasound  06/03   Negative  . BACK SURGERY    . CT of head  and sinuses  06/03   Negative  . CT of the chest, abdomen, pelvis  06/03   Chest negative; Abdomen, left adrenal lesion; Pelvis, enlarged prostate  . ESOPHAGOGASTRODUODENOSCOPY  02/13/07   gastritis and duodenitis without bleed  . ESOPHAGOGASTRODUODENOSCOPY N/A 06/26/2014   Procedure: ESOPHAGOGASTRODUODENOSCOPY (EGD);  Surgeon: Milus Banister, MD;  Location: [place];  Service: Endoscopy;  Laterality: N/A;  . KNEE ARTHROSCOPY  10/00   Right  . LOWER EXTREMITY ANGIOGRAPHY Right 12/27/2017   Procedure: LOWER EXTREMITY ANGIOGRAPHY;  Surgeon: Algernon Huxley, MD;  Location: [place] [facility];  Service: Cardiovascular;  Laterality: Right;  . LOWER EXTREMITY ANGIOGRAPHY Left 01/10/2018   Procedure: LOWER EXTREMITY ANGIOGRAPHY;  Surgeon: Algernon Huxley, MD;  Location: [place] [facility];  Service: Cardiovascular;  Laterality: Left;  . LOWER EXTREMITY ANGIOGRAPHY Left 05/29/2018   Procedure: LOWER EXTREMITY ANGIOGRAPHY;  Surgeon: Algernon Huxley, MD;  Location: [place] [facility];  Service: Cardiovascular;  Laterality: Left;  . LOWER EXTREMITY ANGIOGRAPHY Left 05/30/2018   Procedure: Lower Extremity Angiography;  Surgeon: Algernon Huxley, MD;  Location: [place]  [facility];  Service: Cardiovascular;  Laterality: Left;  . LP  06/03  . Microwave thermotherapy prostate  08/28/07   [facility] Blocker  . Persantine cardiolite  03/01   EF 68%  . POLYPECTOMY  11/95  . Stress myoview  07/06   EF 67%    Family History  Problem Relation Age of Onset  . Diabetes Father   . Alcohol abuse Paternal Uncle   . Alcohol abuse Paternal Uncle   . Alcohol abuse Paternal Uncle   . Rheum arthritis Mother   . Heart disease Neg Hx   . Stroke Neg Hx   . Cancer Neg Hx   .  Drug abuse Neg Hx   . Depression Neg Hx   . Colon cancer Neg Hx   . Rectal cancer Neg Hx   . Stomach cancer Neg Hx   . Kidney cancer Neg Hx   . Bladder Cancer Neg Hx   . Prostate cancer Neg Hx     No Known Allergies  Current Outpatient Medications on File Prior to Visit  Medication Sig Dispense Refill  . amLODipine (NORVASC) 5 MG tablet Take by mouth.    [name] aspirin 81 MG tablet Take 81 mg by mouth daily.    [name] atorvastatin (LIPITOR) 20 MG tablet TAKE 1 TABLET BY MOUTH EVERY EVENING FOR CHOLESTEROL 90 tablet 1  . ELIQUIS 5 MG TABS tablet TAKE 1 TABLET BY MOUTH TWICE A DAY 60 tablet 4  . finasteride (PROSCAR) 5 MG tablet Take 1 tablet (5 mg total) by mouth daily. 90 tablet 3  . glipiZIDE (GLUCOTROL) 10 MG tablet Take 10 mg by mouth daily before supper.     [name] glucose blood ([employer] TEST STRIPS) test strip Use one strip three times daily.    . insulin detemir (LEVEMIR) 100 UNIT/ML injection Inject 20 units at bedtime. (Patient taking differently: every morning. Inject 15 units at breakfast) 10 pen 5  . lisinopril-hydrochlorothiazide (ZESTORETIC) 20-25 MG tablet TAKE 1 TABLET BY MOUTH EVERY DAY FOR BLOOD PRESSURE 90 tablet 1  . metFORMIN (GLUCOPHAGE) 500 MG tablet TAKE 2 TABLETS BY MOUTH TWICE DAILY WITH A MEAL FOR DIABETES. 360 tablet 3  . metoprolol tartrate (LOPRESSOR) 50 MG tablet TAKE 1/2 TABLET BY MOUTH TWICE A DAY 90 tablet 1  . OVER THE COUNTER MEDICATION Glucosomine one tablet daily.    . tamsulosin  (FLOMAX) 0.4 MG CAPS capsule Take 2 capsules by mouth once daily 180 capsule 3   No current facility-administered medications on file prior to visit.    BP 116/60   Pulse (!) 50   Ht 5\' 5"  (1.651 m)   Wt 167 lb (75.8 kg)   SpO2 97%   BMI 27.79 kg/m    Objective:   Physical Exam Cardiovascular:     Rate and Rhythm: Normal rate and regular rhythm.  Musculoskeletal:     Comments: Altered gait with minor limp upon ambulation. Normal ROM with mild pain to right hip with flexion and rotation while supine. No shortening noted.   Neurological:     Mental Status: He is alert.  Psychiatric:        Mood and Affect: Mood normal.            Assessment & Plan:

## 2019-10-17 NOTE — Patient Instructions (Signed)
Complete xray(s) prior to leaving today. I will notify you of your results once received.  Start prednisone tablets. Take two tablets for 3 days, then one tablet for 3 days.   Before you start Chantix you must choose a smoking quit date. The quit date needs to be within the first two weeks of starting the pill pack.   Start with the Starting Month Pack and then call me if you want another refill.   It was a pleasure to see you today!

## 2019-10-18 ENCOUNTER — Telehealth: Payer: Self-pay

## 2019-10-18 NOTE — Telephone Encounter (Signed)
Patient aware of results and recommendations. °

## 2019-10-18 NOTE — Telephone Encounter (Signed)
-----   Message from Pleas Koch, NP sent at 10/17/2019  4:28 PM EDT ----- Please call patient:  X-ray of the hip shows arthritis without fracture. -Try the steroids and update me. -Would recommend physical therapy if symptoms persist.

## 2019-11-05 ENCOUNTER — Telehealth: Payer: Self-pay | Admitting: Primary Care

## 2019-11-05 NOTE — Telephone Encounter (Signed)
Antonio [name], can you look into this? This patient has concerning esophageal findings that could be cancerous.  Shawn, thank you for the message, I appreciate it.

## 2019-11-05 NOTE — Telephone Encounter (Signed)
-----   Message from Lieutenant Diego, RN sent at 11/05/2019  9:10 AM EDT ----- Regarding: GI referral Good morning Belenda Cruise, I was just following up on Mr. [place] esophageal finding. I see where you referred to GI but don't see an appt made yet.   Just wanted to see if there is anything I can do to help. Thanks, [employer]

## 2019-11-05 NOTE — Telephone Encounter (Signed)
Antonio [name], I took a look at the appt desk I see where the patient has an appt on 11/19/19 at [facility] with Tye Savoy.  Is this ok, or would you like me to call to see if they can be seen sooner?

## 2019-11-05 NOTE — Telephone Encounter (Signed)
This is fine, thank you.

## 2019-11-09 ENCOUNTER — Other Ambulatory Visit (INDEPENDENT_AMBULATORY_CARE_PROVIDER_SITE_OTHER): Payer: Self-pay | Admitting: Nurse Practitioner

## 2019-11-09 ENCOUNTER — Other Ambulatory Visit: Payer: Self-pay | Admitting: Primary Care

## 2019-11-09 DIAGNOSIS — K2100 Gastro-esophageal reflux disease with esophagitis, without bleeding: Secondary | ICD-10-CM

## 2019-11-12 NOTE — Telephone Encounter (Signed)
Pt states he is still in pain. He wants to know what you suggest he should do for the pain. Is there over the counter medication that will make him better?

## 2019-11-12 NOTE — Telephone Encounter (Signed)
He can try taking extra strength Tylenol 500 to 1000 mg every 8 hours for pain. We could send him to physical therapy for his pain, or orthopedics for further evaluation.

## 2019-11-12 NOTE — Telephone Encounter (Signed)
You only gave pt 30 day supply ok to refill.

## 2019-11-12 NOTE — Telephone Encounter (Signed)
Called patient he would like to try the tylenol for a few days and see how that does. If no improvement he will call back and let us now if he would like to go to PT or ortho. I did have patient repeat back to me instructions on medications.

## 2019-11-12 NOTE — Telephone Encounter (Signed)
I know he was just in for an acute visit, but it looks like he is overdue for general follow-up including diabetes.  This prescription was likely sent for 30 tablets by my prior CMA for this very reason.  Can we get him scheduled for follow-up?  I will send in 90-day supply of his pantoprazole.

## 2019-11-19 ENCOUNTER — Encounter: Payer: Self-pay | Admitting: Nurse Practitioner

## 2019-11-19 ENCOUNTER — Ambulatory Visit (INDEPENDENT_AMBULATORY_CARE_PROVIDER_SITE_OTHER): Payer: Medicare Other | Admitting: Nurse Practitioner

## 2019-11-19 ENCOUNTER — Telehealth: Payer: Self-pay

## 2019-11-19 VITALS — BP 110/60 | HR 72 | Ht 65.25 in | Wt 166.0 lb

## 2019-11-19 DIAGNOSIS — R9389 Abnormal findings on diagnostic imaging of other specified body structures: Secondary | ICD-10-CM

## 2019-11-19 DIAGNOSIS — Z86010 Personal history of colonic polyps: Secondary | ICD-10-CM

## 2019-11-19 DIAGNOSIS — R1310 Dysphagia, unspecified: Secondary | ICD-10-CM | POA: Diagnosis not present

## 2019-11-19 MED ORDER — PLENVU 140 G PO SOLR: 1.0000 | Freq: Once | ORAL | 0 refills | Status: AC

## 2019-11-19 NOTE — Progress Notes (Signed)
ASSESSMENT AND PLAN    # Abnormal esophagus on chest CT scan.  --Similar findings on chest CT scan August 2020 with findings of only a tortous esophaus on EGD with empirical dilation Sept 2020. Recent repeat chest CT scan again demonstrates distal esophageal thickening, slightly progressive. He does have very occasional solid food dysphagia which had improved after last dilation --For further evaluation patient will be scheduled for EGD with possible repeat dilation. The risks and benefits of EGD were discussed and the patient agrees to proceed.   # Chronic anticoagulation --Hold Eliquis for 2 days before procedure - will instruct when and how to resume after procedure. Patient understands that there is a low but real risk of cardiovascular event such as heart attack, stroke, or embolism /  thrombosis while off blood thinner. The patient consents to proceed. Will communicate by phone or EMR with patient's prescribing provider to confirm that holding Eliquis is reasonable in this case.   # Hx of adenomatous colon polyps including an 11 mm cecal polyp in 2016.  --He is overdue for polyp surveillance. Patient will be scheduled for a colonoscopy (at time of EGD). The risks and benefits of colonoscopy with possible polypectomy / biopsies were discussed and the patient agrees to proceed.    HISTORY OF PRESENT ILLNESS     Primary Gastroenterologist :  Lucio Edward, MD  Chief Complaint : abnormal esophagus on CT scan   Antonio [name] is a 74 y.o. male with PMH / [place] significant for,  but not necessarily limited to: DM, CAD, HTN, hyperlipidemia,  tobacco abuse, emphysema, PAD / angioplasty / stents,   Patient is referred by PCP for evaluation of abnormal esophagus on lung cancer screening CT scan which he gets annually. He still smokes. Patient initially denied any swallowing problems but upon further questioning he endorses very occasional solid food dysphagia, mainly in the mornings at  breakfast. Generally after waiting a minute / walking around the food will pass. He had an EGD Sept 2020 for evaluation of dysphagia and abnormal chest  CT scan suggesting mass-like thickening of the distal esophagus. Esophagus was tortuous but no other esophageal abnormalities. Esophagus was empirically dilated. Patient says he feels like post dilation that dysphagia improved. He isn't certain when swallowing problems started to recur but says the infrequent episodes are not particularly bothersome. He is on PPI therapy. No GERD symptoms   Data Reviewed: 10/08/19 Chest CT scan IMPRESSION: 1. Lung-RADS 1, negative. Continue annual screening with low-dose chest CT without contrast in 12 months. 2. Distal esophageal wall thickening appears somewhat progressive. Esophageal neoplasm cannot be excluded. Esophagram may be helpful in further evaluation, as clinically indicated. These results will be called to the ordering clinician or representative by the Radiologist Assistant, and communication documented in the PACS or [employer]. 3. Left adrenal adenoma. 4. Aortic atherosclerosis (ICD10-I70.0). Coronary artery calcification. 5.  Emphysema (ICD10-J43.9).  Previous Endoscopic Evaluations / Pertinent Studies:   10/27/19 EGD for dysphagia and abnormal esophagus on chest CT scan -No endoscopic esophageal abnormality to explain patient's dysphagia. Esophagus dilated. Dilated. - Normal stomach. - Normal duodenal bulb and second portion of the duodenum. - No specimens collected.  Past Medical History:  Diagnosis Date  . Arthritis   . 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 Coronary atherosclerosis of unspecified type of vessel, native or graft   . Diabetes mellitus    Type II  . Emphysema of lung ([place])   .  Esophageal stricture   . Gastritis   . GERD (gastroesophageal reflux disease)   . Hiatal hernia   . Hx of colonic polyps   . Hyperlipidemia   .  Hypertension   . Internal hemorrhoids without mention of complication   . Lumbago   . Pain in both lower legs   . PSA elevation    now averaging 2's  . Tobacco abuse   . URI (upper respiratory infection)   . Urinary retention     Current Medications, Allergies, Past Surgical History, Family History and Social History were reviewed in [employer] record.   Current Outpatient Medications  Medication Sig Dispense Refill  . acetaminophen (TYLENOL) 500 MG tablet Take 1,000 mg by mouth in the morning and at bedtime.    [name] amLODipine (NORVASC) 5 MG tablet Take by mouth.    [name] aspirin 81 MG tablet Take 81 mg by mouth daily.    [name] atorvastatin (LIPITOR) 20 MG tablet TAKE 1 TABLET BY MOUTH EVERY EVENING FOR CHOLESTEROL 90 tablet 1  . ELIQUIS 5 MG TABS tablet TAKE 1 TABLET BY MOUTH TWICE A DAY 60 tablet 4  . finasteride (PROSCAR) 5 MG tablet Take 1 tablet (5 mg total) by mouth daily. 90 tablet 3  . glipiZIDE (GLUCOTROL) 10 MG tablet Take 10 mg by mouth daily before supper.     [name] glucose blood ([employer] TEST STRIPS) test strip Use one strip three times daily.    . insulin detemir (LEVEMIR) 100 UNIT/ML injection Inject 20 units at bedtime. (Patient taking differently: every morning. Inject 15 units at breakfast) 10 pen 5  . lisinopril-hydrochlorothiazide (ZESTORETIC) 20-25 MG tablet TAKE 1 TABLET BY MOUTH EVERY DAY FOR BLOOD PRESSURE 90 tablet 1  . metFORMIN (GLUCOPHAGE) 500 MG tablet TAKE 2 TABLETS BY MOUTH TWICE DAILY WITH A MEAL FOR DIABETES. 360 tablet 3  . metoprolol tartrate (LOPRESSOR) 50 MG tablet TAKE 1/2 TABLET BY MOUTH TWICE A DAY 90 tablet 1  . OVER THE COUNTER MEDICATION Glucosomine one tablet daily.    . pantoprazole (PROTONIX) 40 MG tablet Take 1 tablet (40 mg total) by mouth daily. For heartburn. 90 tablet 0  . tamsulosin (FLOMAX) 0.4 MG CAPS capsule Take 2 capsules by mouth once daily 180 capsule 3  . varenicline (CHANTIX STARTING MONTH PAK) 0.5 MG X 11 & 1 MG X 42  tablet Take one 0.5 mg tablet by mouth once daily for 3 days, then increase to one 0.5 mg tablet twice daily for 4 days, then increase to one 1 mg tablet twice daily. 53 tablet 0   No current facility-administered medications for this visit.    Review of Systems: No chest pain. No shortness of breath. No urinary complaints.   PHYSICAL EXAM :    Wt Readings from Last 3 Encounters:  11/19/19 166 lb (75.3 kg)  10/17/19 167 lb (75.8 kg)  10/08/19 170 lb (77.1 kg)    BP 110/60 (Patient Position: Sitting, Cuff Size: Normal)   Pulse 72 Comment: irregular  Ht 5' 5.25" (1.657 m) Comment: height measured without shoes  Wt 166 lb (75.3 kg)   BMI 27.41 kg/m  Constitutional:  Pleasant male in no acute distress. Psychiatric: Normal mood and affect. Behavior is normal. EENT: Pupils normal.  Conjunctivae are normal. No scleral icterus. Neck supple.  Cardiovascular: Normal rate, regular rhythm. No edema Pulmonary/chest: Effort normal and breath sounds normal. No wheezing, rales or rhonchi. Abdominal: Soft, nondistended, nontender. Bowel sounds active throughout. There are no  masses palpable. No hepatomegaly. Neurological: Alert and oriented to person place and time. Skin: Skin is warm and dry. No rashes noted.  Tye Savoy, NP  11/19/2019, 3:17 PM  Cc:  Pleas Koch, NP

## 2019-11-19 NOTE — Patient Instructions (Signed)
If you are age 75 or older, your body mass index should be between 23-30. Your Body mass index is 27.41 kg/m. If this is out of the aforementioned range listed, please consider follow up with your Primary Care Provider.  If you are age 80 or younger, your body mass index should be between 19-25. Your Body mass index is 27.41 kg/m. If this is out of the aformentioned range listed, please consider follow up with your Primary Care Provider.    You have been scheduled for an endoscopy and colonoscopy. Please follow the written instructions given to you at your visit today. Please pick up your prep supplies at the pharmacy within the next 1-3 days. If you use inhalers (even only as needed), please bring them with you on the day of your procedure.

## 2019-11-19 NOTE — Telephone Encounter (Signed)
  11/19/2019   RE: Antonio [name] DOB: 1945-08-12 MRN: [id number]   Dear Dr. Lucky Cowboy,    We have scheduled the above patient for an endoscopic procedure. Our records show that he is on anticoagulation therapy.   Please advise as to how long the patient may come off his therapy of  Eliquis prior to the procedure, which is scheduled for 01/07/2020.  Please fax back/ or route the completed form to [place] at [phone].   Sincerely,    Phillis Haggis

## 2019-11-20 ENCOUNTER — Other Ambulatory Visit: Payer: Self-pay

## 2019-11-20 ENCOUNTER — Telehealth: Payer: Self-pay

## 2019-11-20 MED ORDER — BUPROPION HCL ER (SR) 150 MG PO TB12: ORAL_TABLET | ORAL | 1 refills | Status: DC

## 2019-11-20 NOTE — Telephone Encounter (Signed)
I believe he mention to me that he has tried the patches without success, is that correct? The only other option would be to try a medication called Wellbutrin.  This is technically an antidepressant but is also used to help with tobacco cravings with a goal of complete cessation.  If he would like to move forward the Wellbutrin and I recommend bupropion SR 150 mg once daily for 5 days, then increase to 150 mg twice daily.  #60, 1 refill.  He needs to choose his smoking quit date before he starts the bupropion medication.  Is okay to smoke while on the bupropion, but he should try quit within the first week or 2.

## 2019-11-20 NOTE — Telephone Encounter (Signed)
Called patient reviewed information. He would like to start bupropion. Have given instructions and repeated back to me. Meds called in to pharmacy. Patent will call if any questions.

## 2019-11-20 NOTE — Telephone Encounter (Signed)
Fax received medication is on back order. Do you want to change to something?

## 2019-11-20 NOTE — Progress Notes (Signed)
Reviewed and agree with management plan.  Geneieve Duell T. Malky Rudzinski, MD FACG [facility]  

## 2019-11-28 NOTE — Telephone Encounter (Signed)
Spoke with patient and told him he could hold is Eliquis for two days prior to his procedure.  Patient agreed.

## 2019-11-30 ENCOUNTER — Other Ambulatory Visit: Payer: Self-pay | Admitting: Primary Care

## 2019-11-30 DIAGNOSIS — I10 Essential (primary) hypertension: Secondary | ICD-10-CM

## 2019-12-02 ENCOUNTER — Encounter: Payer: Self-pay | Admitting: Primary Care

## 2019-12-02 ENCOUNTER — Ambulatory Visit (INDEPENDENT_AMBULATORY_CARE_PROVIDER_SITE_OTHER): Payer: Medicare Other | Admitting: Primary Care

## 2019-12-02 ENCOUNTER — Other Ambulatory Visit: Payer: Self-pay

## 2019-12-02 VITALS — BP 104/58 | HR 48 | Temp 98.7°F | Ht 65.25 in | Wt 165.0 lb

## 2019-12-02 DIAGNOSIS — N401 Enlarged prostate with lower urinary tract symptoms: Secondary | ICD-10-CM

## 2019-12-02 DIAGNOSIS — E785 Hyperlipidemia, unspecified: Secondary | ICD-10-CM

## 2019-12-02 DIAGNOSIS — Z720 Tobacco use: Secondary | ICD-10-CM

## 2019-12-02 DIAGNOSIS — K2100 Gastro-esophageal reflux disease with esophagitis, without bleeding: Secondary | ICD-10-CM | POA: Diagnosis not present

## 2019-12-02 DIAGNOSIS — I10 Essential (primary) hypertension: Secondary | ICD-10-CM

## 2019-12-02 DIAGNOSIS — Z794 Long term (current) use of insulin: Secondary | ICD-10-CM | POA: Diagnosis not present

## 2019-12-02 DIAGNOSIS — N138 Other obstructive and reflux uropathy: Secondary | ICD-10-CM

## 2019-12-02 DIAGNOSIS — Z125 Encounter for screening for malignant neoplasm of prostate: Secondary | ICD-10-CM

## 2019-12-02 DIAGNOSIS — E11618 Type 2 diabetes mellitus with other diabetic arthropathy: Secondary | ICD-10-CM | POA: Diagnosis not present

## 2019-12-02 LAB — POCT GLYCOSYLATED HEMOGLOBIN (HGB A1C): Hemoglobin A1C: 6.8 % — AB (ref 4.0–5.6)

## 2019-12-02 LAB — COMPREHENSIVE METABOLIC PANEL
ALT: 9 U/L (ref 0–53)
AST: 11 U/L (ref 0–37)
Albumin: 4.1 g/dL (ref 3.5–5.2)
Alkaline Phosphatase: 42 U/L (ref 39–117)
BUN: 26 mg/dL — ABNORMAL HIGH (ref 6–23)
CO2: 21 mEq/L (ref 19–32)
Calcium: 8.9 mg/dL (ref 8.4–10.5)
Chloride: 113 mEq/L — ABNORMAL HIGH (ref 96–112)
Creatinine, Ser: 1.19 mg/dL (ref 0.40–1.50)
GFR: 59.99 mL/min — ABNORMAL LOW (ref >60.00)
Glucose, Bld: 74 mg/dL (ref 70–99)
Potassium: 5.2 mEq/L — ABNORMAL HIGH (ref 3.5–5.1)
Sodium: 142 mEq/L (ref 135–145)
Total Bilirubin: 0.3 mg/dL (ref 0.2–1.2)
Total Protein: 6.2 g/dL (ref 6.0–8.3)

## 2019-12-02 LAB — LIPID PANEL
Cholesterol: 102 mg/dL (ref 0–200)
HDL: 38.5 mg/dL — ABNORMAL LOW (ref >39.00)
LDL Cholesterol: 37 mg/dL (ref 0–99)
NonHDL: 63.6
Total CHOL/HDL Ratio: 3
Triglycerides: 131 mg/dL (ref 0.0–149.0)
VLDL: 26.2 mg/dL (ref 0.0–40.0)

## 2019-12-02 LAB — CBC
HCT: 26.2 % — ABNORMAL LOW (ref 39.0–52.0)
Hemoglobin: 8.3 g/dL — ABNORMAL LOW (ref 13.0–17.0)
MCHC: 31.8 g/dL (ref 30.0–36.0)
MCV: 81 fl (ref 78.0–100.0)
Platelets: 281 10*3/uL (ref 150.0–400.0)
RBC: 3.23 Mil/uL — ABNORMAL LOW (ref 4.22–5.81)
RDW: 17 % — ABNORMAL HIGH (ref 11.5–15.5)
WBC: 9.3 10*3/uL (ref 4.0–10.5)

## 2019-12-02 LAB — PSA, MEDICARE: PSA: 0.24 ng/ml (ref 0.10–4.00)

## 2019-12-02 NOTE — Assessment & Plan Note (Signed)
Compliant to atorvastatin 20 mg. Repeat lipids pending.

## 2019-12-02 NOTE — Patient Instructions (Addendum)
Stop by the lab prior to leaving today. I will notify you of your results once received.   Call [phone] in early January 2022 for your Covid-19 Booster shot.  Start the Wellbutrin for smoking cessation.  Hold your amlodipine 5 mg medication for blood pressure for 2 weeks.  Schedule a nurse visit for 2 weeks for blood pressure check.  It was a pleasure to see you today!

## 2019-12-02 NOTE — Assessment & Plan Note (Signed)
Has not yet started Wellbutrin, but plans on doing so soon. Lung cancer screening completed in 2021.

## 2019-12-02 NOTE — Assessment & Plan Note (Signed)
Doing well on pantoprazole 40 mg, continue same. Following with GI.

## 2019-12-02 NOTE — Assessment & Plan Note (Signed)
No LUTS, feels well managed on tamsulosin and finasteride. Continue same. Following with Urology.

## 2019-12-02 NOTE — Assessment & Plan Note (Signed)
Low on exam today, also with what sounds like orthostatic changes at home.   Hold amlodipine for 2 weeks. Check BP in 2 weeks through nurse visit.  Continue lisinopril-HCTZ 20-25 mg. CMP pending.

## 2019-12-02 NOTE — Progress Notes (Signed)
Subjective:    Patient ID: Antonio [name], male    DOB: [birth year], 74 y.o.   MRN: [id number]  HPI  This visit occurred during the SARS-CoV-2 public health emergency.  Safety protocols were in place, including screening questions prior to the visit, additional usage of staff PPE, and extensive cleaning of exam room while observing appropriate contact time as indicated for disinfecting solutions.   Antonio [name] is a 74 year old male with a history of CAD, PAD, hypertension, type 2 diabetes, BPH, hyperlipidemia who presents today for follow up.   1) Type 2 Diabetes: He follows with endocrinology.   Current medications include: Glipizide 10 mg BID, Metformin 1000 mg BID, Levemir 15 units daily.  He is checking his blood glucose 2-3 times weekly and is getting readings of:  AM fasting: 90's to 120's.   Recent lowest reading: 80's  Last A1C: 7.6 in August 2020, 6.8 today Last Eye Exam: Due, he will schedule  Last Foot Exam: Due Pneumonia Vaccination: Completed in 2020 ACE/ARB: lisinopril  Statin: atorvastatin   2) Essential Hypertension: Currently managed on amlodipine 5 mg, metoprolol tartrate 50 mg BID, and lisinopril-HCTZ 20-12.5 mg. He does not check his blood pressure at home. He does feel dizzy at times when getting out of bed in the morning and when rising from a seated position.   BP Readings from Last 3 Encounters:  12/02/19 (!) 104/58  11/19/19 110/60  10/17/19 116/60    3) Tobacco Abuse: Currently prescribed Wellbutrin SR 150 mg BID for which was prescribed a few weeks ago. He has yet to start this treatment but is planning on doing so soon. He completed lung cancer screening in September 2021.  Review of Systems  Eyes: Negative for visual disturbance.  Respiratory: Negative for shortness of breath.   Cardiovascular: Negative for chest pain.  Neurological: Positive for dizziness. Negative for numbness and headaches.  Psychiatric/Behavioral: The patient is not  nervous/anxious.        Past Medical History:  Diagnosis Date  . Arthritis   . 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 Coronary atherosclerosis of unspecified type of vessel, native or graft   . Diabetes mellitus    Type II  . Emphysema of lung ([place])   . Esophageal stricture   . Gastritis   . GERD (gastroesophageal reflux disease)   . Hiatal hernia   . Hx of colonic polyps   . Hyperlipidemia   . Hypertension   . Internal hemorrhoids without mention of complication   . Lumbago   . Pain in both lower legs   . PSA elevation    now averaging 2's  . Tobacco abuse   . URI (upper respiratory infection)   . Urinary retention      Social History   Socioeconomic History  . Marital status: Married    Spouse name: [name]  . Number of children: 3  . Years of education: Not on file  . Highest education level: Not on file  Occupational History  . Occupation: Retired Games developer  Tobacco Use  . Smoking status: Current Every Day Smoker    Packs/day: 1.50    Years: 57.00    Pack years: 85.50    Types: Cigarettes  . Smokeless tobacco: Former Systems developer    Types: Chew  . Tobacco comment: ready to quit wants to talk to [facility] at appointment  Vaping Use  . Vaping Use: Never used  Substance and Sexual Activity  .  Alcohol use: Not Currently    Alcohol/week: 0.0 standard drinks    Comment: rare  . Drug use: No  . Sexual activity: Yes  Other Topics Concern  . Not on file  Social History Narrative   Married 9/09 wife with moderate memory problems.   3 adult children, 2 grandchildren   Would desire CPR   Social Determinants of Health   Financial Resource Strain:   . Difficulty of Paying Living Expenses: Not on file  Food Insecurity:   . Worried About Charity fundraiser in the Last Year: Not on file  . Ran Out of Food in the Last Year: Not on file  Transportation Needs:   . Lack of Transportation (Medical): Not on file  . Lack of Transportation  (Non-Medical): Not on file  Physical Activity:   . Days of Exercise per Week: Not on file  . Minutes of Exercise per Session: Not on file  Stress:   . Feeling of Stress : Not on file  Social Connections:   . Frequency of Communication with Friends and Family: Not on file  . Frequency of Social Gatherings with Friends and Family: Not on file  . Attends Religious Services: Not on file  . Active Member of Clubs or Organizations: Not on file  . Attends Archivist Meetings: Not on file  . Marital Status: Not on file  Intimate Partner Violence:   . Fear of Current or Ex-Partner: Not on file  . Emotionally Abused: Not on file  . Physically Abused: Not on file  . Sexually Abused: Not on file    Past Surgical History:  Procedure Laterality Date  . 2D Echo  06/03  . Abdominal ultrasound  06/03   Negative  . BACK SURGERY    . CT of head  and sinuses  06/03   Negative  . CT of the chest, abdomen, pelvis  06/03   Chest negative; Abdomen, left adrenal lesion; Pelvis, enlarged prostate  . ESOPHAGOGASTRODUODENOSCOPY  02/13/07   gastritis and duodenitis without bleed  . ESOPHAGOGASTRODUODENOSCOPY N/A 06/26/2014   Procedure: ESOPHAGOGASTRODUODENOSCOPY (EGD);  Surgeon: Milus Banister, MD;  Location: [place];  Service: Endoscopy;  Laterality: N/A;  . KNEE ARTHROSCOPY  10/00   Right  . LOWER EXTREMITY ANGIOGRAPHY Right 12/27/2017   Procedure: LOWER EXTREMITY ANGIOGRAPHY;  Surgeon: Algernon Huxley, MD;  Location: [place] [facility];  Service: Cardiovascular;  Laterality: Right;  . LOWER EXTREMITY ANGIOGRAPHY Left 01/10/2018   Procedure: LOWER EXTREMITY ANGIOGRAPHY;  Surgeon: Algernon Huxley, MD;  Location: [place] [facility];  Service: Cardiovascular;  Laterality: Left;  . LOWER EXTREMITY ANGIOGRAPHY Left 05/29/2018   Procedure: LOWER EXTREMITY ANGIOGRAPHY;  Surgeon: Algernon Huxley, MD;  Location: [place] [facility];  Service: Cardiovascular;  Laterality: Left;  . LOWER EXTREMITY  ANGIOGRAPHY Left 05/30/2018   Procedure: Lower Extremity Angiography;  Surgeon: Algernon Huxley, MD;  Location: [place] [facility];  Service: Cardiovascular;  Laterality: Left;  . LP  06/03  . Microwave thermotherapy prostate  08/28/07   [place] Blocker  . Persantine cardiolite  03/01   EF 68%  . POLYPECTOMY  11/95  . Stress myoview  07/06   EF 67%    Family History  Problem Relation Age of Onset  . Diabetes Father   . Alcohol abuse Paternal Uncle   . Alcohol abuse Paternal Uncle   . Alcohol abuse Paternal Uncle   . Rheum arthritis Mother   . Heart disease Neg Hx   .  Stroke Neg Hx   . Cancer Neg Hx   . Drug abuse Neg Hx   . Depression Neg Hx   . Colon cancer Neg Hx   . Rectal cancer Neg Hx   . Stomach cancer Neg Hx   . Kidney cancer Neg Hx   . Bladder Cancer Neg Hx   . Prostate cancer Neg Hx     No Known Allergies  Current Outpatient Medications on File Prior to Visit  Medication Sig Dispense Refill  . acetaminophen (TYLENOL) 500 MG tablet Take 1,000 mg by mouth in the morning and at bedtime.    [name] amLODipine (NORVASC) 5 MG tablet Take by mouth.    [name] aspirin 81 MG tablet Take 81 mg by mouth daily.    [name] atorvastatin (LIPITOR) 20 MG tablet TAKE 1 TABLET BY MOUTH EVERY EVENING FOR CHOLESTEROL 90 tablet 1  . ELIQUIS 5 MG TABS tablet TAKE 1 TABLET BY MOUTH TWICE A DAY 60 tablet 4  . finasteride (PROSCAR) 5 MG tablet Take 1 tablet (5 mg total) by mouth daily. 90 tablet 3  . glipiZIDE (GLUCOTROL) 10 MG tablet Take 10 mg by mouth daily before supper.     . insulin detemir (LEVEMIR) 100 UNIT/ML injection Inject 20 units at bedtime. (Patient taking differently: every morning. Inject 15 units at breakfast) 10 pen 5  . lisinopril-hydrochlorothiazide (ZESTORETIC) 20-25 MG tablet TAKE 1 TABLET BY MOUTH EVERY DAY FOR BLOOD PRESSURE 90 tablet 1  . metFORMIN (GLUCOPHAGE) 500 MG tablet TAKE 2 TABLETS BY MOUTH TWICE DAILY WITH A MEAL FOR DIABETES. 360 tablet 3  . metoprolol tartrate (LOPRESSOR) 50 MG  tablet TAKE 1/2 TABLET BY MOUTH TWICE A DAY 90 tablet 1  . OVER THE COUNTER MEDICATION Glucosomine one tablet daily.    . pantoprazole (PROTONIX) 40 MG tablet Take 1 tablet (40 mg total) by mouth daily. For heartburn. 90 tablet 0  . tamsulosin (FLOMAX) 0.4 MG CAPS capsule Take 2 capsules by mouth once daily 180 capsule 3  . buPROPion (WELLBUTRIN SR) 150 MG 12 hr tablet One tab daily for 5 days, twice a day starting on day 6. (Patient not taking: Reported on 12/02/2019) 60 tablet 1   No current facility-administered medications on file prior to visit.    BP (!) 104/58   Pulse (!) 48   Temp 98.7 F (37.1 C) (Temporal)   Ht 5' 5.25" (1.657 m)   Wt 165 lb (74.8 kg)   SpO2 98%   BMI 27.25 kg/m    Objective:   Physical Exam Cardiovascular:     Rate and Rhythm: Regular rhythm. Bradycardia present.  Pulmonary:     Effort: Pulmonary effort is normal.     Breath sounds: Normal breath sounds.  Musculoskeletal:     Cervical back: Neck supple.  Skin:    General: Skin is warm and dry.            Assessment & Plan:

## 2019-12-02 NOTE — Assessment & Plan Note (Signed)
Well controlled in the office today. Follows with endocrinology.  Continue Levemir 15 units HS. Continue Metformin 1000 mg BID. Continue Glipizide 10 mg BID.

## 2019-12-04 ENCOUNTER — Other Ambulatory Visit: Payer: Self-pay

## 2019-12-04 ENCOUNTER — Telehealth: Payer: Self-pay

## 2019-12-04 DIAGNOSIS — D500 Iron deficiency anemia secondary to blood loss (chronic): Secondary | ICD-10-CM

## 2019-12-04 NOTE — Telephone Encounter (Signed)
Patient contacted again. Agrees to come for the labs on Monday 12/08/19. Agrees to moving his EGD and colonoscopy to 12/11/19 at 10:30. Called to Dr Lucky Cowboy who is the prescribing MD for the Eliquis. Had to leave a message on the voicemail. Asking to expedite the faxed letter to give clearance to hold the Eliquis 2 days prior to the procedure. Patient will pick up his new instructions on Monday when he comes in for labs. Patient states he is fully vaccinated against COVID.

## 2019-12-04 NOTE — Telephone Encounter (Signed)
Okay, please add a CBC next week to the ferritin and TIBC. Beth can you see if Dr. Rush Landmark has any openings for a double in early to mid November. Thanks

## 2019-12-04 NOTE — Telephone Encounter (Signed)
Happy to be available. Thanks. GM

## 2019-12-04 NOTE — Telephone Encounter (Signed)
Great ,  and I see Dr. Rush Landmark will be doing the procedures since he has sooner opening than Dr. Fuller Plan ( Patient's primary GI). I did discuss case with Dr. Fuller Plan  earlier today.  Thanks everyone.  PG

## 2019-12-04 NOTE — Telephone Encounter (Signed)
Spoke with the patient. He denies any overt bleeding, abd pain, vomiting or black stools. Agrees to return for additional labs but cannot come until next week.

## 2019-12-04 NOTE — Telephone Encounter (Signed)
-----   Message from Willia Craze, NP sent at 12/03/2019 11:14 PM EDT ----- Looking back , his previous CBC was a year ago so this could be chronic GI blood loss on Eliquis instead of an acute bleed but will see what patient has to say about any overt bleeding. Beth please ask patient to come in for ferritin and TIBC. If iron deficient will start on iron to hopefully keep up counts until time of procedures. I will see if I can get the EGD / colonoscopy moved closer but the schedules have been full. Thanks Belenda Cruise and [employer].

## 2019-12-05 ENCOUNTER — Telehealth: Payer: Self-pay

## 2019-12-05 NOTE — Telephone Encounter (Signed)
Messagel received from April, assistant to Dr Lucky Cowboy at [facility] and Vascular Surgery. She will present the request for clearance to hold Eliquis for 2 days prior to the EGD/colonoscopy on 12/11/19 to the provider.  Faxed a new letter to the office at [phone] per her request.

## 2019-12-05 NOTE — Telephone Encounter (Signed)
November 20, 2019 Algernon Huxley, MD to Audrea Muscat, CMA     9:06 AM He can stop Eliquis two days prior to the procedure and restart the day after the procedure

## 2019-12-08 ENCOUNTER — Other Ambulatory Visit (INDEPENDENT_AMBULATORY_CARE_PROVIDER_SITE_OTHER): Payer: Medicare Other

## 2019-12-08 DIAGNOSIS — D500 Iron deficiency anemia secondary to blood loss (chronic): Secondary | ICD-10-CM

## 2019-12-08 LAB — CBC WITH DIFFERENTIAL/PLATELET
Basophils Absolute: 0.1 10*3/uL (ref 0.0–0.1)
Basophils Relative: 1.3 % (ref 0.0–3.0)
Eosinophils Absolute: 0.6 10*3/uL (ref 0.0–0.7)
Eosinophils Relative: 6.3 % — ABNORMAL HIGH (ref 0.0–5.0)
HCT: 26.1 % — ABNORMAL LOW (ref 39.0–52.0)
Hemoglobin: 8.5 g/dL — ABNORMAL LOW (ref 13.0–17.0)
Lymphocytes Relative: 17.1 % (ref 12.0–46.0)
Lymphs Abs: 1.6 10*3/uL (ref 0.7–4.0)
MCHC: 32.5 g/dL (ref 30.0–36.0)
MCV: 79.8 fl (ref 78.0–100.0)
Monocytes Absolute: 0.6 10*3/uL (ref 0.1–1.0)
Monocytes Relative: 6.3 % (ref 3.0–12.0)
Neutro Abs: 6.3 10*3/uL (ref 1.4–7.7)
Neutrophils Relative %: 69 % (ref 43.0–77.0)
Platelets: 292 10*3/uL (ref 150.0–400.0)
RBC: 3.27 Mil/uL — ABNORMAL LOW (ref 4.22–5.81)
RDW: 17.2 % — ABNORMAL HIGH (ref 11.5–15.5)
WBC: 9.2 10*3/uL (ref 4.0–10.5)

## 2019-12-08 LAB — IBC PANEL
Iron: 15 ug/dL — ABNORMAL LOW (ref 42–165)
Saturation Ratios: 3.1 % — ABNORMAL LOW (ref 20.0–50.0)
Transferrin: 348 mg/dL (ref 212.0–360.0)

## 2019-12-09 LAB — IRON,TIBC AND FERRITIN PANEL
%SAT: 3 % (calc) — ABNORMAL LOW (ref 20–48)
Ferritin: 6 ng/mL — ABNORMAL LOW (ref 24–380)
Iron: 14 ug/dL — ABNORMAL LOW (ref 50–180)
TIBC: 423 mcg/dL (calc) (ref 250–425)

## 2019-12-11 ENCOUNTER — Other Ambulatory Visit: Payer: Self-pay

## 2019-12-11 ENCOUNTER — Ambulatory Visit (AMBULATORY_SURGERY_CENTER): Payer: Medicare Other | Admitting: Gastroenterology

## 2019-12-11 ENCOUNTER — Encounter: Payer: Self-pay | Admitting: Gastroenterology

## 2019-12-11 VITALS — BP 146/76 | HR 62 | Temp 97.6°F | Resp 18 | Ht 65.25 in | Wt 166.0 lb

## 2019-12-11 DIAGNOSIS — K219 Gastro-esophageal reflux disease without esophagitis: Secondary | ICD-10-CM

## 2019-12-11 DIAGNOSIS — K5730 Diverticulosis of large intestine without perforation or abscess without bleeding: Secondary | ICD-10-CM | POA: Diagnosis not present

## 2019-12-11 DIAGNOSIS — R1310 Dysphagia, unspecified: Secondary | ICD-10-CM | POA: Diagnosis not present

## 2019-12-11 DIAGNOSIS — K259 Gastric ulcer, unspecified as acute or chronic, without hemorrhage or perforation: Secondary | ICD-10-CM

## 2019-12-11 DIAGNOSIS — K31819 Angiodysplasia of stomach and duodenum without bleeding: Secondary | ICD-10-CM | POA: Diagnosis not present

## 2019-12-11 DIAGNOSIS — D509 Iron deficiency anemia, unspecified: Secondary | ICD-10-CM | POA: Diagnosis not present

## 2019-12-11 DIAGNOSIS — K641 Second degree hemorrhoids: Secondary | ICD-10-CM | POA: Diagnosis not present

## 2019-12-11 DIAGNOSIS — D122 Benign neoplasm of ascending colon: Secondary | ICD-10-CM | POA: Diagnosis not present

## 2019-12-11 DIAGNOSIS — D123 Benign neoplasm of transverse colon: Secondary | ICD-10-CM

## 2019-12-11 DIAGNOSIS — R9389 Abnormal findings on diagnostic imaging of other specified body structures: Secondary | ICD-10-CM

## 2019-12-11 DIAGNOSIS — D128 Benign neoplasm of rectum: Secondary | ICD-10-CM

## 2019-12-11 DIAGNOSIS — K2970 Gastritis, unspecified, without bleeding: Secondary | ICD-10-CM

## 2019-12-11 DIAGNOSIS — Z1211 Encounter for screening for malignant neoplasm of colon: Secondary | ICD-10-CM | POA: Diagnosis not present

## 2019-12-11 DIAGNOSIS — Z86010 Personal history of colonic polyps: Secondary | ICD-10-CM

## 2019-12-11 DIAGNOSIS — K2990 Gastroduodenitis, unspecified, without bleeding: Secondary | ICD-10-CM

## 2019-12-11 DIAGNOSIS — B9681 Helicobacter pylori [H. pylori] as the cause of diseases classified elsewhere: Secondary | ICD-10-CM

## 2019-12-11 DIAGNOSIS — D649 Anemia, unspecified: Secondary | ICD-10-CM | POA: Diagnosis not present

## 2019-12-11 DIAGNOSIS — K2950 Unspecified chronic gastritis without bleeding: Secondary | ICD-10-CM | POA: Diagnosis not present

## 2019-12-11 MED ORDER — FERROUS GLUCONATE 324 (38 FE) MG PO TABS: 324.0000 mg | ORAL_TABLET | Freq: Every day | ORAL | 3 refills | Status: DC

## 2019-12-11 MED ORDER — SODIUM CHLORIDE 0.9 % IV SOLN: 500.0000 mL | Freq: Once | INTRAVENOUS | Status: DC

## 2019-12-11 NOTE — Op Note (Signed)
[facility] Patient Name: Antonio [name] Procedure Date: 12/11/2019 10:29 AM MRN: [id number] Endoscopist: Justice Britain , MD Age: 74 Referring MD:  Date of Birth: 24-Jan-1946 Gender: Male Account #: [id number] Procedure:                Colonoscopy Indications:              Surveillance: Personal history of adenomatous                            polyps on last colonoscopy 5 years ago, Incidental                            - Iron deficiency anemia Medicines:                Monitored Anesthesia Care Procedure:                Pre-Anesthesia Assessment:                           - Prior to the procedure, a History and Physical                            was performed, and patient medications and                            allergies were reviewed. The patient's tolerance of                            previous anesthesia was also reviewed. The risks                            and benefits of the procedure and the sedation                            options and risks were discussed with the patient.                            All questions were answered, and informed consent                            was obtained. Prior Anticoagulants: The patient has                            taken Eliquis (apixaban), last dose was 3 days                            prior to procedure. ASA Grade Assessment: III - A                            patient with severe systemic disease. After                            reviewing the risks and benefits, the patient was  deemed in satisfactory condition to undergo the                            procedure.                           After obtaining informed consent, the colonoscope                            was passed under direct vision. Throughout the                            procedure, the patient's blood pressure, pulse, and                            oxygen saturations were monitored continuously. The                             Colonoscope was introduced through the anus and                            advanced to the 5 cm into the ileum. The                            colonoscopy was performed without difficulty. The                            patient tolerated the procedure. The quality of the                            bowel preparation was good. The terminal ileum,                            ileocecal valve, appendiceal orifice, and rectum                            were photographed. Scope In: 10:52:02 AM Scope Out: 11:09:56 AM Scope Withdrawal Time: 0 hours 14 minutes 55 seconds  Total Procedure Duration: 0 hours 17 minutes 54 seconds  Findings:                 The digital rectal exam findings include                            hemorrhoids. Pertinent negatives include no                            palpable rectal lesions.                           The terminal ileum and ileocecal valve appeared                            normal.                             Three sessile polyps were found in the rectum,                            transverse colon and ascending colon. The polyps                            were 2 to 8 mm in size. These polyps were removed                            with a cold snare. Resection and retrieval were                            complete.                           A few small-mouthed diverticula were found in the                            ascending colon.                           Normal mucosa was found in the entire colon                            otherwise.                           Non-bleeding non-thrombosed external and internal                            hemorrhoids were found during retroflexion, during                            perianal exam and during digital exam. The                            hemorrhoids were Grade II (internal hemorrhoids                            that prolapse but reduce spontaneously). Complications:            No immediate  complications. Estimated Blood Loss:     Estimated blood loss was minimal. Impression:               - Hemorrhoids found on digital rectal exam.                           - The examined portion of the ileum was normal.                           - Three 2 to 8 mm polyps in the rectum, in the                            transverse colon and in the ascending colon,  removed with a cold snare. Resected and retrieved.                           - Diverticulosis in the ascending colon.                           - Normal mucosa in the entire examined colon                            otherwise.                           - Non-bleeding non-thrombosed external and internal                            hemorrhoids. Recommendation:           - The patient will be observed post-procedure,                            until all discharge criteria are met.                           - Discharge patient to home.                           - Patient has a contact number available for                            emergencies. The signs and symptoms of potential                            delayed complications were discussed with the                            patient. Return to normal activities tomorrow.                            Written discharge instructions were provided to the                            patient.                           - High fiber diet.                           - Use FiberCon 1-2 tablets PO daily.                           - Continue present medications.                           - May restart Eliquis in 48 hours (11/6 PM) in                            effort of decreasing bleeding  post-interventions                            today.                           - Initiate Ferrous gluconate 324 mg daily.                           - Plan for repeat CBC/Iron/TIBC/Ferritin to be                            obtained in 4-6 weeks.                           - As per EGD  notation, the patient needs an EGD for                            AVM ablation. Will discuss with his primary Dr.                            Fuller Plan as to whether he would want to set this up or                            have a VCE prior to setting up a SBE.                           - Repeat colonoscopy likely in 3-5 years based on                            final pathology and prior history of colon polyps.                           - The findings and recommendations were discussed                            with the patient.                           - The findings and recommendations were discussed                            with the patient's family. Justice Britain, MD 12/11/2019 11:28:03 AM

## 2019-12-11 NOTE — Discharge Instructions (Addendum)
Resume previous medications. Restart eloquis 12/13/2019 in the evening Increase Protonix to twice daily for two months then decrease to once daily thererafter. Start taking FiberCon 1-2 tablets by mouth daily. (over the counter) Start Ferrous gluconate 324mg  daily -prescription sent. Repeat cbc/iron/TIBC/Ferritin labs in 4-6 weeks Handouts on findings given to patient. Await pathology for final recommendations.  Today's Diet -NOTHING BY MOUTH until 1220pm -Clear liquids 1220pm-120pm -Soft Foods 120pm until tomorrow  YOU HAD AN ENDOSCOPIC PROCEDURE TODAY AT [place]:   Refer to the procedure report that was given to you for any specific questions about what was found during the examination.  If the procedure report does not answer your questions, please call your gastroenterologist to clarify.  If you requested that your care partner not be given the details of your procedure findings, then the procedure report has been included in a sealed envelope for you to review at your convenience later.  YOU SHOULD EXPECT: Some feelings of bloating in the abdomen. Passage of more gas than usual.  Walking can help get rid of the air that was put into your GI tract during the procedure and reduce the bloating. If you had a lower endoscopy (such as a colonoscopy or flexible sigmoidoscopy) you may notice spotting of blood in your stool or on the toilet paper. If you underwent a bowel prep for your procedure, you may not have a normal bowel movement for a few days.  Please Note:  You might notice some irritation and congestion in your nose or some drainage.  This is from the oxygen used during your procedure.  There is no need for concern and it should clear up in a day or so.  SYMPTOMS TO REPORT IMMEDIATELY:   Following lower endoscopy (colonoscopy or flexible sigmoidoscopy):  Excessive amounts of blood in the stool  Significant tenderness or worsening of abdominal pains  Swelling of  the abdomen that is new, acute  Fever of 100F or higher   Following upper endoscopy (EGD)  Vomiting of blood or coffee ground material  New chest pain or pain under the shoulder blades  Painful or persistently difficult swallowing  New shortness of breath  Fever of 100F or higher  Black, tarry-looking stools  For urgent or emergent issues, a gastroenterologist can be reached at any hour by calling [phone]. Do not use MyChart messaging for urgent concerns.    DIET:  We do recommend a small meal at first, but then you may proceed to your regular diet.  Drink plenty of fluids but you should avoid alcoholic beverages for 24 hours.  ACTIVITY:  You should plan to take it easy for the rest of today and you should NOT DRIVE or use heavy machinery until tomorrow (because of the sedation medicines used during the test).    FOLLOW UP: Our staff will call the number listed on your records 48-72 hours following your procedure to check on you and address any questions or concerns that you may have regarding the information given to you following your procedure. If we do not reach you, we will leave a message.  We will attempt to reach you two times.  During this call, we will ask if you have developed any symptoms of COVID 19. If you develop any symptoms (ie: fever, flu-like symptoms, shortness of breath, cough etc.) before then, please call [phone].  If you test positive for Covid 19 in the 2 weeks post procedure, please call and report this information to  [place].    If any biopsies were taken you will be contacted by phone or by letter within the next 1-3 weeks.  Please call us at [phone] if you have not heard about the biopsies in 3 weeks.    SIGNATURES/CONFIDENTIALITY: You and/or your care partner have signed paperwork which will be entered into your electronic medical record.  These signatures attest to the fact that that the information above on your After Visit Summary has been  reviewed and is understood.  Full responsibility of the confidentiality of this discharge information lies with you and/or your care-partner.

## 2019-12-11 NOTE — Progress Notes (Signed)
robinol antisialogogue lidocaine buffer

## 2019-12-11 NOTE — Progress Notes (Signed)
VS-CW 

## 2019-12-11 NOTE — Progress Notes (Signed)
A and O x3. Report to RN. Tolerated MAC anesthesia well.Teeth unchanged after procedure.

## 2019-12-11 NOTE — Progress Notes (Signed)
Called to room to assist during endoscopic procedure.  Patient ID and intended procedure confirmed with present staff. Received instructions for my participation in the procedure from the performing physician.  

## 2019-12-11 NOTE — Op Note (Signed)
[place] Patient Name: Antonio [name] Procedure Date: 12/11/2019 10:30 AM MRN: [id number] Endoscopist: Justice Britain , MD Age: 74 Referring MD:  Date of Birth: 01-20-1946 Gender: Male Account #: [id number] Procedure:                Upper GI endoscopy Indications:              Iron deficiency anemia, Dysphagia Medicines:                Monitored Anesthesia Care Procedure:                Pre-Anesthesia Assessment:                           - Prior to the procedure, a History and Physical                            was performed, and patient medications and                            allergies were reviewed. The patient's tolerance of                            previous anesthesia was also reviewed. The risks                            and benefits of the procedure and the sedation                            options and risks were discussed with the patient.                            All questions were answered, and informed consent                            was obtained. Prior Anticoagulants: The patient has                            taken Eliquis (apixaban), last dose was 3 days                            prior to procedure as well as continued aspirin.                            ASA Grade Assessment: III - A patient with severe                            systemic disease. After reviewing the risks and                            benefits, the patient was deemed in satisfactory                            condition to undergo the procedure.  After obtaining informed consent, the endoscope was                            passed under direct vision. Throughout the                            procedure, the patient's blood pressure, pulse, and                            oxygen saturations were monitored continuously. The                            Endoscope was introduced through the mouth, and                            advanced to the second part  of duodenum. The upper                            GI endoscopy was accomplished without difficulty.                            The patient tolerated the procedure. Scope In: Scope Out: Findings:                 The examined esophagus was moderately tortuous.                           No gross mucosal lesions were noted in the entire                            esophagus. Biopsies were taken with a cold forceps                            for histology. After the rest of the EGD was                            complete, a guidewire was placed and the scope was                            withdrawn. Dilation was performed with a Savary                            dilator with no resistance at 16 mm and mild                            resistance at 18 mm. The dilation site was examined                            following endoscope reinsertion and showed no                            change.  Hematin (altered blood/coffee-ground-like material)                            was found in the gastric body. Lavage of the area                            was performed using a moderate amount, resulting in                            clearance with adequate visualization and no active                            oozing noted.                           Patchy mild inflammation characterized by erosions                            and erythema was found in the gastric body and in                            the gastric antrum.                           A single small angioectasia with no bleeding was                            found on the lesser curvature of the stomach.                           No other gross lesions were noted in the entire                            examined stomach. Biopsies were taken with a cold                            forceps for histology and Helicobacter pylori                            testing.                           No gross lesions were noted in the  duodenal bulb,                            in the first portion of the duodenum and in the                            second portion of the duodenum. Biopsies for                            histology were taken with a cold forceps for  evaluation of celiac disease. Complications:            No immediate complications. Estimated Blood Loss:     Estimated blood loss was minimal. Impression:               - Tortuous esophagus. No gross mucosal lesions in                            esophagus. Biopsied. Dilated.                           - Hematin (altered blood/coffee-ground-like                            material) in the gastric body - lavaged away.                            Gastritis. A single non-bleeding angioectasia in                            the stomach. Biopsied stomach.                           - No gross lesions in the duodenal bulb, in the                            first portion of the duodenum and in the second                            portion of the duodenum. Biopsied. Recommendation:           - Proceed to scheduled colonoscopy.                           - Dilation diet as per protocol.                           - Await pathology results.                           - Continue present medications.                           - Increase PPI to twice daily for next 59-months and                            then may decrease back down to once daily                            thereafter.                           - Patient will need an EGD in the Hospital-based                            setting for ablation of the Gastric AVM in effort  of decreasing risk of chronic blood loss anemia.                           - The findings and recommendations were discussed                            with the patient.                           - The findings and recommendations were discussed                            with the patient's  family. Justice Britain, MD 12/11/2019 11:20:38 AM

## 2019-12-12 ENCOUNTER — Other Ambulatory Visit: Payer: Self-pay

## 2019-12-12 DIAGNOSIS — K31819 Angiodysplasia of stomach and duodenum without bleeding: Secondary | ICD-10-CM

## 2019-12-15 ENCOUNTER — Telehealth: Payer: Self-pay

## 2019-12-15 NOTE — Telephone Encounter (Signed)
°  Follow up Call-  Call back number 12/11/2019 10/22/2018  Post procedure Call Back phone  # [phone]  Permission to leave phone message No Yes  comments no voicemail -  Some recent data might be hidden     Patient questions:  Do you have a fever, pain , or abdominal swelling? No. Pain Score  0 *  Have you tolerated food without any problems? Yes.    Have you been able to return to your normal activities? Yes.    Do you have any questions about your discharge instructions: Diet   No. Medications  No. Follow up visit  No.  Do you have questions or concerns about your Care? No.  Actions: * If pain score is 4 or above: No action needed, pain <4. 1. Have you developed a fever since your procedure? no  2.   Have you had an respiratory symptoms (SOB or cough) since your procedure? no  3.   Have you tested positive for COVID 19 since your procedure no  4.   Have you had any family members/close contacts diagnosed with the COVID 19 since your procedure?  no   If yes to any of these questions please route to Joylene Jamill, RN and Joella Prince, RN

## 2019-12-16 ENCOUNTER — Other Ambulatory Visit: Payer: Self-pay

## 2019-12-16 ENCOUNTER — Ambulatory Visit (INDEPENDENT_AMBULATORY_CARE_PROVIDER_SITE_OTHER): Payer: Medicare Other

## 2019-12-16 VITALS — BP 122/60 | HR 68 | Ht 65.25 in | Wt 167.0 lb

## 2019-12-16 DIAGNOSIS — I10 Essential (primary) hypertension: Secondary | ICD-10-CM | POA: Diagnosis not present

## 2019-12-16 NOTE — Progress Notes (Addendum)
Patient here for follow-up of blood pressure. Patient denies chest pain, fatigue and near-syncope.  Vitals:   12/16/19 1555  BP: 122/60  Pulse: 68  Height: 5' 5.25" (1.657 m)  Weight: 167 lb (75.8 kg)  SpO2: 97%  BMI (Calculated): 27.59

## 2019-12-17 ENCOUNTER — Other Ambulatory Visit: Payer: Self-pay

## 2019-12-17 ENCOUNTER — Telehealth: Payer: Self-pay

## 2019-12-17 DIAGNOSIS — K31819 Angiodysplasia of stomach and duodenum without bleeding: Secondary | ICD-10-CM

## 2019-12-17 DIAGNOSIS — D509 Iron deficiency anemia, unspecified: Secondary | ICD-10-CM

## 2019-12-17 NOTE — Telephone Encounter (Signed)
° °  Antonio [name] [birth year] [id number]  Dear Dr. Lucky Cowboy:  We have scheduled the above named patient for a(n) Small bowel enteroscopy procedure. Our records show that (s)he is on anticoagulation therapy.  Please advise as to whether the patient may come off their therapy of Eliquis 2 days prior to their procedure which is scheduled for 02/23/20.  Please route your response to Dorisann Frames, CMA or fax response to [phone].  Sincerely,    [facility]

## 2019-12-17 NOTE — Telephone Encounter (Signed)
-----   Message from Ladene Artist, MD sent at 12/15/2019  8:50 AM EST ----- Regarding: RE: Hospital-based procedure needed He can wait to January. If we have a cancellation in Dec we could move him to Dec. Thx.  ----- Message ----- From: Lindon Romp, CMA Sent: 12/15/2019   8:25 AM EST To: Ladene Artist, MD Subject: RE: Hospital-based procedure needed            Good morning, Your hospital day in December is full. Can this patient wait for you next available procedure day in January or February? ----- Message ----- From: Ladene Artist, MD Sent: 12/15/2019   7:42 AM EST To: Lindon Romp, CMA, Willia Craze, NP, # Subject: RE: Hospital-based procedure needed            Gabe,  We will schedule SBE with AVM ablation at [facility]. I'll review the biopsies when available.   Thank you,  Norberto Sorenson   ----- Message ----- From: Irving Copas., MD Sent: 12/13/2019   3:32 PM EST To: Ladene Artist, MD, Willia Craze, NP Subject: Hospital-based procedure needed                Norberto Sorenson and Paula,I performed the upper and lower endoscopy in the setting of this patient's risk of iron deficiency anemia.My pathology is pending but he had a nice gastric AVM.  No other AVMs noted in the duodenum that was visualized but certainly he could be at risk of other AVMs in the small bowel.I think the next step for this patient (after my biopsies also return) will be for an EGD versus SBE with AVM ablation.I did not think that you would want a video capsule first.Let me know if you are able to accommodate this or if you need me to get the procedure done in the hospital depending on how your schedule is.I will update you when the final pathology also returns.All the best.GM

## 2019-12-17 NOTE — Telephone Encounter (Signed)
Spoke with patient and scheduled him for a small bowel enteroscopy with ablation on 02/23/20 at 7:30am. Scheduled patient's covid test for 02/19/20 at [facility]. Patient is on Eliquis. Will ask prescribing provider if patient can hold prior to procedure. Informed patient that I will put instructions on my chart for him to review. Patient states he does not use the Internet. Informed patient I will mail all his instructions for the prep and testing date and times. Informed patient to contact our office back if he has any questions once he receives the instructions. Patient agreed and verbalized understanding. Also informed patient I will contact him once I hear back from his prescribing doctor regarding holding his Eliquis prior to his procedure. Patient verbalized understanding.

## 2019-12-18 ENCOUNTER — Encounter: Payer: Self-pay | Admitting: Gastroenterology

## 2019-12-18 ENCOUNTER — Other Ambulatory Visit: Payer: Self-pay

## 2019-12-18 MED ORDER — AMOXICILLIN 500 MG PO TABS: 1000.0000 mg | ORAL_TABLET | Freq: Two times a day (BID) | ORAL | 0 refills | Status: AC

## 2019-12-18 MED ORDER — METRONIDAZOLE 500 MG PO TABS: 500.0000 mg | ORAL_TABLET | Freq: Two times a day (BID) | ORAL | 0 refills | Status: AC

## 2019-12-18 MED ORDER — CLARITHROMYCIN 500 MG PO TABS: 500.0000 mg | ORAL_TABLET | Freq: Two times a day (BID) | ORAL | 0 refills | Status: AC

## 2019-12-18 MED ORDER — OMEPRAZOLE 20 MG PO CPDR: 20.0000 mg | DELAYED_RELEASE_CAPSULE | Freq: Two times a day (BID) | ORAL | 0 refills | Status: DC

## 2019-12-18 NOTE — Telephone Encounter (Signed)
Patient informed to hold Eliquis 2 days prior to his procedure per Dr. Lucky Cowboy. Patient agreed and verbalized understanding.

## 2019-12-19 ENCOUNTER — Ambulatory Visit (INDEPENDENT_AMBULATORY_CARE_PROVIDER_SITE_OTHER): Payer: Medicare Other | Admitting: Primary Care

## 2019-12-19 ENCOUNTER — Other Ambulatory Visit: Payer: Self-pay

## 2019-12-19 ENCOUNTER — Encounter: Payer: Self-pay | Admitting: Primary Care

## 2019-12-19 VITALS — BP 124/62 | HR 62 | Temp 97.6°F | Ht 65.0 in | Wt 167.0 lb

## 2019-12-19 DIAGNOSIS — R21 Rash and other nonspecific skin eruption: Secondary | ICD-10-CM | POA: Insufficient documentation

## 2019-12-19 MED ORDER — PREDNISONE 20 MG PO TABS: ORAL_TABLET | ORAL | 0 refills | Status: DC

## 2019-12-19 NOTE — Progress Notes (Signed)
Subjective:    Patient ID: Antonio [name], male    DOB: 08-28-1945, 74 y.o.   MRN: [id number]  HPI  This visit occurred during the SARS-CoV-2 public health emergency.  Safety protocols were in place, including screening questions prior to the visit, additional usage of staff PPE, and extensive cleaning of exam room while observing appropriate contact time as indicated for disinfecting solutions.   Antonio [name] is a 74 year old male with a history of coronary atherosclerosis, hypertension, PAD, ischemic leg, type 2 diabetes, BPH, hyperlipidemia who presents today with a chief complaint of rash.  His rash is located to the bilateral lower extremities that began around the ankles and lower portion of the lower leg. He noticed about 2-3 weeks ago. Prior to the rash, his daughter gave him some new socks. He washed the socks prior to wearing, but noticed the rash and itching just after using. He's not worn the socks in a few weeks. He typically has dry skin.   No new lotions, detergents, soaps or shampoos. No new medicines, vitamins, supplements. No new pets. No recent outdoor exposure or poison ivy exposure. No bonfire or smoke exposure.  No recent motel or hotel stay or new beds.   No fevers/chills, oral lesions, new joint pains, tick bites, abdominal pain, nausea.   He's used triple antibiotic, neosporin, lotion without improvement in rash but temporary improvement in itching. He believes that the rash is spreading up his legs now. He underwent a colonoscopy on 12/11/19, the rash was present at the time, not as bad.   He denies changes in foot color, pain, wheezing, throat tightness. He was recently prescribed oral antibiotics for new H pylori diagnosis, has yet to pick up these prescriptions.   BP Readings from Last 3 Encounters:  12/19/19 124/62  12/16/19 122/60  12/11/19 (!) 146/76     Review of Systems  Constitutional: Negative for fever.  Respiratory: Negative for shortness of  breath and wheezing.   Skin: Positive for rash. Negative for color change.       Past Medical History:  Diagnosis Date  . Arthritis   . Asthma    age 83  . 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 Coronary atherosclerosis of unspecified type of vessel, native or graft   . Diabetes mellitus    Type II  . Emphysema of lung ([place])   . Esophageal stricture   . Gastritis   . GERD (gastroesophageal reflux disease)   . Hiatal hernia   . Hx of colonic polyps   . Hyperlipidemia   . Hypertension   . Internal hemorrhoids without mention of complication   . Lumbago   . Pain in both lower legs   . PSA elevation    now averaging 2's  . Tobacco abuse   . URI (upper respiratory infection)   . Urinary retention      Social History   Socioeconomic History  . Marital status: Married    Spouse name: [name]  . Number of children: 3  . Years of education: Not on file  . Highest education level: Not on file  Occupational History  . Occupation: Retired Games developer  Tobacco Use  . Smoking status: Current Every Day Smoker    Packs/day: 1.50    Years: 57.00    Pack years: 85.50    Types: Cigarettes  . Smokeless tobacco: Former Systems developer    Types: Chew  . Tobacco comment: ready to quit  wants to talk to [facility] at appointment  Vaping Use  . Vaping Use: Never used  Substance and Sexual Activity  . Alcohol use: Not Currently    Alcohol/week: 0.0 standard drinks    Comment: rare  . Drug use: No  . Sexual activity: Yes  Other Topics Concern  . Not on file  Social History Narrative   Married 9/09 wife with moderate memory problems.   3 adult children, 2 grandchildren   Would desire CPR   Social Determinants of Health   Financial Resource Strain:   . Difficulty of Paying Living Expenses: Not on file  Food Insecurity:   . Worried About Charity fundraiser in the Last Year: Not on file  . Ran Out of Food in the Last Year: Not on file  Transportation Needs:   . Lack of  Transportation (Medical): Not on file  . Lack of Transportation (Non-Medical): Not on file  Physical Activity:   . Days of Exercise per Week: Not on file  . Minutes of Exercise per Session: Not on file  Stress:   . Feeling of Stress : Not on file  Social Connections:   . Frequency of Communication with Friends and Family: Not on file  . Frequency of Social Gatherings with Friends and Family: Not on file  . Attends Religious Services: Not on file  . Active Member of Clubs or Organizations: Not on file  . Attends Archivist Meetings: Not on file  . Marital Status: Not on file  Intimate Partner Violence:   . Fear of Current or Ex-Partner: Not on file  . Emotionally Abused: Not on file  . Physically Abused: Not on file  . Sexually Abused: Not on file    Past Surgical History:  Procedure Laterality Date  . 2D Echo  06/03  . Abdominal ultrasound  06/03   Negative  . BACK SURGERY    . COLONOSCOPY    . CT of head  and sinuses  06/03   Negative  . CT of the chest, abdomen, pelvis  06/03   Chest negative; Abdomen, left adrenal lesion; Pelvis, enlarged prostate  . ESOPHAGOGASTRODUODENOSCOPY  02/13/07   gastritis and duodenitis without bleed  . ESOPHAGOGASTRODUODENOSCOPY N/A 06/26/2014   Procedure: ESOPHAGOGASTRODUODENOSCOPY (EGD);  Surgeon: Milus Banister, MD;  Location: [facility];  Service: Endoscopy;  Laterality: N/A;  . KNEE ARTHROSCOPY  10/00   Right  . LOWER EXTREMITY ANGIOGRAPHY Right 12/27/2017   Procedure: LOWER EXTREMITY ANGIOGRAPHY;  Surgeon: Algernon Huxley, MD;  Location: [facility];  Service: Cardiovascular;  Laterality: Right;  . LOWER EXTREMITY ANGIOGRAPHY Left 01/10/2018   Procedure: LOWER EXTREMITY ANGIOGRAPHY;  Surgeon: Algernon Huxley, MD;  Location: [place] [facility];  Service: Cardiovascular;  Laterality: Left;  . LOWER EXTREMITY ANGIOGRAPHY Left 05/29/2018   Procedure: LOWER EXTREMITY ANGIOGRAPHY;  Surgeon: Algernon Huxley, MD;  Location: [facility];  Service: Cardiovascular;  Laterality: Left;  . LOWER EXTREMITY ANGIOGRAPHY Left 05/30/2018   Procedure: Lower Extremity Angiography;  Surgeon: Algernon Huxley, MD;  Location: [place] [facility];  Service: Cardiovascular;  Laterality: Left;  . LP  06/03  . Microwave thermotherapy prostate  08/28/07   [facility] Blocker  . Persantine cardiolite  03/01   EF 68%  . POLYPECTOMY  11/95  . Stress myoview  07/06   EF 67%  . UPPER GASTROINTESTINAL ENDOSCOPY      Family History  Problem Relation Age of Onset  . Diabetes  Father   . Alcohol abuse Paternal Uncle   . Alcohol abuse Paternal Uncle   . Alcohol abuse Paternal Uncle   . Rheum arthritis Mother   . Heart disease Neg Hx   . Stroke Neg Hx   . Cancer Neg Hx   . Drug abuse Neg Hx   . Depression Neg Hx   . Colon cancer Neg Hx   . Rectal cancer Neg Hx   . Stomach cancer Neg Hx   . Kidney cancer Neg Hx   . Bladder Cancer Neg Hx   . Prostate cancer Neg Hx     No Known Allergies  Current Outpatient Medications on File Prior to Visit  Medication Sig Dispense Refill  . acetaminophen (TYLENOL) 500 MG tablet Take 1,000 mg by mouth in the morning and at bedtime.    [name] amLODipine (NORVASC) 5 MG tablet Take by mouth.    [name] aspirin 81 MG tablet Take 81 mg by mouth daily.    [name] atorvastatin (LIPITOR) 20 MG tablet TAKE 1 TABLET BY MOUTH EVERY EVENING FOR CHOLESTEROL 90 tablet 1  . ELIQUIS 5 MG TABS tablet TAKE 1 TABLET BY MOUTH TWICE A DAY 60 tablet 4  . ferrous gluconate (FERGON) 324 MG tablet Take 1 tablet (324 mg total) by mouth daily with breakfast. 30 tablet 3  . finasteride (PROSCAR) 5 MG tablet Take 1 tablet (5 mg total) by mouth daily. 90 tablet 3  . glipiZIDE (GLUCOTROL) 10 MG tablet Take 10 mg by mouth daily before supper.     . insulin detemir (LEVEMIR) 100 UNIT/ML injection Inject 20 units at bedtime. (Patient taking differently: every morning. Inject 15 units at breakfast) 10 pen 5  . metFORMIN (GLUCOPHAGE) 500 MG tablet TAKE 2  TABLETS BY MOUTH TWICE DAILY WITH A MEAL FOR DIABETES. 360 tablet 3  . metoprolol tartrate (LOPRESSOR) 50 MG tablet TAKE 1/2 TABLET BY MOUTH TWICE A DAY 90 tablet 1  . omeprazole (PRILOSEC) 20 MG capsule Take 1 capsule (20 mg total) by mouth 2 (two) times daily before a meal for 10 days. 20 capsule 0  . OVER THE COUNTER MEDICATION Glucosomine one tablet daily.    . tamsulosin (FLOMAX) 0.4 MG CAPS capsule Take 2 capsules by mouth once daily 180 capsule 3  . amoxicillin (AMOXIL) 500 MG tablet Take 2 tablets (1,000 mg total) by mouth 2 (two) times daily for 10 days. (Patient not taking: Reported on 12/19/2019) 40 tablet 0  . buPROPion (WELLBUTRIN SR) 150 MG 12 hr tablet One tab daily for 5 days, twice a day starting on day 6. (Patient not taking: Reported on 12/02/2019) 60 tablet 1  . clarithromycin (BIAXIN) 500 MG tablet Take 1 tablet (500 mg total) by mouth 2 (two) times daily for 10 days. (Patient not taking: Reported on 12/19/2019) 20 tablet 0  . lisinopril-hydrochlorothiazide (ZESTORETIC) 20-25 MG tablet TAKE 1 TABLET BY MOUTH EVERY DAY FOR BLOOD PRESSURE (Patient not taking: Reported on 12/11/2019) 90 tablet 1  . metroNIDAZOLE (FLAGYL) 500 MG tablet Take 1 tablet (500 mg total) by mouth 2 (two) times daily for 10 days. (Patient not taking: Reported on 12/19/2019) 20 tablet 0  . [DISCONTINUED] pantoprazole (PROTONIX) 40 MG tablet Take 1 tablet (40 mg total) by mouth daily. For heartburn. 90 tablet 0   No current facility-administered medications on file prior to visit.    BP 124/62   Pulse 62   Temp 97.6 F (36.4 C) (Temporal)   Ht 5\' 5"  (1.651 m)  Wt 167 lb (75.8 kg)   SpO2 97%   BMI 27.79 kg/m    Objective:   Physical Exam Cardiovascular:     Comments: Unable to get good feel of pedal pulses, this is a chronic issue for him. Pulmonary:     Effort: Pulmonary effort is normal.  Skin:    General: Skin is warm and dry.     Comments: Dry, flaky skin to bilateral ankles, left worse  than right. Evidence of rash with scratch marks to bilateral lower extremities. No erythema.   Neurological:     Mental Status: He is alert.            Assessment & Plan:

## 2019-12-19 NOTE — Assessment & Plan Note (Signed)
Unclear cause, likely contact irritation from new socks. He has since stopped using.   No cellulitis, ulceration, wounds noted.  Given the wide spread irritation he will need systemic treatment for resolve. Rx for prednisone taper sent to pharmacy.   He will update in a week.

## 2019-12-19 NOTE — Patient Instructions (Addendum)
Please call [facility] to make your follow up.   Start prednisone 20 mg tablets for rash. Take 2 tablets once daily for three days, then 1 tablet once daily for three days for RASH.  Pick up the antibiotics prescribed by your GI doctor.  Please update me in one week.  It was a pleasure to see you today!

## 2019-12-24 ENCOUNTER — Other Ambulatory Visit: Payer: Self-pay | Admitting: Primary Care

## 2019-12-24 DIAGNOSIS — E782 Mixed hyperlipidemia: Secondary | ICD-10-CM

## 2019-12-27 ENCOUNTER — Other Ambulatory Visit: Payer: Self-pay | Admitting: Primary Care

## 2019-12-27 DIAGNOSIS — E11618 Type 2 diabetes mellitus with other diabetic arthropathy: Secondary | ICD-10-CM

## 2019-12-29 ENCOUNTER — Other Ambulatory Visit: Payer: Self-pay | Admitting: Family Medicine

## 2019-12-29 DIAGNOSIS — N138 Other obstructive and reflux uropathy: Secondary | ICD-10-CM

## 2020-01-07 ENCOUNTER — Encounter: Payer: Medicare Other | Admitting: Gastroenterology

## 2020-01-08 ENCOUNTER — Other Ambulatory Visit: Payer: Medicare Other

## 2020-01-12 ENCOUNTER — Ambulatory Visit (INDEPENDENT_AMBULATORY_CARE_PROVIDER_SITE_OTHER): Payer: Medicare Other | Admitting: Family Medicine

## 2020-01-12 ENCOUNTER — Encounter: Payer: Self-pay | Admitting: Family Medicine

## 2020-01-12 ENCOUNTER — Other Ambulatory Visit: Payer: Self-pay

## 2020-01-12 ENCOUNTER — Telehealth: Payer: Self-pay

## 2020-01-12 ENCOUNTER — Ambulatory Visit (INDEPENDENT_AMBULATORY_CARE_PROVIDER_SITE_OTHER)
Admission: RE | Admit: 2020-01-12 | Discharge: 2020-01-12 | Disposition: A | Discharge status: Home / Self Care | Payer: Medicare Other | Source: Ambulatory Visit | Attending: Family Medicine | Admitting: Family Medicine

## 2020-01-12 VITALS — BP 122/80 | HR 69 | Temp 96.9°F | Ht 65.0 in | Wt 160.2 lb

## 2020-01-12 DIAGNOSIS — G8929 Other chronic pain: Secondary | ICD-10-CM

## 2020-01-12 DIAGNOSIS — M25551 Pain in right hip: Secondary | ICD-10-CM | POA: Diagnosis not present

## 2020-01-12 DIAGNOSIS — W19XXXA Unspecified fall, initial encounter: Secondary | ICD-10-CM | POA: Insufficient documentation

## 2020-01-12 DIAGNOSIS — M7918 Myalgia, other site: Secondary | ICD-10-CM | POA: Diagnosis not present

## 2020-01-12 DIAGNOSIS — Y92009 Unspecified place in unspecified non-institutional (private) residence as the place of occurrence of the external cause: Secondary | ICD-10-CM

## 2020-01-12 DIAGNOSIS — R102 Pelvic and perineal pain: Secondary | ICD-10-CM | POA: Diagnosis not present

## 2020-01-12 NOTE — Assessment & Plan Note (Signed)
Per pt-recurrent falls because his R knee "goes out on him" Suspect that use of a cane in the future may be helpful  Per pt-had surgery on that knee in the past Also consider PT for balance and also RLE strength may be something to consider in the future

## 2020-01-12 NOTE — Patient Instructions (Signed)
Use walker as long as you need to  After that consider using a cane on the right side to support the weak knee   Xray of right hip /pelvis   Keep using heat on the right buttock (10 minutes at a time)  Tylenol also

## 2020-01-12 NOTE — Telephone Encounter (Signed)
[place] Night - Client Nonclinical Telephone Record [facility] Client [place] Night - Client Client Site [place] Physician Alma Friendly - NP Contact Type Call Who Is Calling Patient / Member / Family / Caregiver Caller Name Lum Babe Caller Phone Number [phone] Patient Name Antonio [name] Patient DOB 01-Mar-1945 Call Type Message Only Information Provided Reason for Call Request to Schedule Office Appointment Initial Comment Caller states, feel over the weekend. Can barley walk or stand. Using a walker. Additional Comment Caller states, second number is [phone] Caller declined Triage. Disp. Time Disposition Final User 01/12/2020 7:42:35 AM General Information Provided Yes Jobie Quaker Call Closed By: Jobie Quaker Transaction Date/Time: 01/12/2020 7:39:33 AM (ET)

## 2020-01-12 NOTE — Progress Notes (Signed)
Subjective:    Patient ID: Antonio [name], male    DOB: 1946/01/10, 74 y.o.   MRN: [id number]  This visit occurred during the SARS-CoV-2 public health emergency.  Safety protocols were in place, including screening questions prior to the visit, additional usage of staff PPE, and extensive cleaning of exam room while observing appropriate contact time as indicated for disinfecting solutions.    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  HENT: Negative for congestion, rhinorrhea, sore throat and trouble swallowing.   Eyes: Negative for pain, redness, itching and visual disturbance.  Respiratory: Negative for cough, chest tightness, shortness of breath and wheezing.   Cardiovascular: Negative for chest pain and palpitations.  Gastrointestinal: Negative for abdominal pain, blood in stool, constipation, diarrhea and nausea.  Endocrine: Negative for cold intolerance, heat intolerance, polydipsia and polyuria.  Genitourinary: Negative for difficulty urinating, dysuria, frequency and urgency.  Musculoskeletal: Positive for arthralgias, back pain and gait problem. Negative for joint swelling and myalgias.  Skin: Negative for pallor and rash.  Neurological: Negative for dizziness, tremors, weakness, numbness  and headaches.  Hematological: Negative for adenopathy. Does not bruise/bleed easily.  Psychiatric/Behavioral: Negative for decreased concentration and dysphoric mood. The patient is not nervous/anxious.        Lost wife in July  He is adjusting        Objective:   Physical Exam Constitutional:      General: He is not in acute distress.    Appearance: Normal appearance. He is normal weight. He is not ill-appearing.  HENT:     Head: Normocephalic and atraumatic.     Mouth/Throat:     Mouth: Mucous membranes are moist.  Eyes:     Conjunctiva/sclera: Conjunctivae normal.     Pupils: Pupils are equal, round, and reactive to light.  Cardiovascular:     Rate and Rhythm: Normal rate and regular rhythm.  Pulmonary:     Effort: Pulmonary effort is normal.     Breath sounds: Normal breath sounds.  Musculoskeletal:     Cervical back: Normal range of motion and neck supple. No rigidity.     Right hip: Tenderness present. No deformity, lacerations, bony tenderness or crepitus. Decreased range of motion. Normal strength.     Left hip: Normal.     Right upper leg: No swelling, deformity or tenderness.     Right lower leg: No edema.     Left lower leg: No edema.     Comments: R hip  Can bear weight for short periods  Pain with int/ext rotation  Tender over buttock and less  over greater trochanter No bruising or swelling  No spinous process tenderness  Skin:    General: Skin is warm and dry.     Findings: No bruising.  Neurological:     Mental Status: He is alert.     Sensory: No sensory deficit.     Gait: Gait abnormal.     Comments: Gait favors L leg today  Psychiatric:        Mood and Affect: Mood normal.           Assessment & Plan:   Problem List Items Addressed This Visit      Other   Chronic pain of right hip    Worse with a fall 2 days ago -directly on that side  Pain is more lateral than groin (reassuring)  Xray now of hip and pelvis       Relevant Orders    DG Hip Unilat W OR W/O Pelvis 2-3 Views Right   Right buttock pain - Primary    After a fall 2 days ago  Hurts to walk-using walker  No groin pain  Suspect contusion and muscle spasm  Xray of hip /pelvis today to make sure no fracture  Adv to continue heat on buttock to relax muscles (10 minutes at a time)        Relevant Orders   DG Hip Unilat W OR W/O Pelvis 2-3 Views Right   Fall at home, initial encounter    Per pt-recurrent falls because his R knee "goes out on him" Suspect that use of a cane in the future may be helpful  Per pt-had surgery on that knee in the past Also consider PT for balance and also RLE strength may be something to consider in the future

## 2020-01-12 NOTE — Assessment & Plan Note (Signed)
After a fall 2 days ago  Hurts to walk-using walker  No groin pain  Suspect contusion and muscle spasm  Xray of hip /pelvis today to make sure no fracture  Adv to continue heat on buttock to relax muscles (10 minutes at a time)

## 2020-01-12 NOTE — Assessment & Plan Note (Signed)
Worse with a fall 2 days ago -directly on that side  Pain is more lateral than groin (reassuring)  Xray now of hip and pelvis

## 2020-01-12 NOTE — Telephone Encounter (Signed)
I spoke with pt; pt fell on 01/10/20 after rt knee buckling under him; pt fell and hit rt hip,rt elbow, hit head with no laceration but small knot came up which is gone now; pt did not lose consciousness; today worse pain is in rt hip; pt can bear weight on rt leg but holds to things such as walls and railings to steady pt. Pt wants to be seen at [facility] and pt said could be at [facility] at 9:20 am this morning. No covid symptoms. Pt scheduled appt with Dr Glori Bickers today at 9:30 am.

## 2020-01-13 ENCOUNTER — Ambulatory Visit: Payer: Medicare Other | Admitting: Urology

## 2020-01-14 ENCOUNTER — Other Ambulatory Visit: Payer: Medicare Other

## 2020-01-18 ENCOUNTER — Other Ambulatory Visit: Payer: Self-pay | Admitting: Primary Care

## 2020-01-19 NOTE — Progress Notes (Signed)
11:50 AM   Antonio [name] [birth year] [id number]  Referring provider: Pleas Koch, NP [name],  [name] [address]  Chief Complaint  Patient presents with  . 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   History of urinary retention His baseline urinary symptoms are frequency, urgency, nocturia, intermittency and weak urinary stream. He is on maximum medical therapy with tamsulosin 0.8 mg daily and finasteride 5 mg daily.  His urinary symptoms seemed to worsen when he consumes alcohol and he is comfortable with CIC.  He is having to CIC a few times a year.  His PVR was 48 mL today.  BPH WITH LUTS  (prostate and/or bladder) IPSS score: 13/3  PVR: 48 mL    Previous score: 11/2 Previous PVR: 12 mL  Major complaint(s): Frequency and a weak urinary stream.  Patient denies any modifying or aggravating factors.  Patient denies any gross hematuria, dysuria or suprapubic/flank pain.  Patient denies any fevers, chills, nausea or vomiting.   Currently taking: Tamsulosin 0.4 mg 2 capsules daily and finasteride 5 mg daily  His has had TARGIS ~ 10 years ago  He does not have a family history of PCa.   IPSS    Row Name 01/20/20 1100         International Prostate Symptom Score   How often have you had the sensation of not emptying your bladder? About half the time     How often have you had to urinate less than every two hours? Less than half the time     How often have you found you stopped and started again several times when you urinated? Less than half the time     How often have you found it difficult to postpone urination? Less than 1 in 5 times     How often have you had a weak urinary stream? Less than half the time     How often have you  had to strain to start urination? Less than 1 in 5 times     How many times did you typically get up at night to urinate? 2 Times     Total IPSS Score 13           Quality of Life due to urinary symptoms   If you were to spend the rest of your life with your urinary condition just the way it is now how would you feel about that? Mixed            Score:  1-7 Mild 8-19 Moderate 20-35 Severe     PMH: Past Medical History:  Diagnosis Date  . Arthritis   . Asthma    age 73  . 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 Coronary atherosclerosis of unspecified type of vessel, native or graft   . Diabetes mellitus    Type II  . Emphysema of lung ([place])   . Esophageal stricture   . Gastritis   . GERD (gastroesophageal reflux disease)   . Hiatal hernia   . Hx of colonic polyps   . Hyperlipidemia   . Hypertension   . Internal hemorrhoids without mention of complication   . Lumbago   .  Pain in both lower legs   . PSA elevation    now averaging 2's  . Tobacco abuse   . URI (upper respiratory infection)   . Urinary retention     Surgical History: Past Surgical History:  Procedure Laterality Date  . 2D Echo  06/03  . Abdominal ultrasound  06/03   Negative  . BACK SURGERY    . COLONOSCOPY    . CT of head  and sinuses  06/03   Negative  . CT of the chest, abdomen, pelvis  06/03   Chest negative; Abdomen, left adrenal lesion; Pelvis, enlarged prostate  . ESOPHAGOGASTRODUODENOSCOPY  02/13/07   gastritis and duodenitis without bleed  . ESOPHAGOGASTRODUODENOSCOPY N/A 06/26/2014   Procedure: ESOPHAGOGASTRODUODENOSCOPY (EGD);  Surgeon: Milus Banister, MD;  Location: [facility];  Service: Endoscopy;  Laterality: N/A;  . KNEE ARTHROSCOPY  10/00   Right  . LOWER EXTREMITY ANGIOGRAPHY Right 12/27/2017   Procedure: LOWER EXTREMITY ANGIOGRAPHY;  Surgeon: Algernon Huxley, MD;  Location: [facility];  Service: Cardiovascular;  Laterality: Right;  . LOWER  EXTREMITY ANGIOGRAPHY Left 01/10/2018   Procedure: LOWER EXTREMITY ANGIOGRAPHY;  Surgeon: Algernon Huxley, MD;  Location: [facility];  Service: Cardiovascular;  Laterality: Left;  . LOWER EXTREMITY ANGIOGRAPHY Left 05/29/2018   Procedure: LOWER EXTREMITY ANGIOGRAPHY;  Surgeon: Algernon Huxley, MD;  Location: [facility];  Service: Cardiovascular;  Laterality: Left;  . LOWER EXTREMITY ANGIOGRAPHY Left 05/30/2018   Procedure: Lower Extremity Angiography;  Surgeon: Algernon Huxley, MD;  Location: [facility];  Service: Cardiovascular;  Laterality: Left;  . LP  06/03  . Microwave thermotherapy prostate  08/28/07   [place] Blocker  . Persantine cardiolite  03/01   EF 68%  . POLYPECTOMY  11/95  . Stress myoview  07/06   EF 67%  . UPPER GASTROINTESTINAL ENDOSCOPY      Home Medications:  Allergies as of 01/20/2020   No Known Allergies     Medication List       Accurate as of January 20, 2020 11:50 AM. If you have any questions, ask your nurse or doctor.        acetaminophen 500 MG tablet Commonly known as: TYLENOL Take 1,000 mg by mouth in the morning and at bedtime.   amLODipine 5 MG tablet Commonly known as: NORVASC Take by mouth.   aspirin 81 MG tablet Take 81 mg by mouth daily.   atorvastatin 20 MG tablet Commonly known as: LIPITOR TAKE 1 TABLET BY MOUTH EVERY EVENING FOR CHOLESTEROL   buPROPion 150 MG 12 hr tablet Commonly known as: WELLBUTRIN SR One tab daily for 5 days, twice a day starting on day 6.   Eliquis 5 MG Tabs tablet Generic drug: apixaban TAKE 1 TABLET BY MOUTH TWICE A DAY   ferrous gluconate 324 MG tablet Commonly known as: FERGON Take 1 tablet (324 mg total) by mouth daily with breakfast.   finasteride 5 MG tablet Commonly known as: PROSCAR Take 1 tablet (5 mg total) by mouth daily.   glipiZIDE 10 MG tablet Commonly known as: GLUCOTROL Take 10 mg by mouth daily before supper.   insulin detemir 100 UNIT/ML injection Commonly known as:  Levemir Inject 20 units at bedtime. What changed:   when to take this  additional instructions   lisinopril-hydrochlorothiazide 20-25 MG tablet Commonly known as: ZESTORETIC TAKE 1 TABLET BY MOUTH EVERY DAY FOR BLOOD PRESSURE   metFORMIN 500 MG tablet Commonly known as: GLUCOPHAGE  TAKE 2 TABLETS BY MOUTH TWICE A DAY WITH MEAL FOR DIABETES   metoprolol tartrate 50 MG tablet Commonly known as: LOPRESSOR TAKE 1/2 TABLET BY MOUTH TWICE A DAY   omeprazole 20 MG capsule Commonly known as: PRILOSEC Take 1 capsule (20 mg total) by mouth 2 (two) times daily before a meal for 10 days.   OVER THE COUNTER MEDICATION Glucosomine one tablet daily.   tamsulosin 0.4 MG Caps capsule Commonly known as: FLOMAX Take 2 capsules by mouth once daily       Allergies: No Known Allergies  Family History: Family History  Problem Relation Age of Onset  . Diabetes Father   . Alcohol abuse Paternal Uncle   . Alcohol abuse Paternal Uncle   . Alcohol abuse Paternal Uncle   . Rheum arthritis Mother   . Heart disease Neg Hx   . Stroke Neg Hx   . Cancer Neg Hx   . Drug abuse Neg Hx   . Depression Neg Hx   . Colon cancer Neg Hx   . Rectal cancer Neg Hx   . Stomach cancer Neg Hx   . Kidney cancer Neg Hx   . Bladder Cancer Neg Hx   . Prostate cancer Neg Hx     Social History:  reports that he has been smoking cigarettes. He has a 85.50 pack-year smoking history. He has quit using smokeless tobacco.  His smokeless tobacco use included chew. He reports previous alcohol use. He reports that he does not use drugs.  ROS: For pertinent review of systems please refer to history of present illness  Physical Exam: Blood pressure (!) 152/60, pulse 79, height 5\' 5"  (1.651 m), weight 166 lb (75.3 kg). Constitutional:  Well nourished. Alert and oriented, No acute distress. HEENT: [place] AT, mask in place.  Trachea midline Cardiovascular: No clubbing, cyanosis, or edema. Respiratory: Normal respiratory  effort, no increased work of breathing. Neurologic: Grossly intact, no focal deficits, moving all 4 extremities. Psychiatric: Normal mood and affect.    Laboratory Data: Lab Results  Component Value Date   PSA 1.94 08/08/2011   PSA 1.73 04/24/2011   PSA 1.74 11/19/2006  PSA 0.8 (1.6) ng/ml on 10/12/2015 PSA 0.7 (1.4) ng/mL on 10/06/2016 PSA 0.6 (1.2)  in 09/2017  Component     Latest Ref Rng & Units 04/20/2015 07/12/2016 09/24/2018 12/02/2019  PSA     0.10 - 4.00 ng/ml 0.88 0.57 0.43 0.24   I have reviewed the labs.  Pertinent Imaging: Results for AKEEN, LEDYARD (MRN [id number]) as of 01/20/2020 11:39  Ref. Range 01/20/2020 11:33  Scan Result Unknown 48    Assessment & Plan:   1. BPH with LUTS IPSS score is 13/3, it is slightly worse  Continue conservative management, avoiding bladder irritants and timed voiding's Continue tamsulosin 0.4 mg twice daily and finasteride 5 mg daily  RTC in 12 months for IPSS, PSA, PVR and exam   Return in about 1 year (around 01/19/2021) for IPSS, PVR, PSA and exam.  Zara Council, [facility]  [place] [address] [place],  [id number] [phone]

## 2020-01-20 ENCOUNTER — Other Ambulatory Visit: Payer: Self-pay

## 2020-01-20 ENCOUNTER — Ambulatory Visit (INDEPENDENT_AMBULATORY_CARE_PROVIDER_SITE_OTHER): Payer: Medicare Other | Admitting: Urology

## 2020-01-20 ENCOUNTER — Encounter: Payer: Self-pay | Admitting: Urology

## 2020-01-20 VITALS — BP 152/60 | HR 79 | Ht 65.0 in | Wt 166.0 lb

## 2020-01-20 DIAGNOSIS — N401 Enlarged prostate with lower urinary tract symptoms: Secondary | ICD-10-CM

## 2020-01-20 DIAGNOSIS — N138 Other obstructive and reflux uropathy: Secondary | ICD-10-CM

## 2020-01-20 LAB — BLADDER SCAN AMB NON-IMAGING: Scan Result: 48

## 2020-01-27 ENCOUNTER — Other Ambulatory Visit: Payer: Self-pay | Admitting: Primary Care

## 2020-01-27 DIAGNOSIS — K2100 Gastro-esophageal reflux disease with esophagitis, without bleeding: Secondary | ICD-10-CM

## 2020-02-03 DIAGNOSIS — E1142 Type 2 diabetes mellitus with diabetic polyneuropathy: Secondary | ICD-10-CM | POA: Diagnosis not present

## 2020-02-04 ENCOUNTER — Ambulatory Visit: Payer: Self-pay

## 2020-02-04 ENCOUNTER — Telehealth: Payer: Medicare Other

## 2020-02-04 NOTE — Chronic Care Management (AMB) (Signed)
Chronic Care Management Pharmacy  Name: Antonio [name]  MRN: [id number] DOB: 1945/09/06  Chief Complaint/ HPI  Antonio [name],  74 y.o. , male contacted for their follow up CCM visit with the clinical pharmacist via telephone.  PCP : Pleas Koch, NP  Contacted patient. Patient requested telephone visit today be rescheduled for 03/03/2020 at 10:30 AM.  Prior to visit, reviewed chart for medications changes/office visits or concerns. Noted pt has anemia, CBC low, colonoscopy 02/23/20 to review causes. Pt may need to come off aspirin since he is on Eliquis - will review at follow up.   Office Visits:  01/12/20: [facility] - fall, use walker, apply heat to right buttock, Tylenol PRN  12/19/19: PCP visit - rash on legs/feet of unclear cause, likely from new socks, rx for prednisone taper sent to pharmacy   12/02/19: PCP visit - DM controlled, HTN some orthostatic changes, hold amlodipine for 2 weeks; Tobacco use, currently prescribed Wellbutrin SR 150 mg BID for which was prescribed a few weeks ago. He has yet to start this treatment but is planning on doing so soon  09/30/18: PCP visit - ready to quit smoking, will try OTC products, referral placed for lung cancer screening, compliant to statin, LDL controlled, DM followed by endocrinology, stable on PPI for GERD, followed with urology annually for BPH  Consult Visit:  01/20/20: Urology - Avoid bladder irritants and continue timed voidings. Continue tamsulosin 0.4 mg twice daily and finasteride 5 mg daily. RTC in 12 months  07/21/19: Endocrinolgoy - diabetes remains controlled, A1c 7.4% with fasting BG in range. Check BG daily. F/u in 6 months  03/10/19: Eye exam  01/09/19: Urology - BPH, symptoms worsen with alcohol; IPSS score is 11/2, stable; Continue conservative management, avoiding bladder irritants and timed voiding; Continue tamsulosin 0.4 mg twice daily and finasteride 5 mg daily - meds refilled   10/22/18: Endoscopy - no abnormal  findings, GERD with dysphagia, continue current medications   10/03/18: Lung cancer screening; smoking 1 and 1/2 PPD, ready to quit wants to discuss with PCP;   Medications: Outpatient Encounter Medications as of 02/04/2020  Medication Sig   acetaminophen (TYLENOL) 500 MG tablet Take 1,000 mg by mouth in the morning and at bedtime.   amLODipine (NORVASC) 5 MG tablet Take by mouth.   aspirin 81 MG tablet Take 81 mg by mouth daily.   atorvastatin (LIPITOR) 20 MG tablet TAKE 1 TABLET BY MOUTH EVERY EVENING FOR CHOLESTEROL   buPROPion (WELLBUTRIN SR) 150 MG 12 hr tablet One tab daily for 5 days, twice a day starting on day 6.   ELIQUIS 5 MG TABS tablet TAKE 1 TABLET BY MOUTH TWICE A DAY   ferrous gluconate (FERGON) 324 MG tablet Take 1 tablet (324 mg total) by mouth daily with breakfast.   finasteride (PROSCAR) 5 MG tablet Take 1 tablet (5 mg total) by mouth daily.   glipiZIDE (GLUCOTROL) 10 MG tablet Take 10 mg by mouth daily before supper.    insulin detemir (LEVEMIR) 100 UNIT/ML injection Inject 20 units at bedtime. (Patient taking differently: every morning. Inject 15 units at breakfast)   lisinopril-hydrochlorothiazide (ZESTORETIC) 20-25 MG tablet TAKE 1 TABLET BY MOUTH EVERY DAY FOR BLOOD PRESSURE   metFORMIN (GLUCOPHAGE) 500 MG tablet TAKE 2 TABLETS BY MOUTH TWICE A DAY WITH MEAL FOR DIABETES   metoprolol tartrate (LOPRESSOR) 50 MG tablet TAKE 1/2 TABLET BY MOUTH TWICE A DAY   omeprazole (PRILOSEC) 20 MG capsule Take 1 capsule (20 mg  total) by mouth 2 (two) times daily before a meal for 10 days.   OVER THE COUNTER MEDICATION Glucosomine one tablet daily.   tamsulosin (FLOMAX) 0.4 MG CAPS capsule Take 2 capsules by mouth once daily   [DISCONTINUED] pantoprazole (PROTONIX) 40 MG tablet Take 1 tablet (40 mg total) by mouth daily. For heartburn.   No facility-administered encounter medications on file as of 02/04/2020.    [name], PharmD Clinical Pharmacist [place]  [facility] [phone]

## 2020-02-04 NOTE — Progress Notes (Signed)
I have personally reviewed this encounter including the documentation in this note and have collaborated with the care management provider regarding care management and care coordination activities to include development and update of the comprehensive care plan. I am certifying that I agree with the content of this note and encounter as supervising physician.  Webb Silversmith, NP

## 2020-02-04 NOTE — Chronic Care Management (AMB) (Unsigned)
Chronic Care Management Pharmacy  Name: Antonio [name]  MRN: [id number] DOB: [birth year]  Chief Complaint/ HPI  Antonio [name],  74 y.o. , male presents for their Initial CCM visit with the clinical pharmacist via telephone.  PCP : Pleas Koch, NP  Their chronic conditions include: HTN, HLD, type 2 diabetes, PAD s/p angioplasty and stents, tobacco dependence, GERD, BPH  Patient concerns: denies medication concerns  Office Visits:  01/12/20: Tower - fall, use walker, apply heat to right buttock, Tylenol PRN  12/19/19: PCP visit - rash on legs/feet of unclear cause, likely from new socks, rx for prednisone taper sent to pharmacy   12/02/19: PCP visit - DM controlled, HTN some orthostatic changes, hold amlodipine for 2 weeks; Tobacco use, currently prescribed Wellbutrin SR 150 mg BID for which was prescribed a few weeks ago. He has yet to start this treatment but is planning on doing so soon  09/30/18: PCP visit - ready to quit smoking, will try OTC products, referral placed for lung cancer screening, compliant to statin, LDL controlled, DM followed by endocrinology, stable on PPI for GERD, followed with urology annually for BPH  Consult Visit:  01/20/20: Urology - Avoid bladder irritants and continue timed voidings. Continue tamsulosin 0.4 mg twice daily and finasteride 5 mg daily. RTC in 12 months  07/21/19: Endocrinolgoy - diabetes remains controlled, A1c 7.4% with fasting BG in range. Check BG daily. F/u in 6 months  03/10/19: Eye exam  01/09/19: Urology - BPH, symptoms worsen with alcohol; IPSS score is 11/2, stable; Continue conservative management, avoiding bladder irritants and timed voiding; Continue tamsulosin 0.4 mg twice daily and finasteride 5 mg daily - meds refilled   10/22/18: Endoscopy - no abnormal findings, GERD with dysphagia, continue current medications   10/03/18: Lung cancer screening; smoking 1 and 1/2 PPD, ready to quit wants to discuss with PCP;    Medications: Outpatient Encounter Medications as of 02/04/2020  Medication Sig  . acetaminophen (TYLENOL) 500 MG tablet Take 1,000 mg by mouth in the morning and at bedtime.  [name] amLODipine (NORVASC) 5 MG tablet Take by mouth.  [name] aspirin 81 MG tablet Take 81 mg by mouth daily.  [name] atorvastatin (LIPITOR) 20 MG tablet TAKE 1 TABLET BY MOUTH EVERY EVENING FOR CHOLESTEROL  . buPROPion (WELLBUTRIN SR) 150 MG 12 hr tablet One tab daily for 5 days, twice a day starting on day 6.  . ELIQUIS 5 MG TABS tablet TAKE 1 TABLET BY MOUTH TWICE A DAY  . ferrous gluconate (FERGON) 324 MG tablet Take 1 tablet (324 mg total) by mouth daily with breakfast.  . finasteride (PROSCAR) 5 MG tablet Take 1 tablet (5 mg total) by mouth daily.  [name] glipiZIDE (GLUCOTROL) 10 MG tablet Take 10 mg by mouth daily before supper.   . insulin detemir (LEVEMIR) 100 UNIT/ML injection Inject 20 units at bedtime. (Patient taking differently: every morning. Inject 15 units at breakfast)  . lisinopril-hydrochlorothiazide (ZESTORETIC) 20-25 MG tablet TAKE 1 TABLET BY MOUTH EVERY DAY FOR BLOOD PRESSURE  . metFORMIN (GLUCOPHAGE) 500 MG tablet TAKE 2 TABLETS BY MOUTH TWICE A DAY WITH MEAL FOR DIABETES  . metoprolol tartrate (LOPRESSOR) 50 MG tablet TAKE 1/2 TABLET BY MOUTH TWICE A DAY  . omeprazole (PRILOSEC) 20 MG capsule Take 1 capsule (20 mg total) by mouth 2 (two) times daily before a meal for 10 days.  [name] OVER THE COUNTER MEDICATION Glucosomine one tablet daily.  . tamsulosin (FLOMAX) 0.4 MG CAPS capsule Take 2  capsules by mouth once daily  . [DISCONTINUED] pantoprazole (PROTONIX) 40 MG tablet Take 1 tablet (40 mg total) by mouth daily. For heartburn.   No facility-administered encounter medications on file as of 02/04/2020.   Current Diagnosis/Assessment:  SDOH: No medication cost concerns   Goals Addressed   None    Hypertension   CMP Latest Ref Rng & Units 12/02/2019 09/24/2018 05/31/2018  Glucose 70 - 99 mg/dL 74 154(H)  195(H)  BUN 6 - 23 mg/dL 26(H) 30(H) 17  Creatinine 0.40 - 1.50 mg/dL 1.19 1.13 0.91  Sodium 135 - 145 mEq/L 142 139 136  Potassium 3.5 - 5.1 mEq/L 5.2(H) 4.3 3.9  Chloride 96 - 112 mEq/L 113(H) 104 103  CO2 19 - 32 mEq/L 21 25 26   Calcium 8.4 - 10.5 mg/dL 8.9 9.5 8.4(L)  Total Protein 6.0 - 8.3 g/dL 6.2 6.6 -  Total Bilirubin 0.2 - 1.2 mg/dL 0.3 0.4 -  Alkaline Phos 39 - 117 U/L 42 52 -  AST 0 - 37 U/L 11 9 -  ALT 0 - 53 U/L 9 9 -   Office blood pressures are  BP Readings from Last 3 Encounters:  01/20/20 (!) 152/60  01/12/20 122/80  12/19/19 124/62   Patient has tried these meds in the past: ramipril Patient is currently controlled on the following medications:  . Lisinopril/HCTZ 20-25 mg - 1 tablet daily . Metoprolol tartrate 50 mg - 1/2 tablet twice daily . Amlodipine 5 mg - 1 tablet daily (on HOLD)  Patient checks BP at home: none, does not have monitor Patient home BP readings are ranging: none reported Adherence: < 5 day gap between refills  We discussed: BP was checked recently at Dr. Joycie Peek office (07/21/19 - 104/56 mmHg) and within goal  Plan: Continue current medications   Hyperlipidemia   Lipid Panel     Component Value Date/Time   CHOL 102 12/02/2019 0853   TRIG 131.0 12/02/2019 0853   HDL 38.50 (L) 12/02/2019 0853   CHOLHDL 3 12/02/2019 0853   VLDL 26.2 12/02/2019 0853   LDLCALC 37 12/02/2019 0853   LDLDIRECT 120.3 04/24/2011 1514    LDL goal < 70 (PAD, CAD) Patient has failed these meds in past: none Patient is currently controlled on the following medications:  . Atorvastatin 20 mg - 1 tablet in the evening  Adherence: > 5 day gap between refills   Plan: Continue current medications; Encourage pillbox for daily reminder to take medications.  Diabetes   Recent Relevant Labs:  Most recent A1c 7.4% per endo note Pt reports A1c was < 7% when checked several weeks ago at endocrinology Lab Results  Component Value Date/Time   HGBA1C 6.8 (A)  12/02/2019 08:32 AM   HGBA1C 7.6 (H) 09/24/2018 08:19 AM   HGBA1C 6.8 (A) 09/14/2017 08:30 AM   HGBA1C 7.6 03/16/2017 12:00 AM   HGBA1C 8.9 (H) 07/12/2016 09:43 AM   GFR 59.99 (L) 12/02/2019 08:53 AM   GFR 63.48 09/24/2018 08:19 AM   MICROALBUR 6.8 (H) 12/23/2014 08:08 AM   MICROALBUR 1.3 11/12/2007 09:24 AM    Checking BG: Daily 3x/week Recent FBG Readings: 100 or lower   Patient has failed these meds in past: pioglitazone Patient is currently controlled on the following medications:  [name] Glipizide 10 mg - 1 tablet daily before supper . Levemir 100 unit/ml - Inject 15 units at breakfast (cut back to 15 units 1 year ago) . Metformin 500 mg - 2 tablets twice daily with a meal  Last diabetic Eye exam:  Lab Results  Component Value Date/Time   HMDIABEYEEXA No Retinopathy 08/31/2017 10:06 AM    Last diabetic Foot exam: Checks at every visit with Dr. Gabriel Carina, has some numbness in feet, no pain   Adherence: med list states 20 units Levemir, pt reports only taking 15 units for past year; metformin and glipizide refills timely   Plan: Continue current medications; Due for eye exam. Update Levemir dose on med list.  GERD   Patient has failed these meds in past: esomeprazole Patient is currently controlled on the following medications:  . Pantoprazole 40 mg 1 tablet daily  We discussed: drinks a lot of water to help with difficulty swallowing; denies chest pain or acid reflux  Plan: Continue current medications  BPH with Obstruction/LUTS   Patient has failed these meds in past: none Patient is currently controlled on the following medications:  . Finasteride 5 mg - 1 tablet daily . Tamsulosin 0.4 mg - 2 capsules daily  We discussed: reports working well, managed by urology  Plan: Continue current medications   PAD s/p angioplasty and stents   CBC Latest Ref Rng & Units 12/08/2019 12/02/2019 09/24/2018  WBC 4.0 - 10.5 K/uL 9.2 9.3 10.4  Hemoglobin 13.0 - 17.0 g/dL 8.5 Repeated  and verified X2.(L) 8.3 Repeated and verified X2.(L) 13.5  Hematocrit 39.0 - 52.0 % 26.1 Repeated and verified X2.(L) 26.2 Repeated and verified X2.(L) 40.3  Platelets 150.0 - 400.0 K/uL 292.0 281.0 254.0   Patient has tried these meds in past: clopidogrel Patient is currently on the following medications:  . Aspirin 81 mg - 1 tablet daily . Eliquis 5 mg - 1 tablet twice daily  Recent history: Started Eliquis April 2020 for PAD following lower extremity angiography, unclear intended duration, last saw vascular surgery 06/25/18 with plan for 6 month follow up, pt has not been seen since then  We discussed: Encouraged tobacco cessation and follow up with Dr. Lucky Cowboy   Plan: Continue current medications; Recommend follow up with vein and vascular.    OTCs/Misc.   Patient is currently on the following medications:  Glucosamine - 1 tablet daily twice daily (taking for hip and joint pain)  We discussed:  Denies additional OTCs including NSAIDs  Plan: Continue current medications   Vaccines   Reviewed and discussed patient's vaccination history. Declines flu vaccine. Reports second dose of COVID vaccine this week.  Immunization History  Administered Date(s) Administered  . Moderna Sars-Covid-2 Vaccination 07/09/2019, 08/06/2019  . Pneumococcal Conjugate-13 09/30/2018  . Td 10/08/1994, 11/19/2006   Plan: Recommended patient receive Shingrix.   Medication Management   Pharmacy/Benefits: [facility]  Adherence: > 5 day gap between refills on atorvastatin  We discussed: [facility] by insurance   Plan Continue current medication management strategy. Recommend using a pillbox to help improve adherence.   Follow up: 6 month phone visit   [name], PharmD Clinical Pharmacist [facility] [phone]

## 2020-02-05 DIAGNOSIS — I10 Essential (primary) hypertension: Secondary | ICD-10-CM | POA: Diagnosis not present

## 2020-02-05 DIAGNOSIS — E1129 Type 2 diabetes mellitus with other diabetic kidney complication: Secondary | ICD-10-CM | POA: Diagnosis not present

## 2020-02-05 DIAGNOSIS — F341 Dysthymic disorder: Secondary | ICD-10-CM | POA: Diagnosis not present

## 2020-02-05 DIAGNOSIS — R809 Proteinuria, unspecified: Secondary | ICD-10-CM | POA: Diagnosis not present

## 2020-02-05 DIAGNOSIS — E1159 Type 2 diabetes mellitus with other circulatory complications: Secondary | ICD-10-CM | POA: Diagnosis not present

## 2020-02-05 DIAGNOSIS — F17200 Nicotine dependence, unspecified, uncomplicated: Secondary | ICD-10-CM | POA: Diagnosis not present

## 2020-02-05 DIAGNOSIS — Z794 Long term (current) use of insulin: Secondary | ICD-10-CM | POA: Diagnosis not present

## 2020-02-05 DIAGNOSIS — E1142 Type 2 diabetes mellitus with diabetic polyneuropathy: Secondary | ICD-10-CM | POA: Diagnosis not present

## 2020-02-19 ENCOUNTER — Other Ambulatory Visit (HOSPITAL_COMMUNITY)
Admission: RE | Admit: 2020-02-19 | Discharge: 2020-02-19 | Disposition: A | Discharge status: Home / Self Care | Payer: Medicare Other | Source: Ambulatory Visit | Attending: Gastroenterology | Admitting: Gastroenterology

## 2020-02-19 DIAGNOSIS — Z20822 Contact with and (suspected) exposure to covid-19: Secondary | ICD-10-CM | POA: Insufficient documentation

## 2020-02-19 DIAGNOSIS — Z01812 Encounter for preprocedural laboratory examination: Secondary | ICD-10-CM | POA: Diagnosis not present

## 2020-02-19 LAB — SARS CORONAVIRUS 2 (TAT 6-24 HRS): SARS Coronavirus 2: NEGATIVE

## 2020-03-03 ENCOUNTER — Other Ambulatory Visit: Payer: Self-pay

## 2020-03-03 ENCOUNTER — Ambulatory Visit: Payer: Medicare Other

## 2020-03-03 DIAGNOSIS — Z794 Long term (current) use of insulin: Secondary | ICD-10-CM

## 2020-03-03 DIAGNOSIS — I739 Peripheral vascular disease, unspecified: Secondary | ICD-10-CM

## 2020-03-03 DIAGNOSIS — E7800 Pure hypercholesterolemia, unspecified: Secondary | ICD-10-CM

## 2020-03-03 DIAGNOSIS — I10 Essential (primary) hypertension: Secondary | ICD-10-CM

## 2020-03-03 DIAGNOSIS — E11618 Type 2 diabetes mellitus with other diabetic arthropathy: Secondary | ICD-10-CM

## 2020-03-03 NOTE — Telephone Encounter (Signed)
Mendel Ryder called from pain care for the patient and after speaking to the patient's pharmacist noticed patient was supposed to start taking iron daily. Informed Mendel Ryder he is supposed to have started iron on 12/11/19 after his procedures. Mendel Ryder verbalized understanding and will contact patient and his pharmacy.

## 2020-03-03 NOTE — Chronic Care Management (AMB) (Signed)
Chronic Care Management Pharmacy  Name: Antonio [name]  MRN: [id number] DOB: 1945-12-06  Chief Complaint/ HPI  Antonio [name],  75 y.o. , male contacted for their follow up CCM visit with the clinical pharmacist via telephone.  PCP : Pleas Koch, NP  Patient concerns: Reports biggest health concern is arthritis in hip and legs, reports weakness in legs, falling a time or two. Reports he needs to follow up with Dr. Lucky Cowboy, vascular surgery. He has not scheduled yet due to busyness. He has a lesion in stomach scheduled for surgery in March. He reports on Fibercon and B12, denies taking iron. --> Patient not taking iron (ferrous gluconate). He does not recall starting this. Will check with Mansouraty, Telford Nab., MD to see if patient should resume. He may have completed 30 day supply and not refilled, he is unsure. Reports [facility] and kept these meds separately but did not refill.  Office Visits:  01/12/20: [place] - fall, use walker, apply heat to right buttock, Tylenol PRN  12/19/19: PCP visit - rash on legs/feet of unclear cause, likely from new socks, rx for prednisone taper sent to pharmacy   12/02/19: PCP visit - DM controlled, HTN some orthostatic changes, hold amlodipine for 2 weeks; Tobacco use, currently prescribed Wellbutrin SR 150 mg BID for which was prescribed a few weeks ago. He has yet to start this treatment but is planning on doing so soon  09/30/18: PCP visit - ready to quit smoking, will try OTC products, referral placed for lung cancer screening, compliant to statin, LDL controlled, DM followed by endocrinology, stable on PPI for GERD, followed with urology annually for BPH  Consult Visit:  02/05/20: Endocrinology - Does not check BG, no A1c (lab error), unable to assess today, rtc 3 months, smoking 1 PPD, BP uncontrolled, does not monitor BP outside of clinic, HLD controlled on atorvastatin. Order home BP cuff, let me know if SBP > 130  consistently. Encouraged daily BG monitoring.  01/20/20: Urology - Avoid bladder irritants and continue timed voidings. Continue tamsulosin 0.4 mg twice daily and finasteride 5 mg daily. RTC in 12 months  12/11/19: Colonoscopy - start iron  07/21/19: Endocrinolgoy - diabetes remains controlled, A1c 7.4% with fasting BG in range. Check BG daily. F/u in 6 months  03/10/19: Eye exam  01/09/19: Urology - BPH, symptoms worsen with alcohol; IPSS score is 11/2, stable; Continue conservative management, avoiding bladder irritants and timed voiding; Continue tamsulosin 0.4 mg twice daily and finasteride 5 mg daily - meds refilled   10/22/18: Endoscopy - no abnormal findings, GERD with dysphagia, continue current medications   10/03/18: Lung cancer screening; smoking 1 and 1/2 PPD, ready to quit wants to discuss with PCP;   Medications: Outpatient Encounter Medications as of 03/03/2020  Medication Sig Note  . acetaminophen (TYLENOL) 500 MG tablet Take 1,000 mg by mouth every 6 (six) hours. Rapid release   . amLODipine (NORVASC) 5 MG tablet Take 5 mg by mouth daily.   [name] aspirin 81 MG tablet Take 81 mg by mouth daily.   [name] atorvastatin (LIPITOR) 20 MG tablet TAKE 1 TABLET BY MOUTH EVERY EVENING FOR CHOLESTEROL   . ELIQUIS 5 MG TABS tablet TAKE 1 TABLET BY MOUTH TWICE A DAY   . finasteride (PROSCAR) 5 MG tablet Take 1 tablet (5 mg total) by mouth daily.   [name] glipiZIDE (GLUCOTROL) 10 MG tablet Take 10 mg by mouth daily before supper.    [name]  GLUCOSAMINE HCL PO Take 1 tablet by mouth daily.   . insulin detemir (LEVEMIR) 100 UNIT/ML injection Inject 20 units at bedtime. (Patient taking differently: Inject 15 Units into the skin daily.)   . metFORMIN (GLUCOPHAGE) 500 MG tablet TAKE 2 TABLETS BY MOUTH TWICE A DAY WITH MEAL FOR DIABETES   . metoprolol tartrate (LOPRESSOR) 50 MG tablet TAKE 1/2 TABLET BY MOUTH TWICE A DAY   . pantoprazole (PROTONIX) 40 MG tablet Take 40 mg by mouth daily.   . tamsulosin (FLOMAX) 0.4  MG CAPS capsule Take 2 capsules by mouth once daily (Patient taking differently: Take 0.4 mg by mouth 2 (two) times daily.)   . triamcinolone cream (KENALOG) 0.5 % Apply 1 application topically 2 (two) times daily.   . vitamin B-12 (CYANOCOBALAMIN) 1000 MCG tablet Take 1,000 mcg by mouth daily.   [name] buPROPion (WELLBUTRIN SR) 150 MG 12 hr tablet One tab daily for 5 days, twice a day starting on day 6. (Patient not taking: Reported on 03/03/2020) 02/16/2020: Have not started  . ferrous gluconate (FERGON) 324 MG tablet Take 1 tablet (324 mg total) by mouth daily with breakfast. (Patient not taking: No sig reported)   . lisinopril-hydrochlorothiazide (ZESTORETIC) 20-25 MG tablet TAKE 1 TABLET BY MOUTH EVERY DAY FOR BLOOD PRESSURE (Patient not taking: Reported on 03/03/2020)   . [DISCONTINUED] omeprazole (PRILOSEC) 20 MG capsule Take 1 capsule (20 mg total) by mouth 2 (two) times daily before a meal for 10 days. (Patient not taking: No sig reported)    No facility-administered encounter medications on file as of 03/03/2020.   Current Diagnosis/Assessment:  SDOH: No medication cost concerns   Goals Addressed            This Visit's Progress   . Pharmacy Care Plan       CARE PLAN ENTRY (see longitudinal plan of care for additional care plan information)  Current Barriers:  . Chronic Disease Management support, education, and care coordination needs related to Hypertension, Hyperlipidemia, Diabetes, and Peripheral Artery Disease   Hypertension BP Readings from Last 3 Encounters:  01/20/20 (!) 152/60  01/12/20 122/80  12/19/19 124/62 .  Pharmacist Clinical Goal(s): o Over the next 6 months, patient will work with PharmD and providers to maintain BP goal <140/90 mmHg . Current regimen:  . Lisinopril/HCTZ 20/25 mg - 1 tablet daily (not taking) . Metoprolol tartrate 50 mg - 1/2 tablet twice daily . Amlodipine 5 mg - 1 tablet daily . Interventions: o Recommend checking home BP for the next 2  weeks . Patient self care activities - Over the next 6 months, patient will: o Record home BP over the next 2 weeks o Limit salt intake   Hyperlipidemia Lab Results  Component Value Date/Time   LDLCALC 37 12/02/2019 08:53 AM   LDLDIRECT 120.3 04/24/2011 03:14 PM .  Pharmacist Clinical Goal(s): o Over the next 6 months, patient will work with PharmD and providers to maintain LDL goal < 70 . Current regimen:  o Atorvastatin 20 mg - 1 tablet daily  . Interventions: o Reviewed adherence, patient denies frequent missed doses  . Patient self care activities - Over the next 6 month, patient will: o Continue using a pillbox for morning and evening medications  Diabetes  Lab Results  Component Value Date/Time   HGBA1C 6.8 (A) 12/02/2019 08:32 AM   HGBA1C 7.6 (H) 09/24/2018 08:19 AM   HGBA1C 6.8 (A) 09/14/2017 08:30 AM   HGBA1C 7.6 03/16/2017 12:00 AM   HGBA1C 8.9 (  H) 07/12/2016 09:43 AM .  Pharmacist Clinical Goal(s): o Over the next 6 months, patient will work with PharmD and providers to maintain A1c goal <7% . Current regimen:  [name] Glipizide 10 mg - 1 tablet daily before supper . Levemir 100 unit/ml - Inject 15 units at breakfast . Metformin 500 mg - 2 tablets twice daily with a meal . Interventions: o Recommend annual comprehensive eye exam  o Recommend continuing to monitor BG daily and keep log o Reviewed home BG readings - fasting 80-110, within goal  . Patient self care activities - Over the next 6 months, patient will: o Check blood sugar in the morning before eating or drinking, document, and provide at future appointments o Contact provider with any episodes of hypoglycemia o Schedule eye exam   Peripheral Artery Disease . Pharmacist Clinical Goal(s) o Over the next 6 months, patient will work with PharmD and providers to ensure safe medication use . Current regimen:  . Aspirin 81 mg - 1 tablet daily . Eliquis 5 mg - 1 tablet twice daily . Interventions: o Recommend  following up with Dr. Leotis Pain o Reviewed labs  . Patient self care activities - Over the next 6 months, patient will: o Schedule follow up visit with Dr. Lucky Cowboy  Vaccines: Recommend shingles vaccines (Shingrix) from local pharmacy. [name] will let you know if you need to resume iron daily.  Please see past updates related to this goal by clicking on the "Past Updates" button in the selected goal       Hypertension   CMP Latest Ref Rng & Units 12/02/2019 09/24/2018 05/31/2018  Glucose 70 - 99 mg/dL 74 154(H) 195(H)  BUN 6 - 23 mg/dL 26(H) 30(H) 17  Creatinine 0.40 - 1.50 mg/dL 1.19 1.13 0.91  Sodium 135 - 145 mEq/L 142 139 136  Potassium 3.5 - 5.1 mEq/L 5.2(H) 4.3 3.9  Chloride 96 - 112 mEq/L 113(H) 104 103  CO2 19 - 32 mEq/L 21 25 26   Calcium 8.4 - 10.5 mg/dL 8.9 9.5 8.4(L)  Total Protein 6.0 - 8.3 g/dL 6.2 6.6 -  Total Bilirubin 0.2 - 1.2 mg/dL 0.3 0.4 -  Alkaline Phos 39 - 117 U/L 42 52 -  AST 0 - 37 U/L 11 9 -  ALT 0 - 53 U/L 9 9 -   Office blood pressures are  BP Readings from Last 3 Encounters:  01/20/20 (!) 152/60  01/12/20 122/80  12/19/19 124/62   Patient has tried these meds in the past: ramipril Patient is currently uncontrolled on the following medications:  . Lisinopril/HCTZ 20-25 mg - 1 tablet daily (no refill history) . Metoprolol tartrate 50 mg - 1/2 tablet twice daily . Amlodipine 5 mg - 1 tablet daily   Update 03/03/20: Pt reports he has not picked up home BP monitor as recommended by Dr. Gabriel Carina. Reports BP has always run high. No longer taking lisinopril/HCTZ - reports it was held due to low BP by Anda Kraft.  Discussed picking up BP cuff and begin monitoring daily for next 2 weeks. If high we may resume lisinopril/HCTZ at that time. Check in about 2 weeks for BP log review.   Plan: Continue current medications Check in about 2 weeks for BP log review.   Hyperlipidemia   Lipid Panel     Component Value Date/Time   CHOL 102 12/02/2019 0853   TRIG 131.0  12/02/2019 0853   HDL 38.50 (L) 12/02/2019 0853   CHOLHDL 3 12/02/2019 0853   VLDL  26.2 12/02/2019 0853   LDLCALC 37 12/02/2019 0853   LDLDIRECT 120.3 04/24/2011 1514    LDL goal < 70 (PAD, CAD) Patient has failed these meds in past: none Patient is currently controlled on the following medications:  . Atorvastatin 20 mg - 1 tablet in the evening  Update 03/03/20: Using pillbox now, reports very seldom missed dose in the evenings. LDL at goal.   Plan: Continue current medications  Diabetes   Recent Relevant Labs:  Lab Results  Component Value Date/Time   HGBA1C 6.8 (A) 12/02/2019 08:32 AM   HGBA1C 7.6 (H) 09/24/2018 08:19 AM   HGBA1C 6.8 (A) 09/14/2017 08:30 AM   HGBA1C 7.6 03/16/2017 12:00 AM   HGBA1C 8.9 (H) 07/12/2016 09:43 AM   GFR 59.99 (L) 12/02/2019 08:53 AM   GFR 63.48 09/24/2018 08:19 AM   MICROALBUR 6.8 (H) 12/23/2014 08:08 AM   MICROALBUR 1.3 11/12/2007 09:24 AM    Patient has failed these meds in past: pioglitazone Patient is currently controlled on the following medications:  [name] Glipizide 10 mg - 1 tablet daily before supper . Levemir 100 unit/ml - Inject 15 units at breakfast  . Metformin 500 mg - 2 tablets twice daily with a meal  Last diabetic eye exam: patient reports he plans to schedule soon 03/03/20  Lab Results  Component Value Date/Time   HMDIABEYEEXA No Retinopathy 08/31/2017 10:06 AM    Last diabetic foot exam: Checks at every visit with Dr. Gabriel Carina, has some numbness in feet, no pain   Update 03/03/20: Pt reports checking BG every morning around 6:30 AM (fasting). Numbers are ranging mid-80s to 110. Denies any less than 70. Pt reports next appt with Dr. Gabriel Carina is scheduled for 3 months from previous visit. Last visit A1c was not done.   Plan: Continue current medications; Continue monitoring BG daily and keeping log for Dr. Gabriel Carina.  Osteoarthritis   Patient has failed these meds in past: none reported Patient is currently uncontrolled on the  following medications:   Tylenol 500 mg - 2 tablets TID  Update 03/03/20:  Pt reports pain in legs and hip is very bothersome. He plans to follow up with vascular surgery to discuss options. He denies benefit from other topical or OTC medications. Avoid NSAIDs due to Eliquis/aspirin.  Plan: Continue current medications; Avoid NSAIDs due to Eliquis/aspirin. Follow up with Dr. Lucky Cowboy  Smoking Cessation   Patient has failed these meds in past: none  Patient is currently uncontrolled on the following medications:   No therapy - has not started Bupropion  Update 03/03/20: He reports not starting bupropion, not ready to quit at this time. Busy and concerned about other things. Smoking 1 PPD.  Plan: Patient to call if interested in tobacco cessation support.  GERD   Patient has failed these meds in past: esomeprazole, omeprazole  Patient is currently controlled on the following medications:  . Pantoprazole 40 mg 1 tablet daily  Update 03/03/20: D/c omeprazole, completed course. Taking Pantoprazole daily. Reports symptom control. Asked patient to check to see if he has it since refill history reports past due. He reports [facility] with [facility].   Plan: Continue current medications  BPH with Obstruction/LUTS   Patient has failed these meds in past: none Patient is currently controlled on the following medications:  . Finasteride 5 mg - 1 tablet daily . Tamsulosin 0.4 mg - 2 capsules daily  Update 03/03/20: reports working well, managed by urology  Plan: Continue current medications  PAD s/p angioplasty and stents   CBC Latest Ref Rng & Units 12/08/2019 12/02/2019 09/24/2018  WBC 4.0 - 10.5 K/uL 9.2 9.3 10.4  Hemoglobin 13.0 - 17.0 g/dL 8.5 Repeated and verified X2.(L) 8.3 Repeated and verified X2.(L) 13.5  Hematocrit 39.0 - 52.0 % 26.1 Repeated and verified X2.(L) 26.2 Repeated and verified X2.(L) 40.3  Platelets 150.0 - 400.0 K/uL 292.0 281.0 254.0   Patient has  tried these meds in past: clopidogrel Patient is currently on the following medications:  . Aspirin 81 mg - 1 tablet daily . Eliquis 5 mg - 1 tablet twice daily  Recent history: Started Eliquis April 2020 for PAD following lower extremity angiography, unclear intended duration, last saw vascular surgery 06/25/18 with plan for 6 month follow up, pt has not been seen since then  Update 03/03/20: Again encouraged tobacco cessation and follow up with Dr. Lucky Cowboy   Plan: Continue current medications; Recommend follow up with vein and vascular.    OTCs/Misc.   Patient is currently on the following medications:  Glucosamine - 1 tablet daily twice daily (taking for hip and joint pain)  Vitamin B12 1000 mcg daily  Update 03/03/20: Denies additional OTCs including NSAIDs  Plan: Continue current medications   Medication Management   Pharm[facility]Benefits[facility]  Adherence: > 5 day gap between refills on atorvastatin, last filled 01/27/20 90 DS. Pt reports taking as prescribed with very seldom missed dose. Using pillbox now. Not taking lisinopril/HCTZ, unclear if it was discontinued. Pt reports he was instructed to d/c. Will review BP and consider resuming.   We discussed[facility]   Plan Continue current medication management strategy   Follow up: 6 month phone visit; CMA call for BP review in 2 weeks  [name], PharmD Clinical Pharmacist [facility] at [facility] [phone]

## 2020-03-03 NOTE — Patient Instructions (Signed)
Dear Antonio [name],  Below is a summary of the goals we discussed during our follow up appointment on March 03, 2020. Please contact me anytime with questions or concerns.   Visit Information  Goals Addressed            This Visit's Progress   . Pharmacy Care Plan       CARE PLAN ENTRY (see longitudinal plan of care for additional care plan information)  Current Barriers:  . Chronic Disease Management support, education, and care coordination needs related to Hypertension, Hyperlipidemia, Diabetes, and Peripheral Artery Disease   Hypertension BP Readings from Last 3 Encounters:  01/20/20 (!) 152/60  01/12/20 122/80  12/19/19 124/62 .  Pharmacist Clinical Goal(s): o Over the next 6 months, patient will work with PharmD and providers to maintain BP goal <140/90 mmHg . Current regimen:  . Lisinopril/HCTZ 20/25 mg - 1 tablet daily (not taking) . Metoprolol tartrate 50 mg - 1/2 tablet twice daily . Amlodipine 5 mg - 1 tablet daily . Interventions: o Recommend checking home BP for the next 2 weeks . Patient self care activities - Over the next 6 months, patient will: o Record home BP over the next 2 weeks o Limit salt intake   Hyperlipidemia Lab Results  Component Value Date/Time   LDLCALC 37 12/02/2019 08:53 AM   LDLDIRECT 120.3 04/24/2011 03:14 PM .  Pharmacist Clinical Goal(s): o Over the next 6 months, patient will work with PharmD and providers to maintain LDL goal < 70 . Current regimen:  o Atorvastatin 20 mg - 1 tablet daily  . Interventions: o Reviewed adherence, patient denies frequent missed doses  . Patient self care activities - Over the next 6 month, patient will: o Continue using a pillbox for morning and evening medications  Diabetes  Lab Results  Component Value Date/Time   HGBA1C 6.8 (A) 12/02/2019 08:32 AM   HGBA1C 7.6 (H) 09/24/2018 08:19 AM   HGBA1C 6.8 (A) 09/14/2017 08:30 AM   HGBA1C 7.6 03/16/2017 12:00 AM   HGBA1C 8.9 (H) 07/12/2016 09:43  AM .  Pharmacist Clinical Goal(s): o Over the next 6 months, patient will work with PharmD and providers to maintain A1c goal <7% . Current regimen:  [name] Glipizide 10 mg - 1 tablet daily before supper . Levemir 100 unit/ml - Inject 15 units at breakfast . Metformin 500 mg - 2 tablets twice daily with a meal . Interventions: o Recommend annual comprehensive eye exam  o Recommend continuing to monitor BG daily and keep log o Reviewed home BG readings - fasting 80-110, within goal  . Patient self care activities - Over the next 6 months, patient will: o Check blood sugar in the morning before eating or drinking, document, and provide at future appointments o Contact provider with any episodes of hypoglycemia o Schedule eye exam   Peripheral Artery Disease . Pharmacist Clinical Goal(s) o Over the next 6 months, patient will work with PharmD and providers to ensure safe medication use . Current regimen:  . Aspirin 81 mg - 1 tablet daily . Eliquis 5 mg - 1 tablet twice daily . Interventions: o Recommend following up with Dr. Leotis Pain o Reviewed labs  . Patient self care activities - Over the next 6 months, patient will: o Schedule follow up visit with Dr. Lucky Cowboy  Vaccines: Recommend shingles vaccines (Shingrix) from local pharmacy. [name] will let you know if you need to resume iron daily.  Please see past updates related to this goal  by clicking on the "Past Updates" button in the selected goal        Patient verbalizes understanding of instructions provided today and agrees to view in [place].   The pharmacy team will reach out to the patient again over the next 14 days.   [name], PharmD Clinical Pharmacist [facility] at [facility] [phone]   Blood Pressure Record Sheet To take your blood pressure, you will need a blood pressure machine. You can buy a blood pressure machine (blood pressure monitor) at your clinic, drug store, or online. When choosing  one, consider:  An automatic monitor that has an arm cuff.  A cuff that wraps snugly around your upper arm. You should be able to fit only one finger between your arm and the cuff.  A device that stores blood pressure reading results.  Do not choose a monitor that measures your blood pressure from your wrist or finger. Follow your health care provider's instructions for how to take your blood pressure. To use this form:  Get one reading in the morning (a.m.) before you take any medicines.  Get one reading in the evening (p.m.) before supper.  Take at least 2 readings with each blood pressure check. This makes sure the results are correct. Wait 1-2 minutes between measurements.  Write down the results in the spaces on this form.  Repeat this once a week, or as told by your health care provider.  Make a follow-up appointment with your health care provider to discuss the results. Blood pressure log Date: _______________________  a.m. _____________________(1st reading) _____________________(2nd reading)  p.m. _____________________(1st reading) _____________________(2nd reading) Date: _______________________  a.m. _____________________(1st reading) _____________________(2nd reading)  p.m. _____________________(1st reading) _____________________(2nd reading) Date: _______________________  a.m. _____________________(1st reading) _____________________(2nd reading)  p.m. _____________________(1st reading) _____________________(2nd reading) Date: _______________________  a.m. _____________________(1st reading) _____________________(2nd reading)  p.m. _____________________(1st reading) _____________________(2nd reading) Date: _______________________  a.m. _____________________(1st reading) _____________________(2nd reading)  p.m. _____________________(1st reading) _____________________(2nd reading) This information is not intended to replace advice given to you by your health  care provider. Make sure you discuss any questions you have with your health care provider. Document Revised: 05/14/2019 Document Reviewed: 05/14/2019 Elsevier Patient Education  2021 [employer].

## 2020-03-03 NOTE — Telephone Encounter (Signed)
Linsey called asking if the patient should be on iron please call [phone]

## 2020-03-17 ENCOUNTER — Telehealth: Payer: Self-pay

## 2020-03-17 NOTE — Chronic Care Management (AMB) (Addendum)
Chronic Care Management Pharmacy Assistant   Name: Antonio [name]  MRN: [id number] DOB: 05/27/1945  Reason for Encounter: Disease state - Update BP log  PCP : Pleas Koch, NP  Allergies:  No Known Allergies  Medications: Outpatient Encounter Medications as of 03/17/2020  Medication Sig Note   acetaminophen (TYLENOL) 500 MG tablet Take 1,000 mg by mouth every 6 (six) hours. Rapid release    amLODipine (NORVASC) 5 MG tablet Take 5 mg by mouth daily.    aspirin 81 MG tablet Take 81 mg by mouth daily.    atorvastatin (LIPITOR) 20 MG tablet TAKE 1 TABLET BY MOUTH EVERY EVENING FOR CHOLESTEROL    buPROPion (WELLBUTRIN SR) 150 MG 12 hr tablet One tab daily for 5 days, twice a day starting on day 6. (Patient not taking: Reported on 03/03/2020) 02/16/2020: Have not started   ELIQUIS 5 MG TABS tablet TAKE 1 TABLET BY MOUTH TWICE A DAY    ferrous gluconate (FERGON) 324 MG tablet Take 1 tablet (324 mg total) by mouth daily with breakfast. (Patient not taking: No sig reported)    finasteride (PROSCAR) 5 MG tablet Take 1 tablet (5 mg total) by mouth daily.    glipiZIDE (GLUCOTROL) 10 MG tablet Take 10 mg by mouth daily before supper.     GLUCOSAMINE HCL PO Take 1 tablet by mouth daily.    insulin detemir (LEVEMIR) 100 UNIT/ML injection Inject 20 units at bedtime. (Patient taking differently: Inject 15 Units into the skin daily.)    lisinopril-hydrochlorothiazide (ZESTORETIC) 20-25 MG tablet TAKE 1 TABLET BY MOUTH EVERY DAY FOR BLOOD PRESSURE (Patient not taking: Reported on 03/03/2020)    metFORMIN (GLUCOPHAGE) 500 MG tablet TAKE 2 TABLETS BY MOUTH TWICE A DAY WITH MEAL FOR DIABETES    metoprolol tartrate (LOPRESSOR) 50 MG tablet TAKE 1/2 TABLET BY MOUTH TWICE A DAY    pantoprazole (PROTONIX) 40 MG tablet Take 40 mg by mouth daily.    tamsulosin (FLOMAX) 0.4 MG CAPS capsule Take 2 capsules by mouth once daily (Patient taking differently: Take 0.4 mg by mouth 2 (two) times daily.)     triamcinolone cream (KENALOG) 0.5 % Apply 1 application topically 2 (two) times daily.    vitamin B-12 (CYANOCOBALAMIN) 1000 MCG tablet Take 1,000 mcg by mouth daily.    No facility-administered encounter medications on file as of 03/17/2020.    Current Diagnosis: Patient Active Problem List   Diagnosis Date Noted   Right buttock pain 01/12/2020   Fall at home, initial encounter 01/12/2020   Rash and nonspecific skin eruption 12/19/2019   Chronic pain of right hip 10/17/2019   Ischemic leg 05/29/2018   PAD (peripheral artery disease) ([place]) 12/12/2017   Pain in limb 12/11/2017   GERD (gastroesophageal reflux disease) 04/30/2017   BPH with obstruction/lower urinary tract symptoms 10/14/2014   Essential hypertension 06/26/2014   Diabetes mellitus type 2, controlled ([place]) 06/26/2014   HLD (hyperlipidemia) 06/27/2006   Coronary atherosclerosis 06/27/2006   Tobacco abuse 06/27/2006     [name]. Spoke with patient regarding BP  Recent Office Vitals: BP Readings from Last 3 Encounters:  01/20/20 (!) 152/60  01/12/20 122/80  12/19/19 124/62   Pulse Readings from Last 3 Encounters:  01/20/20 79  01/12/20 69  12/19/19 62    Wt Readings from Last 3 Encounters:  01/20/20 166 lb (75.3 kg)  01/12/20 160 lb 3 oz (72.7 kg)  12/19/19 167 lb (75.8 kg)  Kidney Function Lab Results  Component Value Date/Time   CREATININE 1.19 12/02/2019 08:53 AM   CREATININE 1.13 09/24/2018 08:19 AM   GFR 59.99 (L) 12/02/2019 08:53 AM   GFRNONAA >60 05/31/2018 05:29 AM   GFRAA >60 05/31/2018 05:29 AM    BMP Latest Ref Rng & Units 12/02/2019 09/24/2018 05/31/2018  Glucose 70 - 99 mg/dL 74 154(H) 195(H)  BUN 6 - 23 mg/dL 26(H) 30(H) 17  Creatinine 0.40 - 1.50 mg/dL 1.19 1.13 0.91  BUN/Creat Ratio 10 - 22 - - -  Sodium 135 - 145 mEq/L 142 139 136  Potassium 3.5 - 5.1 mEq/L 5.2(H) 4.3 3.9  Chloride 96 - 112 mEq/L 113(H) 104 103  CO2 19 - 32 mEq/L 21 25 26    Calcium 8.4 - 10.5 mg/dL 8.9 9.5 8.4(L)    Current antihypertensive regimen:  Lisinopril/HCTZ 20/25 mg - 1 tablet daily (not taking) Metoprolol tartrate 50 mg - 1/2 tablet twice daily Amlodipine 5 mg - 1 tablet daily  How often are you checking your Blood Pressure? Patient states his is not monitoring blood pressure at this time.   Adherence Review: Is the patient currently on ACE/ARB medication? No - Lisinopril on med list but per Michelle's note - "No longer taking lisinopril/HCTZ - pt reports it was held due to low BP by Anda Kraft." Does the patient have >5 day gap between last estimated fill dates? CPP to review.  Patient states he has not yet purchased a blood pressure monitor. Asked patient if he thought he would be able to obtain a monitor in the next week. Patient stated he should be able to. Set up follow up call for 03/31/20.   Follow-Up:  Pharmacist Review  [name], CPP notified  Margaretmary Dys, [place] Assistant [phone]  I have reviewed the care management and care coordination activities outlined in this encounter and I am certifying that I agree with the content of this note. No further action required. Aware patient not taking lisinopril/HCTZ. Will assess home BP and consider resuming.   [name], PharmD Clinical Pharmacist [facility] at [facility] [phone]

## 2020-03-29 ENCOUNTER — Other Ambulatory Visit: Payer: Self-pay | Admitting: Urology

## 2020-03-29 DIAGNOSIS — N401 Enlarged prostate with lower urinary tract symptoms: Secondary | ICD-10-CM

## 2020-03-29 DIAGNOSIS — N138 Other obstructive and reflux uropathy: Secondary | ICD-10-CM

## 2020-03-31 ENCOUNTER — Other Ambulatory Visit: Payer: Self-pay | Admitting: Primary Care

## 2020-03-31 DIAGNOSIS — K2100 Gastro-esophageal reflux disease with esophagitis, without bleeding: Secondary | ICD-10-CM

## 2020-03-31 NOTE — Chronic Care Management (AMB) (Addendum)
Contacted Antonio [name] to update BP log. He states he was able to purchase a new monitor and has been keeping up with readings.  Current antihypertensive regimen:  Lisinopril/HCTZ 20/25 mg - 1 tablet daily - (states he started taking again 03/25/20 or 2/18)  Metoprolol tartrate 50 mg - 1/2 tablet twice daily Amlodipine 5 mg - 1 tablet daily  Readings are as follows: No pulse readings.  03/25/20- 167/75 03/26/20- 210/86 03/27/20- 178/53 03/28/20- 155/69 03/29/20- 131/76 03/30/20- 142/60 03/31/20- 196/66 (reports he had country ham the night before)  States he checks blood pressure around 9:30 AM after taking medications. He states he does limit his salt intake and uses salt substitute. He denies taking any cold or sinus medication.    Follow-Up:  Pharmacist Review  [name], CPP notified  Margaretmary Dys, [place] Assistant [phone]  -----------------  Contacted patient by telephone on 2/23 at 5 PM to discuss readings. He reports using a wrist cuff for monitoring. Discussed making sure he holds his arm at heart level as wrist cuffs can be less accurate. He denies any dizziness, chest pain, blurred vision, shortness of breath, headache. He feels fine. Reports he ate a salty ham last night which may have caused high BP this morning. Asked him to continue monitoring BP and continue the lisinopril/HCTZ daily with other medications. Asked him to call with readings > 180/120. CMA to follow up in 2 weeks.   [name], PharmD Clinical Pharmacist [facility] at [facility] [phone]

## 2020-04-01 ENCOUNTER — Other Ambulatory Visit: Payer: Self-pay

## 2020-04-01 NOTE — Chronic Care Management (AMB) (Addendum)
Contacted Antonio [name] to get an update on his blood pressure since he had a very high reading yesterday. Patient states BP was 165/59 with a pulse of 59 today.   Follow-Up:  Pharmacist Review  [name], CPP notified  Margaretmary Dys, [place] [facility] Assistant [phone]

## 2020-04-05 ENCOUNTER — Other Ambulatory Visit (HOSPITAL_COMMUNITY)
Admission: RE | Admit: 2020-04-05 | Discharge: 2020-04-05 | Disposition: A | Discharge status: Home / Self Care | Payer: Medicare Other | Source: Ambulatory Visit | Attending: Gastroenterology | Admitting: Gastroenterology

## 2020-04-05 DIAGNOSIS — Z20822 Contact with and (suspected) exposure to covid-19: Secondary | ICD-10-CM | POA: Diagnosis not present

## 2020-04-05 DIAGNOSIS — Z01812 Encounter for preprocedural laboratory examination: Secondary | ICD-10-CM | POA: Diagnosis not present

## 2020-04-05 LAB — SARS CORONAVIRUS 2 (TAT 6-24 HRS): SARS Coronavirus 2: NEGATIVE

## 2020-04-08 ENCOUNTER — Ambulatory Visit (HOSPITAL_COMMUNITY)
Admission: RE | Admit: 2020-04-08 | Discharge: 2020-04-08 | Disposition: A | Discharge status: Home / Self Care | Payer: Medicare Other | Attending: Gastroenterology | Admitting: Gastroenterology

## 2020-04-08 ENCOUNTER — Encounter (HOSPITAL_COMMUNITY)
Admission: RE | Disposition: A | Discharge status: Home / Self Care | Payer: Self-pay | Source: Home / Self Care | Attending: Gastroenterology

## 2020-04-08 ENCOUNTER — Encounter (HOSPITAL_COMMUNITY): Payer: Self-pay | Admitting: Gastroenterology

## 2020-04-08 ENCOUNTER — Ambulatory Visit (HOSPITAL_COMMUNITY): Payer: Medicare Other | Admitting: Anesthesiology

## 2020-04-08 ENCOUNTER — Other Ambulatory Visit: Payer: Self-pay

## 2020-04-08 DIAGNOSIS — Z794 Long term (current) use of insulin: Secondary | ICD-10-CM | POA: Diagnosis not present

## 2020-04-08 DIAGNOSIS — K31819 Angiodysplasia of stomach and duodenum without bleeding: Secondary | ICD-10-CM

## 2020-04-08 DIAGNOSIS — D509 Iron deficiency anemia, unspecified: Secondary | ICD-10-CM | POA: Diagnosis not present

## 2020-04-08 DIAGNOSIS — Z7901 Long term (current) use of anticoagulants: Secondary | ICD-10-CM | POA: Diagnosis not present

## 2020-04-08 DIAGNOSIS — Z79899 Other long term (current) drug therapy: Secondary | ICD-10-CM | POA: Insufficient documentation

## 2020-04-08 DIAGNOSIS — Z720 Tobacco use: Secondary | ICD-10-CM | POA: Diagnosis not present

## 2020-04-08 DIAGNOSIS — K219 Gastro-esophageal reflux disease without esophagitis: Secondary | ICD-10-CM | POA: Diagnosis not present

## 2020-04-08 DIAGNOSIS — K3189 Other diseases of stomach and duodenum: Secondary | ICD-10-CM | POA: Insufficient documentation

## 2020-04-08 DIAGNOSIS — Z7984 Long term (current) use of oral hypoglycemic drugs: Secondary | ICD-10-CM | POA: Insufficient documentation

## 2020-04-08 DIAGNOSIS — Z7982 Long term (current) use of aspirin: Secondary | ICD-10-CM | POA: Insufficient documentation

## 2020-04-08 DIAGNOSIS — K2980 Duodenitis without bleeding: Secondary | ICD-10-CM | POA: Diagnosis not present

## 2020-04-08 HISTORY — PX: HOT HEMOSTASIS: SHX5433

## 2020-04-08 HISTORY — PX: ESOPHAGOGASTRODUODENOSCOPY (EGD) WITH PROPOFOL: SHX5813

## 2020-04-08 LAB — GLUCOSE, CAPILLARY: Glucose-Capillary: 138 mg/dL — ABNORMAL HIGH (ref 70–99)

## 2020-04-08 SURGERY — ESOPHAGOGASTRODUODENOSCOPY (EGD) WITH PROPOFOL
Anesthesia: Monitor Anesthesia Care

## 2020-04-08 MED ORDER — PROPOFOL 500 MG/50ML IV EMUL
INTRAVENOUS | Status: DC | PRN
  Administered 2020-04-08: 160 ug/kg/min via INTRAVENOUS

## 2020-04-08 MED ORDER — LIDOCAINE HCL (CARDIAC) PF 100 MG/5ML IV SOSY
PREFILLED_SYRINGE | INTRAVENOUS | Status: DC | PRN
  Administered 2020-04-08: 100 mg via INTRAVENOUS

## 2020-04-08 MED ORDER — PROPOFOL 10 MG/ML IV BOLUS
INTRAVENOUS | Status: DC | PRN
  Administered 2020-04-08: 40 mg via INTRAVENOUS

## 2020-04-08 MED ORDER — LACTATED RINGERS IV SOLN: INTRAVENOUS | Status: DC | PRN

## 2020-04-08 MED ORDER — SODIUM CHLORIDE 0.9 % IV SOLN: INTRAVENOUS | Status: DC

## 2020-04-08 NOTE — Transfer of Care (Signed)
Immediate Anesthesia Transfer of Care Note  Patient: Antonio [name]  Procedure(s) Performed: ESOPHAGOGASTRODUODENOSCOPY (EGD) WITH PROPOFOL (N/A ) HOT HEMOSTASIS (ARGON PLASMA COAGULATION/BICAP) (N/A )  Patient Location: PACU and Endoscopy Unit  Anesthesia Type:MAC  Level of Consciousness: awake and drowsy  Airway & Oxygen Therapy: Patient Spontanous Breathing and Patient connected to face mask oxygen  Post-op Assessment: Report given to RN and Post -op Vital signs reviewed and stable  Post vital signs: Reviewed and stable  Last Vitals:  Vitals Value Taken Time  BP 114/41 04/08/20 1010  Temp 36.5 C 04/08/20 1005  Pulse 55 04/08/20 1013  Resp 16 04/08/20 1013  SpO2 96 % 04/08/20 1013  Vitals shown include unvalidated device data.  Last Pain:  Vitals:   04/08/20 1010  TempSrc:   PainSc: 0-No pain         Complications: No complications documented.

## 2020-04-08 NOTE — Discharge Instructions (Signed)
YOU HAD AN ENDOSCOPIC PROCEDURE TODAY: Refer to the procedure report and other information in the discharge instructions given to you for any specific questions about what was found during the examination. If this information does not answer your questions, please call [place] office at [phone] to clarify.   YOU SHOULD EXPECT: Some feelings of bloating in the abdomen. Passage of more gas than usual. Walking can help get rid of the air that was put into your GI tract during the procedure and reduce the bloating. If you had a lower endoscopy (such as a colonoscopy or flexible sigmoidoscopy) you may notice spotting of blood in your stool or on the toilet paper. Some abdominal soreness may be present for a day or two, also.  DIET: Your first meal following the procedure should be a light meal and then it is ok to progress to your normal diet. A half-sandwich or bowl of soup is an example of a good first meal. Heavy or fried foods are harder to digest and may make you feel nauseous or bloated. Drink plenty of fluids but you should avoid alcoholic beverages for 24 hours. If you had a esophageal dilation, please see attached instructions for diet.    ACTIVITY: Your care partner should take you home directly after the procedure. You should plan to take it easy, moving slowly for the rest of the day. You can resume normal activity the day after the procedure however YOU SHOULD NOT DRIVE, use power tools, machinery or perform tasks that involve climbing or major physical exertion for 24 hours (because of the sedation medicines used during the test).   SYMPTOMS TO REPORT IMMEDIATELY: A gastroenterologist can be reached at any hour. Please call [phone]  for any of the following symptoms:   Following upper endoscopy (EGD, EUS, ERCP, esophageal dilation) Vomiting of blood or coffee ground material  New, significant abdominal pain  New, significant chest pain or pain under the shoulder blades  Painful or  persistently difficult swallowing  New shortness of breath  Black, tarry-looking or red, bloody stools  FOLLOW UP:  If any biopsies were taken you will be contacted by phone or by letter within the next 1-3 weeks. Call [phone]  if you have not heard about the biopsies in 3 weeks.  Please also call with any specific questions about appointments or follow up tests.  

## 2020-04-08 NOTE — Op Note (Signed)
[facility] Patient Name: Antonio [name] Procedure Date: 04/08/2020 MRN: [id number] Attending MD: Ladene Artist , MD Date of Birth: 27-Jan-1946 CSN: [id number] Age: 75 Admit Type: Outpatient Procedure:                Upper GI endoscopy Indications:              Iron deficiency anemia, For therapy of angioectasia                            of the stomach Providers:                Pricilla Riffle. Fuller Plan, MD, Erenest Rasher, RN, Laverda Sorenson, Technician, Stephanie [place] CRNA Referring MD:             Pleas Koch, NP Medicines:                Monitored Anesthesia Care Complications:            No immediate complications. Estimated Blood Loss:     Estimated blood loss was minimal. Procedure:                Pre-Anesthesia Assessment:                           - Prior to the procedure, a History and Physical                            was performed, and patient medications and                            allergies were reviewed. The patient's tolerance of                            previous anesthesia was also reviewed. The risks                            and benefits of the procedure and the sedation                            options and risks were discussed with the patient.                            All questions were answered, and informed consent                            was obtained. Prior Anticoagulants: The patient has                            taken Eliquis (apixaban), last dose was 2 days                            prior to procedure. ASA Grade Assessment: III - A  patient with severe systemic disease. After                            reviewing the risks and benefits, the patient was                            deemed in satisfactory condition to undergo the                            procedure.                           After obtaining informed consent, the endoscope was                            passed  under direct vision. Throughout the                            procedure, the patient's blood pressure, pulse, and                            oxygen saturations were monitored continuously. The                            GIF-H190 ([id number]) Olympus gastroscope was                            introduced through the mouth, and advanced to the                            second part of duodenum. The upper GI endoscopy was                            accomplished without difficulty. The patient                            tolerated the procedure well. Scope In: Scope Out: Findings:      The examined esophagus was slightly tortuous and otherwise normal.      A single small angiodysplastic lesion with no bleeding was found on the       lesser curvature of the stomach. Fulguration to ablate the lesion by       argon plasma was successful.      The exam of the stomach was otherwise normal.      Localized mildly erythematous mucosa with minimal contact bleeding and       with no stigmata of bleeding was found in the duodenal bulb.      The exam of the duodenum was otherwise normal. Impression:               - Slightly tortuous and otherwise normal esophagus.                           - A single non-bleeding angiodysplastic lesion in  the stomach. Treated with argon plasma coagulation                            (APC).                           - Mild erythematous duodenopathy.                           - No specimens collected. Moderate Sedation:      Not Applicable - Patient had care per Anesthesia. Recommendation:           - Patient has a contact number available for                            emergencies. The signs and symptoms of potential                            delayed complications were discussed with the                            patient. Return to normal activities tomorrow.                            Written discharge instructions were provided to the                             patient.                           - Resume previous diet.                           - Continue present medications including FeSO4 325                            mg po bid with meals.                           - Resume Eliquis (apixaban) at prior dose in 2                            days. Refer to managing physician for further                            adjustment of therapy.                           - CBC and ferritin in 1 week. Procedure Code(s):        --- Professional ---                           [phone], Esophagogastroduodenoscopy, flexible,                            transoral; with ablation of tumor(s), polyp(s), or  other lesion(s) (includes pre- and post-dilation                            and guide wire passage, when performed) Diagnosis Code(s):        --- Professional ---                           K31.819, Angiodysplasia of stomach and duodenum                            without bleeding                           K31.89, Other diseases of stomach and duodenum                           D50.9, Iron deficiency anemia, unspecified CPT copyright 2019 American Medical Association. All rights reserved. The codes documented in this report are preliminary and upon coder review may  be revised to meet current compliance requirements. Ladene Artist, MD 04/08/2020 10:16:23 AM This report has been signed electronically. Number of Addenda: 0

## 2020-04-08 NOTE — Anesthesia Postprocedure Evaluation (Signed)
Anesthesia Post Note  Patient: XZAVIER SWINGER  Procedure(s) Performed: ESOPHAGOGASTRODUODENOSCOPY (EGD) WITH PROPOFOL (N/A ) HOT HEMOSTASIS (ARGON PLASMA COAGULATION/BICAP) (N/A )     Patient location during evaluation: PACU Anesthesia Type: MAC Level of consciousness: awake and alert Pain management: pain level controlled Vital Signs Assessment: post-procedure vital signs reviewed and stable Respiratory status: spontaneous breathing, nonlabored ventilation and respiratory function stable Cardiovascular status: blood pressure returned to baseline and stable Postop Assessment: no apparent nausea or vomiting Anesthetic complications: no   No complications documented.  Last Vitals:  Vitals:   04/08/20 1020 04/08/20 1030  BP: (!) 132/53 (!) 163/39  Pulse: (!) 51 (!) 57  Resp: 13 17  Temp:    SpO2: 97% 97%    Last Pain:  Vitals:   04/08/20 1030  TempSrc:   PainSc: 0-No pain                 Pervis Hocking

## 2020-04-08 NOTE — H&P (Signed)
ASSESSMENT AND PLAN    # Abnormal esophagus on chest CT scan.  --Similar findings on chest CT scan August 2020 with findings of only a tortous esophaus on EGD with empirical dilation Sept 2020. Recent repeat chest CT scan again demonstrates distal esophageal thickening, slightly progressive. He does have very occasional solid food dysphagia which had improved after last dilation --For further evaluation patient will be scheduled for EGD with possible repeat dilation. The risks and benefits of EGD were discussed and the patient agrees to proceed.   # Chronic anticoagulation --Hold Eliquis for 2 days before procedure - will instruct when and how to resume after procedure. Patient understands that there is a low but real risk of cardiovascular event such as heart attack, stroke, or embolism /  thrombosis while off blood thinner. The patient consents to proceed. Will communicate by phone or EMR with patient's prescribing provider to confirm that holding Eliquis is reasonable in this case.   # Hx of adenomatous colon polyps including an 11 mm cecal polyp in 2016.  --He is overdue for polyp surveillance. Patient will be scheduled for a colonoscopy (at time of EGD). The risks and benefits of colonoscopy with possible polypectomy / biopsies were discussed and the patient agrees to proceed.    HISTORY OF PRESENT ILLNESS     Primary Gastroenterologist :  Lucio Edward, MD  Chief Complaint : abnormal esophagus on CT scan   Antonio [name] is a 75 y.o. male with PMH / [place] significant for,  but not necessarily limited to: DM, CAD, HTN, hyperlipidemia,  tobacco abuse, emphysema, PAD / angioplasty / stents,   Patient is referred by PCP for evaluation of abnormal esophagus on lung cancer screening CT scan which he gets annually. He still smokes. Patient initially denied any swallowing problems but upon further questioning he endorses very occasional solid food dysphagia, mainly in the mornings at  breakfast. Generally after waiting a minute / walking around the food will pass. He had an EGD Sept 2020 for evaluation of dysphagia and abnormal chest  CT scan suggesting mass-like thickening of the distal esophagus. Esophagus was tortuous but no other esophageal abnormalities. Esophagus was empirically dilated. Patient says he feels like post dilation that dysphagia improved. He isn't certain when swallowing problems started to recur but says the infrequent episodes are not particularly bothersome. He is on PPI therapy. No GERD symptoms   Data Reviewed: 10/08/19 Chest CT scan IMPRESSION: 1. Lung-RADS 1, negative. Continue annual screening with low-dose chest CT without contrast in 12 months. 2. Distal esophageal wall thickening appears somewhat progressive. Esophageal neoplasm cannot be excluded. Esophagram may be helpful in further evaluation, as clinically indicated. These results will be called to the ordering clinician or representative by the Radiologist Assistant, and communication documented in the PACS or [employer]. 3. Left adrenal adenoma. 4. Aortic atherosclerosis (ICD10-I70.0). Coronary artery calcification. 5. Emphysema (ICD10-J43.9).  Previous Endoscopic Evaluations / Pertinent Studies:   10/27/19 EGD for dysphagia and abnormal esophagus on chest CT scan -No endoscopic esophageal abnormality to explain patient's dysphagia. Esophagus dilated. Dilated. - Normal stomach. - Normal duodenal bulb and second portion of the duodenum. - No specimens collected.      Past Medical History:  Diagnosis Date  . Arthritis   . 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 Coronary atherosclerosis of unspecified type of vessel, native or graft   . Diabetes mellitus    Type II  . Emphysema of lung ([place])   .  Esophageal stricture   . Gastritis   . GERD (gastroesophageal reflux disease)   . Hiatal hernia   . Hx of colonic polyps   .  Hyperlipidemia   . Hypertension   . Internal hemorrhoids without mention of complication   . Lumbago   . Pain in both lower legs   . PSA elevation    now averaging 2's  . Tobacco abuse   . URI (upper respiratory infection)   . Urinary retention     Current Medications, Allergies, Past Surgical History, Family History and Social History were reviewed in [employer] record.         Current Outpatient Medications  Medication Sig Dispense Refill  . acetaminophen (TYLENOL) 500 MG tablet Take 1,000 mg by mouth in the morning and at bedtime.    [name] amLODipine (NORVASC) 5 MG tablet Take by mouth.    [name] aspirin 81 MG tablet Take 81 mg by mouth daily.    [name] atorvastatin (LIPITOR) 20 MG tablet TAKE 1 TABLET BY MOUTH EVERY EVENING FOR CHOLESTEROL 90 tablet 1  . ELIQUIS 5 MG TABS tablet TAKE 1 TABLET BY MOUTH TWICE A DAY 60 tablet 4  . finasteride (PROSCAR) 5 MG tablet Take 1 tablet (5 mg total) by mouth daily. 90 tablet 3  . glipiZIDE (GLUCOTROL) 10 MG tablet Take 10 mg by mouth daily before supper.     [name] glucose blood ([employer] TEST STRIPS) test strip Use one strip three times daily.    . insulin detemir (LEVEMIR) 100 UNIT/ML injection Inject 20 units at bedtime. (Patient taking differently: every morning. Inject 15 units at breakfast) 10 pen 5  . lisinopril-hydrochlorothiazide (ZESTORETIC) 20-25 MG tablet TAKE 1 TABLET BY MOUTH EVERY DAY FOR BLOOD PRESSURE 90 tablet 1  . metFORMIN (GLUCOPHAGE) 500 MG tablet TAKE 2 TABLETS BY MOUTH TWICE DAILY WITH A MEAL FOR DIABETES. 360 tablet 3  . metoprolol tartrate (LOPRESSOR) 50 MG tablet TAKE 1/2 TABLET BY MOUTH TWICE A DAY 90 tablet 1  . OVER THE COUNTER MEDICATION Glucosomine one tablet daily.    . pantoprazole (PROTONIX) 40 MG tablet Take 1 tablet (40 mg total) by mouth daily. For heartburn. 90 tablet 0  . tamsulosin (FLOMAX) 0.4 MG CAPS capsule Take 2 capsules by mouth once daily 180 capsule 3  . varenicline  (CHANTIX STARTING MONTH PAK) 0.5 MG X 11 & 1 MG X 42 tablet Take one 0.5 mg tablet by mouth once daily for 3 days, then increase to one 0.5 mg tablet twice daily for 4 days, then increase to one 1 mg tablet twice daily. 53 tablet 0   No current facility-administered medications for this visit.    Review of Systems: No chest pain. No shortness of breath. No urinary complaints.   PHYSICAL EXAM :       Wt Readings from Last 3 Encounters:  11/19/19 166 lb (75.3 kg)  10/17/19 167 lb (75.8 kg)  10/08/19 170 lb (77.1 kg)    BP 110/60 (Patient Position: Sitting, Cuff Size: Normal)   Pulse 72 Comment: irregular  Ht 5' 5.25" (1.657 m) Comment: height measured without shoes  Wt 166 lb (75.3 kg)   BMI 27.41 kg/m  Constitutional:  Pleasant male in no acute distress. Psychiatric: Normal mood and affect. Behavior is normal. EENT: Pupils normal.  Conjunctivae are normal. No scleral icterus. Neck supple.  Cardiovascular: Normal rate, regular rhythm. No edema Pulmonary/chest: Effort normal and breath sounds normal. No wheezing, rales or rhonchi. Abdominal: Soft,  nondistended, nontender. Bowel sounds active throughout. There are no masses palpable. No hepatomegaly. Neurological: Alert and oriented to person place and time. Skin: Skin is warm and dry. No rashes noted.  Tye Savoy, NP  11/19/2019, 3:17 PM

## 2020-04-08 NOTE — Anesthesia Preprocedure Evaluation (Addendum)
Anesthesia Evaluation  Patient identified by MRN, date of birth, ID band Patient awake    Reviewed: Allergy & Precautions, NPO status , Patient's Chart, lab work & pertinent test results, reviewed documented beta blocker date and time   Airway Mallampati: II  TM Distance: >3 FB Neck ROM: Full    Dental no notable dental hx.    Pulmonary asthma , COPD, Current Smoker and Patient abstained from smoking.,  Still smoking, 86 pack year history    Pulmonary exam normal breath sounds clear to auscultation       Cardiovascular hypertension, Pt. on home beta blockers and Pt. on medications + CAD and + Peripheral Vascular Disease  Normal cardiovascular exam Rhythm:Regular Rate:Normal  Echo 2003 normal   Neuro/Psych negative neurological ROS  negative psych ROS   GI/Hepatic Neg liver ROS, hiatal hernia, GERD  Medicated and Controlled,Gastric AVMs, hx esophageal stricture   Endo/Other  diabetes, Well Controlled, Type 2, Oral Hypoglycemic Agents, Insulin DependentA1c 6.8  Renal/GU negative Renal ROS  negative genitourinary   Musculoskeletal  (+) Arthritis , Osteoarthritis,    Abdominal   Peds  Hematology negative hematology ROS (+)   Anesthesia Other Findings   Reproductive/Obstetrics negative OB ROS                            Anesthesia Physical Anesthesia Plan  ASA: II  Anesthesia Plan: MAC   Post-op Pain Management:    Induction:   PONV Risk Score and Plan: 0 and Propofol infusion, TIVA and Treatment may vary due to age or medical condition  Airway Management Planned: Natural Airway and Nasal Cannula  Additional Equipment: None  Intra-op Plan:   Post-operative Plan:   Informed Consent: I have reviewed the patients History and Physical, chart, labs and discussed the procedure including the risks, benefits and alternatives for the proposed anesthesia with the patient or authorized  representative who has indicated his/her understanding and acceptance.       Plan Discussed with: CRNA  Anesthesia Plan Comments:        Anesthesia Quick Evaluation

## 2020-04-09 ENCOUNTER — Encounter (HOSPITAL_COMMUNITY): Payer: Self-pay | Admitting: Gastroenterology

## 2020-04-12 NOTE — H&P (Signed)
Chief Complaint :abnormal esophagus on CT scan  Antonio [name] a 75 y.o.malewith PMH significant for DM, CAD, HTN, hyperlipidemia, tobacco abuse, emphysema,PAD / angioplasty / stents,   Patient is referred by PCP for evaluation of abnormal esophagus onlung cancer screeningCT scanwhich he gets annually. He still smokes.Patient initially denied any swallowing problems but upon further questioning he endorses occasionalsolid food dysphagia, mainly in the mornings at breakfast. Generally after waiting a minute / walking around the food will pass. He had an EGD Sept 2020 for evaluation of dysphagia and abnormal chest CT scan suggesting mass-like thickening of the distal esophagus. Esophagus was tortuous but no other esophageal abnormalities. Esophagus was empirically dilated. Patient says he feels like post dilation that dysphagia improved. He isn't certain when swallowing problems started to recur but says the infrequent episodes are not particularly bothersome.He is on PPI therapy. No GERD symptoms   Data Reviewed: 10/08/19 Chest CT scan IMPRESSION: 1. Lung-RADS 1, negative. Continue annual screening with low-dose chest CT without contrast in 12 months. 2. Distal esophageal wall thickening appears somewhat progressive. Esophageal neoplasm cannot be excluded. Esophagram may be helpful in further evaluation, as clinically indicated. These results will be called to the ordering clinician or representative by the Radiologist Assistant, and communication documented in the PACS or [employer]. 3. Left adrenal adenoma. 4. Aortic atherosclerosis (ICD10-I70.0). Coronary artery calcification. 5. Emphysema (ICD10-J43.9).  Previous Endoscopic Evaluations:  10/27/19 EGD for dysphagia and abnormal esophagus on chest CT scan -No endoscopic esophageal abnormality to explain patient's dysphagia. Esophagus dilated. Dilated. - Normal stomach. - Normal duodenal bulb and second  portion of the duodenum. - No specimens collected.      Past Medical History:  Diagnosis Date  . Arthritis   . 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 Coronary atherosclerosis of unspecified type of vessel, native or graft   . Diabetes mellitus    Type II  . Emphysema of lung ([place])   . Esophageal stricture   . Gastritis   . GERD (gastroesophageal reflux disease)   . Hiatal hernia   . Hx of colonic polyps   . Hyperlipidemia   . Hypertension   . Internal hemorrhoids without mention of complication   . Lumbago   . Pain in both lower legs   . PSA elevation    now averaging 2's  . Tobacco abuse   . URI (upper respiratory infection)   . Urinary retention     Current Medications, Allergies, Past Surgical History, Family History and Social History were reviewed in [employer] record.        Current Outpatient Medications  Medication Sig Dispense Refill  . acetaminophen (TYLENOL) 500 MG tablet Take 1,000 mg by mouth in the morning and at bedtime.    [name] amLODipine (NORVASC) 5 MG tablet Take by mouth.    [name] aspirin 81 MG tablet Take 81 mg by mouth daily.    [name] atorvastatin (LIPITOR) 20 MG tablet TAKE 1 TABLET BY MOUTH EVERY EVENING FOR CHOLESTEROL 90 tablet 1  . ELIQUIS 5 MG TABS tablet TAKE 1 TABLET BY MOUTH TWICE A DAY 60 tablet 4  . finasteride (PROSCAR) 5 MG tablet Take 1 tablet (5 mg total) by mouth daily. 90 tablet 3  . glipiZIDE (GLUCOTROL) 10 MG tablet Take 10 mg by mouth daily before supper.     [name] glucose blood ([employer] TEST STRIPS) test strip Use one strip three times daily.    . insulin  detemir (LEVEMIR) 100 UNIT/ML injection Inject 20 units at bedtime. (Patient taking differently: every morning. Inject 15 units at breakfast) 10 pen 5  . lisinopril-hydrochlorothiazide (ZESTORETIC) 20-25 MG tablet TAKE 1 TABLET BY MOUTH EVERY DAY FOR BLOOD PRESSURE 90 tablet 1  . metFORMIN (GLUCOPHAGE)  500 MG tablet TAKE 2 TABLETS BY MOUTH TWICE DAILY WITH A MEAL FOR DIABETES. 360 tablet 3  . metoprolol tartrate (LOPRESSOR) 50 MG tablet TAKE 1/2 TABLET BY MOUTH TWICE A DAY 90 tablet 1  . OVER THE COUNTER MEDICATION Glucosomine one tablet daily.    . pantoprazole (PROTONIX) 40 MG tablet Take 1 tablet (40 mg total) by mouth daily. For heartburn. 90 tablet 0  . tamsulosin (FLOMAX) 0.4 MG CAPS capsule Take 2 capsules by mouth once daily 180 capsule 3  . varenicline (CHANTIX STARTING MONTH PAK) 0.5 MG X 11 & 1 MG X 42 tablet Take one 0.5 mg tablet by mouth once daily for 3 days, then increase to one 0.5 mg tablet twice daily for 4 days, then increase to one 1 mg tablet twice daily. 53 tablet 0   No current facility-administered medications for this visit.    Review of Systems: No chest pain. No shortness of breath. No urinary complaints.       Wt Readings from Last 3 Encounters:  11/19/19 166 lb (75.3 kg)  10/17/19 167 lb (75.8 kg)  10/08/19 170 lb (77.1 kg)    BP 110/60 (Patient Position: Sitting, Cuff Size: Normal)  Pulse 72 Comment: irregular  Ht 5' 5.25" (1.657 m) Comment: height measured without shoes  Wt 166 lb (75.3 kg)  BMI 27.41 kg/m  Constitutional: Pleasant male in no acute distress. Psychiatric: Normal mood and affect. Behavior is normal. EENT: Pupils normal. Conjunctivae are normal. No scleral icterus. Necksupple.  Cardiovascular: Normal rate, regular rhythm. No edema Pulmonary/chest:Effort normal and breath sounds normal. No wheezing, rales or rhonchi. Abdominal: Soft, nondistended, nontender. Bowel sounds active throughout. There are no masses palpable. No hepatomegaly. Neurological: Alert and oriented to person place and time. Skin:Skin is warm and dry. No rashes noted.   Impression and recommendation:   1. Abnormalesophagus onchest CT scan. Similar findings on chest CT scanAugust 2020with findings of only a tortous esophaus on EGD with  empiric dilationSept 2020. Recentrepeat chestCT scanagain demonstrates distal esophageal thickening,slightly progressive. [place] has occasional solid food dysphagia which had improved after last dilation. For further evaluation patient will be scheduled for EGD with possible repeat dilation.The risks and benefits of EGD were discussed and the patient agrees to proceed.  2. Chronic anticoagulation. Hold Eliquis for 2 days before procedure - will instruct when and how to resume after procedure. Patient understands that there is a low but real risk of cardiovascular event such as heart attack, stroke, or embolism / thrombosis while off blood thinner. The patient consents to proceed. Will communicate by phone or EMR with patient's prescribing provider to confirm that holding Eliquis is reasonable in this case.   3. Personal history  of adenomatous colon polyps including an 11 mm cecal polyp in 2016.He is overdue for polyp surveillance.Patient will be scheduled for a colonoscopy. The risks and benefits of colonoscopy with possible polypectomy / biopsies were discussed and the patient agrees to proceed.  Pricilla Riffle. Fuller Plan , MD

## 2020-04-14 ENCOUNTER — Telehealth: Payer: Self-pay

## 2020-04-14 NOTE — Chronic Care Management (AMB) (Addendum)
Chronic Care Management Pharmacy Assistant   Name: Antonio [name]  MRN: [id number] DOB: March 24, 1945     Reason for Encounter: Disease State- HTN   Conditions to be addressed/monitored: HTN  Recent office visits: None since last CCM call  Recent consult visits:  04/08/20 - EGD   Hospital visits:  None in past 6 months    Medications: Outpatient Encounter Medications as of 04/14/2020  Medication Sig Note   acetaminophen (TYLENOL) 500 MG tablet Take 1,000 mg by mouth every 6 (six) hours. Rapid release    amLODipine (NORVASC) 5 MG tablet Take 5 mg by mouth daily.    aspirin 81 MG tablet Take 81 mg by mouth daily.    atorvastatin (LIPITOR) 20 MG tablet TAKE 1 TABLET BY MOUTH EVERY EVENING FOR CHOLESTEROL    buPROPion (WELLBUTRIN SR) 150 MG 12 hr tablet One tab daily for 5 days, twice a day starting on day 6. (Patient not taking: No sig reported) 02/16/2020: Have not started   ELIQUIS 5 MG TABS tablet TAKE 1 TABLET BY MOUTH TWICE A DAY    ferrous gluconate (FERGON) 324 MG tablet Take 1 tablet (324 mg total) by mouth daily with breakfast. (Patient not taking: No sig reported)    finasteride (PROSCAR) 5 MG tablet TAKE 1 TABLET BY MOUTH EVERY DAY    glipiZIDE (GLUCOTROL) 10 MG tablet Take 10 mg by mouth daily before supper.     GLUCOSAMINE HCL PO Take 1 tablet by mouth daily.    insulin detemir (LEVEMIR) 100 UNIT/ML injection Inject 20 units at bedtime. (Patient taking differently: Inject 15 Units into the skin daily.) 04/08/2020: Took half a dose    lisinopril-hydrochlorothiazide (ZESTORETIC) 20-25 MG tablet TAKE 1 TABLET BY MOUTH EVERY DAY FOR BLOOD PRESSURE    metFORMIN (GLUCOPHAGE) 500 MG tablet TAKE 2 TABLETS BY MOUTH TWICE A DAY WITH MEAL FOR DIABETES    metoprolol tartrate (LOPRESSOR) 50 MG tablet TAKE 1/2 TABLET BY MOUTH TWICE A DAY    pantoprazole (PROTONIX) 40 MG tablet Take 1 tablet (40 mg total) by mouth daily. For heartburn.    tamsulosin (FLOMAX) 0.4 MG CAPS capsule Take 1  capsule (0.4 mg total) by mouth 2 (two) times daily.    triamcinolone cream (KENALOG) 0.5 % Apply 1 application topically 2 (two) times daily.    vitamin B-12 (CYANOCOBALAMIN) 1000 MCG tablet Take 1,000 mcg by mouth daily.    No facility-administered encounter medications on file as of 04/14/2020.   [name]. Spoke with patient regarding BP  Recent Office Vitals: BP Readings from Last 3 Encounters:  04/08/20 (!) 163/39  01/20/20 (!) 152/60  01/12/20 122/80   Pulse Readings from Last 3 Encounters:  04/08/20 (!) 57  01/20/20 79  01/12/20 69    Wt Readings from Last 3 Encounters:  04/01/20 163 lb (73.9 kg)  01/20/20 166 lb (75.3 kg)  01/12/20 160 lb 3 oz (72.7 kg)     Kidney Function Lab Results  Component Value Date/Time   CREATININE 1.19 12/02/2019 08:53 AM   CREATININE 1.13 09/24/2018 08:19 AM   GFR 59.99 (L) 12/02/2019 08:53 AM   GFRNONAA >60 05/31/2018 05:29 AM   GFRAA >60 05/31/2018 05:29 AM    BMP Latest Ref Rng & Units 12/02/2019 09/24/2018 05/31/2018  Glucose 70 - 99 mg/dL 74 154(H) 195(H)  BUN 6 - 23 mg/dL 26(H) 30(H) 17  Creatinine 0.40 - 1.50 mg/dL 1.19 1.13 0.91  BUN/Creat Ratio 10 - 22 - - -  Sodium 135 - 145 mEq/L 142 139 136  Potassium 3.5 - 5.1 mEq/L 5.2(H) 4.3 3.9  Chloride 96 - 112 mEq/L 113(H) 104 103  CO2 19 - 32 mEq/L 21 25 26   Calcium 8.4 - 10.5 mg/dL 8.9 9.5 8.4(L)    Current antihypertensive regimen:  Lisinopril/HCTZ 20/25 mg - 1 tablet daily - (states he started taking again 03/25/20 or 2/18)  Metoprolol tartrate 50 mg - 1/2 tablet twice daily Amlodipine 5 mg - 1 tablet daily  How often are you checking your Blood Pressure? daily   Current home BP readings:  04/06/20- 166/64, 54 04/07/20 - 165/52, 52 04/09/20- 138/44, 50 04/10/20- 160/63, 53 04/12/20- 114/49, 49 04/13/20- 144/62, 61 04/14/20- 146/56, 54  States he is using wrist cuff. Takes BP a couple of hours after he takes his medications. States he uses salt  substitute or sea salt.   What recent interventions/DTPs have been made by any provider to improve Blood Pressure control since last CPP Visit: Advised correct way to use wrist monitor.   Any recent hospitalizations or ED visits since last visit with CPP? Yes EGD 04/08/20  What diet changes have been made to improve Blood Pressure Control?  Patient states he is no longer eating microwave dinners.   What exercise is being done to improve your Blood Pressure Control?  N/A  Adherence Review: Is the patient currently on ACE/ARB medication? Yes Does the patient have >5 day gap between last estimated fill dates? No  Star Rating Drugs: Atorvastatin 20 mg last filed 01/27/20 90DS Glipizide 10 mg last filled 01/30/20 90DS Lisinopril-hydrochlorothiazide 20-25mg  last filled 03/31/20 90DS Metformin 500 mg last filled 03/30/20 90DS  Patient denies feeling bad when pulse is in low 50s or high 40s.   Follow-Up:  Pharmacist Review  [name], CPP notified  Margaretmary Dys, [place] [phone]  Total time spent for month: 41  BP is above goal. I would like him to either purchase an [employer] monitor with an arm cuff (more accurate). Wrist cuffs tend to run high. Or we can schedule an office visit with PCP to evaluate. Please let me know what the patient prefers.  [name], PharmD Clinical Pharmacist [facility] at [facility] [phone]

## 2020-04-26 ENCOUNTER — Other Ambulatory Visit (INDEPENDENT_AMBULATORY_CARE_PROVIDER_SITE_OTHER): Payer: Self-pay | Admitting: Vascular Surgery

## 2020-04-27 ENCOUNTER — Other Ambulatory Visit: Payer: Self-pay | Admitting: Primary Care

## 2020-04-27 DIAGNOSIS — I10 Essential (primary) hypertension: Secondary | ICD-10-CM

## 2020-04-28 NOTE — Chronic Care Management (AMB) (Addendum)
[name]. Spoke with patient regarding BP  Recent Office Vitals: BP Readings from Last 3 Encounters:  04/08/20 (!) 163/39  01/20/20 (!) 152/60  01/12/20 122/80   Pulse Readings from Last 3 Encounters:  04/08/20 (!) 57  01/20/20 79  01/12/20 69    Wt Readings from Last 3 Encounters:  04/01/20 163 lb (73.9 kg)  01/20/20 166 lb (75.3 kg)  01/12/20 160 lb 3 oz (72.7 kg)     Kidney Function Lab Results  Component Value Date/Time   CREATININE 1.19 12/02/2019 08:53 AM   CREATININE 1.13 09/24/2018 08:19 AM   GFR 59.99 (L) 12/02/2019 08:53 AM   GFRNONAA >60 05/31/2018 05:29 AM   GFRAA >60 05/31/2018 05:29 AM    BMP Latest Ref Rng & Units 12/02/2019 09/24/2018 05/31/2018  Glucose 70 - 99 mg/dL 74 154(H) 195(H)  BUN 6 - 23 mg/dL 26(H) 30(H) 17  Creatinine 0.40 - 1.50 mg/dL 1.19 1.13 0.91  BUN/Creat Ratio 10 - 22 - - -  Sodium 135 - 145 mEq/L 142 139 136  Potassium 3.5 - 5.1 mEq/L 5.2(H) 4.3 3.9  Chloride 96 - 112 mEq/L 113(H) 104 103  CO2 19 - 32 mEq/L 21 25 26   Calcium 8.4 - 10.5 mg/dL 8.9 9.5 8.4(L)    Contacted pt to review new BP readings.  Current antihypertensive regimen:  Lisinopril/HCTZ 20/25 mg - 1 tablet daily - (resumed 03/25/20)  Metoprolol tartrate 50 mg - 1/2 tablet twice daily Amlodipine 5 mg - 1 tablet daily  How often are you checking your Blood Pressure? 1-2x per week   Current home BP readings:     Patient not at home, no readings available   What recent interventions/DTPs have been made by any provider to improve Blood Pressure control since last CPP Visit: Patient asked to purchase a new BP monitor that goes on his arm.   Any recent hospitalizations or ED visits since last visit with CPP? No   What diet changes have been made to improve Blood Pressure Control?  Patient states he is no longer eating microwave dinners.   What exercise is being done to improve your Blood Pressure Control?  N/A  Adherence Review: Is  the patient currently on ACE/ARB medication? Yes Does the patient have >5 day gap between last estimated fill dates?  CPP to review - no gaps in adherence   STAR rating drugs: Atorvastatin 20 mg last filed 01/27/20 90DS Glipizide 10 mg last filled 01/30/20 90DS Lisinopril-hydrochlorothiazide 20-25mg  last filled 03/31/20 90DS Metformin 500 mg last filled 03/30/20 90DS  Spoke with the patient. He did not have any new readings available today due to being outside. He states he did purchase a new monitor for his arm. He is doing well, advised we would F/U in 2 weeks to review new readings .  Follow-Up:  Pharmacist Review  [name], CPP notified  Margaretmary Dys, [place] Assistant [phone]  I have reviewed the care management and care coordination activities outlined in this encounter and I am certifying that I agree with the content of this note. No further action required.  [name], PharmD Clinical Pharmacist [facility] at [facility] [phone]   Total time spent for month:  CPA  45 mins CPP 15 mins

## 2020-05-05 DIAGNOSIS — E1142 Type 2 diabetes mellitus with diabetic polyneuropathy: Secondary | ICD-10-CM | POA: Diagnosis not present

## 2020-05-11 ENCOUNTER — Other Ambulatory Visit: Payer: Self-pay | Admitting: Primary Care

## 2020-05-12 ENCOUNTER — Telehealth: Payer: Self-pay

## 2020-05-12 DIAGNOSIS — I10 Essential (primary) hypertension: Secondary | ICD-10-CM | POA: Diagnosis not present

## 2020-05-12 DIAGNOSIS — Z794 Long term (current) use of insulin: Secondary | ICD-10-CM | POA: Diagnosis not present

## 2020-05-12 DIAGNOSIS — E1129 Type 2 diabetes mellitus with other diabetic kidney complication: Secondary | ICD-10-CM | POA: Diagnosis not present

## 2020-05-12 DIAGNOSIS — R001 Bradycardia, unspecified: Secondary | ICD-10-CM | POA: Diagnosis not present

## 2020-05-12 DIAGNOSIS — E1159 Type 2 diabetes mellitus with other circulatory complications: Secondary | ICD-10-CM | POA: Diagnosis not present

## 2020-05-12 DIAGNOSIS — R809 Proteinuria, unspecified: Secondary | ICD-10-CM | POA: Diagnosis not present

## 2020-05-12 DIAGNOSIS — E1142 Type 2 diabetes mellitus with diabetic polyneuropathy: Secondary | ICD-10-CM | POA: Diagnosis not present

## 2020-05-12 DIAGNOSIS — F17200 Nicotine dependence, unspecified, uncomplicated: Secondary | ICD-10-CM | POA: Diagnosis not present

## 2020-05-12 NOTE — Progress Notes (Addendum)
Chronic Care Management Pharmacy Assistant   Name: Antonio [name]  MRN: [id number] DOB: December 26, 1945  Reason for Encounter: Disease State   BP Log   Recent office visits:  None since last CCM contact Recent consult visits:  None since last CCM visit   Hospital visits:  None in previous 6 months  Medications: Outpatient Encounter Medications as of 05/12/2020  Medication Sig Note   acetaminophen (TYLENOL) 500 MG tablet Take 1,000 mg by mouth every 6 (six) hours. Rapid release    amLODipine (NORVASC) 5 MG tablet Take 5 mg by mouth daily.    aspirin 81 MG tablet Take 81 mg by mouth daily.    atorvastatin (LIPITOR) 20 MG tablet TAKE 1 TABLET BY MOUTH EVERY EVENING FOR CHOLESTEROL    buPROPion (WELLBUTRIN SR) 150 MG 12 hr tablet One tab daily for 5 days, twice a day starting on day 6. (Patient not taking: No sig reported) 02/16/2020: Have not started   ELIQUIS 5 MG TABS tablet TAKE 1 TABLET BY MOUTH TWICE A DAY    ferrous gluconate (FERGON) 324 MG tablet Take 1 tablet (324 mg total) by mouth daily with breakfast. (Patient not taking: No sig reported)    finasteride (PROSCAR) 5 MG tablet TAKE 1 TABLET BY MOUTH EVERY DAY    glipiZIDE (GLUCOTROL) 10 MG tablet Take 10 mg by mouth daily before supper.     GLUCOSAMINE HCL PO Take 1 tablet by mouth daily.    insulin detemir (LEVEMIR) 100 UNIT/ML injection Inject 20 units at bedtime. (Patient taking differently: Inject 15 Units into the skin daily.) 04/08/2020: Took half a dose    lisinopril-hydrochlorothiazide (ZESTORETIC) 20-25 MG tablet TAKE 1 TABLET BY MOUTH EVERY DAY FOR BLOOD PRESSURE    metFORMIN (GLUCOPHAGE) 500 MG tablet TAKE 2 TABLETS BY MOUTH TWICE A DAY WITH MEAL FOR DIABETES    metoprolol tartrate (LOPRESSOR) 50 MG tablet TAKE 1/2 TABLET BY MOUTH TWICE A DAY for blood pressure.    pantoprazole (PROTONIX) 40 MG tablet Take 1 tablet (40 mg total) by mouth daily. For heartburn.    tamsulosin (FLOMAX) 0.4 MG CAPS capsule Take 1 capsule (0.4  mg total) by mouth 2 (two) times daily.    triamcinolone cream (KENALOG) 0.5 % Apply 1 application topically 2 (two) times daily.    vitamin B-12 (CYANOCOBALAMIN) 1000 MCG tablet Take 1,000 mcg by mouth daily.    No facility-administered encounter medications on file as of 05/12/2020.    Recent Office Vitals: BP Readings from Last 3 Encounters:  04/08/20 (!) 163/39  01/20/20 (!) 152/60  01/12/20 122/80   Pulse Readings from Last 3 Encounters:  04/08/20 (!) 57  01/20/20 79  01/12/20 69    Wt Readings from Last 3 Encounters:  04/01/20 163 lb (73.9 kg)  01/20/20 166 lb (75.3 kg)  01/12/20 160 lb 3 oz (72.7 kg)     Kidney Function Lab Results  Component Value Date/Time   CREATININE 1.19 12/02/2019 08:53 AM   CREATININE 1.13 09/24/2018 08:19 AM   GFR 59.99 (L) 12/02/2019 08:53 AM   GFRNONAA >60 05/31/2018 05:29 AM   GFRAA >60 05/31/2018 05:29 AM    BMP Latest Ref Rng & Units 12/02/2019 09/24/2018 05/31/2018  Glucose 70 - 99 mg/dL 74 154(H) 195(H)  BUN 6 - 23 mg/dL 26(H) 30(H) 17  Creatinine 0.40 - 1.50 mg/dL 1.19 1.13 0.91  BUN/Creat Ratio 10 - 22 - - -  Sodium 135 - 145 mEq/L 142 139 136  Potassium 3.5 -  5.1 mEq/L 5.2(H) 4.3 3.9  Chloride 96 - 112 mEq/L 113(H) 104 103  CO2 19 - 32 mEq/L 21 25 26   Calcium 8.4 - 10.5 mg/dL 8.9 9.5 8.4(L)   Current antihypertensive regimen:  Lisinopril/HCTZ 20/25 mg - 1 tablet daily - (resumed 03/25/20)  Metoprolol tartrate 50 mg - 1/2 tablet twice daily Amlodipine 5 mg - 1 tablet daily   Patient verbally confirms he is taking the above medications as directed. Yes  How often are you checking your Blood Pressure? daily with arm cuff  he checks his blood pressure in the morning before taking his medication.  Current home BP readings:   DATE:             BP                PULSE 04/29/2020  156/64  55 05/01/2020  178/58  54 05/03/2020  153/68  53 05/05/2020  159/69  60 05/07/2020  173/63  61 05/10/2020  156/44  53 05/11/2020  124/41  50 05/12/2020  140/60  -   Caffeine intake: N/A Salt intake:  Uses salt substitute . OTC medications including pseudoephedrine or NSAIDs? no  Any readings above 180/120? No If yes any symptoms of hypertensive emergency? patient denies any symptoms of high blood pressure   What recent interventions/DTPs have been made by any provider to improve Blood Pressure control since last CPP Visit:        Recommend checking home BP for the next 2 weeks  Any recent hospitalizations or ED visits since last visit with CPP? No  What diet changes have been made to improve Blood Pressure Control?  None identified  What exercise is being done to improve your Blood Pressure Control?  None identified  Adherence Review: Is the patient currently on ACE/ARB medication? Yes Does the patient have >5 day gap between last estimated fill dates? CPP to review   Star Rating Drugs:  Medication:  Last Fill: Day Supply Atorvastatin 20mg . 04/26/2020 90ds Gli[izide 10 mg. 04/27/2020 90ds Lisinopril-hydro. 20-25mg  03/31/2020 90ds metformin 500mg  03/30/2020 90ds   Follow-Up:  Pharmacist Review  [name], CPP notified  Avel Sensor, [place] Assistant [phone]  I have reviewed the care management and care coordination activities outlined in this encounter and I am certifying that I agree with the content of this note. Would like CCM visit to discuss in May 2022.  [name], PharmD Clinical Pharmacist [facility] at [facility] [phone]

## 2020-05-17 ENCOUNTER — Other Ambulatory Visit (INDEPENDENT_AMBULATORY_CARE_PROVIDER_SITE_OTHER): Payer: Self-pay | Admitting: Vascular Surgery

## 2020-05-17 DIAGNOSIS — I739 Peripheral vascular disease, unspecified: Secondary | ICD-10-CM

## 2020-05-18 ENCOUNTER — Other Ambulatory Visit: Payer: Self-pay

## 2020-05-18 ENCOUNTER — Ambulatory Visit (INDEPENDENT_AMBULATORY_CARE_PROVIDER_SITE_OTHER): Payer: Medicare Other

## 2020-05-18 ENCOUNTER — Ambulatory Visit (INDEPENDENT_AMBULATORY_CARE_PROVIDER_SITE_OTHER): Payer: Medicare Other | Admitting: Vascular Surgery

## 2020-05-18 ENCOUNTER — Encounter (INDEPENDENT_AMBULATORY_CARE_PROVIDER_SITE_OTHER): Payer: Self-pay | Admitting: Vascular Surgery

## 2020-05-18 VITALS — BP 124/56 | HR 71 | Resp 16 | Wt 163.0 lb

## 2020-05-18 DIAGNOSIS — Z716 Tobacco abuse counseling: Secondary | ICD-10-CM | POA: Diagnosis not present

## 2020-05-18 DIAGNOSIS — Z720 Tobacco use: Secondary | ICD-10-CM

## 2020-05-18 DIAGNOSIS — I739 Peripheral vascular disease, unspecified: Secondary | ICD-10-CM

## 2020-05-18 DIAGNOSIS — I10 Essential (primary) hypertension: Secondary | ICD-10-CM

## 2020-05-18 DIAGNOSIS — Z794 Long term (current) use of insulin: Secondary | ICD-10-CM | POA: Diagnosis not present

## 2020-05-18 DIAGNOSIS — E7800 Pure hypercholesterolemia, unspecified: Secondary | ICD-10-CM | POA: Diagnosis not present

## 2020-05-18 DIAGNOSIS — E11618 Type 2 diabetes mellitus with other diabetic arthropathy: Secondary | ICD-10-CM | POA: Diagnosis not present

## 2020-05-18 NOTE — Progress Notes (Signed)
MRN : [id number]  Antonio [name] is a 75 y.o. (Jan 25, 1946) male who presents with chief complaint of  Chief Complaint  Patient presents with  . Follow-up    Ultrasound follow up  .  History of Present Illness: Patient returns today in follow up of his PAD.  About 2 years ago he underwent extensive left lower extremity revascularization for limb salvage.  He is doing well.  He does have some mild to moderate claudication symptoms worse on the left than the right.  He also has severe arthritic symptoms of the lower extremities in both legs.  No new ulceration or infection.  No rest pain.  ABIs today show a right ABI of 0.93 and a left ABI 0.86 although his waveforms are monophasic there is stronger on the right than the left.  His digit pressures in the normal range on the right but reduced at 56 on the left.  He does continue to smoke although he has cut back from where he was originally.  Current Outpatient Medications  Medication Sig Dispense Refill  . acetaminophen (TYLENOL) 500 MG tablet Take 1,000 mg by mouth every 6 (six) hours. Rapid release    . aspirin 81 MG tablet Take 81 mg by mouth daily.    [name] atorvastatin (LIPITOR) 20 MG tablet TAKE 1 TABLET BY MOUTH EVERY EVENING FOR CHOLESTEROL 90 tablet 1  . ELIQUIS 5 MG TABS tablet TAKE 1 TABLET BY MOUTH TWICE A DAY 60 tablet 4  . finasteride (PROSCAR) 5 MG tablet TAKE 1 TABLET BY MOUTH EVERY DAY 90 tablet 3  . glipiZIDE (GLUCOTROL) 10 MG tablet Take 10 mg by mouth daily before supper.     [name] GLUCOSAMINE HCL PO Take 1 tablet by mouth daily.    . insulin detemir (LEVEMIR) 100 UNIT/ML injection Inject 20 units at bedtime. (Patient taking differently: Inject 15 Units into the skin daily.) 10 pen 5  . lisinopril-hydrochlorothiazide (ZESTORETIC) 20-25 MG tablet TAKE 1 TABLET BY MOUTH EVERY DAY FOR BLOOD PRESSURE 90 tablet 1  . metFORMIN (GLUCOPHAGE) 500 MG tablet TAKE 2 TABLETS BY MOUTH TWICE A DAY WITH MEAL FOR DIABETES 360 tablet 2  .  metoprolol tartrate (LOPRESSOR) 50 MG tablet TAKE 1/2 TABLET BY MOUTH TWICE A DAY for blood pressure. 90 tablet 1  . pantoprazole (PROTONIX) 40 MG tablet Take 1 tablet (40 mg total) by mouth daily. For heartburn. 90 tablet 3  . tamsulosin (FLOMAX) 0.4 MG CAPS capsule Take 1 capsule (0.4 mg total) by mouth 2 (two) times daily. 180 capsule 3  . triamcinolone cream (KENALOG) 0.5 % Apply 1 application topically 2 (two) times daily.    . vitamin B-12 (CYANOCOBALAMIN) 1000 MCG tablet Take 1,000 mcg by mouth daily.    [name] amLODipine (NORVASC) 5 MG tablet Take 5 mg by mouth daily.    [name] buPROPion (WELLBUTRIN SR) 150 MG 12 hr tablet One tab daily for 5 days, twice a day starting on day 6. (Patient not taking: No sig reported) 60 tablet 1  . ferrous gluconate (FERGON) 324 MG tablet Take 1 tablet (324 mg total) by mouth daily with breakfast. (Patient not taking: No sig reported) 30 tablet 3   No current facility-administered medications for this visit.    Past Medical History:  Diagnosis Date  . Arthritis   . Asthma    age 48  . 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 Coronary atherosclerosis of unspecified type of vessel, native  or graft   . Diabetes mellitus    Type II  . Emphysema of lung ([place])   . Esophageal stricture   . Gastritis   . GERD (gastroesophageal reflux disease)   . Hiatal hernia   . Hx of colonic polyps   . Hyperlipidemia   . Hypertension   . Internal hemorrhoids without mention of complication   . Lumbago   . Pain in both lower legs   . PSA elevation    now averaging 2's  . Tobacco abuse   . URI (upper respiratory infection)   . Urinary retention     Past Surgical History:  Procedure Laterality Date  . 2D Echo  06/03  . Abdominal ultrasound  06/03   Negative  . BACK SURGERY    . COLONOSCOPY    . CT of head  and sinuses  06/03   Negative  . CT of the chest, abdomen, pelvis  06/03   Chest negative; Abdomen, left adrenal lesion; Pelvis,  enlarged prostate  . ESOPHAGOGASTRODUODENOSCOPY  02/13/07   gastritis and duodenitis without bleed  . ESOPHAGOGASTRODUODENOSCOPY N/A 06/26/2014   Procedure: ESOPHAGOGASTRODUODENOSCOPY (EGD);  Surgeon: Milus Banister, MD;  Location: [facility];  Service: Endoscopy;  Laterality: N/A;  . ESOPHAGOGASTRODUODENOSCOPY (EGD) WITH PROPOFOL N/A 04/08/2020   Procedure: ESOPHAGOGASTRODUODENOSCOPY (EGD) WITH PROPOFOL;  Surgeon: Ladene Artist, MD;  [facility];  Service: Endoscopy;  Laterality: N/A;  . HOT HEMOSTASIS N/A 04/08/2020   Procedure: HOT HEMOSTASIS (ARGON PLASMA COAGULATION/BICAP);  Surgeon: Ladene Artist, MD;  Location: [facility];  Service: Endoscopy;  Laterality: N/A;  . KNEE ARTHROSCOPY  10/00   Right  . LOWER EXTREMITY ANGIOGRAPHY Right 12/27/2017   Procedure: LOWER EXTREMITY ANGIOGRAPHY;  Surgeon: Algernon Huxley, MD;  Location: [facility];  Service: Cardiovascular;  Laterality: Right;  . LOWER EXTREMITY ANGIOGRAPHY Left 01/10/2018   Procedure: LOWER EXTREMITY ANGIOGRAPHY;  Surgeon: Algernon Huxley, MD;  Location: [facility];  Service: Cardiovascular;  Laterality: Left;  . LOWER EXTREMITY ANGIOGRAPHY Left 05/29/2018   Procedure: LOWER EXTREMITY ANGIOGRAPHY;  Surgeon: Algernon Huxley, MD;  Location: [facility];  Service: Cardiovascular;  Laterality: Left;  . LOWER EXTREMITY ANGIOGRAPHY Left 05/30/2018   Procedure: Lower Extremity Angiography;  Surgeon: Algernon Huxley, MD;  Location: [facility];  Service: Cardiovascular;  Laterality: Left;  . LP  06/03  . Microwave thermotherapy prostate  08/28/07   Rogers Blocker  . Persantine cardiolite  03/01   EF 68%  . POLYPECTOMY  11/95  . Stress myoview  07/06   EF 67%  . UPPER GASTROINTESTINAL ENDOSCOPY       Social History   Tobacco Use  . Smoking status: Current Every Day Smoker    Packs/day: 1.50    Years: 57.00    Pack years: 85.50    Types: Cigarettes  . Smokeless tobacco: Former Systems developer    Types: Chew   . Tobacco comment: ready to quit wants to talk to [facility] at appointment  Vaping Use  . Vaping Use: Never used  Substance Use Topics  . Alcohol use: Not Currently    Alcohol/week: 0.0 standard drinks    Comment: rare  . Drug use: No      Family History  Problem Relation Age of Onset  . Diabetes Father   . Alcohol abuse Paternal Uncle   . Alcohol abuse Paternal Uncle   . Alcohol abuse Paternal Uncle   . Rheum arthritis Mother   .  Heart disease Neg Hx   . Stroke Neg Hx   . Cancer Neg Hx   . Drug abuse Neg Hx   . Depression Neg Hx   . Colon cancer Neg Hx   . Rectal cancer Neg Hx   . Stomach cancer Neg Hx   . Kidney cancer Neg Hx   . Bladder Cancer Neg Hx   . Prostate cancer Neg Hx      No Known Allergies  REVIEW OF SYSTEMS(Negative unless checked)  Constitutional: [] ??Weight loss[] ??Fever[] ??Chills Cardiac:[] ??Chest pain[] ??Chest pressure[] ??Palpitations [] ??Shortness of breath when laying flat [] ??Shortness of breath at rest [] ??Shortness of breath with exertion. Vascular: [x] ??Pain in legs with walking[] ??Pain in legsat rest[] ??Pain in legs when laying flat [x] ??Claudication [x] ??Pain in feet when walking [] ??Pain in feet at rest [] ??Pain in feet when laying flat [] ??History of DVT [] ??Phlebitis [] ??Swelling in legs [] ??Varicose veins [] ??Non-healing ulcers Pulmonary: [] ??Uses home oxygen [] ??Productive cough[] ??Hemoptysis [] ??Wheeze [] ??COPD [] ??Asthma Neurologic: [] ??Dizziness [] ??Blackouts [] ??Seizures [] ??History of stroke [] ??History of TIA[] ??Aphasia [] ??Temporary blindness[] ??Dysphagia [] ??Weaknessor numbness in arms [] ??Weakness or numbnessin legs Musculoskeletal: [x] ??Arthritis [] ??Joint swelling [x] ??Joint pain [] ??Low back pain Hematologic:[] ??Easy bruising[] ??Easy bleeding [] ??Hypercoagulable state [] ??Anemic [] ??Hepatitis Gastrointestinal:[] ??Blood in  stool[] ??Vomiting blood[] ??Gastroesophageal reflux/heartburn[] ??Abdominal pain Genitourinary: [] ??Chronic kidney disease [] ??Difficulturination [] ??Frequenturination [] ??Burning with urination[] ??Hematuria Skin: [] ??Rashes [] ??Ulcers [] ??Wounds Psychological: [] ??History of anxiety[] ??History of major depression.  Physical Examination  BP (!) 124/56 (BP Location: Right Arm)   Pulse 71   Resp 16   Wt 163 lb (73.9 kg)   BMI 26.31 kg/m  Gen:  WD/WN, NAD Head: [place]/AT, No temporalis wasting. Ear/Nose/Throat: Hearing grossly intact, nares w/o erythema or drainage Eyes: Conjunctiva clear. Sclera non-icteric Neck: Supple.  Trachea midline Pulmonary:  Good air movement, no use of accessory muscles.  Cardiac: RRR, no JVD Vascular:  Vessel Right Left  Radial Palpable Palpable                          PT  1+ palpable  1+ palpable  DP  2+ palpable  1+ palpable   Gastrointestinal: soft, non-tender/non-distended. No guarding/reflex.  Musculoskeletal: M/S 5/5 throughout.  No deformity or atrophy.  Walks with a cane.  No significant lower extremity edema. Neurologic: Sensation grossly intact in extremities.  Symmetrical.  Speech is fluent.  Psychiatric: Judgment intact, Mood & affect appropriate for pt's clinical situation. Dermatologic: No rashes or ulcers noted.  No cellulitis or open wounds.       Labs Recent Results (from the past 2160 hour(s))  SARS CORONAVIRUS 2 (TAT 6-24 HRS) Nasopharyngeal Nasopharyngeal Swab     Status: None   Collection Time: 02/19/20  9:01 AM   Specimen: Nasopharyngeal Swab  Result Value Ref Range   SARS Coronavirus 2 NEGATIVE NEGATIVE    Comment: (NOTE) SARS-CoV-2 target nucleic acids are NOT DETECTED.  The SARS-CoV-2 RNA is generally detectable in upper and lower respiratory specimens during the acute phase of infection. Negative results do not preclude SARS-CoV-2 infection, do not rule out co-infections with other  pathogens, and should not be used as the sole basis for treatment or other patient management decisions. Negative results must be combined with clinical observations, patient history, and epidemiological information. The expected result is Negative.  Fact Sheet for Patients: SugarRoll.be  Fact Sheet for Healthcare Providers: https://www.woods-mathews.com/  This test is not yet approved or cleared by the [place] FDA and  has been authorized for detection and/or diagnosis of SARS-CoV-2 by FDA under an Emergency Use Authorization (EUA). This EUA will remain  in effect (meaning  this test can be used) for the duration of the COVID-19 declaration under Se ction 564(b)(1) of the Act, 21 U.S.C. section 360bbb-3(b)(1), unless the authorization is terminated or revoked sooner.  Performed at [facility], [place] [address] [place], [place] [address]   SARS CORONAVIRUS 2 (TAT 6-24 HRS) Nasopharyngeal Nasopharyngeal Swab     Status: None   Collection Time: 04/05/20  9:16 AM   Specimen: Nasopharyngeal Swab  Result Value Ref Range   SARS Coronavirus 2 NEGATIVE NEGATIVE    Comment: (NOTE) SARS-CoV-2 target nucleic acids are NOT DETECTED.  The SARS-CoV-2 RNA is generally detectable in upper and lower respiratory specimens during the acute phase of infection. Negative results do not preclude SARS-CoV-2 infection, do not rule out co-infections with other pathogens, and should not be used as the sole basis for treatment or other patient management decisions. Negative results must be combined with clinical observations, patient history, and epidemiological information. The expected result is Negative.  Fact Sheet for Patients: SugarRoll.be  Fact Sheet for Healthcare Providers: https://www.woods-mathews.com/  This test is not yet approved or cleared by the [place] FDA and  has been authorized for  detection and/or diagnosis of SARS-CoV-2 by FDA under an Emergency Use Authorization (EUA). This EUA will remain  in effect (meaning this test can be used) for the duration of the COVID-19 declaration under Se ction 564(b)(1) of the Act, 21 U.S.C. section 360bbb-3(b)(1), unless the authorization is terminated or revoked sooner.  Performed at [facility], [place] [address] [place], [place] [address]   Glucose, capillary     Status: Abnormal   Collection Time: 04/08/20  8:49 AM  Result Value Ref Range   Glucose-Capillary 138 (H) 70 - 99 mg/dL    Comment: Glucose reference range applies only to samples taken after fasting for at least 8 hours.    Radiology No results found.  Assessment/Plan Essential hypertension blood pressure control important in reducing the progression of atherosclerotic disease. On appropriate oral medications.   Diabetes mellitus type 2, controlled blood glucose control important in reducing the progression of atherosclerotic disease. Also, involved in wound healing. On appropriate medications.   Tobacco abuse We had a discussion for approximately87minutes regarding the absolute need for smoking cessation due to the deleterious nature of tobacco on the vascular system. We discussed the tobacco use would diminish patency of any intervention, and likely significantly worsen progressio of disease. We discussed multiple agents for quitting including replacement therapy or medications to reduce cravings such as Chantix. The patient voices their understanding of the importance of smoking cessation.  He has cut well back on his tobacco and is congratulated for this.  He is gone from 2 packs/day down to less than 1 pack/day.  He is hoping to be off tobacco sometime next month.   HLD (hyperlipidemia) lipid control important in reducing the progression of atherosclerotic disease. Continue statin therapy  PAD (peripheral artery disease) (HCC) ABIs today show  a right ABI of 0.93 and a left ABI 0.86 although his waveforms are monophasic there is stronger on the right than the left.  His digit pressures in the normal range on the right but reduced at 56 on the left.  We again discussed the importance of smoking cessation.  He will continue his current medical regimen.  Recheck in 1 year.    Leotis Pain, MD  05/18/2020 2:34 PM    This note was created with Dragon medical transcription system.  Any errors from dictation  are purely unintentional

## 2020-05-18 NOTE — Assessment & Plan Note (Signed)
ABIs today show a right ABI of 0.93 and a left ABI 0.86 although his waveforms are monophasic there is stronger on the right than the left.  His digit pressures in the normal range on the right but reduced at 56 on the left.  We again discussed the importance of smoking cessation.  He will continue his current medical regimen.  Recheck in 1 year.

## 2020-05-25 NOTE — Chronic Care Management (AMB) (Signed)
06/01/2020  I spoke with Antonio [name] and he will F/U with [name], CPP on Thursday May 19, at 1:15pm for F/U  HTN

## 2020-06-01 ENCOUNTER — Other Ambulatory Visit: Payer: Self-pay | Admitting: Gastroenterology

## 2020-06-01 DIAGNOSIS — K2990 Gastroduodenitis, unspecified, without bleeding: Secondary | ICD-10-CM

## 2020-06-03 DIAGNOSIS — E113393 Type 2 diabetes mellitus with moderate nonproliferative diabetic retinopathy without macular edema, bilateral: Secondary | ICD-10-CM | POA: Diagnosis not present

## 2020-06-03 LAB — HM DIABETES EYE EXAM

## 2020-06-08 ENCOUNTER — Encounter: Payer: Self-pay | Admitting: Primary Care

## 2020-06-14 ENCOUNTER — Ambulatory Visit (INDEPENDENT_AMBULATORY_CARE_PROVIDER_SITE_OTHER): Payer: Medicare Other | Admitting: Primary Care

## 2020-06-14 ENCOUNTER — Other Ambulatory Visit: Payer: Self-pay

## 2020-06-14 ENCOUNTER — Encounter: Payer: Self-pay | Admitting: Primary Care

## 2020-06-14 VITALS — BP 138/72 | HR 53 | Temp 97.8°F | Ht 66.0 in | Wt 163.0 lb

## 2020-06-14 DIAGNOSIS — G8929 Other chronic pain: Secondary | ICD-10-CM

## 2020-06-14 DIAGNOSIS — M25551 Pain in right hip: Secondary | ICD-10-CM

## 2020-06-14 MED ORDER — TIZANIDINE HCL 4 MG PO TABS
4.0000 mg | ORAL_TABLET | Freq: Two times a day (BID) | ORAL | 0 refills | Status: DC | PRN
Start: 2020-06-14 — End: 2020-08-04

## 2020-06-14 MED ORDER — METHYLPREDNISOLONE ACETATE 80 MG/ML IJ SUSP
80.0000 mg | Freq: Once | INTRAMUSCULAR | Status: AC
  Administered 2020-06-14: 80 mg via INTRAMUSCULAR

## 2020-06-14 NOTE — Progress Notes (Signed)
Subjective:    Patient ID: Antonio [name], male    DOB: 10-14-1945, 75 y.o.   MRN: [id number]  HPI  Antonio [name] is a very pleasant 75 y.o. male with a history of PAD, hypertension, CAD, tobacco abuse, hyperlipidemia, chronic right hip pain who presents today to discuss acute on chronic right hip pain.  He notices increased pain with weather changes, specifically rainy and colder weather. Several weeks ago be began to notice increased symptoms. Historically he takes Tylenol Extra Strength every 6 hours with temporary improvement. He does notice intermittent numbness/tinlging down the right lower extremity.   Evaluated by his vascular provider in mid April 2022, ABI's with 0.93 to right and 0.86 to left. No changes made to medical regimen, he continues to smoke cigarettes.   He's undergone xrays of the right hip in September 2021 and again in December 2021; degenerative changes ned to both hips and lumbar spine.   He denies back pain, loss of bowel/bladder control.   BP Readings from Last 3 Encounters:  06/14/20 138/72  05/18/20 (!) 124/56  04/08/20 (!) 163/39      Review of Systems  Musculoskeletal: Positive for arthralgias.  Skin: Negative for color change.  Neurological: Positive for numbness.         Past Medical History:  Diagnosis Date  . Arthritis   . Asthma    age 1  . 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 Coronary atherosclerosis of unspecified type of vessel, native or graft   . Diabetes mellitus    Type II  . Emphysema of lung ([place])   . Esophageal stricture   . Gastritis   . GERD (gastroesophageal reflux disease)   . Hiatal hernia   . Hx of colonic polyps   . Hyperlipidemia   . Hypertension   . Internal hemorrhoids without mention of complication   . Lumbago   . Pain in both lower legs   . PSA elevation    now averaging 2's  . Tobacco abuse   . URI (upper respiratory infection)   . Urinary retention     Social History    Socioeconomic History  . Marital status: Married    Spouse name: [name]  . Number of children: 3  . Years of education: Not on file  . Highest education level: Not on file  Occupational History  . Occupation: Retired Games developer  Tobacco Use  . Smoking status: Current Every Day Smoker    Packs/day: 1.50    Years: 57.00    Pack years: 85.50    Types: Cigarettes  . Smokeless tobacco: Former Systems developer    Types: Chew  . Tobacco comment: ready to quit wants to talk to [facility] at appointment  Vaping Use  . Vaping Use: Never used  Substance and Sexual Activity  . Alcohol use: Not Currently    Alcohol/week: 0.0 standard drinks    Comment: rare  . Drug use: No  . Sexual activity: Yes  Other Topics Concern  . Not on file  Social History Narrative   Married 9/09 wife with moderate memory problems.   3 adult children, 2 grandchildren   Would desire CPR   Social Determinants of Health   Financial Resource Strain: Not on file  Food Insecurity: Not on file  Transportation Needs: Not on file  Physical Activity: Not on file  Stress: Not on file  Social Connections: Not on file  Intimate Partner Violence: Not on file  Past Surgical History:  Procedure Laterality Date  . 2D Echo  06/03  . Abdominal ultrasound  06/03   Negative  . BACK SURGERY    . COLONOSCOPY    . CT of head  and sinuses  06/03   Negative  . CT of the chest, abdomen, pelvis  06/03   Chest negative; Abdomen, left adrenal lesion; Pelvis, enlarged prostate  . ESOPHAGOGASTRODUODENOSCOPY  02/13/07   gastritis and duodenitis without bleed  . ESOPHAGOGASTRODUODENOSCOPY N/A 06/26/2014   Procedure: ESOPHAGOGASTRODUODENOSCOPY (EGD);  Surgeon: Milus Banister, MD;  Location: [facility];  Service: Endoscopy;  Laterality: N/A;  . ESOPHAGOGASTRODUODENOSCOPY (EGD) WITH PROPOFOL N/A 04/08/2020   Procedure: ESOPHAGOGASTRODUODENOSCOPY (EGD) WITH PROPOFOL;  Surgeon: Ladene Artist, MD;  [facility];  Service: Endoscopy;   Laterality: N/A;  . HOT HEMOSTASIS N/A 04/08/2020   Procedure: HOT HEMOSTASIS (ARGON PLASMA COAGULATION/BICAP);  Surgeon: Ladene Artist, MD;  Location: [facility];  Service: Endoscopy;  Laterality: N/A;  . KNEE ARTHROSCOPY  10/00   Right  . LOWER EXTREMITY ANGIOGRAPHY Right 12/27/2017   Procedure: LOWER EXTREMITY ANGIOGRAPHY;  Surgeon: Algernon Huxley, MD;  Location: [facility];  Service: Cardiovascular;  Laterality: Right;  . LOWER EXTREMITY ANGIOGRAPHY Left 01/10/2018   Procedure: LOWER EXTREMITY ANGIOGRAPHY;  Surgeon: Algernon Huxley, MD;  Location: [facility];  Service: Cardiovascular;  Laterality: Left;  . LOWER EXTREMITY ANGIOGRAPHY Left 05/29/2018   Procedure: LOWER EXTREMITY ANGIOGRAPHY;  Surgeon: Algernon Huxley, MD;  Location: [facility];  Service: Cardiovascular;  Laterality: Left;  . LOWER EXTREMITY ANGIOGRAPHY Left 05/30/2018   Procedure: Lower Extremity Angiography;  Surgeon: Algernon Huxley, MD;  Location: [facility];  Service: Cardiovascular;  Laterality: Left;  . LP  06/03  . Microwave thermotherapy prostate  08/28/07   Rogers Blocker  . Persantine cardiolite  03/01   EF 68%  . POLYPECTOMY  11/95  . Stress myoview  07/06   EF 67%  . UPPER GASTROINTESTINAL ENDOSCOPY      Family History  Problem Relation Age of Onset  . Diabetes Father   . Alcohol abuse Paternal Uncle   . Alcohol abuse Paternal Uncle   . Alcohol abuse Paternal Uncle   . Rheum arthritis Mother   . Heart disease Neg Hx   . Stroke Neg Hx   . Cancer Neg Hx   . Drug abuse Neg Hx   . Depression Neg Hx   . Colon cancer Neg Hx   . Rectal cancer Neg Hx   . Stomach cancer Neg Hx   . Kidney cancer Neg Hx   . Bladder Cancer Neg Hx   . Prostate cancer Neg Hx     No Known Allergies  Current Outpatient Medications on File Prior to Visit  Medication Sig Dispense Refill  . acetaminophen (TYLENOL) 500 MG tablet Take 1,000 mg by mouth every 6 (six) hours. Rapid release    . aspirin 81 MG  tablet Take 81 mg by mouth daily.    [name] atorvastatin (LIPITOR) 20 MG tablet TAKE 1 TABLET BY MOUTH EVERY EVENING FOR CHOLESTEROL 90 tablet 1  . buPROPion (WELLBUTRIN SR) 150 MG 12 hr tablet One tab daily for 5 days, twice a day starting on day 6. 60 tablet 1  . ELIQUIS 5 MG TABS tablet TAKE 1 TABLET BY MOUTH TWICE A DAY 60 tablet 4  . ferrous gluconate (FERGON) 324 MG tablet TAKE 1 TABLET BY MOUTH DAILY WITH BREAKFAST 30 tablet  3  . finasteride (PROSCAR) 5 MG tablet TAKE 1 TABLET BY MOUTH EVERY DAY 90 tablet 3  . glipiZIDE (GLUCOTROL) 10 MG tablet Take 10 mg by mouth daily before supper.     [name] GLUCOSAMINE HCL PO Take 1 tablet by mouth daily.    . insulin detemir (LEVEMIR) 100 UNIT/ML injection Inject 20 units at bedtime. (Patient taking differently: Inject 15 Units into the skin daily.) 10 pen 5  . lisinopril-hydrochlorothiazide (ZESTORETIC) 20-25 MG tablet TAKE 1 TABLET BY MOUTH EVERY DAY FOR BLOOD PRESSURE 90 tablet 1  . metFORMIN (GLUCOPHAGE) 500 MG tablet TAKE 2 TABLETS BY MOUTH TWICE A DAY WITH MEAL FOR DIABETES 360 tablet 2  . metoprolol tartrate (LOPRESSOR) 50 MG tablet TAKE 1/2 TABLET BY MOUTH TWICE A DAY for blood pressure. 90 tablet 1  . pantoprazole (PROTONIX) 40 MG tablet Take 1 tablet (40 mg total) by mouth daily. For heartburn. 90 tablet 3  . tamsulosin (FLOMAX) 0.4 MG CAPS capsule Take 1 capsule (0.4 mg total) by mouth 2 (two) times daily. 180 capsule 3  . triamcinolone cream (KENALOG) 0.5 % Apply 1 application topically 2 (two) times daily.    . vitamin B-12 (CYANOCOBALAMIN) 1000 MCG tablet Take 1,000 mcg by mouth daily.    [name] amLODipine (NORVASC) 5 MG tablet Take 5 mg by mouth daily.     No current facility-administered medications on file prior to visit.    BP 138/72   Pulse (!) 53   Temp 97.8 F (36.6 C) (Temporal)   Ht 5\' 6"  (1.676 m)   Wt 163 lb (73.9 kg)   SpO2 98%   BMI 26.31 kg/m  Objective:   Physical Exam Cardiovascular:     Rate and Rhythm: Normal rate and  regular rhythm.  Musculoskeletal:     Comments: Walking with cane, slight limp to right lower extremity. Overall good ROM with flexion and extension exercises today.   Skin:    General: Skin is warm and dry.  Neurological:     Mental Status: He is alert.           Assessment & Plan:      This visit occurred during the SARS-CoV-2 public health emergency.  Safety protocols were in place, including screening questions prior to the visit, additional usage of staff PPE, and extensive cleaning of exam room while observing appropriate contact time as indicated for disinfecting solutions.

## 2020-06-14 NOTE — Assessment & Plan Note (Signed)
Acute on chronic flare, worse with the recent rainy and cooler weather.   Exam today overall stable. Cannot take oral NSAID's due to Eliquis use.  Continue Tylenol PRN. IM Depo Medrol 80 mg provided for joint inflammation.  Rx for Tizanidine 4 mg provided to use PRN, discussed drowsiness side effect.   I encouraged him to remain active during the day to prevent further stiffness. He will update.

## 2020-06-14 NOTE — Addendum Note (Signed)
Addended by: Dalia Heading R on: 06/14/2020 03:52 PM   Modules accepted: Orders

## 2020-06-14 NOTE — Patient Instructions (Signed)
You may take the tizanidine 4 mg muscle relaxer as needed for hip pain and muscle spasms. This medication may cause drowsiness so be careful.  Be sure to remain active during the day to prevent stiffness.   Please update me in a week if no improvement.   It was a pleasure to see you today!

## 2020-06-17 ENCOUNTER — Telehealth: Payer: Self-pay

## 2020-06-17 NOTE — Chronic Care Management (AMB) (Addendum)
    Chronic Care Management Pharmacy Assistant   Name: Antonio [name]  MRN: [id number] DOB: [birth year]  Reason for Encounter: Reminder for CCM Appointment 06/24/20   Medications: Outpatient Encounter Medications as of 06/17/2020  Medication Sig Note   acetaminophen (TYLENOL) 500 MG tablet Take 1,000 mg by mouth every 6 (six) hours. Rapid release    amLODipine (NORVASC) 5 MG tablet Take 5 mg by mouth daily.    aspirin 81 MG tablet Take 81 mg by mouth daily.    atorvastatin (LIPITOR) 20 MG tablet TAKE 1 TABLET BY MOUTH EVERY EVENING FOR CHOLESTEROL    buPROPion (WELLBUTRIN SR) 150 MG 12 hr tablet One tab daily for 5 days, twice a day starting on day 6. 02/16/2020: Have not started   ELIQUIS 5 MG TABS tablet TAKE 1 TABLET BY MOUTH TWICE A DAY    ferrous gluconate (FERGON) 324 MG tablet TAKE 1 TABLET BY MOUTH DAILY WITH BREAKFAST    finasteride (PROSCAR) 5 MG tablet TAKE 1 TABLET BY MOUTH EVERY DAY    glipiZIDE (GLUCOTROL) 10 MG tablet Take 10 mg by mouth daily before supper.     GLUCOSAMINE HCL PO Take 1 tablet by mouth daily.    insulin detemir (LEVEMIR) 100 UNIT/ML injection Inject 20 units at bedtime. (Patient taking differently: Inject 15 Units into the skin daily.) 04/08/2020: Took half a dose    lisinopril-hydrochlorothiazide (ZESTORETIC) 20-25 MG tablet TAKE 1 TABLET BY MOUTH EVERY DAY FOR BLOOD PRESSURE    metFORMIN (GLUCOPHAGE) 500 MG tablet TAKE 2 TABLETS BY MOUTH TWICE A DAY WITH MEAL FOR DIABETES    metoprolol tartrate (LOPRESSOR) 50 MG tablet TAKE 1/2 TABLET BY MOUTH TWICE A DAY for blood pressure.    pantoprazole (PROTONIX) 40 MG tablet Take 1 tablet (40 mg total) by mouth daily. For heartburn.    tamsulosin (FLOMAX) 0.4 MG CAPS capsule Take 1 capsule (0.4 mg total) by mouth 2 (two) times daily.    tiZANidine (ZANAFLEX) 4 MG tablet Take 1 tablet (4 mg total) by mouth 2 (two) times daily as needed for muscle spasms.    triamcinolone cream (KENALOG) 0.5 % Apply 1 application topically  2 (two) times daily.    vitamin B-12 (CYANOCOBALAMIN) 1000 MCG tablet Take 1,000 mcg by mouth daily.    No facility-administered encounter medications on file as of 06/17/2020.   [name] was contacted to remind him of his upcoming telephone visit with [name] on 06/24/20 at 1:15 PM.  He was reminded to have all medications, supplements and any blood glucose and blood pressure readings available for review at appointment.   Are you having any problems with your medications? No  What concerns would you like to discuss with the pharmacist? No    Star Rating Drugs: Medication:  Last Fill: Day Supply Atorvastatin 20 mg 06/03/20 No additional data available Lisinopril-hctz 20-25 mg  06/03/20 No additional data available  [name], CPP notified  Margaretmary Dys, [place] [phone]  I have reviewed the care management and care coordination activities outlined in this encounter and I am certifying that I agree with the content of this note. No further action required.  [name], PharmD Clinical Pharmacist [facility] [phone]

## 2020-06-23 ENCOUNTER — Other Ambulatory Visit: Payer: Self-pay | Admitting: Primary Care

## 2020-06-23 DIAGNOSIS — M25551 Pain in right hip: Secondary | ICD-10-CM

## 2020-06-23 DIAGNOSIS — G8929 Other chronic pain: Secondary | ICD-10-CM

## 2020-06-24 ENCOUNTER — Other Ambulatory Visit: Payer: Self-pay

## 2020-06-24 ENCOUNTER — Ambulatory Visit (INDEPENDENT_AMBULATORY_CARE_PROVIDER_SITE_OTHER): Payer: Medicare Other

## 2020-06-24 DIAGNOSIS — I10 Essential (primary) hypertension: Secondary | ICD-10-CM | POA: Diagnosis not present

## 2020-06-24 DIAGNOSIS — E11618 Type 2 diabetes mellitus with other diabetic arthropathy: Secondary | ICD-10-CM

## 2020-06-24 DIAGNOSIS — Z794 Long term (current) use of insulin: Secondary | ICD-10-CM

## 2020-06-24 DIAGNOSIS — Z720 Tobacco use: Secondary | ICD-10-CM

## 2020-06-24 NOTE — Progress Notes (Signed)
Chronic Care Management Pharmacy Note  06/24/20 Name:  Antonio [name] MRN:  [id number] DOB:  1945-03-26  Subjective: Antonio [name] is an 75 y.o. year old male who is a primary patient of Pleas Koch, NP.  The CCM team was consulted for assistance with disease management and care coordination needs.    Engaged with patient by telephone for follow up visit in response to provider referral for pharmacy case management and/or care coordination services.   Consent to Services:  The patient was given information about Chronic Care Management services, agreed to services, and gave verbal consent prior to initiation of services.  Please see initial visit note for detailed documentation.   Patient Care Team: Pleas Koch, NP as PCP - General (Internal Medicine) Laneta Simmers as Physician Assistant (Urology) Ladene Artist, MD as Consulting Physician (Gastroenterology) [name], [facility] as Pharmacist (Pharmacist)  Recent office visits: 06/14/20 - PCP - Chronic right hip pain - Continue Tylenol PRN. IM Depo Medrol 80 mg provided for joint inflammation. Rx for Tizanidine 4 mg provided to use PRN, discussed drowsiness side effect.   Recent consult visits: 05/18/20 - Vascular Surgery - PAD/Ultrasound follow up, ABIs today show a right ABI of 0.93 and a left ABI 0.86. Discussed importance of tobacco cessation. Continue current medical regimen. Recheck in 1 year. 05/12/20 - Endocrinology - Continue Levemir, glipizide and metformin as prescribed. Follow up 6 months.  Hospital visits: 04/08/20 - EGD procedure for gastric AVM and iron deficiency   Objective:  Lab Results  Component Value Date   CREATININE 1.19 12/02/2019   BUN 26 (H) 12/02/2019   GFR 59.99 (L) 12/02/2019   GFRNONAA >60 05/31/2018   GFRAA >60 05/31/2018   NA 142 12/02/2019   K 5.2 (H) 12/02/2019   CALCIUM 8.9 12/02/2019   CO2 21 12/02/2019   GLUCOSE 74 12/02/2019    Lab Results  Component Value  Date/Time   HGBA1C 6.8 (A) 12/02/2019 08:32 AM   HGBA1C 7.6 (H) 09/24/2018 08:19 AM   HGBA1C 6.8 (A) 09/14/2017 08:30 AM   HGBA1C 7.6 03/16/2017 12:00 AM   HGBA1C 8.9 (H) 07/12/2016 09:43 AM   GFR 59.99 (L) 12/02/2019 08:53 AM   GFR 63.48 09/24/2018 08:19 AM   MICROALBUR 6.8 (H) 12/23/2014 08:08 AM   MICROALBUR 1.3 11/12/2007 09:24 AM    Last diabetic Eye exam:  Lab Results  Component Value Date/Time   HMDIABEYEEXA Retinopathy (A) 06/03/2020 12:00 AM    Last diabetic Foot exam:  05/12/20, no abnormalities  Lab Results  Component Value Date   CHOL 102 12/02/2019   HDL 38.50 (L) 12/02/2019   LDLCALC 37 12/02/2019   LDLDIRECT 120.3 04/24/2011   TRIG 131.0 12/02/2019   CHOLHDL 3 12/02/2019    Hepatic Function Latest Ref Rng & Units 12/02/2019 09/24/2018 06/27/2017  Total Protein 6.0 - 8.3 g/dL 6.2 6.6 6.5  Albumin 3.5 - 5.2 g/dL 4.1 4.2 3.9  AST 0 - 37 U/L 11 9 9   ALT 0 - 53 U/L 9 9 9   Alk Phosphatase 39 - 117 U/L 42 52 61  Total Bilirubin 0.2 - 1.2 mg/dL 0.3 0.4 0.4  Bilirubin, Direct 0.0 - 0.3 mg/dL - - -    Lab Results  Component Value Date/Time   TSH 1.11 09/14/2017 08:30 AM   TSH 1.32 07/12/2016 09:43 AM    CBC Latest Ref Rng & Units 12/08/2019 12/02/2019 09/24/2018  WBC 4.0 - 10.5 K/uL 9.2 9.3 10.4  Hemoglobin 13.0 -  17.0 g/dL 8.5 Repeated and verified X2.(L) 8.3 Repeated and verified X2.(L) 13.5  Hematocrit 39.0 - 52.0 % 26.1 Repeated and verified X2.(L) 26.2 Repeated and verified X2.(L) 40.3  Platelets 150.0 - 400.0 K/uL 292.0 281.0 254.0    No results found for: VD25OH  Clinical ASCVD: Yes  The ASCVD Risk score Mikey Bussing DC Jr., et al., 2013) failed to calculate for the following reasons:   The valid total cholesterol range is 130 to 320 mg/dL    Depression screen [facility] 2/9 12/02/2019 09/23/2018 09/14/2017  Decreased Interest 3 0 0  Down, Depressed, Hopeless 0 0 0  PHQ - 2 Score 3 0 0  Altered sleeping 0 3 0  Tired, decreased energy 3 3 0  Change in appetite 0 0 0   Feeling bad or failure about yourself  0 0 0  Trouble concentrating 0 0 0  Moving slowly or fidgety/restless 0 0 0  Suicidal thoughts 0 0 0  PHQ-9 Score 6 6 0  Difficult doing work/chores Not difficult at all Not difficult at all Not difficult at all  Some recent data might be hidden    Social History   Tobacco Use  Smoking Status Current Every Day Smoker  . Packs/day: 1.50  . Years: 57.00  . Pack years: 85.50  . Types: Cigarettes  Smokeless Tobacco Former Systems developer  . Types: Chew  Tobacco Comment   ready to quit wants to talk to [facility] at appointment   BP Readings from Last 3 Encounters:  06/14/20 138/72  05/18/20 (!) 124/56  04/08/20 (!) 163/39   Pulse Readings from Last 3 Encounters:  06/14/20 (!) 53  05/18/20 71  04/08/20 (!) 57   Wt Readings from Last 3 Encounters:  06/14/20 163 lb (73.9 kg)  05/18/20 163 lb (73.9 kg)  04/01/20 163 lb (73.9 kg)   BMI Readings from Last 3 Encounters:  06/14/20 26.31 kg/m  05/18/20 26.31 kg/m  04/01/20 26.31 kg/m    Assessment/Interventions: Review of patient past medical history, allergies, medications, health status, including review of consultants reports, laboratory and other test data, was performed as part of comprehensive evaluation and provision of chronic care management services.   SDOH:  (Social Determinants of Health) assessments and interventions performed: Yes SDOH Interventions   Flowsheet Row Most Recent Value  SDOH Interventions   Financial Strain Interventions Intervention Not Indicated     SDOH Screenings   Alcohol Screen: Not on file  Depression (PHQ2-9): Medium Risk  . PHQ-2 Score: 6  Financial Resource Strain: Low Risk   . Difficulty of Paying Living Expenses: Not very hard  Food Insecurity: Not on file  Housing: Not on file  Physical Activity: Not on file  Social Connections: Not on file  Stress: Not on file  Tobacco Use: High Risk  . Smoking Tobacco Use: Current Every Day Smoker  . Smokeless  Tobacco Use: Former Soil scientist Needs: Not on file    [place]  No Known Allergies  Medications Reviewed Today    Reviewed by Pleas Koch, NP (Nurse Practitioner) on 06/14/20 at 1545  Med List Status: <None>  Medication Order Taking? Sig Documenting Provider Last Dose Status Informant  acetaminophen (TYLENOL) 500 MG tablet [id number] Yes Take 1,000 mg by mouth every 6 (six) hours. Rapid release [provider] Taking Active Self  amLODipine (NORVASC) 5 MG tablet [id number]  Take 5 mg by mouth daily. [provider]  Expired 04/08/20 2359 Self  aspirin 81 MG tablet [id number] Yes  Take 81 mg by mouth daily. [provider] Taking Active Self  atorvastatin (LIPITOR) 20 MG tablet [id number] Yes TAKE 1 TABLET BY MOUTH EVERY EVENING FOR CHOLESTEROL Pleas Koch, NP Taking Active   buPROPion [facility] SR) 150 MG 12 hr tablet [id number] Yes One tab daily for 5 days, twice a day starting on day 6. Pleas Koch, NP Taking Active            Med Note Lenor Derrick Feb 16, 2020  4:00 PM) Have not started  ELIQUIS 5 MG TABS tablet [id number] Yes TAKE 1 TABLET BY MOUTH TWICE A DAY Dew, Erskine Squibb, MD Taking Active   ferrous gluconate (FERGON) 324 MG tablet [id number] Yes TAKE 1 TABLET BY MOUTH DAILY WITH BREAKFAST Mansouraty, Telford Nab., MD Taking Active   finasteride (PROSCAR) 5 MG tablet [id number] Yes TAKE 1 TABLET BY MOUTH EVERY DAY McGowan, Shannon A, PA-C Taking Active   glipiZIDE (GLUCOTROL) 10 MG tablet [id number] Yes Take 10 mg by mouth daily before supper.  [provider] Taking Active Self           Med Note Cristela Felt, [facility]   Wed Jun 27, 2017  8:06 AM)    GLUCOSAMINE HCL PO [id number] Yes Take 1 tablet by mouth daily. [provider] Taking Active Self  insulin detemir (LEVEMIR) 100 UNIT/ML injection [id number] Yes Inject 20 units at bedtime.  Patient taking differently: Inject 15 Units into the skin daily.    Lucille Passy, MD Taking Active Self           Med Note Ria Comment Apr 08, 2020  8:33 AM) Evelina Bucy half a dose   lisinopril-hydrochlorothiazide (ZESTORETIC) 20-25 MG tablet [id number] Yes TAKE 1 TABLET BY MOUTH EVERY DAY FOR BLOOD PRESSURE Pleas Koch, NP Taking Active   metFORMIN (GLUCOPHAGE) 500 MG tablet [id number] Yes TAKE 2 TABLETS BY MOUTH TWICE A DAY WITH MEAL FOR DIABETES Pleas Koch, NP Taking Active Self  metoprolol tartrate (LOPRESSOR) 50 MG tablet [id number] Yes TAKE 1/2 TABLET BY MOUTH TWICE A DAY for blood pressure. Pleas Koch, NP Taking Active   pantoprazole (PROTONIX) 40 MG tablet [id number] Yes Take 1 tablet (40 mg total) by mouth daily. For heartburn. Pleas Koch, NP Taking Active   tamsulosin [facility]) 0.4 MG CAPS capsule [id number] Yes Take 1 capsule (0.4 mg total) by mouth 2 (two) times daily. Nori Riis, PA-C Taking Active   tiZANidine (ZANAFLEX) 4 MG tablet [id number] Yes Take 1 tablet (4 mg total) by mouth 2 (two) times daily as needed for muscle spasms. Pleas Koch, NP  Active   triamcinolone cream (KENALOG) 0.5 % [id number] Yes Apply 1 application topically 2 (two) times daily. [provider] Taking Active Self  vitamin B-12 (CYANOCOBALAMIN) 1000 MCG tablet [id number] Yes Take 1,000 mcg by mouth daily. [provider] Taking Active Self          Patient Active Problem List   Diagnosis Date Noted  . Gastric AVM   . Iron deficiency anemia   . Right buttock pain 01/12/2020  . Fall at home, initial encounter 01/12/2020  . Rash and nonspecific skin eruption 12/19/2019  . Chronic pain of right hip 10/17/2019  . Ischemic leg 05/29/2018  . B12 deficiency 04/03/2018  . PAD (peripheral artery disease) ([place]) 12/12/2017  . Pain in limb 12/11/2017  . GERD (gastroesophageal reflux disease) 04/30/2017  . BPH with obstruction/lower urinary tract symptoms 10/14/2014  .  Essential hypertension 06/26/2014  .  Diabetes mellitus type 2, controlled ([place]) 06/26/2014  . HLD (hyperlipidemia) 06/27/2006  . Coronary atherosclerosis 06/27/2006  . Tobacco abuse 06/27/2006    Immunization History  Administered Date(s) Administered  . Moderna Sars-Covid-2 Vaccination 07/09/2019, 08/06/2019  . Pneumococcal Conjugate-13 09/30/2018  . Td 10/08/1994, 11/19/2006    Conditions to be addressed/monitored:  Hypertension, Diabetes and Tobacco use  Care Plan : [place]  Updates made by [name], [place] since 07/06/2020 12:00 AM    Problem: CHL AMB "PATIENT-SPECIFIC PROBLEM"     Long-Range Goal: Disease Management   Start Date: 06/24/2020  Priority: High  Note:   Current Barriers:  . Unable to achieve control of blood pressure   . Tobacco use  Pharmacist Clinical Goal(s):  [name] Patient will contact provider office for questions/concerns as evidenced notation of same in electronic health record through collaboration with PharmD and provider.   Interventions: . 1:1 collaboration with Pleas Koch, NP regarding development and update of comprehensive plan of care as evidenced by provider attestation and co-signature . Inter-disciplinary care team collaboration (see longitudinal plan of care) . Comprehensive medication review performed; medication list updated in electronic medical record  Hypertension (BP goal <140/90) -Uncontrolled -Current treatment: . Amlodipine 5 mg - 1 tablet daily . Lisinopril-HCTZ 20-25 mg - 1 tablet daily . Metoprolol tartrate 50 mg - 1/2 tablet twice daily -Medications previously tried: rampril -Current home readings:  BP - Checking daily around 10:30 AM  06/13/20 - 178/85, 52 06/14/20 - 191/79, 45 06/15/20 - 157/81, 47 06/16/20 - 160/88, 44 06/17/20 - 146/60, 51 06/19/20 - 138/59, 53 06/20/20 - 144/60, 51 06/21/20 - 138/44, 53 06/22/20 - 160/57, 49 06/24/20 - 182/54, 54  -Current dietary habits: fairly high sodium, uses salt substitute. Notices higher BP  after eating bacon or sausage. He reports he drinks a lot of water. -Tobacco use discussed, smoking 1 PPD contributory, encouraged not to check BP within 30 minutes of cigarette use  -Current exercise habits: minimal -Reports BP has always fluctuated, occasional dizziness -Denies hypotensive/hypertensive symptoms -Educated on BP goals and benefits of medications for prevention of heart attack, stroke and kidney damage; -Counseled to monitor BP at home 2-3 days per week, document, and provide log at future appointments -Adherence - misses evening dose every 7-10 days but denies notice of BP elevations on those days -Recommended to continue current medication; Encouraged tobacco cessation and avoid missed doses of medications. Limit foods high in salt (sausage, bacon). Consult PCP - consider amlodipine dose increase.  Diabetes (A1c goal <7%) -Controlled - A1c 6.8% Followed by Dr. Gabriel Carina -Current medications: [name] Glipizide 10 mg - 1 tablet daily before supper . Levemir - 15 units at bedtime . Metformin 500 mg - 2 tablets twice daily  -Medications previously tried: none reported -Reports steroid Injection 06/14/20 -Current home glucose readings - checks daily  fasting glucose: 100-130 fasting, average 122 . post prandial glucose: none -Denies hypoglycemic/hyperglycemic symptoms -Educated on A1c and blood sugar goals; -Counseled to check feet daily and get yearly eye exams -Recommended to continue current medication  Tobacco use (Goal: Tobacco free) -Previous quit attempts: none recent Tried nicotine patches - states not effective Tried Zyban - effective for a while  -Current use: 1/2 PPD cigarettes - been stable at this amount for a long time -Current treatment  Bupropion 150 mg SR - 1 tablet once daily for 5 days, then twice daily thereafter (not taking) -Provided contact information for [place] [employer] (  1-800-QUIT-NOW) and encouraged patient to reach out to this group for  support. -Recommended consider becoming tobacco free - may use Zyban on hand   -Please call for questions or assistance with a quit plan   Patient Goals/Self-Care Activities . Patient will:  - engage in dietary modifications by limiting salty foods - particularly bacon, sausage  - continue current medications until further contact from clinic  Follow Up Plan: The care management team will reach out to the patient again over the next 30 days.      Medication Assistance: None required.  Patient affirms current coverage meets needs.  Patient's preferred pharmacy is:  [id number] [place]Phone: [phone]: [phone] Uses pill box? Yes - fills every 2 weeks [facility] preferred by insurance. Pt endorses missed dose of evening medications about once every 7-10 days   Care Plan and Follow Up Patient Decision:  Patient agrees to Care Plan and Follow-up.  MDebbora Dus PharmD Clinical Pharmacist [phone]

## 2020-07-06 ENCOUNTER — Telehealth: Payer: Self-pay

## 2020-07-06 NOTE — Patient Instructions (Signed)
Dear Antonio [name],  Below is a summary of the goals we discussed during our follow up appointment on Jun 24, 2020. Please contact me anytime with questions or concerns.   Visit Information  Patient Care Plan: CCM Pharmacy Care Plan    Problem Identified: CHL AMB "PATIENT-SPECIFIC PROBLEM"     Long-Range Goal: Disease Management   Start Date: 06/24/2020  Priority: High  Note:   Current Barriers:  . Unable to achieve control of blood pressure   . Tobacco use  Pharmacist Clinical Goal(s):  [name] Patient will contact provider office for questions/concerns as evidenced notation of same in electronic health record through collaboration with PharmD and provider.   Interventions: . 1:1 collaboration with Pleas Koch, NP regarding development and update of comprehensive plan of care as evidenced by provider attestation and co-signature . Inter-disciplinary care team collaboration (see longitudinal plan of care) . Comprehensive medication review performed; medication list updated in electronic medical record  Hypertension (BP goal <140/90) -Uncontrolled -Current treatment: . Amlodipine 5 mg - 1 tablet daily . Lisinopril-HCTZ 20-25 mg - 1 tablet daily . Metoprolol tartrate 50 mg - 1/2 tablet twice daily -Medications previously tried: rampril -Current home readings:  BP - Checking daily around 10:30 AM  06/13/20 - 178/85, 52 06/14/20 - 191/79, 45 06/15/20 - 157/81, 47 06/16/20 - 160/88, 44 06/17/20 - 146/60, 51 06/19/20 - 138/59, 53 06/20/20 - 144/60, 51 06/21/20 - 138/44, 53 06/22/20 - 160/57, 49 06/24/20 - 182/54, 54  -Current dietary habits: fairly high sodium, uses salt substitute. Notices higher BP after eating bacon or sausage. He reports he drinks a lot of water. -Tobacco use discussed, smoking 1 PPD contributory, encouraged not to check BP within 30 minutes of cigarette use  -Current exercise habits: minimal -Reports BP has always fluctuated, occasional dizziness -Denies  hypotensive/hypertensive symptoms -Educated on BP goals and benefits of medications for prevention of heart attack, stroke and kidney damage; -Counseled to monitor BP at home 2-3 days per week, document, and provide log at future appointments -Adherence - misses evening dose every 7-10 days but denies notice of BP elevations on those days -Recommended to continue current medication; Encouraged tobacco cessation and avoid missed doses of medications. Limit foods high in salt (sausage, bacon). Consult PCP - consider amlodipine dose increase.  Diabetes (A1c goal <7%) -Controlled - A1c 6.8% Followed by Dr. Gabriel Carina -Current medications: [name] Glipizide 10 mg - 1 tablet daily before supper . Levemir - 15 units at bedtime . Metformin 500 mg - 2 tablets twice daily  -Medications previously tried: none reported -Reports steroid Injection 06/14/20 -Current home glucose readings - checks daily  fasting glucose: 100-130 fasting, average 122 . post prandial glucose: none -Denies hypoglycemic/hyperglycemic symptoms -Educated on A1c and blood sugar goals; -Counseled to check feet daily and get yearly eye exams -Recommended to continue current medication  Tobacco use (Goal: Tobacco free) -Previous quit attempts: none recent Tried nicotine patches - states not effective Tried Zyban - effective for a while  -Current use: 1/2 PPD cigarettes - been stable at this amount for a long time -Current treatment  Bupropion 150 mg SR - 1 tablet once daily for 5 days, then twice daily thereafter (not taking) -Provided contact information for [place] Quit Line (1-800-QUIT-NOW) and encouraged patient to reach out to this group for support. -Recommended consider becoming tobacco free - may use Zyban on hand   -Please call for questions or assistance with a quit plan   Patient Goals/Self-Care Activities . Patient  will:  - engage in dietary modifications by limiting salty foods - particularly bacon, sausage  - continue  current medications until further contact from clinic  Follow Up Plan: The care management team will reach out to the patient again over the next 30 days.       The patient verbalized understanding of instructions, educational materials, and care plan provided today and agreed to receive a mailed copy of patient instructions, educational materials, and care plan.   [name], PharmD Clinical Pharmacist [place] [facility] at [facility] [phone]  How to Take Your Blood Pressure Blood pressure measures how strongly your blood is pressing against the walls of your arteries. Arteries are blood vessels that carry blood from your heart throughout your body. You can take your blood pressure at home with a machine. You may need to check your blood pressure at home:  To check if you have high blood pressure (hypertension).  To check your blood pressure over time.  To make sure your blood pressure medicine is working. Supplies needed:  Blood pressure machine, or monitor.  Dining room chair to sit in.  Table or desk.  Small notebook.  Pencil or pen. How to prepare Avoid these things for 30 minutes before checking your blood pressure:  Having drinks with caffeine in them, such as coffee or tea.  Drinking alcohol.  Eating.  Smoking.  Exercising. Do these things five minutes before checking your blood pressure:  Go to the bathroom and pee (urinate).  Sit in a dining chair. Do not sit in a soft couch or an armchair.  Be quiet. Do not talk. How to take your blood pressure Follow the instructions that came with your machine. If you have a digital blood pressure monitor, these may be the instructions: 1. Sit up straight. 2. Place your feet on the floor. Do not cross your ankles or legs. 3. Rest your left arm at the level of your heart. You may rest it on a table, desk, or chair. 4. Pull up your shirt sleeve. 5. Wrap the blood pressure cuff around the upper part of  your left arm. The cuff should be 1 inch (2.5 cm) above your elbow. It is best to wrap the cuff around bare skin. 6. Fit the cuff snugly around your arm. You should be able to place only one finger between the cuff and your arm. 7. Place the cord so that it rests in the bend of your elbow. 8. Press the power button. 9. Sit quietly while the cuff fills with air and loses air. 10. Write down the numbers on the screen. 11. Wait 2-3 minutes and then repeat steps 1-10.   What do the numbers mean? Two numbers make up your blood pressure. The first number is called systolic pressure. The second is called diastolic pressure. An example of a blood pressure reading is "120 over 80" (or 120/80). If you are an adult and do not have a medical condition, use this guide to find out if your blood pressure is normal: Normal  First number: below 120.  Second number: below 80. Elevated  First number: 120-129.  Second number: below 80. Hypertension stage 1  First number: 130-139.  Second number: 80-89. Hypertension stage 2  First number: 140 or above.  Second number: 50 or above. Your blood pressure is above normal even if only the top or bottom number is above normal. Follow these instructions at home:  Check your blood pressure as often as your  doctor tells you to.  Check your blood pressure at the same time every day.  Take your monitor to your next doctor's appointment. Your doctor will: ? Make sure you are using it correctly. ? Make sure it is working right.  Make sure you understand what your blood pressure numbers should be.  Tell your doctor if your medicine is causing side effects.  Keep all follow-up visits as told by your doctor. This is important. General tips:  You will need a blood pressure machine, or monitor. Your doctor can suggest a monitor. You can buy one at a drugstore or online. When choosing one: ? Choose one with an arm cuff. ? Choose one that wraps around your  upper arm. Only one finger should fit between your arm and the cuff. ? Do not choose one that measures your blood pressure from your wrist or finger. Where to find more information American Heart Association: www.heart.org Contact a doctor if:  Your blood pressure keeps being high. Get help right away if:  Your first blood pressure number is higher than 180.  Your second blood pressure number is higher than 120. Summary  Check your blood pressure at the same time every day.  Avoid caffeine, alcohol, smoking, and exercise for 30 minutes before checking your blood pressure.  Make sure you understand what your blood pressure numbers should be. This information is not intended to replace advice given to you by your health care provider. Make sure you discuss any questions you have with your health care provider. Document Revised: 01/17/2019 Document Reviewed: 01/17/2019 Elsevier Patient Education  2021 [employer].

## 2020-07-06 NOTE — Telephone Encounter (Signed)
Antonio [name],  Antonio [name] blood pressure seems to still be running high and fluctuating a lot despite checking at the same time each day with arm cuff. Should we try increasing his amlodipine?  Current medications: . Amlodipine 5 mg - 1 tablet daily . Lisinopril-HCTZ 20-25 mg - 1 tablet daily . Metoprolol tartrate 50 mg - 1/2 tablet twice daily -Medications previously tried: rampril  -Current home readings: Checking daily around 10:30 AM  06/13/20 - 178/85, 52 06/14/20 - 191/79, 45 06/15/20 - 157/81, 47 06/16/20 - 160/88, 44 06/17/20 - 146/60, 51 06/19/20 - 138/59, 53 06/20/20 - 144/60, 51 06/21/20 - 138/44, 53 06/22/20 - 160/57, 49 06/24/20 - 182/54, Antonio [name], PharmD Clinical Chartered certified accountant Primary Care at [name] [phone]

## 2020-07-08 NOTE — Telephone Encounter (Signed)
Antonio [name],  BP during last office visit was 138/72 manually. BP during visit in April 2022 was 124/56.  Recommend he come in for nurse visit to verify as his last two office readings have been fine. His cuff may be off.   Can you contact him to schedule nurse visit?  BP Readings from Last 3 Encounters:  06/14/20 138/72  05/18/20 (!) 124/56  04/08/20 (!) 163/39

## 2020-07-08 NOTE — Telephone Encounter (Signed)
NV scheduled for 6/8 at 4:00.

## 2020-07-14 ENCOUNTER — Ambulatory Visit: Payer: Medicare Other

## 2020-07-14 ENCOUNTER — Other Ambulatory Visit: Payer: Self-pay

## 2020-07-14 VITALS — BP 130/58

## 2020-07-14 DIAGNOSIS — I10 Essential (primary) hypertension: Secondary | ICD-10-CM

## 2020-07-14 NOTE — Progress Notes (Signed)
Per Allie Bossier encounter order on 07/08/20, patient presents today for a nurse visit blood pressure check for ongoing follow up and management.  Vital Sign Readings today BP: 130/58.

## 2020-07-22 ENCOUNTER — Other Ambulatory Visit: Payer: Self-pay | Admitting: Primary Care

## 2020-07-22 DIAGNOSIS — E782 Mixed hyperlipidemia: Secondary | ICD-10-CM

## 2020-08-03 ENCOUNTER — Other Ambulatory Visit: Payer: Self-pay | Admitting: Primary Care

## 2020-08-03 DIAGNOSIS — M25551 Pain in right hip: Secondary | ICD-10-CM

## 2020-08-18 ENCOUNTER — Other Ambulatory Visit: Payer: Self-pay | Admitting: Primary Care

## 2020-08-18 DIAGNOSIS — M25551 Pain in right hip: Secondary | ICD-10-CM

## 2020-08-18 DIAGNOSIS — G8929 Other chronic pain: Secondary | ICD-10-CM

## 2020-08-18 NOTE — Telephone Encounter (Signed)
Received refill request for his muscle relaxer (tizanidine).    How often is he taking this medication?  We just refilled his medication in late June 2022 for 30-day supply.  Does he actually need a refill?

## 2020-08-19 NOTE — Telephone Encounter (Signed)
Called number not available.

## 2020-08-24 NOTE — Telephone Encounter (Signed)
Called patient he does not need was auto refill.

## 2020-08-25 ENCOUNTER — Telehealth: Payer: Self-pay

## 2020-08-25 NOTE — Chronic Care Management (AMB) (Addendum)
Chronic Care Management Pharmacy Assistant   Name: VITALI SEIBERT  MRN: [id number] DOB: [birth year]  Reason for Encounter: Disease State - HTN   Recent office visits:  07/14/20 - Nurse visit for BP check - 130/58   Recent consult visits:  None since last CCM contact  Hospital visits:  None in previous 6 months  Medications: Outpatient Encounter Medications as of 08/25/2020  Medication Sig Note   acetaminophen (TYLENOL) 500 MG tablet Take 1,000 mg by mouth every 6 (six) hours. Rapid release    amLODipine (NORVASC) 5 MG tablet Take 5 mg by mouth daily.    aspirin 81 MG tablet Take 81 mg by mouth daily.    atorvastatin (LIPITOR) 20 MG tablet TAKE 1 TABLET BY MOUTH EVERY EVENING FOR CHOLESTEROL    buPROPion (WELLBUTRIN SR) 150 MG 12 hr tablet One tab daily for 5 days, twice a day starting on day 6. 02/16/2020: Have not started   ELIQUIS 5 MG TABS tablet TAKE 1 TABLET BY MOUTH TWICE A DAY    ferrous gluconate (FERGON) 324 MG tablet TAKE 1 TABLET BY MOUTH DAILY WITH BREAKFAST    finasteride (PROSCAR) 5 MG tablet TAKE 1 TABLET BY MOUTH EVERY DAY    glipiZIDE (GLUCOTROL) 10 MG tablet Take 10 mg by mouth daily before supper.     GLUCOSAMINE HCL PO Take 1 tablet by mouth daily.    insulin detemir (LEVEMIR) 100 UNIT/ML injection Inject 20 units at bedtime. (Patient taking differently: Inject 15 Units into the skin daily.) 04/08/2020: Took half a dose    lisinopril-hydrochlorothiazide (ZESTORETIC) 20-25 MG tablet TAKE 1 TABLET BY MOUTH EVERY DAY FOR BLOOD PRESSURE    metFORMIN (GLUCOPHAGE) 500 MG tablet TAKE 2 TABLETS BY MOUTH TWICE A DAY WITH MEAL FOR DIABETES    metoprolol tartrate (LOPRESSOR) 50 MG tablet TAKE 1/2 TABLET BY MOUTH TWICE A DAY for blood pressure.    pantoprazole (PROTONIX) 40 MG tablet Take 1 tablet (40 mg total) by mouth daily. For heartburn.    tamsulosin (FLOMAX) 0.4 MG CAPS capsule Take 1 capsule (0.4 mg total) by mouth 2 (two) times daily.    tiZANidine (ZANAFLEX) 4 MG  tablet TAKE 1 TABLET (4 MG TOTAL) BY MOUTH 2 (TWO) TIMES DAILY AS NEEDED FOR MUSCLE SPASMS.    triamcinolone cream (KENALOG) 0.5 % Apply 1 application topically 2 (two) times daily.    vitamin B-12 (CYANOCOBALAMIN) 1000 MCG tablet Take 1,000 mcg by mouth daily.    No facility-administered encounter medications on file as of 08/25/2020.   Recent Office Vitals: BP Readings from Last 3 Encounters:  07/14/20 (!) 130/58  06/14/20 138/72  05/18/20 (!) 124/56   Pulse Readings from Last 3 Encounters:  06/14/20 (!) 53  05/18/20 71  04/08/20 (!) 57    Wt Readings from Last 3 Encounters:  06/14/20 163 lb (73.9 kg)  05/18/20 163 lb (73.9 kg)  04/01/20 163 lb (73.9 kg)     Kidney Function Lab Results  Component Value Date/Time   CREATININE 1.19 12/02/2019 08:53 AM   CREATININE 1.13 09/24/2018 08:19 AM   GFR 59.99 (L) 12/02/2019 08:53 AM   GFRNONAA >60 05/31/2018 05:29 AM   GFRAA >60 05/31/2018 05:29 AM    BMP Latest Ref Rng & Units 12/02/2019 09/24/2018 05/31/2018  Glucose 70 - 99 mg/dL 74 154(H) 195(H)  BUN 6 - 23 mg/dL 26(H) 30(H) 17  Creatinine 0.40 - 1.50 mg/dL 1.19 1.13 0.91  BUN/Creat Ratio 10 - 22 - - -  Sodium 135 - 145 mEq/L 142 139 136  Potassium 3.5 - 5.1 mEq/L 5.2(H) 4.3 3.9  Chloride 96 - 112 mEq/L 113(H) 104 103  CO2 19 - 32 mEq/L 21 25 26   Calcium 8.4 - 10.5 mg/dL 8.9 9.5 8.4(L)     Attempted contact with [name] 3 times on 08/27/20,08/30/20,09/01/20. Unsuccessful outreach. Will attempt contact next month.   Current antihypertensive regimen:  Metoprolol tartrate 50 mg - 1/2 tablet twice daily Amlodipine 5 mg - 1 tablet daily Lisinopril-HCTZ 20-25 mg - 1 tablet daily  Any recent hospitalizations or ED visits since last visit with CPP? No  Adherence Review: Is the patient currently on ACE/ARB medication? Yes Does the patient have >5 day gap between last estimated fill dates? No   Star Rating Drugs:  Medication:  Last Fill: Day Supply Atorvastatin  20mg  07/22/20 90 Metformin 500mg  06/28/20 90 Lisinopril 20-25 mg 06/28/20 90 Glipizide 10mg  07/27/20 90 Levemir  07/06/20 28  (unable to verify with patient)   Care Gaps: Last annual wellness visit: 09/10/2018  No appointments scheduled within the next 30 days.  [name], CPP notified  Avel Sensor, [place] Assistant [phone]  I have reviewed the care management and care coordination activities outlined in this encounter and I am certifying that I agree with the content of this note. No further action required.  [name], PharmD Clinical Pharmacist [place] [facility] at [facility] [phone]

## 2020-08-28 ENCOUNTER — Other Ambulatory Visit: Payer: Self-pay | Admitting: Gastroenterology

## 2020-08-28 DIAGNOSIS — K2990 Gastroduodenitis, unspecified, without bleeding: Secondary | ICD-10-CM

## 2020-09-30 ENCOUNTER — Other Ambulatory Visit: Payer: Self-pay | Admitting: Primary Care

## 2020-09-30 DIAGNOSIS — I10 Essential (primary) hypertension: Secondary | ICD-10-CM

## 2020-09-30 DIAGNOSIS — E11618 Type 2 diabetes mellitus with other diabetic arthropathy: Secondary | ICD-10-CM

## 2020-09-30 DIAGNOSIS — Z794 Long term (current) use of insulin: Secondary | ICD-10-CM

## 2020-09-30 NOTE — Telephone Encounter (Signed)
Patient sees endocrinology, send refill request for metformin to their office.

## 2020-10-10 ENCOUNTER — Other Ambulatory Visit (INDEPENDENT_AMBULATORY_CARE_PROVIDER_SITE_OTHER): Payer: Self-pay | Admitting: Vascular Surgery

## 2020-10-24 ENCOUNTER — Other Ambulatory Visit: Payer: Self-pay | Admitting: Primary Care

## 2020-10-24 DIAGNOSIS — I10 Essential (primary) hypertension: Secondary | ICD-10-CM

## 2020-11-10 DIAGNOSIS — E1129 Type 2 diabetes mellitus with other diabetic kidney complication: Secondary | ICD-10-CM | POA: Diagnosis not present

## 2020-11-10 DIAGNOSIS — I10 Essential (primary) hypertension: Secondary | ICD-10-CM | POA: Diagnosis not present

## 2020-11-10 DIAGNOSIS — R809 Proteinuria, unspecified: Secondary | ICD-10-CM | POA: Diagnosis not present

## 2020-11-10 DIAGNOSIS — Z794 Long term (current) use of insulin: Secondary | ICD-10-CM | POA: Diagnosis not present

## 2020-11-17 ENCOUNTER — Telehealth: Payer: Self-pay

## 2020-11-17 DIAGNOSIS — I10 Essential (primary) hypertension: Secondary | ICD-10-CM | POA: Diagnosis not present

## 2020-11-17 DIAGNOSIS — E1159 Type 2 diabetes mellitus with other circulatory complications: Secondary | ICD-10-CM | POA: Diagnosis not present

## 2020-11-17 DIAGNOSIS — E1142 Type 2 diabetes mellitus with diabetic polyneuropathy: Secondary | ICD-10-CM | POA: Diagnosis not present

## 2020-11-17 DIAGNOSIS — E1129 Type 2 diabetes mellitus with other diabetic kidney complication: Secondary | ICD-10-CM | POA: Diagnosis not present

## 2020-11-17 DIAGNOSIS — F17210 Nicotine dependence, cigarettes, uncomplicated: Secondary | ICD-10-CM | POA: Diagnosis not present

## 2020-11-17 DIAGNOSIS — R809 Proteinuria, unspecified: Secondary | ICD-10-CM | POA: Diagnosis not present

## 2020-11-17 DIAGNOSIS — Z794 Long term (current) use of insulin: Secondary | ICD-10-CM | POA: Diagnosis not present

## 2020-11-17 NOTE — Chronic Care Management (AMB) (Addendum)
Chronic Care Management Pharmacy Assistant   Name: Antonio [name]  MRN: [id number] DOB: [birth year]   Reason for Encounter: Hypertension Disease State    Recent office visits:  07/14/20-Family Medicine-Patient presented for nurse visit for BP check 130/58 . No medication changes    Recent consult visits:  None since last CCM contact   Hospital visits:  None in previous 6 months  Medications: Outpatient Encounter Medications as of 11/17/2020  Medication Sig Note   acetaminophen (TYLENOL) 500 MG tablet Take 1,000 mg by mouth every 6 (six) hours. Rapid release    amLODipine (NORVASC) 5 MG tablet Take 5 mg by mouth daily.    aspirin 81 MG tablet Take 81 mg by mouth daily.    atorvastatin (LIPITOR) 20 MG tablet TAKE 1 TABLET BY MOUTH EVERY EVENING FOR CHOLESTEROL    buPROPion (WELLBUTRIN SR) 150 MG 12 hr tablet One tab daily for 5 days, twice a day starting on day 6. 02/16/2020: Have not started   ELIQUIS 5 MG TABS tablet TAKE 1 TABLET BY MOUTH TWICE A DAY    ferrous gluconate (FERGON) 324 MG tablet TAKE 1 TABLET BY MOUTH EVERY DAY WITH BREAKFAST    finasteride (PROSCAR) 5 MG tablet TAKE 1 TABLET BY MOUTH EVERY DAY    glipiZIDE (GLUCOTROL) 10 MG tablet Take 10 mg by mouth daily before supper.     GLUCOSAMINE HCL PO Take 1 tablet by mouth daily.    insulin detemir (LEVEMIR) 100 UNIT/ML injection Inject 20 units at bedtime. (Patient taking differently: Inject 15 Units into the skin daily.) 04/08/2020: Took half a dose    lisinopril-hydrochlorothiazide (ZESTORETIC) 20-25 MG tablet TAKE 1 TABLET BY MOUTH EVERY DAY FOR BLOOD PRESSURE    metFORMIN (GLUCOPHAGE) 500 MG tablet TAKE 2 TABLETS BY MOUTH TWICE A DAY WITH MEAL FOR DIABETES    metoprolol tartrate (LOPRESSOR) 50 MG tablet TAKE 1/2 TABLET BY MOUTH TWICE A DAY for blood pressure. Office visit required for further refills.    pantoprazole (PROTONIX) 40 MG tablet Take 1 tablet (40 mg total) by mouth daily. For heartburn.    tamsulosin  (FLOMAX) 0.4 MG CAPS capsule Take 1 capsule (0.4 mg total) by mouth 2 (two) times daily.    tiZANidine (ZANAFLEX) 4 MG tablet TAKE 1 TABLET (4 MG TOTAL) BY MOUTH 2 (TWO) TIMES DAILY AS NEEDED FOR MUSCLE SPASMS.    triamcinolone cream (KENALOG) 0.5 % Apply 1 application topically 2 (two) times daily.    vitamin B-12 (CYANOCOBALAMIN) 1000 MCG tablet Take 1,000 mcg by mouth daily.    No facility-administered encounter medications on file as of 11/17/2020.     Recent Office Vitals: BP Readings from Last 3 Encounters:  07/14/20 (!) 130/58  06/14/20 138/72  05/18/20 (!) 124/56   Pulse Readings from Last 3 Encounters:  06/14/20 (!) 53  05/18/20 71  04/08/20 (!) 57    Wt Readings from Last 3 Encounters:  06/14/20 163 lb (73.9 kg)  05/18/20 163 lb (73.9 kg)  04/01/20 163 lb (73.9 kg)     Kidney Function Lab Results  Component Value Date/Time   CREATININE 1.19 12/02/2019 08:53 AM   CREATININE 1.13 09/24/2018 08:19 AM   GFR 59.99 (L) 12/02/2019 08:53 AM   GFRNONAA >60 05/31/2018 05:29 AM   GFRAA >60 05/31/2018 05:29 AM    BMP Latest Ref Rng & Units 12/02/2019 09/24/2018 05/31/2018  Glucose 70 - 99 mg/dL 74 154(H) 195(H)  BUN 6 - 23 mg/dL 26(H) 30(H) 17  Creatinine  0.40 - 1.50 mg/dL 1.19 1.13 0.91  BUN/Creat Ratio 10 - 22 - - -  Sodium 135 - 145 mEq/L 142 139 136  Potassium 3.5 - 5.1 mEq/L 5.2(H) 4.3 3.9  Chloride 96 - 112 mEq/L 113(H) 104 103  CO2 19 - 32 mEq/L 21 25 26   Calcium 8.4 - 10.5 mg/dL 8.9 9.5 8.4(L)     Contacted patient on 11/18/20 to discuss hypertension disease state  Current antihypertensive regimen:  Amlodipine 5mg  1 tablet daily  Lisinopril/Hctz 20-25mg  1 tablet daily  Metoprolol tartrate 50mg   1/2 tablet 2 times daily    Patient verbally confirms he is taking the above medications as directed. Yes  How often are you checking your Blood Pressure? infrequently  he checks his blood pressure in the morning before taking his medication.  Current home BP  readings: The patient reports he has not taken BP regularly in a while.Encouraged the patient to start recording BP's more frequently.         118/51, 136/58   2 readings he had from 2 weeks ago .   Wrist or arm cuff: wrist cuff Caffeine intake: regular coffee in am,decaf soda through the day . Salt intake: limits adding to food OTC medications including pseudoephedrine or NSAIDs? No  Any readings above 180/120? No   What recent interventions/DTPs have been made by any provider to improve Blood Pressure control since last CPP Visit: counseled to monitor BP frequently with documentation.Consider amlodipine dose increase.  Any recent hospitalizations or ED visits since last visit with CPP? No  What diet changes have been made to improve Blood Pressure Control? The patient reports he watches his carbohydrate intake, limits his salt intake and drinks plenty of water.   What exercise is being done to improve your Blood Pressure Control?  Patient reports he stays active going to grocery store, walks the dog several times a day.   Adherence Review: Is the patient currently on Antonio/ARB medication? Yes Does the patient have >5 day gap between last estimated fill dates? No   Star Rating Drugs:  Medication:  Last Fill: Day Supply Atorvastatin 20mg        10/25/20            90 Metformin 500mg         11/17/20          90 Lisinopril 20-25 mg      09/30/20            90 Glipizide 10mg             10/25/20            90 Levemir                       10/12/20              28     Care Gaps: Annual wellness visit in last year? No  09/10/18 Most Recent BP reading: 130/58  07/14/20   If Diabetic: Most recent A1C reading:  7.1  05/05/20 Last eye exam / retinopathy screening: 06/03/20 Last diabetic foot exam: 05/12/20  No appointments scheduled within the next 30 days. CCM visit - 01/2021  [name], CPP notified  Avel Sensor, [place] Assistant [phone]  I have reviewed the  care management and care coordination activities outlined in this encounter and I am certifying that I agree with the content of this note. No further action required.  [name], PharmD Clinical Pharmacist [place]  Primary Care at [place]

## 2020-12-22 DIAGNOSIS — E113393 Type 2 diabetes mellitus with moderate nonproliferative diabetic retinopathy without macular edema, bilateral: Secondary | ICD-10-CM | POA: Diagnosis not present

## 2021-01-06 ENCOUNTER — Other Ambulatory Visit: Payer: Self-pay | Admitting: Primary Care

## 2021-01-06 DIAGNOSIS — I10 Essential (primary) hypertension: Secondary | ICD-10-CM

## 2021-01-06 NOTE — Telephone Encounter (Signed)
Patient needs annual follow up visit. Okay to wait until we are back in early 2023. Please schedule.

## 2021-01-11 NOTE — Telephone Encounter (Signed)
App made with patient.

## 2021-01-13 ENCOUNTER — Telehealth: Payer: Self-pay

## 2021-01-13 NOTE — Progress Notes (Signed)
    Chronic Care Management Pharmacy Assistant   Name: Antonio [name]  MRN: [id number] DOB: 03/20/1945  Reason for Encounter: CCM (Appointment Reminder)   Medications: Outpatient Encounter Medications as of 01/13/2021  Medication Sig Note   acetaminophen (TYLENOL) 500 MG tablet Take 1,000 mg by mouth every 6 (six) hours. Rapid release    amLODipine (NORVASC) 5 MG tablet Take 5 mg by mouth daily.    aspirin 81 MG tablet Take 81 mg by mouth daily.    atorvastatin (LIPITOR) 20 MG tablet TAKE 1 TABLET BY MOUTH EVERY EVENING FOR CHOLESTEROL    buPROPion (WELLBUTRIN SR) 150 MG 12 hr tablet One tab daily for 5 days, twice a day starting on day 6. 02/16/2020: Have not started   ELIQUIS 5 MG TABS tablet TAKE 1 TABLET BY MOUTH TWICE A DAY    ferrous gluconate (FERGON) 324 MG tablet TAKE 1 TABLET BY MOUTH EVERY DAY WITH BREAKFAST    finasteride (PROSCAR) 5 MG tablet TAKE 1 TABLET BY MOUTH EVERY DAY    glipiZIDE (GLUCOTROL) 10 MG tablet Take 10 mg by mouth daily before supper.     GLUCOSAMINE HCL PO Take 1 tablet by mouth daily.    insulin detemir (LEVEMIR) 100 UNIT/ML injection Inject 20 units at bedtime. (Patient taking differently: Inject 15 Units into the skin daily.) 04/08/2020: Took half a dose    lisinopril-hydrochlorothiazide (ZESTORETIC) 20-25 MG tablet Take 1 tablet by mouth daily. For blood pressure. Office visit required for further refills.    metFORMIN (GLUCOPHAGE) 500 MG tablet TAKE 2 TABLETS BY MOUTH TWICE A DAY WITH MEAL FOR DIABETES    metoprolol tartrate (LOPRESSOR) 50 MG tablet TAKE 1/2 TABLET BY MOUTH TWICE A DAY for blood pressure. Office visit required for further refills.    pantoprazole (PROTONIX) 40 MG tablet Take 1 tablet (40 mg total) by mouth daily. For heartburn.    tamsulosin (FLOMAX) 0.4 MG CAPS capsule Take 1 capsule (0.4 mg total) by mouth 2 (two) times daily.    tiZANidine (ZANAFLEX) 4 MG tablet TAKE 1 TABLET (4 MG TOTAL) BY MOUTH 2 (TWO) TIMES DAILY AS NEEDED FOR MUSCLE  SPASMS.    triamcinolone cream (KENALOG) 0.5 % Apply 1 application topically 2 (two) times daily.    vitamin B-12 (CYANOCOBALAMIN) 1000 MCG tablet Take 1,000 mcg by mouth daily.    No facility-administered encounter medications on file as of 01/13/2021.   Antonio [name] was contacted to remind him of his upcoming telephone visit with Antonio [name] on 01/18/2021 at 2:00. Patient is unable to keep appointment due to work. Patient was reschedule for a follow-up telephone visit with Antonio [name] on 03/28/2021 at 11:30 am. Patient is only available from 11:30-12:30 due to work.   Star Rating Drugs: Medication:  Last Fill: Day Supply Atorvastatin 20 mg 10/25/2020 90 Metformin 500 mg 11/17/2020 90 Lisinopril 20-25 mg 01/06/2021 90 Glipizide 10 mg 10/25/2020 90 Levemir  01/04/2021 [place], CPP notified  Marijean Niemann, [place] Clinical Pharmacy Assistant [phone]  Time Spent: 10

## 2021-01-18 ENCOUNTER — Telehealth: Payer: Medicare Other

## 2021-01-18 ENCOUNTER — Other Ambulatory Visit: Payer: Self-pay | Admitting: Primary Care

## 2021-01-18 DIAGNOSIS — I10 Essential (primary) hypertension: Secondary | ICD-10-CM

## 2021-01-18 NOTE — Progress Notes (Deleted)
10:03 PM   KATIE MOCH 1946/01/17 [id number]  Referring provider: Pleas Koch, NP [place] [place],  [place] [address]  No chief complaint on file.  Urological history: 1. 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 BPH with LU TS -PSA pending -TARGIS > 10 years ago -cysto 2019 - significant lateral lobe hypertrophy and median lobe with intravesical protrusion -I PSS *** -PVR *** -managed with tamsulosin 0.4 mg two capsules daily and finasteride 5 mg daily  HPI: Antonio [name] is a 75 y.o. male who presents today for yearly follow up.       Score:  1-7 Mild 8-19 Moderate 20-35 Severe     PMH: Past Medical History:  Diagnosis Date   Arthritis    Asthma    age 19   BPH with urinary obstruction    stable on flomax ([name]   Coronary atherosclerosis of unspecified type of vessel, native or graft    Diabetes mellitus    Type II   Emphysema of lung (HCC)    Esophageal stricture    Gastritis    GERD (gastroesophageal reflux disease)    Hiatal hernia    Hx of colonic polyps    Hyperlipidemia    Hypertension    Internal hemorrhoids without mention of complication    Lumbago    Pain in both lower legs    PSA elevation    now averaging 2's   Tobacco abuse    URI (upper respiratory infection)    Urinary retention     Surgical History: Past Surgical History:  Procedure Laterality Date   2D Echo  06/03   Abdominal ultrasound  06/03   Negative   BACK SURGERY     COLONOSCOPY     CT of head  and sinuses  06/03   Negative   CT of the chest, abdomen, pelvis  06/03   Chest negative; Abdomen, left adrenal lesion; Pelvis, enlarged prostate   ESOPHAGOGASTRODUODENOSCOPY  02/13/07   gastritis and duodenitis without bleed   ESOPHAGOGASTRODUODENOSCOPY N/A 06/26/2014   Procedure: ESOPHAGOGASTRODUODENOSCOPY (EGD);  Surgeon: Milus Banister, MD;  Location: [facility];  Service: Endoscopy;  Laterality: N/A;    ESOPHAGOGASTRODUODENOSCOPY (EGD) WITH PROPOFOL N/A 04/08/2020   Procedure: ESOPHAGOGASTRODUODENOSCOPY (EGD) WITH PROPOFOL;  Surgeon: Ladene Artist, MD;  Location: [facility];  Service: Endoscopy;  Laterality: N/A;   HOT HEMOSTASIS N/A 04/08/2020   Procedure: HOT HEMOSTASIS (ARGON PLASMA COAGULATION/BICAP);  Surgeon: Ladene Artist, MD;  Location: [facility];  Service: Endoscopy;  Laterality: N/A;   KNEE ARTHROSCOPY  10/00   Right   LOWER EXTREMITY ANGIOGRAPHY Right 12/27/2017   Procedure: LOWER EXTREMITY ANGIOGRAPHY;  Surgeon: Algernon Huxley, MD;  Location: [facility];  Service: Cardiovascular;  Laterality: Right;   LOWER EXTREMITY ANGIOGRAPHY Left 01/10/2018   Procedure: LOWER EXTREMITY ANGIOGRAPHY;  Surgeon: Algernon Huxley, MD;  Location: [facility];  Service: Cardiovascular;  Laterality: Left;   LOWER EXTREMITY ANGIOGRAPHY Left 05/29/2018   Procedure: LOWER EXTREMITY ANGIOGRAPHY;  Surgeon: Algernon Huxley, MD;  Location: [facility];  Service: Cardiovascular;  Laterality: Left;   LOWER EXTREMITY ANGIOGRAPHY Left 05/30/2018   Procedure: Lower Extremity Angiography;  Surgeon: Algernon Huxley, MD;  Location: [facility];  Service: Cardiovascular;  Laterality: Left;   LP  06/03   Microwave thermotherapy prostate  08/28/07   [facility] Blocker   Persantine cardiolite  03/01  68%  ° POLYPECTOMY  11/95  ° Stress myoview  07/06  ° EF 67%  ° UPPER GASTROINTESTINAL ENDOSCOPY    ° ° °Home Medications:  °Allergies as of 01/19/2021   °No Known Allergies °  ° °  °Medication List  °  ° °  ° Accurate as of January 18, 2021 10:03 PM. If you have any questions, ask your nurse or doctor.  °  °  ° °  ° °acetaminophen 500 MG tablet °Commonly known as: TYLENOL °Take 1,000 mg by mouth every 6 (six) hours. Rapid release °  °amLODipine 5 MG tablet °Commonly known as: NORVASC °Take 5 mg by mouth daily. °  °aspirin 81 MG tablet °Take 81 mg by mouth daily. °  °atorvastatin 20 MG tablet °Commonly known  as: LIPITOR °TAKE 1 TABLET BY MOUTH EVERY EVENING FOR CHOLESTEROL °  °buPROPion 150 MG 12 hr tablet °Commonly known as: WELLBUTRIN SR °One tab daily for 5 days, twice a day starting on day 6. °  °Eliquis 5 MG Tabs tablet °Generic drug: apixaban °TAKE 1 TABLET BY MOUTH TWICE A DAY °  °ferrous gluconate 324 MG tablet °Commonly known as: FERGON °TAKE 1 TABLET BY MOUTH EVERY DAY WITH BREAKFAST °  °finasteride 5 MG tablet °Commonly known as: PROSCAR °TAKE 1 TABLET BY MOUTH EVERY DAY °  °glipiZIDE 10 MG tablet °Commonly known as: GLUCOTROL °Take 10 mg by mouth daily before supper. °  °GLUCOSAMINE HCL PO °Take 1 tablet by mouth daily. °  °insulin detemir 100 UNIT/ML injection °Commonly known as: Levemir °Inject 20 units at bedtime. °What changed:  °how much to take °how to take this °when to take this °additional instructions °  °lisinopril-hydrochlorothiazide 20-25 MG tablet °Commonly known as: ZESTORETIC °Take 1 tablet by mouth daily. For blood pressure. Office visit required for further refills. °  °metFORMIN 500 MG tablet °Commonly known as: GLUCOPHAGE °TAKE 2 TABLETS BY MOUTH TWICE A DAY WITH MEAL FOR DIABETES °  °metoprolol tartrate 50 MG tablet °Commonly known as: LOPRESSOR °TAKE 1/2 TABLET BY MOUTH TWICE A DAY FOR BLOOD PRESSURE. OFFICE VISIT REQUIRED. °  °pantoprazole 40 MG tablet °Commonly known as: PROTONIX °Take 1 tablet (40 mg total) by mouth daily. For heartburn. °  °tamsulosin 0.4 MG Caps capsule °Commonly known as: FLOMAX °Take 1 capsule (0.4 mg total) by mouth 2 (two) times daily. °  °tiZANidine 4 MG tablet °Commonly known as: ZANAFLEX °TAKE 1 TABLET (4 MG TOTAL) BY MOUTH 2 (TWO) TIMES DAILY AS NEEDED FOR MUSCLE SPASMS. °  °triamcinolone cream 0.5 % °Commonly known as: KENALOG °Apply 1 application topically 2 (two) times daily. °  °vitamin B-12 1000 MCG tablet °Commonly known as: CYANOCOBALAMIN °Take 1,000 mcg by mouth daily. °  ° °  ° ° °Allergies: No Known Allergies ° °Family History: °Family History   °Problem Relation Age of Onset  ° Diabetes Father   ° Alcohol abuse Paternal Uncle   ° Alcohol abuse Paternal Uncle   ° Alcohol abuse Paternal Uncle   ° Rheum arthritis Mother   ° Heart disease Neg Hx   ° Stroke Neg Hx   ° Cancer Neg Hx   ° Drug abuse Neg Hx   ° Depression Neg Hx   ° Colon cancer Neg Hx   ° Rectal cancer Neg Hx   ° Stomach cancer Neg Hx   ° Kidney cancer Neg Hx   ° Bladder Cancer Neg Hx   ° Prostate cancer Neg Hx   ° ° °Social   Social History:  reports that he has been smoking cigarettes. He has a 85.50 pack-year smoking history. He has quit using smokeless tobacco.  His smokeless tobacco use included chew. He reports that he does not currently use alcohol. He reports that he does not use drugs.  ROS: For pertinent review of systems please refer to history of present illness  Physical Exam: Blood pressure (!) 152/60, pulse 79, height 5' 5" (1.651 m), weight 166 lb (75.3 kg). *** Constitutional:  Well nourished. Alert and oriented, No acute distress. HEENT: [place] AT, moist mucus membranes.  Trachea midline Cardiovascular: No clubbing, cyanosis, or edema. Respiratory: Normal respiratory effort, no increased work of breathing. GI: Abdomen is soft, non tender, non distended, no abdominal masses. Liver and spleen not palpable.  No hernias appreciated.  Stool sample for occult testing is not indicated.   GU: No CVA tenderness.  No bladder fullness or masses.  Patient with circumcised/uncircumcised phallus. ***Foreskin easily retracted***  Urethral meatus is patent.  No penile discharge. No penile lesions or rashes. Scrotum without lesions, cysts, rashes and/or edema.  Testicles are located scrotally bilaterally. No masses are appreciated in the testicles. Left and right epididymis are normal. Rectal: Patient with  normal sphincter tone. Anus and perineum without scarring or rashes. No rectal masses are appreciated. Prostate is approximately *** grams, *** nodules are appreciated. Seminal vesicles are  normal. Skin: No rashes, bruises or suspicious lesions. Lymph: No inguinal adenopathy. Neurologic: Grossly intact, no focal deficits, moving all 4 extremities. Psychiatric: Normal mood and affect.    Laboratory Data: Hemoglobin A1C 4.2 - 5.6 % 6.8 High    Average Blood Glucose (Calc) mg/dL [facility] - LAB  Narrative Performed by [facility] - LAB Normal Range:    4.2 - 5.6%  Increased Risk:  5.7 - 6.4%  Diabetes:        >= 6.5%  Glycemic Control for adults with diabetes:  <7%   Specimen Collected: 11/10/20 07:49 Last Resulted: 11/10/20 08:49  Received From: [facility]  Result Received: 11/18/20 17:17   Cholesterol, Total 100 - 200 mg/dL 113   Triglyceride 35 - 199 mg/dL 157   HDL (High Density Lipoprotein) Cholesterol 29.0 - 71.0 mg/dL 37.8   LDL Calculated 0 - 130 mg/dL 44   VLDL Cholesterol mg/dL 31   Cholesterol/HDL Ratio  3.0   Resulting [facility] - LAB  Specimen Collected: 11/10/20 07:49 Last Resulted: 11/10/20 09:47  Received From: [facility]  Result Received: 11/18/20 17:17   Glucose 70 - 110 mg/dL 159 High    Sodium 136 - 145 mmol/L 139   Potassium 3.6 - 5.1 mmol/L 4.7   Chloride 97 - 109 mmol/L 106   Carbon Dioxide (CO2) 22.0 - 32.0 mmol/L 25.0   Calcium 8.7 - 10.3 mg/dL 9.0   Urea Nitrogen (BUN) 7 - 25 mg/dL 19   Creatinine 0.7 - 1.3 mg/dL 0.9   Glomerular Filtration Rate (eGFR), MDRD Estimate >60 mL/min/1.73sq m 82   BUN/Crea Ratio 6.0 - 20.0 21.1 High    Anion Gap w/K 6.0 - 16.0 12.7   Resulting Agency  [facility] - LAB  Specimen Collected: 11/10/20 07:49 Last Resulted: 11/10/20 09:04  Received From: [facility]  Result Received: 01/06/21 06:41  I have reviewed the labs.  Pertinent Imaging: ***   Assessment & Plan:   1. BPH with LUTS -PSA stable -DRE benign -UA  PVR < 300 cc °-symptoms - *** °-most bothersome  symptoms are *** °-continue conservative management, avoiding bladder irritants and timed voiding's °-Initiate alpha-blocker (***), discussed side effects *** °-Initiate 5 alpha reductase inhibitor (***), discussed side effects *** °-Continue tamsulosin 0.4 mg two daily and finasteride 5 mg daily***:refills given °-Cannot tolerate medication or medication failure, schedule cystoscopy *** ° °  °No follow-ups on file. ° °SHANNON MCGOWAN, PA-C ° °[facility] °[address] °[place], [place] [address] °[phone] °

## 2021-01-18 NOTE — Progress Notes (Signed)
Subjective:   Antonio [name] is a 75 y.o. male who presents for Medicare Annual/Subsequent preventive examination.  I connected with Audrea Muscat today by telephone and verified that I am speaking with the correct person using two identifiers. Location patient: home Location provider: work Persons participating in the virtual visit: patient, Marine scientist.    I discussed the limitations, risks, security and privacy concerns of performing an evaluation and management service by telephone and the availability of in person appointments. I also discussed with the patient that there may be a patient responsible charge related to this service. The patient expressed understanding and verbally consented to this telephonic visit.    Interactive audio and video telecommunications were attempted between this provider and patient, however failed, due to patient having technical difficulties OR patient did not have access to video capability.  We continued and completed visit with audio only.  Some vital signs may be absent or patient reported.   Time Spent with patient on telephone encounter: 25 minutes  Review of Systems     Cardiac Risk Factors include: advanced age (>35men, >42 women);diabetes mellitus;hypertension;dyslipidemia     Objective:    Today's Vitals   01/20/21 1316  Weight: 163 lb (73.9 kg)  Height: 5\' 6"  (1.676 m)   Body mass index is 26.31 kg/m.  Advanced Directives 01/20/2021 04/08/2020 09/23/2018 05/29/2018 01/10/2018 12/27/2017 09/14/2017  Does Patient Have a Medical Advance Directive? Yes Yes Yes Yes Yes No Yes  Type of Paramedic of [place];Living will [place];Living will Healthcare Power of Attorney Living will [place];Living will  Does patient want to make changes to medical advance directive? Yes (MAU/Ambulatory/Procedural Areas - Information given) - - No - Patient declined No - Patient declined - -  Copy of  [place] in Chart? Yes - validated most recent copy scanned in chart (See row information) Yes - validated most recent copy scanned in chart (See row information) No - copy requested - - - Yes  Would patient like information on creating a medical advance directive? - - - - - No - Patient declined -    Current Medications (verified) Outpatient Encounter Medications as of 01/20/2021  Medication Sig   acetaminophen (TYLENOL) 500 MG tablet Take 1,000 mg by mouth every 6 (six) hours. Rapid release   aspirin 81 MG tablet Take 81 mg by mouth daily.   atorvastatin (LIPITOR) 20 MG tablet TAKE 1 TABLET BY MOUTH EVERY EVENING FOR CHOLESTEROL   buPROPion (WELLBUTRIN SR) 150 MG 12 hr tablet One tab daily for 5 days, twice a day starting on day 6.   ferrous gluconate (FERGON) 324 MG tablet TAKE 1 TABLET BY MOUTH EVERY DAY WITH BREAKFAST   finasteride (PROSCAR) 5 MG tablet TAKE 1 TABLET BY MOUTH EVERY DAY   glipiZIDE (GLUCOTROL) 10 MG tablet Take 10 mg by mouth daily before supper.    GLUCOSAMINE HCL PO Take 1 tablet by mouth daily.   insulin detemir (LEVEMIR) 100 UNIT/ML injection Inject 20 units at bedtime. (Patient taking differently: Inject 15 Units into the skin daily.)   lisinopril-hydrochlorothiazide (ZESTORETIC) 20-25 MG tablet Take 1 tablet by mouth daily. For blood pressure. Office visit required for further refills.   metFORMIN (GLUCOPHAGE) 500 MG tablet TAKE 2 TABLETS BY MOUTH TWICE A DAY WITH MEAL FOR DIABETES   metoprolol tartrate (LOPRESSOR) 50 MG tablet TAKE 1/2 TABLET BY MOUTH TWICE A DAY FOR BLOOD PRESSURE. OFFICE VISIT REQUIRED.  pantoprazole (PROTONIX) 40 MG tablet Take 1 tablet (40 mg total) by mouth daily. For heartburn.   tamsulosin (FLOMAX) 0.4 MG CAPS capsule Take 1 capsule (0.4 mg total) by mouth 2 (two) times daily.   tiZANidine (ZANAFLEX) 4 MG tablet TAKE 1 TABLET (4 MG TOTAL) BY MOUTH 2 (TWO) TIMES DAILY AS NEEDED FOR MUSCLE SPASMS.   triamcinolone cream  (KENALOG) 0.5 % Apply 1 application topically 2 (two) times daily.   vitamin B-12 (CYANOCOBALAMIN) 1000 MCG tablet Take 1,000 mcg by mouth daily.   amLODipine (NORVASC) 5 MG tablet Take 5 mg by mouth daily.   ELIQUIS 5 MG TABS tablet TAKE 1 TABLET BY MOUTH TWICE A DAY (Patient not taking: Reported on 01/20/2021)   [DISCONTINUED] metoprolol tartrate (LOPRESSOR) 50 MG tablet TAKE 1/2 TABLET BY MOUTH TWICE A DAY for blood pressure. Office visit required for further refills.   No facility-administered encounter medications on file as of 01/20/2021.    Allergies (verified) Patient has no known allergies.   History: Past Medical History:  Diagnosis Date   Arthritis    Asthma    age 36   BPH with urinary obstruction    stable on flomax ([name]   Coronary atherosclerosis of unspecified type of vessel, native or graft    Diabetes mellitus    Type II   Emphysema of lung (HCC)    Esophageal stricture    Gastritis    GERD (gastroesophageal reflux disease)    Hiatal hernia    Hx of colonic polyps    Hyperlipidemia    Hypertension    Internal hemorrhoids without mention of complication    Lumbago    Pain in both lower legs    PSA elevation    now averaging 2's   Tobacco abuse    URI (upper respiratory infection)    Urinary retention    Past Surgical History:  Procedure Laterality Date   2D Echo  06/03   Abdominal ultrasound  06/03   Negative   BACK SURGERY     COLONOSCOPY     CT of head  and sinuses  06/03   Negative   CT of the chest, abdomen, pelvis  06/03   Chest negative; Abdomen, left adrenal lesion; Pelvis, enlarged prostate   ESOPHAGOGASTRODUODENOSCOPY  02/13/07   gastritis and duodenitis without bleed   ESOPHAGOGASTRODUODENOSCOPY N/A 06/26/2014   Procedure: ESOPHAGOGASTRODUODENOSCOPY (EGD);  Surgeon: Milus Banister, MD;  Location: [facility];  Service: Endoscopy;  Laterality: N/A;   ESOPHAGOGASTRODUODENOSCOPY (EGD) WITH PROPOFOL N/A 04/08/2020   Procedure:  ESOPHAGOGASTRODUODENOSCOPY (EGD) WITH PROPOFOL;  Surgeon: Ladene Artist, MD;  Location: [facility];  Service: Endoscopy;  Laterality: N/A;   HOT HEMOSTASIS N/A 04/08/2020   Procedure: HOT HEMOSTASIS (ARGON PLASMA COAGULATION/BICAP);  Surgeon: Ladene Artist, MD;  Location: [facility];  Service: Endoscopy;  Laterality: N/A;   KNEE ARTHROSCOPY  10/00   Right   LOWER EXTREMITY ANGIOGRAPHY Right 12/27/2017   Procedure: LOWER EXTREMITY ANGIOGRAPHY;  Surgeon: Algernon Huxley, MD;  Location: [facility];  Service: Cardiovascular;  Laterality: Right;   LOWER EXTREMITY ANGIOGRAPHY Left 01/10/2018   Procedure: LOWER EXTREMITY ANGIOGRAPHY;  Surgeon: Algernon Huxley, MD;  Location: [facility];  Service: Cardiovascular;  Laterality: Left;   LOWER EXTREMITY ANGIOGRAPHY Left 05/29/2018   Procedure: LOWER EXTREMITY ANGIOGRAPHY;  Surgeon: Algernon Huxley, MD;  Location: [facility];  Service: Cardiovascular;  Laterality: Left;   LOWER EXTREMITY ANGIOGRAPHY Left 05/30/2018   Procedure: Lower  Extremity Angiography;  Surgeon: Algernon Huxley, MD;  Location: [facility];  Service: Cardiovascular;  Laterality: Left;   LP  06/03   Microwave thermotherapy prostate  08/28/07   [place]   [place] cardiolite  03/01   EF 68%   POLYPECTOMY  11/95   Stress myoview  07/06   EF 67%   UPPER GASTROINTESTINAL ENDOSCOPY     Family History  Problem Relation Age of Onset   Diabetes Father    Alcohol abuse Paternal Uncle    Alcohol abuse Paternal Uncle    Alcohol abuse Paternal Uncle    Rheum arthritis Mother    Heart disease Neg Hx    Stroke Neg Hx    Cancer Neg Hx    Drug abuse Neg Hx    Depression Neg Hx    Colon cancer Neg Hx    Rectal cancer Neg Hx    Stomach cancer Neg Hx    Kidney cancer Neg Hx    Bladder Cancer Neg Hx    Prostate cancer Neg Hx    Social History   Socioeconomic History   Marital status: Widowed    Spouse name: [name]   Number of children: 3   Years of  education: Not on file   Highest education level: Not on file  Occupational History   Occupation: Retired Games developer  Tobacco Use   Smoking status: Every Day    Packs/day: 1.50    Years: 57.00    Pack years: 85.50    Types: Cigarettes   Smokeless tobacco: Former    Types: Chew   Tobacco comments:    ready to quit wants to talk to [place] at appointment  Vaping Use   Vaping Use: Never used  Substance and Sexual Activity   Alcohol use: Not Currently    Alcohol/week: 0.0 standard drinks    Comment: rare   Drug use: No   Sexual activity: Yes  Other Topics Concern   Not on file  Social History Narrative   Married 9/09 wife with moderate memory problems.   3 adult children, 2 grandchildren   Would desire CPR   Social Determinants of Health   Financial Resource Strain: High Risk   Difficulty of Paying Living Expenses: Hard  Food Insecurity: No Food Insecurity   Worried About Charity fundraiser in the Last Year: Never true   Ran Out of Food in the Last Year: Never true  Transportation Needs: No Transportation Needs   Lack of Transportation (Medical): No   Lack of Transportation (Non-Medical): No  Physical Activity: Inactive   Days of Exercise per Week: 0 days   Minutes of Exercise per Session: 0 min  Stress: No Stress Concern Present   Feeling of Stress : Not at all  Social Connections: Not on file    Tobacco Counseling Ready to quit: Not Answered Counseling given: Not Answered Tobacco comments: ready to quit wants to talk to Anda Kraft at appointment   Clinical Intake:  Pre-visit preparation completed: Yes  Pain : No/denies pain     BMI - recorded: 26.32 Nutritional Status: BMI 25 -29 Overweight Nutritional Risks: None Diabetes: Yes CBG done?: No (visit completed over the phone) Did pt. bring in CBG monitor from home?: No  How often do you need to have someone help you when you read instructions, pamphlets, or other written materials from your doctor or pharmacy?:  1 - Never  Diabetes:  Is the patient diabetic?  Yes  If diabetic, was  a CBG obtained today?  No , visit completed over the phone.  Did the patient bring in their glucometer from home?  No , visit completed over the phone.  How often do you monitor your CBG's? Every other day.   Financial Strains and Diabetes Management:  Are you having any financial strains with the device, your supplies or your medication? No .  Does the patient want to be seen by Chronic Care Management for management of their diabetes?  No  Would the patient like to be referred to a Nutritionist or for Diabetic Management?  No   Diabetic Exams:  Diabetic Eye Exam: Completed 06/03/20.   Diabetic Foot Exam:  Pt has an upcoming appointment.     Interpreter Needed?: No  Information entered by :: Orrin Brigham LPN   Activities of Daily Living In your present state of health, do you have any difficulty performing the following activities: 01/20/2021  Hearing? Y  Comment decrease in hearing  Vision? N  Difficulty concentrating or making decisions? N  Walking or climbing stairs? Y  Comment makes hip hurt  Dressing or bathing? N  Doing errands, shopping? N  Preparing Food and eating ? N  Using the Toilet? N  In the past six months, have you accidently leaked urine? N  Do you have problems with loss of bowel control? N  Managing your Medications? Y  Comment Eliquis is expensive  Managing your Finances? N  Housekeeping or managing your Housekeeping? N  Some recent data might be hidden    Patient Care Team: Pleas Koch, NP as PCP - General (Internal Medicine) Nori Riis PA-C as Physician Assistant (Urology) Ladene Artist, MD as Consulting Physician (Gastroenterology) [name], [facility] as Pharmacist (Pharmacist)  Indicate any recent Medical Services you may have received from other than [facility] providers in the past year (date may be approximate).     Assessment:   This is a routine  wellness examination for Kmari.  Hearing/Vision screen Hearing Screening - Comments:: Decrease in hearing Vision Screening - Comments:: Last exam 12/2020, Dr. Georgette Dover  Dietary issues and exercise activities discussed: Current Exercise Habits: The patient has a physically strenuous job, but has no regular exercise apart from work.   Goals Addressed             This Visit's Progress    Patient Stated       Would like to maintain current routine       Depression Screen PHQ 2/9 Scores 01/20/2021 12/02/2019 09/23/2018 09/14/2017 07/12/2016 04/20/2015  PHQ - 2 Score 0 3 0 0 0 0  PHQ- 9 Score - 6 6 0 - -    Fall Risk Fall Risk  01/20/2021 10/17/2019 01/01/2019 09/23/2018 09/14/2017  Falls in the past year? 1 1 0 0 Yes  Comment - - Emmi Telephone Survey: data to providers prior to load - pt tripped and fell; pt fell while trying to help wife get up  Number falls in past yr: 1 - - - 2 or more  Injury with Fall? 0 - - - Yes  Risk for fall due to : - - - Medication side effect History of fall(s)  Follow up Falls prevention discussed - - Falls evaluation completed;Falls prevention discussed -    FALL RISK PREVENTION PERTAINING TO THE HOME:  Any stairs in or around the home? No  If so, are there any without handrails?  N/A Home free of loose throw rugs in walkways, pet beds, electrical  cords, etc? Yes  Adequate lighting in your home to reduce risk of falls? Yes   ASSISTIVE DEVICES UTILIZED TO PREVENT FALLS:  Life alert? No  Use of a cane, walker or w/c? Yes , cane occasional Grab bars in the bathroom? No  Shower chair or bench in shower? No  Elevated toilet seat or a handicapped toilet? No   TIMED UP AND GO:  Was the test performed? No , visit completed over the phone.    Cognitive Function: Normal cognitive status assessed by this Nurse Health Advisor. No abnormalities found.   MMSE - Mini Mental State Exam 09/23/2018 09/14/2017 07/12/2016  Orientation to time 5 5 5   Orientation  to Place 5 5 5   Registration 3 3 3   Attention/ Calculation 5 0 0  Recall 3 3 3   Language- name 2 objects 0 0 0  Language- repeat 0 1 1  Language- follow 3 step command 0 3 3  Language- read & follow direction 0 0 0  Write a sentence 0 0 0  Copy design 0 0 0  Total score 21 20 20         Immunizations Immunization History  Administered Date(s) Administered   Moderna Sars-Covid-2 Vaccination 07/09/2019, 08/06/2019   Pneumococcal Conjugate-13 09/30/2018   Td 10/08/1994, 11/19/2006    TDAP status: Due, Education has been provided regarding the importance of this vaccine. Advised may receive this vaccine at local pharmacy or Health Dept. Aware to provide a copy of the vaccination record if obtained from local pharmacy or Health Dept. Verbalized acceptance and understanding.  Flu Vaccine status: Due, Education has been provided regarding the importance of this vaccine. Advised may receive this vaccine at local pharmacy or Health Dept. Aware to provide a copy of the vaccination record if obtained from local pharmacy or Health Dept. Verbalized acceptance and understanding.  Pneumococcal vaccine status: Due, Education has been provided regarding the importance of this vaccine. Advised may receive this vaccine at local pharmacy or Health Dept. Aware to provide a copy of the vaccination record if obtained from local pharmacy or Health Dept. Verbalized acceptance and understanding.  Covid-19 vaccine status: Information provided on how to obtain vaccines.   Qualifies for Shingles Vaccine? Yes   Zostavax completed No   Shingrix Completed?: No.    Education has been provided regarding the importance of this vaccine. Patient has been advised to call insurance company to determine out of pocket expense if they have not yet received this vaccine. Advised may also receive vaccine at local pharmacy or Health Dept. Verbalized acceptance and understanding.  Screening Tests Health Maintenance  Topic  Date Due   Zoster Vaccines- Shingrix (1 of 2) Never done   TETANUS/TDAP  11/18/2016   FOOT EXAM  05/16/2019   Pneumonia Vaccine 66+ Years old (2 - PPSV23 if available, else PCV20) 09/30/2019   COVID-19 Vaccine (3 - Booster for Moderna series) 10/01/2019   HEMOGLOBIN A1C  06/01/2020   INFLUENZA VACCINE  01/21/2057 (Originally 09/06/2020)   OPHTHALMOLOGY EXAM  06/03/2021   COLONOSCOPY (Pts 45-39yrs Insurance coverage will need to be confirmed)  12/11/2022   Hepatitis C Screening  Completed   HPV VACCINES  Aged Out    Health Maintenance  Health Maintenance Due  Topic Date Due   Zoster Vaccines- Shingrix (1 of 2) Never done   TETANUS/TDAP  11/18/2016   FOOT EXAM  05/16/2019   Pneumonia Vaccine 63+ Years old (2 - PPSV23 if available, else PCV20) 09/30/2019   COVID-19 Vaccine (3 -  Booster for Moderna series) 10/01/2019   HEMOGLOBIN A1C  06/01/2020    Colorectal cancer screening: Type of screening: Colonoscopy. Completed 12/11/19. Repeat every 3 years  Lung Cancer Screening: (Low Dose CT Chest recommended if Age 65-80 years, 30 pack-year currently smoking OR have quit w/in 15years.) does qualify, Patient has an upcoming appointment with PCP.     Additional Screening:  Hepatitis C Screening: does qualify; Completed 07/12/16  Vision Screening: Recommended annual ophthalmology exams for early detection of glaucoma and other disorders of the eye. Is the patient up to date with their annual eye exam?  Yes  Who is the provider or what is the name of the office in which the patient attends annual eye exams? Dr. Percell [place]   Dental Screening: Recommended annual dental exams for proper oral hygiene  Community Resource Referral / Chronic Care Management: CRR required this visit?  Yes   CCM required this visit?  No      Plan:     I have personally reviewed and noted the following in the patients chart:   Medical and social history Use of alcohol, tobacco or illicit drugs  Current  medications and supplements including opioid prescriptions. Patient is not currently taking opioid prescriptions. Functional ability and status Nutritional status Physical activity Advanced directives List of other physicians Hospitalizations, surgeries, and ER visits in previous 12 months Vitals Screenings to include cognitive, depression, and falls Referrals and appointments  In addition, I have reviewed and discussed with patient certain preventive protocols, quality metrics, and best practice recommendations. A written personalized care plan for preventive services as well as general preventive health recommendations were provided to patient.   Due to this being a telephonic visit, the after visit summary with patients personalized plan was offered to patient via mail or my-chart.  Patient would like to access on my-chart.     Loma Messing, LPN   [id number]/83/[id number]   Nurse Health Advisor  Nurse Notes: none

## 2021-01-19 ENCOUNTER — Ambulatory Visit: Payer: Medicare Other | Admitting: Urology

## 2021-01-19 ENCOUNTER — Encounter: Payer: Self-pay | Admitting: Urology

## 2021-01-19 DIAGNOSIS — N138 Other obstructive and reflux uropathy: Secondary | ICD-10-CM

## 2021-01-20 ENCOUNTER — Ambulatory Visit (INDEPENDENT_AMBULATORY_CARE_PROVIDER_SITE_OTHER): Payer: Medicare Other

## 2021-01-20 VITALS — Ht 66.0 in | Wt 163.0 lb

## 2021-01-20 DIAGNOSIS — Z0000 Encounter for general adult medical examination without abnormal findings: Secondary | ICD-10-CM

## 2021-01-20 NOTE — Patient Instructions (Signed)
[name] , Thank you for taking time to complete your Medicare Wellness Visit. I appreciate your ongoing commitment to your health goals. Please review the following plan we discussed and let me know if I can assist you in the future.   Screening recommendations/referrals: Colonoscopy: no longer required Recommended yearly ophthalmology/optometry visit for glaucoma screening and checkup Recommended yearly dental visit for hygiene and checkup  Vaccinations: Influenza vaccine: Due-May obtain vaccine at our office or your local pharmacy. Pneumococcal vaccine: up to date Tdap vaccine: Due last completed 11/18/16, -May obtain vaccine at your local pharmacy. Shingles vaccine: Discuss with your local pharmacy Covid-19:  newest booster available at your local pharmacy  Advanced directives: copy on file   Conditions/risks identified: see problem list  Next appointment: Follow up in one year for your annual wellness visit. 01/24/22 @ 8:15am, this will be a telephone visit.   Preventive Care 25 Years and Older, Male Preventive care refers to lifestyle choices and visits with your health care provider that can promote health and wellness. What does preventive care include? A yearly physical exam. This is also called an annual well check. Dental exams once or twice a year. Routine eye exams. Ask your health care provider how often you should have your eyes checked. Personal lifestyle choices, including: Daily care of your teeth and gums. Regular physical activity. Eating a healthy diet. Avoiding tobacco and drug use. Limiting alcohol use. Practicing safe sex. Taking low doses of aspirin every day. Taking vitamin and mineral supplements as recommended by your health care provider. What happens during an annual well check? The services and screenings done by your health care provider during your annual well check will depend on your age, overall health, lifestyle risk factors, and family  history of disease. Counseling  Your health care provider may ask you questions about your: Alcohol use. Tobacco use. Drug use. Emotional well-being. Home and relationship well-being. Sexual activity. Eating habits. History of falls. Memory and ability to understand (cognition). Work and work Statistician. Screening  You may have the following tests or measurements: Height, weight, and BMI. Blood pressure. Lipid and cholesterol levels. These may be checked every 5 years, or more frequently if you are over 50 years old. Skin check. Lung cancer screening. You may have this screening every year starting at age 45 if you have a 30-pack-year history of smoking and currently smoke or have quit within the past 15 years. Fecal occult blood test (FOBT) of the stool. You may have this test every year starting at age 50. Flexible sigmoidoscopy or colonoscopy. You may have a sigmoidoscopy every 5 years or a colonoscopy every 10 years starting at age 36. Prostate cancer screening. Recommendations will vary depending on your family history and other risks. Hepatitis C blood test. Hepatitis B blood test. Sexually transmitted disease (STD) testing. Diabetes screening. This is done by checking your blood sugar (glucose) after you have not eaten for a while (fasting). You may have this done every 1-3 years. Abdominal aortic aneurysm (AAA) screening. You may need this if you are a current or former smoker. Osteoporosis. You may be screened starting at age 74 if you are at high risk. Talk with your health care provider about your test results, treatment options, and if necessary, the need for more tests. Vaccines  Your health care provider may recommend certain vaccines, such as: Influenza vaccine. This is recommended every year. Tetanus, diphtheria, and acellular pertussis (Tdap, Td) vaccine. You may need a Td booster every 10  years. Zoster vaccine. You may need this after age 29. Pneumococcal  13-valent conjugate (PCV13) vaccine. One dose is recommended after age 59. Pneumococcal polysaccharide (PPSV23) vaccine. One dose is recommended after age 6. Talk to your health care provider about which screenings and vaccines you need and how often you need them. This information is not intended to replace advice given to you by your health care provider. Make sure you discuss any questions you have with your health care provider. Document Released: 02/19/2015 Document Revised: 10/13/2015 Document Reviewed: 11/24/2014 Elsevier Interactive Patient Education  2017 [employer] Prevention in the Home Falls can cause injuries. They can happen to people of all ages. There are many things you can do to make your home safe and to help prevent falls. What can I do on the outside of my home? Regularly fix the edges of walkways and driveways and fix any cracks. Remove anything that might make you trip as you walk through a door, such as a raised step or threshold. Trim any bushes or trees on the path to your home. Use bright outdoor lighting. Clear any walking paths of anything that might make someone trip, such as rocks or tools. Regularly check to see if handrails are loose or broken. Make sure that both sides of any steps have handrails. Any raised decks and porches should have guardrails on the edges. Have any leaves, snow, or ice cleared regularly. Use sand or salt on walking paths during winter. Clean up any spills in your garage right away. This includes oil or grease spills. What can I do in the bathroom? Use night lights. Install grab bars by the toilet and in the tub and shower. Do not use towel bars as grab bars. Use non-skid mats or decals in the tub or shower. If you need to sit down in the shower, use a plastic, non-slip stool. Keep the floor dry. Clean up any water that spills on the floor as soon as it happens. Remove soap buildup in the tub or shower regularly. Attach  bath mats securely with double-sided non-slip rug tape. Do not have throw rugs and other things on the floor that can make you trip. What can I do in the bedroom? Use night lights. Make sure that you have a light by your bed that is easy to reach. Do not use any sheets or blankets that are too big for your bed. They should not hang down onto the floor. Have a firm chair that has side arms. You can use this for support while you get dressed. Do not have throw rugs and other things on the floor that can make you trip. What can I do in the kitchen? Clean up any spills right away. Avoid walking on wet floors. Keep items that you use a lot in easy-to-reach places. If you need to reach something above you, use a strong step stool that has a grab bar. Keep electrical cords out of the way. Do not use floor polish or wax that makes floors slippery. If you must use wax, use non-skid floor wax. Do not have throw rugs and other things on the floor that can make you trip. What can I do with my stairs? Do not leave any items on the stairs. Make sure that there are handrails on both sides of the stairs and use them. Fix handrails that are broken or loose. Make sure that handrails are as long as the stairways. Check any carpeting to make sure  that it is firmly attached to the stairs. Fix any carpet that is loose or worn. Avoid having throw rugs at the top or bottom of the stairs. If you do have throw rugs, attach them to the floor with carpet tape. Make sure that you have a light switch at the top of the stairs and the bottom of the stairs. If you do not have them, ask someone to add them for you. What else can I do to help prevent falls? Wear shoes that: Do not have high heels. Have rubber bottoms. Are comfortable and fit you well. Are closed at the toe. Do not wear sandals. If you use a stepladder: Make sure that it is fully opened. Do not climb a closed stepladder. Make sure that both sides of the  stepladder are locked into place. Ask someone to hold it for you, if possible. Clearly mark and make sure that you can see: Any grab bars or handrails. First and last steps. Where the edge of each step is. Use tools that help you move around (mobility aids) if they are needed. These include: Canes. Walkers. Scooters. Crutches. Turn on the lights when you go into a dark area. Replace any light bulbs as soon as they burn out. Set up your furniture so you have a clear path. Avoid moving your furniture around. If any of your floors are uneven, fix them. If there are any pets around you, be aware of where they are. Review your medicines with your doctor. Some medicines can make you feel dizzy. This can increase your chance of falling. Ask your doctor what other things that you can do to help prevent falls. This information is not intended to replace advice given to you by your health care provider. Make sure you discuss any questions you have with your health care provider. Document Released: 11/19/2008 Document Revised: 07/01/2015 Document Reviewed: 02/27/2014 Elsevier Interactive Patient Education  2017 [employer].

## 2021-01-22 ENCOUNTER — Other Ambulatory Visit: Payer: Self-pay | Admitting: Primary Care

## 2021-01-22 DIAGNOSIS — E782 Mixed hyperlipidemia: Secondary | ICD-10-CM

## 2021-02-01 ENCOUNTER — Telehealth (INDEPENDENT_AMBULATORY_CARE_PROVIDER_SITE_OTHER): Payer: Self-pay

## 2021-02-01 NOTE — Telephone Encounter (Signed)
Patient called in yesterday lvm on cell phone, he is taking eliquis but it is becoming too expensive for him. Antonio [name] is having to pay $149 for a 30 day supply. Wanting to know if there is something cheaper.

## 2021-02-01 NOTE — Telephone Encounter (Signed)
Spoke with the patient and gave him the recommendation from Eulogio Ditch NP, per Arna Medici the patient needs to call his insurance to find out if they will cover Xarelto for a more cost friendly alternative to Eliquis. Patient understood and will let us know if it will be more cost effective for him.

## 2021-02-02 ENCOUNTER — Other Ambulatory Visit (INDEPENDENT_AMBULATORY_CARE_PROVIDER_SITE_OTHER): Payer: Self-pay | Admitting: Nurse Practitioner

## 2021-02-02 MED ORDER — RIVAROXABAN 20 MG PO TABS: 20.0000 mg | ORAL_TABLET | Freq: Every day | ORAL | 5 refills | Status: DC

## 2021-02-02 NOTE — Telephone Encounter (Signed)
Patient is calling back about the update from insurance for the medication Xarelto, would like a call back.

## 2021-02-02 NOTE — Telephone Encounter (Signed)
sent 

## 2021-02-08 NOTE — Telephone Encounter (Signed)
Wellbutrin is not a blood thinner.  That wont help any of his vascular status

## 2021-02-08 NOTE — Telephone Encounter (Signed)
Patient called in stating that the Xarelto is only $3 cheaper, after speaking with insurance they will cover Wellbutrin w/ sodium.

## 2021-02-08 NOTE — Telephone Encounter (Signed)
Patient will contact his insurance to see the cost for other blood thinners and will return call back to the office

## 2021-02-09 ENCOUNTER — Other Ambulatory Visit (INDEPENDENT_AMBULATORY_CARE_PROVIDER_SITE_OTHER): Payer: Self-pay | Admitting: Nurse Practitioner

## 2021-02-09 MED ORDER — CLOPIDOGREL BISULFATE 75 MG PO TABS: 75.0000 mg | ORAL_TABLET | Freq: Every day | ORAL | 6 refills | Status: DC

## 2021-02-09 NOTE — Telephone Encounter (Signed)
Patient has been made aware with medical advice and verbalized understanding

## 2021-02-09 NOTE — Telephone Encounter (Signed)
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  I would suggest finishing the xarelto he has and then he can switch to plavix. I have sent in the refill

## 2021-02-09 NOTE — Telephone Encounter (Signed)
Patient has contacted insurance and was informed that Clopidogrel is covered also. The patient informed that Xarelto will be $138 monthly

## 2021-02-09 NOTE — Telephone Encounter (Signed)
Patient called in stating the insurance will cover the medication.

## 2021-03-03 ENCOUNTER — Other Ambulatory Visit: Payer: Self-pay | Admitting: Gastroenterology

## 2021-03-03 DIAGNOSIS — K2990 Gastroduodenitis, unspecified, without bleeding: Secondary | ICD-10-CM

## 2021-03-15 ENCOUNTER — Ambulatory Visit: Payer: Medicare Other | Admitting: Primary Care

## 2021-03-23 ENCOUNTER — Telehealth: Payer: Self-pay

## 2021-03-23 NOTE — Progress Notes (Signed)
° ° °  Chronic Care Management Pharmacy Assistant   Name: Antonio [name]  MRN: [id number] DOB: 1945-05-23  Reason for Encounter: CCM (Appointment Reminder)   Medications: Outpatient Encounter Medications as of 03/23/2021  Medication Sig Note   acetaminophen (TYLENOL) 500 MG tablet Take 1,000 mg by mouth every 6 (six) hours. Rapid release    amLODipine (NORVASC) 5 MG tablet Take 5 mg by mouth daily.    aspirin 81 MG tablet Take 81 mg by mouth daily.    atorvastatin (LIPITOR) 20 MG tablet TAKE 1 TABLET BY MOUTH EVERY DAY IN THE EVENING FOR CHOLESTEROL. Office visit required for further refills.    buPROPion (WELLBUTRIN SR) 150 MG 12 hr tablet One tab daily for 5 days, twice a day starting on day 6. 02/16/2020: Have not started   clopidogrel (PLAVIX) 75 MG tablet Take 1 tablet (75 mg total) by mouth daily.    ferrous gluconate (FERGON) 324 MG tablet TAKE 1 TABLET BY MOUTH EVERY DAY WITH BREAKFAST    finasteride (PROSCAR) 5 MG tablet TAKE 1 TABLET BY MOUTH EVERY DAY    glipiZIDE (GLUCOTROL) 10 MG tablet Take 10 mg by mouth daily before supper.     GLUCOSAMINE HCL PO Take 1 tablet by mouth daily.    insulin detemir (LEVEMIR) 100 UNIT/ML injection Inject 20 units at bedtime. (Patient taking differently: Inject 15 Units into the skin daily.) 04/08/2020: Took half a dose    lisinopril-hydrochlorothiazide (ZESTORETIC) 20-25 MG tablet Take 1 tablet by mouth daily. For blood pressure. Office visit required for further refills.    metFORMIN (GLUCOPHAGE) 500 MG tablet TAKE 2 TABLETS BY MOUTH TWICE A DAY WITH MEAL FOR DIABETES    metoprolol tartrate (LOPRESSOR) 50 MG tablet TAKE 1/2 TABLET BY MOUTH TWICE A DAY FOR BLOOD PRESSURE. OFFICE VISIT REQUIRED.    pantoprazole (PROTONIX) 40 MG tablet Take 1 tablet (40 mg total) by mouth daily. For heartburn.    tamsulosin (FLOMAX) 0.4 MG CAPS capsule Take 1 capsule (0.4 mg total) by mouth 2 (two) times daily.    tiZANidine (ZANAFLEX) 4 MG tablet TAKE 1 TABLET (4 MG  TOTAL) BY MOUTH 2 (TWO) TIMES DAILY AS NEEDED FOR MUSCLE SPASMS.    triamcinolone cream (KENALOG) 0.5 % Apply 1 application topically 2 (two) times daily.    vitamin B-12 (CYANOCOBALAMIN) 1000 MCG tablet Take 1,000 mcg by mouth daily.    No facility-administered encounter medications on file as of 03/23/2021.   [name] was contacted to remind of upcoming telephone visit with [name] on 03/28/2021 at 11:30 am. Patient was reminded to have all medications, supplements and any blood glucose and blood pressure readings available for review at appointment. If unable to reach, a voicemail was left for patient.   Are you having any problems with your medications? No   Do you have any concerns you like to discuss with the pharmacist? No  Star Rating Drugs: Medication:   Last Fill: Day Supply Atorvastatin 20 mg  01/24/2021 90 Lisinopril HCTZ 20-25 mg 01/06/2021 90 Metformin 500 mg  02/11/2021 90 Glipizide 10 mg  01/14/2021 [place], C[facility] notified  Marijean Niemann, [place] Pharmacy Assistant [phone]  Time Spent: 10 Minutes

## 2021-03-28 ENCOUNTER — Other Ambulatory Visit: Payer: Self-pay

## 2021-03-28 ENCOUNTER — Other Ambulatory Visit: Payer: Self-pay | Admitting: *Deleted

## 2021-03-28 ENCOUNTER — Ambulatory Visit (INDEPENDENT_AMBULATORY_CARE_PROVIDER_SITE_OTHER): Payer: Medicare Other

## 2021-03-28 DIAGNOSIS — I10 Essential (primary) hypertension: Secondary | ICD-10-CM

## 2021-03-28 DIAGNOSIS — E11618 Type 2 diabetes mellitus with other diabetic arthropathy: Secondary | ICD-10-CM

## 2021-03-28 DIAGNOSIS — F17210 Nicotine dependence, cigarettes, uncomplicated: Secondary | ICD-10-CM

## 2021-03-28 DIAGNOSIS — Z87891 Personal history of nicotine dependence: Secondary | ICD-10-CM

## 2021-03-28 DIAGNOSIS — Z720 Tobacco use: Secondary | ICD-10-CM

## 2021-03-28 NOTE — Progress Notes (Signed)
Chronic Care Management Pharmacy Note  03/28/2021 Name:  Antonio [name] MRN:  [id number] DOB:  October 20, 1945  Summary: CCM 6 month follow up visit. Discussed adherence - All refills are timely. He is using a pillbox. No cost concerns. Discussed HTN. He monitors at home on occasion. Last readings - 118/51, 136/58. Uses wrist cuff. DM, controlled A1c 6.8% followed by Dr. Gabriel Carina. Tobacco use, he reports having cut back to < 1/2 PPD. Congratulated. He continues Zyban BID. He is due for AWV. He will call this week to schedule with PCP.  Recommendation: Schedule follow up with PCP   Plan: CCM PharmD follow up - 12 months [place] adherence review - 3 months  Subjective: Antonio [name] is an 76 y.o. year old male who is a primary patient of Pleas Koch, NP.  The CCM team was consulted for assistance with disease management and care coordination needs.    Engaged with patient by telephone for follow up visit in response to provider referral for pharmacy case management and/or care coordination services.   Consent to Services:  The patient was given information about Chronic Care Management services, agreed to services, and gave verbal consent prior to initiation of services.  Please see initial visit note for detailed documentation.   Patient Care Team: Pleas Koch, NP as PCP - General (Internal Medicine) Laneta Simmers as Physician Assistant (Urology) Ladene Artist, MD as Consulting Physician (Gastroenterology) [name], [facility] as Pharmacist (Pharmacist)  Recent office visits: 06/14/20 - Alma Friendly, NP, PCP - Chronic right hip pain -  Continue Tylenol PRN. IM Depo Medrol 80 mg provided for joint inflammation. Rx for Tizanidine 4 mg provided to use PRN, discussed drowsiness side effect.   Recent consult visits: 02/01/21 - Vascular Surgery, telephone - Complete Eliquis, then switch to Plavix. 11/17/20 - Lucilla Lame, MD, Endocrinology - A1c 6.8%  Continue Levemir, glipizide and metformin as prescribed. Encouraged smoking cessation. Follow up 6 months.  Hospital visits: None in previous 6 months   Objective:  Lab Results  Component Value Date   CREATININE 1.19 12/02/2019   BUN 26 (H) 12/02/2019   GFR 59.99 (L) 12/02/2019   GFRNONAA >60 05/31/2018   GFRAA >60 05/31/2018   NA 142 12/02/2019   K 5.2 (H) 12/02/2019   CALCIUM 8.9 12/02/2019   CO2 21 12/02/2019   GLUCOSE 74 12/02/2019    Lab Results  Component Value Date/Time   HGBA1C 6.8 (A) 12/02/2019 08:32 AM   HGBA1C 7.6 (H) 09/24/2018 08:19 AM   HGBA1C 6.8 (A) 09/14/2017 08:30 AM   HGBA1C 7.6 03/16/2017 12:00 AM   HGBA1C 8.9 (H) 07/12/2016 09:43 AM   GFR 59.99 (L) 12/02/2019 08:53 AM   GFR 63.48 09/24/2018 08:19 AM   MICROALBUR 6.8 (H) 12/23/2014 08:08 AM   MICROALBUR 1.3 11/12/2007 09:24 AM    Last diabetic Eye exam:  Lab Results  Component Value Date/Time   HMDIABEYEEXA Retinopathy (A) 06/03/2020 12:00 AM    Last diabetic Foot exam:  05/12/20, no abnormalities  Lab Results  Component Value Date   CHOL 102 12/02/2019   HDL 38.50 (L) 12/02/2019   LDLCALC 37 12/02/2019   LDLDIRECT 120.3 04/24/2011   TRIG 131.0 12/02/2019   CHOLHDL 3 12/02/2019    Hepatic Function Latest Ref Rng & Units 12/02/2019 09/24/2018 06/27/2017  Total Protein 6.0 - 8.3 g/dL 6.2 6.6 6.5  Albumin 3.5 - 5.2 g/dL 4.1 4.2 3.9  AST 0 - 37 U/L 11 9  9  ALT 0 - 53 U/L 9 9 9   Alk Phosphatase 39 - 117 U/L 42 52 61  Total Bilirubin 0.2 - 1.2 mg/dL 0.3 0.4 0.4  Bilirubin, Direct 0.0 - 0.3 mg/dL - - -    Lab Results  Component Value Date/Time   TSH 1.11 09/14/2017 08:30 AM   TSH 1.32 07/12/2016 09:43 AM    CBC Latest Ref Rng & Units 12/08/2019 12/02/2019 09/24/2018  WBC 4.0 - 10.5 K/uL 9.2 9.3 10.4  Hemoglobin 13.0 - 17.0 g/dL 8.5 Repeated and verified X2.(L) 8.3 Repeated and verified X2.(L) 13.5  Hematocrit 39.0 - 52.0 % 26.1 Repeated and verified X2.(L) 26.2 Repeated and verified X2.(L)  40.3  Platelets 150.0 - 400.0 K/uL 292.0 281.0 254.0    No results found for: VD25OH  Clinical ASCVD: Yes  The ASCVD Risk score (Arnett DK, et al., 2019) failed to calculate for the following reasons:   The valid total cholesterol range is 130 to 320 mg/dL    Depression screen [facility] 2/9 01/20/2021 12/02/2019 09/23/2018  Decreased Interest 0 3 0  Down, Depressed, Hopeless 0 0 0  PHQ - 2 Score 0 3 0  Altered sleeping - 0 3  Tired, decreased energy - 3 3  Change in appetite - 0 0  Feeling bad or failure about yourself  - 0 0  Trouble concentrating - 0 0  Moving slowly or fidgety/restless - 0 0  Suicidal thoughts - 0 0  PHQ-9 Score - 6 6  Difficult doing work/chores - Not difficult at all Not difficult at all  Some recent data might be hidden    Social History   Tobacco Use  Smoking Status Every Day   Packs/day: 0.50   Years: 57.00   Pack years: 28.50   Types: Cigarettes  Smokeless Tobacco Former   Types: Chew   BP Readings from Last 3 Encounters:  07/14/20 (!) 130/58  06/14/20 138/72  05/18/20 (!) 124/56   Pulse Readings from Last 3 Encounters:  06/14/20 (!) 53  05/18/20 71  04/08/20 (!) 57   Wt Readings from Last 3 Encounters:  01/20/21 163 lb (73.9 kg)  06/14/20 163 lb (73.9 kg)  05/18/20 163 lb (73.9 kg)   BMI Readings from Last 3 Encounters:  01/20/21 26.31 kg/m  06/14/20 26.31 kg/m  05/18/20 26.31 kg/m    Assessment/Interventions: Review of patient past medical history, allergies, medications, health status, including review of consultants reports, laboratory and other test data, was performed as part of comprehensive evaluation and provision of chronic care management services.   SDOH:  (Social Determinants of Health) assessments and interventions performed: Yes SDOH Interventions    Flowsheet Row Most Recent Value  SDOH Interventions   Financial Strain Interventions Intervention Not Indicated       SDOH Screenings   Alcohol Screen: Low Risk     Last Alcohol Screening Score (AUDIT): 0  Depression (PHQ2-9): Low Risk    PHQ-2 Score: 0  Financial Resource Strain: Low Risk    Difficulty of Paying Living Expenses: Not very hard  Food Insecurity: No Food Insecurity   Worried About Charity fundraiser in the Last Year: Never true   Ran Out of Food in the Last Year: Never true  Housing: Low Risk    Last Housing Risk Score: 0  Physical Activity: Inactive   Days of Exercise per Week: 0 days   Minutes of Exercise per Session: 0 min  Social Connections: Not on file  Stress: No Stress  Concern Present   Feeling of Stress : Not at all  Tobacco Use: High Risk   Smoking Tobacco Use: Every Day   Smokeless Tobacco Use: Former   Passive Exposure: Not on file  Transportation Needs: No Transportation Needs   Lack of Transportation (Medical): No   Lack of Transportation (Non-Medical): No    CCM Care Plan  No Known Allergies  Medications Reviewed Today     Reviewed by [name], [facility] (Pharmacist) on 03/28/21 at 1148  Med List Status: <None>   Medication Order Taking? Sig Documenting Provider Last Dose Status Informant  acetaminophen (TYLENOL) 500 MG tablet [id number]  Take 1,000 mg by mouth every 6 (six) hours. Rapid release [provider]  Active Self  amLODipine (NORVASC) 5 MG tablet [id number]  Take 5 mg by mouth daily. [provider]  Expired 04/08/20 2359 Self  aspirin 81 MG tablet [id number] Yes Take 81 mg by mouth daily. [provider] Taking Active Self  atorvastatin (LIPITOR) 20 MG tablet [id number] Yes TAKE 1 TABLET BY MOUTH EVERY DAY IN THE EVENING FOR CHOLESTEROL. Office visit required for further refills. Pleas Koch, NP Taking Active   buPROPion [facility]) 150 MG 12 hr tablet [id number] Yes One tab daily for 5 days, twice a day starting on day 6. Pleas Koch, NP Taking Active            Med Note Andree Elk, [facility]   Mon Mar 28, 2021 11:41 AM)    clopidogrel (PLAVIX) 75 MG  tablet [id number] Yes Take 1 tablet (75 mg total) by mouth daily. Kris Hartmann, NP Taking Active   ferrous gluconate (FERGON) 324 MG tablet [id number]  TAKE 1 TABLET BY MOUTH EVERY DAY WITH BREAKFAST Ladene Artist, MD  Active   finasteride (PROSCAR) 5 MG tablet [id number]  TAKE 1 TABLET BY MOUTH EVERY DAY McGowan, Shannon A, PA-C  Active   glipiZIDE (GLUCOTROL) 10 MG tablet [id number] Yes Take 10 mg by mouth daily before supper.  [provider] Taking Active Self           Med Note Cristela Felt, [facility]   Wed Jun 27, 2017  8:06 AM)    GLUCOSAMINE HCL PO [id number]  Take 1 tablet by mouth daily. [provider]  Active Self  insulin detemir (LEVEMIR) 100 UNIT/ML injection [id number] Yes Inject 20 units at bedtime.  Patient taking differently: Inject 15 Units into the skin daily.   Lucille Passy, MD Taking Active Self           Med Note Andree Elk, [facility]   Mon Mar 28, 2021 11:41 AM)    lisinopril-hydrochlorothiazide (ZESTORETIC) 20-25 MG tablet [id number]  Take 1 tablet by mouth daily. For blood pressure. Office visit required for further refills. Pleas Koch, NP  Active   metFORMIN (GLUCOPHAGE) 500 MG tablet [id number] Yes TAKE 2 TABLETS BY MOUTH TWICE A DAY WITH MEAL FOR DIABETES Pleas Koch, NP Taking Active Self  metoprolol tartrate (LOPRESSOR) 50 MG tablet [id number]  TAKE 1/2 TABLET BY MOUTH TWICE A DAY FOR BLOOD PRESSURE. OFFICE VISIT REQUIRED. Pleas Koch, NP  Active   pantoprazole (PROTONIX) 40 MG tablet [id number]  Take 1 tablet (40 mg total) by mouth daily. For heartburn. Pleas Koch, NP  Active   tamsulosin [facility]) 0.4 MG CAPS capsule [id number]  Take 1 capsule (0.4 mg total) by mouth 2 (two) times daily. Nori Riis, PA-C  Active   tiZANidine (ZANAFLEX) 4 MG tablet [id number]  TAKE 1 TABLET (4 MG TOTAL) BY MOUTH 2 (TWO) TIMES DAILY AS NEEDED FOR MUSCLE SPASMS. Pleas Koch, NP  Active   triamcinolone cream (KENALOG) 0.5 % [id number]   Apply 1 application topically 2 (two) times daily. [provider]  Active Self  vitamin B-12 (CYANOCOBALAMIN) 1000 MCG tablet [id number]  Take 1,000 mcg by mouth daily. [provider]  Active Self            Patient Active Problem List   Diagnosis Date Noted   Gastric AVM    Iron deficiency anemia    Right buttock pain 01/12/2020   Fall at home, initial encounter 01/12/2020   Rash and nonspecific skin eruption 12/19/2019   Chronic pain of right hip 10/17/2019   Ischemic leg 05/29/2018   B12 deficiency 04/03/2018   PAD (peripheral artery disease) ([place]) 12/12/2017   Pain in limb 12/11/2017   GERD (gastroesophageal reflux disease) 04/30/2017   BPH with obstruction/lower urinary tract symptoms 10/14/2014   Essential hypertension 06/26/2014   Diabetes mellitus type 2, controlled ([place]) 06/26/2014   HLD (hyperlipidemia) 06/27/2006   Coronary atherosclerosis 06/27/2006   Tobacco abuse 06/27/2006    Immunization History  Administered Date(s) Administered   Moderna Sars-Covid-2 Vaccination 07/09/2019, 08/06/2019   Pneumococcal Conjugate-13 09/30/2018   Td 10/08/1994, 11/19/2006    Conditions to be addressed/monitored:  Hypertension, Diabetes and Tobacco use  Care Plan : [place]  Updates made by [name], [place] since 03/28/2021 12:00 AM     Problem: CHL AMB "PATIENT-SPECIFIC PROBLEM"      Long-Range Goal: Disease Management   Start Date: 06/24/2020  Priority: High  Note:   Current Barriers:  None identified  Pharmacist Clinical Goal(s):  Patient will contact provider office for questions/concerns as evidenced notation of same in electronic health record through collaboration with PharmD and provider.   Interventions: 1:1 collaboration with Pleas Koch, NP regarding development and update of comprehensive plan of care as evidenced by provider attestation and co-signature Inter-disciplinary care team collaboration (see  longitudinal plan of care) Comprehensive medication review performed; medication list updated in electronic medical record  Hypertension (BP goal <140/90) -Controlled, BP improved. He cut back to less than 1/2 PPD. Hiding ash trays, chewing gum. He is taking bupropion BID. -Current treatment: Amlodipine 5 mg - 1 tablet daily Lisinopril-HCTZ 20-25 mg - 1 tablet daily Metoprolol tartrate 50 mg - 1/2 tablet twice daily -Medications previously tried: rampril -Current home readings: average 120-130/60 (wrist cuff) -Current dietary habits: Uses salt substitute. Notices higher BP after eating bacon or sausage. Reports high water intake. -Current exercise habits: minimal -Denies hypotensive/hypertensive symptoms -Counseled to monitor BP at home 2-3 days per week, document, and provide log at future appointments -Recommended to continue current medication  Diabetes (A1c goal <7%) -Controlled - A1c 6.8%, followed by Dr. Gabriel Carina -Current medications: Glipizide 10 mg - 1 tablet daily before supper Levemir - 15 units at bedtime Metformin 500 mg - 2 tablets twice daily  -Medications previously tried: none reported -Current home glucose readings - checks daily fasting glucose: 100-130 fasting post prandial glucose: none -Denies hypoglycemic/hyperglycemic symptoms -Educated on A1c and blood sugar goals; -Counseled to check feet daily and get yearly eye exams -  up to date -Recommended to continue current medication  Tobacco use (Goal: Tobacco free) -Assessment - Making progress towards goals -Previous quit attempts: currently cutting back slowly -Current use: he has cut back to less than 1/2 PPD cigarettes day -Medications previously tried: nicotine patches -  states not effective -Current treatment  Bupropion 150 mg SR - 1 tablet twice daily (confirms taking) -Recommended to continue current medication  Patient Goals/Self-Care Activities Patient will:  - schedule follow up visit with  Alma Friendly, NP  Follow Up Plan:  CCM PharmD follow up - 12 months [place] adherence review - 3 months    Compliance and Adherence: Care Gaps: Foot exam - up to date A1c - completed 10/22 per endocrinology   Star Rating Drugs: Medication:                            Last Fill:         Day Supply Atorvastatin 20 mg                  01/24/2021      90 Lisinopril HCTZ 20-25 mg       01/06/2021      90 Metformin 500 mg                   02/11/2021      90 Glipizide 10 mg                       01/14/2021      90             Medication Assistance: None required.  Patient affirms current coverage meets needs.  Patient's preferred pharmacy is:  [id number] [place] [address]Phone: [phone]: [phone] Uses pill box? Yes - fills every 2 weeks [facility] preferred by insurance. Pt endorses missed dose of evening medications about once every 2 weeks. He tried not to miss any doses.  Care Plan and Follow Up Patient Decision:  Patient agrees to Care Plan and Follow-up.  MDebbora Dus PharmD Clinical Pharmacist [phone]

## 2021-03-28 NOTE — Patient Instructions (Signed)
Dear Antonio [name],  Below is a summary of the goals we discussed during our follow up appointment on March 28, 2021. Please contact me anytime with questions or concerns.   Visit Information  Patient Care Plan: CCM Pharmacy Care Plan     Problem Identified: CHL AMB "PATIENT-SPECIFIC PROBLEM"      Long-Range Goal: Disease Management   Start Date: 06/24/2020  Priority: High  Note:   Current Barriers:  None identified  Pharmacist Clinical Goal(s):  Patient will contact provider office for questions/concerns as evidenced notation of same in electronic health record through collaboration with PharmD and provider.   Interventions: 1:1 collaboration with Pleas Koch, NP regarding development and update of comprehensive plan of care as evidenced by provider attestation and co-signature Inter-disciplinary care team collaboration (see longitudinal plan of care) Comprehensive medication review performed; medication list updated in electronic medical record  Hypertension (BP goal <140/90) -Controlled, BP improved. He cut back to less than 1/2 PPD. Hiding ash trays, chewing gum. He is taking bupropion BID. -Current treatment: Amlodipine 5 mg - 1 tablet daily Lisinopril-HCTZ 20-25 mg - 1 tablet daily Metoprolol tartrate 50 mg - 1/2 tablet twice daily -Medications previously tried: rampril -Current home readings: average 120-130/60 (wrist cuff) -Current dietary habits: Uses salt substitute. Notices higher BP after eating bacon or sausage. Reports high water intake. -Current exercise habits: minimal -Denies hypotensive/hypertensive symptoms -Counseled to monitor BP at home 2-3 days per week, document, and provide log at future appointments -Recommended to continue current medication  Diabetes (A1c goal <7%) -Controlled - A1c 6.8%, followed by Dr. Gabriel Carina -Current medications: Glipizide 10 mg - 1 tablet daily before supper Levemir - 15 units at bedtime Metformin 500 mg - 2  tablets twice daily  -Medications previously tried: none reported -Current home glucose readings - checks daily fasting glucose: 100-130 fasting post prandial glucose: none -Denies hypoglycemic/hyperglycemic symptoms -Educated on A1c and blood sugar goals; -Counseled to check feet daily and get yearly eye exams -  up to date -Recommended to continue current medication  Tobacco use (Goal: Tobacco free) -Assessment - Making progress towards goals -Previous quit attempts: currently cutting back slowly -Current use: he has cut back to less than 1/2 PPD cigarettes day -Medications previously tried: nicotine patches - states not effective -Current treatment  Bupropion 150 mg SR - 1 tablet twice daily (confirms taking) -Recommended to continue current medication  Patient Goals/Self-Care Activities Patient will:  - schedule follow up visit with Alma Friendly, NP  Follow Up Plan:  CCM PharmD follow up - 12 months [place] adherence review - 3 months      Patient verbalizes understanding of instructions and care plan provided today and agrees to view in [place]. Active MyChart status confirmed with patient.    [name], PharmD Clinical Pharmacist Practitioner [place] [facility] [phone]

## 2021-03-29 ENCOUNTER — Other Ambulatory Visit: Payer: Self-pay

## 2021-03-29 ENCOUNTER — Ambulatory Visit (INDEPENDENT_AMBULATORY_CARE_PROVIDER_SITE_OTHER): Payer: Medicare Other | Admitting: Urology

## 2021-03-29 ENCOUNTER — Encounter: Payer: Self-pay | Admitting: Urology

## 2021-03-29 VITALS — BP 133/62 | HR 64 | Ht 66.0 in | Wt 163.0 lb

## 2021-03-29 DIAGNOSIS — N401 Enlarged prostate with lower urinary tract symptoms: Secondary | ICD-10-CM

## 2021-03-29 DIAGNOSIS — N138 Other obstructive and reflux uropathy: Secondary | ICD-10-CM

## 2021-03-29 DIAGNOSIS — R3989 Other symptoms and signs involving the genitourinary system: Secondary | ICD-10-CM

## 2021-03-29 LAB — URINALYSIS, COMPLETE
Bilirubin, UA: NEGATIVE
Glucose, UA: NEGATIVE
Nitrite, UA: POSITIVE — AB
Specific Gravity, UA: 1.025 (ref 1.005–1.030)
Urobilinogen, Ur: 0.2 mg/dL (ref 0.2–1.0)
pH, UA: 5 (ref 5.0–7.5)

## 2021-03-29 LAB — MICROSCOPIC EXAMINATION: WBC, UA: >30 /hpf — AB (ref 0–5)

## 2021-03-29 LAB — BLADDER SCAN AMB NON-IMAGING

## 2021-03-29 MED ORDER — SULFAMETHOXAZOLE-TRIMETHOPRIM 800-160 MG PO TABS
1.0000 | ORAL_TABLET | Freq: Two times a day (BID) | ORAL | 0 refills | Status: DC
Start: 2021-03-29 — End: 2021-04-27

## 2021-03-29 NOTE — Progress Notes (Signed)
7:33 PM   Antonio [name] 09/19/1945 [id number]  Referring provider: Pleas Koch, NP [place],  [place] [address]  Chief Complaint  Patient presents with   Benign Prostatic Hypertrophy   Urological history 1.  Urinary retention -Contributing factors of age, excessive alcohol consumption, BPH and diabetes -PVR 1 mL -Managed with tamsulosin 0.4 mg 2 capsules daily and finasteride 5 mg daily  2. BPH with LU TS -PSA 0.24 in 11/2019 -TARGIS > 10 years ago -cysto 2019 -very large prostate with significant lateral lobe hypertrophy trophy in the median lobe with intravesicular protrusion -PVR 1 mL -Managed with tamsulosin 0.4 mg 2 capsules daily and finasteride 5 mg daily   HPI: Antonio [name] is a 76 y.o. male who presents today for an urgent visit with symptoms of urgency w/very little output and burning.    Over the weekend, he started to experience limited urine output, urgency and frequency.  He had some old [place] plast catheters at home and he had to cath 6 times over the weekend.  He is recovering large volumes of urine when he caths.  He has not cathed for over the year.    Patient denies any modifying or aggravating factors.  Patient denies any gross hematuria or suprapubic/flank pain.  Patient denies any fevers, chills, nausea or vomiting.    UA nitrate positive, greater than 30 WBCs, 11-30 RBCs and many bacteria.  PVR 1 mL  PMH: Past Medical History:  Diagnosis Date   Arthritis    Asthma    age 36   BPH with urinary obstruction    stable on flomax ([name]   Coronary atherosclerosis of unspecified type of vessel, native or graft    Diabetes mellitus    Type II   Emphysema of lung (HCC)    Esophageal stricture    Gastritis    GERD (gastroesophageal reflux disease)    Hiatal hernia    Hx of colonic polyps    Hyperlipidemia    Hypertension    Internal hemorrhoids without mention of complication    Lumbago    Pain in both lower legs     PSA elevation    now averaging 2's   Tobacco abuse    URI (upper respiratory infection)    Urinary retention     Surgical History: Past Surgical History:  Procedure Laterality Date   2D Echo  06/03   Abdominal ultrasound  06/03   Negative   BACK SURGERY     COLONOSCOPY     CT of head  and sinuses  06/03   Negative   CT of the chest, abdomen, pelvis  06/03   Chest negative; Abdomen, left adrenal lesion; Pelvis, enlarged prostate   ESOPHAGOGASTRODUODENOSCOPY  02/13/07   gastritis and duodenitis without bleed   ESOPHAGOGASTRODUODENOSCOPY N/A 06/26/2014   Procedure: ESOPHAGOGASTRODUODENOSCOPY (EGD);  Surgeon: Milus Banister, MD;  Location: [facility];  Service: Endoscopy;  Laterality: N/A;   ESOPHAGOGASTRODUODENOSCOPY (EGD) WITH PROPOFOL N/A 04/08/2020   Procedure: ESOPHAGOGASTRODUODENOSCOPY (EGD) WITH PROPOFOL;  Surgeon: Ladene Artist, MD;  [facility];  Service: Endoscopy;  Laterality: N/A;   HOT HEMOSTASIS N/A 04/08/2020   Procedure: HOT HEMOSTASIS (ARGON PLASMA COAGULATION/BICAP);  Surgeon: Ladene Artist, MD;  Location: [facility];  Service: Endoscopy;  Laterality: N/A;   KNEE ARTHROSCOPY  10/00   Right   LOWER EXTREMITY ANGIOGRAPHY Right 12/27/2017   Procedure: LOWER EXTREMITY ANGIOGRAPHY;  Surgeon: Algernon Huxley, MD;  Location:  [facility];  Service: Cardiovascular;  Laterality: Right;   LOWER EXTREMITY ANGIOGRAPHY Left 01/10/2018   Procedure: LOWER EXTREMITY ANGIOGRAPHY;  Surgeon: Algernon Huxley, MD;  Location: [facility];  Service: Cardiovascular;  Laterality: Left;   LOWER EXTREMITY ANGIOGRAPHY Left 05/29/2018   Procedure: LOWER EXTREMITY ANGIOGRAPHY;  Surgeon: Algernon Huxley, MD;  Location: [facility];  Service: Cardiovascular;  Laterality: Left;   LOWER EXTREMITY ANGIOGRAPHY Left 05/30/2018   Procedure: Lower Extremity Angiography;  Surgeon: Algernon Huxley, MD;  Location: [facility];  Service: Cardiovascular;  Laterality: Left;    LP  06/03   Microwave thermotherapy prostate  08/28/07   [place]   Persantine cardiolite  03/01   EF 68%   POLYPECTOMY  11/95   Stress myoview  07/06   EF 67%   UPPER GASTROINTESTINAL ENDOSCOPY      Home Medications:  Allergies as of 03/29/2021   No Known Allergies      Medication List        Accurate as of March 29, 2021 11:59 PM. If you have any questions, ask your nurse or doctor.          acetaminophen 500 MG tablet Commonly known as: TYLENOL Take 1,000 mg by mouth every 6 (six) hours. Rapid release   amLODipine 5 MG tablet Commonly known as: NORVASC Take 5 mg by mouth daily.   aspirin 81 MG tablet Take 81 mg by mouth daily.   atorvastatin 20 MG tablet Commonly known as: LIPITOR TAKE 1 TABLET BY MOUTH EVERY DAY IN THE EVENING FOR CHOLESTEROL. Office visit required for further refills.   buPROPion 150 MG 12 hr tablet Commonly known as: WELLBUTRIN SR One tab daily for 5 days, twice a day starting on day 6.   clopidogrel 75 MG tablet Commonly known as: PLAVIX Take 1 tablet (75 mg total) by mouth daily.   ferrous gluconate 324 MG tablet Commonly known as: FERGON TAKE 1 TABLET BY MOUTH EVERY DAY WITH BREAKFAST   finasteride 5 MG tablet Commonly known as: PROSCAR TAKE 1 TABLET BY MOUTH EVERY DAY   glipiZIDE 10 MG tablet Commonly known as: GLUCOTROL Take 10 mg by mouth daily before supper.   GLUCOSAMINE HCL PO Take 1 tablet by mouth daily.   insulin detemir 100 UNIT/ML injection Commonly known as: Levemir Inject 20 units at bedtime. What changed:  how much to take how to take this when to take this additional instructions   lisinopril-hydrochlorothiazide 20-25 MG tablet Commonly known as: ZESTORETIC Take 1 tablet by mouth daily. For blood pressure. Office visit required for further refills.   metFORMIN 500 MG tablet Commonly known as: GLUCOPHAGE TAKE 2 TABLETS BY MOUTH TWICE A DAY WITH MEAL FOR DIABETES   metoprolol tartrate 50 MG  tablet Commonly known as: LOPRESSOR TAKE 1/2 TABLET BY MOUTH TWICE A DAY FOR BLOOD PRESSURE. OFFICE VISIT REQUIRED.   pantoprazole 40 MG tablet Commonly known as: PROTONIX Take 1 tablet (40 mg total) by mouth daily. For heartburn.   sulfamethoxazole-trimethoprim 800-160 MG tablet Commonly known as: BACTRIM DS Take 1 tablet by mouth every 12 (twelve) hours. Started by: Zara Council, PA-C   tamsulosin 0.4 MG Caps capsule Commonly known as: FLOMAX Take 1 capsule (0.4 mg total) by mouth 2 (two) times daily.   tiZANidine 4 MG tablet Commonly known as: ZANAFLEX TAKE 1 TABLET (4 MG TOTAL) BY MOUTH 2 (TWO) TIMES DAILY AS NEEDED FOR MUSCLE SPASMS.   triamcinolone cream 0.5 %  Commonly known as: KENALOG Apply 1 application topically 2 (two) times daily.   vitamin B-12 1000 MCG tablet Commonly known as: CYANOCOBALAMIN Take 1,000 mcg by mouth daily.        Allergies: No Known Allergies  Family History: Family History  Problem Relation Age of Onset   Diabetes Father    Alcohol abuse Paternal Uncle    Alcohol abuse Paternal Uncle    Alcohol abuse Paternal Uncle    Rheum arthritis Mother    Heart disease Neg Hx    Stroke Neg Hx    Cancer Neg Hx    Drug abuse Neg Hx    Depression Neg Hx    Colon cancer Neg Hx    Rectal cancer Neg Hx    Stomach cancer Neg Hx    Kidney cancer Neg Hx    Bladder Cancer Neg Hx    Prostate cancer Neg Hx     Social History:  reports that he has been smoking cigarettes. He has a 28.50 pack-year smoking history. He has quit using smokeless tobacco.  His smokeless tobacco use included chew. He reports that he does not currently use alcohol. He reports that he does not use drugs.  ROS: For pertinent review of systems please refer to history of present illness  Physical Exam: Blood pressure 133/62, pulse 64, height 5' 6"  (1.676 m), weight 163 lb (73.9 kg).  Constitutional:  Well nourished. Alert and oriented, No acute distress. HEENT: [place] AT,  mask in place.  Trachea midline Cardiovascular: No clubbing, cyanosis, or edema. Respiratory: Normal respiratory effort, no increased work of breathing. Neurologic: Grossly intact, no focal deficits, moving all 4 extremities. Psychiatric: Normal mood and affect.    Laboratory Data: Hemoglobin A1C 4.2 - 5.6 % 6.8 High    Average Blood Glucose (Calc) mg/dL [facility] - LAB  Narrative Performed by [facility] - LAB Normal Range:    4.2 - 5.6%  Increased Risk:  5.7 - 6.4%  Diabetes:        >= 6.5%  Glycemic Control for adults with diabetes:  <7%   Specimen Collected: 11/10/20 07:49 Last Resulted: 11/10/20 08:49  Received From: [facility]  Result Received: 11/18/20 17:17   Cholesterol, Total 100 - 200 mg/dL 113   Triglyceride 35 - 199 mg/dL 157   HDL (High Density Lipoprotein) Cholesterol 29.0 - 71.0 mg/dL 37.8   LDL Calculated 0 - 130 mg/dL 44   VLDL Cholesterol mg/dL 31   Cholesterol/HDL Ratio  3.0   Resulting [facility] - LAB  Specimen Collected: 11/10/20 07:49 Last Resulted: 11/10/20 09:47  Received From: [facility]  Result Received: 11/18/20 17:17   Glucose 70 - 110 mg/dL 159 High    Sodium 136 - 145 mmol/L 139   Potassium 3.6 - 5.1 mmol/L 4.7   Chloride 97 - 109 mmol/L 106   Carbon Dioxide (CO2) 22.0 - 32.0 mmol/L 25.0   Calcium 8.7 - 10.3 mg/dL 9.0   Urea Nitrogen (BUN) 7 - 25 mg/dL 19   Creatinine 0.7 - 1.3 mg/dL 0.9   Glomerular Filtration Rate (eGFR), MDRD Estimate >60 mL/min/1.73sq m 82   BUN/Crea Ratio 6.0 - 20.0 21.1 High    Anion Gap w/K 6.0 - 16.0 12.7   Resulting Agency  [facility] - LAB  Specimen Collected: 11/10/20 07:49 Last Resulted: 11/10/20 09:04  Received From: [facility]  Result Received: 01/06/21  06:41   Component     Latest Ref Rng & Units 03/29/2021  Specific Gravity, UA     1.005 - 1.030 1.025  pH, UA     5.0 - 7.5 5.0   Color, UA     Yellow Yellow  Appearance Ur     Clear Cloudy (A)  Leukocytes,UA     Negative 1+ (A)  Protein,UA     Negative/Trace 2+ (A)  Glucose, UA     Negative Negative  Ketones, UA     Negative Trace (A)  RBC, UA     Negative 3+ (A)  Bilirubin, UA     Negative Negative  Urobilinogen, Ur     0.2 - 1.0 mg/dL 0.2  Nitrite, UA     Negative Positive (A)  Microscopic Examination      See below:   Component     Latest Ref Rng & Units 03/29/2021          WBC, UA     0 - 5 /hpf >30 (A)  RBC     0 - 2 /hpf 11-30 (A)  Epithelial Cells (non renal)     0 - 10 /hpf 0-10  Renal Epithel, UA     None seen /hpf 0-10 (A)  Bacteria, UA     None seen/Few Many (A)  I have reviewed the labs.  Pertinent Imaging:  03/29/21 10:23  Scan Result 2m     Assessment & Plan:   1. Suspected UTI -UA grossly infected -Urine sent for culture -started Septra DS, BID x 7 days -We will adjust if necessary once urine culture results are available  2. BPH with LU TS -Continue tamsulosin 0.4 mg 2 tablets daily and finasteride 5 mg daily -We will need repeat PSA if urinary tract has cleared  Return in about 1 month (around 04/26/2021) for symptom recheck .  SZara Council PA-C  [facility] [address] [phone]

## 2021-03-31 ENCOUNTER — Other Ambulatory Visit: Payer: Self-pay | Admitting: Gastroenterology

## 2021-03-31 DIAGNOSIS — K2990 Gastroduodenitis, unspecified, without bleeding: Secondary | ICD-10-CM

## 2021-04-03 LAB — CULTURE, URINE COMPREHENSIVE

## 2021-04-05 DIAGNOSIS — E1159 Type 2 diabetes mellitus with other circulatory complications: Secondary | ICD-10-CM

## 2021-04-05 DIAGNOSIS — I10 Essential (primary) hypertension: Secondary | ICD-10-CM | POA: Diagnosis not present

## 2021-04-05 DIAGNOSIS — Z794 Long term (current) use of insulin: Secondary | ICD-10-CM

## 2021-04-05 DIAGNOSIS — F17210 Nicotine dependence, cigarettes, uncomplicated: Secondary | ICD-10-CM | POA: Diagnosis not present

## 2021-04-08 ENCOUNTER — Other Ambulatory Visit: Payer: Self-pay | Admitting: Primary Care

## 2021-04-08 DIAGNOSIS — I10 Essential (primary) hypertension: Secondary | ICD-10-CM

## 2021-04-08 NOTE — Telephone Encounter (Signed)
Support Pool: ? ?Patient is overdue for follow up and needs to be seen ASAP. ?Please schedule. Any day, any slot, any time. ? ?Thanks. ?

## 2021-04-11 ENCOUNTER — Other Ambulatory Visit: Payer: Self-pay | Admitting: Urology

## 2021-04-11 ENCOUNTER — Other Ambulatory Visit: Payer: Self-pay | Admitting: Primary Care

## 2021-04-11 DIAGNOSIS — N401 Enlarged prostate with lower urinary tract symptoms: Secondary | ICD-10-CM

## 2021-04-11 DIAGNOSIS — N138 Other obstructive and reflux uropathy: Secondary | ICD-10-CM

## 2021-04-11 DIAGNOSIS — K2100 Gastro-esophageal reflux disease with esophagitis, without bleeding: Secondary | ICD-10-CM

## 2021-04-12 ENCOUNTER — Other Ambulatory Visit: Payer: Self-pay

## 2021-04-12 ENCOUNTER — Ambulatory Visit
Admission: RE | Admit: 2021-04-12 | Discharge: 2021-04-12 | Disposition: A | Discharge status: Home / Self Care | Payer: Medicare Other | Source: Ambulatory Visit | Attending: Acute Care | Admitting: Acute Care

## 2021-04-12 DIAGNOSIS — F17210 Nicotine dependence, cigarettes, uncomplicated: Secondary | ICD-10-CM | POA: Diagnosis not present

## 2021-04-12 DIAGNOSIS — Z87891 Personal history of nicotine dependence: Secondary | ICD-10-CM | POA: Insufficient documentation

## 2021-04-13 ENCOUNTER — Telehealth: Payer: Self-pay | Admitting: Acute Care

## 2021-04-13 NOTE — Telephone Encounter (Signed)
I have attempted to call the patient with the results of their low dose CT. There was no answer. I left a HIPPA compliant message on their VM with the office contact number requesting that they call [phone] to review the results of the scan. Antonio [name], this patient will need a PET and follow up with Dr. Patsey Berthold after. I will wait to place the order until I can get in touch with the patient, as I do not want them calling to get it scheduled until I speak with the patient. Thanks so much.  ?

## 2021-04-14 ENCOUNTER — Other Ambulatory Visit: Payer: Self-pay | Admitting: Acute Care

## 2021-04-14 ENCOUNTER — Telehealth: Payer: Self-pay

## 2021-04-14 DIAGNOSIS — R911 Solitary pulmonary nodule: Secondary | ICD-10-CM

## 2021-04-14 NOTE — Telephone Encounter (Signed)
I have attempted to call the patient with the results of his low dose CT Chest. He had called and left a message for [place] to call him at [phone]. There was no answer on the number the patient had requested we call, or on the mobile number. I left a HIPPA compliant message on the VM requesting the patient return the call. We will continue to call the patient. If we do not make contact we will send a letter asking the patient to call .  ?

## 2021-04-14 NOTE — Progress Notes (Signed)
Pt. Has been called and PET scan has been ordered.  ?

## 2021-04-14 NOTE — Telephone Encounter (Signed)
ATC x2 patient to offer OV for 05/10/2021. Received recording that call could not be completed at this time.  ? ?

## 2021-04-14 NOTE — Telephone Encounter (Signed)
Patient will need OV with Dr. Patsey Berthold after PET.  ?Will call to schedule OV after PET is scheduled.  ? ?

## 2021-04-14 NOTE — Telephone Encounter (Signed)
I have called the patient with the results of his low-dose CT.  I explained that his scan was read as a lung RADS 4B, suspicious recommendation is for additional imaging evaluation or consult with pulmonary or thoracic surgery.  I have discussed the patient's case with Dr. Jerrye Noble pulmonary [place].  Plan is for a PET scan now and follow-up with Dr. Patsey Berthold once PET scan has been completed.  I explained to the patient that the scanner at [facility] is down, and he is willing to come to [facility] for his PET scan so that it can be expedited.  I will place the order for the PET scan, and we will request that [facility] schedule patient with Dr. Patsey Berthold once the PET scan has been completed. ?Please fax results of original low-dose CT to PCP, and let them know we have follow-up imaging scheduled, with plan to follow-up with Dr. Patsey Berthold once that has been completed to review and determine best plan of care.  The patient is in agreement with this plan, he verbalized understanding and had no further questions at completion of the call. ?Please keep patient on tickler list in case findings are negative for malignancy.  Thanks so much ? ?

## 2021-04-14 NOTE — Telephone Encounter (Signed)
Results and follow up plan faxed to PCP ?

## 2021-04-18 NOTE — Telephone Encounter (Signed)
ATC x3. Received recording that call could not be completed.  ?Letter mailed to address on file.  ?

## 2021-04-21 ENCOUNTER — Other Ambulatory Visit: Payer: Self-pay | Admitting: Primary Care

## 2021-04-21 DIAGNOSIS — E782 Mixed hyperlipidemia: Secondary | ICD-10-CM

## 2021-04-21 DIAGNOSIS — I10 Essential (primary) hypertension: Secondary | ICD-10-CM

## 2021-04-22 NOTE — Telephone Encounter (Signed)
Number not taking calls at this time.  ?

## 2021-04-22 NOTE — Telephone Encounter (Signed)
Please call patient: ? ?He is significantly overdue for follow-up with me, please schedule ASAP.  I sent this to the support pool 1+ month ago. ?

## 2021-04-25 ENCOUNTER — Other Ambulatory Visit: Payer: Self-pay | Admitting: Primary Care

## 2021-04-25 ENCOUNTER — Other Ambulatory Visit: Payer: Self-pay | Admitting: Gastroenterology

## 2021-04-25 DIAGNOSIS — K2100 Gastro-esophageal reflux disease with esophagitis, without bleeding: Secondary | ICD-10-CM

## 2021-04-25 DIAGNOSIS — K2990 Gastroduodenitis, unspecified, without bleeding: Secondary | ICD-10-CM

## 2021-04-26 NOTE — Telephone Encounter (Signed)
Called patient appointment made for 05/12/2021 at 10:20 ?

## 2021-04-27 ENCOUNTER — Ambulatory Visit (INDEPENDENT_AMBULATORY_CARE_PROVIDER_SITE_OTHER): Payer: Medicare Other | Admitting: Urology

## 2021-04-27 ENCOUNTER — Encounter: Payer: Self-pay | Admitting: Urology

## 2021-04-27 ENCOUNTER — Other Ambulatory Visit: Payer: Self-pay

## 2021-04-27 VITALS — BP 151/61 | HR 66 | Ht 66.0 in | Wt 164.0 lb

## 2021-04-27 DIAGNOSIS — N390 Urinary tract infection, site not specified: Secondary | ICD-10-CM

## 2021-04-27 DIAGNOSIS — N401 Enlarged prostate with lower urinary tract symptoms: Secondary | ICD-10-CM | POA: Diagnosis not present

## 2021-04-27 DIAGNOSIS — N528 Other male erectile dysfunction: Secondary | ICD-10-CM | POA: Diagnosis not present

## 2021-04-27 DIAGNOSIS — R3129 Other microscopic hematuria: Secondary | ICD-10-CM

## 2021-04-27 DIAGNOSIS — N138 Other obstructive and reflux uropathy: Secondary | ICD-10-CM

## 2021-04-27 LAB — BLADDER SCAN AMB NON-IMAGING

## 2021-04-27 MED ORDER — SILDENAFIL CITRATE 100 MG PO TABS: 100.0000 mg | ORAL_TABLET | Freq: Every day | ORAL | 0 refills | Status: AC | PRN

## 2021-04-27 NOTE — Progress Notes (Signed)
? ? ?4:20 PM  ? ?Antonio [name] ?1945/05/13 ?[id number] ? ?Referring provider: Pleas Koch, NP ?[address] ?[place],  [place] [address] ? ?Chief Complaint  ?Patient presents with  ? Benign Prostatic Hypertrophy  ? ?Urological history ?1.  Urinary retention ?-Contributing factors of age, excessive alcohol consumption, BPH and diabetes ?-PVR 0 mL ?-Managed with tamsulosin 0.4 mg 2 capsules daily and finasteride 5 mg daily ? ?2. BPH with LU TS ?-PSA 0.24 in 11/2019 ?-TARGIS > 10 years ago ?-cysto 2019 -very large prostate with significant lateral lobe hypertrophy trophy in the median lobe with intravesicular protrusion ?-I PSS 12/2 ?-PVR 0 mL ?-Managed with tamsulosin 0.4 mg 2 capsules daily and finasteride 5 mg daily ? ? ?HPI: ?Antonio [name] is a 76 y.o. male who presents today for follow up after an UTI with hematuria. ? ?He states he feels great.  His dysuria abated after three antibiotic tablets, but he did finish the prescription.  Patient denies any modifying or aggravating factors.  Patient denies any gross hematuria, dysuria or suprapubic/flank pain.  Patient denies any fevers, chills, nausea or vomiting.   ? ?He had to only cath once since seeing me.  ? ?UA nitrite positive, > 30 WBC's, 3-10 RBC's and many bacteria.   ? ?PVR 0 mL ? ?He has also started dating a lady and would like some help to become more intimate.  He is able to achieve an erection, but it is not adequate for satisfactory intercourse.   ? ?He does not take nitrates.   ? ? ?PMH: ?Past Medical History:  ?Diagnosis Date  ? Arthritis   ? Asthma   ? age 46  ? BPH with urinary obstruction   ? stable on flomax ([name]  ? Coronary atherosclerosis of unspecified type of vessel, native or graft   ? 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 Emphysema of lung ([facility])   ? Esophageal stricture   ? Gastritis   ? GERD (gastroesophageal reflux disease)   ? Hiatal hernia   ? Hx of colonic polyps   ? Hyperlipidemia   ? Hypertension   ? Internal hemorrhoids  without mention of complication   ? Lumbago   ? Pain in both lower legs   ? PSA elevation   ? now averaging 2's  ? Tobacco abuse   ? URI (upper respiratory infection)   ? Urinary retention   ? ? ?Surgical History: ?Past Surgical History:  ?Procedure Laterality Date  ? 2D Echo  06/03  ? Abdominal ultrasound  06/03  ? Negative  ? BACK SURGERY    ? COLONOSCOPY    ? CT of head  and sinuses  06/03  ? Negative  ? CT of the chest, abdomen, pelvis  06/03  ? Chest negative; Abdomen, left adrenal lesion; Pelvis, enlarged prostate  ? ESOPHAGOGASTRODUODENOSCOPY  02/13/07  ? gastritis and duodenitis without bleed  ? ESOPHAGOGASTRODUODENOSCOPY N/A 06/26/2014  ? Procedure: ESOPHAGOGASTRODUODENOSCOPY (EGD);  Surgeon: Milus Banister, MD;  Location: [facility];  Service: Endoscopy;  Laterality: N/A;  ? ESOPHAGOGASTRODUODENOSCOPY (EGD) WITH PROPOFOL N/A 04/08/2020  ? Procedure: ESOPHAGOGASTRODUODENOSCOPY (EGD) WITH PROPOFOL;  Surgeon: Ladene Artist, MD;  [facility];  Service: Endoscopy;  Laterality: N/A;  ? HOT HEMOSTASIS N/A 04/08/2020  ? Procedure: HOT HEMOSTASIS (ARGON PLASMA COAGULATION/BICAP);  Surgeon: Ladene Artist, MD;  Location: [facility];  Service: Endoscopy;  Laterality: N/A;  ? KNEE ARTHROSCOPY  10/00  ? Right  ? LOWER EXTREMITY  ANGIOGRAPHY Right 12/27/2017  ? Procedure: LOWER EXTREMITY ANGIOGRAPHY;  Surgeon: Algernon Huxley, MD;  Location: [facility];  Service: Cardiovascular;  Laterality: Right;  ? LOWER EXTREMITY ANGIOGRAPHY Left 01/10/2018  ? Procedure: LOWER EXTREMITY ANGIOGRAPHY;  Surgeon: Algernon Huxley, MD;  Location: [facility];  Service: Cardiovascular;  Laterality: Left;  ? LOWER EXTREMITY ANGIOGRAPHY Left 05/29/2018  ? Procedure: LOWER EXTREMITY ANGIOGRAPHY;  Surgeon: Algernon Huxley, MD;  Location: [facility];  Service: Cardiovascular;  Laterality: Left;  ? LOWER EXTREMITY ANGIOGRAPHY Left 05/30/2018  ? Procedure: Lower Extremity Angiography;  Surgeon: Algernon Huxley, MD;   Location: [facility];  Service: Cardiovascular;  Laterality: Left;  ? LP  06/03  ? Microwave thermotherapy prostate  08/28/07  ? [place] Blocker  ? Persantine cardiolite  03/01  ? EF 68%  ? POLYPECTOMY  11/95  ? Stress myoview  07/06  ? EF 67%  ? UPPER GASTROINTESTINAL ENDOSCOPY    ? ? ?Home Medications:  ?Allergies as of 04/27/2021   ?No Known Allergies ?  ? ?  ?Medication List  ?  ? ?  ? Accurate as of April 27, 2021 11:59 PM. If you have any questions, ask your nurse or doctor.  ?  ?  ? ?  ? ?STOP taking these medications   ? ?sulfamethoxazole-trimethoprim 800-160 MG tablet ?Commonly known as: BACTRIM DS ?Stopped by: Zara Council, PA-C ?  ? ?  ? ?TAKE these medications   ? ?acetaminophen 500 MG tablet ?Commonly known as: TYLENOL ?Take 1,000 mg by mouth every 6 (six) hours. Rapid release ?  ?amLODipine 5 MG tablet ?Commonly known as: NORVASC ?Take 5 mg by mouth daily. ?  ?aspirin 81 MG tablet ?Take 81 mg by mouth daily. ?  ?atorvastatin 20 MG tablet ?Commonly known as: LIPITOR ?TAKE 1 TABLET BY MOUTH DAILY IN THE EVENING FOR CHOLESTEROL. Office visit required for further refills. ?  ?buPROPion 150 MG 12 hr tablet ?Commonly known as: WELLBUTRIN SR ?One tab daily for 5 days, twice a day starting on day 6. ?  ?clopidogrel 75 MG tablet ?Commonly known as: PLAVIX ?Take 1 tablet (75 mg total) by mouth daily. ?  ?ferrous gluconate 324 MG tablet ?Commonly known as: FERGON ?TAKE 1 TABLET BY MOUTH EVERY DAY WITH BREAKFAST ?  ?finasteride 5 MG tablet ?Commonly known as: PROSCAR ?TAKE 1 TABLET BY MOUTH EVERY DAY ?  ?glipiZIDE 10 MG tablet ?Commonly known as: GLUCOTROL ?Take 10 mg by mouth daily before supper. ?  ?GLUCOSAMINE HCL PO ?Take 1 tablet by mouth daily. ?  ?insulin detemir 100 UNIT/ML injection ?Commonly known as: Levemir ?Inject 20 units at bedtime. ?What changed:  ?how much to take ?how to take this ?when to take this ?additional instructions ?  ?lisinopril-hydrochlorothiazide 20-25 MG tablet ?Commonly known as:  ZESTORETIC ?TAKE 1 TABLET BY MOUTH DAILY. FOR BLOOD PRESSURE. OFFICE VISIT REQUIRED FOR FURTHER REFILLS. ?  ?metFORMIN 500 MG tablet ?Commonly known as: GLUCOPHAGE ?TAKE 2 TABLETS BY MOUTH TWICE A DAY WITH MEAL FOR DIABETES ?  ?metoprolol tartrate 50 MG tablet ?Commonly known as: LOPRESSOR ?TAKE 1/2 TABLET BY MOUTH TWICE A DAY FOR BLOOD PRESSURE. OFFICE VISIT REQUIRED. ?  ?pantoprazole 40 MG tablet ?Commonly known as: PROTONIX ?Take 1 tablet (40 mg total) by mouth daily. For heartburn. Office visit required for further refills. ?  ?sildenafil 100 MG tablet ?Commonly known as: Viagra ?Take 1 tablet (100 mg total) by mouth daily as needed for erectile dysfunction. ?Started  by: Medrith Veillon, PA-C ?  ?tamsulosin 0.4 MG Caps capsule ?Commonly known as: FLOMAX ?TAKE 1 CAPSULE BY MOUTH 2 TIMES DAILY. ?  ?tiZANidine 4 MG tablet ?Commonly known as: ZANAFLEX ?TAKE 1 TABLET (4 MG TOTAL) BY MOUTH 2 (TWO) TIMES DAILY AS NEEDED FOR MUSCLE SPASMS. ?  ?triamcinolone cream 0.5 % ?Commonly known as: KENALOG ?Apply 1 application topically 2 (two) times daily. ?  ?vitamin B-12 1000 MCG tablet ?Commonly known as: CYANOCOBALAMIN ?Take 1,000 mcg by mouth daily. ?  ? ?  ? ? ?Allergies: No Known Allergies ? ?Family History: ?Family History  ?Problem Relation Age of Onset  ? Diabetes Father   ? Alcohol abuse Paternal Uncle   ? Alcohol abuse Paternal Uncle   ? Alcohol abuse Paternal Uncle   ? Rheum arthritis Mother   ? Heart disease Neg Hx   ? Stroke Neg Hx   ? Cancer Neg Hx   ? Drug abuse Neg Hx   ? Depression Neg Hx   ? Colon cancer Neg Hx   ? Rectal cancer Neg Hx   ? Stomach cancer Neg Hx   ? Kidney cancer Neg Hx   ? Bladder Cancer Neg Hx   ? Prostate cancer Neg Hx   ? ? ?Social History:  reports that he has been smoking cigarettes. He has a 28.50 pack-year smoking history. He has quit using smokeless tobacco.  His smokeless tobacco use included chew. He reports that he does not currently use alcohol. He reports that he does not use  drugs. ? ?ROS: ?For pertinent review of systems please refer to history of present illness ? ?Physical Exam: ?Blood pressure (!) 151/61, pulse 66, height 5\' 6"  (1.676 m), weight 164 lb (74.4 kg).  ?Constitutional

## 2021-04-28 LAB — URINALYSIS, COMPLETE
Bilirubin, UA: NEGATIVE
Ketones, UA: NEGATIVE
Nitrite, UA: POSITIVE — AB
Specific Gravity, UA: 1.02 (ref 1.005–1.030)
Urobilinogen, Ur: 0.2 mg/dL (ref 0.2–1.0)
pH, UA: 5.5 (ref 5.0–7.5)

## 2021-04-28 LAB — MICROSCOPIC EXAMINATION: WBC, UA: >30 /hpf — AB (ref 0–5)

## 2021-05-02 ENCOUNTER — Ambulatory Visit (HOSPITAL_COMMUNITY)
Admission: RE | Admit: 2021-05-02 | Discharge: 2021-05-02 | Disposition: A | Discharge status: Home / Self Care | Payer: Medicare Other | Source: Ambulatory Visit | Attending: Acute Care | Admitting: Acute Care

## 2021-05-02 ENCOUNTER — Other Ambulatory Visit: Payer: Self-pay

## 2021-05-02 DIAGNOSIS — I700 Atherosclerosis of aorta: Secondary | ICD-10-CM | POA: Diagnosis not present

## 2021-05-02 DIAGNOSIS — D3500 Benign neoplasm of unspecified adrenal gland: Secondary | ICD-10-CM | POA: Diagnosis not present

## 2021-05-02 DIAGNOSIS — R911 Solitary pulmonary nodule: Secondary | ICD-10-CM | POA: Diagnosis not present

## 2021-05-02 DIAGNOSIS — I2510 Atherosclerotic heart disease of native coronary artery without angina pectoris: Secondary | ICD-10-CM | POA: Diagnosis not present

## 2021-05-02 LAB — CULTURE, URINE COMPREHENSIVE

## 2021-05-02 LAB — GLUCOSE, CAPILLARY: Glucose-Capillary: 207 mg/dL — ABNORMAL HIGH (ref 70–99)

## 2021-05-02 MED ORDER — FLUDEOXYGLUCOSE F - 18 (FDG) INJECTION
8.2000 | Freq: Once | INTRAVENOUS | Status: AC
  Administered 2021-05-02: 8.18 via INTRAVENOUS

## 2021-05-03 ENCOUNTER — Telehealth: Payer: Self-pay

## 2021-05-03 DIAGNOSIS — N390 Urinary tract infection, site not specified: Secondary | ICD-10-CM

## 2021-05-03 MED ORDER — CEFUROXIME AXETIL 250 MG PO TABS: 500.0000 mg | ORAL_TABLET | Freq: Two times a day (BID) | ORAL | Status: DC

## 2021-05-03 NOTE — Telephone Encounter (Signed)
-----   Message from Nori Riis, PA-C sent at 05/02/2021  7:59 AM EDT ----- ?Please let Antonio [name] know that his urine culture was positive for infection again and we need for him to start Ceftin 500 mg, twice daily for seven days.  We then need to check his urine again when he finishes the antibiotic.  ?

## 2021-05-03 NOTE — Telephone Encounter (Signed)
LMOM notifying pt. Advised pt to return call to set up repeat UA lab appt. Abx sent to pt's pharmacy.  ?

## 2021-05-04 ENCOUNTER — Other Ambulatory Visit: Payer: Self-pay | Admitting: Primary Care

## 2021-05-04 ENCOUNTER — Other Ambulatory Visit: Payer: Self-pay

## 2021-05-04 DIAGNOSIS — I10 Essential (primary) hypertension: Secondary | ICD-10-CM

## 2021-05-04 DIAGNOSIS — N390 Urinary tract infection, site not specified: Secondary | ICD-10-CM

## 2021-05-04 MED ORDER — CEFUROXIME AXETIL 500 MG PO TABS: 500.0000 mg | ORAL_TABLET | Freq: Two times a day (BID) | ORAL | 0 refills | Status: AC

## 2021-05-04 NOTE — Addendum Note (Signed)
Addended by: Evelina Bucy on: 05/04/2021 11:30 AM ? ? Modules accepted: Orders ? ?

## 2021-05-04 NOTE — Telephone Encounter (Signed)
Appt made for UA recheck. Pt notes his ABX was not at pharmacy for pickup yesterday. Upon further review, Rx was entered incorrectly on my behalf. Correct Rx sent. Patient confirmed.  ?

## 2021-05-05 NOTE — Telephone Encounter (Signed)
Appt scheduled for 05/12/21 ?

## 2021-05-06 ENCOUNTER — Ambulatory Visit (INDEPENDENT_AMBULATORY_CARE_PROVIDER_SITE_OTHER): Payer: Medicare Other | Admitting: Pulmonary Disease

## 2021-05-06 ENCOUNTER — Encounter: Payer: Self-pay | Admitting: Pulmonary Disease

## 2021-05-06 ENCOUNTER — Telehealth: Payer: Self-pay

## 2021-05-06 VITALS — BP 118/80 | HR 53 | Temp 97.1°F | Ht 66.0 in | Wt 164.6 lb

## 2021-05-06 DIAGNOSIS — I739 Peripheral vascular disease, unspecified: Secondary | ICD-10-CM

## 2021-05-06 DIAGNOSIS — J449 Chronic obstructive pulmonary disease, unspecified: Secondary | ICD-10-CM

## 2021-05-06 DIAGNOSIS — R911 Solitary pulmonary nodule: Secondary | ICD-10-CM

## 2021-05-06 DIAGNOSIS — F17210 Nicotine dependence, cigarettes, uncomplicated: Secondary | ICD-10-CM

## 2021-05-06 MED ORDER — TRELEGY ELLIPTA 100-62.5-25 MCG/ACT IN AEPB: 100.0000 ug | INHALATION_SPRAY | Freq: Every day | RESPIRATORY_TRACT | 0 refills | Status: DC

## 2021-05-06 NOTE — Progress Notes (Signed)
? ?Subjective:  ? ? Patient ID: Antonio [name], male    DOB: [birth year], 76 y.o.   MRN: [id number] ?Patient Care Team: ?Pleas Koch, NP as PCP - General (Internal Medicine) ?Laneta Simmers as Physician Assistant (Urology) ?Ladene Artist, MD as Consulting Physician (Gastroenterology) ?[name], [facility] as Pharmacist (Pharmacist) ? ?Chief Complaint  ?Patient presents with  ? Consult  ?  Lung nodule found on lung cancer screening  ? ?HPI ?This is a 76 year old current smoker (half PPD, 114 PY) who presents for evaluation of a left perihilar lung nodule found during routine lung cancer screening.  Patient is kindly referred by Alma Friendly, NP.  Patient has been asymptomatic with regards to this nodule.  He has not had any hemoptysis.  No weight loss or anorexia.  He has chronic morning cough productive of clear to whitish sputum.  This has been going on for a number of years and is unchanged in quality.  He has not had any orthopnea or paroxysmal nocturnal dyspnea.  No chest pain, no lower extremity edema nor calf tenderness.  He notes some shortness of breath with walking up inclines or picking up heavy objects.  This has also been going on for a number of years and he had attributed to being "out of shape".  He occasionally notices wheezing.  He is not on any inhalers.  Does recall a history of asthma in childhood. ? ?He has no military history.  Did work in [employer] until retirement several years ago.  As noted he has smoked 2 packs of cigarettes per day up until recently (few weeks ago) when he decreased to half a pack of cigarettes per day. ? ? ?Review of Systems ?A 10 point review of systems was performed and it is as noted above otherwise negative. ? ?Past Medical History:  ?Diagnosis Date  ? Arthritis   ? Asthma   ? age 57  ? BPH with urinary obstruction   ? stable on flomax ([name]  ? Coronary atherosclerosis of unspecified type of vessel, native or graft   ? Diabetes mellitus    ? Type II  ? Emphysema of lung ([place])   ? Esophageal stricture   ? Gastritis   ? GERD (gastroesophageal reflux disease)   ? Hiatal hernia   ? Hx of colonic polyps   ? Hyperlipidemia   ? Hypertension   ? Internal hemorrhoids without mention of complication   ? Lumbago   ? Pain in both lower legs   ? PSA elevation   ? now averaging 2's  ? Tobacco abuse   ? URI (upper respiratory infection)   ? Urinary retention   ? ?Past Surgical History:  ?Procedure Laterality Date  ? 2D Echo  06/03  ? Abdominal ultrasound  06/03  ? Negative  ? BACK SURGERY    ? COLONOSCOPY    ? CT of head  and sinuses  06/03  ? Negative  ? CT of the chest, abdomen, pelvis  06/03  ? Chest negative; Abdomen, left adrenal lesion; Pelvis, enlarged prostate  ? ESOPHAGOGASTRODUODENOSCOPY  02/13/07  ? gastritis and duodenitis without bleed  ? ESOPHAGOGASTRODUODENOSCOPY N/A 06/26/2014  ? Procedure: ESOPHAGOGASTRODUODENOSCOPY (EGD);  Surgeon: Milus Banister, MD;  Location: [place];  Service: Endoscopy;  Laterality: N/A;  ? ESOPHAGOGASTRODUODENOSCOPY (EGD) WITH PROPOFOL N/A 04/08/2020  ? Procedure: ESOPHAGOGASTRODUODENOSCOPY (EGD) WITH PROPOFOL;  Surgeon: Ladene Artist, MD;  [facility]: [facility];  Service: Endoscopy;  Laterality: N/A;  ? HOT HEMOSTASIS  N/A 04/08/2020  ? Procedure: HOT HEMOSTASIS (ARGON PLASMA COAGULATION/BICAP);  Surgeon: Ladene Artist, MD;  Location: [facility];  Service: Endoscopy;  Laterality: N/A;  ? KNEE ARTHROSCOPY  10/00  ? Right  ? LOWER EXTREMITY ANGIOGRAPHY Right 12/27/2017  ? Procedure: LOWER EXTREMITY ANGIOGRAPHY;  Surgeon: Algernon Huxley, MD;  Location: [place] [facility];  Service: Cardiovascular;  Laterality: Right;  ? LOWER EXTREMITY ANGIOGRAPHY Left 01/10/2018  ? Procedure: LOWER EXTREMITY ANGIOGRAPHY;  Surgeon: Algernon Huxley, MD;  Location: [place] [facility];  Service: Cardiovascular;  Laterality: Left;  ? LOWER EXTREMITY ANGIOGRAPHY Left 05/29/2018  ? Procedure: LOWER EXTREMITY ANGIOGRAPHY;  Surgeon: Algernon Huxley,  MD;  Location: [place] [facility];  Service: Cardiovascular;  Laterality: Left;  ? LOWER EXTREMITY ANGIOGRAPHY Left 05/30/2018  ? Procedure: Lower Extremity Angiography;  Surgeon: Algernon Huxley, MD;  Location: [place] [facility];  Service: Cardiovascular;  Laterality: Left;  ? LP  06/03  ? Microwave thermotherapy prostate  08/28/07  ? Rogers Blocker  ? Persantine cardiolite  03/01  ? EF 68%  ? POLYPECTOMY  11/95  ? Stress myoview  07/06  ? EF 67%  ? UPPER GASTROINTESTINAL ENDOSCOPY    ? ?Patient Active Problem List  ? Diagnosis Date Noted  ? Gastric AVM   ? Iron deficiency anemia   ? Right buttock pain 01/12/2020  ? Fall at home, initial encounter 01/12/2020  ? Rash and nonspecific skin eruption 12/19/2019  ? Chronic pain of right hip 10/17/2019  ? Ischemic leg 05/29/2018  ? B12 deficiency 04/03/2018  ? PAD (peripheral artery disease) ([place]) 12/12/2017  ? Pain in limb 12/11/2017  ? GERD (gastroesophageal reflux disease) 04/30/2017  ? BPH with obstruction/lower urinary tract symptoms 10/14/2014  ? Essential hypertension 06/26/2014  ? Diabetes mellitus type 2, controlled ([place]) 06/26/2014  ? HLD (hyperlipidemia) 06/27/2006  ? Coronary atherosclerosis 06/27/2006  ? Tobacco abuse 06/27/2006  ? ?Family History  ?Problem Relation Age of Onset  ? Diabetes Father   ? Alcohol abuse Paternal Uncle   ? Alcohol abuse Paternal Uncle   ? Alcohol abuse Paternal Uncle   ? Rheum arthritis Mother   ? Heart disease Neg Hx   ? Stroke Neg Hx   ? Cancer Neg Hx   ? Drug abuse Neg Hx   ? Depression Neg Hx   ? Colon cancer Neg Hx   ? Rectal cancer Neg Hx   ? Stomach cancer Neg Hx   ? Kidney cancer Neg Hx   ? Bladder Cancer Neg Hx   ? Prostate cancer Neg Hx   ? ?Social History  ? ?Tobacco Use  ? Smoking status: Every Day  ?  Packs/day: 2.00  ?  Years: 57.00  ?  Pack years: 114.00  ?  Types: Cigarettes  ? Smokeless tobacco: Former  ?  Types: Chew  ? Tobacco comments:  ?  0.5 PPD since 04/20/2021  ?Substance Use Topics  ? Alcohol use: Not Currently  ?   Alcohol/week: 0.0 standard drinks  ?  Comment: rare  ? ?No Known Allergies ? ?Current Meds  ?Medication Sig  ? acetaminophen (TYLENOL) 500 MG tablet Take 1,000 mg by mouth every 6 (six) hours. Rapid release  ? aspirin 81 MG tablet Take 81 mg by mouth daily.  ? atorvastatin (LIPITOR) 20 MG tablet TAKE 1 TABLET BY MOUTH DAILY IN THE EVENING FOR CHOLESTEROL. Office visit required for further refills.  ? cefUROXime (CEFTIN) 500 MG tablet Take 1 tablet (500 mg  total) by mouth 2 (two) times daily with a meal for 7 days.  ? clopidogrel (PLAVIX) 75 MG tablet Take 1 tablet (75 mg total) by mouth daily.  ? ferrous gluconate (FERGON) 324 MG tablet TAKE 1 TABLET BY MOUTH EVERY DAY WITH BREAKFAST  ? finasteride (PROSCAR) 5 MG tablet TAKE 1 TABLET BY MOUTH EVERY DAY  ? glipiZIDE (GLUCOTROL) 10 MG tablet Take 10 mg by mouth daily before supper.   ? GLUCOSAMINE HCL PO Take 1 tablet by mouth daily.  ? insulin detemir (LEVEMIR) 100 UNIT/ML injection Inject 20 units at bedtime. (Patient taking differently: Inject 15 Units into the skin daily.)  ? lisinopril-hydrochlorothiazide (ZESTORETIC) 20-25 MG tablet TAKE 1 TABLET BY MOUTH DAILY. FOR BLOOD PRESSURE. OFFICE VISIT REQUIRED FOR FURTHER REFILLS.  ? metFORMIN (GLUCOPHAGE) 500 MG tablet TAKE 2 TABLETS BY MOUTH TWICE A DAY WITH MEAL FOR DIABETES  ? metoprolol tartrate (LOPRESSOR) 50 MG tablet TAKE 1/2 TABLET BY MOUTH TWICE A DAY FOR BLOOD PRESSURE. OFFICE VISIT REQUIRED.  ? pantoprazole (PROTONIX) 40 MG tablet Take 1 tablet (40 mg total) by mouth daily. For heartburn. Office visit required for further refills.  ? sildenafil (VIAGRA) 100 MG tablet Take 1 tablet (100 mg total) by mouth daily as needed for erectile dysfunction.  ? tamsulosin (FLOMAX) 0.4 MG CAPS capsule TAKE 1 CAPSULE BY MOUTH 2 TIMES DAILY.  ? vitamin B-12 (CYANOCOBALAMIN) 1000 MCG tablet Take 1,000 mcg by mouth daily.  ? [DISCONTINUED] buPROPion (WELLBUTRIN SR) 150 MG 12 hr tablet One tab daily for 5 days, twice a day  starting on day 6.  ? [DISCONTINUED] tiZANidine (ZANAFLEX) 4 MG tablet TAKE 1 TABLET (4 MG TOTAL) BY MOUTH 2 (TWO) TIMES DAILY AS NEEDED FOR MUSCLE SPASMS.  ? [DISCONTINUED] triamcinolone cream (KENALOG)

## 2021-05-06 NOTE — Patient Instructions (Addendum)
We have scheduled your procedure for 12 April at 1230.  You will get instructions about preoperative evaluation. ? ?I have cleared it with Dr. Lucky Cowboy that you can stop your Plavix (blood thinner) 5 days prior to the procedure. ? ?Giving you a trial of Trelegy is an inhaler that you will use 1 puff daily, make sure you rinse your mouth well after you use it. ? ?We will see you in follow-up in 6 to 8 weeks time. ? ? ?

## 2021-05-06 NOTE — H&P (View-Only) (Signed)
? ?Subjective:  ? ? Patient ID: Antonio [name], male    DOB: 1945-04-22, 76 y.o.   MRN: [id number] ?Patient Care Team: ?Pleas Koch, NP as PCP - General (Internal Medicine) ?Laneta Simmers as Physician Assistant (Urology) ?Ladene Artist, MD as Consulting Physician (Gastroenterology) ?[name], [facility] as Pharmacist (Pharmacist) ? ?Chief Complaint  ?Patient presents with  ? Consult  ?  Lung nodule found on lung cancer screening  ? ?HPI ?This is a 76 year old current smoker (half PPD, 114 PY) who presents for evaluation of a left perihilar lung nodule found during routine lung cancer screening.  Patient is kindly referred by Alma Friendly, NP.  Patient has been asymptomatic with regards to this nodule.  He has not had any hemoptysis.  No weight loss or anorexia.  He has chronic morning cough productive of clear to whitish sputum.  This has been going on for a number of years and is unchanged in quality.  He has not had any orthopnea or paroxysmal nocturnal dyspnea.  No chest pain, no lower extremity edema nor calf tenderness.  He notes some shortness of breath with walking up inclines or picking up heavy objects.  This has also been going on for a number of years and he had attributed to being "out of shape".  He occasionally notices wheezing.  He is not on any inhalers.  Does recall a history of asthma in childhood. ? ?He has no military history.  Did work in [employer] until retirement several years ago.  As noted he has smoked 2 packs of cigarettes per day up until recently (few weeks ago) when he decreased to half a pack of cigarettes per day. ? ? ?Review of Systems ?A 10 point review of systems was performed and it is as noted above otherwise negative. ? ?Past Medical History:  ?Diagnosis Date  ? Arthritis   ? Asthma   ? age 39  ? BPH with urinary obstruction   ? stable on flomax ([name]  ? Coronary atherosclerosis of unspecified type of vessel, native or graft   ? Diabetes mellitus    ? Type II  ? Emphysema of lung ([place])   ? Esophageal stricture   ? Gastritis   ? GERD (gastroesophageal reflux disease)   ? Hiatal hernia   ? Hx of colonic polyps   ? Hyperlipidemia   ? Hypertension   ? Internal hemorrhoids without mention of complication   ? Lumbago   ? Pain in both lower legs   ? PSA elevation   ? now averaging 2's  ? Tobacco abuse   ? URI (upper respiratory infection)   ? Urinary retention   ? ?Past Surgical History:  ?Procedure Laterality Date  ? 2D Echo  06/03  ? Abdominal ultrasound  06/03  ? Negative  ? BACK SURGERY    ? COLONOSCOPY    ? CT of head  and sinuses  06/03  ? Negative  ? CT of the chest, abdomen, pelvis  06/03  ? Chest negative; Abdomen, left adrenal lesion; Pelvis, enlarged prostate  ? ESOPHAGOGASTRODUODENOSCOPY  02/13/07  ? gastritis and duodenitis without bleed  ? ESOPHAGOGASTRODUODENOSCOPY N/A 06/26/2014  ? Procedure: ESOPHAGOGASTRODUODENOSCOPY (EGD);  Surgeon: Milus Banister, MD;  Location: [place];  Service: Endoscopy;  Laterality: N/A;  ? ESOPHAGOGASTRODUODENOSCOPY (EGD) WITH PROPOFOL N/A 04/08/2020  ? Procedure: ESOPHAGOGASTRODUODENOSCOPY (EGD) WITH PROPOFOL;  Surgeon: Ladene Artist, MD;  [facility]: [facility];  Service: Endoscopy;  Laterality: N/A;  ? HOT HEMOSTASIS  N/A 04/08/2020  ? Procedure: HOT HEMOSTASIS (ARGON PLASMA COAGULATION/BICAP);  Surgeon: Ladene Artist, MD;  Location: [facility];  Service: Endoscopy;  Laterality: N/A;  ? KNEE ARTHROSCOPY  10/00  ? Right  ? LOWER EXTREMITY ANGIOGRAPHY Right 12/27/2017  ? Procedure: LOWER EXTREMITY ANGIOGRAPHY;  Surgeon: Algernon Huxley, MD;  Location: [place] [facility];  Service: Cardiovascular;  Laterality: Right;  ? LOWER EXTREMITY ANGIOGRAPHY Left 01/10/2018  ? Procedure: LOWER EXTREMITY ANGIOGRAPHY;  Surgeon: Algernon Huxley, MD;  Location: [place] [facility];  Service: Cardiovascular;  Laterality: Left;  ? LOWER EXTREMITY ANGIOGRAPHY Left 05/29/2018  ? Procedure: LOWER EXTREMITY ANGIOGRAPHY;  Surgeon: Algernon Huxley,  MD;  Location: [place] [facility];  Service: Cardiovascular;  Laterality: Left;  ? LOWER EXTREMITY ANGIOGRAPHY Left 05/30/2018  ? Procedure: Lower Extremity Angiography;  Surgeon: Algernon Huxley, MD;  Location: [place] [facility];  Service: Cardiovascular;  Laterality: Left;  ? LP  06/03  ? Microwave thermotherapy prostate  08/28/07  ? Rogers Blocker  ? Persantine cardiolite  03/01  ? EF 68%  ? POLYPECTOMY  11/95  ? Stress myoview  07/06  ? EF 67%  ? UPPER GASTROINTESTINAL ENDOSCOPY    ? ?Patient Active Problem List  ? Diagnosis Date Noted  ? Gastric AVM   ? Iron deficiency anemia   ? Right buttock pain 01/12/2020  ? Fall at home, initial encounter 01/12/2020  ? Rash and nonspecific skin eruption 12/19/2019  ? Chronic pain of right hip 10/17/2019  ? Ischemic leg 05/29/2018  ? B12 deficiency 04/03/2018  ? PAD (peripheral artery disease) ([place]) 12/12/2017  ? Pain in limb 12/11/2017  ? GERD (gastroesophageal reflux disease) 04/30/2017  ? BPH with obstruction/lower urinary tract symptoms 10/14/2014  ? Essential hypertension 06/26/2014  ? Diabetes mellitus type 2, controlled ([place]) 06/26/2014  ? HLD (hyperlipidemia) 06/27/2006  ? Coronary atherosclerosis 06/27/2006  ? Tobacco abuse 06/27/2006  ? ?Family History  ?Problem Relation Age of Onset  ? Diabetes Father   ? Alcohol abuse Paternal Uncle   ? Alcohol abuse Paternal Uncle   ? Alcohol abuse Paternal Uncle   ? Rheum arthritis Mother   ? Heart disease Neg Hx   ? Stroke Neg Hx   ? Cancer Neg Hx   ? Drug abuse Neg Hx   ? Depression Neg Hx   ? Colon cancer Neg Hx   ? Rectal cancer Neg Hx   ? Stomach cancer Neg Hx   ? Kidney cancer Neg Hx   ? Bladder Cancer Neg Hx   ? Prostate cancer Neg Hx   ? ?Social History  ? ?Tobacco Use  ? Smoking status: Every Day  ?  Packs/day: 2.00  ?  Years: 57.00  ?  Pack years: 114.00  ?  Types: Cigarettes  ? Smokeless tobacco: Former  ?  Types: Chew  ? Tobacco comments:  ?  0.5 PPD since 04/20/2021  ?Substance Use Topics  ? Alcohol use: Not Currently  ?   Alcohol/week: 0.0 standard drinks  ?  Comment: rare  ? ?No Known Allergies ? ?Current Meds  ?Medication Sig  ? acetaminophen (TYLENOL) 500 MG tablet Take 1,000 mg by mouth every 6 (six) hours. Rapid release  ? aspirin 81 MG tablet Take 81 mg by mouth daily.  ? atorvastatin (LIPITOR) 20 MG tablet TAKE 1 TABLET BY MOUTH DAILY IN THE EVENING FOR CHOLESTEROL. Office visit required for further refills.  ? cefUROXime (CEFTIN) 500 MG tablet Take 1 tablet (500 mg  total) by mouth 2 (two) times daily with a meal for 7 days.  ? clopidogrel (PLAVIX) 75 MG tablet Take 1 tablet (75 mg total) by mouth daily.  ? ferrous gluconate (FERGON) 324 MG tablet TAKE 1 TABLET BY MOUTH EVERY DAY WITH BREAKFAST  ? finasteride (PROSCAR) 5 MG tablet TAKE 1 TABLET BY MOUTH EVERY DAY  ? glipiZIDE (GLUCOTROL) 10 MG tablet Take 10 mg by mouth daily before supper.   ? GLUCOSAMINE HCL PO Take 1 tablet by mouth daily.  ? insulin detemir (LEVEMIR) 100 UNIT/ML injection Inject 20 units at bedtime. (Patient taking differently: Inject 15 Units into the skin daily.)  ? lisinopril-hydrochlorothiazide (ZESTORETIC) 20-25 MG tablet TAKE 1 TABLET BY MOUTH DAILY. FOR BLOOD PRESSURE. OFFICE VISIT REQUIRED FOR FURTHER REFILLS.  ? metFORMIN (GLUCOPHAGE) 500 MG tablet TAKE 2 TABLETS BY MOUTH TWICE A DAY WITH MEAL FOR DIABETES  ? metoprolol tartrate (LOPRESSOR) 50 MG tablet TAKE 1/2 TABLET BY MOUTH TWICE A DAY FOR BLOOD PRESSURE. OFFICE VISIT REQUIRED.  ? pantoprazole (PROTONIX) 40 MG tablet Take 1 tablet (40 mg total) by mouth daily. For heartburn. Office visit required for further refills.  ? sildenafil (VIAGRA) 100 MG tablet Take 1 tablet (100 mg total) by mouth daily as needed for erectile dysfunction.  ? tamsulosin (FLOMAX) 0.4 MG CAPS capsule TAKE 1 CAPSULE BY MOUTH 2 TIMES DAILY.  ? vitamin B-12 (CYANOCOBALAMIN) 1000 MCG tablet Take 1,000 mcg by mouth daily.  ? [DISCONTINUED] buPROPion (WELLBUTRIN SR) 150 MG 12 hr tablet One tab daily for 5 days, twice a day  starting on day 6.  ? [DISCONTINUED] tiZANidine (ZANAFLEX) 4 MG tablet TAKE 1 TABLET (4 MG TOTAL) BY MOUTH 2 (TWO) TIMES DAILY AS NEEDED FOR MUSCLE SPASMS.  ? [DISCONTINUED] triamcinolone cream (KENALOG)

## 2021-05-06 NOTE — Telephone Encounter (Signed)
Pt scheduled for April 12th @ 12:30.dx lung nodule in left lobe. CPT code:31627,31899.  ?Antonio [name] please see bronch information.  ?

## 2021-05-06 NOTE — Telephone Encounter (Signed)
Pre admit phone visit 05/11/2021 between 8-1 and covid test 05/16/21 at 9:00a ? ?Spoke to patient and relayed above dates/times. He voiced his understanding.  ? ? ?

## 2021-05-06 NOTE — Telephone Encounter (Signed)
Prior Auth Not  Required because the patient has Medicare A &B and a BCBS supplement plan ?

## 2021-05-09 ENCOUNTER — Ambulatory Visit
Admission: RE | Admit: 2021-05-09 | Discharge: 2021-05-09 | Disposition: A | Discharge status: Home / Self Care | Payer: Medicare Other | Source: Ambulatory Visit | Attending: Pulmonary Disease | Admitting: Pulmonary Disease

## 2021-05-09 DIAGNOSIS — R918 Other nonspecific abnormal finding of lung field: Secondary | ICD-10-CM | POA: Diagnosis not present

## 2021-05-09 DIAGNOSIS — R911 Solitary pulmonary nodule: Secondary | ICD-10-CM | POA: Insufficient documentation

## 2021-05-11 ENCOUNTER — Other Ambulatory Visit
Admission: RE | Admit: 2021-05-11 | Discharge: 2021-05-11 | Disposition: A | Discharge status: Home / Self Care | Payer: Medicare Other | Source: Ambulatory Visit | Attending: Pulmonary Disease | Admitting: Pulmonary Disease

## 2021-05-11 ENCOUNTER — Other Ambulatory Visit: Payer: Self-pay

## 2021-05-11 DIAGNOSIS — Z01812 Encounter for preprocedural laboratory examination: Secondary | ICD-10-CM

## 2021-05-11 DIAGNOSIS — I10 Essential (primary) hypertension: Secondary | ICD-10-CM

## 2021-05-11 DIAGNOSIS — D508 Other iron deficiency anemias: Secondary | ICD-10-CM

## 2021-05-11 DIAGNOSIS — Z01810 Encounter for preprocedural cardiovascular examination: Secondary | ICD-10-CM | POA: Diagnosis not present

## 2021-05-11 DIAGNOSIS — Z1152 Encounter for screening for COVID-19: Secondary | ICD-10-CM

## 2021-05-11 HISTORY — DX: Pneumonia, unspecified organism: J18.9

## 2021-05-11 HISTORY — DX: Anemia, unspecified: D64.9

## 2021-05-11 LAB — CBC
HCT: 38.8 % — ABNORMAL LOW (ref 39.0–52.0)
Hemoglobin: 12.8 g/dL — ABNORMAL LOW (ref 13.0–17.0)
MCH: 30.5 pg (ref 26.0–34.0)
MCHC: 33 g/dL (ref 30.0–36.0)
MCV: 92.6 fL (ref 80.0–100.0)
Platelets: 280 10*3/uL (ref 150–400)
RBC: 4.19 MIL/uL — ABNORMAL LOW (ref 4.22–5.81)
RDW: 13.7 % (ref 11.5–15.5)
WBC: 9.3 10*3/uL (ref 4.0–10.5)
nRBC: 0 % (ref 0.0–0.2)

## 2021-05-11 LAB — BASIC METABOLIC PANEL
Anion gap: 8 (ref 5–15)
BUN: 24 mg/dL — ABNORMAL HIGH (ref 8–23)
CO2: 25 mmol/L (ref 22–32)
Calcium: 8.9 mg/dL (ref 8.9–10.3)
Chloride: 107 mmol/L (ref 98–111)
Creatinine, Ser: 0.72 mg/dL (ref 0.61–1.24)
GFR, Estimated: >60 mL/min (ref >60)
Glucose, Bld: 117 mg/dL — ABNORMAL HIGH (ref 70–99)
Potassium: 4.1 mmol/L (ref 3.5–5.1)
Sodium: 140 mmol/L (ref 135–145)

## 2021-05-11 NOTE — Patient Instructions (Addendum)
Your procedure is scheduled on: 05/18/21 - Wednesday ?Report to the Registration Desk on the 1st floor of the House. ?To find out your arrival time, please call [phone] between 1PM - 3PM on: 05/17/21 - Tuesday ? ?REMEMBER: ?Instructions that are not followed completely may result in serious medical risk, up to and including death; or upon the discretion of your surgeon and anesthesiologist your surgery may need to be rescheduled. ? ?Do not eat food or drink any fluids after midnight the night before surgery.  ?No gum chewing, lozengers or hard candies. ? ? ?TAKE ONLY THESE MEDICATIONS THE MORNING OF SURGERY WITH A SIP OF WATER: ? ?- amLODipine (NORVASC) 5 MG tablet ?- finasteride (PROSCAR) 5 MG tablet ?- Fluticasone-Umeclidin-Vilant (TRELEGY ELLIPTA) 100-62.5-25 MCG/ACT AEPB ?- metoprolol tartrate (LOPRESSOR) 50 MG tablet ?- pantoprazole (PROTONIX) 40 MG tablet, (take one the night before and one on the morning of surgery - helps to prevent nausea after surgery.) ?- tamsulosin (FLOMAX) 0.4 MG CAPS capsule ? ?Follow recommendations from Cardiologist, Pulmonologist or PCP regarding stopping Aspirin, Coumadin, Plavix, Eliquis, Pradaxa, or Pletal. Stop taking Plavix beginning 05/13/21, resume taking with MD order. ? ?- insulin detemir (LEVEMIR) 100 UNIT/ML injection - inject NONE the morning of procedure. ? ?- metFORMIN (GLUCOPHAGE) 500 MG tablet - stop taking on 05/16/21, may resume taking the day after procedure. ? ?One week prior to surgery: ?Stop Anti-inflammatories (NSAIDS) such as Advil, Aleve, Ibuprofen, Motrin, Naproxen, Naprosyn and Aspirin based products such as Excedrin, Goodys Powder, BC Powder. ? ?Stop ANY OVER THE COUNTER supplements until after surgery.GLUCOSAMINE HCL PO ? ?You may however, continue to take Tylenol if needed for pain up until the day of surgery. ? ?No Alcohol for 24 hours before or after surgery. ? ?No Smoking including e-cigarettes for 24 hours prior to surgery.  ?No  chewable tobacco products for at least 6 hours prior to surgery.  ?No nicotine patches on the day of surgery. ? ?Do not use any "recreational" drugs for at least a week prior to your surgery.  ?Please be advised that the combination of cocaine and anesthesia may have negative outcomes, up to and including death. ?If you test positive for cocaine, your surgery will be cancelled. ? ?On the morning of surgery brush your teeth with toothpaste and water, you may rinse your mouth with mouthwash if you wish. ?Do not swallow any toothpaste or mouthwash. ? ?Do not wear jewelry, make-up, hairpins, clips or nail polish. ? ?Do not wear lotions, powders, or perfumes.  ? ?Do not shave body from the neck down 48 hours prior to surgery just in case you cut yourself which could leave a site for infection.  ?Also, freshly shaved skin may become irritated if using the CHG soap. ? ?Contact lenses, hearing aids and dentures may not be worn into surgery. ? ?Do not bring valuables to the hospital. [facility] is not responsible for any missing/lost belongings or valuables.  ? ?Notify your doctor if there is any change in your medical condition (cold, fever, infection). ? ?Wear comfortable clothing (specific to your surgery type) to the hospital. ? ?After surgery, you can help prevent lung complications by doing breathing exercises.  ?Take deep breaths and cough every 1-2 hours. Your doctor may order a device called an Incentive Spirometer to help you take deep breaths. ?When coughing or sneezing, hold a pillow firmly against your incision with both hands. This is called ?splinting.? Doing this helps protect your incision. It also decreases belly discomfort. ? ?  If you are being admitted to the hospital overnight, leave your suitcase in the car. ?After surgery it may be brought to your room. ? ?If you are being discharged the day of surgery, you will not be allowed to drive home. ?You will need a responsible adult (18 years or older) to  drive you home and stay with you that night.  ? ?If you are taking public transportation, you will need to have a responsible adult (18 years or older) with you. ?Please confirm with your physician that it is acceptable to use public transportation.  ? ?Please call the [facility] at [phone] if you have any questions about these instructions. ? ?Surgery Visitation Policy: ? ?Patients undergoing a surgery or procedure may have two family members or support persons with them as long as the person is not COVID-19 positive or experiencing its symptoms.  ? ?Inpatient Visitation:   ? ?Visiting hours are 7 a.m. to 8 p.m. ?Up to four visitors are allowed at one time in a patient room, including children. The visitors may rotate out with other people during the day. One designated support person (adult) may remain overnight.  ?

## 2021-05-12 ENCOUNTER — Encounter: Payer: Self-pay | Admitting: Primary Care

## 2021-05-12 ENCOUNTER — Ambulatory Visit (INDEPENDENT_AMBULATORY_CARE_PROVIDER_SITE_OTHER): Payer: Medicare Other | Admitting: Primary Care

## 2021-05-12 ENCOUNTER — Other Ambulatory Visit: Payer: Medicare Other

## 2021-05-12 ENCOUNTER — Telehealth: Payer: Self-pay | Admitting: Cardiovascular Disease

## 2021-05-12 VITALS — BP 140/60 | HR 65 | Ht 66.0 in | Wt 164.4 lb

## 2021-05-12 DIAGNOSIS — G8929 Other chronic pain: Secondary | ICD-10-CM

## 2021-05-12 DIAGNOSIS — E11618 Type 2 diabetes mellitus with other diabetic arthropathy: Secondary | ICD-10-CM

## 2021-05-12 DIAGNOSIS — K2100 Gastro-esophageal reflux disease with esophagitis, without bleeding: Secondary | ICD-10-CM

## 2021-05-12 DIAGNOSIS — E7800 Pure hypercholesterolemia, unspecified: Secondary | ICD-10-CM

## 2021-05-12 DIAGNOSIS — I739 Peripheral vascular disease, unspecified: Secondary | ICD-10-CM | POA: Diagnosis not present

## 2021-05-12 DIAGNOSIS — Z794 Long term (current) use of insulin: Secondary | ICD-10-CM

## 2021-05-12 DIAGNOSIS — N390 Urinary tract infection, site not specified: Secondary | ICD-10-CM | POA: Diagnosis not present

## 2021-05-12 DIAGNOSIS — N138 Other obstructive and reflux uropathy: Secondary | ICD-10-CM | POA: Diagnosis not present

## 2021-05-12 DIAGNOSIS — Z720 Tobacco use: Secondary | ICD-10-CM | POA: Diagnosis not present

## 2021-05-12 DIAGNOSIS — M25551 Pain in right hip: Secondary | ICD-10-CM

## 2021-05-12 DIAGNOSIS — I2510 Atherosclerotic heart disease of native coronary artery without angina pectoris: Secondary | ICD-10-CM | POA: Diagnosis not present

## 2021-05-12 DIAGNOSIS — I10 Essential (primary) hypertension: Secondary | ICD-10-CM

## 2021-05-12 DIAGNOSIS — N401 Enlarged prostate with lower urinary tract symptoms: Secondary | ICD-10-CM

## 2021-05-12 LAB — POCT GLYCOSYLATED HEMOGLOBIN (HGB A1C): Hemoglobin A1C: 7.4 % — AB (ref 4.0–5.6)

## 2021-05-12 MED ORDER — LISINOPRIL-HYDROCHLOROTHIAZIDE 20-25 MG PO TABS: 1.0000 | ORAL_TABLET | Freq: Every day | ORAL | 3 refills | Status: DC

## 2021-05-12 NOTE — Assessment & Plan Note (Signed)
A1c is 7.4 today, slight increase from last fall. ? ?Continue metformin 1000 mg twice daily, glipizide 10 mg twice daily, Levemir 20 units daily. ? ?Following with endocrinology. ?Office visit reviewed from October 2022 through care everywhere. ?

## 2021-05-12 NOTE — Telephone Encounter (Signed)
? ?  Patient Name: Antonio [name]  ?DOB: [birth year] ?MRN: [id number] ? ?Primary Cardiologist: None ? ?Chart reviewed as part of pre-operative protocol coverage. The patient has an upcoming visit scheduled with Dr. Garen Lah on 05/13/2021 to establish care at which time clearance can be addressed in case there are any issues that would impact surgical recommendations. ? ?Robotic assisted video bronchoscopy scheduled for 05/18/2021 as below.  I added preop FYI to appointment note so that provider is aware to address at time of outpatient visit.  Per office protocol the cardiology provider should for their finalize clearance decision and recommendations regarding antiplatelet therapy to requesting party below. ? ?I will route this message as FYI to requesting party and remove this message from the preop box as separate preop APP input not needed at this time.  Please call with any questions. ? ? ?Lenna Sciara, NP ?05/12/2021, 12:42 PM ? ?

## 2021-05-12 NOTE — Assessment & Plan Note (Signed)
Commended him on cutting back, encouraged complete cessation. ?

## 2021-05-12 NOTE — Assessment & Plan Note (Signed)
Lipid panel reviewed from care everywhere from October 2022. ? ?Continue atorvastatin 20 mg daily. ?

## 2021-05-12 NOTE — Progress Notes (Signed)
? ?Subjective:  ? ? Patient ID: Antonio [name], male    DOB: [birth year], 76 y.o.   MRN: [id number] ? ?HPI ? ?Antonio [name] is a very pleasant 76 y.o. male with a history of hypertension, PAD, type 2 diabetes, BPH, hyperlipidemia, tobacco abuse, chronic right hip pain, CAD who presents today for follow-up of chronic conditions. ? ?1) CAD/Hyperlipidemia/PAD/Hypertension: Currently managed on amlodipine 5 mg daily, aspirin 81 mg daily, atorvastatin 20 mg daily, clopidogrel 75 mg daily, metoprolol tartrate 50 mg daily. ? ?Follows with vascular services, last office visit was in April 2022.  No longer on Eliquis, has switched over to clopidogrel 75 mg with aspirin 81 mg daily. ? ?2) BPH: Following with Urology, last visit was 04/27/2021. Currently managed on tamsulosin 0.8 mg daily, finasteride 5 mg daily.  Recently treated for UTI with hematuria, symptoms abated. Recent PET scan without abnormality to the bladder. ? ?3) COPD/Tobacco Abuse: Following with pulmonology, last office visit was 1 week ago.  Also with recent discovery of left perihilar lung nodule which was discovered on routine lung cancer screening.  He is currently pending robotic assisted bronchoscopy for further evaluation. ? ?He underwent CT scan on 05/09/21 which reveled new tiny nodules to right middle lobe and right lower lobe. He denies fevers, chills, increased cough.  ? ?Currently managed on Trelegy Ellipta 100 mg daily, this has significantly helped with chronic cough and dyspnea.  He will undergo PFTs soon. ? ?He continues to smoke, is down to 1 pack ever three days. He was managed on Wellbutrin but is not taking as it "makes me feel loopy".  ? ?4) Type 2 Diabetes: Following with endocrinology. Currently managed on glipizide 10 mg BID, metformin 1000 mg BID, Levemir 20 units daily. A1C today of 7.4 which is an increased from 6.8 in October 2022. ? ?He monitors his glucose readings regularly, denies hypoglycemic episodes.  ? ?5) Chronic Hip Pain:  Chronic for years. Currently managed on Tylenol for he takes daily. Previously taking Tizanidine 4 mg PRN, this didn't help with symptoms. He finds most of his relief with the Tylenol.  ? ?BP Readings from Last 3 Encounters:  ?05/12/21 140/60  ?05/06/21 118/80  ?04/27/21 (!) 151/61  ? ? ? ?Review of Systems  ?Respiratory:  Positive for cough. Negative for shortness of breath.   ?Cardiovascular:  Negative for chest pain.  ?Musculoskeletal:  Positive for arthralgias.  ?Allergic/Immunologic: Positive for environmental allergies.  ? ?   ? ? ?Past Medical History:  ?Diagnosis Date  ? Anemia   ? Arthritis   ? Asthma   ? age 79  ? BPH with urinary obstruction   ? stable on flomax ([name]  ? Coronary atherosclerosis of unspecified type of vessel, native or graft   ? 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 Emphysema of lung ([place])   ? Esophageal stricture   ? Gastritis   ? GERD (gastroesophageal reflux disease)   ? Hiatal hernia   ? Hx of colonic polyps   ? Hyperlipidemia   ? Hypertension   ? Internal hemorrhoids without mention of complication   ? Lumbago   ? Pain in both lower legs   ? Pneumonia   ? PSA elevation   ? now averaging 2's  ? Tobacco abuse   ? URI (upper respiratory infection)   ? Urinary retention   ? ? ?Social History  ? ?Socioeconomic History  ? Marital status: Widowed  ?  Spouse name: [name]  ? Number  of children: 3  ? Years of education: Not on file  ? Highest education level: Not on file  ?Occupational History  ? Occupation: Retired Games developer  ?Tobacco Use  ? Smoking status: Every Day  ?  Packs/day: 2.00  ?  Years: 57.00  ?  Pack years: 114.00  ?  Types: Cigarettes  ? Smokeless tobacco: Former  ?  Types: Chew  ? Tobacco comments:  ?  0.5 PPD since 04/20/2021  ?Vaping Use  ? Vaping Use: Never used  ?Substance and Sexual Activity  ? Alcohol use: Not Currently  ?  Alcohol/week: 0.0 standard drinks  ?  Comment: rare  ? Drug use: No  ? Sexual activity: Yes  ?Other Topics Concern  ? Not on file  ?Social History  Narrative  ? Married 9/09 wife with moderate memory problems.  ? 3 adult children, 2 grandchildren  ? Would desire CPR  ? ?Social Determinants of Health  ? ?Financial Resource Strain: Low Risk   ? Difficulty of Paying Living Expenses: Not very hard  ?Food Insecurity: No Food Insecurity  ? Worried About Charity fundraiser in the Last Year: Never true  ? Ran Out of Food in the Last Year: Never true  ?Transportation Needs: No Transportation Needs  ? Lack of Transportation (Medical): No  ? Lack of Transportation (Non-Medical): No  ?Physical Activity: Inactive  ? Days of Exercise per Week: 0 days  ? Minutes of Exercise per Session: 0 min  ?Stress: No Stress Concern Present  ? Feeling of Stress : Not at all  ?Social Connections: Not on file  ?Intimate Partner Violence: Not At Risk  ? Fear of Current or Ex-Partner: No  ? Emotionally Abused: No  ? Physically Abused: No  ? Sexually Abused: No  ? ? ?Past Surgical History:  ?Procedure Laterality Date  ? 2D Echo  06/03  ? Abdominal ultrasound  06/03  ? Negative  ? BACK SURGERY    ? COLONOSCOPY    ? CT of head  and sinuses  06/03  ? Negative  ? CT of the chest, abdomen, pelvis  06/03  ? Chest negative; Abdomen, left adrenal lesion; Pelvis, enlarged prostate  ? ESOPHAGOGASTRODUODENOSCOPY  02/13/07  ? gastritis and duodenitis without bleed  ? ESOPHAGOGASTRODUODENOSCOPY N/A 06/26/2014  ? Procedure: ESOPHAGOGASTRODUODENOSCOPY (EGD);  Surgeon: Milus Banister, MD;  Location: [place];  Service: Endoscopy;  Laterality: N/A;  ? ESOPHAGOGASTRODUODENOSCOPY (EGD) WITH PROPOFOL N/A 04/08/2020  ? Procedure: ESOPHAGOGASTRODUODENOSCOPY (EGD) WITH PROPOFOL;  Surgeon: Ladene Artist, MD;  [facility]: [facility];  Service: Endoscopy;  Laterality: N/A;  ? HOT HEMOSTASIS N/A 04/08/2020  ? Procedure: HOT HEMOSTASIS (ARGON PLASMA COAGULATION/BICAP);  Surgeon: Ladene Artist, MD;  Location: [facility];  Service: Endoscopy;  Laterality: N/A;  ? KNEE ARTHROSCOPY  10/00  ? Right  ? LOWER EXTREMITY  ANGIOGRAPHY Right 12/27/2017  ? Procedure: LOWER EXTREMITY ANGIOGRAPHY;  Surgeon: Algernon Huxley, MD;  Location: [place] [facility];  Service: Cardiovascular;  Laterality: Right;  ? LOWER EXTREMITY ANGIOGRAPHY Left 01/10/2018  ? Procedure: LOWER EXTREMITY ANGIOGRAPHY;  Surgeon: Algernon Huxley, MD;  Location: [place] [facility];  Service: Cardiovascular;  Laterality: Left;  ? LOWER EXTREMITY ANGIOGRAPHY Left 05/29/2018  ? Procedure: LOWER EXTREMITY ANGIOGRAPHY;  Surgeon: Algernon Huxley, MD;  Location: [place] [facility];  Service: Cardiovascular;  Laterality: Left;  ? LOWER EXTREMITY ANGIOGRAPHY Left 05/30/2018  ? Procedure: Lower Extremity Angiography;  Surgeon: Algernon Huxley, MD;  Location: [facility];  Service: Cardiovascular;  Laterality: Left;  ? LP  06/03  ? Microwave thermotherapy prostate  08/28/07  ? Rogers Blocker  ? Persantine cardiolite  03/01  ? EF 68%  ? POLYPECTOMY  11/95  ? Stress myoview  07/06  ? EF 67%  ? UPPER GASTROINTESTINAL ENDOSCOPY    ? ? ?Family History  ?Problem Relation Age of Onset  ? Diabetes Father   ? Alcohol abuse Paternal Uncle   ? Alcohol abuse Paternal Uncle   ? Alcohol abuse Paternal Uncle   ? Rheum arthritis Mother   ? Heart disease Neg Hx   ? Stroke Neg Hx   ? Cancer Neg Hx   ? Drug abuse Neg Hx   ? Depression Neg Hx   ? Colon cancer Neg Hx   ? Rectal cancer Neg Hx   ? Stomach cancer Neg Hx   ? Kidney cancer Neg Hx   ? Bladder Cancer Neg Hx   ? Prostate cancer Neg Hx   ? ? ?No Known Allergies ? ?Current Outpatient Medications on File Prior to Visit  ?Medication Sig Dispense Refill  ? acetaminophen (TYLENOL) 500 MG tablet Take 1,000 mg by mouth every 6 (six) hours as needed (hip pain.). Rapid release    ? amLODipine (NORVASC) 5 MG tablet Take 5 mg by mouth daily.    ? aspirin 81 MG EC tablet Take 81 mg by mouth in the morning.    ? atorvastatin (LIPITOR) 20 MG tablet TAKE 1 TABLET BY MOUTH DAILY IN THE EVENING FOR CHOLESTEROL. Office visit required for further refills. 90 tablet 0  ?  clopidogrel (PLAVIX) 75 MG tablet Take 1 tablet (75 mg total) by mouth daily. 30 tablet 6  ? ferrous gluconate (FERGON) 324 MG tablet TAKE 1 TABLET BY MOUTH EVERY DAY WITH BREAKFAST 30 tablet 0  ? FIBER PO T

## 2021-05-12 NOTE — Patient Instructions (Signed)
It was a pleasure to see you today!   

## 2021-05-12 NOTE — Assessment & Plan Note (Signed)
Stable, no concerns today. ? ?Continue pantoprazole 40 mg daily ?

## 2021-05-12 NOTE — Assessment & Plan Note (Addendum)
Slightly elevated today. ? ?Continue lisinopril-hydrochlorothiazide 20-25 mg daily, amlodipine 5 mg daily, metoprolol tartrate 25 mg twice daily. ? ?BMP reviewed from yesterday ? ?

## 2021-05-12 NOTE — Assessment & Plan Note (Signed)
Following with vascular services, office notes and ABIs from 2022 reviewed. ? ?Commended him on cutting back on cigarettes. ? ?Continue aspirin 81 mg daily, clopidogrel 75 mg daily, diabetes control, blood pressure control, lipid control. ?

## 2021-05-12 NOTE — Assessment & Plan Note (Addendum)
Following with vascular services, office notes and ABIs from 2022 reviewed. ? ?Continue blood pressure control, aspirin 81 mg daily, clopidogrel 75 mg daily, lipid control, diabetes control. ? ?Commended him on cutting back on cigarette use, encouraged to quit. ?

## 2021-05-12 NOTE — Assessment & Plan Note (Signed)
Following with urology. ?Office notes reviewed from March 2023. ? ?PET scan reviewed from March 2023. ? ?Continue tamsulosin 0.8 mg daily, finasteride 5 mg daily. ?

## 2021-05-12 NOTE — Assessment & Plan Note (Signed)
Overall controlled with Tylenol. ? ?Reviewed plain films from 2021. ?Continue to monitor. ?

## 2021-05-12 NOTE — Telephone Encounter (Signed)
? ?  Pre-operative Risk Assessment  ?  ?Patient Name: Antonio [name]  ?DOB: 02/02/1946 ?MRN: [id number]  ? ?  ? ?Request for Surgical Clearance   ? ?Procedure:  Robotic assisted video bronchoscopy ? ?Date of Surgery:  Clearance 05/18/21                              ?   ?Surgeon:  Dr. Patsey Berthold ?Surgeon's Group or Practice Name:  [facility] ?Phone number:  [phone] ?Fax number:  [phone] ?  ?Type of Clearance Requested:   ?- Medical  ?  ?Type of Anesthesia:  Not Indicated ?  ?Additional requests/questions:   ? ?Signed, ?Pilar A Ham   ?05/12/2021, 9:01 AM   ?

## 2021-05-12 NOTE — Telephone Encounter (Signed)
Noted thank you, anesthesia type will be general. ?

## 2021-05-13 ENCOUNTER — Encounter: Payer: Self-pay | Admitting: Cardiology

## 2021-05-13 ENCOUNTER — Ambulatory Visit (INDEPENDENT_AMBULATORY_CARE_PROVIDER_SITE_OTHER): Payer: Medicare Other | Admitting: Cardiology

## 2021-05-13 VITALS — BP 162/62 | HR 52 | Ht 66.0 in | Wt 165.0 lb

## 2021-05-13 DIAGNOSIS — I10 Essential (primary) hypertension: Secondary | ICD-10-CM

## 2021-05-13 DIAGNOSIS — I2510 Atherosclerotic heart disease of native coronary artery without angina pectoris: Secondary | ICD-10-CM | POA: Diagnosis not present

## 2021-05-13 DIAGNOSIS — Z01810 Encounter for preprocedural cardiovascular examination: Secondary | ICD-10-CM

## 2021-05-13 DIAGNOSIS — I441 Atrioventricular block, second degree: Secondary | ICD-10-CM

## 2021-05-13 DIAGNOSIS — R0609 Other forms of dyspnea: Secondary | ICD-10-CM

## 2021-05-13 MED ORDER — AMLODIPINE BESYLATE 10 MG PO TABS: 10.0000 mg | ORAL_TABLET | Freq: Every day | ORAL | 5 refills | Status: DC

## 2021-05-13 MED ORDER — AMLODIPINE BESYLATE 10 MG PO TABS
10.0000 mg | ORAL_TABLET | Freq: Every day | ORAL | 5 refills | Status: DC
Start: 2021-05-13 — End: 2021-05-13

## 2021-05-13 NOTE — Patient Instructions (Signed)
Medication Instructions:  ?Your physician has recommended you make the following change in your medication:  ? ?-STOP Metoprolol ? ?-INCREASE Amlodipine to 10 mg daily. An Rx has been sent to your pharmacy. ? ? ?*If you need a refill on your cardiac medications before your next appointment, please call your pharmacy* ? ? ?Lab Work: ?None ordered ?If you have labs (blood work) drawn today and your tests are completely normal, you will receive your results only by: ?MyChart Message (if you have MyChart) OR ?A paper copy in the mail ?If you have any lab test that is abnormal or we need to change your treatment, we will call you to review the results. ? ? ?Testing/Procedures: ?Your physician has requested that you have an echocardiogram. Echocardiography is a painless test that uses sound waves to create images of your heart. It provides your doctor with information about the size and shape of your heart and how well your heart?s chambers and valves are working. This procedure takes approximately one hour. There are no restrictions for this procedure. ? ?Your physician has requested that you have a lexiscan myoview. For further information please visit [email or web address]. Please follow instruction sheet, as given.  ? ? ?Follow-Up: ?At [facility], you and your health needs are our priority.  As part of our continuing mission to provide you with exceptional heart care, we have created designated Provider Care Teams.  These Care Teams include your primary Cardiologist (physician) and Advanced Practice Providers (APPs -  Physician Assistants and Nurse Practitioners) who all work together to provide you with the care you need, when you need it. ? ?We recommend signing up for the patient portal called "MyChart".  Sign up information is provided on this After Visit Summary.  MyChart is used to connect with patients for Virtual Visits (Telemedicine).  Patients are able to view lab/test results, encounter notes, upcoming  appointments, etc.  Non-urgent messages can be sent to your provider as well.   ?To learn more about what you can do with MyChart, go to [email or web address].   ? ?Your next appointment:   ?After testing ? ?The format for your next appointment:   ?In Person ? ?Provider:   ?Kate Sable, MD{ ? ? ? ? ?Other Instructions ?[facility] MYOVIEW ? ?Your caregiver has ordered a Stress Test with nuclear imaging. The purpose of this test is to evaluate the blood supply to your heart muscle. This procedure is referred to as a "Non-Invasive Stress Test." This is because other than having an IV started in your vein, nothing is inserted or "invades" your body. Cardiac stress tests are done to find areas of poor blood flow to the heart by determining the extent of coronary artery disease (CAD). Some patients exercise on a treadmill, which naturally increases the blood flow to your heart, while others who are  unable to walk on a treadmill due to physical limitations have a pharmacologic/chemical stress agent called Lexiscan . This medicine will mimic walking on a treadmill by temporarily increasing your coronary blood flow.  ? ?Please note: these test may take anywhere between 2-4 hours to complete ? ?PLEASE REPORT TO [facility] MEDICAL MALL ENTRANCE  ?THE VOLUNTEERS AT THE FIRST DESK WILL DIRECT YOU WHERE TO GO ? ?Date of Procedure:_____________________________________ ? ?Arrival Time for Procedure:______________________________ ? ?Instructions regarding medication:  ? ?__X__ : Hold diabetes medication morning of procedure ? ? ? ?PLEASE NOTIFY THE OFFICE AT LEAST 24 HOURS IN ADVANCE IF YOU ARE UNABLE TO KEEP YOUR APPOINTMENT.  [phone] ?  AND  ?PLEASE NOTIFY NUCLEAR MEDICINE AT [facility] AT LEAST 24 HOURS IN ADVANCE IF YOU ARE UNABLE TO KEEP YOUR APPOINTMENT. [phone] ? ?How to prepare for your Myoview test: ? ?Do not eat or drink after midnight ?No caffeine for 24 hours prior to test ?No smoking 24 hours prior to test. ?Your  medication may be taken with water.  If your doctor stopped a medication because of this test, do not take that medication. ?Ladies, please do not wear dresses.  Skirts or pants are appropriate. Please wear a short sleeve shirt. ?No cologne or lotion. ?Wear comfortable walking shoes. No heels! ? ? ? ? ? ? ?

## 2021-05-13 NOTE — Progress Notes (Signed)
?Cardiology Office Note:   ? ?Date:  05/13/2021  ? ?ID:  Antonio [name], DOB 1945/02/14, MRN [id number] ? ?PCP:  Pleas Koch, NP ?  ?[facility] Providers ?Cardiologist:  None    ? ?Referring MD: Pleas Koch, NP  ? ?Chief Complaint  ?Patient presents with  ? New Patient (Initial Visit)  ?  Referred by Dr. Patsey Berthold for cardiac clearance, abnormal EKG. Meds reviewed verbally with patient.   ? ? ?History of Present Illness:   ? ?Antonio [name] is a 76 y.o. male with a hx of hypertension, hyperlipidemia, diabetes, COPD, current smoker x50+ years, PAD s/p bilateral SFA/popliteal stents who presents for preop evaluation. ? ?Patient follows up with pulmonary medicine due to current smoking with history of COPD. ? ?Chest CT for lung cancer screening obtained 04/12/2021 showed pulmonary nodule measuring 12 mm.  Three-vessel coronary artery calcification and emphysema also noted. ? ?Bronchoscopy and possible biopsy is being planned next week.  Patient denies chest pain but endorses shortness of breath with overexertion.  He still smokes but is working on quitting. ? ?Past Medical History:  ?Diagnosis Date  ? Anemia   ? Arthritis   ? Asthma   ? age 29  ? BPH with urinary obstruction   ? stable on flomax ([name]  ? Coronary atherosclerosis of unspecified type of vessel, native or graft   ? 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 Emphysema of lung ([place])   ? Esophageal stricture   ? Gastritis   ? GERD (gastroesophageal reflux disease)   ? Hiatal hernia   ? Hx of colonic polyps   ? Hyperlipidemia   ? Hypertension   ? Internal hemorrhoids without mention of complication   ? Lumbago   ? Pain in both lower legs   ? Pneumonia   ? PSA elevation   ? now averaging 2's  ? Tobacco abuse   ? URI (upper respiratory infection)   ? Urinary retention   ? ? ?Past Surgical History:  ?Procedure Laterality Date  ? 2D Echo  06/03  ? Abdominal ultrasound  06/03  ? Negative  ? BACK SURGERY    ? COLONOSCOPY    ? CT of head  and sinuses  06/03  ?  Negative  ? CT of the chest, abdomen, pelvis  06/03  ? Chest negative; Abdomen, left adrenal lesion; Pelvis, enlarged prostate  ? ESOPHAGOGASTRODUODENOSCOPY  02/13/07  ? gastritis and duodenitis without bleed  ? ESOPHAGOGASTRODUODENOSCOPY N/A 06/26/2014  ? Procedure: ESOPHAGOGASTRODUODENOSCOPY (EGD);  Surgeon: Milus Banister, MD;  Location: [place];  Service: Endoscopy;  Laterality: N/A;  ? ESOPHAGOGASTRODUODENOSCOPY (EGD) WITH PROPOFOL N/A 04/08/2020  ? Procedure: ESOPHAGOGASTRODUODENOSCOPY (EGD) WITH PROPOFOL;  Surgeon: Ladene Artist, MD;  [facility];  Service: Endoscopy;  Laterality: N/A;  ? HOT HEMOSTASIS N/A 04/08/2020  ? Procedure: HOT HEMOSTASIS (ARGON PLASMA COAGULATION/BICAP);  Surgeon: Ladene Artist, MD;  Location: [facility];  Service: Endoscopy;  Laterality: N/A;  ? KNEE ARTHROSCOPY  10/00  ? Right  ? LOWER EXTREMITY ANGIOGRAPHY Right 12/27/2017  ? Procedure: LOWER EXTREMITY ANGIOGRAPHY;  Surgeon: Algernon Huxley, MD;  Location: [place] [facility];  Service: Cardiovascular;  Laterality: Right;  ? LOWER EXTREMITY ANGIOGRAPHY Left 01/10/2018  ? Procedure: LOWER EXTREMITY ANGIOGRAPHY;  Surgeon: Algernon Huxley, MD;  Location: [place] [facility];  Service: Cardiovascular;  Laterality: Left;  ? LOWER EXTREMITY ANGIOGRAPHY Left 05/29/2018  ? Procedure: LOWER EXTREMITY ANGIOGRAPHY;  Surgeon: Algernon Huxley, MD;  Location: [place] [facility];  Service: Cardiovascular;  Laterality: Left;  ? LOWER EXTREMITY ANGIOGRAPHY Left 05/30/2018  ? Procedure: Lower Extremity Angiography;  Surgeon: Algernon Huxley, MD;  Location: [place] [facility];  Service: Cardiovascular;  Laterality: Left;  ? LP  06/03  ? Microwave thermotherapy prostate  08/28/07  ? Rogers Blocker  ? Persantine cardiolite  03/01  ? EF 68%  ? POLYPECTOMY  11/95  ? Stress myoview  07/06  ? EF 67%  ? UPPER GASTROINTESTINAL ENDOSCOPY    ? ? ?Current Medications: ?Current Meds  ?Medication Sig  ? acetaminophen (TYLENOL) 500 MG tablet Take 1,000 mg by mouth  every 6 (six) hours as needed (hip pain.). Rapid release  ? aspirin 81 MG EC tablet Take 81 mg by mouth in the morning.  ? atorvastatin (LIPITOR) 20 MG tablet TAKE 1 TABLET BY MOUTH DAILY IN THE EVENING FOR CHOLESTEROL. Office visit required for further refills.  ? clopidogrel (PLAVIX) 75 MG tablet Take 1 tablet (75 mg total) by mouth daily.  ? ferrous gluconate (FERGON) 324 MG tablet TAKE 1 TABLET BY MOUTH EVERY DAY WITH BREAKFAST  ? FIBER PO Take 1 capsule by mouth in the morning.  ? finasteride (PROSCAR) 5 MG tablet TAKE 1 TABLET BY MOUTH EVERY DAY  ? Fluticasone-Umeclidin-Vilant (TRELEGY ELLIPTA) 100-62.5-25 MCG/ACT AEPB Inhale 100 mcg into the lungs daily.  ? glipiZIDE (GLUCOTROL) 10 MG tablet Take 10 mg by mouth daily before supper.   ? GLUCOSAMINE HCL PO Take 1 tablet by mouth daily.  ? insulin detemir (LEVEMIR) 100 UNIT/ML injection Inject 20 units at bedtime.  ? lisinopril-hydrochlorothiazide (ZESTORETIC) 20-25 MG tablet Take 1 tablet by mouth daily. For blood pressure.  ? metFORMIN (GLUCOPHAGE) 500 MG tablet TAKE 2 TABLETS BY MOUTH TWICE A DAY WITH MEAL FOR DIABETES  ? pantoprazole (PROTONIX) 40 MG tablet Take 1 tablet (40 mg total) by mouth daily. For heartburn. Office visit required for further refills.  ? sildenafil (VIAGRA) 100 MG tablet Take 1 tablet (100 mg total) by mouth daily as needed for erectile dysfunction.  ? tamsulosin (FLOMAX) 0.4 MG CAPS capsule TAKE 1 CAPSULE BY MOUTH 2 TIMES DAILY.  ? vitamin B-12 (CYANOCOBALAMIN) 1000 MCG tablet Take 1,000 mcg by mouth in the morning.  ? [DISCONTINUED] amLODipine (NORVASC) 5 MG tablet Take 5 mg by mouth daily.  ? [DISCONTINUED] metoprolol tartrate (LOPRESSOR) 50 MG tablet TAKE 1/2 TABLET BY MOUTH TWICE A DAY FOR BLOOD PRESSURE. OFFICE VISIT REQUIRED.  ?  ? ?Allergies:   Patient has no known allergies.  ? ?Social History  ? ?Socioeconomic History  ? Marital status: Widowed  ?  Spouse name: [name]  ? Number of children: 3  ? Years of education: Not on  file  ? Highest education level: Not on file  ?Occupational History  ? Occupation: Retired Games developer  ?Tobacco Use  ? Smoking status: Every Day  ?  Packs/day: 2.00  ?  Years: 57.00  ?  Pack years: 114.00  ?  Types: Cigarettes  ? Smokeless tobacco: Former  ?  Types: Chew  ? Tobacco comments:  ?  0.5 PPD since 04/20/2021  ?Vaping Use  ? Vaping Use: Never used  ?Substance and Sexual Activity  ? Alcohol use: Not Currently  ?  Alcohol/week: 0.0 standard drinks  ?  Comment: rare  ? Drug use: No  ? Sexual activity: Yes  ?Other Topics Concern  ? Not on file  ?Social History Narrative  ? Married 9/09 wife with moderate memory  problems.  ? 3 adult children, 2 grandchildren  ? Would desire CPR  ? ?Social Determinants of Health  ? ?Financial Resource Strain: Low Risk   ? Difficulty of Paying Living Expenses: Not very hard  ?Food Insecurity: No Food Insecurity  ? Worried About Charity fundraiser in the Last Year: Never true  ? Ran Out of Food in the Last Year: Never true  ?Transportation Needs: No Transportation Needs  ? Lack of Transportation (Medical): No  ? Lack of Transportation (Non-Medical): No  ?Physical Activity: Inactive  ? Days of Exercise per Week: 0 days  ? Minutes of Exercise per Session: 0 min  ?Stress: No Stress Concern Present  ? Feeling of Stress : Not at all  ?Social Connections: Not on file  ?  ? ?Family History: ?The patient's family history includes Alcohol abuse in his paternal uncle, paternal uncle, and paternal uncle; Diabetes in his father; Rheum arthritis in his mother. There is no history of Heart disease, Stroke, Cancer, Drug abuse, Depression, Colon cancer, Rectal cancer, Stomach cancer, Kidney cancer, Bladder Cancer, or Prostate cancer. ? ?ROS:   ?Please see the history of present illness.    ? All other systems reviewed and are negative. ? ?EKGs/Labs/Other Studies Reviewed:   ? ?The following studies were reviewed today: ? ? ?EKG:  EKG is  ordered today.  The ekg ordered today demonstrates Mobitz 1  AV block, heart rate 52 ? ?Recent Labs: ?05/11/2021: BUN 24; Creatinine, Ser 0.72; Hemoglobin 12.8; Platelets 280; Potassium 4.1; Sodium 140  ?Recent Lipid Panel ?   ?Component Value Date/Time  ? CHOL 1

## 2021-05-16 ENCOUNTER — Other Ambulatory Visit: Payer: Medicare Other

## 2021-05-16 ENCOUNTER — Other Ambulatory Visit
Admission: RE | Admit: 2021-05-16 | Discharge: 2021-05-16 | Disposition: A | Discharge status: Home / Self Care | Payer: Medicare Other | Source: Ambulatory Visit | Attending: Pulmonary Disease | Admitting: Pulmonary Disease

## 2021-05-16 DIAGNOSIS — Z01812 Encounter for preprocedural laboratory examination: Secondary | ICD-10-CM | POA: Insufficient documentation

## 2021-05-16 DIAGNOSIS — Z20822 Contact with and (suspected) exposure to covid-19: Secondary | ICD-10-CM | POA: Insufficient documentation

## 2021-05-16 DIAGNOSIS — Z1152 Encounter for screening for COVID-19: Secondary | ICD-10-CM

## 2021-05-16 LAB — URINALYSIS, COMPLETE
Bilirubin, UA: NEGATIVE
Glucose, UA: NEGATIVE
Ketones, UA: NEGATIVE
Nitrite, UA: NEGATIVE
RBC, UA: NEGATIVE
Specific Gravity, UA: 1.02 (ref 1.005–1.030)
Urobilinogen, Ur: 0.2 mg/dL (ref 0.2–1.0)
pH, UA: 5.5 (ref 5.0–7.5)

## 2021-05-16 LAB — MICROSCOPIC EXAMINATION

## 2021-05-16 NOTE — Pre-Procedure Instructions (Addendum)
Cardiac Clearance in Epic on 05-13-21 from Dr Garen Lah. Low Risk  ?

## 2021-05-17 ENCOUNTER — Ambulatory Visit (INDEPENDENT_AMBULATORY_CARE_PROVIDER_SITE_OTHER): Payer: Medicare Other

## 2021-05-17 ENCOUNTER — Ambulatory Visit (INDEPENDENT_AMBULATORY_CARE_PROVIDER_SITE_OTHER): Payer: Medicare Other | Admitting: Vascular Surgery

## 2021-05-17 DIAGNOSIS — I739 Peripheral vascular disease, unspecified: Secondary | ICD-10-CM

## 2021-05-17 LAB — SARS CORONAVIRUS 2 (TAT 6-24 HRS): SARS Coronavirus 2: NEGATIVE

## 2021-05-17 MED ORDER — ORAL CARE MOUTH RINSE: 15.0000 mL | Freq: Once | OROMUCOSAL | Status: AC

## 2021-05-17 MED ORDER — SODIUM CHLORIDE 0.9 % IV SOLN: INTRAVENOUS | Status: DC

## 2021-05-17 MED ORDER — IPRATROPIUM-ALBUTEROL 0.5-2.5 (3) MG/3ML IN SOLN: 3.0000 mL | Freq: Once | RESPIRATORY_TRACT | Status: AC

## 2021-05-17 MED ORDER — CHLORHEXIDINE GLUCONATE 0.12 % MT SOLN: 15.0000 mL | Freq: Once | OROMUCOSAL | Status: AC

## 2021-05-17 MED ORDER — SODIUM CHLORIDE 0.9 % IV SOLN: Freq: Once | INTRAVENOUS | Status: AC

## 2021-05-18 ENCOUNTER — Encounter: Payer: Self-pay | Admitting: Pulmonary Disease

## 2021-05-18 ENCOUNTER — Ambulatory Visit: Payer: Medicare Other | Admitting: Anesthesiology

## 2021-05-18 ENCOUNTER — Other Ambulatory Visit: Payer: Self-pay

## 2021-05-18 ENCOUNTER — Ambulatory Visit
Admission: RE | Admit: 2021-05-18 | Discharge: 2021-05-18 | Disposition: A | Discharge status: Home / Self Care | Payer: Medicare Other | Attending: Pulmonary Disease | Admitting: Pulmonary Disease

## 2021-05-18 ENCOUNTER — Encounter
Admission: RE | Disposition: A | Discharge status: Home / Self Care | Payer: Self-pay | Source: Home / Self Care | Attending: Pulmonary Disease

## 2021-05-18 ENCOUNTER — Ambulatory Visit: Payer: Medicare Other

## 2021-05-18 DIAGNOSIS — E1159 Type 2 diabetes mellitus with other circulatory complications: Secondary | ICD-10-CM | POA: Diagnosis not present

## 2021-05-18 DIAGNOSIS — F17210 Nicotine dependence, cigarettes, uncomplicated: Secondary | ICD-10-CM | POA: Diagnosis not present

## 2021-05-18 DIAGNOSIS — C3412 Malignant neoplasm of upper lobe, left bronchus or lung: Secondary | ICD-10-CM | POA: Diagnosis not present

## 2021-05-18 DIAGNOSIS — Z95820 Peripheral vascular angioplasty status with implants and grafts: Secondary | ICD-10-CM | POA: Diagnosis not present

## 2021-05-18 DIAGNOSIS — R911 Solitary pulmonary nodule: Secondary | ICD-10-CM

## 2021-05-18 DIAGNOSIS — I10 Essential (primary) hypertension: Secondary | ICD-10-CM | POA: Insufficient documentation

## 2021-05-18 DIAGNOSIS — K219 Gastro-esophageal reflux disease without esophagitis: Secondary | ICD-10-CM | POA: Diagnosis not present

## 2021-05-18 DIAGNOSIS — E1151 Type 2 diabetes mellitus with diabetic peripheral angiopathy without gangrene: Secondary | ICD-10-CM | POA: Insufficient documentation

## 2021-05-18 DIAGNOSIS — K449 Diaphragmatic hernia without obstruction or gangrene: Secondary | ICD-10-CM | POA: Diagnosis not present

## 2021-05-18 DIAGNOSIS — Z794 Long term (current) use of insulin: Secondary | ICD-10-CM | POA: Diagnosis not present

## 2021-05-18 DIAGNOSIS — I2510 Atherosclerotic heart disease of native coronary artery without angina pectoris: Secondary | ICD-10-CM | POA: Diagnosis not present

## 2021-05-18 DIAGNOSIS — J449 Chronic obstructive pulmonary disease, unspecified: Secondary | ICD-10-CM | POA: Diagnosis not present

## 2021-05-18 DIAGNOSIS — I499 Cardiac arrhythmia, unspecified: Secondary | ICD-10-CM | POA: Diagnosis not present

## 2021-05-18 DIAGNOSIS — E1129 Type 2 diabetes mellitus with other diabetic kidney complication: Secondary | ICD-10-CM | POA: Diagnosis not present

## 2021-05-18 DIAGNOSIS — R918 Other nonspecific abnormal finding of lung field: Secondary | ICD-10-CM | POA: Diagnosis not present

## 2021-05-18 DIAGNOSIS — E785 Hyperlipidemia, unspecified: Secondary | ICD-10-CM | POA: Diagnosis not present

## 2021-05-18 DIAGNOSIS — Z9889 Other specified postprocedural states: Secondary | ICD-10-CM

## 2021-05-18 DIAGNOSIS — R809 Proteinuria, unspecified: Secondary | ICD-10-CM | POA: Diagnosis not present

## 2021-05-18 LAB — GLUCOSE, CAPILLARY
Glucose-Capillary: 212 mg/dL — ABNORMAL HIGH (ref 70–99)
Glucose-Capillary: 197 mg/dL — ABNORMAL HIGH (ref 70–99)

## 2021-05-18 SURGERY — BRONCHOSCOPY, WITH BIOPSY USING ELECTROMAGNETIC NAVIGATION
Anesthesia: General | Laterality: Left

## 2021-05-18 MED ORDER — ONDANSETRON HCL 4 MG/2ML IJ SOLN
INTRAMUSCULAR | Status: DC | PRN
  Administered 2021-05-18 (×2): 4 mg via INTRAVENOUS

## 2021-05-18 MED ORDER — IPRATROPIUM-ALBUTEROL 0.5-2.5 (3) MG/3ML IN SOLN
3.0000 mL | Freq: Once | RESPIRATORY_TRACT | Status: AC
  Administered 2021-05-18: 3 mL via RESPIRATORY_TRACT

## 2021-05-18 MED ORDER — IPRATROPIUM-ALBUTEROL 0.5-2.5 (3) MG/3ML IN SOLN
RESPIRATORY_TRACT | Status: AC
  Administered 2021-05-18: 3 mL via RESPIRATORY_TRACT
  Filled 2021-05-18: qty 3

## 2021-05-18 MED ORDER — PHENYLEPHRINE HCL (PRESSORS) 10 MG/ML IV SOLN
INTRAVENOUS | Status: AC
  Filled 2021-05-18: qty 1

## 2021-05-18 MED ORDER — SUCCINYLCHOLINE CHLORIDE 200 MG/10ML IV SOSY
PREFILLED_SYRINGE | INTRAVENOUS | Status: DC | PRN
  Administered 2021-05-18: 100 mg via INTRAVENOUS

## 2021-05-18 MED ORDER — ROCURONIUM BROMIDE 100 MG/10ML IV SOLN
INTRAVENOUS | Status: DC | PRN
Start: 2021-05-18 — End: 2021-05-18
  Administered 2021-05-18: 10 mg via INTRAVENOUS
  Administered 2021-05-18: 20 mg via INTRAVENOUS
  Administered 2021-05-18: 40 mg via INTRAVENOUS

## 2021-05-18 MED ORDER — FENTANYL CITRATE (PF) 100 MCG/2ML IJ SOLN
INTRAMUSCULAR | Status: DC | PRN
  Administered 2021-05-18: 50 ug via INTRAVENOUS

## 2021-05-18 MED ORDER — LIDOCAINE HCL (CARDIAC) PF 100 MG/5ML IV SOSY
PREFILLED_SYRINGE | INTRAVENOUS | Status: DC | PRN
  Administered 2021-05-18: 100 mg via INTRAVENOUS

## 2021-05-18 MED ORDER — DEXAMETHASONE SODIUM PHOSPHATE 10 MG/ML IJ SOLN
INTRAMUSCULAR | Status: DC | PRN
  Administered 2021-05-18: 10 mg via INTRAVENOUS

## 2021-05-18 MED ORDER — GLYCOPYRROLATE 0.2 MG/ML IJ SOLN
INTRAMUSCULAR | Status: DC | PRN
  Administered 2021-05-18: .2 mg via INTRAVENOUS

## 2021-05-18 MED ORDER — PHENYLEPHRINE 40 MCG/ML (10ML) SYRINGE FOR IV PUSH (FOR BLOOD PRESSURE SUPPORT): PREFILLED_SYRINGE | INTRAVENOUS | Status: DC | PRN

## 2021-05-18 MED ORDER — IPRATROPIUM-ALBUTEROL 0.5-2.5 (3) MG/3ML IN SOLN
RESPIRATORY_TRACT | Status: AC
  Filled 2021-05-18: qty 3

## 2021-05-18 MED ORDER — PHENYLEPHRINE HCL-NACL 20-0.9 MG/250ML-% IV SOLN
INTRAVENOUS | Status: DC | PRN
  Administered 2021-05-18: 25 ug/min via INTRAVENOUS

## 2021-05-18 MED ORDER — EPHEDRINE SULFATE (PRESSORS) 50 MG/ML IJ SOLN
INTRAMUSCULAR | Status: DC | PRN
  Administered 2021-05-18: 10 mg via INTRAVENOUS
  Administered 2021-05-18 (×2): 5 mg via INTRAVENOUS

## 2021-05-18 MED ORDER — PROPOFOL 10 MG/ML IV BOLUS
INTRAVENOUS | Status: DC | PRN
  Administered 2021-05-18: 150 mg via INTRAVENOUS

## 2021-05-18 MED ORDER — CHLORHEXIDINE GLUCONATE 0.12 % MT SOLN
OROMUCOSAL | Status: AC
  Administered 2021-05-18: 15 mL via OROMUCOSAL
  Filled 2021-05-18: qty 15

## 2021-05-18 MED ORDER — FENTANYL CITRATE (PF) 100 MCG/2ML IJ SOLN
INTRAMUSCULAR | Status: AC
Start: 2021-05-18 — End: ?
  Filled 2021-05-18: qty 2

## 2021-05-18 MED ORDER — FENTANYL CITRATE (PF) 100 MCG/2ML IJ SOLN: 25.0000 ug | INTRAMUSCULAR | Status: DC | PRN

## 2021-05-18 NOTE — Anesthesia Procedure Notes (Addendum)
Procedure Name: Intubation ?Date/Time: 05/18/2021 12:48 PM ?Performed by: Kelton Pillar, CRNA ?Pre-anesthesia Checklist: Patient identified, Emergency Drugs available, Suction available and Patient being monitored ?Patient Re-evaluated:Patient Re-evaluated prior to induction ?Oxygen Delivery Method: Circle system utilized ?Preoxygenation: Pre-oxygenation with 100% oxygen ?Induction Type: IV induction ?Ventilation: Mask ventilation without difficulty ?Laryngoscope Size: McGraph and 3 ?Grade View: Grade I ?Tube type: Oral ?Tube size: 8.5 mm ?Number of attempts: 1 ?Airway Equipment and Method: Stylet and Oral airway ?Placement Confirmation: ETT inserted through vocal cords under direct vision, positive ETCO2, breath sounds checked- equal and bilateral and CO2 detector ?Secured at: 21 cm ?Tube secured with: Tape ?Dental Injury: Teeth and Oropharynx as per pre-operative assessment  ? ? ? ? ?

## 2021-05-18 NOTE — Op Note (Signed)
PROCEDURES: ?Robotic assisted bronchoscopy ?Augmented fluoroscopy ?Transbronchial needle aspirate ? ? ?Indication: Left upper lobe nodule (perihilar) 12 mm, incidental finding low-dose chest CT for lung cancer screening.  PET/CT avid.  Current smoker.  Rule out carcinoma. ? ?Preoperative Diagnosis: Left perihilar nodule, rule out cancer ?Post Procedure Diagnosis: Same as above ?Consent: Verbal/Written: obtained ? ?Benefits, limitations and potential complications of the procedure were discussed with the patient/family.  Complications from bronchoscopy are rare and most often minor, but if they occur they may include breathing difficulty, vocal cord spasm, hoarseness, slight fever, vomiting, dizziness, bronchospasm, infection, low blood oxygen, bleeding from biopsy site, or an allergic reaction to medications.  It is uncommon for patients to experience other more serious complications for example: Collapsed lung requiring chest tube placement, respiratory failure, heart attack and/or cardiac arrhythmia.  Patient understood the potential complications and agreed to proceed. ? ?Surgeon: Renold Don, MD ?Assistant/Scrub: Sullivan Lone, RRT ?Circulator: Annia Belt, RRT ?Anesthesiologist/CRNA: Martha Clan, MD/Tawana Lowella Curb, CRNA ?Cytotechnology: Maryan Puls, team lead Veryl Speak ?Fluoroscopy technician: Hulda Humphrey, RT ?Representatives: Shon Millet, Katrine Coho ([employer]) ? ?Type of Anesthesia: General endotracheal ? ?Procedures Performed:   ?Robotic bronchoscopy: Procedure consists of robotic navigation comprised of electromagnetics, optical pattern recognition and robotic kinematic data - to triangulate bronchoscope location during the procedure and provide accurate positional data to biopsy a lesion. ?Augmented fluoroscopy with Body Vision. ? ?Description of Procedure:  ?Robotic bronchoscopy: ?The patient was brought to Procedure Room 2 (Bronchoscopy Suite) in the OR area where appropriate  timeout was taken with the staff after the patient was inducted under general anesthesia.  The patient was inducted under general anesthesia and intubated by the anesthesia team.  Patient was intubated with a 8.5 ET tube without difficulty.  Tube was secured at 4 cm above the carina.  A Portex adapter was placed on the ET tube flange.  Once the patient was under adequate general anesthesia the Olympus therapeutic video bronchoscope was advanced and an anatomic airway tour and surveillance bronchoscopy was performed.The distal trachea appeared unremarkable. The main carina was sharp.  No secretions were seen in either right or left mainstem bronchi. The RUL, RLL, RML appeared to be free of endobronchial masses, lesions, or purulent secretions. Likewise, the LLL/LUL appeared to be free of endobronchial masses, lesions, or purulent secretions.  There were some scant benign appearing secretions on the right upper lobe and right middle lobe that were suctioned until cleared.  Once the survey bronchoscopy was completed, registration for the augmented fluoroscopy (Body Vision) was then performed with the fluoroscopic C arm.  Once this was completed, the robotic bronchoscope ET tube adapter was placed and ETT was cut to proper length and secured on the mid plane.  The [facility] robotic scope was then advanced through the ETT and registration was performed successfully.  There was good correlation between the robotic mapping and bronchoscopic mapping. With the assistance of fused navigation, the bronchoscope was advanced to the LUL nodule/mass. The tip of the working channel sat within 12 mm of the nodule  Positioning was confirmed with augmented fluoroscopy.  Cellvizio could not be utilized as the lesion was noted to be exo bronchial.  Augmented fluoroscopy via Body Vision was utilized to optimize the position most favorable for transbronchial needle biopsies, then the robotic bronchoscope was anchored to maintain position.   A total of 8 transbronchial needle biopsies were obtained at this point.  ROSE showed only inflammatory cells and blood.  Lesion was not amenable to biopsy  by any other means such as forceps, brushings or BAL due to the exobronchial nature.  Once this was completed, the robotic scope was retrieved and the Olympus video bronchoscope was then utilized.  Examination of the airway showed that there was some heme in the airway which was lavaged till clear.  Once this was lavaged excellent hemostasis could be noted. The patient received bronchial lavage with 10 mL of 1% lidocaine via the ET tube. The patient tolerated the procedure well. No significant bleeding was observed at the conclusion of the procedure.  At this point, the patient was allowed to emerge from general anesthesia, and was extubated in the procedure room without incident.  The patient  was taken to the PACU in satisfactory condition.  Auscultation of the lungs showed no rhonchi throughout with symmetrical lung sounds..  Patient tolerated the procedure well with no untoward effects of anesthesia noted. ?  ?Specimens Obtained: ? ?Transbronchial Needle Aspirates: X 8 ? ?Transbronchial Brush: N/A ? ?Targeted BAL: N/A ? ?Fluoroscopy: Augmented fluoroscopy (Body Vision) was utilized during the course of this procedure to assure that biopsies were taken in a safe manner under fluoroscopic guidance with spot films required.  Total fluoroscopy time: 2 minutes 14 seconds, cumulative dose was 37.8 mGy. ? ?Intraoperative image: ? ? ? ?Complications:None, no pneumothorax on post film: ? ? ?Estimated Blood Loss: About 8 mL ?  ? ?Assessment and Plan/Additional Comments: ?LEFT perihilar/upper lobe nodule, status post robotic assisted TBNA ?Await pathology reports ? ? ? ? ?C. Derrill Kay, MD ?Advanced Bronchoscopy ?P[facility] ? ? ? ?*This note was dictated using voice recognition software/Dragon.  Despite best efforts to proofread, errors can  occur which can change the meaning.  Any change was purely unintentional.  ?

## 2021-05-18 NOTE — Anesthesia Preprocedure Evaluation (Signed)
Anesthesia Evaluation  ?Patient identified by MRN, date of birth, ID band ?Patient awake ? ? ? ?Reviewed: ?Allergy & Precautions, H&P , NPO status , Patient's Chart, lab work & pertinent test results, reviewed documented beta blocker date and time  ? ?Airway ?Mallampati: III ? ?TM Distance: >3 FB ?Neck ROM: full ? ? ? Dental ? ?(+) Dental Advidsory Given, Poor Dentition, Missing, Edentulous Upper, Partial Lower ?  ?Pulmonary ?neg shortness of breath, asthma , COPD,  COPD inhaler, neg recent URI, Current Smoker,  ?  ?Pulmonary exam normal ?breath sounds clear to auscultation ? ? ? ? ? ? Cardiovascular ?Exercise Tolerance: Good ?hypertension, (-) angina+ CAD and + Peripheral Vascular Disease  ?(-) Past MI and (-) Cardiac Stents Normal cardiovascular exam+ dysrhythmias (-) Valvular Problems/Murmurs ?Rhythm:regular Rate:Normal ? ? ?  ?Neuro/Psych ?negative neurological ROS ? negative psych ROS  ? GI/Hepatic ?Neg liver ROS, hiatal hernia, GERD  ,  ?Endo/Other  ?diabetes ? Renal/GU ?negative Renal ROS  ?negative genitourinary ?  ?Musculoskeletal ? ? Abdominal ?  ?Peds ? Hematology ?negative hematology ROS ?(+)   ?Anesthesia Other Findings ?Past Medical History: ?No date: Anemia ?No date: Arthritis ?No date: Asthma ?    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 ? Reproductive/Obstetrics ?negative OB ROS ? ?  ? ? ? ? ? ? ? ? ? ? ? ? ? ?  ?  ? ? ? ? ? ? ? ? ?Anesthesia Physical ?Anesthesia Plan ? ?ASA: 3 ? ?Anesthesia Plan: General  ? ?Post-op Pain Management:   ? ?Induction: Intravenous ? ?PONV Risk Score and Plan: 1 and Ondansetron, Dexamethasone and Treatment may vary due to age or medical condition ? ?Airway Management Planned: Oral ETT ? ?Additional Equipment:  ? ?Intra-op Plan:  ? ?Post-operative Plan: Extubation in OR ? ?Informed Consent: I have reviewed the patients History and Physical, chart, labs and discussed the procedure including the risks, benefits and alternatives for the proposed anesthesia with the patient or authorized representative who has indicated his/her understanding and acceptance.  ? ? ? ?Dental Advisory Given ? ?Plan Discussed with: Anesthesiologist, CRNA and Surgeon ? ?Anesthesia Plan Comments:   ? ? ? ? ? ? ?Anesthesia Quick Evaluation ? ?

## 2021-05-18 NOTE — Interval H&P Note (Signed)
History and Physical Interval Note: ? ?05/18/2021 ?11:28 AM ? ?Antonio [name]  has presented today for surgery, with the diagnosis of LEFT LUNG NODULE.  The various methods of treatment have been discussed with the patient and family. After consideration of risks, benefits and other options for treatment, the patient has consented to  Procedure(s): ?ROBOTIC ASSISTED NAVIGATIONAL BRONCHOSCOPY, WITH CELLVIZIO (Left) as a surgical intervention.  The patient's history has been reviewed, patient examined, no change in status, stable for surgery.  I have reviewed the patient's chart and labs.  Questions were answered to the patient's satisfaction.   ? ? ?Antonio [name] ? ? ?

## 2021-05-18 NOTE — Transfer of Care (Signed)
Immediate Anesthesia Transfer of Care Note ? ?Patient: Antonio [name] ? ?Procedure(s) Performed: ROBOTIC ASSISTED NAVIGATIONAL BRONCHOSCOPY, WITH CELLVIZIO (Left) ? ?Patient Location: PACU ? ?Anesthesia Type:General ? ?Level of Consciousness: awake, drowsy and patient cooperative ? ?Airway & Oxygen Therapy: Patient Spontanous Breathing and Patient connected to face mask oxygen ? ?Post-op Assessment: Report given to RN and Post -op Vital signs reviewed and stable ? ?Post vital signs: Reviewed and stable ? ?Last Vitals:  ?Vitals Value Taken Time  ?BP 117/52 05/18/21 1425  ?Temp 36.3 ?C 05/18/21 1425  ?Pulse 89 05/18/21 1427  ?Resp 23 05/18/21 1427  ?SpO2 95 % 05/18/21 1427  ?Vitals shown include unvalidated device data. ? ?Last Pain:  ?Vitals:  ? 05/18/21 1138  ?TempSrc: Temporal  ?PainSc: 5   ?   ? ?  ? ?Complications: No notable events documented. ?

## 2021-05-18 NOTE — Discharge Instructions (Addendum)
You may resume Plavix and aspirin on Friday morning. ? ?AMBULATORY SURGERY  ?DISCHARGE INSTRUCTIONS ? ? ?The drugs that you were given will stay in your system until tomorrow so for the next 24 hours you should not: ? ?Drive an automobile ?Make any legal decisions ?Drink any alcoholic beverage ? ? ?You may resume regular meals tomorrow.  Today it is better to start with liquids and gradually work up to solid foods. ? ?You may eat anything you prefer, but it is better to start with liquids, then soup and crackers, and gradually work up to solid foods. ? ? ?Please notify your doctor immediately if you have any unusual bleeding, trouble breathing, redness and pain at the surgery site, drainage, fever, or pain not relieved by medication. ? ? ? ?Additional Instructions: ? ? ? ? ? ? ? ?Please contact your physician with any problems or Same Day Surgery at [phone], Monday through Friday 6 am to 4 pm, or [place] at [facility] number at [phone].  ?

## 2021-05-19 ENCOUNTER — Other Ambulatory Visit: Payer: Self-pay | Admitting: Primary Care

## 2021-05-19 ENCOUNTER — Other Ambulatory Visit: Payer: Self-pay | Admitting: Gastroenterology

## 2021-05-19 DIAGNOSIS — K2990 Gastroduodenitis, unspecified, without bleeding: Secondary | ICD-10-CM

## 2021-05-19 DIAGNOSIS — K2100 Gastro-esophageal reflux disease with esophagitis, without bleeding: Secondary | ICD-10-CM

## 2021-05-20 ENCOUNTER — Other Ambulatory Visit: Payer: Self-pay

## 2021-05-20 ENCOUNTER — Inpatient Hospital Stay (HOSPITAL_COMMUNITY)
Admission: EM | Admit: 2021-05-20 | Discharge: 2021-05-23 | DRG: 287 | Disposition: A | Discharge status: Home / Self Care | Payer: Medicare Other | Attending: Internal Medicine | Admitting: Internal Medicine

## 2021-05-20 ENCOUNTER — Telehealth: Payer: Self-pay | Admitting: Nurse Practitioner

## 2021-05-20 ENCOUNTER — Encounter (HOSPITAL_COMMUNITY): Payer: Self-pay

## 2021-05-20 ENCOUNTER — Inpatient Hospital Stay (HOSPITAL_COMMUNITY): Payer: Medicare Other

## 2021-05-20 ENCOUNTER — Emergency Department (HOSPITAL_COMMUNITY): Payer: Medicare Other

## 2021-05-20 ENCOUNTER — Encounter
Admission: RE | Admit: 2021-05-20 | Discharge: 2021-05-20 | Disposition: A | Discharge status: Home / Self Care | Payer: Medicare Other | Source: Ambulatory Visit | Attending: Cardiology | Admitting: Cardiology

## 2021-05-20 ENCOUNTER — Other Ambulatory Visit: Payer: Self-pay | Admitting: Pulmonary Disease

## 2021-05-20 DIAGNOSIS — Z833 Family history of diabetes mellitus: Secondary | ICD-10-CM

## 2021-05-20 DIAGNOSIS — Z79899 Other long term (current) drug therapy: Secondary | ICD-10-CM

## 2021-05-20 DIAGNOSIS — Z8719 Personal history of other diseases of the digestive system: Secondary | ICD-10-CM

## 2021-05-20 DIAGNOSIS — E785 Hyperlipidemia, unspecified: Secondary | ICD-10-CM | POA: Diagnosis not present

## 2021-05-20 DIAGNOSIS — Z7982 Long term (current) use of aspirin: Secondary | ICD-10-CM

## 2021-05-20 DIAGNOSIS — J439 Emphysema, unspecified: Secondary | ICD-10-CM | POA: Diagnosis present

## 2021-05-20 DIAGNOSIS — Z01810 Encounter for preprocedural cardiovascular examination: Secondary | ICD-10-CM | POA: Diagnosis not present

## 2021-05-20 DIAGNOSIS — R0609 Other forms of dyspnea: Secondary | ICD-10-CM | POA: Diagnosis not present

## 2021-05-20 DIAGNOSIS — F17210 Nicotine dependence, cigarettes, uncomplicated: Secondary | ICD-10-CM | POA: Diagnosis present

## 2021-05-20 DIAGNOSIS — C3492 Malignant neoplasm of unspecified part of left bronchus or lung: Secondary | ICD-10-CM | POA: Diagnosis not present

## 2021-05-20 DIAGNOSIS — R001 Bradycardia, unspecified: Secondary | ICD-10-CM | POA: Diagnosis present

## 2021-05-20 DIAGNOSIS — I442 Atrioventricular block, complete: Secondary | ICD-10-CM | POA: Diagnosis present

## 2021-05-20 DIAGNOSIS — N400 Enlarged prostate without lower urinary tract symptoms: Secondary | ICD-10-CM | POA: Diagnosis not present

## 2021-05-20 DIAGNOSIS — E1151 Type 2 diabetes mellitus with diabetic peripheral angiopathy without gangrene: Secondary | ICD-10-CM | POA: Diagnosis present

## 2021-05-20 DIAGNOSIS — Z79890 Hormone replacement therapy (postmenopausal): Secondary | ICD-10-CM | POA: Diagnosis not present

## 2021-05-20 DIAGNOSIS — Z794 Long term (current) use of insulin: Secondary | ICD-10-CM

## 2021-05-20 DIAGNOSIS — I10 Essential (primary) hypertension: Secondary | ICD-10-CM | POA: Diagnosis present

## 2021-05-20 DIAGNOSIS — R911 Solitary pulmonary nodule: Secondary | ICD-10-CM

## 2021-05-20 DIAGNOSIS — Z8261 Family history of arthritis: Secondary | ICD-10-CM | POA: Diagnosis not present

## 2021-05-20 DIAGNOSIS — Z7902 Long term (current) use of antithrombotics/antiplatelets: Secondary | ICD-10-CM | POA: Diagnosis not present

## 2021-05-20 DIAGNOSIS — I441 Atrioventricular block, second degree: Secondary | ICD-10-CM | POA: Diagnosis not present

## 2021-05-20 DIAGNOSIS — C3490 Malignant neoplasm of unspecified part of unspecified bronchus or lung: Secondary | ICD-10-CM | POA: Diagnosis present

## 2021-05-20 DIAGNOSIS — I2510 Atherosclerotic heart disease of native coronary artery without angina pectoris: Secondary | ICD-10-CM | POA: Insufficient documentation

## 2021-05-20 DIAGNOSIS — C3412 Malignant neoplasm of upper lobe, left bronchus or lung: Secondary | ICD-10-CM

## 2021-05-20 DIAGNOSIS — K219 Gastro-esophageal reflux disease without esophagitis: Secondary | ICD-10-CM | POA: Diagnosis present

## 2021-05-20 DIAGNOSIS — I25110 Atherosclerotic heart disease of native coronary artery with unstable angina pectoris: Secondary | ICD-10-CM | POA: Diagnosis present

## 2021-05-20 DIAGNOSIS — R931 Abnormal findings on diagnostic imaging of heart and coronary circulation: Secondary | ICD-10-CM | POA: Diagnosis not present

## 2021-05-20 LAB — CBC
HCT: 36.4 % — ABNORMAL LOW (ref 39.0–52.0)
Hemoglobin: 11.8 g/dL — ABNORMAL LOW (ref 13.0–17.0)
MCH: 30.8 pg (ref 26.0–34.0)
MCHC: 32.4 g/dL (ref 30.0–36.0)
MCV: 95 fL (ref 80.0–100.0)
Platelets: 223 10*3/uL (ref 150–400)
RBC: 3.83 MIL/uL — ABNORMAL LOW (ref 4.22–5.81)
RDW: 13.7 % (ref 11.5–15.5)
WBC: 10.3 10*3/uL (ref 4.0–10.5)
nRBC: 0 % (ref 0.0–0.2)

## 2021-05-20 LAB — GLUCOSE, CAPILLARY
Glucose-Capillary: 155 mg/dL — ABNORMAL HIGH (ref 70–99)
Glucose-Capillary: 228 mg/dL — ABNORMAL HIGH (ref 70–99)

## 2021-05-20 LAB — BASIC METABOLIC PANEL
Anion gap: 6 (ref 5–15)
BUN: 20 mg/dL (ref 8–23)
CO2: 26 mmol/L (ref 22–32)
Calcium: 9 mg/dL (ref 8.9–10.3)
Chloride: 106 mmol/L (ref 98–111)
Creatinine, Ser: 0.79 mg/dL (ref 0.61–1.24)
GFR, Estimated: >60 mL/min (ref >60)
Glucose, Bld: 163 mg/dL — ABNORMAL HIGH (ref 70–99)
Potassium: 3.8 mmol/L (ref 3.5–5.1)
Sodium: 138 mmol/L (ref 135–145)

## 2021-05-20 LAB — ECHOCARDIOGRAM COMPLETE: S' Lateral: 2.8 cm

## 2021-05-20 LAB — NM MYOCAR SINGLE W/SPECT
Rest Nuclear Isotope Dose: 10.8 mCi
SRS: 8
ST Depression (mm): 0 mm

## 2021-05-20 MED ORDER — ACETAMINOPHEN 325 MG PO TABS: 650.0000 mg | ORAL_TABLET | ORAL | Status: DC | PRN

## 2021-05-20 MED ORDER — ONDANSETRON HCL 4 MG/2ML IJ SOLN: 4.0000 mg | Freq: Four times a day (QID) | INTRAMUSCULAR | Status: DC | PRN

## 2021-05-20 MED ORDER — INSULIN ASPART 100 UNIT/ML IJ SOLN
0.0000 [IU] | Freq: Three times a day (TID) | INTRAMUSCULAR | Status: DC
  Administered 2021-05-20: 3 [IU] via SUBCUTANEOUS
  Administered 2021-05-21 (×2): 5 [IU] via SUBCUTANEOUS
  Administered 2021-05-21: 3 [IU] via SUBCUTANEOUS
  Administered 2021-05-22 – 2021-05-23 (×3): 5 [IU] via SUBCUTANEOUS
  Administered 2021-05-23: 3 [IU] via SUBCUTANEOUS

## 2021-05-20 MED ORDER — NITROGLYCERIN 0.4 MG SL SUBL
0.4000 mg | SUBLINGUAL_TABLET | SUBLINGUAL | Status: DC | PRN
  Filled 2021-05-20: qty 1

## 2021-05-20 MED ORDER — TECHNETIUM TC 99M TETROFOSMIN IV KIT
10.7800 | PACK | Freq: Once | INTRAVENOUS | Status: AC | PRN
  Administered 2021-05-20: 10.78 via INTRAVENOUS

## 2021-05-20 NOTE — Plan of Care (Signed)
  Problem: Education: Goal: Knowledge of General Education information will improve Description: Including pain rating scale, medication(s)/side effects and non-pharmacologic comfort measures Outcome: Progressing   Problem: Health Behavior/Discharge Planning: Goal: Ability to manage health-related needs will improve Outcome: Progressing   Problem: Clinical Measurements: Goal: Will remain free from infection Outcome: Progressing   

## 2021-05-20 NOTE — ED Notes (Signed)
Pt went here with bradycardia he  came in by pov from [place]   and is here for poss pacemaker insertion on Monday  he was in  for a stress test when his low heart rate was discovered.  Pt alert no distress at present  no pain ?

## 2021-05-20 NOTE — ED Provider Notes (Signed)
?Antonio [name] ?Provider Note ? ? ?CSN: [id number] ?Arrival date & time: 05/20/21  1153 ? ?  ? ?History ? ?Chief Complaint  ?Patient presents with  ? Bradycardia  ? ? ?Antonio [name] is a 76 y.o. male. ? ?HPI ?76 year old male transferred here from [place].  Per patient and notes patient was being seen today for cardiac procedure.  Before they proceed with the procedure they noted that his rate was low.  Patient denies any chest pain, shortness of breath, weakness, or lightheadedness.  He states that this is being done due to a work-up for a "spot on his lung".  He states that he was being evaluated to have interventions for this.  Secondary to that it was noted that his heart rate was low.  He has not been symptomatic.  Review of prehospital ? ?  ? ?Home Medications ?Prior to Admission medications   ?Medication Sig Start Date End Date Taking? Authorizing Provider  ?acetaminophen (TYLENOL) 500 MG tablet Take 1,000 mg by mouth every 6 (six) hours as needed (hip pain.). Rapid release   Yes [provider]  ?aspirin 81 MG EC tablet Take 81 mg by mouth in the morning.   Yes [provider]  ?clopidogrel (PLAVIX) 75 MG tablet Take 1 tablet (75 mg total) by mouth daily. 02/09/21  Yes Kris Hartmann, NP  ?glipiZIDE (GLUCOTROL) 10 MG tablet Take 10 mg by mouth daily before supper.  07/05/14  Yes [provider]  ?insulin detemir (LEVEMIR) 100 UNIT/ML injection Inject 20 units at bedtime. ?Patient taking differently: Inject 15 Units into the skin daily. 07/08/12  Yes Lucille Passy, MD  ?metFORMIN (GLUCOPHAGE) 500 MG tablet TAKE 2 TABLETS BY MOUTH TWICE A DAY WITH MEAL FOR DIABETES ?Patient taking differently: Take 1,000 mg by mouth 2 (two) times daily with a meal. 12/30/19  Yes Pleas Koch, NP  ?sildenafil (VIAGRA) 100 MG tablet Take 1 tablet (100 mg total) by mouth daily as needed for erectile dysfunction. 04/27/21  Yes McGowan, Larene Beach A, PA-C  ?amLODipine  (NORVASC) 10 MG tablet Take 1 tablet (10 mg total) by mouth daily. 05/13/21 07/13/25  Kate Sable, MD  ?atorvastatin (LIPITOR) 20 MG tablet TAKE 1 TABLET BY MOUTH DAILY IN THE EVENING FOR CHOLESTEROL. Office visit required for further refills. ?Patient taking differently: Take 20 mg by mouth daily. 04/26/21   Pleas Koch, NP  ?ferrous gluconate (FERGON) 324 MG tablet TAKE 1 TABLET BY MOUTH EVERY DAY WITH BREAKFAST ?Patient taking differently: Take 324 mg by mouth daily with breakfast. 04/25/21   Ladene Artist, MD  ?FIBER PO Take 1 capsule by mouth in the morning.    [provider]  ?finasteride (PROSCAR) 5 MG tablet TAKE 1 TABLET BY MOUTH EVERY DAY ?Patient taking differently: Take 5 mg by mouth daily. 04/12/21   Nori Riis, PA-C  ?Fluticasone-Umeclidin-Vilant (TRELEGY ELLIPTA) 100-62.5-25 MCG/ACT AEPB Inhale 100 mcg into the lungs daily. 05/06/21   Tyler Pita, MD  ?GLUCOSAMINE HCL PO Take 1 tablet by mouth daily.    [provider]  ?lisinopril-hydrochlorothiazide (ZESTORETIC) 20-25 MG tablet Take 1 tablet by mouth daily. For blood pressure. 05/12/21   Pleas Koch, NP  ?pantoprazole (PROTONIX) 40 MG tablet Take 1 tablet (40 mg total) by mouth daily. For heartburn 05/20/21   Pleas Koch, NP  ?tamsulosin (FLOMAX) 0.4 MG CAPS capsule TAKE 1 CAPSULE BY MOUTH 2 TIMES DAILY. ?Patient taking differently: Take 0.4 mg by mouth  2 (two) times daily. 04/12/21   Zara Council A, PA-C  ?vitamin B-12 (CYANOCOBALAMIN) 1000 MCG tablet Take 1,000 mcg by mouth in the morning.    [provider]  ?   ? ?Allergies    ?Patient has no known allergies.   ? ?Review of Systems   ?Review of Systems  ?All other systems reviewed and are negative. ? ?Physical Exam ?Updated Vital Signs ?BP (!) 164/61   Pulse (!) 40   Temp 98 ?F (36.7 ?C) (Oral)   Resp 13   SpO2 96%  ?Physical Exam ?Vitals and nursing note reviewed.  ?Constitutional:   ?   Appearance: Normal appearance.  ?HENT:   ?   Head: Normocephalic and atraumatic.  ?   Right Ear: External ear normal.  ?   Left Ear: External ear normal.  ?   Nose: Nose normal.  ?   Mouth/Throat:  ?   Pharynx: Oropharynx is clear.  ?Cardiovascular:  ?   Rate and Rhythm: Normal rate and regular rhythm.  ?   Pulses: Normal pulses.  ?   Comments: Patient with right vacillates between 40s to intermittent episodes in the 70s ?Pulmonary:  ?   Effort: Pulmonary effort is normal.  ?Abdominal:  ?   General: Abdomen is flat.  ?   Palpations: Abdomen is soft.  ?Musculoskeletal:     ?   General: Normal range of motion.  ?   Cervical back: Normal range of motion.  ?   Comments: Left leg much smaller than right leg patient states this has been resident for an extended period of time  ?Skin: ?   General: Skin is warm.  ?   Capillary Refill: Capillary refill takes less than 2 seconds.  ?Neurological:  ?   General: No focal deficit present.  ?   Mental Status: He is alert.  ?Psychiatric:     ?   Mood and Affect: Mood normal.     ?   Behavior: Behavior normal.  ? ? ?ED Results / Procedures / Treatments   ?Labs ?(all labs ordered are listed, but only abnormal results are displayed) ?Labs Reviewed  ?CBC - Abnormal; Notable for the following components:  ?    Result Value  ? RBC 3.83 (*)   ? Hemoglobin 11.8 (*)   ? HCT 36.4 (*)   ? All other components within normal limits  ?BASIC METABOLIC PANEL  ? ? ?EKG ?EKG Interpretation ? ?Date/Time:  Friday May 20 2021 12:38:30 EDT ?Ventricular Rate:  47 ?PR Interval:    ?QRS Duration: 72 ?QT Interval:  444 ?QTC Calculation: 392 ?R Axis:   1 ?Text Interpretation: Suspect complete heart block with ventricular rate 40s Cannot rule out Anterior infarct , age undetermined Abnormal ECG When compared with ECG of 11-May-2021 15:11, PREVIOUS ECG IS PRESENT Confirmed by Pattricia Boss [phone]) on 05/20/2021 2:27:10 PM ? ?Radiology ?DG Chest Port 1 View ? ?Result Date: 05/20/2021 ?CLINICAL DATA:  Bradycardia EXAM: PORTABLE CHEST 1 VIEW  COMPARISON:  05/18/2021 FINDINGS: Transverse diameter of heart is within normal limits. There are no signs of pulmonary edema or focal pulmonary consolidation. There is interval decrease in interstitial markings in the parahilar regions and lower lung fields. There is no significant pleural effusion or pneumothorax. There is calcification in the soft tissues adjacent to the greater tuberosity of proximal right humerus suggesting possible calcific bursitis or tendinosis. IMPRESSION: There are no signs of pulmonary edema or focal pulmonary consolidation. There is soft tissue calcification in the  right shoulder suggesting possible calcific bursitis or tendinosis. Electronically Signed   By: Elmer Picker M.D.   On: 05/20/2021 13:06   ? ?Procedures ?Procedures  ? ? ?Medications Ordered in ED ?Medications - No data to display ? ?ED Course/ Medical Decision Making/ A&P ?Clinical Course as of 05/20/21 1456  ?Fri May 20, 2021  ?1428 Chest x-Lindzy Rupert reviewed interpreted without any evidence of acute focal pulmonary edema or consolidation.  Ideologies interpretation concurs [DR]  ?  ?Clinical Course User Index ?[DR] Pattricia Boss, MD  ? ?                        ?Medical Decision Making ?76 year old man presents today from [facility] with complaints of bradycardia.  Patient has no complaints, however patient was seen today for preop testing.  He was noted to be in a significant bradycardia.  EKG findings are concerning for complete heart block.  Patient is hemodynamically stable here.  He is on the monitor.  Patient is on multiple antihypertensives but none of these should be significant nodal blocking medications.  His blood pressure has been stable here.  He is asymptomatic.  Care was discussed with cardiology and they will see for evaluation. ?Care discussed with Dr. Alvino Chapel and he will facilitate disposition and care. ? ? ?Amount and/or Complexity of Data Reviewed ?Labs: ordered. ?Radiology: ordered and  independent interpretation performed. Decision-making details documented in ED Course. ?ECG/medicine tests: ordered and independent interpretation performed. Decision-making details documented in ED Course. ?Discussion o

## 2021-05-20 NOTE — ED Triage Notes (Signed)
Pt arrives POV from [facility] for eval of bradycardia. Pt reports that he was at [facility] for a stress test, per note- was found to be in complete heart block w/ a rate of 39 this AM. [facility] then, per note, contacted wife to take him here POV for further eval..  ?

## 2021-05-20 NOTE — Progress Notes (Signed)
?  Echocardiogram ?2D Echocardiogram has been performed. ? ?Antonio [name] ?05/20/2021, 4:42 PM ?

## 2021-05-20 NOTE — ED Provider Triage Note (Signed)
Emergency Medicine Provider Triage Evaluation Note ? ?Antonio [name] , a 76 y.o. male  was evaluated in triage.  Patient presents here for bradycardia.  Patient was at [facility] today and was supposed to get a stress test.  Patient initially had a had a spot found on his lung and was scheduled to get a bronchoscopy with biopsy.  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  They had sent him for a stress test, and today it was seen that he was in full heart block with bradycardia into the 30s.  They told him to come to [facility], ER for evaluation. ? ?Review of Systems  ?Positive: Bradycardia, generalized weakness/fatigue ?Negative: Chest pain, shortness of breath, syncope ? ?Physical Exam  ?BP (!) 175/78 (BP Location: Left Arm)   Pulse (!) 54   Temp 98 ?F (36.7 ?C) (Oral)   Resp 15   SpO2 100%  ?Gen:   Awake, no distress   ?Resp:  Normal effort  ?MSK:   Moves extremities without difficulty  ?Other:   ? ?Medical Decision Making  ?Medically screening exam initiated at 12:41 PM.  Appropriate orders placed.  [name] was informed that the remainder of the evaluation will be completed by another provider, this initial triage assessment does not replace that evaluation, and the importance of remaining in the ED until their evaluation is complete. ? ?We will keep patient on cardiac monitoring while in triage and attempt to get him next room. ?  Kateri Plummer, PA-C ?05/20/21 1241 ? ?

## 2021-05-20 NOTE — H&P (Signed)
?Cardiology Admission History and Physical:  ? ?Patient ID: Antonio [name] ?MRN: [id number]; DOB: [birth year]  ? ?Admission date: 05/20/2021 ? ?PCP:  Pleas Koch, NP ?  ?[facility] Providers ?Cardiologist:  Dr. Garen Lah ? ? ?Chief Complaint:  bradycardia ? ?Patient Profile:  ? ?Antonio [name] is a 76 y.o. male with HTN, HLD, COPD, PVD w/prior SFA/popliteal stents b/l, + smoker who is being seen 05/20/2021 for the evaluation of bradycardia, CHB. ? ?History of Present Illness:  ? ?Antonio [name] was recently referred to Dr. Garen Lah for cardiac evaluation prior to bronchoscopy to further evaluate abnormal nodule on CT done for cancer screening he ALSO had an abnormal PET scan ? ?CT scan noted coronary Ca++ and he had an abnormal EKG, and given risk, recommended that he have an echo and stress test though felt he was a reasonable candidate for bronch to proceed as planned. ?At his visit he was bradycardic in Mobitz I, and his lopressor 25mg  BID stopped. ? ?He underwent bronchoscopy without complications on 05/01/69. ? ?He went today for his stress test (echo still pending), found bradycardic with rates 30's, EKG done with CHB, in d/w Dr. Rockey Situ, referred to [facility] ER for EP to see. ?The patient reporting a week or so of fatigue, weakness, some dizziness, and unsteady gait. ?They did note that with ambulation his HR did increase to the 60's and his BP was stable. ? ?LABS 05/11/21 ?K+ 4.1 ?BUN/Creat 24/0.72 ?WBC 9.3 ?H/H 12/38 ?Plts 280 ? ?HGB A1c 7.4 ? ?His wife at bedside in the ER, he reports feeling hungry, but otherwise OK. ?He denies any near syncope or near syncope. ?No CP ?No unusual SOB ?Perhaps generally sluggish, "not great" ? ? ?Past Medical History:  ?Diagnosis Date  ? Anemia   ? Arthritis   ? Asthma   ? age 62  ? BPH with urinary obstruction   ? stable on flomax ([name]  ? Coronary atherosclerosis of unspecified type of vessel, native or graft   ? 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 Emphysema of lung  ([place])   ? Esophageal stricture   ? Gastritis   ? GERD (gastroesophageal reflux disease)   ? Hiatal hernia   ? Hx of colonic polyps   ? Hyperlipidemia   ? Hypertension   ? Internal hemorrhoids without mention of complication   ? Lumbago   ? Pain in both lower legs   ? Pneumonia   ? PSA elevation   ? now averaging 2's  ? Tobacco abuse   ? URI (upper respiratory infection)   ? Urinary retention   ? ? ?Past Surgical History:  ?Procedure Laterality Date  ? 2D Echo  06/03  ? Abdominal ultrasound  06/03  ? Negative  ? BACK SURGERY    ? COLONOSCOPY    ? CT of head  and sinuses  06/03  ? Negative  ? CT of the chest, abdomen, pelvis  06/03  ? Chest negative; Abdomen, left adrenal lesion; Pelvis, enlarged prostate  ? ESOPHAGOGASTRODUODENOSCOPY  02/13/07  ? gastritis and duodenitis without bleed  ? ESOPHAGOGASTRODUODENOSCOPY N/A 06/26/2014  ? Procedure: ESOPHAGOGASTRODUODENOSCOPY (EGD);  Surgeon: Milus Banister, MD;  Location: [place];  Service: Endoscopy;  Laterality: N/A;  ? ESOPHAGOGASTRODUODENOSCOPY (EGD) WITH PROPOFOL N/A 04/08/2020  ? Procedure: ESOPHAGOGASTRODUODENOSCOPY (EGD) WITH PROPOFOL;  Surgeon: Ladene Artist, MD;  Location: [facility];  Service: Endoscopy;  Laterality: N/A;  ? HOT HEMOSTASIS N/A 04/08/2020  ? Procedure: HOT HEMOSTASIS (ARGON PLASMA COAGULATION/BICAP);  Surgeon: Ladene Artist, MD;  Location: [facility];  Service: Endoscopy;  Laterality: N/A;  ? KNEE ARTHROSCOPY  10/00  ? Right  ? LOWER EXTREMITY ANGIOGRAPHY Right 12/27/2017  ? Procedure: LOWER EXTREMITY ANGIOGRAPHY;  Surgeon: Algernon Huxley, MD;  Location: [place] [facility];  Service: Cardiovascular;  Laterality: Right;  ? LOWER EXTREMITY ANGIOGRAPHY Left 01/10/2018  ? Procedure: LOWER EXTREMITY ANGIOGRAPHY;  Surgeon: Algernon Huxley, MD;  Location: [place] [facility];  Service: Cardiovascular;  Laterality: Left;  ? LOWER EXTREMITY ANGIOGRAPHY Left 05/29/2018  ? Procedure: LOWER EXTREMITY ANGIOGRAPHY;  Surgeon: Algernon Huxley, MD;  Location:  [place] [facility];  Service: Cardiovascular;  Laterality: Left;  ? LOWER EXTREMITY ANGIOGRAPHY Left 05/30/2018  ? Procedure: Lower Extremity Angiography;  Surgeon: Algernon Huxley, MD;  Location: [place] [facility];  Service: Cardiovascular;  Laterality: Left;  ? LP  06/03  ? Microwave thermotherapy prostate  08/28/07  ? Rogers Blocker  ? Persantine cardiolite  03/01  ? EF 68%  ? POLYPECTOMY  11/95  ? Stress myoview  07/06  ? EF 67%  ? UPPER GASTROINTESTINAL ENDOSCOPY    ?  ? ?Medications Prior to Admission: ?Prior to Admission medications   ?Medication Sig Start Date End Date Taking? Authorizing Provider  ?acetaminophen (TYLENOL) 500 MG tablet Take 1,000 mg by mouth every 6 (six) hours as needed (hip pain.). Rapid release    [provider]  ?amLODipine (NORVASC) 10 MG tablet Take 1 tablet (10 mg total) by mouth daily. 05/13/21 07/13/25  Kate Sable, MD  ?aspirin 81 MG EC tablet Take 81 mg by mouth in the morning.    [provider]  ?atorvastatin (LIPITOR) 20 MG tablet TAKE 1 TABLET BY MOUTH DAILY IN THE EVENING FOR CHOLESTEROL. Office visit required for further refills. 04/26/21   Pleas Koch, NP  ?clopidogrel (PLAVIX) 75 MG tablet Take 1 tablet (75 mg total) by mouth daily. 02/09/21   Kris Hartmann, NP  ?ferrous gluconate (FERGON) 324 MG tablet TAKE 1 TABLET BY MOUTH EVERY DAY WITH BREAKFAST ?Patient taking differently: Take 324 mg by mouth daily with breakfast. 04/25/21   Ladene Artist, MD  ?FIBER PO Take 1 capsule by mouth in the morning.    [provider]  ?finasteride (PROSCAR) 5 MG tablet TAKE 1 TABLET BY MOUTH EVERY DAY ?Patient taking differently: Take 5 mg by mouth daily. 04/12/21   Nori Riis, PA-C  ?Fluticasone-Umeclidin-Vilant (TRELEGY ELLIPTA) 100-62.5-25 MCG/ACT AEPB Inhale 100 mcg into the lungs daily. 05/06/21   Tyler Pita, MD  ?glipiZIDE (GLUCOTROL) 10 MG tablet Take 10 mg by mouth daily before supper.  07/05/14   [provider]  ?GLUCOSAMINE  HCL PO Take 1 tablet by mouth daily.    [provider]  ?insulin detemir (LEVEMIR) 100 UNIT/ML injection Inject 20 units at bedtime. 07/08/12   Lucille Passy, MD  ?lisinopril-hydrochlorothiazide (ZESTORETIC) 20-25 MG tablet Take 1 tablet by mouth daily. For blood pressure. 05/12/21   Pleas Koch, NP  ?metFORMIN (GLUCOPHAGE) 500 MG tablet TAKE 2 TABLETS BY MOUTH TWICE A DAY WITH MEAL FOR DIABETES ?Patient taking differently: Take 1,000 mg by mouth 2 (two) times daily with a meal. 12/30/19   Pleas Koch, NP  ?pantoprazole (PROTONIX) 40 MG tablet Take 1 tablet (40 mg total) by mouth daily. For heartburn 05/20/21   Pleas Koch, NP  ?sildenafil (VIAGRA) 100 MG tablet Take 1 tablet (100 mg total) by mouth daily as needed  for erectile dysfunction. 04/27/21   Zara Council A, PA-C  ?tamsulosin (FLOMAX) 0.4 MG CAPS capsule TAKE 1 CAPSULE BY MOUTH 2 TIMES DAILY. ?Patient taking differently: Take 0.4 mg by mouth 2 (two) times daily. 04/12/21   Zara Council A, PA-C  ?vitamin B-12 (CYANOCOBALAMIN) 1000 MCG tablet Take 1,000 mcg by mouth in the morning.    [provider]  ?  ? ?Allergies:   No Known Allergies ? ?Social History:   ?Social History  ? ?Socioeconomic History  ? Marital status: Widowed  ?  Spouse name: [name]  ? Number of children: 3  ? Years of education: Not on file  ? Highest education level: Not on file  ?Occupational History  ? Occupation: Retired Games developer  ?Tobacco Use  ? Smoking status: Every Day  ?  Packs/day: 2.00  ?  Years: 57.00  ?  Pack years: 114.00  ?  Types: Cigarettes  ? Smokeless tobacco: Former  ?  Types: Chew  ? Tobacco comments:  ?  0.5 PPD since 04/20/2021  ?Vaping Use  ? Vaping Use: Never used  ?Substance and Sexual Activity  ? Alcohol use: Not Currently  ?  Alcohol/week: 0.0 standard drinks  ?  Comment: rare  ? Drug use: No  ? Sexual activity: Yes  ?Other Topics Concern  ? Not on file  ?Social History Narrative  ? Married 9/09 wife with moderate memory  problems.  ? 3 adult children, 2 grandchildren  ? Would desire CPR  ? ?Social Determinants of Health  ? ?Financial Resource Strain: Low Risk   ? Difficulty of Paying Living Expenses: Not very hard  ?Food Insecu

## 2021-05-20 NOTE — Progress Notes (Signed)
Cytology - Non PAP; LUL TBNA     Status: None  ? Collection Time: 05/18/21  1:35 PM  ?Result Value Ref Range  ? CYTOLOGY - NON GYN    ?  CYTOLOGY - NON PAP ?```` THIS IS AN ADDENDUM REPORT ~~~ ?CASE: [id number] ?PATIENT: Antonio [name] ?Non-Gynecological Cytology Report ?````````Addendum `````````` ? ?Reason for Addendum #1:  Ancillary Studies ? ?Specimen Submitted: ?A. Lung, LUL; FNA ? ?Clinical History: 76 YO smoker with LUL nodule noted on lung cancer ?screening.  R/O CA ? ? ? ? ? ?DIAGNOSIS: ?A.  LUNG, LEFT UPPER LOBE, NODULE, FINE-NEEDLE ASPIRATE ?RARE CLUSTERS OF HYPERCHROMATIC ATYPICAL CELLS IN THE CELLBLOCK, ?SUSPICIOUS FOR NON-SMALL CELL CARCINOMA (SEE COMMENT) ? ?Comment: The multiple direct aspirate smears show primarily blood.  The ?cellblock shows benign bronchial epithelial cells admixed with rare ?clusters of cells that show nuclear hyperchromasia, mild to focally ?moderate nuclear enlargement and rare nucleoli.  The cells show ?occasional scant to moderate cytoplasm.  Immunohistochemical stains show ?no staining for p40.  Focal staining of the atypical cells with TTF-1 ?and Napsin.  The clinical histo ry is reviewed.  The solitary PET ?positive left upper lobe nodule is noted.  Overall findings are highly ?suspicious for non-small cell carcinoma but the given the limited amount ?of material, extensive clinical and radiographic correlation is ?required. ? ?GROSS DESCRIPTION: ?A. Site: LUL, lung ?Procedure: ENB ?Cytology specimen: FNA/needle biopsy, rinse ?Cytotechnologist(s): Chauncey Cruel, Ashlee Howze-Soremekun, Germain Osgood ?[facility] ?Specimen material collected and submitted for: ?4 Diff Quik stained slides ?4 Pap stained slides ?Specimen material submitted for: Cell block and ThinPrep ? ?The cell block material is fixed in formalin for 6 hours prior to ?processing. ? ?Specimen description: ?Fixative: CytoLyt ?Volume: Approximately 50 mL ?Color: Red ?Transparency: Cloudy ?Tissue fragments present:  Yes ?Collection brush(es) present: No ? ?RB 05/18/2021 ? ? ? ?Final Diagnosis performed by Theodora Blow, MD.   Electronically signed ?05/20/2021 1:38:26PM ?The electronic signature indicates that t he named Attending Pathologist ?has evaluated the specimen ?Technical component performed at [facility], [address] [place], ?[place] [address] Lab: [phone] Dir: Rush Farmer, MD, MMM ? Professional component performed at [facility], [facility], [place], [place], [place] [address] Lab: [phone] ?Dir: Kathi Simpers, MD ? ?ADDENDUM: ? ?IHC slides were prepared by [facility] and ?Pathology, [place], [place]. All controls stained appropriately. ? ?This test was developed and its performance characteristics determined ?by [facility]. It has not been cleared or approved by the [place] Food and Drug ?Administration. The FDA does not require this test to go through ?premarket FDA review. This test is used for clinical purposes. It should ?not be regarded as investigational or for research. This laboratory is ?certified under the Clinical Laboratory Improvement Amendments (CLIA) as ?qualified to perform high complexity clinical laboratory testing. ?  ? ? ?Addendum #1 performed by Theodora Blow, MD.   Electronically signed ?05/20/2021 3:36:02PM ?The electronic signature indicates that the named Attending Pathologist ?has evaluated the specimen ?Technical component performed at [facility], [address] [place], ?[place] [address] Lab: [phone] Dir: Rush Farmer, MD, MMM ? Professional component performed at [facility], [facility], [place], [place], [place] [address] Lab: [phone] ?Dir: Kathi Simpers, MD ?  ? ? ?Notified patient via [facility] Sidle (patient gave permission to discuss findings with Butch Penny) findings from bronchoscopic sample pathology.  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  Given that his PET/CT shows activity in this area and that he is  not  a surgical candidate he will be referred for SBRT of this lesion.  This possibility had been discussed with the patient previously.  The patient is currently at [facility] awaiting pacemaker implantation after being noted to be in complete heart block during routine visit with cardiology this morning.  Of note no significant arrhythmias nor heart block was noted during his procedure on the 12th.  Only noted was his already known first-degree heart block. ? ?C. Derrill Kay, MD ?Advanced Bronchoscopy ?PCCM  [facility] ? ? ? ?*This note was dictated using voice recognition software/Dragon.  Despite best efforts to proofread, errors can occur which can change the meaning. Any transcriptional errors that result from this process are unintentional and may not be fully corrected at the time of dictation. ?

## 2021-05-20 NOTE — Telephone Encounter (Signed)
? ?  Pt presented for elective lexiscan myoview this AM.  Initial ECG w/ evidence of complete heart block @ 39 bpm.  He is not on any AVN blocking agents @ home and does have a h/o Mobitz I.  Rhythm strips obtained and reviewed with Dr. Rockey Situ.  Pt reports weakness and fatigue w/ intermittent lightheadedness, and unsteady gait over the past week, along w/ chronic DOE.  Discussed ECG findings and symptomatic bradycardia/heart block.  With ambulation in stress lab, HR did rise to the mid-60's.  Stable BPs.  Pt to contact wife to pick him up and drive him to [facility] ED for EP evaluation today. ? ?Murray Hodgkins, NP ?05/20/2021, 11:09 AM   ?

## 2021-05-21 DIAGNOSIS — C3492 Malignant neoplasm of unspecified part of left bronchus or lung: Secondary | ICD-10-CM | POA: Diagnosis not present

## 2021-05-21 DIAGNOSIS — I441 Atrioventricular block, second degree: Secondary | ICD-10-CM

## 2021-05-21 DIAGNOSIS — R001 Bradycardia, unspecified: Secondary | ICD-10-CM

## 2021-05-21 LAB — BASIC METABOLIC PANEL
Anion gap: 6 (ref 5–15)
BUN: 18 mg/dL (ref 8–23)
CO2: 26 mmol/L (ref 22–32)
Calcium: 9.3 mg/dL (ref 8.9–10.3)
Chloride: 106 mmol/L (ref 98–111)
Creatinine, Ser: 0.87 mg/dL (ref 0.61–1.24)
GFR, Estimated: >60 mL/min (ref >60)
Glucose, Bld: 207 mg/dL — ABNORMAL HIGH (ref 70–99)
Potassium: 4.2 mmol/L (ref 3.5–5.1)
Sodium: 138 mmol/L (ref 135–145)

## 2021-05-21 LAB — GLUCOSE, CAPILLARY
Glucose-Capillary: 207 mg/dL — ABNORMAL HIGH (ref 70–99)
Glucose-Capillary: 201 mg/dL — ABNORMAL HIGH (ref 70–99)
Glucose-Capillary: 196 mg/dL — ABNORMAL HIGH (ref 70–99)
Glucose-Capillary: 132 mg/dL — ABNORMAL HIGH (ref 70–99)

## 2021-05-21 NOTE — Progress Notes (Signed)
? ?  Initial plan was for [facility] tomorrow for further evaluation of dyspnea with concerns that this may be an anginal equivalent. However, I spoke with Dr. Caryl Comes and decision was made to do a coronary CTA instead given concern of Lexiscan with high grade AV block. I called CT and there is a tech available tomorrow to help with this. Dr. Margaretann Loveless said that she could read this. Placed orders. ? ?Darreld Mclean, PA-C ?05/21/2021 6:29 PM ? ?

## 2021-05-21 NOTE — Progress Notes (Addendum)
? ? ? ? ? ?Patient Name: Antonio [name] ?  ?76 year old gentleman admitted following a presentation for an outpatient stress test where he was found to have bradycardia and rates in the 30s.  ECG reviewed demonstrated Wenckebach ?ECGs reviewed, 4/5 sinus bradycardia first-degree AV block ?4/7 question sinus exit block in addition to bradycardia and first-degree AV block ?4/14 sinus at about 55 first-degree AV block, Wenckebach heart block and a blocked PAC ?Stress test images also consistent with Wenckebach with grouped beating of bradycardia ? ?Lung mass evaluation ongoing left parahilar >> non-small cell by biopsy ?SUBJECTIVE: ?Feel not bad.  Reviewing of the history exercise tolerance is gradually decreased over the last couple of years.  There are orthopedic issues as well as not on. ?Review of his heart rates over the last 5 years shows that his mean sinus rate is about 50.  We only have 1 ECG from 2016 until now.  When showed sinus rhythm with first-degree AV block somewhat more mild. ?No lightheadedness or presyncope ? ? ?Past Medical History:  ?Diagnosis Date  ? Anemia   ? Arthritis   ? Asthma   ? age 87  ? BPH with urinary obstruction   ? stable on flomax ([name]  ? Coronary atherosclerosis of unspecified type of vessel, native or graft   ? 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 Emphysema of lung ([place])   ? Esophageal stricture   ? Gastritis   ? GERD (gastroesophageal reflux disease)   ? Hiatal hernia   ? Hx of colonic polyps   ? Hyperlipidemia   ? Hypertension   ? Internal hemorrhoids without mention of complication   ? Lumbago   ? Pain in both lower legs   ? Pneumonia   ? PSA elevation   ? now averaging 2's  ? Tobacco abuse   ? URI (upper respiratory infection)   ? Urinary retention   ? ? ?Scheduled Meds: ? ?Scheduled Meds: ? insulin aspart  0-15 Units Subcutaneous TID WC  ? ?Continuous Infusions: ?acetaminophen, nitroGLYCERIN, ondansetron (ZOFRAN) IV ? ? ? ?PHYSICAL EXAM ?Vitals:  ? 05/20/21 1920 05/21/21  0048 05/21/21 0456 05/21/21 1150  ?BP: (!) 141/61 122/83 (!) 132/100 (!) 135/54  ?Pulse: (!) 49 (!) 50 (!) 50   ?Resp: 17 17 16 20   ?Temp: 98.3 ?F (36.8 ?C) 98.3 ?F (36.8 ?C) 97.8 ?F (36.6 ?C) 98.7 ?F (37.1 ?C)  ?TempSrc: Oral Oral Oral Oral  ?SpO2: 97% 93% 93%   ?Weight:   71.2 kg   ?Height:      ? ? ?Well developed and nourished in no acute distress ?HENT normal ?Neck supple with JVP-  flat   ?Clear ?Regular rate and rhythm, no murmurs or gallops ?Abd-soft with active BS ?No Clubbing cyanosis edema ?Skin-warm and dry ?A & Oriented  Grossly normal sensory and motor function ? ?ECG (As above)   ? ?TELEMETRY: Reviewed personnally pt in sinus with intermittent second-degree AV block type I and possibly sinus exit block: ? ?  ? ? ?Intake/Output Summary (Last 24 hours) at 05/21/2021 1335 ?Last data filed at 05/21/2021 0600 ?Gross per 24 hour  ?Intake 240 ml  ?Output 1280 ml  ?Net -1040 ml  ? ? ?LABS: ?Basic Metabolic Panel: ?Recent Labs  ?Lab 05/20/21 ?1413 05/21/21 ?0415  ?NA 138 138  ?K 3.8 4.2  ?CL 106 106  ?CO2 26 26  ?GLUCOSE 163* 207*  ?BUN 20 18  ?CREATININE 0.79 0.87  ?CALCIUM 9.0 9.3  ? ?Cardiac  Enzymes: ?No results for input(s): CKTOTAL, CKMB, CKMBINDEX, TROPONINI in the last 72 hours. ?CBC: ?Recent Labs  ?Lab 05/20/21 ?1413  ?WBC 10.3  ?HGB 11.8*  ?HCT 36.4*  ?MCV 95.0  ?PLT 223  ? ?  ?Echo demonstrated normal LV function ? ?ASSESSMENT AND PLAN: ? 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 ?Lung cancer-non-small cell ? ?Peripheral vascular disease ? ?The patient has longstanding sinus bradycardia, basically unchanged over the last 5 years.  Heart rates are little bit slower now, this is related to both evidence of sinus exit block as well as Mobitz 1 second-degree AV block.  I do not see complete heart block.  There is grouped beating on the tracings consistent with a Mobitz physiology.  There is 1 blocked PAC.  I do not think that pacing is clearly indicated as it is not clear to me that he has  significant symptoms attributable to his longstanding sinus bradycardia. ? ?We will undertake Myoview scanning as it is more likely that there is an anginal equivalent to some of his dyspnea as it was interesting with a leg raises in the bed his heart rate went into the mid 70s very quickly suggesting that there is chronotropic competence. ? ?I reviewed this extensively with the family.  We will undertake Myoview scanning with Lexi in the morning and then make a final decision regarding pacing ? ? ? ?Signed, ?Virl Axe MD ? ?05/21/2021  ?

## 2021-05-22 ENCOUNTER — Inpatient Hospital Stay (HOSPITAL_COMMUNITY): Payer: Medicare Other

## 2021-05-22 ENCOUNTER — Other Ambulatory Visit: Payer: Self-pay | Admitting: Internal Medicine

## 2021-05-22 ENCOUNTER — Inpatient Hospital Stay (HOSPITAL_COMMUNITY)
Admit: 2021-05-22 | Discharge: 2021-05-22 | Disposition: A | Discharge status: Home / Self Care | Payer: Medicare Other | Attending: Internal Medicine | Admitting: Internal Medicine

## 2021-05-22 DIAGNOSIS — R931 Abnormal findings on diagnostic imaging of heart and coronary circulation: Secondary | ICD-10-CM

## 2021-05-22 DIAGNOSIS — I2510 Atherosclerotic heart disease of native coronary artery without angina pectoris: Secondary | ICD-10-CM

## 2021-05-22 DIAGNOSIS — I442 Atrioventricular block, complete: Secondary | ICD-10-CM | POA: Diagnosis not present

## 2021-05-22 LAB — GLUCOSE, CAPILLARY
Glucose-Capillary: 210 mg/dL — ABNORMAL HIGH (ref 70–99)
Glucose-Capillary: 245 mg/dL — ABNORMAL HIGH (ref 70–99)
Glucose-Capillary: 250 mg/dL — ABNORMAL HIGH (ref 70–99)
Glucose-Capillary: 240 mg/dL — ABNORMAL HIGH (ref 70–99)

## 2021-05-22 MED ORDER — SODIUM CHLORIDE 0.9% FLUSH
3.0000 mL | Freq: Two times a day (BID) | INTRAVENOUS | Status: DC
  Administered 2021-05-22 – 2021-05-23 (×3): 3 mL via INTRAVENOUS

## 2021-05-22 MED ORDER — IOHEXOL 350 MG/ML SOLN
100.0000 mL | Freq: Once | INTRAVENOUS | Status: AC | PRN
  Administered 2021-05-22: 100 mL via INTRAVENOUS

## 2021-05-22 NOTE — Plan of Care (Signed)
?  Problem: Education: ?Goal: Ability to verbalize understanding of medication therapies will improve ?Outcome: Progressing ?  ?Problem: Education: ?Goal: Knowledge of General Education information will improve ?Description: Including pain rating scale, medication(s)/side effects and non-pharmacologic comfort measures ?Outcome: Progressing ?  ?

## 2021-05-22 NOTE — H&P (View-Only) (Signed)
? ? ? ? ? ?Patient Name: Antonio [name] ?  ?76 year old gentleman admitted following a presentation for an outpatient stress test where he was found to have bradycardia and rates in the 30s.  ECG reviewed demonstrated Wenckebach ?ECGs reviewed, 4/5 sinus bradycardia first-degree AV block ?4/7 question sinus exit block in addition to bradycardia and first-degree AV block ?4/14 sinus at about 55 first-degree AV block, Wenckebach heart block and a blocked PAC ?Stress test images also consistent with Wenckebach with grouped beating of bradycardia ? ?Lung mass evaluation ongoing left parahilar >> non-small cell by biopsy ? ? ?SUBJECTIVE: ?Feels good.  No lightheadedness ?CTA pending. ? ? ?Past Medical History:  ?Diagnosis Date  ? Anemia   ? Arthritis   ? Asthma   ? age 10  ? BPH with urinary obstruction   ? stable on flomax ([name]  ? Coronary atherosclerosis of unspecified type of vessel, native or graft   ? 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 Emphysema of lung ([place])   ? Esophageal stricture   ? Gastritis   ? GERD (gastroesophageal reflux disease)   ? Hiatal hernia   ? Hx of colonic polyps   ? Hyperlipidemia   ? Hypertension   ? Internal hemorrhoids without mention of complication   ? Lumbago   ? Pain in both lower legs   ? Pneumonia   ? PSA elevation   ? now averaging 2's  ? Tobacco abuse   ? URI (upper respiratory infection)   ? Urinary retention   ? ? ?Scheduled Meds: ? ?Scheduled Meds: ? insulin aspart  0-15 Units Subcutaneous TID WC  ? ?Continuous Infusions: ?acetaminophen, nitroGLYCERIN, ondansetron (ZOFRAN) IV ? ? ? ?PHYSICAL EXAM ?Vitals:  ? 05/22/21 0500 05/22/21 0600 05/22/21 0700 05/22/21 0803  ?BP:    (!) 133/57  ?Pulse:    64  ?Resp: (!) 22 (!) 21 14 18   ?Temp:    98.6 ?F (37 ?C)  ?TempSrc:    Oral  ?SpO2:    91%  ?Weight:      ?Height:      ? ? ?Well developed and nourished in no acute distress ?HENT normal ?Neck supple with JVP-  flat   ?Clear ?Regular rate and rhythm, no murmurs or gallops ?Abd-soft with  active BS ?No Clubbing cyanosis edema ?Skin-warm and dry ?A & Oriented  Grossly normal sensory and motor function ? ?ECG (As above)   ? ?TELEMETRY: Reviewed personnally pt in sinus with intermittent second-degree AV block type I and possibly sinus exit block: ? ?  ? ? ?Intake/Output Summary (Last 24 hours) at 05/22/2021 1122 ?Last data filed at 05/22/2021 2585 ?Gross per 24 hour  ?Intake 240 ml  ?Output 750 ml  ?Net -510 ml  ? ? ? ?LABS: ?Basic Metabolic Panel: ?Recent Labs  ?Lab 05/20/21 ?1413 05/21/21 ?0415  ?NA 138 138  ?K 3.8 4.2  ?CL 106 106  ?CO2 26 26  ?GLUCOSE 163* 207*  ?BUN 20 18  ?CREATININE 0.79 0.87  ?CALCIUM 9.0 9.3  ? ? ?Cardiac Enzymes: ?No results for input(s): CKTOTAL, CKMB, CKMBINDEX, TROPONINI in the last 72 hours. ?CBC: ?Recent Labs  ?Lab 05/20/21 ?1413  ?WBC 10.3  ?HGB 11.8*  ?HCT 36.4*  ?MCV 95.0  ?PLT 223  ? ? ?  ?Echo demonstrated normal LV function ? ?ASSESSMENT AND PLAN: ? 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 ?Lung cancer-non-small cell ? ?Peripheral vascular disease ? ?With bradycardia we will undertake CTA  To look anginal equivalent for dyspnea--- we can follow this up with him as an outpatient. ? ?Heart rate excursion yesterday suggested that chronotropic competence was not the issue and that there is longstanding bradycardia. ? ?I will see him in [place] in about 4-6 weeks ? ?We will discharge on lisinopril HCT 20/25.  Blood pressure is pretty good so will not resume amlodipine.  Continue clopidogrel 75 and diabetic medications ? ?  ? ? ?Signed, ?Virl Axe MD ? ?05/22/2021  ?

## 2021-05-22 NOTE — Progress Notes (Signed)
? ? ?  Preliminary review of coronary CTA with significant calcifications per Dr. Margaretann Loveless. Discussed with patient the need to proceed with cardiac cath tomorrow to further define anatomy. Placed on add-on board.  ? ?Shared Decision Making/Informed Consent ?The risks [stroke (1 in 1000), death (1 in 39), kidney failure [usually temporary] (1 in 500), bleeding (1 in 200), allergic reaction [possibly serious] (1 in 200)], benefits (diagnostic support and management of coronary artery disease) and alternatives of a cardiac catheterization were discussed in detail with Antonio [name] and he is willing to proceed.  ? ?Cath orders placed. NPO at midnight. Updated Dr. Caryl Comes regarding CT and plan.  ? ?Signed, ?Reino Bellis, NP-C ?05/22/2021, 1:00 PM ?Pager: [phone] ? ?

## 2021-05-22 NOTE — Progress Notes (Signed)
? ? ? ? ? ?Patient Name: Antonio [name] ?  ?76 year old gentleman admitted following a presentation for an outpatient stress test where he was found to have bradycardia and rates in the 30s.  ECG reviewed demonstrated Wenckebach ?ECGs reviewed, 4/5 sinus bradycardia first-degree AV block ?4/7 question sinus exit block in addition to bradycardia and first-degree AV block ?4/14 sinus at about 55 first-degree AV block, Wenckebach heart block and a blocked PAC ?Stress test images also consistent with Wenckebach with grouped beating of bradycardia ? ?Lung mass evaluation ongoing left parahilar >> non-small cell by biopsy ? ? ?SUBJECTIVE: ?Feels good.  No lightheadedness ?CTA pending. ? ? ?Past Medical History:  ?Diagnosis Date  ? Anemia   ? Arthritis   ? Asthma   ? age 39  ? BPH with urinary obstruction   ? stable on flomax ([name]  ? Coronary atherosclerosis of unspecified type of vessel, native or graft   ? 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 Emphysema of lung ([place])   ? Esophageal stricture   ? Gastritis   ? GERD (gastroesophageal reflux disease)   ? Hiatal hernia   ? Hx of colonic polyps   ? Hyperlipidemia   ? Hypertension   ? Internal hemorrhoids without mention of complication   ? Lumbago   ? Pain in both lower legs   ? Pneumonia   ? PSA elevation   ? now averaging 2's  ? Tobacco abuse   ? URI (upper respiratory infection)   ? Urinary retention   ? ? ?Scheduled Meds: ? ?Scheduled Meds: ? insulin aspart  0-15 Units Subcutaneous TID WC  ? ?Continuous Infusions: ?acetaminophen, nitroGLYCERIN, ondansetron (ZOFRAN) IV ? ? ? ?PHYSICAL EXAM ?Vitals:  ? 05/22/21 0500 05/22/21 0600 05/22/21 0700 05/22/21 0803  ?BP:    (!) 133/57  ?Pulse:    64  ?Resp: (!) 22 (!) 21 14 18   ?Temp:    98.6 ?F (37 ?C)  ?TempSrc:    Oral  ?SpO2:    91%  ?Weight:      ?Height:      ? ? ?Well developed and nourished in no acute distress ?HENT normal ?Neck supple with JVP-  flat   ?Clear ?Regular rate and rhythm, no murmurs or gallops ?Abd-soft with  active BS ?No Clubbing cyanosis edema ?Skin-warm and dry ?A & Oriented  Grossly normal sensory and motor function ? ?ECG (As above)   ? ?TELEMETRY: Reviewed personnally pt in sinus with intermittent second-degree AV block type I and possibly sinus exit block: ? ?  ? ? ?Intake/Output Summary (Last 24 hours) at 05/22/2021 1122 ?Last data filed at 05/22/2021 4098 ?Gross per 24 hour  ?Intake 240 ml  ?Output 750 ml  ?Net -510 ml  ? ? ? ?LABS: ?Basic Metabolic Panel: ?Recent Labs  ?Lab 05/20/21 ?1413 05/21/21 ?0415  ?NA 138 138  ?K 3.8 4.2  ?CL 106 106  ?CO2 26 26  ?GLUCOSE 163* 207*  ?BUN 20 18  ?CREATININE 0.79 0.87  ?CALCIUM 9.0 9.3  ? ? ?Cardiac Enzymes: ?No results for input(s): CKTOTAL, CKMB, CKMBINDEX, TROPONINI in the last 72 hours. ?CBC: ?Recent Labs  ?Lab 05/20/21 ?1413  ?WBC 10.3  ?HGB 11.8*  ?HCT 36.4*  ?MCV 95.0  ?PLT 223  ? ? ?  ?Echo demonstrated normal LV function ? ?ASSESSMENT AND PLAN: ? 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 ?Lung cancer-non-small cell ? ?Peripheral vascular disease ? ?With bradycardia we will undertake CTA  To look anginal equivalent for dyspnea--- we can follow this up with him as an outpatient. ? ?Heart rate excursion yesterday suggested that chronotropic competence was not the issue and that there is longstanding bradycardia. ? ?I will see him in [place] in about 4-6 weeks ? ?We will discharge on lisinopril HCT 20/25.  Blood pressure is pretty good so will not resume amlodipine.  Continue clopidogrel 75 and diabetic medications ? ?  ? ? ?Signed, ?Virl Axe MD ? ?05/22/2021  ?

## 2021-05-23 ENCOUNTER — Encounter (HOSPITAL_COMMUNITY)
Admission: EM | Disposition: A | Discharge status: Home / Self Care | Payer: Self-pay | Source: Home / Self Care | Attending: Cardiology

## 2021-05-23 ENCOUNTER — Encounter (HOSPITAL_COMMUNITY): Payer: Self-pay | Admitting: Internal Medicine

## 2021-05-23 ENCOUNTER — Inpatient Hospital Stay: Payer: Self-pay

## 2021-05-23 DIAGNOSIS — I442 Atrioventricular block, complete: Secondary | ICD-10-CM | POA: Diagnosis not present

## 2021-05-23 DIAGNOSIS — I2510 Atherosclerotic heart disease of native coronary artery without angina pectoris: Secondary | ICD-10-CM

## 2021-05-23 HISTORY — PX: LEFT HEART CATH AND CORONARY ANGIOGRAPHY: CATH118249

## 2021-05-23 LAB — GLUCOSE, CAPILLARY
Glucose-Capillary: 235 mg/dL — ABNORMAL HIGH (ref 70–99)
Glucose-Capillary: 196 mg/dL — ABNORMAL HIGH (ref 70–99)
Glucose-Capillary: 172 mg/dL — ABNORMAL HIGH (ref 70–99)

## 2021-05-23 SURGERY — LEFT HEART CATH AND CORONARY ANGIOGRAPHY
Anesthesia: LOCAL

## 2021-05-23 SURGERY — PACEMAKER IMPLANT

## 2021-05-23 MED ORDER — FERROUS GLUCONATE 324 (38 FE) MG PO TABS
324.0000 mg | ORAL_TABLET | Freq: Every day | ORAL | Status: DC
  Administered 2021-05-23: 324 mg via ORAL
  Filled 2021-05-23 (×2): qty 1

## 2021-05-23 MED ORDER — TAMSULOSIN HCL 0.4 MG PO CAPS
0.4000 mg | ORAL_CAPSULE | Freq: Two times a day (BID) | ORAL | Status: DC
  Administered 2021-05-23: 0.4 mg via ORAL
  Filled 2021-05-23: qty 1

## 2021-05-23 MED ORDER — SODIUM CHLORIDE 0.9 % IV SOLN
INTRAVENOUS | Status: AC
  Administered 2021-05-23: 500 mL via INTRAVENOUS

## 2021-05-23 MED ORDER — FLUTICASONE FUROATE-VILANTEROL 100-25 MCG/ACT IN AEPB
1.0000 | INHALATION_SPRAY | Freq: Every day | RESPIRATORY_TRACT | Status: DC
  Administered 2021-05-23: 1 via RESPIRATORY_TRACT
  Filled 2021-05-23: qty 28

## 2021-05-23 MED ORDER — SODIUM CHLORIDE 0.9% FLUSH: 3.0000 mL | Freq: Two times a day (BID) | INTRAVENOUS | Status: DC

## 2021-05-23 MED ORDER — VERAPAMIL HCL 2.5 MG/ML IV SOLN: INTRAVENOUS | Status: DC | PRN

## 2021-05-23 MED ORDER — ASPIRIN 81 MG PO CHEW: 81.0000 mg | CHEWABLE_TABLET | Freq: Every morning | ORAL | Status: DC

## 2021-05-23 MED ORDER — LABETALOL HCL 5 MG/ML IV SOLN: 10.0000 mg | INTRAVENOUS | Status: AC | PRN

## 2021-05-23 MED ORDER — FENTANYL CITRATE (PF) 100 MCG/2ML IJ SOLN
INTRAMUSCULAR | Status: DC | PRN
  Administered 2021-05-23: 25 ug via INTRAVENOUS

## 2021-05-23 MED ORDER — FINASTERIDE 5 MG PO TABS
5.0000 mg | ORAL_TABLET | Freq: Every day | ORAL | Status: DC
  Administered 2021-05-23: 5 mg via ORAL
  Filled 2021-05-23: qty 1

## 2021-05-23 MED ORDER — UMECLIDINIUM BROMIDE 62.5 MCG/ACT IN AEPB
1.0000 | INHALATION_SPRAY | Freq: Every day | RESPIRATORY_TRACT | Status: DC
  Administered 2021-05-23: 1 via RESPIRATORY_TRACT
  Filled 2021-05-23: qty 7

## 2021-05-23 MED ORDER — SODIUM CHLORIDE 0.9 % IV SOLN: 250.0000 mL | INTRAVENOUS | Status: DC | PRN

## 2021-05-23 MED ORDER — VERAPAMIL HCL 2.5 MG/ML IV SOLN
INTRAVENOUS | Status: AC
  Filled 2021-05-23: qty 2

## 2021-05-23 MED ORDER — HEPARIN (PORCINE) IN NACL 1000-0.9 UT/500ML-% IV SOLN
INTRAVENOUS | Status: AC
  Filled 2021-05-23: qty 1000

## 2021-05-23 MED ORDER — IOHEXOL 350 MG/ML SOLN
INTRAVENOUS | Status: DC | PRN
  Administered 2021-05-23: 35 mL

## 2021-05-23 MED ORDER — SODIUM CHLORIDE 0.9% FLUSH: 3.0000 mL | INTRAVENOUS | Status: DC | PRN

## 2021-05-23 MED ORDER — PANTOPRAZOLE SODIUM 40 MG PO TBEC
40.0000 mg | DELAYED_RELEASE_TABLET | Freq: Every day | ORAL | Status: DC
  Administered 2021-05-23: 40 mg via ORAL
  Filled 2021-05-23: qty 1

## 2021-05-23 MED ORDER — SODIUM CHLORIDE 0.9 % WEIGHT BASED INFUSION
1.0000 mL/kg/h | INTRAVENOUS | Status: DC
  Administered 2021-05-23: 1 mL/kg/h via INTRAVENOUS

## 2021-05-23 MED ORDER — HEPARIN SODIUM (PORCINE) 1000 UNIT/ML IJ SOLN
INTRAMUSCULAR | Status: AC
  Filled 2021-05-23: qty 10

## 2021-05-23 MED ORDER — LIDOCAINE HCL (PF) 1 % IJ SOLN
INTRAMUSCULAR | Status: AC
  Filled 2021-05-23: qty 30

## 2021-05-23 MED ORDER — HYDRALAZINE HCL 20 MG/ML IJ SOLN: 10.0000 mg | INTRAMUSCULAR | Status: AC | PRN

## 2021-05-23 MED ORDER — MIDAZOLAM HCL 2 MG/2ML IJ SOLN
INTRAMUSCULAR | Status: DC | PRN
  Administered 2021-05-23: 1 mg via INTRAVENOUS

## 2021-05-23 MED ORDER — HEPARIN SODIUM (PORCINE) 1000 UNIT/ML IJ SOLN
INTRAMUSCULAR | Status: DC | PRN
  Administered 2021-05-23: 5000 [IU] via INTRAVENOUS

## 2021-05-23 MED ORDER — ATORVASTATIN CALCIUM 10 MG PO TABS
20.0000 mg | ORAL_TABLET | Freq: Every day | ORAL | Status: DC
  Administered 2021-05-23: 20 mg via ORAL
  Filled 2021-05-23 (×2): qty 2

## 2021-05-23 MED ORDER — MIDAZOLAM HCL 2 MG/2ML IJ SOLN
INTRAMUSCULAR | Status: AC
  Filled 2021-05-23: qty 2

## 2021-05-23 MED ORDER — ASPIRIN 81 MG PO CHEW
81.0000 mg | CHEWABLE_TABLET | ORAL | Status: AC
  Administered 2021-05-23: 81 mg via ORAL
  Filled 2021-05-23: qty 1

## 2021-05-23 MED ORDER — ONDANSETRON HCL 4 MG/2ML IJ SOLN: 4.0000 mg | Freq: Four times a day (QID) | INTRAMUSCULAR | Status: DC | PRN

## 2021-05-23 MED ORDER — ACETAMINOPHEN 325 MG PO TABS: 650.0000 mg | ORAL_TABLET | ORAL | Status: DC | PRN

## 2021-05-23 MED ORDER — LIDOCAINE HCL (PF) 1 % IJ SOLN
INTRAMUSCULAR | Status: DC | PRN
  Administered 2021-05-23: 2 mL

## 2021-05-23 MED ORDER — FENTANYL CITRATE (PF) 100 MCG/2ML IJ SOLN
INTRAMUSCULAR | Status: AC
  Filled 2021-05-23: qty 2

## 2021-05-23 MED ORDER — SODIUM CHLORIDE 0.9 % WEIGHT BASED INFUSION
3.0000 mL/kg/h | INTRAVENOUS | Status: DC
  Administered 2021-05-23: 3 mL/kg/h via INTRAVENOUS

## 2021-05-23 SURGICAL SUPPLY — 13 items
CATH DIAG 6FR JL4 (CATHETERS) ×1 IMPLANT
CATH DIAG 6FR JR4 (CATHETERS) ×1 IMPLANT
CATH DIAG 6FR PIGTAIL ANGLED (CATHETERS) ×1 IMPLANT
CATH JL3.5 FR DIAG (CATHETERS) ×1 IMPLANT
DEVICE RAD COMP TR BAND LRG (VASCULAR PRODUCTS) ×1 IMPLANT
GLIDESHEATH SLEND SS 6F .021 (SHEATH) ×1 IMPLANT
GUIDEWIRE INQWIRE 1.5J.035X260 (WIRE) IMPLANT
INQWIRE 1.5J .035X260CM (WIRE) ×2
KIT HEART LEFT (KITS) ×3 IMPLANT
PACK CARDIAC CATHETERIZATION (CUSTOM PROCEDURE TRAY) ×3 IMPLANT
SHEATH GLIDE SLENDER 4/5FR (SHEATH) IMPLANT
TRANSDUCER W/STOPCOCK (MISCELLANEOUS) ×3 IMPLANT
TUBING CIL FLEX 10 FLL-RA (TUBING) ×2 IMPLANT

## 2021-05-23 NOTE — Progress Notes (Signed)
Putting all of the relevant information together to include CT scan, PET/CT and biopsy results the patient has non-small cell carcinoma of the lung. ? 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 ? ?Plan: Patient has been referred to radiation oncology for SBRT. ?

## 2021-05-23 NOTE — Progress Notes (Signed)
? ?Electrophysiology Rounding Note ? ?Patient Name: Antonio [name] ?Date of Encounter: 05/23/2021 ? ?Primary Cardiologist: None ?Electrophysiologist: Dr. Caryl Comes ? ? ?Subjective  ? ?The patient is doing well today.  At this time, the patient denies chest pain, shortness of breath, or any new concerns. ? ?Inpatient Medications  ?  ?Scheduled Meds: ? aspirin  81 mg Oral q AM  ? atorvastatin  20 mg Oral Daily  ? ferrous gluconate  324 mg Oral Q breakfast  ? finasteride  5 mg Oral Daily  ? fluticasone furoate-vilanterol  1 puff Inhalation Daily  ? And  ? umeclidinium bromide  1 puff Inhalation Daily  ? insulin aspart  0-15 Units Subcutaneous TID WC  ? pantoprazole  40 mg Oral Daily  ? sodium chloride flush  3 mL Intravenous Q12H  ? tamsulosin  0.4 mg Oral BID  ? ?Continuous Infusions: ? sodium chloride    ? [START ON 05/24/2021] sodium chloride 3 mL/kg/hr (05/23/21 0938)  ? Followed by  ? [START ON 05/24/2021] sodium chloride 1 mL/kg/hr (05/23/21 1829)  ? ?PRN Meds: ?sodium chloride, acetaminophen, nitroGLYCERIN, ondansetron (ZOFRAN) IV, sodium chloride flush  ? ?Vital Signs  ?  ?Vitals:  ? 05/22/21 1150 05/22/21 1516 05/22/21 2102 05/23/21 0533  ?BP: (!) 152/62 (!) 165/78 (!) 165/75 (!) 147/56  ?Pulse: (!) 47 62 (!) 50 61  ?Resp: 17 19 17 19   ?Temp: 98.5 ?F (36.9 ?C) 98.8 ?F (37.1 ?C) 98.5 ?F (36.9 ?C) 98.5 ?F (36.9 ?C)  ?TempSrc: Oral Oral Oral Oral  ?SpO2:  96% 92% 94%  ?Weight:    69.4 kg  ?Height:      ? ? ?Intake/Output Summary (Last 24 hours) at 05/23/2021 0849 ?Last data filed at 05/23/2021 0442 ?Gross per 24 hour  ?Intake 600 ml  ?Output 1000 ml  ?Net -400 ml  ? ?Filed Weights  ? 05/21/21 0456 05/22/21 0430 05/23/21 0533  ?Weight: 71.2 kg 71 kg 69.4 kg  ? ? ?Physical Exam  ?  ?GEN- The patient is well appearing, alert and oriented x 3 today.   ?Head- normocephalic, atraumatic ?Eyes-  Sclera clear, conjunctiva pink ?Ears- hearing intact ?Oropharynx- clear ?Neck- supple ?Lungs- Clear to ausculation bilaterally, normal  work of breathing ?Heart- Regular rate and rhythm, no murmurs, rubs or gallops ?GI- soft, NT, ND, + BS ?Extremities- no clubbing or cyanosis. No edema ?Skin- no rash or lesion ?Psych- euthymic mood, full affect ?Neuro- strength and sensation are intact ? ?Labs  ?  ?CBC ?Recent Labs  ?  05/20/21 ?1413  ?WBC 10.3  ?HGB 11.8*  ?HCT 36.4*  ?MCV 95.0  ?PLT 223  ? ?Basic Metabolic Panel ?Recent Labs  ?  05/20/21 ?1413 05/21/21 ?0415  ?NA 138 138  ?K 3.8 4.2  ?CL 106 106  ?CO2 26 26  ?GLUCOSE 163* 207*  ?BUN 20 18  ?CREATININE 0.79 0.87  ?CALCIUM 9.0 9.3  ? ?Liver Function Tests ?No results for input(s): AST, ALT, ALKPHOS, BILITOT, PROT, ALBUMIN in the last 72 hours. ?No results for input(s): LIPASE, AMYLASE in the last 72 hours. ?Cardiac Enzymes ?No results for input(s): CKTOTAL, CKMB, CKMBINDEX, TROPONINI in the last 72 hours. ? ? ?Telemetry  ?  ?Sinus brady/NSR with intermittent Mobitz 1 40-60s (personally reviewed) ? ?Radiology  ?  ?CT CORONARY MORPH W/CTA COR W/SCORE W/CA W/CM &/OR WO/CM ? ?Addendum Date: 05/22/2021   ?ADDENDUM REPORT: 05/22/2021 22:47 HISTORY: Dyspnea on exertion (DOE) EXAM: Cardiac/Coronary  CT TECHNIQUE: The patient was scanned on a [employer].  PROTOCOL: A 120 kV prospective scan was triggered in the descending thoracic aorta at 111 HU's. Axial non-contrast 3 mm slices were carried out through the heart. The data set was analyzed on a dedicated work station and scored using the Agatston method. Gantry rotation speed was 250 msecs and collimation was .6 mm. Beta blockade and 0.8 mg of sl NTG was given. The 3D data set was reconstructed in 5% intervals of the 35-75 % of the R-R cycle. Systolic and diastolic phases were analyzed on a dedicated work station using MPR, MIP and VRT modes. The patient received 133mL OMNIPAQUE IOHEXOL 350 MG/ML SOLN contrast. FINDINGS: Image quality: Fair Noise artifact is: Limited Coronary calcium score is 1524, which places the patient in the 84th percentile  for age and sex matched control. Coronary arteries: Normal coronary origins.  Right dominance. Right Coronary Artery: Mild mixed atherosclerotic plaque in the proximal and mid RCA, 25-49% stenosis. Moderate mixed atherosclerotic plaque in the distal RCA, 50-69% stenosis, multiple serial lesions. Moderate mixed atherosclerotic plaque in the proximal RPLA, 50-69% stenosis. Possible severe stenosis in the mid RLPA 70-99% stenosis - vessel is approximately 2.5 mm in diameter just proximal to the stenosis. Left Main Coronary Artery: No detectable plaque or stenosis. Left Anterior Descending Coronary Artery: Mild mixed atherosclerotic plaque in the proximal LAD, 25-49% stenosis. Possible severe stenosis in the proximal second diagonal branch, 70-99% stenosis - vessel is approximately 2 mm just proximal to the stenosis. Moderate mixed plaque in the mid-distal LAD 50-69% stenosis. Mild mixed plaque in the distal LAD, 25-49% stenosis. Left Circumflex Artery: Severe calcified plaque in the proximal, high takeoff, large caliber OM1, 70-99% stenosis. Severe mixed plaque in the L circumflex just distal to this bifurcation - tubular, severe stenosis 70-99%. Immediately distal there is a severe stenosis in the distal L circumflex artery 70-99% stenosis, where the vessel appears to possibly be occluded vs small channel of flow. Vessel is small caliber <2 mm in this location. Aorta: Normal size, 36 mm at the mid ascending aorta (level of the PA bifurcation) measured double oblique. Moderate aortic atherosclerosis. No dissection. Aortic Valve: trivial calcifications. Other findings: Normal pulmonary vein drainage into the left atrium. LA appendage and pulmonary artery are incompletely visualized on CT due to limited z axis coverage. IMPRESSION: 1. Severe CAD in the proximal OM1, L circumflex, RPLA, and proximal second diagonal, CADRADS = 4. CT FFR will be performed and reported separately. 2. Coronary calcium score is 1524, which  places the patient in the 84th percentile for age and sex matched control 3. Normal coronary origins with right dominance. Electronically Signed   By: Cherlynn Kaiser M.D.   On: 05/22/2021 22:47  ? ?Result Date: 05/22/2021 ?EXAM: OVER-READ INTERPRETATION  CT CHEST The following report is an over-read performed by radiologist Dr. Vinnie Langton of [facility], [place] on 05/22/2021. This over-read does not include interpretation of cardiac or coronary anatomy or pathology. The coronary calcium score/coronary CTA interpretation by the cardiologist is attached. COMPARISON:  Chest CT 05/09/2021. FINDINGS: Atherosclerotic calcifications in the thoracic aorta. Linear scarring or subsegmental atelectasis in the left lower lobe, new compared to the prior study. Within the visualized portions of the thorax there are no suspicious appearing pulmonary nodules or masses, there is no acute consolidative airspace disease, no pleural effusions, no pneumothorax and no lymphadenopathy. Visualized portions of the upper abdomen are unremarkable. There are no aggressive appearing lytic or blastic lesions noted in the visualized portions of the skeleton. IMPRESSION: 1. New linear  scarring or subsegmental atelectasis in the left lower lobe. 2. Aortic atherosclerosis. Electronically Signed: By: Vinnie Langton M.D. On: 05/22/2021 13:23  ? ?CT CORONARY FRACTIONAL FLOW RESERVE DATA PREP ? ?Result Date: 05/22/2021 ?EXAM: CT FFR ANALYSIS CLINICAL DATA:  abnormal cardiac CT FINDINGS: FFRct analysis was performed on the original cardiac CT angiogram dataset. Diagrammatic representation of the FFRct analysis is provided in a separate PDF document in PACS. This dictation was created using the PDF document and an interactive 3D model of the results. 3D model is not available in the EMR/PACS. Normal FFR range is >0.80. Indeterminate (grey) zone is 0.76-0.80. 1. Left Main: FFR = 0.97 2. LAD: Proximal FFR = 0.88, Mid FFR = 0.81, distal FFR = 0.77  3. LCX: Proximal FFR = 0.96, distal FFR = 0.67, OM1 prox = 0.82, distal 0.66 4. RCA: Proximal FFR = 0.96, mid FFR =0.89, Distal FFR = 0.86, RPLA 0.77 IMPRESSION: 1. CT FFR analysis showed significant steno

## 2021-05-23 NOTE — Progress Notes (Signed)
Discharge instructions, RX's and follow up appts explained and provided to patient and significant other verbalized understanding. Patient left floor via wheelchair accompanied by staff. No c/o pain or shortness of breath at d/c. ? ?Laporcha Marchesi, Tivis Ringer, RN ? ?

## 2021-05-23 NOTE — Plan of Care (Signed)
?  Problem: Education: ?Goal: Ability to verbalize understanding of medication therapies will improve ?Outcome: Progressing ?  ?Problem: Education: ?Goal: Knowledge of General Education information will improve ?Description: Including pain rating scale, medication(s)/side effects and non-pharmacologic comfort measures ?Outcome: Progressing ?  ?

## 2021-05-23 NOTE — Progress Notes (Signed)
?  05/23/21 1151  ?Assess: MEWS Score  ?Level of Consciousness Alert  ?Assess: MEWS Score  ?MEWS Temp 0  ?MEWS Systolic 0  ?MEWS Pulse 1  ?MEWS RR 0  ?MEWS LOC 0  ?MEWS Score 1  ?MEWS Score Color Green  ?Assess: if the MEWS score is Yellow or Red  ?Were vital signs taken at a resting state? Yes  ?Focused Assessment No change from prior assessment  ?Early Detection of Sepsis Score *See Row Information* Low  ?MEWS guidelines implemented *See Row Information* No, vital signs rechecked  ?Treat  ?MEWS Interventions Other (Comment) ?(MD aware patient normal heart rate is in the 40s)  ?Notify: Charge Nurse/RN  ?Name of Charge Nurse/RN Notified Tai RN  ?Date Charge Nurse/RN Notified 05/23/21  ?Time Charge Nurse/RN Notified 1151  ? ? ?

## 2021-05-23 NOTE — Plan of Care (Signed)
?  Problem: Education: ?Goal: Ability to verbalize understanding of medication therapies will improve ?05/23/2021 1557 by Emmaline Life, RN ?Outcome: Adequate for Discharge ?05/23/2021 1024 by Emmaline Life, RN ?Outcome: Progressing ?  ?Problem: Education: ?Goal: Knowledge of General Education information will improve ?Description: Including pain rating scale, medication(s)/side effects and non-pharmacologic comfort measures ?05/23/2021 1557 by Emmaline Life, RN ?Outcome: Adequate for Discharge ?05/23/2021 1024 by Emmaline Life, RN ?Outcome: Progressing ?  ?Problem: Health Behavior/Discharge Planning: ?Goal: Ability to manage health-related needs will improve ?Outcome: Adequate for Discharge ?  ?Problem: Clinical Measurements: ?Goal: Ability to maintain clinical measurements within normal limits will improve ?Outcome: Adequate for Discharge ?Goal: Will remain free from infection ?Outcome: Adequate for Discharge ?Goal: Diagnostic test results will improve ?Outcome: Adequate for Discharge ?Goal: Respiratory complications will improve ?Outcome: Adequate for Discharge ?Goal: Cardiovascular complication will be avoided ?Outcome: Adequate for Discharge ?  ?Problem: Activity: ?Goal: Risk for activity intolerance will decrease ?Outcome: Adequate for Discharge ?  ?Problem: Nutrition: ?Goal: Adequate nutrition will be maintained ?Outcome: Adequate for Discharge ?  ?Problem: Coping: ?Goal: Level of anxiety will decrease ?Outcome: Adequate for Discharge ?  ?Problem: Elimination: ?Goal: Will not experience complications related to bowel motility ?Outcome: Adequate for Discharge ?Goal: Will not experience complications related to urinary retention ?Outcome: Adequate for Discharge ?  ?Problem: Pain Managment: ?Goal: General experience of comfort will improve ?Outcome: Adequate for Discharge ?  ?Problem: Safety: ?Goal: Ability to remain free from injury will improve ?Outcome: Adequate for Discharge ?  ?Problem: Skin  Integrity: ?Goal: Risk for impaired skin integrity will decrease ?Outcome: Adequate for Discharge ?  ?

## 2021-05-23 NOTE — Consult Note (Signed)
? ?  [facility] CM Inpatient Consult ? ? ?05/23/2021 ? ?Antonio [name] ?March 18, 1945 ?[id number] ? ?[place] Organization [ACO] Patient: Medicare ACO [place] ? ?Primary Care Provider:  Pleas Koch, NP, [facility], is an embedded provider with a Chronic Care Management team and program, and is listed for the transition of care follow up and appointments. ? ?Patient was screened for Embedded practice service needs for chronic care management and met with patient to make aware of services available at PCP office.  Patient denies any issues at this time.  Patient was given an appointment reminder card for PCP and a 24 hour nurse advise line magnet. ? ?Plan: Continue to follow for [facility] needs for post hospital care. ? ?Please contact for further questions, ? ?Natividad Brood, RN BSN CCM ?[facility] Liaison ? [phone] business mobile phone ?Toll free office [phone]  ?Fax number: [phone] ?[email or web address] ? ? ? ?

## 2021-05-23 NOTE — Interval H&P Note (Deleted)
History and Physical Interval Note: ? ?05/23/2021 ?7:05 AM ? ?Antonio [name]  has presented today for surgery, with the diagnosis of unstable angina.  The various methods of treatment have been discussed with the patient and family. After consideration of risks, benefits and other options for treatment, the patient has consented to  Procedure(s): ?LEFT HEART CATH AND CORONARY ANGIOGRAPHY (N/A) as a surgical intervention.  The patient's history has been reviewed, patient examined, no change in status, stable for surgery.  I have reviewed the patient's chart and labs.  Questions were answered to the patient's satisfaction.   ? ? ?[name] ? ? ?

## 2021-05-23 NOTE — Interval H&P Note (Signed)
History and Physical Interval Note: ? ?05/23/2021 ?7:06 AM ? ?Antonio [name]  has presented today for surgery, with the diagnosis of unstable angina.  The various methods of treatment have been discussed with the patient and family. After consideration of risks, benefits and other options for treatment, the patient has consented to  Procedure(s): ?LEFT HEART CATH AND CORONARY ANGIOGRAPHY (N/A) as a surgical intervention.  The patient's history has been reviewed, patient examined, no change in status, stable for surgery.  I have reviewed the patient's chart and labs.  Questions were answered to the patient's satisfaction.   ? ?Cath Lab Visit (complete for each Cath Lab visit) ? ?Clinical Evaluation Leading to the Procedure:  ? ?ACS: No. ? ?Non-ACS:   ? ?Anginal Classification: CCS II ? ?Anti-ischemic medical therapy: Minimal Therapy (1 class of medications) ? ?Non-Invasive Test Results: No non-invasive testing performed ? ?Prior CABG: No previous CABG ? ? ? ? ? ? ? ?Antonio [name] ? ? ?

## 2021-05-23 NOTE — Anesthesia Postprocedure Evaluation (Signed)
Anesthesia Post Note ? ?Patient: ORION MOLE ? ?Procedure(s) Performed: ROBOTIC ASSISTED NAVIGATIONAL BRONCHOSCOPY, WITH CELLVIZIO (Left) ? ?Patient location during evaluation: PACU ?Anesthesia Type: General ?Level of consciousness: awake and alert ?Pain management: pain level controlled ?Vital Signs Assessment: post-procedure vital signs reviewed and stable ?Respiratory status: spontaneous breathing, nonlabored ventilation and respiratory function stable ?Cardiovascular status: blood pressure returned to baseline and stable ?Postop Assessment: no apparent nausea or vomiting ?Anesthetic complications: no ? ? ?No notable events documented. ? ? ?Last Vitals:  ?Vitals:  ? 05/18/21 1500 05/18/21 1512  ?BP: 127/66 126/71  ?Pulse: 73 86  ?Resp: 13 16  ?Temp: 36.4 ?C 36.4 ?C  ?SpO2: 92% 95%  ?  ?Last Pain:  ?Vitals:  ? 05/18/21 1512  ?TempSrc: Temporal  ?PainSc: 0-No pain  ? ? ?  ?  ?  ?  ?  ?  ? ?[place] Ouch ? ? ? ? ?

## 2021-05-23 NOTE — Discharge Summary (Signed)
? ? ?ELECTROPHYSIOLOGY DISCHARGE SUMMARY  ? ? ?Patient ID: Antonio [name],  ?MRN: [id number], DOB/AGE: Aug 18, 1945 76 y.o. ? ?Admit date: 05/20/2021 ?Discharge date: 05/23/2021 ? ?Primary Care Physician: Pleas Koch, NP  ?Primary Cardiologist: None  ?Electrophysiologist: Dr. Caryl Comes ? 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 ?Secondary Discharge Diagnosis:  ?Lung CA- non-small cell ?PVD ?CAD  ? ?Procedures This Admission:  ?Echo 05/20/2021 LVEF 60-65% ? ?CT Cor 4/16/20223 ?1. Severe CAD in the proximal OM1, L circumflex, RPLA, and proximal ?second diagonal, CADRADS = 4. CT FFR will be performed and reported ?separately. ?2. Coronary calcium score is 1524, which places the patient in the ?84th percentile for age and sex matched control ?3. Normal coronary origins with right dominance. ? ?LHC 05/23/2021 ?  Dist RCA lesion is 20% stenosed. ?  1st Mrg lesion is 50% stenosed. ?  LV end diastolic pressure is normal. ?  ?1.  Mild to moderate diffuse disease as detailed above. ?2.  LVEDP of 7 mmHg. ? ?Brief HPI: ?Antonio [name] is a 76 y.o. male with a history of dyspnea, who presented for outpatient stress test and was admitted for bradycardia in the 30s.  ? ?Hospital Course:  ?The patient was admitted as above. ECG demonstrated Wenckebach and 1st degree AV block.  ECG 4/7 ? Sinus exit block in addition to bradycardia and 1AVB. Stress test images also consistent with [facility] with grouped beating.  He was felt to have chronic bradycardia and no current indication for pacing. Had good HR excursion through weekend.   With DOE, concern for anginal equivalent arose with testing as above. Ultimately cath showed Non-obstructive CAD.  They were monitored on telemetry overnight which demonstrated sinus bradycardia with Mobitz 1.   The patient was examined and considered to be stable for discharge.  Wound care and restrictions were reviewed with the patient.  The  patient will be seen back by  Dr. Caryl Comes in 2-3 weeks for post hospital care.  ? ?Physical Exam: ?Vitals:  ? 05/23/21 1150 05/23/21 1200 05/23/21 1215 05/23/21 1230  ?BP: (!) 147/69 (!) 138/55 128/71 (!) 145/62  ?Pulse: (!) 46 (!) 50 (!) 35 (!) 43  ?Resp: 17 15 12 14   ?Temp: 98 ?F (36.7 ?C)     ?TempSrc: Oral     ?SpO2: 95% 94% 97% 96%  ?Weight:      ?Height:      ? ? ?GEN- The patient is well appearing, alert and oriented x 3 today.   ?HEENT: normocephalic, atraumatic; sclera clear, conjunctiva pink; hearing intact; oropharynx clear; neck supple  ?Lungs- Clear to ausculation bilaterally, normal work of breathing.  No wheezes, rales, rhonchi ?Heart- Regular rate and rhythm, no murmurs, rubs or gallops  ?GI- soft, non-tender, non-distended, bowel sounds present  ?Extremities- no clubbing, cyanosis, or edema; DP/PT/radial pulses 2+ bilaterally, groin without hematoma/bruit ?MS- no significant deformity or atrophy ?Skin- warm and dry, no rash or lesion ?Psych- euthymic mood, full affect ?Neuro- strength and sensation are intact ? ? ?Labs: ?  ?Lab Results  ?Component Value Date  ? WBC 10.3 05/20/2021  ? HGB 11.8 (L) 05/20/2021  ? HCT 36.4 (L) 05/20/2021  ? MCV 95.0 05/20/2021  ? PLT 223 05/20/2021  ?  ?Recent Labs  ?Lab 05/21/21 ?0415  ?NA 138  ?K 4.2  ?CL 106  ?CO2 26  ?BUN 18  ?CREATININE 0.87  ?CALCIUM 9.3  ?GLUCOSE 207*  ? ? ? ?Discharge Medications:  ?  Allergies as of 05/23/2021   ?No Known Allergies ?  ? ?  ?Medication List  ?  ? ?STOP taking these medications   ? ?amLODipine 10 MG tablet ?Commonly known as: NORVASC ?  ? ?  ? ?TAKE these medications   ? ?acetaminophen 500 MG tablet ?Commonly known as: TYLENOL ?Take 1,000 mg by mouth every 6 (six) hours as needed (hip pain.). Rapid release ?  ?aspirin 81 MG EC tablet ?Take 81 mg by mouth in the morning. ?  ?atorvastatin 20 MG tablet ?Commonly known as: LIPITOR ?TAKE 1 TABLET BY MOUTH DAILY IN THE EVENING FOR CHOLESTEROL. Office visit required for further  refills. ?What changed:  ?how much to take ?how to take this ?when to take this ?additional instructions ?  ?clopidogrel 75 MG tablet ?Commonly known as: PLAVIX ?Take 1 tablet (75 mg total) by mouth daily. ?  ?ferrous gluconate 324 MG tablet ?Commonly known as: FERGON ?TAKE 1 TABLET BY MOUTH EVERY DAY WITH BREAKFAST ?What changed: See the new instructions. ?  ?FIBER PO ?Take 1 capsule by mouth in the morning. ?  ?finasteride 5 MG tablet ?Commonly known as: PROSCAR ?TAKE 1 TABLET BY MOUTH EVERY DAY ?  ?glipiZIDE 10 MG tablet ?Commonly known as: GLUCOTROL ?Take 10 mg by mouth daily before supper. ?  ?GLUCOSAMINE HCL PO ?Take 1 tablet by mouth daily. ?  ?insulin detemir 100 UNIT/ML injection ?Commonly known as: Levemir ?Inject 20 units at bedtime. ?What changed:  ?how much to take ?how to take this ?when to take this ?additional instructions ?  ?lisinopril-hydrochlorothiazide 20-25 MG tablet ?Commonly known as: ZESTORETIC ?Take 1 tablet by mouth daily. For blood pressure. ?  ?metFORMIN 500 MG tablet ?Commonly known as: GLUCOPHAGE ?TAKE 2 TABLETS BY MOUTH TWICE A DAY WITH MEAL FOR DIABETES ?What changed:  ?how much to take ?how to take this ?when to take this ?additional instructions ?  ?pantoprazole 40 MG tablet ?Commonly known as: PROTONIX ?Take 1 tablet (40 mg total) by mouth daily. For heartburn ?  ?sildenafil 100 MG tablet ?Commonly known as: Viagra ?Take 1 tablet (100 mg total) by mouth daily as needed for erectile dysfunction. ?  ?tamsulosin 0.4 MG Caps capsule ?Commonly known as: FLOMAX ?TAKE 1 CAPSULE BY MOUTH 2 TIMES DAILY. ?  ?Trelegy Ellipta 100-62.5-25 MCG/ACT Aepb ?Generic drug: Fluticasone-Umeclidin-Vilant ?Inhale 100 mcg into the lungs daily. ?  ?vitamin B-12 1000 MCG tablet ?Commonly known as: CYANOCOBALAMIN ?Take 1,000 mcg by mouth in the morning. ?  ? ?  ? ? ?Disposition:  ? ? Follow-up Information   ? ? Deboraha Sprang, MD Follow up.   ?Specialty: Cardiology ?Why: on 5/23 at 0940 for post hospital  follow up ?Contact information: ?[address] ?[address] ?[place] [address] ?[phone] ? ? ?  ?  ? ? Rise Mu, PA-C Follow up.   ?Specialties: Physician Assistant, Cardiology, Radiology ?Why: on 5/4 at 1030 am for post hospital follow up ?Contact information: ?[address] ?[address] ?[place] [address] ?[phone] ? ? ?  ?  ? ?  ?  ? ?  ? ? ?Duration of Discharge Encounter: Greater than 30 minutes including physician time. ? ?Signed, ?Shirley Friar, PA-C  ?05/23/2021 ?1:11 PM ? ? ? ?

## 2021-05-24 MED FILL — Heparin Sod (Porcine)-NaCl IV Soln 1000 Unit/500ML-0.9%: INTRAVENOUS | Qty: 1000 | Status: AC

## 2021-05-25 ENCOUNTER — Encounter (INDEPENDENT_AMBULATORY_CARE_PROVIDER_SITE_OTHER): Payer: Self-pay | Admitting: *Deleted

## 2021-05-25 ENCOUNTER — Ambulatory Visit (INDEPENDENT_AMBULATORY_CARE_PROVIDER_SITE_OTHER): Payer: Medicare Other | Admitting: Primary Care

## 2021-05-25 ENCOUNTER — Encounter: Payer: Self-pay | Admitting: Primary Care

## 2021-05-25 ENCOUNTER — Telehealth: Payer: Self-pay | Admitting: Cardiology

## 2021-05-25 DIAGNOSIS — I10 Essential (primary) hypertension: Secondary | ICD-10-CM

## 2021-05-25 DIAGNOSIS — I2510 Atherosclerotic heart disease of native coronary artery without angina pectoris: Secondary | ICD-10-CM

## 2021-05-25 DIAGNOSIS — I441 Atrioventricular block, second degree: Secondary | ICD-10-CM

## 2021-05-25 LAB — CYTOLOGY - NON PAP

## 2021-05-25 NOTE — Assessment & Plan Note (Signed)
Controlled. ? ?Remain off of amlodipine 10 mg, this was discontinued during his recent hospitalization. ? ?Continue lisinopril-hydrochlorothiazide 20-25 mg daily. ?

## 2021-05-25 NOTE — Progress Notes (Signed)
? ?Subjective:  ? ? Patient ID: Antonio [name], male    DOB: [birth year], 76 y.o.   MRN: [id number] ? ?HPI ? ?Antonio [name] is a very pleasant 76 y.o. male who presents today  ? ?He presented to [facility] ED from [facility] on 05/20/2021 for bradycardia per cardiology.  He was undergoing stress test for preoperative clearance and was found to be in complete heart block with a rate of 39.  He was admitted for further evaluation. ? ?During his hospital stay telemetry revealed bradycardia with A-V dissociation, Wenckebach sequence.  He underwent echocardiogram which showed normal LVEF, normal right ventricular function, no significant valvular abnormalities.  Evaluated by electrophysiology and cardiology who recommended left heart catheterization which revealed mild to moderate diffuse disease. No stenting.  Initially a pacemaker was discussed but it was decided to forego the pacemaker at this time.  ? ?He was discharged home on 05/23/2021 with recommendations to stop amlodipine, start lisinopril-hydrochlorothiazide 20-25 mg, for cardiology and electrophysiology follow-up. ? ?Since his discharge home he is no longer taking amlodipine and metoprolol. He is compliant to lisinopril-HCTZ 20-25 mg, clopidogrel, and aspirin. He's feeling better overall. He is cognizant about rising from seated and lying positions slowly. He denies falls.  ? ?He will begin radiation treatment soon for "suspicious" nodules to the lung. Following with pulmonology. ? ? ? ?Review of Systems  ?Respiratory:  Negative for shortness of breath.   ?Cardiovascular:  Negative for chest pain.  ?Neurological:  Negative for dizziness.  ? ?   ? ? ?Past Medical History:  ?Diagnosis Date  ? Anemia   ? Arthritis   ? Asthma   ? age 47  ? BPH with urinary obstruction   ? stable on flomax ([name]  ? Coronary atherosclerosis of unspecified type of vessel, native or graft   ? 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 Emphysema of lung ([place])   ? Esophageal stricture    ? Gastritis   ? GERD (gastroesophageal reflux disease)   ? Hiatal hernia   ? Hx of colonic polyps   ? Hyperlipidemia   ? Hypertension   ? Internal hemorrhoids without mention of complication   ? Lumbago   ? Pain in both lower legs   ? Pneumonia   ? PSA elevation   ? now averaging 2's  ? Tobacco abuse   ? URI (upper respiratory infection)   ? Urinary retention   ? ? ?Social History  ? ?Socioeconomic History  ? Marital status: Widowed  ?  Spouse name: [name]  ? Number of children: 3  ? Years of education: Not on file  ? Highest education level: Not on file  ?Occupational History  ? Occupation: Retired Games developer  ?Tobacco Use  ? Smoking status: Every Day  ?  Packs/day: 2.00  ?  Years: 57.00  ?  Pack years: 114.00  ?  Types: Cigarettes  ? Smokeless tobacco: Former  ?  Types: Chew  ? Tobacco comments:  ?  0.5 PPD since 04/20/2021  ?Vaping Use  ? Vaping Use: Never used  ?Substance and Sexual Activity  ? Alcohol use: Not Currently  ?  Alcohol/week: 0.0 standard drinks  ?  Comment: rare  ? Drug use: No  ? Sexual activity: Yes  ?Other Topics Concern  ? Not on file  ?Social History Narrative  ? Married 9/09 wife with moderate memory problems.  ? 3 adult children, 2 grandchildren  ? Would desire CPR  ? ?Social Determinants  of Health  ? ?Financial Resource Strain: Low Risk   ? Difficulty of Paying Living Expenses: Not very hard  ?Food Insecurity: No Food Insecurity  ? Worried About Charity fundraiser in the Last Year: Never true  ? Ran Out of Food in the Last Year: Never true  ?Transportation Needs: No Transportation Needs  ? Lack of Transportation (Medical): No  ? Lack of Transportation (Non-Medical): No  ?Physical Activity: Inactive  ? Days of Exercise per Week: 0 days  ? Minutes of Exercise per Session: 0 min  ?Stress: No Stress Concern Present  ? Feeling of Stress : Not at all  ?Social Connections: Not on file  ?Intimate Partner Violence: Not At Risk  ? Fear of Current or Ex-Partner: No  ? Emotionally Abused: No  ?  Physically Abused: No  ? Sexually Abused: No  ? ? ?Past Surgical History:  ?Procedure Laterality Date  ? 2D Echo  06/03  ? Abdominal ultrasound  06/03  ? Negative  ? BACK SURGERY    ? COLONOSCOPY    ? CT of head  and sinuses  06/03  ? Negative  ? CT of the chest, abdomen, pelvis  06/03  ? Chest negative; Abdomen, left adrenal lesion; Pelvis, enlarged prostate  ? ESOPHAGOGASTRODUODENOSCOPY  02/13/07  ? gastritis and duodenitis without bleed  ? ESOPHAGOGASTRODUODENOSCOPY N/A 06/26/2014  ? Procedure: ESOPHAGOGASTRODUODENOSCOPY (EGD);  Surgeon: Milus Banister, MD;  Location: [facility];  Service: Endoscopy;  Laterality: N/A;  ? ESOPHAGOGASTRODUODENOSCOPY (EGD) WITH PROPOFOL N/A 04/08/2020  ? Procedure: ESOPHAGOGASTRODUODENOSCOPY (EGD) WITH PROPOFOL;  Surgeon: Ladene Artist, MD;  [facility]: [facility];  Service: Endoscopy;  Laterality: N/A;  ? HOT HEMOSTASIS N/A 04/08/2020  ? Procedure: HOT HEMOSTASIS (ARGON PLASMA COAGULATION/BICAP);  Surgeon: Ladene Artist, MD;  Location: [facility];  Service: Endoscopy;  Laterality: N/A;  ? KNEE ARTHROSCOPY  10/00  ? Right  ? LEFT HEART CATH AND CORONARY ANGIOGRAPHY N/A 05/23/2021  ? Procedure: LEFT HEART CATH AND CORONARY ANGIOGRAPHY;  Surgeon: [name], MD;  Location: [facility];  Service: Cardiovascular;  Laterality: N/A;  ? LOWER EXTREMITY ANGIOGRAPHY Right 12/27/2017  ? Procedure: LOWER EXTREMITY ANGIOGRAPHY;  Surgeon: Algernon Huxley, MD;  Location: [facility];  Service: Cardiovascular;  Laterality: Right;  ? LOWER EXTREMITY ANGIOGRAPHY Left 01/10/2018  ? Procedure: LOWER EXTREMITY ANGIOGRAPHY;  Surgeon: Algernon Huxley, MD;  Location: [facility];  Service: Cardiovascular;  Laterality: Left;  ? LOWER EXTREMITY ANGIOGRAPHY Left 05/29/2018  ? Procedure: LOWER EXTREMITY ANGIOGRAPHY;  Surgeon: Algernon Huxley, MD;  Location: [facility];  Service: Cardiovascular;  Laterality: Left;  ? LOWER EXTREMITY ANGIOGRAPHY Left 05/30/2018  ? Procedure:  Lower Extremity Angiography;  Surgeon: Algernon Huxley, MD;  Location: [facility];  Service: Cardiovascular;  Laterality: Left;  ? LP  06/03  ? Microwave thermotherapy prostate  08/28/07  ? Rogers Blocker  ? Persantine cardiolite  03/01  ? EF 68%  ? POLYPECTOMY  11/95  ? Stress myoview  07/06  ? EF 67%  ? UPPER GASTROINTESTINAL ENDOSCOPY    ? ? ?Family History  ?Problem Relation Age of Onset  ? Diabetes Father   ? Alcohol abuse Paternal Uncle   ? Alcohol abuse Paternal Uncle   ? Alcohol abuse Paternal Uncle   ? Rheum arthritis Mother   ? Heart disease Neg Hx   ? Stroke Neg Hx   ? Cancer Neg Hx   ? Drug abuse Neg  Hx   ? Depression Neg Hx   ? Colon cancer Neg Hx   ? Rectal cancer Neg Hx   ? Stomach cancer Neg Hx   ? Kidney cancer Neg Hx   ? Bladder Cancer Neg Hx   ? Prostate cancer Neg Hx   ? ? ?No Known Allergies ? ?Current Outpatient Medications on File Prior to Visit  ?Medication Sig Dispense Refill  ? acetaminophen (TYLENOL) 500 MG tablet Take 1,000 mg by mouth every 6 (six) hours as needed (hip pain.). Rapid release    ? aspirin 81 MG EC tablet Take 81 mg by mouth in the morning.    ? atorvastatin (LIPITOR) 20 MG tablet TAKE 1 TABLET BY MOUTH DAILY IN THE EVENING FOR CHOLESTEROL. Office visit required for further refills. (Patient taking differently: Take 20 mg by mouth daily.) 90 tablet 0  ? clopidogrel (PLAVIX) 75 MG tablet Take 1 tablet (75 mg total) by mouth daily. 30 tablet 6  ? ferrous gluconate (FERGON) 324 MG tablet TAKE 1 TABLET BY MOUTH EVERY DAY WITH BREAKFAST (Patient taking differently: Take 324 mg by mouth daily with breakfast.) 30 tablet 0  ? FIBER PO Take 1 capsule by mouth in the morning.    ? finasteride (PROSCAR) 5 MG tablet TAKE 1 TABLET BY MOUTH EVERY DAY (Patient taking differently: Take 5 mg by mouth daily.) 90 tablet 3  ? Fluticasone-Umeclidin-Vilant (TRELEGY ELLIPTA) 100-62.5-25 MCG/ACT AEPB Inhale 100 mcg into the lungs daily. 28 each 0  ? glipiZIDE (GLUCOTROL) 10 MG tablet Take 10 mg by  mouth daily before supper.     ? GLUCOSAMINE HCL PO Take 1 tablet by mouth daily.    ? insulin detemir (LEVEMIR) 100 UNIT/ML injection Inject 20 units at bedtime. (Patient taking differently: Inject 15 Units into t

## 2021-05-25 NOTE — Patient Instructions (Signed)
It was a pleasure to see you today!   

## 2021-05-25 NOTE — Telephone Encounter (Signed)
Pt's spouse states that pt was recently released from the hospital and would like to know if ECHO appt is still necessary being that pt had an Ultrasound, CT, and Cardiac Cath done while there. Please advise ?

## 2021-05-25 NOTE — Assessment & Plan Note (Signed)
Recent hospitalization.  Notes, labs, procedures, imaging reviewed. ? ?Remain off of amlodipine. ?Continue lisinopril-hydrochlorothiazide. ? ?He will follow-up with cardiology as scheduled. ? ?He appears well today. ?

## 2021-05-25 NOTE — Telephone Encounter (Signed)
Looks like the patient had an echo on 05/20/21 @ [facility]. ?Message fwd to Dr. Thereasa Solo nurse Ralene Muskrat, RN to review and advise. ?

## 2021-05-26 ENCOUNTER — Institutional Professional Consult (permissible substitution): Payer: Medicare Other | Admitting: Radiation Oncology

## 2021-05-26 NOTE — Telephone Encounter (Signed)
Called and spoke with patients wife. I informed her that we could go ahead and cancel the Echocardiogram as the patient had one done while in the hospital. She was very appreciative for the follow up. ?

## 2021-05-28 ENCOUNTER — Other Ambulatory Visit: Payer: Self-pay | Admitting: Gastroenterology

## 2021-05-28 DIAGNOSIS — K2990 Gastroduodenitis, unspecified, without bleeding: Secondary | ICD-10-CM

## 2021-05-30 ENCOUNTER — Other Ambulatory Visit: Payer: Medicare Other

## 2021-06-01 ENCOUNTER — Encounter: Payer: Self-pay | Admitting: Radiation Oncology

## 2021-06-01 ENCOUNTER — Ambulatory Visit
Admission: RE | Admit: 2021-06-01 | Discharge: 2021-06-01 | Disposition: A | Discharge status: Home / Self Care | Payer: Medicare Other | Source: Ambulatory Visit | Attending: Radiation Oncology | Admitting: Radiation Oncology

## 2021-06-01 VITALS — BP 151/65 | HR 47 | Temp 98.5°F | Resp 16 | Ht 66.0 in | Wt 163.1 lb

## 2021-06-01 DIAGNOSIS — E785 Hyperlipidemia, unspecified: Secondary | ICD-10-CM | POA: Diagnosis not present

## 2021-06-01 DIAGNOSIS — Z794 Long term (current) use of insulin: Secondary | ICD-10-CM | POA: Diagnosis not present

## 2021-06-01 DIAGNOSIS — J45909 Unspecified asthma, uncomplicated: Secondary | ICD-10-CM | POA: Diagnosis not present

## 2021-06-01 DIAGNOSIS — E119 Type 2 diabetes mellitus without complications: Secondary | ICD-10-CM | POA: Insufficient documentation

## 2021-06-01 DIAGNOSIS — K219 Gastro-esophageal reflux disease without esophagitis: Secondary | ICD-10-CM | POA: Insufficient documentation

## 2021-06-01 DIAGNOSIS — Z87891 Personal history of nicotine dependence: Secondary | ICD-10-CM | POA: Diagnosis not present

## 2021-06-01 DIAGNOSIS — Z79899 Other long term (current) drug therapy: Secondary | ICD-10-CM | POA: Insufficient documentation

## 2021-06-01 DIAGNOSIS — I2510 Atherosclerotic heart disease of native coronary artery without angina pectoris: Secondary | ICD-10-CM | POA: Insufficient documentation

## 2021-06-01 DIAGNOSIS — Z7984 Long term (current) use of oral hypoglycemic drugs: Secondary | ICD-10-CM | POA: Insufficient documentation

## 2021-06-01 DIAGNOSIS — J439 Emphysema, unspecified: Secondary | ICD-10-CM | POA: Diagnosis not present

## 2021-06-01 DIAGNOSIS — D649 Anemia, unspecified: Secondary | ICD-10-CM | POA: Diagnosis not present

## 2021-06-01 DIAGNOSIS — I10 Essential (primary) hypertension: Secondary | ICD-10-CM | POA: Diagnosis not present

## 2021-06-01 DIAGNOSIS — C3412 Malignant neoplasm of upper lobe, left bronchus or lung: Secondary | ICD-10-CM | POA: Diagnosis not present

## 2021-06-01 DIAGNOSIS — R911 Solitary pulmonary nodule: Secondary | ICD-10-CM

## 2021-06-01 NOTE — Consult Note (Signed)
?NEW PATIENT EVALUATION ? ?Name: Antonio [name]  ?MRN: [id number]  ?Date:   06/01/2021     ?DOB: 05/02/1945 ? ? ?This 76 y.o. male patient presents to the clinic for initial evaluation of stage I non-small cell lung cancer of the left upper lobe. ? ?REFERRING PHYSICIAN: Pleas Koch, NP ? ?CHIEF COMPLAINT:  ?Chief Complaint  ?Patient presents with  ? Lung Cancer  ? ? ?DIAGNOSIS: The encounter diagnosis was Lung nodule. ?  ?PREVIOUS INVESTIGATIONS:  ?CT scans and PET CT scans reviewed ?Clinical notes reviewed ?Cytology reports reviewed ? ?HPI: Patient is a 76 year old male who presented at CT screening with a lung RADS 4B suspicious lesion in the perihilar left upper lobe measuring 0.6 x 1.1 cm.  PET scan demonstrated hypermetabolic activity in the left upper lobe pulmonary nodule concerning for primary bronchogenic carcinoma.  No other evidence of adenopathy in the mediastinum or distant disease was noted.  Patient was seen by Dr. Patsey Berthold taking 2 bronchoscopy with cytology fine-needle aspirate showed atypical cells suspicious for non-small cell carcinoma.  He was recently treated for sinus bradycardia with Mobitz 1 second-degree AV block.  He is seen today for consideration of SBRT.  He specifically [place] cough hemoptysis chest tightness. ? ?PLANNED TREATMENT REGIMEN: SBRT ? ?PAST MEDICAL HISTORY:  has a past medical history of Anemia, Arthritis, Asthma, BPH with urinary obstruction, Coronary atherosclerosis of unspecified type of vessel, native or graft, Diabetes mellitus, Emphysema of lung ([place]), Esophageal stricture, Gastritis, GERD (gastroesophageal reflux disease), Hiatal hernia, colonic polyps, Hyperlipidemia, Hypertension, Internal hemorrhoids without mention of complication, Lumbago, Pain in both lower legs, Pneumonia, PSA elevation, Tobacco abuse, URI (upper respiratory infection), and Urinary retention.   ? ?PAST SURGICAL HISTORY:  ?Past Surgical History:  ?Procedure Laterality Date  ? 2D Echo  06/03   ? Abdominal ultrasound  06/03  ? Negative  ? BACK SURGERY    ? COLONOSCOPY    ? CT of head  and sinuses  06/03  ? Negative  ? CT of the chest, abdomen, pelvis  06/03  ? Chest negative; Abdomen, left adrenal lesion; Pelvis, enlarged prostate  ? ESOPHAGOGASTRODUODENOSCOPY  02/13/07  ? gastritis and duodenitis without bleed  ? ESOPHAGOGASTRODUODENOSCOPY N/A 06/26/2014  ? Procedure: ESOPHAGOGASTRODUODENOSCOPY (EGD);  Surgeon: Milus Banister, MD;  Location: [facility];  Service: Endoscopy;  Laterality: N/A;  ? ESOPHAGOGASTRODUODENOSCOPY (EGD) WITH PROPOFOL N/A 04/08/2020  ? Procedure: ESOPHAGOGASTRODUODENOSCOPY (EGD) WITH PROPOFOL;  Surgeon: Ladene Artist, MD;  [facility];  Service: Endoscopy;  Laterality: N/A;  ? HOT HEMOSTASIS N/A 04/08/2020  ? Procedure: HOT HEMOSTASIS (ARGON PLASMA COAGULATION/BICAP);  Surgeon: Ladene Artist, MD;  Location: [facility];  Service: Endoscopy;  Laterality: N/A;  ? KNEE ARTHROSCOPY  10/00  ? Right  ? LEFT HEART CATH AND CORONARY ANGIOGRAPHY N/A 05/23/2021  ? Procedure: LEFT HEART CATH AND CORONARY ANGIOGRAPHY;  Surgeon: [name], MD;  Location: [facility];  Service: Cardiovascular;  Laterality: N/A;  ? LOWER EXTREMITY ANGIOGRAPHY Right 12/27/2017  ? Procedure: LOWER EXTREMITY ANGIOGRAPHY;  Surgeon: Algernon Huxley, MD;  Location: [facility];  Service: Cardiovascular;  Laterality: Right;  ? LOWER EXTREMITY ANGIOGRAPHY Left 01/10/2018  ? Procedure: LOWER EXTREMITY ANGIOGRAPHY;  Surgeon: Algernon Huxley, MD;  Location: [facility];  Service: Cardiovascular;  Laterality: Left;  ? LOWER EXTREMITY ANGIOGRAPHY Left 05/29/2018  ? Procedure: LOWER EXTREMITY ANGIOGRAPHY;  Surgeon: Algernon Huxley, MD;  Location: [facility];  Service: Cardiovascular;  Laterality:  Left;  ? LOWER EXTREMITY ANGIOGRAPHY Left 05/30/2018  ? Procedure: Lower Extremity Angiography;  Surgeon: Algernon Huxley, MD;  Location: [facility];  Service: Cardiovascular;   Laterality: Left;  ? LP  06/03  ? Microwave thermotherapy prostate  08/28/07  ? Rogers Blocker  ? Persantine cardiolite  03/01  ? EF 68%  ? POLYPECTOMY  11/95  ? Stress myoview  07/06  ? EF 67%  ? UPPER GASTROINTESTINAL ENDOSCOPY    ? ? ?FAMILY HISTORY: family history includes Alcohol abuse in his paternal uncle, paternal uncle, and paternal uncle; Diabetes in his father; Rheum arthritis in his mother. ? ?SOCIAL HISTORY:  reports that he quit smoking about 3 weeks ago. His smoking use included cigarettes. He has a 114.00 pack-year smoking history. He has quit using smokeless tobacco.  His smokeless tobacco use included chew. He reports that he does not currently use alcohol. He reports that he does not use drugs. ? ?ALLERGIES: Patient has no known allergies. ? ?MEDICATIONS:  ?Current Outpatient Medications  ?Medication Sig Dispense Refill  ? acetaminophen (TYLENOL) 500 MG tablet Take 1,000 mg by mouth every 6 (six) hours as needed (hip pain.). Rapid release    ? aspirin 81 MG EC tablet Take 81 mg by mouth in the morning.    ? atorvastatin (LIPITOR) 20 MG tablet TAKE 1 TABLET BY MOUTH DAILY IN THE EVENING FOR CHOLESTEROL. Office visit required for further refills. (Patient taking differently: Take 20 mg by mouth daily.) 90 tablet 0  ? clopidogrel (PLAVIX) 75 MG tablet Take 1 tablet (75 mg total) by mouth daily. 30 tablet 6  ? ferrous gluconate (FERGON) 324 MG tablet TAKE 1 TABLET BY MOUTH EVERY DAY WITH BREAKFAST (Patient taking differently: Take 324 mg by mouth daily with breakfast.) 30 tablet 0  ? FIBER PO Take 1 capsule by mouth in the morning.    ? finasteride (PROSCAR) 5 MG tablet TAKE 1 TABLET BY MOUTH EVERY DAY (Patient taking differently: Take 5 mg by mouth daily.) 90 tablet 3  ? Fluticasone-Umeclidin-Vilant (TRELEGY ELLIPTA) 100-62.5-25 MCG/ACT AEPB Inhale 100 mcg into the lungs daily. 28 each 0  ? glipiZIDE (GLUCOTROL) 10 MG tablet Take 10 mg by mouth daily before supper.     ? GLUCOSAMINE HCL PO Take 1 tablet by  mouth daily.    ? insulin detemir (LEVEMIR) 100 UNIT/ML injection Inject 20 units at bedtime. (Patient taking differently: Inject 15 Units into the skin daily.) 10 pen 5  ? lisinopril-hydrochlorothiazide (ZESTORETIC) 20-25 MG tablet Take 1 tablet by mouth daily. For blood pressure. 90 tablet 3  ? metFORMIN (GLUCOPHAGE) 500 MG tablet TAKE 2 TABLETS BY MOUTH TWICE A DAY WITH MEAL FOR DIABETES (Patient taking differently: Take 1,000 mg by mouth 2 (two) times daily with a meal.) 360 tablet 2  ? pantoprazole (PROTONIX) 40 MG tablet Take 1 tablet (40 mg total) by mouth daily. For heartburn 90 tablet 3  ? sildenafil (VIAGRA) 100 MG tablet Take 1 tablet (100 mg total) by mouth daily as needed for erectile dysfunction. 30 tablet 0  ? tamsulosin (FLOMAX) 0.4 MG CAPS capsule TAKE 1 CAPSULE BY MOUTH 2 TIMES DAILY. (Patient taking differently: Take 0.4 mg by mouth 2 (two) times daily.) 180 capsule 3  ? vitamin B-12 (CYANOCOBALAMIN) 1000 MCG tablet Take 1,000 mcg by mouth in the morning.    ? ?No current facility-administered medications for this encounter.  ? ? ?ECOG PERFORMANCE STATUS:  0 - Asymptomatic ? ?REVIEW OF SYSTEMS: Patient has history  of sinus bradycardia and secondary AV block ?Patient denies any weight loss, fatigue, weakness, fever, chills or night sweats. Patient denies any loss of vision, blurred vision. Patient denies any ringing  of the ears or hearing loss. No irregular heartbeat. Patient denies heart murmur or history of fainting. Patient denies any chest pain or pain radiating to her upper extremities. Patient denies any shortness of breath, difficulty breathing at night, cough or hemoptysis. Patient denies any swelling in the lower legs. Patient denies any nausea vomiting, vomiting of blood, or coffee ground material in the vomitus. Patient denies any stomach pain. Patient states has had normal bowel movements no significant constipation or diarrhea. Patient denies any dysuria, hematuria or significant  nocturia. Patient denies any problems walking, swelling in the joints or loss of balance. Patient denies any skin changes, loss of hair or loss of weight. Patient denies any excessive worrying or anxiety or signifi

## 2021-06-06 ENCOUNTER — Other Ambulatory Visit: Payer: Self-pay | Admitting: Gastroenterology

## 2021-06-06 DIAGNOSIS — K2990 Gastroduodenitis, unspecified, without bleeding: Secondary | ICD-10-CM

## 2021-06-08 NOTE — Progress Notes (Signed)
? ?Cardiology Office Note   ? ?Date:  06/09/2021  ? ?ID:  Antonio [name], DOB Jun 03, 1945, MRN [id number] ? ?PCP:  Pleas Koch, NP  ?Cardiologist:  Kate Sable, MD  ?Electrophysiologist:  None  ? ?Chief Complaint: Hospital follow-up ? ?History of Present Illness:  ? ?Antonio [name] is a 76 y.o. 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 ? ?He was evaluated as a new patient on 05/13/2021, for preoperative cardiac risk stratification for planned bronchoscopy for recently diagnosed lung mass concerning for carcinoma that was identified on lung cancer screening CT on 04/12/2021.  He was felt to be acceptable risk for noncardiac procedure.  He did note some exertional dyspnea with EKG demonstrating a possible prior infarct.  In this setting echo and Lexiscan were recommended.  He underwent Lexiscan MPI on 05/20/2021 with stress test being canceled due to symptomatic bradycardia with rates in the 30s bpm.  Resting images demonstrated decreased perfusion in the inferior wall, though images were unable to exclude diaphragmatic attenuation artifact.  He was transferred to [facility].  EKG demonstrated Mobitz second-degree AV block type I and first-degree AV block.  Echo on 05/20/2021 demonstrated an EF of 60 to 65%, no regional wall motion abnormalities, normal RV systolic function and ventricular cavity size, mildly dilated left atrium, and no significant valvular abnormalities.  Coronary CTA on 05/22/2021 showed a calcium score 1524 which was the 84th percentile along with severe CAD in the proximal OM1, left circumflex, R PLA, and  proximal D2.  CT FFR was abnormal in the distal LAD, distal LCx, distal OM1, and RPLA.  Subsequent LHC on 05/23/2021 showed nonobstructive CAD with 50% OM1 stenosis and 20% distal RCA stenosis.  The LAD exhibited minimal luminal irregularities.  Medical therapy was recommended.  Patient was evaluated by EP with stress testing EKG consistent with winky block with grouped beating.  He was felt to have chronic bradycardia with no indication for pacing.  He had good heart rate excursion.  Telemetry continues to demonstrate sinus bradycardia with Mobitz type I second-degree AV block.  It was recommended the patient follow-up with EP. ? ?He has since established with radiology oncology for stage I non-small cell lung cancer of the left upper lobe with recommendation for SBRT. ? ?He comes in accompanied by his wife today and is doing very well from a cardiac perspective.  He is without symptoms of angina or decompensation.  He does note some stable mild positional dizziness if he stands up too quickly.  Otherwise, he is without further dizziness, presyncope, or syncope.  No falls or symptoms concerning for bleeding.  Tolerating cardiac medications without issues. ? ? ?Labs independently reviewed: ?05/2021 - potassium 4.2, BUN 18, serum creatinine 0.87, Hgb 11.8, PLT 223, A1c 7.9 ?11/2020 - TC 113, TG 157, HDL 37, LDL 44 ?11/2019 - albumin 4.1, AST/ALT normal ?05/2018 - magnesium 1.8 ?09/2017 - TSH normal ? ?Past Medical History:  ?Diagnosis Date  ? Anemia   ? Arthritis   ? Asthma   ? age 55  ? BPH with urinary obstruction   ? stable on flomax ([name]  ? Coronary atherosclerosis  of unspecified type of vessel, native or graft   ? 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 Emphysema of lung ([place])   ? Esophageal stricture   ? Gastritis   ? GERD (gastroesophageal reflux disease)   ? Hiatal hernia   ? Hx of colonic polyps   ? Hyperlipidemia   ? Hypertension   ? Internal hemorrhoids without mention of complication   ? Lumbago   ? Pain in  both lower legs   ? Pneumonia   ? PSA elevation   ? now averaging 2's  ? Tobacco abuse   ? URI (upper respiratory infection)   ? Urinary retention   ? ? ?Past Surgical History:  ?Procedure Laterality Date  ? 2D Echo  06/03  ? Abdominal ultrasound  06/03  ? Negative  ? BACK SURGERY    ? COLONOSCOPY    ? CT of head  and sinuses  06/03  ? Negative  ? CT of the chest, abdomen, pelvis  06/03  ? Chest negative; Abdomen, left adrenal lesion; Pelvis, enlarged prostate  ? ESOPHAGOGASTRODUODENOSCOPY  02/13/07  ? gastritis and duodenitis without bleed  ? ESOPHAGOGASTRODUODENOSCOPY N/A 06/26/2014  ? Procedure: ESOPHAGOGASTRODUODENOSCOPY (EGD);  Surgeon: Milus Banister, MD;  Location: [facility];  Service: Endoscopy;  Laterality: N/A;  ? ESOPHAGOGASTRODUODENOSCOPY (EGD) WITH PROPOFOL N/A 04/08/2020  ? Procedure: ESOPHAGOGASTRODUODENOSCOPY (EGD) WITH PROPOFOL;  Surgeon: Ladene Artist, MD;  Location: [facility];  Service: Endoscopy;  Laterality: N/A;  ? HOT HEMOSTASIS N/A 04/08/2020  ? Procedure: HOT HEMOSTASIS (ARGON PLASMA COAGULATION/BICAP);  Surgeon: Ladene Artist, MD;  Location: [facility];  Service: Endoscopy;  Laterality: N/A;  ? KNEE ARTHROSCOPY  10/00  ? Right  ? LEFT HEART CATH AND CORONARY ANGIOGRAPHY N/A 05/23/2021  ? Procedure: LEFT HEART CATH AND CORONARY ANGIOGRAPHY;  Surgeon: [name], MD;  Location: [facility];  Service: Cardiovascular;  Laterality: N/A;  ? LOWER EXTREMITY ANGIOGRAPHY Right 12/27/2017  ? Procedure: LOWER EXTREMITY ANGIOGRAPHY;  Surgeon: Algernon Huxley, MD;  Location: [place] [facility];  Service: Cardiovascular;  Laterality: Right;  ? LOWER EXTREMITY ANGIOGRAPHY Left 01/10/2018  ? Procedure: LOWER EXTREMITY ANGIOGRAPHY;  Surgeon: Algernon Huxley, MD;  Location: [place] [facility];  Service: Cardiovascular;  Laterality: Left;  ? LOWER EXTREMITY ANGIOGRAPHY Left 05/29/2018  ? Procedure: LOWER EXTREMITY ANGIOGRAPHY;  Surgeon: Algernon Huxley, MD;  Location: [place] [facility];   Service: Cardiovascular;  Laterality: Left;  ? LOWER EXTREMITY ANGIOGRAPHY Left 05/30/2018  ? Procedure: Lower Extremity Angiography;  Surgeon: Algernon Huxley, MD;  Location: [place] [facility];  Service: Cardiovascular;  Laterality: Left;  ? LP  06/03  ? Microwave thermotherapy prostate  08/28/07  ? Rogers Blocker  ? Persantine cardiolite  03/01  ? EF 68%  ? POLYPECTOMY  11/95  ? Stress myoview  07/06  ? EF 67%  ? UPPER GASTROINTESTINAL ENDOSCOPY    ? ? ?Current Medications: ?Current Meds  ?Medication Sig  ? acetaminophen (TYLENOL) 500 MG tablet Take 1,000 mg by mouth every 6 (six) hours as needed (hip pain.). Rapid release  ? aspirin 81 MG EC tablet Take 81 mg by mouth in the morning.  ? atorvastatin (LIPITOR) 20 MG tablet TAKE 1 TABLET BY MOUTH DAILY IN THE EVENING FOR CHOLESTEROL. Office visit required for further refills. (Patient taking differently: Take 20 mg by mouth daily.)  ? clopidogrel (PLAVIX) 75 MG tablet Take 1 tablet (75 mg total) by mouth daily.  ? ferrous  gluconate (FERGON) 324 MG tablet TAKE 1 TABLET BY MOUTH EVERY DAY WITH BREAKFAST  ? FIBER PO Take 1 capsule by mouth in the morning.  ? finasteride (PROSCAR) 5 MG tablet TAKE 1 TABLET BY MOUTH EVERY DAY (Patient taking differently: Take 5 mg by mouth daily.)  ? Fluticasone-Umeclidin-Vilant (TRELEGY ELLIPTA) 100-62.5-25 MCG/ACT AEPB Inhale 100 mcg into the lungs daily.  ? glipiZIDE (GLUCOTROL) 10 MG tablet Take 10 mg by mouth daily before supper.   ? GLUCOSAMINE HCL PO Take 1 tablet by mouth daily.  ? insulin detemir (LEVEMIR) 100 UNIT/ML injection Inject 20 units at bedtime. (Patient taking differently: Inject 15 Units into the skin daily.)  ? lisinopril-hydrochlorothiazide (ZESTORETIC) 20-25 MG tablet Take 1 tablet by mouth daily. For blood pressure.  ? metFORMIN (GLUCOPHAGE) 500 MG tablet TAKE 2 TABLETS BY MOUTH TWICE A DAY WITH MEAL FOR DIABETES (Patient taking differently: Take 1,000 mg by mouth 2 (two) times daily with a meal.)  ? pantoprazole  (PROTONIX) 40 MG tablet Take 1 tablet (40 mg total) by mouth daily. For heartburn  ? sildenafil (VIAGRA) 100 MG tablet Take 1 tablet (100 mg total) by mouth daily as needed for erectile dysfunction.  ? tamsulosin (F

## 2021-06-09 ENCOUNTER — Other Ambulatory Visit
Admission: RE | Admit: 2021-06-09 | Discharge: 2021-06-09 | Disposition: A | Discharge status: Home / Self Care | Payer: Medicare Other | Source: Ambulatory Visit | Attending: Physician Assistant | Admitting: Physician Assistant

## 2021-06-09 ENCOUNTER — Ambulatory Visit (INDEPENDENT_AMBULATORY_CARE_PROVIDER_SITE_OTHER): Payer: Medicare Other | Admitting: Physician Assistant

## 2021-06-09 ENCOUNTER — Ambulatory Visit: Payer: Medicare Other | Admitting: Physician Assistant

## 2021-06-09 ENCOUNTER — Encounter: Payer: Self-pay | Admitting: Physician Assistant

## 2021-06-09 ENCOUNTER — Ambulatory Visit: Payer: Medicare Other | Admitting: Internal Medicine

## 2021-06-09 VITALS — BP 158/70 | HR 40 | Ht 66.5 in | Wt 165.5 lb

## 2021-06-09 DIAGNOSIS — R001 Bradycardia, unspecified: Secondary | ICD-10-CM

## 2021-06-09 DIAGNOSIS — C3490 Malignant neoplasm of unspecified part of unspecified bronchus or lung: Secondary | ICD-10-CM

## 2021-06-09 DIAGNOSIS — I441 Atrioventricular block, second degree: Secondary | ICD-10-CM | POA: Diagnosis not present

## 2021-06-09 DIAGNOSIS — E785 Hyperlipidemia, unspecified: Secondary | ICD-10-CM

## 2021-06-09 DIAGNOSIS — I2510 Atherosclerotic heart disease of native coronary artery without angina pectoris: Secondary | ICD-10-CM | POA: Diagnosis not present

## 2021-06-09 DIAGNOSIS — I10 Essential (primary) hypertension: Secondary | ICD-10-CM

## 2021-06-09 DIAGNOSIS — I739 Peripheral vascular disease, unspecified: Secondary | ICD-10-CM

## 2021-06-09 LAB — TSH: TSH: 2.038 u[IU]/mL (ref 0.350–4.500)

## 2021-06-09 NOTE — Patient Instructions (Addendum)
Medication Instructions:  ?No changes at this time.  ? ?*If you need a refill on your cardiac medications before your next appointment, please call your pharmacy* ? ? ?Lab Work: ?TSH today. Go to [facility] and check in at registration.  ? ?If you have labs (blood work) drawn today and your tests are completely normal, you will receive your results only by: ?MyChart Message (if you have MyChart) OR ?A paper copy in the mail ?If you have any lab test that is abnormal or we need to change your treatment, we will call you to review the results. ? ? ?Testing/Procedures: ?None ? ? ?Follow-Up: ?At [facility], you and your health needs are our priority.  As part of our continuing mission to provide you with exceptional heart care, we have created designated Provider Care Teams.  These Care Teams include your primary Cardiologist (physician) and Advanced Practice Providers (APPs -  Physician Assistants and Nurse Practitioners) who all work together to provide you with the care you need, when you need it. ? ? ?Your next appointment:   ?6 month(s) ? ?The format for your next appointment:   ?In Person ? ?Provider:   ?Virl Axe, MD  ? ? ? ? ? ?Important Information About Sugar ? ? ? ? ?  ?

## 2021-06-10 ENCOUNTER — Ambulatory Visit: Payer: Medicare Other | Admitting: Cardiovascular Disease

## 2021-06-10 ENCOUNTER — Ambulatory Visit: Payer: Medicare Other

## 2021-06-14 ENCOUNTER — Telehealth: Payer: Self-pay

## 2021-06-14 DIAGNOSIS — Z794 Long term (current) use of insulin: Secondary | ICD-10-CM | POA: Diagnosis not present

## 2021-06-14 DIAGNOSIS — E1142 Type 2 diabetes mellitus with diabetic polyneuropathy: Secondary | ICD-10-CM | POA: Diagnosis not present

## 2021-06-14 DIAGNOSIS — E1159 Type 2 diabetes mellitus with other circulatory complications: Secondary | ICD-10-CM | POA: Diagnosis not present

## 2021-06-14 DIAGNOSIS — E1129 Type 2 diabetes mellitus with other diabetic kidney complication: Secondary | ICD-10-CM | POA: Diagnosis not present

## 2021-06-14 DIAGNOSIS — R809 Proteinuria, unspecified: Secondary | ICD-10-CM | POA: Diagnosis not present

## 2021-06-14 DIAGNOSIS — C3492 Malignant neoplasm of unspecified part of left bronchus or lung: Secondary | ICD-10-CM | POA: Diagnosis not present

## 2021-06-14 NOTE — Progress Notes (Signed)
Chronic Care Management Pharmacy Assistant   Name: Antonio [name]  MRN: [id number] DOB: [birth year]  Reason for Encounter: CCM (General Adherence)   Recent office visits:  05/25/21 Alma Friendly, NP HTN Remain off Amlodipine.  05/12/21 Alma Friendly, NP Diabetes: A1c 7.4. Change: Lisinopril-HCTZ 20-25 MG tablet  Recent consult visits:  06/09/21 Christell Faith, PA (Cardiology): Coronary artery disease. No med changes.  05/18/21 Robotic Assisted Bronchoscopy with Cellvizio 05/17/21 VAS [place] ABI with/wo TBI 05/13/21 Kate Sable, MD (Cardiology): Pre-Op Cardiovascular Exam: Change: Amlodipine 10 mg vs. 5 mg. Stop (provider): Metoprolol 50 mg. Ordered: NM Myocar Single, EKG, Cardiac Stress Test and Echocardiogram.  05/09/21 CT SUPERD Chest  04/08/21 Vernard Gambles, MD (Pulmonary Disease): Nodule of left lung Ordered: CT Super D chest. Start: TRELEGY ELLIPTA 100-62.5-25 MCG. Stop all due to patient preference: WELLBUTRIN SR 150 MG 12 hr, ZANAFLEX 4 MG tablet and KENALOG 0.5 %.  04/27/21 Zara Council, PA (Urology): UTI "urine culture was positive for infection again". Start: VIAGRA 100 MG tablet. Stop (completed): BACTRIM DS) 800-160 MG tablet. 04/12/21 Low Dose CT - scan was read as a lung RADS 4B, suspicious recommendation is for additional imaging evaluation or consult with pulmonary or thoracic surgery.  03/29/21 Zara Council, PA (Urology): UTI Abnormal Labs "urine culture was positive for infection and that the Septra DS is the appropriate antibiotic".  Start: BACTRIM DS 800-160 MG tablet.   Hospital visits:  Medication Reconciliation was completed by comparing discharge summary, patient's EMR and Pharmacy list, and upon discussion with patient.  Admitted to the hospital on 05/20/2021 due to Bradycardia. Discharge date was 05/23/2021. Discharged from [facility].    Procedures: Left heart cath and coronary angiography.  Medications Discontinued at Hospital  Discharge: -Stopped Amlodipine 10 mg  Medications that remain the same after Hospital Discharge:??  -All other medications will remain the same.    Medications: Outpatient Encounter Medications as of 06/14/2021  Medication Sig   acetaminophen (TYLENOL) 500 MG tablet Take 1,000 mg by mouth every 6 (six) hours as needed (hip pain.). Rapid release   aspirin 81 MG EC tablet Take 81 mg by mouth in the morning.   atorvastatin (LIPITOR) 20 MG tablet TAKE 1 TABLET BY MOUTH DAILY IN THE EVENING FOR CHOLESTEROL. Office visit required for further refills. (Patient taking differently: Take 20 mg by mouth daily.)   clopidogrel (PLAVIX) 75 MG tablet Take 1 tablet (75 mg total) by mouth daily.   ferrous gluconate (FERGON) 324 MG tablet TAKE 1 TABLET BY MOUTH EVERY DAY WITH BREAKFAST   FIBER PO Take 1 capsule by mouth in the morning.   finasteride (PROSCAR) 5 MG tablet TAKE 1 TABLET BY MOUTH EVERY DAY (Patient taking differently: Take 5 mg by mouth daily.)   Fluticasone-Umeclidin-Vilant (TRELEGY ELLIPTA) 100-62.5-25 MCG/ACT AEPB Inhale 100 mcg into the lungs daily.   glipiZIDE (GLUCOTROL) 10 MG tablet Take 10 mg by mouth daily before supper.    GLUCOSAMINE HCL PO Take 1 tablet by mouth daily.   insulin detemir (LEVEMIR) 100 UNIT/ML injection Inject 20 units at bedtime. (Patient taking differently: Inject 15 Units into the skin daily.)   lisinopril-hydrochlorothiazide (ZESTORETIC) 20-25 MG tablet Take 1 tablet by mouth daily. For blood pressure.   metFORMIN (GLUCOPHAGE) 500 MG tablet TAKE 2 TABLETS BY MOUTH TWICE A DAY WITH MEAL FOR DIABETES (Patient taking differently: Take 1,000 mg by mouth 2 (two) times daily with a meal.)   pantoprazole (PROTONIX) 40 MG tablet Take 1 tablet (40 mg total)  by mouth daily. For heartburn   sildenafil (VIAGRA) 100 MG tablet Take 1 tablet (100 mg total) by mouth daily as needed for erectile dysfunction.   tamsulosin (FLOMAX) 0.4 MG CAPS capsule TAKE 1 CAPSULE BY MOUTH 2 TIMES  DAILY. (Patient taking differently: Take 0.4 mg by mouth 2 (two) times daily.)   vitamin B-12 (CYANOCOBALAMIN) 1000 MCG tablet Take 1,000 mcg by mouth in the morning.   No facility-administered encounter medications on file as of 06/14/2021.    [name] on 06/14/2021 for general disease state and medication adherence call.   Patient is not more than 5 days past due for refill on the following medications per chart history:  [facility] Medications: Medication Name/mg Last Fill Days Supply Lisinopril-HCTZ 20-25 mg 05/31/2021 90 Atorvastatin 20 mg  04/27/2021 90   Metformin 500 mg  05/10/2021 90   Glipizide 10 mg  04/12/2021 90    What concerns do you have about your medications? Patient does not have any concerns. Patient reports 100% compliance with his medications.   The patient denies side effects with their medications.   How often do you forget or accidentally miss a dose? Never  Do you use a pillbox? Yes  Are you having any problems getting your medications from your pharmacy? No  Has the cost of your medications been a concern? No  Since last visit with CPP, the following interventions have been made.  Start: TRELEGY ELLIPTA 100-62.5-25 MCG.  Change: Lisinopril-HCTZ 20-25 MG tablet Remain off Amlodipine Stop (provider): Metoprolol 50 mg Stop due to patient preference: WELLBUTRIN SR 150 MG 12 hr Stop due to patient preference:  ZANAFLEX 4 MG tablet  Stop due to patient preference: KENALOG 0.5 %.  Stop (completed): BACTRIM DS) 800-160 MG tablet  The patient has had an ED visit since last contact.   The patient reports the following problems with their health.  Patient reports he has a spot on his lungs and he has a scan for that tomorrow.   Patient denies concerns or questions for Charlene Brooke, PharmD at this time.   Care Gaps: Annual wellness visit in last year? Yes 01/20/2021  If Diabetic: Most recent A1C reading: 7.4 on 05/12/2021 Last eye exam /  retinopathy screening: 06/03/2020 Last diabetic foot exam: 05/16/2018  Upcoming appointments: CCM appointment on 03/29/2022  Charlene Brooke, CPP notified  Marijean Niemann, [place] Assistant [phone]

## 2021-06-15 ENCOUNTER — Ambulatory Visit
Admission: RE | Admit: 2021-06-15 | Discharge: 2021-06-15 | Disposition: A | Discharge status: Home / Self Care | Payer: Medicare Other | Source: Ambulatory Visit | Attending: Radiation Oncology | Admitting: Radiation Oncology

## 2021-06-15 DIAGNOSIS — R911 Solitary pulmonary nodule: Secondary | ICD-10-CM | POA: Diagnosis not present

## 2021-06-15 DIAGNOSIS — Z87891 Personal history of nicotine dependence: Secondary | ICD-10-CM | POA: Diagnosis not present

## 2021-06-15 DIAGNOSIS — Z510 Encounter for antineoplastic radiation therapy: Secondary | ICD-10-CM | POA: Diagnosis not present

## 2021-06-15 DIAGNOSIS — C3412 Malignant neoplasm of upper lobe, left bronchus or lung: Secondary | ICD-10-CM | POA: Diagnosis not present

## 2021-06-19 DIAGNOSIS — C3490 Malignant neoplasm of unspecified part of unspecified bronchus or lung: Secondary | ICD-10-CM | POA: Insufficient documentation

## 2021-06-19 DIAGNOSIS — I700 Atherosclerosis of aorta: Secondary | ICD-10-CM | POA: Insufficient documentation

## 2021-06-20 DIAGNOSIS — E113393 Type 2 diabetes mellitus with moderate nonproliferative diabetic retinopathy without macular edema, bilateral: Secondary | ICD-10-CM | POA: Diagnosis not present

## 2021-06-20 DIAGNOSIS — H2513 Age-related nuclear cataract, bilateral: Secondary | ICD-10-CM | POA: Diagnosis not present

## 2021-06-20 LAB — HM DIABETES EYE EXAM

## 2021-06-23 ENCOUNTER — Telehealth: Payer: Self-pay

## 2021-06-23 DIAGNOSIS — Z87891 Personal history of nicotine dependence: Secondary | ICD-10-CM | POA: Diagnosis not present

## 2021-06-23 DIAGNOSIS — R911 Solitary pulmonary nodule: Secondary | ICD-10-CM | POA: Diagnosis not present

## 2021-06-23 DIAGNOSIS — Z510 Encounter for antineoplastic radiation therapy: Secondary | ICD-10-CM | POA: Diagnosis not present

## 2021-06-23 DIAGNOSIS — C3412 Malignant neoplasm of upper lobe, left bronchus or lung: Secondary | ICD-10-CM | POA: Diagnosis not present

## 2021-06-23 NOTE — Telephone Encounter (Signed)
Advised pt of msg, scheduled him for 07/06/21. Pt confirmed and expressed understanding.

## 2021-06-23 NOTE — Telephone Encounter (Signed)
-----   Message from Nori Riis, PA-C sent at 06/22/2021  3:20 PM EDT ----- Would you have Antonio [name] schedule an appointment?  We need to recheck his urine and the PET scan saw some activity in his prostate, so we need to check a PSA as well.

## 2021-06-27 ENCOUNTER — Other Ambulatory Visit: Payer: Self-pay

## 2021-06-27 ENCOUNTER — Ambulatory Visit
Admission: RE | Admit: 2021-06-27 | Discharge: 2021-06-27 | Disposition: A | Discharge status: Home / Self Care | Payer: Medicare Other | Source: Ambulatory Visit | Attending: Radiation Oncology | Admitting: Radiation Oncology

## 2021-06-27 DIAGNOSIS — C3412 Malignant neoplasm of upper lobe, left bronchus or lung: Secondary | ICD-10-CM | POA: Diagnosis not present

## 2021-06-27 DIAGNOSIS — Z510 Encounter for antineoplastic radiation therapy: Secondary | ICD-10-CM | POA: Diagnosis not present

## 2021-06-27 DIAGNOSIS — R911 Solitary pulmonary nodule: Secondary | ICD-10-CM | POA: Diagnosis not present

## 2021-06-27 DIAGNOSIS — Z87891 Personal history of nicotine dependence: Secondary | ICD-10-CM | POA: Diagnosis not present

## 2021-06-27 LAB — RAD ONC ARIA SESSION SUMMARY
Course Elapsed Days: 0
Plan Fractions Treated to Date: 1
Plan Prescribed Dose Per Fraction: 6 Gy
Plan Total Fractions Prescribed: 10
Plan Total Prescribed Dose: 60 Gy
Reference Point Dosage Given to Date: 6 Gy
Reference Point Session Dosage Given: 6 Gy
Session Number: 1

## 2021-06-28 ENCOUNTER — Ambulatory Visit
Admission: RE | Admit: 2021-06-28 | Discharge: 2021-06-28 | Disposition: A | Discharge status: Home / Self Care | Payer: Medicare Other | Source: Ambulatory Visit | Attending: Radiation Oncology | Admitting: Radiation Oncology

## 2021-06-28 ENCOUNTER — Ambulatory Visit: Payer: Medicare Other | Admitting: Internal Medicine

## 2021-06-28 ENCOUNTER — Other Ambulatory Visit: Payer: Self-pay

## 2021-06-28 DIAGNOSIS — Z87891 Personal history of nicotine dependence: Secondary | ICD-10-CM | POA: Diagnosis not present

## 2021-06-28 DIAGNOSIS — R911 Solitary pulmonary nodule: Secondary | ICD-10-CM | POA: Diagnosis not present

## 2021-06-28 DIAGNOSIS — Z510 Encounter for antineoplastic radiation therapy: Secondary | ICD-10-CM | POA: Diagnosis not present

## 2021-06-28 DIAGNOSIS — C3412 Malignant neoplasm of upper lobe, left bronchus or lung: Secondary | ICD-10-CM | POA: Diagnosis not present

## 2021-06-28 LAB — RAD ONC ARIA SESSION SUMMARY
Course Elapsed Days: 1
Plan Fractions Treated to Date: 2
Plan Prescribed Dose Per Fraction: 6 Gy
Plan Total Fractions Prescribed: 10
Plan Total Prescribed Dose: 60 Gy
Reference Point Dosage Given to Date: 12 Gy
Reference Point Session Dosage Given: 6 Gy
Session Number: 2

## 2021-06-29 ENCOUNTER — Ambulatory Visit
Admission: RE | Admit: 2021-06-29 | Discharge: 2021-06-29 | Disposition: A | Discharge status: Home / Self Care | Payer: Medicare Other | Source: Ambulatory Visit | Attending: Radiation Oncology | Admitting: Radiation Oncology

## 2021-06-29 ENCOUNTER — Other Ambulatory Visit: Payer: Self-pay

## 2021-06-29 ENCOUNTER — Ambulatory Visit: Payer: Medicare Other

## 2021-06-29 DIAGNOSIS — C3412 Malignant neoplasm of upper lobe, left bronchus or lung: Secondary | ICD-10-CM | POA: Diagnosis not present

## 2021-06-29 DIAGNOSIS — Z87891 Personal history of nicotine dependence: Secondary | ICD-10-CM | POA: Diagnosis not present

## 2021-06-29 DIAGNOSIS — R911 Solitary pulmonary nodule: Secondary | ICD-10-CM | POA: Diagnosis not present

## 2021-06-29 DIAGNOSIS — Z510 Encounter for antineoplastic radiation therapy: Secondary | ICD-10-CM | POA: Diagnosis not present

## 2021-06-29 LAB — RAD ONC ARIA SESSION SUMMARY
Course Elapsed Days: 2
Plan Fractions Treated to Date: 3
Plan Prescribed Dose Per Fraction: 6 Gy
Plan Total Fractions Prescribed: 10
Plan Total Prescribed Dose: 60 Gy
Reference Point Dosage Given to Date: 18 Gy
Reference Point Session Dosage Given: 6 Gy
Session Number: 3

## 2021-06-30 ENCOUNTER — Ambulatory Visit
Admission: RE | Admit: 2021-06-30 | Discharge: 2021-06-30 | Disposition: A | Discharge status: Home / Self Care | Payer: Medicare Other | Source: Ambulatory Visit | Attending: Radiation Oncology | Admitting: Radiation Oncology

## 2021-06-30 ENCOUNTER — Other Ambulatory Visit: Payer: Self-pay

## 2021-06-30 ENCOUNTER — Ambulatory Visit: Payer: Medicare Other

## 2021-06-30 DIAGNOSIS — C3412 Malignant neoplasm of upper lobe, left bronchus or lung: Secondary | ICD-10-CM | POA: Diagnosis not present

## 2021-06-30 DIAGNOSIS — Z510 Encounter for antineoplastic radiation therapy: Secondary | ICD-10-CM | POA: Diagnosis not present

## 2021-06-30 DIAGNOSIS — R911 Solitary pulmonary nodule: Secondary | ICD-10-CM | POA: Diagnosis not present

## 2021-06-30 DIAGNOSIS — Z87891 Personal history of nicotine dependence: Secondary | ICD-10-CM | POA: Diagnosis not present

## 2021-06-30 LAB — RAD ONC ARIA SESSION SUMMARY
Course Elapsed Days: 3
Plan Fractions Treated to Date: 4
Plan Prescribed Dose Per Fraction: 6 Gy
Plan Total Fractions Prescribed: 10
Plan Total Prescribed Dose: 60 Gy
Reference Point Dosage Given to Date: 24 Gy
Reference Point Session Dosage Given: 6 Gy
Session Number: 4

## 2021-07-01 ENCOUNTER — Other Ambulatory Visit: Payer: Self-pay | Admitting: Gastroenterology

## 2021-07-01 ENCOUNTER — Ambulatory Visit
Admission: RE | Admit: 2021-07-01 | Discharge: 2021-07-01 | Disposition: A | Discharge status: Home / Self Care | Payer: Medicare Other | Source: Ambulatory Visit | Attending: Radiation Oncology | Admitting: Radiation Oncology

## 2021-07-01 ENCOUNTER — Other Ambulatory Visit: Payer: Self-pay

## 2021-07-01 DIAGNOSIS — R911 Solitary pulmonary nodule: Secondary | ICD-10-CM | POA: Diagnosis not present

## 2021-07-01 DIAGNOSIS — Z87891 Personal history of nicotine dependence: Secondary | ICD-10-CM | POA: Diagnosis not present

## 2021-07-01 DIAGNOSIS — C3412 Malignant neoplasm of upper lobe, left bronchus or lung: Secondary | ICD-10-CM | POA: Diagnosis not present

## 2021-07-01 DIAGNOSIS — K2990 Gastroduodenitis, unspecified, without bleeding: Secondary | ICD-10-CM

## 2021-07-01 DIAGNOSIS — Z510 Encounter for antineoplastic radiation therapy: Secondary | ICD-10-CM | POA: Diagnosis not present

## 2021-07-01 LAB — RAD ONC ARIA SESSION SUMMARY
Course Elapsed Days: 4
Plan Fractions Treated to Date: 5
Plan Prescribed Dose Per Fraction: 6 Gy
Plan Total Fractions Prescribed: 10
Plan Total Prescribed Dose: 60 Gy
Reference Point Dosage Given to Date: 30 Gy
Reference Point Session Dosage Given: 6 Gy
Session Number: 5

## 2021-07-05 ENCOUNTER — Other Ambulatory Visit: Payer: Self-pay

## 2021-07-05 ENCOUNTER — Ambulatory Visit
Admission: RE | Admit: 2021-07-05 | Discharge: 2021-07-05 | Disposition: A | Discharge status: Home / Self Care | Payer: Medicare Other | Source: Ambulatory Visit | Attending: Radiation Oncology | Admitting: Radiation Oncology

## 2021-07-05 DIAGNOSIS — Z510 Encounter for antineoplastic radiation therapy: Secondary | ICD-10-CM | POA: Diagnosis not present

## 2021-07-05 DIAGNOSIS — Z87891 Personal history of nicotine dependence: Secondary | ICD-10-CM | POA: Diagnosis not present

## 2021-07-05 DIAGNOSIS — R911 Solitary pulmonary nodule: Secondary | ICD-10-CM | POA: Diagnosis not present

## 2021-07-05 DIAGNOSIS — C3412 Malignant neoplasm of upper lobe, left bronchus or lung: Secondary | ICD-10-CM | POA: Diagnosis not present

## 2021-07-05 LAB — RAD ONC ARIA SESSION SUMMARY
Course Elapsed Days: 8
Plan Fractions Treated to Date: 6
Plan Prescribed Dose Per Fraction: 6 Gy
Plan Total Fractions Prescribed: 10
Plan Total Prescribed Dose: 60 Gy
Reference Point Dosage Given to Date: 36 Gy
Reference Point Session Dosage Given: 6 Gy
Session Number: 6

## 2021-07-05 NOTE — Progress Notes (Unsigned)
2:34 PM   Antonio [name] [birth year] [id number]  Referring provider: Pleas Koch, NP [place],  [place] [address]  No chief complaint on file.  Urological history 1.  Urinary retention -Contributing factors of age, excessive alcohol consumption, BPH and diabetes -Managed with tamsulosin 0.4 mg 2 capsules daily and finasteride 5 mg daily  2. BPH with LU TS -PSA 0.24 in 11/2019 -TARGIS > 10 years ago -cysto 2019 -very large prostate with significant lateral lobe hypertrophy trophy in the median lobe with intravesicular protrusion -Managed with tamsulosin 0.4 mg 2 capsules daily and finasteride 5 mg daily   HPI: Antonio [name] is a 76 y.o. male who presents today for follow up after a PET scan that was performed for further follow-up on his lung cancer noted a focal activity in the left prostate gland.    At his visit on April 27, 2021 with me, he was following up after he was diagnosed with a UTI.  He is symptoms had abated at that time but he still have persistent microscopic hematuria.  He is also having some questions concerning erections as he started dating a lady and was wanting to become more intimate.  He was given sildenafil 100 mg, on-demand dosing prescription.  His repeat urine culture was still positive for infection and he was prescribed culture appropriate antibiotics.  UA ***    Score: 1-7 Severe ED 8-11 Moderate ED 12-16 Mild-Moderate ED 17-21 Mild ED 22-25 No ED    PMH: Past Medical History:  Diagnosis Date   Anemia    Arthritis    Asthma    age 50   BPH with urinary obstruction    stable on flomax ([name]   Coronary atherosclerosis of unspecified type of vessel, native or graft    Diabetes mellitus    Type II   Emphysema of lung (HCC)    Esophageal stricture    Gastritis    GERD (gastroesophageal reflux disease)    Hiatal hernia    Hx of colonic polyps    Hyperlipidemia    Hypertension    Internal hemorrhoids  without mention of complication    Lumbago    Pain in both lower legs    Pneumonia    PSA elevation    now averaging 2's   Tobacco abuse    URI (upper respiratory infection)    Urinary retention     Surgical History: Past Surgical History:  Procedure Laterality Date   2D Echo  06/03   Abdominal ultrasound  06/03   Negative   BACK SURGERY     COLONOSCOPY     CT of head  and sinuses  06/03   Negative   CT of the chest, abdomen, pelvis  06/03   Chest negative; Abdomen, left adrenal lesion; Pelvis, enlarged prostate   ESOPHAGOGASTRODUODENOSCOPY  02/13/07   gastritis and duodenitis without bleed   ESOPHAGOGASTRODUODENOSCOPY N/A 06/26/2014   Procedure: ESOPHAGOGASTRODUODENOSCOPY (EGD);  Surgeon: Milus Banister, MD;  Location: [facility];  Service: Endoscopy;  Laterality: N/A;   ESOPHAGOGASTRODUODENOSCOPY (EGD) WITH PROPOFOL N/A 04/08/2020   Procedure: ESOPHAGOGASTRODUODENOSCOPY (EGD) WITH PROPOFOL;  Surgeon: Ladene Artist, MD;  Location: [facility];  Service: Endoscopy;  Laterality: N/A;   HOT HEMOSTASIS N/A 04/08/2020   Procedure: HOT HEMOSTASIS (ARGON PLASMA COAGULATION/BICAP);  Surgeon: Ladene Artist, MD;  Location: [facility];  Service: Endoscopy;  Laterality: N/A;   KNEE ARTHROSCOPY  10/00   Right   LEFT HEART  CATH AND CORONARY ANGIOGRAPHY N/A 05/23/2021   Procedure: LEFT HEART CATH AND CORONARY ANGIOGRAPHY;  Surgeon: [name], MD;  Location: [facility];  Service: Cardiovascular;  Laterality: N/A;   LOWER EXTREMITY ANGIOGRAPHY Right 12/27/2017   Procedure: LOWER EXTREMITY ANGIOGRAPHY;  Surgeon: Algernon Huxley, MD;  Location: [facility];  Service: Cardiovascular;  Laterality: Right;   LOWER EXTREMITY ANGIOGRAPHY Left 01/10/2018   Procedure: LOWER EXTREMITY ANGIOGRAPHY;  Surgeon: Algernon Huxley, MD;  Location: [facility];  Service: Cardiovascular;  Laterality: Left;   LOWER EXTREMITY ANGIOGRAPHY Left 05/29/2018   Procedure: LOWER EXTREMITY  ANGIOGRAPHY;  Surgeon: Algernon Huxley, MD;  Location: [facility];  Service: Cardiovascular;  Laterality: Left;   LOWER EXTREMITY ANGIOGRAPHY Left 05/30/2018   Procedure: Lower Extremity Angiography;  Surgeon: Algernon Huxley, MD;  Location: [facility];  Service: Cardiovascular;  Laterality: Left;   LP  06/03   Microwave thermotherapy prostate  08/28/07   [place]   Persantine cardiolite  03/01   EF 68%   POLYPECTOMY  11/95   Stress myoview  07/06   EF 67%   UPPER GASTROINTESTINAL ENDOSCOPY      Home Medications:  Allergies as of 07/06/2021   No Known Allergies      Medication List        Accurate as of Jul 05, 2021  2:34 PM. If you have any questions, ask your nurse or doctor.          acetaminophen 500 MG tablet Commonly known as: TYLENOL Take 1,000 mg by mouth every 6 (six) hours as needed (hip pain.). Rapid release   aspirin EC 81 MG tablet Take 81 mg by mouth in the morning.   atorvastatin 20 MG tablet Commonly known as: LIPITOR TAKE 1 TABLET BY MOUTH DAILY IN THE EVENING FOR CHOLESTEROL. Office visit required for further refills. What changed:  how much to take how to take this when to take this additional instructions   clopidogrel 75 MG tablet Commonly known as: PLAVIX Take 1 tablet (75 mg total) by mouth daily.   ferrous gluconate 324 MG tablet Commonly known as: FERGON TAKE 1 TABLET BY MOUTH EVERY DAY WITH BREAKFAST   FIBER PO Take 1 capsule by mouth in the morning.   finasteride 5 MG tablet Commonly known as: PROSCAR TAKE 1 TABLET BY MOUTH EVERY DAY   glipiZIDE 10 MG tablet Commonly known as: GLUCOTROL Take 10 mg by mouth daily before supper.   GLUCOSAMINE HCL PO Take 1 tablet by mouth daily.   insulin detemir 100 UNIT/ML injection Commonly known as: Levemir Inject 20 units at bedtime. What changed:  how much to take how to take this when to take this additional instructions   lisinopril-hydrochlorothiazide 20-25 MG  tablet Commonly known as: ZESTORETIC Take 1 tablet by mouth daily. For blood pressure.   metFORMIN 500 MG tablet Commonly known as: GLUCOPHAGE TAKE 2 TABLETS BY MOUTH TWICE A DAY WITH MEAL FOR DIABETES What changed:  how much to take how to take this when to take this additional instructions   pantoprazole 40 MG tablet Commonly known as: PROTONIX Take 1 tablet (40 mg total) by mouth daily. For heartburn   sildenafil 100 MG tablet Commonly known as: Viagra Take 1 tablet (100 mg total) by mouth daily as needed for erectile dysfunction.   tamsulosin 0.4 MG Caps capsule Commonly known as: FLOMAX TAKE 1 CAPSULE BY MOUTH 2 TIMES DAILY.   Trelegy  Ellipta 100-62.5-25 MCG/ACT Aepb Generic drug: Fluticasone-Umeclidin-Vilant Inhale 100 mcg into the lungs daily.   vitamin B-12 1000 MCG tablet Commonly known as: CYANOCOBALAMIN Take 1,000 mcg by mouth in the morning.        Allergies: No Known Allergies  Family History: Family History  Problem Relation Age of Onset   Diabetes Father    Alcohol abuse Paternal Uncle    Alcohol abuse Paternal Uncle    Alcohol abuse Paternal Uncle    Rheum arthritis Mother    Heart disease Neg Hx    Stroke Neg Hx    Cancer Neg Hx    Drug abuse Neg Hx    Depression Neg Hx    Colon cancer Neg Hx    Rectal cancer Neg Hx    Stomach cancer Neg Hx    Kidney cancer Neg Hx    Bladder Cancer Neg Hx    Prostate cancer Neg Hx     Social History:  reports that he quit smoking about 7 weeks ago. His smoking use included cigarettes. He has a 114.00 pack-year smoking history. He has quit using smokeless tobacco.  His smokeless tobacco use included chew. He reports that he does not currently use alcohol. He reports that he does not use drugs.  ROS: For pertinent review of systems please refer to history of present illness  Physical Exam: Blood pressure (!) 151/61, pulse 66, height 5\' 6"  (1.676 m), weight 164 lb (74.4 kg).  Constitutional:  Well  nourished. Alert and oriented, No acute distress. HEENT: [place] AT, moist mucus membranes.  Trachea midline Cardiovascular: No clubbing, cyanosis, or edema. Respiratory: Normal respiratory effort, no increased work of breathing. GU: No CVA tenderness.  No bladder fullness or masses.  Patient with circumcised/uncircumcised phallus. ***Foreskin easily retracted***  Urethral meatus is patent.  No penile discharge. No penile lesions or rashes. Scrotum without lesions, cysts, rashes and/or edema.  Testicles are located scrotally bilaterally. No masses are appreciated in the testicles. Left and right epididymis are normal. Rectal: Patient with  normal sphincter tone. Anus and perineum without scarring or rashes. No rectal masses are appreciated. Prostate is approximately *** grams, *** nodules are appreciated. Seminal vesicles are normal. Neurologic: Grossly intact, no focal deficits, moving all 4 extremities. Psychiatric: Normal mood and affect.   Laboratory Data: Component     Latest Ref Rng 06/09/2021  TSH     0.350 - 4.500 uIU/mL 2.038    Urinalysis *** I have reviewed the labs.  Pertinent Imaging: N/A  Assessment & Plan:   1. UTI -symptoms resolved, but urine still appears grossly infected -will send for culture in case he should relapse  2. Microscopic hematuria -UA still with micro heme, but appear grossly infected -PET scan is pending, may give some insight but will need to pursue hematuria work up if micro heme continue to persist   3. BPH with LU TS -Continue tamsulosin 0.4 mg 2 tablets daily and finasteride 5 mg daily -We will need repeat PSA if urinary tract has cleared  4. ED -recommended a trial of sildenafil 100 mg, on-demand-dosing   No follow-ups on file.  Zara Council, PA-C  [facility] [address] [place], [place] [address] [phone]

## 2021-07-06 ENCOUNTER — Ambulatory Visit (INDEPENDENT_AMBULATORY_CARE_PROVIDER_SITE_OTHER): Payer: Medicare Other | Admitting: Urology

## 2021-07-06 ENCOUNTER — Other Ambulatory Visit: Payer: Self-pay

## 2021-07-06 ENCOUNTER — Encounter: Payer: Self-pay | Admitting: Urology

## 2021-07-06 ENCOUNTER — Ambulatory Visit
Admission: RE | Admit: 2021-07-06 | Discharge: 2021-07-06 | Disposition: A | Discharge status: Home / Self Care | Payer: Medicare Other | Source: Ambulatory Visit | Attending: Radiation Oncology | Admitting: Radiation Oncology

## 2021-07-06 ENCOUNTER — Ambulatory Visit: Payer: Medicare Other

## 2021-07-06 VITALS — BP 215/81 | HR 71 | Ht 66.0 in | Wt 168.0 lb

## 2021-07-06 DIAGNOSIS — I2510 Atherosclerotic heart disease of native coronary artery without angina pectoris: Secondary | ICD-10-CM

## 2021-07-06 DIAGNOSIS — N401 Enlarged prostate with lower urinary tract symptoms: Secondary | ICD-10-CM | POA: Diagnosis not present

## 2021-07-06 DIAGNOSIS — Z510 Encounter for antineoplastic radiation therapy: Secondary | ICD-10-CM | POA: Diagnosis not present

## 2021-07-06 DIAGNOSIS — C3412 Malignant neoplasm of upper lobe, left bronchus or lung: Secondary | ICD-10-CM | POA: Diagnosis not present

## 2021-07-06 DIAGNOSIS — R8271 Bacteriuria: Secondary | ICD-10-CM | POA: Diagnosis not present

## 2021-07-06 DIAGNOSIS — R911 Solitary pulmonary nodule: Secondary | ICD-10-CM | POA: Diagnosis not present

## 2021-07-06 DIAGNOSIS — N529 Male erectile dysfunction, unspecified: Secondary | ICD-10-CM

## 2021-07-06 DIAGNOSIS — N390 Urinary tract infection, site not specified: Secondary | ICD-10-CM | POA: Diagnosis not present

## 2021-07-06 DIAGNOSIS — N138 Other obstructive and reflux uropathy: Secondary | ICD-10-CM

## 2021-07-06 DIAGNOSIS — Z87891 Personal history of nicotine dependence: Secondary | ICD-10-CM | POA: Diagnosis not present

## 2021-07-06 DIAGNOSIS — R3129 Other microscopic hematuria: Secondary | ICD-10-CM

## 2021-07-06 LAB — RAD ONC ARIA SESSION SUMMARY
Course Elapsed Days: 9
Plan Fractions Treated to Date: 7
Plan Prescribed Dose Per Fraction: 6 Gy
Plan Total Fractions Prescribed: 10
Plan Total Prescribed Dose: 60 Gy
Reference Point Dosage Given to Date: 42 Gy
Reference Point Session Dosage Given: 6 Gy
Session Number: 7

## 2021-07-07 ENCOUNTER — Other Ambulatory Visit: Payer: Self-pay

## 2021-07-07 ENCOUNTER — Telehealth: Payer: Self-pay

## 2021-07-07 ENCOUNTER — Ambulatory Visit
Admission: RE | Admit: 2021-07-07 | Discharge: 2021-07-07 | Disposition: A | Discharge status: Home / Self Care | Payer: Medicare Other | Source: Ambulatory Visit | Attending: Radiation Oncology | Admitting: Radiation Oncology

## 2021-07-07 DIAGNOSIS — C3412 Malignant neoplasm of upper lobe, left bronchus or lung: Secondary | ICD-10-CM | POA: Diagnosis not present

## 2021-07-07 DIAGNOSIS — Z510 Encounter for antineoplastic radiation therapy: Secondary | ICD-10-CM | POA: Diagnosis not present

## 2021-07-07 DIAGNOSIS — Z87891 Personal history of nicotine dependence: Secondary | ICD-10-CM | POA: Diagnosis not present

## 2021-07-07 DIAGNOSIS — R911 Solitary pulmonary nodule: Secondary | ICD-10-CM | POA: Diagnosis not present

## 2021-07-07 LAB — RAD ONC ARIA SESSION SUMMARY
Course Elapsed Days: 10
Plan Fractions Treated to Date: 8
Plan Prescribed Dose Per Fraction: 6 Gy
Plan Total Fractions Prescribed: 10
Plan Total Prescribed Dose: 60 Gy
Reference Point Dosage Given to Date: 48 Gy
Reference Point Session Dosage Given: 6 Gy
Session Number: 8

## 2021-07-07 LAB — URINALYSIS, COMPLETE
Bilirubin, UA: NEGATIVE
Glucose, UA: NEGATIVE
Ketones, UA: NEGATIVE
Nitrite, UA: POSITIVE — AB
RBC, UA: NEGATIVE
Specific Gravity, UA: 1.03 (ref 1.005–1.030)
Urobilinogen, Ur: 0.2 mg/dL (ref 0.2–1.0)
pH, UA: 5.5 (ref 5.0–7.5)

## 2021-07-07 LAB — MICROSCOPIC EXAMINATION

## 2021-07-07 LAB — PSA: Prostate Specific Ag, Serum: 0.2 ng/mL (ref 0.0–4.0)

## 2021-07-07 NOTE — Telephone Encounter (Signed)
-----   Message from Nori Riis, PA-C sent at 07/07/2021  8:06 AM EDT ----- Please let Mr. Gelb know that his PSA was fine at 0.2.

## 2021-07-07 NOTE — Telephone Encounter (Signed)
Patient notified and expressed understanding.

## 2021-07-08 ENCOUNTER — Other Ambulatory Visit: Payer: Self-pay

## 2021-07-08 ENCOUNTER — Ambulatory Visit
Admission: RE | Admit: 2021-07-08 | Discharge: 2021-07-08 | Disposition: A | Discharge status: Home / Self Care | Payer: Medicare Other | Source: Ambulatory Visit | Attending: Radiation Oncology | Admitting: Radiation Oncology

## 2021-07-08 ENCOUNTER — Encounter: Payer: Self-pay | Admitting: Pulmonary Disease

## 2021-07-08 ENCOUNTER — Ambulatory Visit (INDEPENDENT_AMBULATORY_CARE_PROVIDER_SITE_OTHER): Payer: Medicare Other | Admitting: Pulmonary Disease

## 2021-07-08 ENCOUNTER — Ambulatory Visit: Payer: Medicare Other

## 2021-07-08 VITALS — BP 156/70 | HR 75 | Temp 97.8°F | Ht 66.0 in | Wt 169.0 lb

## 2021-07-08 DIAGNOSIS — J449 Chronic obstructive pulmonary disease, unspecified: Secondary | ICD-10-CM | POA: Diagnosis not present

## 2021-07-08 DIAGNOSIS — C3412 Malignant neoplasm of upper lobe, left bronchus or lung: Secondary | ICD-10-CM | POA: Diagnosis not present

## 2021-07-08 DIAGNOSIS — Z510 Encounter for antineoplastic radiation therapy: Secondary | ICD-10-CM | POA: Diagnosis not present

## 2021-07-08 DIAGNOSIS — Z87891 Personal history of nicotine dependence: Secondary | ICD-10-CM | POA: Diagnosis not present

## 2021-07-08 DIAGNOSIS — R911 Solitary pulmonary nodule: Secondary | ICD-10-CM | POA: Diagnosis not present

## 2021-07-08 LAB — RAD ONC ARIA SESSION SUMMARY
Course Elapsed Days: 11
Plan Fractions Treated to Date: 9
Plan Prescribed Dose Per Fraction: 6 Gy
Plan Total Fractions Prescribed: 10
Plan Total Prescribed Dose: 60 Gy
Reference Point Dosage Given to Date: 54 Gy
Reference Point Session Dosage Given: 6 Gy
Session Number: 9

## 2021-07-08 MED ORDER — TRELEGY ELLIPTA 100-62.5-25 MCG/ACT IN AEPB: 1.0000 | INHALATION_SPRAY | Freq: Every day | RESPIRATORY_TRACT | 11 refills | Status: DC

## 2021-07-08 MED ORDER — TRELEGY ELLIPTA 100-62.5-25 MCG/ACT IN AEPB: 1.0000 | INHALATION_SPRAY | Freq: Every day | RESPIRATORY_TRACT | 0 refills | Status: DC

## 2021-07-08 NOTE — Patient Instructions (Signed)
Continue using Trelegy Ellipta 1 puff daily.  Make sure you rinse your mouth well after you use it.  We will see you in follow-up in 4 months time call sooner should any new problems arise.

## 2021-07-08 NOTE — Progress Notes (Signed)
Subjective:    Patient ID: Antonio [name], male    DOB: 1945/08/19, 76 y.o.   MRN: [id number]  Patient Care Team: Doreene Nest, NP as PCP - General (Internal Medicine) Debbe Odea, MD as PCP - Cardiology (Cardiology) Duke Salvia, MD as PCP - Electrophysiology (Cardiology) Hulan Fray as Physician Assistant (Urology) Meryl Dare, MD as Consulting Physician (Gastroenterology) Kathyrn Sheriff, [facility] as Pharmacist (Pharmacist) Glory Buff, RN as Oncology Nurse Navigator  Chief Complaint  Patient presents with   Follow-up    Bronch 05/18/21-prod cough with green to yellow sputum.    HPI This is a 76 year old recent former smoker (quit 4/25, 114 PY) who presents for F/U of a left perihilar lung nodule found during routine lung cancer screening.  Patient has been asymptomatic with regards to this nodule.  He underwent bronchoscopy on 18 May 2021.  Findings on the bronchoscopy were consistent with suspicion of non-small cell carcinoma.  Patient is undergoing SBRT.  He has not had any hemoptysis.  No weight loss or anorexia.  He has chronic morning cough productive of clear to whitish sputum.  This has been going on for a number of years and is unchanged in quality.  He has not had any orthopnea or paroxysmal nocturnal dyspnea.  No chest pain, no lower extremity edema nor calf tenderness.  Previously he had had issues with shortness of breath particularly inclines or lifting heavy objects.  At his initial visit here on 31 March he was provided with Trelegy Ellipta and he notes that this helps him tremendously with the symptoms.  He would like to continue on this medication.  He has not noted any wheezing since he has been on the Trelegy.  He has not had any other symptomatology, overall feels that he is doing better.  Review of Systems A 10 point review of systems was performed and it is as noted above otherwise negative.  Patient Active Problem List    Diagnosis Date Noted   Recurrent non-small cell lung cancer (HCC) 06/30/2022   Mediastinal adenopathy 06/19/2022   Hematoma 05/22/2022   Right leg pain 05/22/2022   Bilateral carotid artery stenosis 04/06/2022   Stroke-like symptoms 04/06/2022   Encephalopathy 04/05/2022   UTI (urinary tract infection) 04/05/2022   History of CVA (cerebrovascular accident) 04/05/2022   Fatigue 12/21/2021   Hearing loss of right ear due to cerumen impaction 10/05/2021   Non-small cell lung cancer (HCC) 06/19/2021   Aortic atherosclerosis (HCC) 06/19/2021   Atrioventricular block, Mobitz type 1, Wenckebach 05/25/2021   Complete heart block (HCC) 05/20/2021   Gastric AVM    Iron deficiency anemia    Right buttock pain 01/12/2020   Fall 01/12/2020   Rash and nonspecific skin eruption 12/19/2019   Chronic pain of right hip 10/17/2019   Ischemic leg 05/29/2018   B12 deficiency 04/03/2018   PAD (peripheral artery disease) (HCC) 12/12/2017   Pain in limb 12/11/2017   GERD (gastroesophageal reflux disease) 04/30/2017   BPH with obstruction/lower urinary tract symptoms 10/14/2014   Essential hypertension 06/26/2014   Type 2 diabetes mellitus with hyperglycemia (HCC) 06/26/2014   HLD (hyperlipidemia) 06/27/2006   Coronary atherosclerosis 06/27/2006   Tobacco abuse 06/27/2006   Social History   Tobacco Use   Smoking status: Former    Packs/day: 2.00    Years: 57.00    Additional pack years: 0.00    Total pack years: 114.00    Types: Cigarettes    Start date:  02/07/1964    Quit date: 05/11/2021    Years since quitting: 1.1   Smokeless tobacco: Former    Types: Chew   Tobacco comments:      Substance Use Topics   Alcohol use: Yes    Comment: rare   Current Meds  Medication Sig   acetaminophen (TYLENOL) 500 MG tablet Take 1,000 mg by mouth every 6 (six) hours as needed (hip pain.). Rapid release   aspirin 81 MG EC tablet Take 81 mg by mouth in the morning.   atorvastatin (LIPITOR) 20 MG tablet  TAKE 1 TABLET BY MOUTH DAILY IN THE EVENING FOR CHOLESTEROL. Office visit required for further refills. (Patient taking differently: Take 20 mg by mouth daily.)   clopidogrel (PLAVIX) 75 MG tablet Take 1 tablet (75 mg total) by mouth daily.   ferrous gluconate (FERGON) 324 MG tablet TAKE 1 TABLET BY MOUTH EVERY DAY WITH BREAKFAST   FIBER PO Take 1 capsule by mouth in the morning.   finasteride (PROSCAR) 5 MG tablet TAKE 1 TABLET BY MOUTH EVERY DAY (Patient taking differently: Take 5 mg by mouth daily.)   Fluticasone-Umeclidin-Vilant (TRELEGY ELLIPTA) 100-62.5-25 MCG/ACT AEPB Inhale 100 mcg into the lungs daily.   glipiZIDE (GLUCOTROL) 10 MG tablet Take 10 mg by mouth daily before supper.    GLUCOSAMINE HCL PO Take 1 tablet by mouth daily.   insulin detemir (LEVEMIR) 100 UNIT/ML injection Inject 20 units at bedtime. (Patient taking differently: Inject 15 Units into the skin daily.)   lisinopril-hydrochlorothiazide (ZESTORETIC) 20-25 MG tablet Take 1 tablet by mouth daily. For blood pressure.   metFORMIN (GLUCOPHAGE) 500 MG tablet TAKE 2 TABLETS BY MOUTH TWICE A DAY WITH MEAL FOR DIABETES (Patient taking differently: Take 1,000 mg by mouth 2 (two) times daily with a meal.)   pantoprazole (PROTONIX) 40 MG tablet Take 1 tablet (40 mg total) by mouth daily. For heartburn   sildenafil (VIAGRA) 100 MG tablet Take 1 tablet (100 mg total) by mouth daily as needed for erectile dysfunction.   tamsulosin (FLOMAX) 0.4 MG CAPS capsule TAKE 1 CAPSULE BY MOUTH 2 TIMES DAILY. (Patient taking differently: Take 0.4 mg by mouth 2 (two) times daily.)   vitamin B-12 (CYANOCOBALAMIN) 1000 MCG tablet Take 1,000 mcg by mouth in the morning.   Immunization History  Administered Date(s) Administered   [employer] Vaccination 07/09/2019, 08/06/2019   Pneumococcal Conjugate-13 09/30/2018   Td 10/08/1994, 11/19/2006        Objective:   Physical Exam BP (!) 156/70 (BP Location: Left Arm, Cuff Size: Normal)    Pulse 75   Temp 97.8 F (36.6 C) (Temporal)   Ht 5\' 6"  (1.676 m)   Wt 169 lb (76.7 kg)   SpO2 98%   BMI 27.28 kg/m   GENERAL: Well-developed, well-nourished gentleman, no acute distress, fully ambulatory. HEAD: Normocephalic, atraumatic.  EYES: Pupils equal, round, reactive to light.  No scleral icterus.  MOUTH: Poor dentition, no thrush NECK: Supple. No thyromegaly. Trachea midline. No JVD.  No adenopathy. PULMONARY: Good air entry bilaterally.  Coarse, otherwise no adventitious sounds. CARDIOVASCULAR: S1 and S2. Regular rate and rhythm.  No rubs, murmurs or gallops heard. ABDOMEN: Benign. MUSCULOSKELETAL: No joint deformity, no clubbing, no edema.  NEUROLOGIC: No overt focal deficit, no gait disturbance, speech is fluent. SKIN: Intact,warm,dry.  On limited exam no rashes PSYCH: Mood and behavior normal.      Assessment & Plan:     ICD-10-CM   1. Malignant neoplasm of upper lobe of left  lung (HCC)  C34.12    Tolerated bronchoscopy SBRT per rad onc    2. COPD suggested by initial evaluation (HCC)  J44.9    Continue Trelegy Will obtain PFTs     Meds ordered this encounter  Medications   Fluticasone-Umeclidin-Vilant (TRELEGY ELLIPTA) 100-62.5-25 MCG/ACT AEPB    Sig: Inhale 1 puff into the lungs daily.    Dispense:  28 each    Refill:  11   Fluticasone-Umeclidin-Vilant (TRELEGY ELLIPTA) 100-62.5-25 MCG/ACT AEPB    Sig: Inhale 1 puff into the lungs daily.    Dispense:  14 each    Refill:  0    Order Specific Question:   Lot Number?    Answer:   [id number]    Order Specific Question:   Expiration Date?    Answer:   11/07/2022    Order Specific Question:   Manufacturer?    Answer:   [employer] [[id number]]    Order Specific Question:   Quantity    Answer:   1

## 2021-07-09 LAB — CULTURE, URINE COMPREHENSIVE

## 2021-07-11 ENCOUNTER — Ambulatory Visit
Admission: RE | Admit: 2021-07-11 | Discharge: 2021-07-11 | Disposition: A | Discharge status: Home / Self Care | Payer: Medicare Other | Source: Ambulatory Visit | Attending: Radiation Oncology | Admitting: Radiation Oncology

## 2021-07-11 ENCOUNTER — Other Ambulatory Visit: Payer: Self-pay

## 2021-07-11 ENCOUNTER — Ambulatory Visit: Payer: Medicare Other

## 2021-07-11 DIAGNOSIS — C3412 Malignant neoplasm of upper lobe, left bronchus or lung: Secondary | ICD-10-CM | POA: Diagnosis not present

## 2021-07-11 DIAGNOSIS — R911 Solitary pulmonary nodule: Secondary | ICD-10-CM | POA: Diagnosis not present

## 2021-07-11 DIAGNOSIS — Z510 Encounter for antineoplastic radiation therapy: Secondary | ICD-10-CM | POA: Diagnosis not present

## 2021-07-11 DIAGNOSIS — Z87891 Personal history of nicotine dependence: Secondary | ICD-10-CM | POA: Diagnosis not present

## 2021-07-11 LAB — RAD ONC ARIA SESSION SUMMARY
Course Elapsed Days: 14
Plan Fractions Treated to Date: 10
Plan Prescribed Dose Per Fraction: 6 Gy
Plan Total Fractions Prescribed: 10
Plan Total Prescribed Dose: 60 Gy
Reference Point Dosage Given to Date: 60 Gy
Reference Point Session Dosage Given: 6 Gy
Session Number: 10

## 2021-07-15 ENCOUNTER — Encounter: Payer: Self-pay | Admitting: Primary Care

## 2021-07-22 ENCOUNTER — Other Ambulatory Visit: Payer: Self-pay | Admitting: Primary Care

## 2021-07-22 DIAGNOSIS — I10 Essential (primary) hypertension: Secondary | ICD-10-CM

## 2021-07-22 DIAGNOSIS — E782 Mixed hyperlipidemia: Secondary | ICD-10-CM

## 2021-07-26 ENCOUNTER — Other Ambulatory Visit: Payer: Self-pay | Admitting: Gastroenterology

## 2021-07-26 DIAGNOSIS — K2990 Gastroduodenitis, unspecified, without bleeding: Secondary | ICD-10-CM

## 2021-08-08 ENCOUNTER — Other Ambulatory Visit (INDEPENDENT_AMBULATORY_CARE_PROVIDER_SITE_OTHER): Payer: Self-pay | Admitting: Nurse Practitioner

## 2021-08-17 ENCOUNTER — Ambulatory Visit
Admission: RE | Admit: 2021-08-17 | Discharge: 2021-08-17 | Disposition: A | Discharge status: Home / Self Care | Payer: Medicare Other | Source: Ambulatory Visit | Attending: Radiation Oncology | Admitting: Radiation Oncology

## 2021-08-17 ENCOUNTER — Encounter: Payer: Self-pay | Admitting: Radiation Oncology

## 2021-08-17 VITALS — BP 133/58 | HR 42 | Temp 97.4°F | Resp 16 | Ht 66.0 in | Wt 169.9 lb

## 2021-08-17 DIAGNOSIS — R911 Solitary pulmonary nodule: Secondary | ICD-10-CM

## 2021-08-18 ENCOUNTER — Other Ambulatory Visit: Payer: Self-pay | Admitting: Gastroenterology

## 2021-08-18 DIAGNOSIS — K2990 Gastroduodenitis, unspecified, without bleeding: Secondary | ICD-10-CM

## 2021-10-05 ENCOUNTER — Ambulatory Visit (INDEPENDENT_AMBULATORY_CARE_PROVIDER_SITE_OTHER): Payer: Medicare Other | Admitting: Nurse Practitioner

## 2021-10-05 VITALS — BP 164/88 | HR 63 | Temp 96.1°F | Resp 12 | Ht 66.0 in | Wt 175.5 lb

## 2021-10-05 DIAGNOSIS — H6121 Impacted cerumen, right ear: Secondary | ICD-10-CM | POA: Diagnosis not present

## 2021-10-05 MED ORDER — FLUTICASONE PROPIONATE 50 MCG/ACT NA SUSP: 2.0000 | Freq: Every day | NASAL | 0 refills | Status: DC

## 2021-10-05 NOTE — Progress Notes (Signed)
Acute Office Visit  Subjective:     Patient ID: Antonio [name], male    DOB: December 07, 1945, 76 y.o.   MRN: [id number]  Chief Complaint  Patient presents with   decreased hearing    X 2 weeks, decreased hearingFeels like he has some scaly skin in his ear, and when he lays on right side he feels fluid in there.    HPI Patient is in today for Ear problem  Patient states that it started approx 2 weeks ago. States that he has had decreased hearing. States that  he had damage with loud noises. Did furntinure and field work  States right greater than left.  Feels like he is in a drum of water. Does use a Q-tip sometimes, states he does not go to date per his report.  Review of Systems  Constitutional:  Negative for chills and fever.  HENT:  Positive for ear pain, hearing loss and sore throat (intermittnet since radiation). Negative for ear discharge.         Objective:    BP (!) 164/88   Pulse 63   Temp (!) 96.1 F (35.6 C) (Temporal)   Resp 12   Ht 5\' 6"  (1.676 m)   Wt 175 lb 8 oz (79.6 kg)   SpO2 97%   BMI 28.33 kg/m    Physical Exam Vitals and nursing note reviewed.  Constitutional:      Appearance: Normal appearance.  HENT:     Right Ear: Ear canal and external ear normal. There is impacted cerumen.     Left Ear: Tympanic membrane, ear canal and external ear normal.     Mouth/Throat:     Mouth: Mucous membranes are moist.     Pharynx: Oropharynx is clear.  Cardiovascular:     Rate and Rhythm: Normal rate and regular rhythm.     Heart sounds: Normal heart sounds.  Pulmonary:     Effort: Pulmonary effort is normal.     Breath sounds: Normal breath sounds.  Lymphadenopathy:     Cervical: No cervical adenopathy.  Neurological:     Mental Status: He is alert.     No results found for any visits on 10/05/21.      Assessment & Plan:   Problem List Items Addressed This Visit       Nervous and Auditory   Hearing loss of right ear due to cerumen impaction  - Primary    Verbal consent obtained.  Patient was prepped with cerumen softening eardrops.  Per office policy a mixture of water and hydroperoxide was used to irrigate patient's ear.  Patient did tolerate procedure well.  Was unable to remove cerumen impaction due to the dryness of cerumen.  Patient was instructed to get over-the-counter cerumen softening eardrops use them for approximately 3 days and make an office visit for [place] to try again.  If we are unable to remove impaction at that point we will refer patient to ENT.      Relevant Medications   fluticasone (FLONASE) 50 MCG/ACT nasal spray   Other Relevant Orders   Ear Lavage    Meds ordered this encounter  Medications   fluticasone (FLONASE) 50 MCG/ACT nasal spray    Sig: Place 2 sprays into both nostrils daily.    Dispense:  16 g    Refill:  0    Order Specific Question:   Supervising Provider    Answer:   Loura Pardon A [[id number]]    Return in about  3 days (around 10/08/2021) for Cerumen impaction.  Romilda Garret, NP

## 2021-10-05 NOTE — Patient Instructions (Signed)
Nice to see you today  Get over the counter ear wax softening ear drops use it for 3 days and then make an appointment for [place] to try again  Also start using the flonase nasal spray

## 2021-10-05 NOTE — Assessment & Plan Note (Signed)
Verbal consent obtained.  Patient was prepped with cerumen softening eardrops.  Per office policy a mixture of water and hydroperoxide was used to irrigate patient's ear.  Patient did tolerate procedure well.  Was unable to remove cerumen impaction due to the dryness of cerumen.  Patient was instructed to get over-the-counter cerumen softening eardrops use them for approximately 3 days and make an office visit for [place] to try again.  If we are unable to remove impaction at that point we will refer patient to ENT.

## 2021-10-12 ENCOUNTER — Ambulatory Visit: Payer: Medicare Other | Admitting: Nurse Practitioner

## 2021-10-20 ENCOUNTER — Ambulatory Visit (INDEPENDENT_AMBULATORY_CARE_PROVIDER_SITE_OTHER): Payer: Medicare Other | Admitting: Nurse Practitioner

## 2021-10-20 VITALS — BP 184/94 | HR 80 | Temp 96.5°F | Resp 14 | Ht 66.0 in | Wt 178.2 lb

## 2021-10-20 DIAGNOSIS — H6121 Impacted cerumen, right ear: Secondary | ICD-10-CM | POA: Diagnosis not present

## 2021-10-20 DIAGNOSIS — I10 Essential (primary) hypertension: Secondary | ICD-10-CM | POA: Diagnosis not present

## 2021-10-20 NOTE — Assessment & Plan Note (Signed)
Patient states he is taking medications as prescribed.  Last office blood pressure elevated.  Elevated blood pressure today at beginning of office visit and after irrigation.  Patient informed to check blood pressure 2-3 times a week at home if numbers 478 or above systolic or 90 or above diastolic frequently to recheck on scheduled appoint with primary care provider

## 2021-10-20 NOTE — Progress Notes (Addendum)
Established Patient Office Visit  Subjective   Patient ID: Antonio [name], male    DOB: [birth year]  Age: 76 y.o. MRN: [id number]  Chief Complaint  Patient presents with   Cerumen Impaction    Follow up on right ear    HPI  Cerumen Impaction: Was seen by me in office on 10/05/2021 for hearing loss that is assoicated with cerumen impaction. Attempted to disimpact and was unsuccessful. Patient returns today for follow and another attempt. States that he did use over the counter drops like directed .States that the flonase did help some. States that he is still having diffcultly hearing . No pain     Review of Systems  Constitutional:  Negative for chills and fever.  HENT:  Positive for hearing loss. Negative for ear discharge and ear pain.       Objective:     BP (!) 184/94   Pulse 80   Temp (!) 96.5 F (35.8 C)   Resp 14   Ht 5\' 6"  (1.676 m)   Wt 178 lb 4 oz (80.9 kg)   SpO2 95%   BMI 28.77 kg/m  BP Readings from Last 3 Encounters:  10/20/21 (!) 184/94  10/05/21 (!) 164/88  08/17/21 (!) 133/58   Wt Readings from Last 3 Encounters:  10/20/21 178 lb 4 oz (80.9 kg)  10/05/21 175 lb 8 oz (79.6 kg)  08/17/21 169 lb 14.4 oz (77.1 kg)      Physical Exam Vitals and nursing note reviewed.  Constitutional:      Appearance: Normal appearance. He is obese.  HENT:     Right Ear: Ear canal and external ear normal. There is impacted cerumen.     Left Ear: Tympanic membrane, ear canal and external ear normal.  Cardiovascular:     Rate and Rhythm: Normal rate and regular rhythm.     Heart sounds: Normal heart sounds.  Pulmonary:     Effort: Pulmonary effort is normal.     Breath sounds: Normal breath sounds.  Neurological:     Mental Status: He is alert.      No results found for any visits on 10/20/21.    The ASCVD Risk score (Arnett DK, et al., 2019) failed to calculate for the following reasons:   The valid total cholesterol range is 130 to 320 mg/dL     Assessment & Plan:   Problem List Items Addressed This Visit       Cardiovascular and Mediastinum   Essential hypertension    Patient states he is taking medications as prescribed.  Last office blood pressure elevated.  Elevated blood pressure today at beginning of office visit and after irrigation.  Patient informed to check blood pressure 2-3 times a week at home if numbers 481 or above systolic or 90 or above diastolic frequently to recheck on scheduled appoint with primary care provider        Nervous and Auditory   Hearing loss of right ear due to cerumen impaction - Primary    Patient did over-the-counter cerumen softening drops as requested.  Came in today unable to remove disimpaction of the right ear.  Patient's right ear canal is very narrow it was like is dried it up against TM.  Ambulatory referral placed to ENT in [place].  Verbal consent obtained.  Patient was prepped using office policy.  Water and hydroperoxide was used in right ear irrigated.  Patient tolerated procedure well.  Disimpaction unsuccessful.  Referral to ENT  Relevant Orders   Ambulatory referral to ENT    Return if symptoms worsen or fail to improve.    Romilda Garret, NP

## 2021-10-20 NOTE — Patient Instructions (Signed)
Nice to see you today I have referred you to the Ear, Nose, and Throat doctors. They should get in touch with you in the next 2 weeks  Check your blood pressure at home 2-3 times a week and if they number are 140+ on top or 90+ on the bottom consistently you need to see Antonio [name] and talk about the numbers

## 2021-10-20 NOTE — Assessment & Plan Note (Addendum)
Patient did over-the-counter cerumen softening drops as requested.  Came in today unable to remove disimpaction of the right ear.  Patient's right ear canal is very narrow it was like is dried it up against TM.  Ambulatory referral placed to ENT in [place].  Verbal consent obtained.  Patient was prepped using office policy.  Water and hydroperoxide was used in right ear irrigated.  Patient tolerated procedure well.  Disimpaction unsuccessful.  Referral to ENT

## 2021-10-27 ENCOUNTER — Other Ambulatory Visit: Payer: Self-pay | Admitting: Nurse Practitioner

## 2021-10-27 DIAGNOSIS — H6121 Impacted cerumen, right ear: Secondary | ICD-10-CM

## 2021-10-31 DIAGNOSIS — H6121 Impacted cerumen, right ear: Secondary | ICD-10-CM | POA: Diagnosis not present

## 2021-10-31 DIAGNOSIS — H902 Conductive hearing loss, unspecified: Secondary | ICD-10-CM | POA: Diagnosis not present

## 2021-11-04 ENCOUNTER — Ambulatory Visit (INDEPENDENT_AMBULATORY_CARE_PROVIDER_SITE_OTHER): Payer: Medicare Other | Admitting: Pulmonary Disease

## 2021-11-04 ENCOUNTER — Encounter: Payer: Self-pay | Admitting: Pulmonary Disease

## 2021-11-04 ENCOUNTER — Ambulatory Visit: Payer: Medicare Other | Admitting: Pulmonary Disease

## 2021-11-04 VITALS — BP 176/90 | HR 66 | Temp 97.9°F | Ht 67.0 in | Wt 180.2 lb

## 2021-11-04 DIAGNOSIS — C3412 Malignant neoplasm of upper lobe, left bronchus or lung: Secondary | ICD-10-CM

## 2021-11-04 DIAGNOSIS — F17210 Nicotine dependence, cigarettes, uncomplicated: Secondary | ICD-10-CM

## 2021-11-04 DIAGNOSIS — J449 Chronic obstructive pulmonary disease, unspecified: Secondary | ICD-10-CM

## 2021-11-04 MED ORDER — TRELEGY ELLIPTA 100-62.5-25 MCG/ACT IN AEPB
1.0000 | INHALATION_SPRAY | Freq: Every day | RESPIRATORY_TRACT | 11 refills | Status: DC
Start: 2021-11-04 — End: 2022-06-12

## 2021-11-04 MED ORDER — TRELEGY ELLIPTA 100-62.5-25 MCG/ACT IN AEPB: 1.0000 | INHALATION_SPRAY | Freq: Every day | RESPIRATORY_TRACT | 0 refills | Status: DC

## 2021-11-04 NOTE — Patient Instructions (Addendum)
We have refilled your Trelegy.  You have enough refills in the pharmacy.  We also provided you with a coupon that we will give you 1 month of it free.  We going to get some breathing tests.  We will see you in follow-up in 4 months time call sooner should any new problems arise.

## 2021-11-04 NOTE — Progress Notes (Signed)
Subjective:    Patient ID: Antonio [name], male    DOB: 05-Mar-1945, 76 y.o.   MRN: [id number] Patient Care Team: Doreene Nest, NP as PCP - General (Internal Medicine) Debbe Odea, MD as PCP - Cardiology (Cardiology) Hulan Fray as Physician Assistant (Urology) Meryl Dare, MD as Consulting Physician (Gastroenterology) Phil Dopp, [facility] as Pharmacist (Pharmacist)  Chief Complaint  Patient presents with   Follow-up    C/o prod cough with clear to yellow sputum.   HPI This is a 76 year old recent former smoker (quit 4/25, 114 PY) who presents for F/U of a left perihilar lung nodule found during routine lung cancer screening and COPD.  The patient was last seen here on 08 July 2021.  Recall he underwent robotic assisted bronchoscopy on 18 May 2021.  Findings on the bronchoscopy were consistent with suspicion of non-small cell carcinoma.  Patient underwent SBRT to the lesion.  He has not had any hemoptysis.  No weight loss or anorexia.  He has chronic morning cough productive of clear to whitish sputum.  This has been going on for a number of years and is unchanged in quality.  He has not had any orthopnea or paroxysmal nocturnal dyspnea.  No chest pain, no lower extremity edema nor calf tenderness.  Previously he had had issues with shortness of breath particularly inclines or lifting heavy objects.  He has been maintained on Trelegy Ellipta and he notes that this helps him tremendously with the symptoms.  He would like to continue on this medication.  He has not noted any wheezing since he has been on the Trelegy.  He has not had any other symptomatology, overall feels that he is doing better.  He needs to have COPD quantitated and will need PFTs.   Review of Systems A 10 point review of systems was performed and it is as noted above otherwise negative.  Patient Active Problem List   Diagnosis Date Noted   Hearing loss of right ear due to cerumen impaction  10/05/2021   Atrioventricular block, Mobitz type 1, Wenckebach 05/25/2021   Complete heart block (HCC) 05/20/2021   Gastric AVM    Iron deficiency anemia    Right buttock pain 01/12/2020   Fall at home, initial encounter 01/12/2020   Rash and nonspecific skin eruption 12/19/2019   Chronic pain of right hip 10/17/2019   Ischemic leg 05/29/2018   B12 deficiency 04/03/2018   PAD (peripheral artery disease) (HCC) 12/12/2017   Pain in limb 12/11/2017   GERD (gastroesophageal reflux disease) 04/30/2017   BPH with obstruction/lower urinary tract symptoms 10/14/2014   Essential hypertension 06/26/2014   Diabetes mellitus type 2, controlled (HCC) 06/26/2014   HLD (hyperlipidemia) 06/27/2006   Coronary atherosclerosis 06/27/2006   Tobacco abuse 06/27/2006   Social History   Tobacco Use   Smoking status: Former    Packs/day: 2.00    Years: 57.00    Total pack years: 114.00    Types: Cigarettes    Quit date: 05/11/2021    Years since quitting: 0.4   Smokeless tobacco: Former    Types: Chew   Tobacco comments:    0.5 PPD since 04/20/2021  Substance Use Topics   Alcohol use: Not Currently    Alcohol/week: 0.0 standard drinks of alcohol    Comment: rare   No Known Allergies  Current Meds  Medication Sig   acetaminophen (TYLENOL) 500 MG tablet Take 1,000 mg by mouth every 6 (six) hours as needed (hip  pain.). Rapid release   aspirin 81 MG EC tablet Take 81 mg by mouth in the morning.   atorvastatin (LIPITOR) 20 MG tablet TAKE 1 TABLET BY MOUTH IN THE EVENING FOR CHOLESTEROL   clopidogrel (PLAVIX) 75 MG tablet TAKE 1 TABLET BY MOUTH EVERY DAY   ferrous gluconate (FERGON) 324 MG tablet TAKE 1 TABLET BY MOUTH EVERY DAY WITH BREAKFAST   FIBER PO Take 1 capsule by mouth in the morning.   finasteride (PROSCAR) 5 MG tablet TAKE 1 TABLET BY MOUTH EVERY DAY (Patient taking differently: Take 5 mg by mouth daily.)   fluticasone (FLONASE) 50 MCG/ACT nasal spray SPRAY 2 SPRAYS INTO EACH NOSTRIL  EVERY DAY   Fluticasone-Umeclidin-Vilant (TRELEGY ELLIPTA) 100-62.5-25 MCG/ACT AEPB Inhale 1 puff into the lungs daily.   glipiZIDE (GLUCOTROL) 10 MG tablet Take 10 mg by mouth daily before supper.    GLUCOSAMINE HCL PO Take 1 tablet by mouth daily.   insulin detemir (LEVEMIR) 100 UNIT/ML injection Inject 20 units at bedtime. (Patient taking differently: Inject 15 Units into the skin daily.)   lisinopril-hydrochlorothiazide (ZESTORETIC) 20-25 MG tablet Take 1 tablet by mouth daily. For blood pressure.   metFORMIN (GLUCOPHAGE) 500 MG tablet TAKE 2 TABLETS BY MOUTH TWICE A DAY WITH MEAL FOR DIABETES (Patient taking differently: Take 1,000 mg by mouth 2 (two) times daily with a meal.)   pantoprazole (PROTONIX) 40 MG tablet Take 1 tablet (40 mg total) by mouth daily. For heartburn   sildenafil (VIAGRA) 100 MG tablet Take 1 tablet (100 mg total) by mouth daily as needed for erectile dysfunction.   tamsulosin (FLOMAX) 0.4 MG CAPS capsule TAKE 1 CAPSULE BY MOUTH 2 TIMES DAILY. (Patient taking differently: Take 0.4 mg by mouth 2 (two) times daily.)   vitamin B-12 (CYANOCOBALAMIN) 1000 MCG tablet Take 1,000 mcg by mouth in the morning.   Immunization History  Administered Date(s) Administered   [employer] Vaccination 07/09/2019, 08/06/2019   Pneumococcal Conjugate-13 09/30/2018   Td 10/08/1994, 11/19/2006        Objective:   Physical Exam BP (!) 176/90 (BP Location: Left Arm, Cuff Size: Normal)   Pulse 66   Temp 97.9 F (36.6 C) (Temporal)   Ht 5\' 7"  (1.702 m)   Wt 180 lb 3.2 oz (81.7 kg)   SpO2 98%   BMI 28.22 kg/m   SpO2: 98 % O2 Device: None (Room air)  GENERAL: Well-developed, well-nourished gentleman, no acute distress, fully ambulatory. HEAD: Normocephalic, atraumatic.  EYES: Pupils equal, round, reactive to light.  No scleral icterus.  MOUTH: Poor dentition, no thrush NECK: Supple. No thyromegaly. Trachea midline. No JVD.  No adenopathy. PULMONARY: Good air entry  bilaterally.  Coarse, otherwise no adventitious sounds. CARDIOVASCULAR: S1 and S2. Regular rate and rhythm.  No rubs, murmurs or gallops heard. ABDOMEN: Benign. MUSCULOSKELETAL: No joint deformity, no clubbing, no edema.  NEUROLOGIC: No overt focal deficit, no gait disturbance, speech is fluent. SKIN: Intact,warm,dry.  On limited exam no rashes PSYCH: Mood and behavior normal.      Assessment & Plan:     ICD-10-CM   1. COPD suggested by initial evaluation [facility])  J44.9 Pulmonary Function Test [facility] Only   Will obtain PFTs Continue Trelegy Ellipta Continue as needed albuterol    2. Malignant neoplasm of upper lobe of left lung (HCC)  C34.12    Completed SBRT Continue observational protocol    3. Tobacco dependence due to cigarettes  F17.210    Patient counseled regarding discontinuation of smoking Oral counseling  time 3 to 5 minutes     Orders Placed This Encounter  Procedures   Pulmonary Function Test [facility] Only    Standing Status:   Future    Standing Expiration Date:   11/05/2022    Order Specific Question:   Full PFT: includes the following: basic spirometry, spirometry pre & post bronchodilator, diffusion capacity (DLCO), lung volumes    Answer:   Full PFT    Order Specific Question:   This test can only be performed at    Answer:   [facility]   Meds ordered this encounter  Medications   Fluticasone-Umeclidin-Vilant (TRELEGY ELLIPTA) 100-62.5-25 MCG/ACT AEPB    Sig: Inhale 1 puff into the lungs daily.    Dispense:  28 each    Refill:  11   Fluticasone-Umeclidin-Vilant (TRELEGY ELLIPTA) 100-62.5-25 MCG/ACT AEPB    Sig: Inhale 1 puff into the lungs daily.    Dispense:  14 each    Refill:  0    Order Specific Question:   Lot Number?    Answer:   [id number]    Order Specific Question:   Expiration Date?    Answer:   03/10/2023    Order Specific Question:   Manufacturer?    Answer:   [employer] [[id number]]    Order Specific Question:   Quantity    Answer:   1   Will  see the patient in follow-up in 4 months time he is to contact us prior to that time should any new difficulties arise.  Gailen Shelter, MD Advanced Bronchoscopy PCCM [facility]-    *This note was dictated using voice recognition software/Dragon.  Despite best efforts to proofread, errors can occur which can change the meaning. Any transcriptional errors that result from this process are unintentional and may not be fully corrected at the time of dictation.

## 2021-11-04 NOTE — Telephone Encounter (Signed)
Pt's appt was rescheduled.

## 2021-11-07 ENCOUNTER — Ambulatory Visit
Admission: RE | Admit: 2021-11-07 | Discharge: 2021-11-07 | Disposition: A | Discharge status: Home / Self Care | Payer: Medicare Other | Source: Ambulatory Visit | Attending: Radiation Oncology | Admitting: Radiation Oncology

## 2021-11-07 DIAGNOSIS — R911 Solitary pulmonary nodule: Secondary | ICD-10-CM | POA: Diagnosis not present

## 2021-11-07 DIAGNOSIS — J479 Bronchiectasis, uncomplicated: Secondary | ICD-10-CM | POA: Diagnosis not present

## 2021-11-07 LAB — POCT I-STAT CREATININE: Creatinine, Ser: 0.9 mg/dL (ref 0.61–1.24)

## 2021-11-07 MED ORDER — IOHEXOL 300 MG/ML  SOLN
75.0000 mL | Freq: Once | INTRAMUSCULAR | Status: AC | PRN
  Administered 2021-11-07: 75 mL via INTRAVENOUS

## 2021-11-17 ENCOUNTER — Encounter: Payer: Self-pay | Admitting: Radiation Oncology

## 2021-11-17 ENCOUNTER — Other Ambulatory Visit: Payer: Self-pay | Admitting: *Deleted

## 2021-11-17 ENCOUNTER — Ambulatory Visit
Admission: RE | Admit: 2021-11-17 | Discharge: 2021-11-17 | Disposition: A | Discharge status: Home / Self Care | Payer: Medicare Other | Source: Ambulatory Visit | Attending: Radiation Oncology | Admitting: Radiation Oncology

## 2021-11-17 VITALS — BP 156/77 | HR 66 | Temp 97.3°F | Resp 20 | Ht 66.0 in | Wt 179.2 lb

## 2021-11-17 DIAGNOSIS — C3412 Malignant neoplasm of upper lobe, left bronchus or lung: Secondary | ICD-10-CM | POA: Diagnosis not present

## 2021-11-17 DIAGNOSIS — R911 Solitary pulmonary nodule: Secondary | ICD-10-CM

## 2021-11-17 DIAGNOSIS — Z87891 Personal history of nicotine dependence: Secondary | ICD-10-CM | POA: Diagnosis not present

## 2021-11-17 NOTE — Progress Notes (Signed)
Radiation Oncology Follow up Note  Name: Antonio [name]   Date:   11/17/2021 MRN:  [id number] DOB: 1946-01-10    This 76 y.o. male presents to the clinic today for 46-month follow-up status post SBRT to his left upper lobe for stage I non-small cell lung cancer.  REFERRING PROVIDER: Pleas Koch, NP  HPI: Patient is a 76 year old male now at 4 months having completed SBRT to his left upper lobe for stage I non-small cell lung cancer.  Clinically he is doing well.  He specifically denies any change in his pulmonary status does have a mild nonproductive cough no chest tightness.  He had a CT scan recently.  Showing previous noted prior perihilar left upper lobe pulmonary nodule which is not apparent on today's examination.  There are borderline enlarged left hilar nodes nonspecific.  No other new new pulmonary nodules or masses are noted.  He did have severe circumferential thickening of the distal esophagus.  Patient did have a (upper endoscopy at less than a year ago showing no lesions in his distal esophagus.  He continues under the care of GI.  COMPLICATIONS OF TREATMENT: none  FOLLOW UP COMPLIANCE: keeps appointments   PHYSICAL EXAM:  BP (!) 156/77 (BP Location: Left Arm, Patient Position: Sitting, Cuff Size: Normal)   Pulse 66   Temp (!) 97.3 F (36.3 C) (Tympanic)   Resp 20   Ht 5\' 6"  (1.676 m)   Wt 179 lb 3.2 oz (81.3 kg)   BMI 28.92 kg/m  Well-developed well-nourished patient in NAD. HEENT reveals PERLA, EOMI, discs not visualized.  Oral cavity is clear. No oral mucosal lesions are identified. Neck is clear without evidence of cervical or supraclavicular adenopathy. Lungs are clear to A&P. Cardiac examination is essentially unremarkable with regular rate and rhythm without murmur rub or thrill. Abdomen is benign with no organomegaly or masses noted. Motor sensory and DTR levels are equal and symmetric in the upper and lower extremities. Cranial nerves II through XII are grossly  intact. Proprioception is intact. No peripheral adenopathy or edema is identified. No motor or sensory levels are noted. Crude visual fields are within normal range.  RADIOLOGY RESULTS: CT scans reviewed compatible with above-stated findings  PLAN: Present time patient is doing well excellent response to SBRT treatment.  Of asked to see him back in 6 months for follow-up with a repeat CT scan.  Patient is to call with any concerns.  He continues follow-up care with GI.  I would like to take this opportunity to thank you for allowing me to participate in the care of your patient.Noreene Filbert, MD

## 2021-11-21 ENCOUNTER — Telehealth: Payer: Self-pay

## 2021-11-21 NOTE — Progress Notes (Signed)
Chronic Care Management Pharmacy Assistant   Name: Antonio [name]  MRN: [id number] DOB: [birth year]  Reason for Encounter: CCM (General Adherence)  Recent office visits:  None since last CCM contact  Recent consult visits:  11/17/21 Antonio Filbert, MD (Oncology) Lung Cancer No other information 11/07/21 CT Chest w/contrast 11/04/21 Antonio Gambles, MD (Cardiology) COPD Ordered: Pulmonary Function Test [facility] FU 4 months 10/20/21 Antonio Ito, NP Cerumen Impaction Referral: ENT Procedure: Ear Lavage 10/05/21 Antonio Ito, NP Decreased Hearing Start: Antonio [name]) 100-62.5-25 MCG/ACT Procedure: Ear Lavage 08/17/21 CT Chest w/contrast 07/08/21 Antonio Gambles, MD (Pulmonology) Start: Antonio [name]) 100-62.5-25 MCG/ACT FU 4 months 07/06/21 Antonio Council, PA (Urology) Asymptomatic bacteriuria " PSA was fine at 0.2" No med changes  Hospital visits:  None in previous 6 months  Medications: Outpatient Encounter Medications as of 11/21/2021  Medication Sig   acetaminophen (TYLENOL) 500 MG tablet Take 1,000 mg by mouth every 6 (six) hours as needed (hip pain.). Rapid release   aspirin 81 MG EC tablet Take 81 mg by mouth in the morning.   atorvastatin (LIPITOR) 20 MG tablet TAKE 1 TABLET BY MOUTH IN THE EVENING FOR CHOLESTEROL   clopidogrel (PLAVIX) 75 MG tablet TAKE 1 TABLET BY MOUTH EVERY [name]   ferrous gluconate (FERGON) 324 MG tablet TAKE 1 TABLET BY MOUTH EVERY [name] WITH BREAKFAST   FIBER PO Take 1 capsule by mouth in the morning.   finasteride (PROSCAR) 5 MG tablet TAKE 1 TABLET BY MOUTH EVERY [name] (Patient taking differently: Take 5 mg by mouth daily.)   fluticasone (FLONASE) 50 MCG/ACT nasal spray SPRAY 2 SPRAYS INTO EACH NOSTRIL EVERY [name]   Fluticasone-Umeclidin-Vilant (TRELEGY ELLIPTA) 100-62.5-25 MCG/ACT AEPB Inhale 1 puff into the lungs daily.   Fluticasone-Umeclidin-Vilant (TRELEGY ELLIPTA) 100-62.5-25 MCG/ACT AEPB Inhale 1 puff into the lungs daily.   glipiZIDE (GLUCOTROL)  10 MG tablet Take 10 mg by mouth daily before supper.    GLUCOSAMINE HCL PO Take 1 tablet by mouth daily.   insulin detemir (LEVEMIR) 100 UNIT/ML injection Inject 20 units at bedtime. (Patient taking differently: Inject 15 Units into the skin daily.)   lisinopril-hydrochlorothiazide (ZESTORETIC) 20-25 MG tablet Take 1 tablet by mouth daily. For blood pressure.   metFORMIN (GLUCOPHAGE) 500 MG tablet TAKE 2 TABLETS BY MOUTH TWICE A [name] WITH MEAL FOR DIABETES (Patient taking differently: Take 1,000 mg by mouth 2 (two) times daily with a meal.)   pantoprazole (PROTONIX) 40 MG tablet Take 1 tablet (40 mg total) by mouth daily. For heartburn   sildenafil (VIAGRA) 100 MG tablet Take 1 tablet (100 mg total) by mouth daily as needed for erectile dysfunction.   tamsulosin (FLOMAX) 0.4 MG CAPS capsule TAKE 1 CAPSULE BY MOUTH 2 TIMES DAILY. (Patient taking differently: Take 0.4 mg by mouth 2 (two) times daily.)   vitamin B-12 (CYANOCOBALAMIN) 1000 MCG tablet Take 1,000 mcg by mouth in the morning.   No facility-administered encounter medications on file as of 11/21/2021.    Attempted contact with patient 3 times. Unsuccessful outreach. Will atttempt contact next month.  Patient is not more than 5 days past due for refill on the following medications per chart history:  [facility] Medications: Medication Name/mg Last Fill Days Supply Atorvastatin 20 mg  10/23/21 90 Glipizide 10 mg  10/10/21 90 Lisinopril-HCTZ 20-25 mg 09/01/21 90 Metformin 500 mg  11/07/21 90  Care Gaps: Annual wellness visit in last year? Yes 01/20/21 Most Recent BP reading: 156/77 on 11/17/21  If Diabetic: Most recent A1C reading: 7.4  on 05/12/21 Last eye exam / retinopathy screening: Up to date Last diabetic foot exam: 05/16/2019  Summary of recommendations from last [place] visit (Date:03/28/21 by [name], [facility])  Summary: CCM 6 month follow up visit. Discussed adherence - All refills are timely. He is using a pillbox.  No cost concerns. Discussed HTN. He monitors at home on occasion. Last readings - 118/51, 136/58. Uses wrist cuff. DM, controlled A1c 6.8% followed by Dr. Gabriel Carina. Tobacco use, he reports having cut back to < 1/2 PPD. Congratulated. He continues Zyban BID. He is due for AWV. He will call this week to schedule with PCP.   Recommendation: Schedule follow up with PCP    Plan: CCM PharmD follow up - 12 months [place] adherence review - 3 months  Upcoming appointments: Cardiology appointment on 01/05/22 PCP appointment for AWV on 01/24/22 CCM appointment on 03/29/21  Charlene Brooke, CPP notified  Marijean Niemann, [place] Assistant [phone]

## 2021-12-05 ENCOUNTER — Encounter (INDEPENDENT_AMBULATORY_CARE_PROVIDER_SITE_OTHER): Payer: Self-pay

## 2021-12-14 ENCOUNTER — Telehealth: Payer: Self-pay

## 2021-12-14 ENCOUNTER — Emergency Department: Payer: Medicare Other

## 2021-12-14 ENCOUNTER — Encounter: Payer: Self-pay | Admitting: Emergency Medicine

## 2021-12-14 ENCOUNTER — Other Ambulatory Visit: Payer: Self-pay

## 2021-12-14 ENCOUNTER — Emergency Department
Admission: EM | Admit: 2021-12-14 | Discharge: 2021-12-14 | Disposition: A | Discharge status: Home / Self Care | Payer: Medicare Other | Attending: Emergency Medicine | Admitting: Emergency Medicine

## 2021-12-14 DIAGNOSIS — N3000 Acute cystitis without hematuria: Secondary | ICD-10-CM | POA: Insufficient documentation

## 2021-12-14 DIAGNOSIS — R509 Fever, unspecified: Secondary | ICD-10-CM | POA: Diagnosis not present

## 2021-12-14 DIAGNOSIS — E860 Dehydration: Secondary | ICD-10-CM | POA: Diagnosis not present

## 2021-12-14 DIAGNOSIS — I10 Essential (primary) hypertension: Secondary | ICD-10-CM | POA: Diagnosis not present

## 2021-12-14 DIAGNOSIS — Z1152 Encounter for screening for COVID-19: Secondary | ICD-10-CM | POA: Diagnosis not present

## 2021-12-14 DIAGNOSIS — R5381 Other malaise: Secondary | ICD-10-CM | POA: Insufficient documentation

## 2021-12-14 DIAGNOSIS — E1165 Type 2 diabetes mellitus with hyperglycemia: Secondary | ICD-10-CM | POA: Insufficient documentation

## 2021-12-14 DIAGNOSIS — R42 Dizziness and giddiness: Secondary | ICD-10-CM | POA: Diagnosis not present

## 2021-12-14 DIAGNOSIS — R5383 Other fatigue: Secondary | ICD-10-CM | POA: Insufficient documentation

## 2021-12-14 DIAGNOSIS — M791 Myalgia, unspecified site: Secondary | ICD-10-CM | POA: Insufficient documentation

## 2021-12-14 LAB — URINALYSIS, ROUTINE W REFLEX MICROSCOPIC
Bilirubin Urine: NEGATIVE
Glucose, UA: NEGATIVE mg/dL
Ketones, ur: NEGATIVE mg/dL
Nitrite: NEGATIVE
Protein, ur: 100 mg/dL — AB
Specific Gravity, Urine: 1.023 (ref 1.005–1.030)
WBC, UA: >50 WBC/hpf — ABNORMAL HIGH (ref 0–5)
pH: 5 (ref 5.0–8.0)

## 2021-12-14 LAB — CBC WITH DIFFERENTIAL/PLATELET
Abs Immature Granulocytes: 0.06 10*3/uL (ref 0.00–0.07)
Basophils Absolute: 0 10*3/uL (ref 0.0–0.1)
Basophils Relative: 1 %
Eosinophils Absolute: 0 10*3/uL (ref 0.0–0.5)
Eosinophils Relative: 0 %
HCT: 43.1 % (ref 39.0–52.0)
Hemoglobin: 14.1 g/dL (ref 13.0–17.0)
Immature Granulocytes: 1 %
Lymphocytes Relative: 9 %
Lymphs Abs: 0.4 10*3/uL — ABNORMAL LOW (ref 0.7–4.0)
MCH: 29.2 pg (ref 26.0–34.0)
MCHC: 32.7 g/dL (ref 30.0–36.0)
MCV: 89.2 fL (ref 80.0–100.0)
Monocytes Absolute: 0.2 10*3/uL (ref 0.1–1.0)
Monocytes Relative: 5 %
Neutro Abs: 4.1 10*3/uL (ref 1.7–7.7)
Neutrophils Relative %: 84 %
Platelets: 148 10*3/uL — ABNORMAL LOW (ref 150–400)
RBC: 4.83 MIL/uL (ref 4.22–5.81)
RDW: 14.2 % (ref 11.5–15.5)
WBC: 4.9 10*3/uL (ref 4.0–10.5)
nRBC: 0 % (ref 0.0–0.2)

## 2021-12-14 LAB — COMPREHENSIVE METABOLIC PANEL
ALT: 23 U/L (ref 0–44)
AST: 37 U/L (ref 15–41)
Albumin: 3.6 g/dL (ref 3.5–5.0)
Alkaline Phosphatase: 48 U/L (ref 38–126)
Anion gap: 10 (ref 5–15)
BUN: 34 mg/dL — ABNORMAL HIGH (ref 8–23)
CO2: 21 mmol/L — ABNORMAL LOW (ref 22–32)
Calcium: 9 mg/dL (ref 8.9–10.3)
Chloride: 105 mmol/L (ref 98–111)
Creatinine, Ser: 1.61 mg/dL — ABNORMAL HIGH (ref 0.61–1.24)
GFR, Estimated: 44 mL/min — ABNORMAL LOW (ref >60)
Glucose, Bld: 326 mg/dL — ABNORMAL HIGH (ref 70–99)
Potassium: 4.3 mmol/L (ref 3.5–5.1)
Sodium: 136 mmol/L (ref 135–145)
Total Bilirubin: 0.9 mg/dL (ref 0.3–1.2)
Total Protein: 7 g/dL (ref 6.5–8.1)

## 2021-12-14 LAB — SARS CORONAVIRUS 2 BY RT PCR: SARS Coronavirus 2 by RT PCR: NEGATIVE

## 2021-12-14 LAB — LACTIC ACID, PLASMA
Lactic Acid, Venous: 3.3 mmol/L (ref 0.5–1.9)
Lactic Acid, Venous: 3.4 mmol/L (ref 0.5–1.9)

## 2021-12-14 MED ORDER — LACTATED RINGERS IV BOLUS
1000.0000 mL | Freq: Once | INTRAVENOUS | Status: AC
  Administered 2021-12-14: 1000 mL via INTRAVENOUS

## 2021-12-14 MED ORDER — CEPHALEXIN 500 MG PO CAPS
500.0000 mg | ORAL_CAPSULE | Freq: Once | ORAL | Status: AC
  Administered 2021-12-14: 500 mg via ORAL
  Filled 2021-12-14: qty 1

## 2021-12-14 MED ORDER — CEPHALEXIN 500 MG PO CAPS: 500.0000 mg | ORAL_CAPSULE | Freq: Two times a day (BID) | ORAL | 0 refills | Status: DC

## 2021-12-14 NOTE — ED Triage Notes (Signed)
Patient to ED for fever along with chills, high blood sugar, lack of appetite, and unsteady on his feet. Patient states they symptoms have been intermittent for the past 2 days. Ambulatory with cane to triage. NAD noted.

## 2021-12-14 NOTE — ED Notes (Signed)
   12/14/21 1248  Orthostatic Lying   BP- Lying 137/60  Pulse- Lying 56  Orthostatic Sitting  BP- Sitting 121/73  Pulse- Sitting 71  Orthostatic Standing at 0 minutes  BP- Standing at 0 minutes 135/63  Pulse- Standing at 0 minutes 60

## 2021-12-14 NOTE — ED Provider Notes (Addendum)
[facility] Provider Note    Event Date/Time   First MD Initiated Contact with Patient 12/14/21 [phone]     (approximate)   History   Chief Complaint: Fever   HPI  Antonio [name] is a 76 y.o. male with a history of hypertension, diabetes who comes ED complaining of chills, body aches, fatigue malaise and elevated blood sugars over the past few days.  He is having increased urinary frequency.  He started to have lightheadedness with standing, feeling like his balance is off and that he might fall.  Denies loss of consciousness or injuries.  No trauma.  Denies headache vision change or vomiting.  No diarrhea.  Eating and drinking normally.     Physical Exam   Triage Vital Signs: ED Triage Vitals  Enc Vitals Group     BP 12/14/21 0924 102/82     Pulse Rate 12/14/21 0926 81     Resp 12/14/21 0924 18     Temp 12/14/21 0924 97.8 F (36.6 C)     Temp Source 12/14/21 0924 Oral     SpO2 12/14/21 0924 96 %     Weight --      Height --      Head Circumference --      Peak Flow --      Pain Score 12/14/21 0923 0     Pain Loc --      Pain Edu? --      Excl. in [place]? --     Most recent vital signs: Vitals:   12/14/21 1102 12/14/21 1243  BP: (!) 119/57 137/60  Pulse: 76 86  Resp: 17 16  Temp: 98.1 F (36.7 C)   SpO2: 94% 94%    General: Awake, no distress.  CV:  Good peripheral perfusion.  Regular rate and rhythm.  Symmetric distal pulses. Resp:  Normal effort.  Clear to auscultation bilaterally.  No crackles or wheezing. Abd:  No distention.  Soft, nontender except mild suprapubic discomfort.  No CVA tenderness. Other:  No lower extremity edema or calf tenderness.  Full range of motion all extremities.  Normal gait.   ED Results / Procedures / Treatments   Labs (all labs ordered are listed, but only abnormal results are displayed) Labs Reviewed  LACTIC ACID, PLASMA - Abnormal; Notable for the following components:      Result Value   Lactic  Acid, Venous 3.3 (*)    All other components within normal limits  LACTIC ACID, PLASMA - Abnormal; Notable for the following components:   Lactic Acid, Venous 3.4 (*)    All other components within normal limits  COMPREHENSIVE METABOLIC PANEL - Abnormal; Notable for the following components:   CO2 21 (*)    Glucose, Bld 326 (*)    BUN 34 (*)    Creatinine, Ser 1.61 (*)    GFR, Estimated 44 (*)    All other components within normal limits  CBC WITH DIFFERENTIAL/PLATELET - Abnormal; Notable for the following components:   Platelets 148 (*)    Lymphs Abs 0.4 (*)    All other components within normal limits  URINALYSIS, ROUTINE W REFLEX MICROSCOPIC - Abnormal; Notable for the following components:   Color, Urine AMBER (*)    APPearance CLOUDY (*)    Hgb urine dipstick SMALL (*)    Protein, ur 100 (*)    Leukocytes,Ua LARGE (*)    WBC, UA >50 (*)    Bacteria, UA MANY (*)  All other components within normal limits  SARS CORONAVIRUS 2 BY RT PCR  URINE CULTURE     EKG    RADIOLOGY Chest x-ray interpreted by me, unremarkable.  No effusion edema pneumothorax or consolidation.  Radiology report reviewed noting small left basilar opacity.  Not clinically significant, with no matching exam findings, no respiratory symptoms.   PROCEDURES:  Procedures   MEDICATIONS ORDERED IN ED: Medications  lactated ringers bolus 1,000 mL (0 mLs Intravenous Stopped 12/14/21 1213)  cephALEXin (KEFLEX) capsule 500 mg (500 mg Oral Given 12/14/21 1251)     IMPRESSION / MDM / ASSESSMENT AND PLAN / ED COURSE  I reviewed the triage vital signs and the nursing notes.                              Differential diagnosis includes, but is not limited to, dehydration, viral illness, AKI, electrolyte abnormality, UTI, COVID  Patient's presentation is most consistent with acute presentation with potential threat to life or bodily function.  Patient presents with hyperglycemia, urinary symptoms, and  malaise for the past 2 days.  He is nontoxic with unremarkable vital signs.  He is ambulatory.  Labs show an initial lactic acid level of 3.3, urinalysis consistent with UTI.  Chemistry panel shows slight AKI with a creatinine of 1.6.  CBC is unremarkable.  COVID-negative  These findings were discussed with the patient who states that he feels okay, does not need to be hospitalized.  Patient was given IV fluids for hydration.  Will check bladder scan to ensure he is not experiencing urinary retention, will check orthostatic vitals.  Plan to start Keflex for UTI coverage and follow-up with PCP.    ----------------------------------------- 1:28 PM on 12/14/2021 ----------------------------------------- Vitals remain normal.  Bladder scan is normal.  Orthostatics are normal.  Patient is not septic.  Stable for outpatient follow-up with return precautions.     FINAL CLINICAL IMPRESSION(S) / ED DIAGNOSES   Final diagnoses:  Acute cystitis without hematuria  Type 2 diabetes mellitus with hyperglycemia, with long-term current use of insulin (HCC)  Dehydration     Rx / DC Orders   ED Discharge Orders          Ordered    cephALEXin (KEFLEX) 500 MG capsule  2 times daily        12/14/21 1224             Note:  This document was prepared using Dragon voice recognition software and may include unintentional dictation errors.   Carrie Mew, MD 12/14/21 [place], MD 12/14/21 1328

## 2021-12-14 NOTE — Telephone Encounter (Signed)
Agree with precautions given to pt  Agree with nurse assessment in plan.  Thank you for speaking with them. 

## 2021-12-14 NOTE — ED Notes (Signed)
Bladder scan showed 58 mL.

## 2021-12-14 NOTE — Telephone Encounter (Addendum)
Pt calling was triaged by access nurse this morning, for 3 days pt has had fever, chills, body aches,weakness, pt cannot maintain his balance, and states BS very high.  Pt said FBS this morning was 267 and pt did not hardly eat anything yesterday. Pt took Tylenol earlier this morning and fever has gone down. Pt does not have cough, congestion or S/T. No available appts at [facility].Pt is going to [facility] ED now; pts wife will drive him. Sending note to Red Christians FNP as [name].    [facility] - Client TELEPHONE ADVICE RECORD AccessNurse Patient Name: Antonio [name] RT Gender: Male DOB: [birth year] Age: 76 Y 11 M 21 D Return Phone Number: [phone] (Primary), [phone] (Secondary) Address: [name]  [address] Client [place] [facility] - Client Client Site [place] - Night Provider Alma Friendly - NP Contact Type Call Who Is Calling Patient / Member / Family / Caregiver Call Type Triage / Clinical Caller Name Butch Penny Step Relationship To Patient Care Giver Return Phone Number [phone] (Secondary) Chief Complaint Muscle Jerks, Tics And Shudders Reason for Call Request to Schedule Office Appointment Initial Comment Caller states he has chills, tremors and wants an appt. Translation No Nurse Assessment Nurse: Claiborne Billings, RN, Kim Date/Time (Eastern Time): 12/14/2021 7:49:04 AM Confirm and document reason for call. If symptomatic, describe symptoms. ---Caller states pt had a fever of 102 yesterday and felt weak, current temp is 98.6 and weakness has resolved but he is c/o chills and freezing, shaking. He is also c/o body aches. Does the patient have any new or worsening symptoms? ---Yes Will a triage be completed? ---Yes Related visit to physician within the last 2 weeks? ---No Does the PT have any chronic conditions? (i.e. diabetes, asthma, this includes High risk factors for pregnancy,  etc.) ---Yes List chronic conditions. ---Diabetic Is this a behavioral health or substance abuse call? ---No Guidelines Guideline Title Affirmed Question Affirmed Notes Nurse Date/Time (Eastern Time) Muscle Aches and Body Pain Diabetes mellitus or weak immune system (e.g., HIV positive, cancer chemo, splenectomy, organ transplant, chronic steroids) Claiborne Billings, RN, Kim 12/14/2021 7:51:41 AM PLEASE NOTE: All timestamps contained within this report are represented as [place] Standard Time. CONFIDENTIALTY NOTICE: This fax transmission is intended only for the addressee. It contains information that is legally privileged, confidential or otherwise protected from use or disclosure. If you are not the intended recipient, you are strictly prohibited from reviewing, disclosing, copying using or disseminating any of this information or taking any action in reliance on or regarding this information. If you have received this fax in error, please notify us immediately by telephone so that we can arrange for its return to [place]. Phone: [phone], Toll-Free: [phone], Fax: [phone] Page: 2 of 2 Call Id: [id number] [place]. Time Eilene Ghazi Time) Disposition Final User 12/14/2021 7:53:19 AM Call PCP within 24 Hours Yes Claiborne Billings RN, Kim Final Disposition 12/14/2021 7:53:19 AM Call PCP within 24 Hours Yes Claiborne Billings, RN, Max Sane Disagree/Comply Comply Caller Understands Yes PreDisposition Did not know what to do Care Advice Given Per Guideline CALL PCP WITHIN 24 HOURS: * You need to discuss this with your doctor (or NP/PA) within the next 24 hours. * IF OFFICE WILL BE OPEN: Call the office when it opens tomorrow morning. CALL BACK IF: * Fever occurs * You become worse CARE ADVICE given per Muscle Aches and Body Pain (Adult) guideline. Referrals REFERRED TO PCP OFFIC

## 2021-12-16 LAB — URINE CULTURE: Culture: >=100000 — AB

## 2021-12-17 NOTE — Consult Note (Signed)
ED Antimicrobial Stewardship Positive Culture Follow Up   Antonio [name] is an 76 y.o. male who presented to [facility] on 12/14/2021 with a chief complaint of chills, body aches, fatigue, and malaise for a few days with increased urinary frequency. Patient was given IV fluids for hydration, started on Keflex for UTI coverage, and discharged with instruction to follow-up with PCP.   11/8 cultures now growing E. coli (R-ampicillin, ampicillin/sulbactam, cefazolin). Patient previously grew E. coli with similar resistance patterns on 07/06/21, 04/27/21, and 03/29/21. Patient was called twice in attempt to follow up with new culture results and provide new antibiotic prescription. Will attempt to reach patient again tomorrow (11/12).  Chief Complaint  Patient presents with   Fever    Recent Results (from the past 720 hour(s))  Urine Culture     Status: Abnormal   Collection Time: 12/14/21  9:28 AM   Specimen: Urine, Clean Catch  Result Value Ref Range Status   Specimen Description   Final    URINE, CLEAN CATCH Performed at [facility], [address] [place], [place] [address]    Special Requests   Final    NONE Performed at [facility], [place], [place] [address]    Culture >=100,000 COLONIES/mL ESCHERICHIA COLI (A)  Final   Report Status 12/16/2021 FINAL  Final   Organism ID, Bacteria ESCHERICHIA COLI (A)  Final      Susceptibility   Escherichia coli - MIC*    AMPICILLIN >=32 RESISTANT Resistant     CEFAZOLIN >=64 RESISTANT Resistant     CEFEPIME <=0.12 SENSITIVE Sensitive     CEFTRIAXONE <=0.25 SENSITIVE Sensitive     CIPROFLOXACIN <=0.25 SENSITIVE Sensitive     GENTAMICIN <=1 SENSITIVE Sensitive     IMIPENEM <=0.25 SENSITIVE Sensitive     NITROFURANTOIN <=16 SENSITIVE Sensitive     TRIMETH/SULFA <=20 SENSITIVE Sensitive     AMPICILLIN/SULBACTAM >=32 RESISTANT Resistant     PIP/TAZO <=4 SENSITIVE Sensitive     * >=100,000 COLONIES/mL  ESCHERICHIA COLI  SARS Coronavirus 2 by RT PCR (hospital order, performed in [place] hospital lab) *cepheid single result test*     Status: None   Collection Time: 12/14/21 10:10 AM   Specimen: Nasal Swab  Result Value Ref Range Status   SARS Coronavirus 2 by RT PCR NEGATIVE NEGATIVE Final    Comment: (NOTE) SARS-CoV-2 target nucleic acids are NOT DETECTED.  The SARS-CoV-2 RNA is generally detectable in upper and lower respiratory specimens during the acute phase of infection. The lowest concentration of SARS-CoV-2 viral copies this assay can detect is 250 copies / mL. A negative result does not preclude SARS-CoV-2 infection and should not be used as the sole basis for treatment or other patient management decisions.  A negative result may occur with improper specimen collection / handling, submission of specimen other than nasopharyngeal swab, presence of viral mutation(s) within the areas targeted by this assay, and inadequate number of viral copies (<250 copies / mL). A negative result must be combined with clinical observations, patient history, and epidemiological information.  Fact Sheet for Patients:   https://www.patel.info/  Fact Sheet for Healthcare Providers: https://hall.com/  This test is not yet approved or  cleared by the [place] FDA and has been authorized for detection and/or diagnosis of SARS-CoV-2 by FDA under an Emergency Use Authorization (EUA).  This EUA will remain in effect (meaning this test can be used) for the duration of the COVID-19 declaration under Section  564(b)(1) of the Act, 21 U.S.C. section 360bbb-3(b)(1), unless the authorization is terminated or revoked sooner.  Performed at [facility], [address] [place], [place] [address]   Culture, blood (routine x 2)     Status: None (Preliminary result)   Collection Time: 12/14/21 10:59 AM   Specimen: BLOOD  Result Value Ref Range  Status   Specimen Description BLOOD BLOOD RIGHT ARM  Final   Special Requests   Final    BOTTLES DRAWN AEROBIC AND ANAEROBIC Blood Culture results may not be optimal due to an excessive volume of blood received in culture bottles   Culture   Final    NO GROWTH 3 DAYS Performed at [facility], [address] [place], [place] [address]    Report Status PENDING  Incomplete    [x]  Treated with Keflex, organism resistant to prescribed antimicrobial []  Patient discharged originally without antimicrobial agent and treatment is now indicated  New antibiotic prescription: Nitrofurantoin 100 mg BID x7 days (no prescription sent, unable to reach patient)  ED Provider: Carrie Mew, MD  Gretel Acre, PharmD PGY1 Pharmacy Resident 12/17/2021 1:00 PM

## 2021-12-19 ENCOUNTER — Encounter: Payer: Self-pay | Admitting: *Deleted

## 2021-12-19 ENCOUNTER — Telehealth: Payer: Self-pay | Admitting: *Deleted

## 2021-12-19 LAB — CULTURE, BLOOD (ROUTINE X 2): Culture: NO GROWTH

## 2021-12-19 NOTE — Patient Outreach (Signed)
  Care Coordination TOC Note Transition Care Management Unsuccessful Follow-up Telephone Call  Date of discharge and from where:  Wednesday 12/14/21 ED [facility]; acute cystitis, dehydration, fever; EMMI RED Alert: "No scheduled follow up"  Attempts:  1st Attempt  Reason for unsuccessful TCM follow-up call:  Left voice message  Oneta Rack, RN, BSN, CCRN Alumnus RN CM Care Coordination/ Transition of [place] Management [phone]: direct office

## 2021-12-20 ENCOUNTER — Encounter: Payer: Self-pay | Admitting: *Deleted

## 2021-12-20 ENCOUNTER — Telehealth: Payer: Self-pay | Admitting: *Deleted

## 2021-12-20 DIAGNOSIS — E1159 Type 2 diabetes mellitus with other circulatory complications: Secondary | ICD-10-CM | POA: Diagnosis not present

## 2021-12-20 NOTE — Patient Outreach (Signed)
  Care Coordination TOC Note Transition Care Management Follow-up Telephone Call Date of discharge and from where: Wednesday 12/14/21 ED visit- [facility]; acute cystitis, dehydration, fever; EMMI Red Alert, "No scheduled follow up" How have you been since you were released from the hospital? "I am doing great; feeling much better; taking the antibiotics they gave me, fever is gone and I am able to do everything I need to to care for myself, but my wife helps me if I need anything.  My sugar is looking better now that I am getting better.  I see my PCP tomorrow and I don't have any questions or concerns today" Any questions or concerns? No- verified PCP appointment tomorrow  Items Reviewed: Did the pt receive and understand the discharge instructions provided? Yes  Medications obtained and verified? Yes  verified patient obtained and is taking antibiotics as prescribed post- recent ED visit Other? No  Any new allergies since your discharge? No  Dietary orders reviewed? Yes Do you have support at home? Yes  wife assisting with ADL's and iADL's as needed/ indicated  Home Care and Equipment/Supplies: Were home health services ordered? no If so, what is the name of the agency? N/A  Has the agency set up a time to come to the patient's home? not applicable Were any new equipment or medical supplies ordered?  No What is the name of the medical supply agency? N/A Were you able to get the supplies/equipment? not applicable Do you have any questions related to the use of the equipment or supplies? No N/A  Functional Questionnaire: (I = Independent and D = Dependent) ADLs: I  wife assisting with ADL's and iADL's as needed  Bathing/Dressing- I  Meal Prep- I  wife assisting with ADL's and iADL's as needed  Eating- I  Maintaining continence- I  Transferring/Ambulation- I  Managing Meds- I  Follow up appointments reviewed:  PCP Hospital f/u appt confirmed? Yes  Scheduled to see PCP Allie Bossier on  Wednesday 12/21/21 @ 3:00 pm Specialist Hospital f/u appt confirmed? No  Scheduled to see - on - @ - Are transportation arrangements needed? No  If their condition worsens, is the pt aware to call PCP or go to the Emergency Dept.? Yes Was the patient provided with contact information for the PCP's office or ED? Yes Was to pt encouraged to call back with questions or concerns? Yes  SDOH assessments and interventions completed:   Yes  Care Coordination Interventions Activated:  No   Care Coordination Interventions:  No Care Coordination interventions needed at this time.   Encounter Outcome:  Pt. Visit Completed    Oneta Rack, RN, BSN, CCRN Alumnus RN CM Care Coordination/ Transition of [place] Management [phone]: direct office

## 2021-12-21 ENCOUNTER — Ambulatory Visit (INDEPENDENT_AMBULATORY_CARE_PROVIDER_SITE_OTHER): Payer: Medicare Other | Admitting: Primary Care

## 2021-12-21 ENCOUNTER — Encounter: Payer: Self-pay | Admitting: Primary Care

## 2021-12-21 VITALS — BP 160/78 | HR 41 | Temp 97.0°F | Ht 66.0 in | Wt 178.0 lb

## 2021-12-21 DIAGNOSIS — I10 Essential (primary) hypertension: Secondary | ICD-10-CM | POA: Diagnosis not present

## 2021-12-21 DIAGNOSIS — R5383 Other fatigue: Secondary | ICD-10-CM

## 2021-12-21 DIAGNOSIS — Z794 Long term (current) use of insulin: Secondary | ICD-10-CM | POA: Diagnosis not present

## 2021-12-21 DIAGNOSIS — E1165 Type 2 diabetes mellitus with hyperglycemia: Secondary | ICD-10-CM | POA: Diagnosis not present

## 2021-12-21 DIAGNOSIS — I2510 Atherosclerotic heart disease of native coronary artery without angina pectoris: Secondary | ICD-10-CM | POA: Diagnosis not present

## 2021-12-21 DIAGNOSIS — I441 Atrioventricular block, second degree: Secondary | ICD-10-CM

## 2021-12-21 NOTE — Assessment & Plan Note (Signed)
Could be multifactorial including hyperglycemia, bradycardia, hypertension, chronic cardiac history. Reviewed labs and notes from ED visit from last week. Reviewed labs from endocrinology through care everywhere from yesterday.  We will get in touch with patient's cardiologist regarding his vital signs and symptoms today.

## 2021-12-21 NOTE — Assessment & Plan Note (Addendum)
Above goal today, improved on recheck, also with multiple elevated BP readings in chart.   Avoid beta blocker given bradycardia.  Avoid nondihydropyridine CCB give history of heart block.   Will consult with patient's cardiologist for whom he will see in 2 weeks.  ECG today with bradycardia with ventricular rate of 41. Also with type 1 A-V block. Appears similar to ECG from May 2023.

## 2021-12-21 NOTE — Patient Instructions (Signed)
Follow up with endocrinology and your cardiologist as discussed.  It was a pleasure to see you today!

## 2021-12-21 NOTE — Assessment & Plan Note (Addendum)
Recent A1c of 9.9. Reviewed through care everywhere from endocrinology.  He will follow-up with endocrinology next week. Do suspect that his hyperglycemia could be contributing to his fatigue.  Continue glipizide 10 mg twice daily, metformin 1000 mg twice daily, Levemir 15 units daily.

## 2021-12-21 NOTE — Progress Notes (Signed)
Subjective:    Patient ID: Antonio [name], male    DOB: October 15, 1945, 76 y.o.   MRN: [id number]  HPI  Antonio [name] is a very pleasant 76 y.o. male with a history of hypertension, PAD, complete heart block, type 2 diabetes, BPH, tobacco abuse, ischemic leg, gastric AVM, iron deficiency anemia who presents today for ED follow-up.  He presented to [facility] ED on 12/14/2021 with a several day history of chills, body aches, fatigue, hyperglycemia, lightheadedness.  During his stay in the ED he underwent labs which showed elevated lactic acid, mild AKI.  His urinalysis was consistent with cystitis.  He was treated with IV fluids.  He was discharged home later that afternoon with a prescription for Keflex 500 mg twice daily.  Since his ED visit he is compliant to his Keflex course. He is feeling better. His symptoms nearly resolved upon discharge. He denies dysuria, urinary frequency, difficulty urinating, foul smelling odor.   He underwent labs per his endocrinologist which revealed A1c of 9.9 and improved kidney function compared to his ED visit result. He has follow up scheduled with his cardiologist and endocrinologist within the next two weeks.  He's felt sluggish without energy over the last month, also with imbalance. He will fall back into his chair each time he tries to get up. He denies recent fall with injury. He denies increased shortness of breath, chest pain.    Review of Systems  Constitutional:  Positive for fatigue.  Respiratory:  Negative for shortness of breath.   Cardiovascular:  Negative for chest pain.  Genitourinary:  Negative for dysuria, frequency and urgency.  Neurological:  Positive for dizziness. Negative for headaches.         Past Medical History:  Diagnosis Date   Anemia    Arthritis    Asthma    age 26   BPH with urinary obstruction    stable on flomax ([name]   Coronary atherosclerosis of unspecified type of vessel, native or graft    Diabetes mellitus     Type II   Emphysema of lung (HCC)    Esophageal stricture    Gastritis    GERD (gastroesophageal reflux disease)    Hiatal hernia    Hx of colonic polyps    Hyperlipidemia    Hypertension    Internal hemorrhoids without mention of complication    Lumbago    Pain in both lower legs    Pneumonia    PSA elevation    now averaging 2's   Tobacco abuse    URI (upper respiratory infection)    Urinary retention     Social History   Socioeconomic History   Marital status: Widowed    Spouse name: [name]   Number of children: 3   Years of education: Not on file   Highest education level: Not on file  Occupational History   Occupation: Retired Games developer  Tobacco Use   Smoking status: Former    Packs/day: 2.00    Years: 57.00    Total pack years: 114.00    Types: Cigarettes    Quit date: 05/11/2021    Years since quitting: 0.6   Smokeless tobacco: Former    Types: Chew   Tobacco comments:    0.5 PPD since 04/20/2021  Vaping Use   Vaping Use: Never used  Substance and Sexual Activity   Alcohol use: Not Currently    Alcohol/week: 0.0 standard drinks of alcohol    Comment: rare   Drug  use: No   Sexual activity: Yes  Other Topics Concern   Not on file  Social History Narrative   Married 9/09 wife with moderate memory problems.   3 adult children, 2 grandchildren   Would desire CPR   Social Determinants of Health   Financial Resource Strain: Low Risk  (03/28/2021)   Overall Financial Resource Strain (CARDIA)    Difficulty of Paying Living Expenses: Not very hard  Recent Concern: Financial Resource Strain - High Risk (01/20/2021)   Overall Financial Resource Strain (CARDIA)    Difficulty of Paying Living Expenses: Hard  Food Insecurity: No Food Insecurity (01/20/2021)   Hunger Vital Sign    Worried About Running Out of Food in the Last Year: Never true    Ran Out of Food in the Last Year: Never true  Transportation Needs: No Transportation Needs (01/20/2021)    PRAPARE - Hydrologist (Medical): No    Lack of Transportation (Non-Medical): No  Physical Activity: Inactive (01/20/2021)   Exercise Vital Sign    Days of Exercise per Week: 0 days    Minutes of Exercise per Session: 0 min  Stress: No Stress Concern Present (01/20/2021)   [place]    Feeling of Stress : Not at all  Social Connections: Unknown (01/10/2018)   Social Connection and Isolation Panel [NHANES]    Frequency of Communication with Friends and Family: More than three times a week    Frequency of Social Gatherings with Friends and Family: Three times a week    Attends Religious Services: Never    Active Member of Clubs or Organizations: Not on file    Attends Archivist Meetings: Not on file    Marital Status: Married  Intimate Partner Violence: Not At Risk (01/20/2021)   Humiliation, Afraid, Rape, and Kick questionnaire    Fear of Current or Ex-Partner: No    Emotionally Abused: No    Physically Abused: No    Sexually Abused: No    Past Surgical History:  Procedure Laterality Date   2D Echo  06/03   Abdominal ultrasound  06/03   Negative   BACK SURGERY     COLONOSCOPY     CT of head  and sinuses  06/03   Negative   CT of the chest, abdomen, pelvis  06/03   Chest negative; Abdomen, left adrenal lesion; Pelvis, enlarged prostate   ESOPHAGOGASTRODUODENOSCOPY  02/13/07   gastritis and duodenitis without bleed   ESOPHAGOGASTRODUODENOSCOPY N/A 06/26/2014   Procedure: ESOPHAGOGASTRODUODENOSCOPY (EGD);  Surgeon: Milus Banister, MD;  Location: [facility];  Service: Endoscopy;  Laterality: N/A;   ESOPHAGOGASTRODUODENOSCOPY (EGD) WITH PROPOFOL N/A 04/08/2020   Procedure: ESOPHAGOGASTRODUODENOSCOPY (EGD) WITH PROPOFOL;  Surgeon: Ladene Artist, MD;  Location: [facility];  Service: Endoscopy;  Laterality: N/A;   HOT HEMOSTASIS N/A 04/08/2020   Procedure: HOT HEMOSTASIS (ARGON  PLASMA COAGULATION/BICAP);  Surgeon: Ladene Artist, MD;  Location: [facility];  Service: Endoscopy;  Laterality: N/A;   KNEE ARTHROSCOPY  10/00   Right   LEFT HEART CATH AND CORONARY ANGIOGRAPHY N/A 05/23/2021   Procedure: LEFT HEART CATH AND CORONARY ANGIOGRAPHY;  Surgeon: [name], MD;  Location: [facility];  Service: Cardiovascular;  Laterality: N/A;   LOWER EXTREMITY ANGIOGRAPHY Right 12/27/2017   Procedure: LOWER EXTREMITY ANGIOGRAPHY;  Surgeon: Algernon Huxley, MD;  Location: [facility];  Service: Cardiovascular;  Laterality: Right;   LOWER  EXTREMITY ANGIOGRAPHY Left 01/10/2018   Procedure: LOWER EXTREMITY ANGIOGRAPHY;  Surgeon: Algernon Huxley, MD;  Location: [facility];  Service: Cardiovascular;  Laterality: Left;   LOWER EXTREMITY ANGIOGRAPHY Left 05/29/2018   Procedure: LOWER EXTREMITY ANGIOGRAPHY;  Surgeon: Algernon Huxley, MD;  Location: [facility];  Service: Cardiovascular;  Laterality: Left;   LOWER EXTREMITY ANGIOGRAPHY Left 05/30/2018   Procedure: Lower Extremity Angiography;  Surgeon: Algernon Huxley, MD;  Location: [facility];  Service: Cardiovascular;  Laterality: Left;   LP  06/03   Microwave thermotherapy prostate  08/28/07   [place]   [place] cardiolite  03/01   EF 68%   POLYPECTOMY  11/95   Stress myoview  07/06   EF 67%   UPPER GASTROINTESTINAL ENDOSCOPY      Family History  Problem Relation Age of Onset   Diabetes Father    Alcohol abuse Paternal Uncle    Alcohol abuse Paternal Uncle    Alcohol abuse Paternal Uncle    Rheum arthritis Mother    Heart disease Neg Hx    Stroke Neg Hx    Cancer Neg Hx    Drug abuse Neg Hx    Depression Neg Hx    Colon cancer Neg Hx    Rectal cancer Neg Hx    Stomach cancer Neg Hx    Kidney cancer Neg Hx    Bladder Cancer Neg Hx    Prostate cancer Neg Hx     No Known Allergies  Current Outpatient Medications on File Prior to Visit  Medication Sig Dispense Refill    acetaminophen (TYLENOL) 500 MG tablet Take 1,000 mg by mouth every 6 (six) hours as needed (hip pain.). Rapid release     aspirin 81 MG EC tablet Take 81 mg by mouth in the morning.     atorvastatin (LIPITOR) 20 MG tablet TAKE 1 TABLET BY MOUTH IN THE EVENING FOR CHOLESTEROL 90 tablet 1   cephALEXin (KEFLEX) 500 MG capsule Take 1 capsule (500 mg total) by mouth 2 (two) times daily. 14 capsule 0   clopidogrel (PLAVIX) 75 MG tablet TAKE 1 TABLET BY MOUTH EVERY DAY 90 tablet 2   ferrous gluconate (FERGON) 324 MG tablet TAKE 1 TABLET BY MOUTH EVERY DAY WITH BREAKFAST 30 tablet 0   FIBER PO Take 1 capsule by mouth in the morning.     finasteride (PROSCAR) 5 MG tablet TAKE 1 TABLET BY MOUTH EVERY DAY (Patient taking differently: Take 5 mg by mouth daily.) 90 tablet 3   fluticasone (FLONASE) 50 MCG/ACT nasal spray SPRAY 2 SPRAYS INTO EACH NOSTRIL EVERY DAY 16 mL 5   Fluticasone-Umeclidin-Vilant (TRELEGY ELLIPTA) 100-62.5-25 MCG/ACT AEPB Inhale 1 puff into the lungs daily. 28 each 11   Fluticasone-Umeclidin-Vilant (TRELEGY ELLIPTA) 100-62.5-25 MCG/ACT AEPB Inhale 1 puff into the lungs daily. 14 each 0   glipiZIDE (GLUCOTROL) 10 MG tablet Take 10 mg by mouth daily before supper.      GLUCOSAMINE HCL PO Take 1 tablet by mouth daily.     insulin detemir (LEVEMIR) 100 UNIT/ML injection Inject 20 units at bedtime. (Patient taking differently: Inject 15 Units into the skin daily.) 10 pen 5   lisinopril-hydrochlorothiazide (ZESTORETIC) 20-25 MG tablet Take 1 tablet by mouth daily. For blood pressure. 90 tablet 3   metFORMIN (GLUCOPHAGE) 500 MG tablet TAKE 2 TABLETS BY MOUTH TWICE A DAY WITH MEAL FOR DIABETES (Patient taking differently: Take 1,000 mg by mouth 2 (two) times daily with a meal.)  360 tablet 2   pantoprazole (PROTONIX) 40 MG tablet Take 1 tablet (40 mg total) by mouth daily. For heartburn 90 tablet 3   sildenafil (VIAGRA) 100 MG tablet Take 1 tablet (100 mg total) by mouth daily as needed for erectile  dysfunction. 30 tablet 0   tamsulosin (FLOMAX) 0.4 MG CAPS capsule TAKE 1 CAPSULE BY MOUTH 2 TIMES DAILY. (Patient taking differently: Take 0.4 mg by mouth 2 (two) times daily.) 180 capsule 3   vitamin B-12 (CYANOCOBALAMIN) 1000 MCG tablet Take 1,000 mcg by mouth in the morning.     No current facility-administered medications on file prior to visit.    BP (!) 160/78   Pulse (!) 41   Temp (!) 97 F (36.1 C) (Temporal)   Ht 5\' 6"  (1.676 m)   Wt 178 lb (80.7 kg)   SpO2 98%   BMI 28.73 kg/m  Objective:   Physical Exam Constitutional:      General: He is not in acute distress.    Appearance: He is not ill-appearing.  Cardiovascular:     Rate and Rhythm: Regular rhythm. Bradycardia present.  Pulmonary:     Effort: Pulmonary effort is normal.     Breath sounds: Normal breath sounds. No wheezing or rales.  Musculoskeletal:     Cervical back: Neck supple.  Skin:    General: Skin is warm and dry.  Neurological:     Mental Status: He is alert and oriented to person, place, and time.           Assessment & Plan:   Problem List Items Addressed This Visit       Cardiovascular and Mediastinum   Essential hypertension - Primary    Above goal today, improved on recheck, also with multiple elevated BP readings in chart.   Avoid beta blocker given bradycardia.  Avoid nondihydropyridine CCB give history of heart block.   Will consult with patient's cardiologist for whom he will see in 2 weeks.  ECG today with bradycardia with ventricular rate of 41. Also with type 1 A-V block. Appears similar to ECG from May 2023.      Relevant Orders   EKG 12-Lead (Completed)   Atrioventricular block, Mobitz type 1, Wenckebach    Noted on ECG today.  Follow up with cardiology.          Endocrine   Type 2 diabetes mellitus with hyperglycemia (HCC)    Recent A1c of 9.9. Reviewed through care everywhere from endocrinology.  He will follow-up with endocrinology next week. Do suspect  that his hyperglycemia could be contributing to his fatigue.  Continue glipizide 10 mg twice daily, metformin 1000 mg twice daily, Levemir 15 units daily.         Other   Fatigue    Could be multifactorial including hyperglycemia, bradycardia, hypertension, chronic cardiac history. Reviewed labs and notes from ED visit from last week. Reviewed labs from endocrinology through care everywhere from yesterday.  We will get in touch with patient's cardiologist regarding his vital signs and symptoms today.            Pleas Koch, NP

## 2021-12-21 NOTE — Assessment & Plan Note (Signed)
Noted on ECG today.  Follow up with cardiology.

## 2021-12-22 ENCOUNTER — Telehealth: Payer: Self-pay

## 2021-12-22 NOTE — Progress Notes (Addendum)
Chronic Care Management Pharmacy Assistant   Name: Antonio [name]  MRN: [id number] DOB: August 28, 1945  Reason for Encounter: [facility])  Medications: Outpatient Encounter Medications as of 12/22/2021  Medication Sig   acetaminophen (TYLENOL) 500 MG tablet Take 1,000 mg by mouth every 6 (six) hours as needed (hip pain.). Rapid release   aspirin 81 MG EC tablet Take 81 mg by mouth in the morning.   atorvastatin (LIPITOR) 20 MG tablet TAKE 1 TABLET BY MOUTH IN THE EVENING FOR CHOLESTEROL   cephALEXin (KEFLEX) 500 MG capsule Take 1 capsule (500 mg total) by mouth 2 (two) times daily.   clopidogrel (PLAVIX) 75 MG tablet TAKE 1 TABLET BY MOUTH EVERY DAY   ferrous gluconate (FERGON) 324 MG tablet TAKE 1 TABLET BY MOUTH EVERY DAY WITH BREAKFAST   FIBER PO Take 1 capsule by mouth in the morning.   finasteride (PROSCAR) 5 MG tablet TAKE 1 TABLET BY MOUTH EVERY DAY (Patient taking differently: Take 5 mg by mouth daily.)   fluticasone (FLONASE) 50 MCG/ACT nasal spray SPRAY 2 SPRAYS INTO EACH NOSTRIL EVERY DAY   Fluticasone-Umeclidin-Vilant (TRELEGY ELLIPTA) 100-62.5-25 MCG/ACT AEPB Inhale 1 puff into the lungs daily.   Fluticasone-Umeclidin-Vilant (TRELEGY ELLIPTA) 100-62.5-25 MCG/ACT AEPB Inhale 1 puff into the lungs daily.   glipiZIDE (GLUCOTROL) 10 MG tablet Take 10 mg by mouth daily before supper.    GLUCOSAMINE HCL PO Take 1 tablet by mouth daily.   insulin detemir (LEVEMIR) 100 UNIT/ML injection Inject 20 units at bedtime. (Patient taking differently: Inject 15 Units into the skin daily.)   lisinopril-hydrochlorothiazide (ZESTORETIC) 20-25 MG tablet Take 1 tablet by mouth daily. For blood pressure.   metFORMIN (GLUCOPHAGE) 500 MG tablet TAKE 2 TABLETS BY MOUTH TWICE A DAY WITH MEAL FOR DIABETES (Patient taking differently: Take 1,000 mg by mouth 2 (two) times daily with a meal.)   pantoprazole (PROTONIX) 40 MG tablet Take 1 tablet (40 mg total) by mouth daily. For heartburn    sildenafil (VIAGRA) 100 MG tablet Take 1 tablet (100 mg total) by mouth daily as needed for erectile dysfunction.   tamsulosin (FLOMAX) 0.4 MG CAPS capsule TAKE 1 CAPSULE BY MOUTH 2 TIMES DAILY. (Patient taking differently: Take 0.4 mg by mouth 2 (two) times daily.)   vitamin B-12 (CYANOCOBALAMIN) 1000 MCG tablet Take 1,000 mcg by mouth in the morning.   No facility-administered encounter medications on file as of 12/22/2021.   Reviewed hospital notes for details of recent visit. Has patient been contacted by Transitions of Care team? Yes Has patient seen PCP/specialist for hospital follow up (summarize OV if yes): Yes Essential hypertension - Primary   Above goal today, improved on recheck, also with multiple elevated BP readings in chart   Avoid beta blocker given bradycardia.   Avoid nondihydropyridine CCB give history of heart block.  Will consult with patient's cardiologist for whom he will see in 2 weeks  ECG today with bradycardia with ventricular rate of 41. Also with type 1 A-V block. Appears similar to ECG from May 2023.  Atrioventricular block, Mobitz type 1, Wenckebach   Noted on ECG today.  Type 2 diabetes mellitus with hyperglycemia (HCC)   Recent A1c of 9.9   He will follow-up with endocrinology next week.   Continue glipizide 10 mg twice daily, metformin 1000 mg twice daily, Levemir 15 units daily  Fatigue  Could be multifactorial including hyperglycemia, bradycardia, hypertension, chronic cardiac history.   We will get in touch with patient's cardiologist  regarding his vital signs and symptoms today.   Admitted to the ED on 12/14/2021. Discharge date was 12/14/2021. Discharged from [facility].   Discharge diagnosis (Principal Problem): Acute cystitis without hematuria       Type 2 diabetes mellitus with hyperglycemia   Patient was discharged to Home  Brief summary of hospital course: Patient's presentation is most consistent with acute presentation  with potential threat to life or bodily function.   Patient presents with hyperglycemia, urinary symptoms, and malaise for the past 2 days.  He is nontoxic with unremarkable vital signs.  He is ambulatory.  Labs show an initial lactic acid level of 3.3, urinalysis consistent with UTI.  Chemistry panel shows slight AKI with a creatinine of 1.6.  CBC is unremarkable.  COVID-negative   These findings were discussed with the patient who states that he feels okay, does not need to be hospitalized.  Patient was given IV fluids for hydration.  Will check bladder scan to ensure he is not experiencing urinary retention, will check orthostatic vitals.  Plan to start Keflex for UTI coverage and follow-up with PCP.  New?Medications Started at [facility] Discharge:?? -Started cephALEXin (KEFLEX) 500 MG capsule    Medication Changes at Hospital Discharge: -No changes noted  Medications Discontinued at Hospital Discharge: -No changes noted  Medications that remain the same after Hospital Discharge:??  -All other medications will remain the same.    Next CCM appt: 03/29/2022  Other upcoming appts: Cardiology appointment on 01/05/2022 AWV appointment on 01/24/2022  Charlene Brooke, PharmD notified and will determine if action is needed.    Pharmacist addendum: Patient has spoken with [facility] team and has seen PCP. No further action needed.  Charlene Brooke, PharmD, BCACP 01/03/22 2:00 PM

## 2022-01-05 ENCOUNTER — Encounter: Payer: Self-pay | Admitting: Internal Medicine

## 2022-01-05 ENCOUNTER — Ambulatory Visit: Payer: Medicare Other | Attending: Internal Medicine | Admitting: Internal Medicine

## 2022-01-05 VITALS — BP 172/68 | HR 50 | Ht 66.0 in | Wt 178.2 lb

## 2022-01-05 DIAGNOSIS — I441 Atrioventricular block, second degree: Secondary | ICD-10-CM | POA: Diagnosis not present

## 2022-01-05 DIAGNOSIS — Z794 Long term (current) use of insulin: Secondary | ICD-10-CM | POA: Diagnosis not present

## 2022-01-05 DIAGNOSIS — R809 Proteinuria, unspecified: Secondary | ICD-10-CM | POA: Diagnosis not present

## 2022-01-05 DIAGNOSIS — I10 Essential (primary) hypertension: Secondary | ICD-10-CM | POA: Insufficient documentation

## 2022-01-05 DIAGNOSIS — E1142 Type 2 diabetes mellitus with diabetic polyneuropathy: Secondary | ICD-10-CM | POA: Diagnosis not present

## 2022-01-05 DIAGNOSIS — R001 Bradycardia, unspecified: Secondary | ICD-10-CM | POA: Insufficient documentation

## 2022-01-05 DIAGNOSIS — E1159 Type 2 diabetes mellitus with other circulatory complications: Secondary | ICD-10-CM | POA: Diagnosis not present

## 2022-01-05 DIAGNOSIS — I2510 Atherosclerotic heart disease of native coronary artery without angina pectoris: Secondary | ICD-10-CM

## 2022-01-05 DIAGNOSIS — E1165 Type 2 diabetes mellitus with hyperglycemia: Secondary | ICD-10-CM | POA: Diagnosis not present

## 2022-01-05 DIAGNOSIS — E1129 Type 2 diabetes mellitus with other diabetic kidney complication: Secondary | ICD-10-CM | POA: Diagnosis not present

## 2022-01-05 NOTE — Progress Notes (Addendum)
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  Some significant fatigue after that, but fatigue has been a big issue.  He also describes imbalance.  This refers to instability upon standing, prolonged standing in the shower, getting up off the commode.  Needs to steady himself and then gradually abates.  He reminded me he has diabetes for a couple of decades.  Has poor feeling in his feet.  Never been told he had diabetic neuropathy.  Fatigue is also characterized by exercise intolerance.  DATE TEST EF   4/23 CTA  Concern for sign Cx lesion  4/23 Echo   60-654 %   4/23 LHC  Nonbostructive CAD           Date Cr K Hgb  11/23 incl (CE) 1.0<<1.61<<0.9 4.3 14.1             Past Medical History:  Diagnosis Date   Anemia    Arthritis    Asthma    age 76   BPH with urinary obstruction    stable on flomax ([name]   Coronary atherosclerosis of unspecified type of vessel, native or graft    Diabetes mellitus    Type II   Emphysema of lung (HCC)    Esophageal stricture    Gastritis    GERD (gastroesophageal reflux disease)    Hiatal hernia    Hx of colonic polyps    Hyperlipidemia    Hypertension    Internal hemorrhoids without mention of complication    Lumbago    Pain in both lower legs    Pneumonia    PSA elevation    now averaging 2's   Tobacco abuse    URI (upper respiratory infection)    Urinary retention    Current Meds  Medication Sig   acetaminophen (TYLENOL) 500 MG tablet Take 1,000 mg by mouth every 6 (six) hours as needed (hip pain.). Rapid release   aspirin 81 MG EC tablet Take 81 mg by mouth in the morning.   atorvastatin (LIPITOR) 20 MG tablet TAKE 1  TABLET BY MOUTH IN THE EVENING FOR CHOLESTEROL   cephALEXin (KEFLEX) 500 MG capsule Take 1 capsule (500 mg total) by mouth 2 (two) times daily.   clopidogrel (PLAVIX) 75 MG tablet TAKE 1 TABLET BY MOUTH EVERY DAY   ferrous gluconate (FERGON) 324 MG tablet TAKE 1 TABLET BY MOUTH EVERY DAY WITH BREAKFAST   FIBER PO Take 1 capsule by mouth in the morning.   finasteride (PROSCAR) 5 MG tablet TAKE 1 TABLET BY MOUTH EVERY DAY   Fluticasone-Umeclidin-Vilant (TRELEGY ELLIPTA) 100-62.5-25 MCG/ACT AEPB Inhale 1 puff into the lungs daily.   glipiZIDE (GLUCOTROL) 10 MG tablet Take 10 mg by mouth daily before supper.    GLUCOSAMINE HCL PO Take 1 tablet by mouth daily.   insulin detemir (LEVEMIR) 100 UNIT/ML injection Inject 20 units at bedtime.   lisinopril-hydrochlorothiazide (ZESTORETIC) 20-25 MG tablet Take 1 tablet by mouth daily. For blood pressure.   metFORMIN (GLUCOPHAGE) 500 MG tablet TAKE 2 TABLETS BY MOUTH TWICE A DAY WITH MEAL FOR DIABETES   pantoprazole (PROTONIX) 40 MG tablet Take 1 tablet (40 mg total) by mouth daily.  For heartburn   sildenafil (VIAGRA) 100 MG tablet Take 1 tablet (100 mg total) by mouth daily as needed for erectile dysfunction.   tamsulosin (FLOMAX) 0.4 MG CAPS capsule TAKE 1 CAPSULE BY MOUTH 2 TIMES DAILY.   vitamin B-12 (CYANOCOBALAMIN) 1000 MCG tablet Take 1,000 mcg by mouth in the morning.       PHYSICAL EXAM Vitals:   01/05/22 0952  BP: (!) 178/60  Pulse: (!) 50  SpO2: 95%  Weight: 178 lb 4 oz (80.9 kg)  Height: 5\' 6"  (1.676 m)  Well developed and nourished in no acute distress HENT normal Neck supple with JVP-    Clear Regular rate and rhythm, no murmurs or gallops Abd-soft with active BS No Clubbing cyanosis edema Skin-warm and dry A & Oriented  Grossly normal sensory and motor function  ECG sinus @65  with 3: 2 Wenckebach at a rate of about 50 Q waves lead III and F QRSd 68/44         Greg great thank you Echo demonstrated normal LV  function  ASSESSMENT AND PLAN:  Sinus bradycardia with Mobitz 1 second-degree AV block  Lung cancer-non-small cell  Peripheral vascular disease  Hypertension  Lightheadedness  Renal insufficiency-acute-improved)   Fatigue continues to be an issue.  We will undertake an event recorder looking for his heart rate excursion.  Not withstanding the fact that there is no serious arrhythmic concern for asystole with his Mobitz 1 heart block there may be fatigue as a consequence of chronotropic incompetence.  This was looked at in the hospital in April and not found to be an issue as his heart rate got to the 90s. We will use a Zio patch  Lightheadedness with standing we will be orthostatic hypotension in the context of longstanding diabetic neuropathy.  He also has systolic supine hypertension of a significant degree and so we will need to, prior to addressing the hypertension sort out to what degree he has hypotension.  Because of running behind, he will come back later this morning for these measurements.   Orthostatic measurements were notable for a delta heart rate of 20 and a decrease in the blood pressure from 130--105, notably the blood pressures previously in the office were 1 70-1 80.  We recommend that we discontinue the hydrochlorothiazide in his Zestril combination and that we try nonpharmacological strategy using an abdominal binder to see if we can mitigate some of his gait instability and orthostatic lightheadedness     Signed, Virl Axe MD  01/05/2022

## 2022-01-05 NOTE — Addendum Note (Signed)
Addended by: Alvis Lemmings C on: 01/05/2022 02:05 PM   Modules accepted: Orders

## 2022-01-05 NOTE — Patient Instructions (Addendum)
Medication Instructions:  - Your physician has recommended you make the following change in your medication:   1) STOP Lisinopril- HCTZ  Blood pressures today: Lying- 130/60 (67) Sitting- 120/52 (72) Standing- 106/50 (88) Standing (3 minutes)- 130/60 (86)  *If you need a refill on your cardiac medications before your next appointment, please call your pharmacy*   Lab Work: - none ordered  If you have labs (blood work) drawn today and your tests are completely normal, you will receive your results only by: MyChart Message (if you have MyChart) OR A paper copy in the mail If you have any lab test that is abnormal or we need to change your treatment, we will call you to review the results.   Testing/Procedures: - none ordered   Follow-Up: At [facility], you and your health needs are our priority.  As part of our continuing mission to provide you with exceptional heart care, we have created designated Provider Care Teams.  These Care Teams include your primary Cardiologist (physician) and Advanced Practice Providers (APPs -  Physician Assistants and Nurse Practitioners) who all work together to provide you with the care you need, when you need it.  We recommend signing up for the patient portal called "MyChart".  Sign up information is provided on this After Visit Summary.  MyChart is used to connect with patients for Virtual Visits (Telemedicine).  Patients are able to view lab/test results, encounter notes, upcoming appointments, etc.  Non-urgent messages can be sent to your provider as well.   To learn more about what you can do with MyChart, go to [email or web address].    Your next appointment:   3 month(s)  The format for your next appointment:   In Person  Provider:   Virl Axe, MD    Other Instructions  - Please wear an abdominal binder during your waking hours to help support your blood pressure when changing position.   These can be found on-line/  [facility]  Important Information About Sugar

## 2022-01-21 ENCOUNTER — Other Ambulatory Visit: Payer: Self-pay | Admitting: Primary Care

## 2022-01-21 DIAGNOSIS — E782 Mixed hyperlipidemia: Secondary | ICD-10-CM

## 2022-01-24 ENCOUNTER — Ambulatory Visit (INDEPENDENT_AMBULATORY_CARE_PROVIDER_SITE_OTHER): Payer: Medicare Other

## 2022-01-24 VITALS — Ht 66.0 in | Wt 178.0 lb

## 2022-01-24 DIAGNOSIS — Z0000 Encounter for general adult medical examination without abnormal findings: Secondary | ICD-10-CM | POA: Diagnosis not present

## 2022-01-24 NOTE — Patient Instructions (Signed)
[name] , Thank you for taking time to come for your Medicare Wellness Visit. I appreciate your ongoing commitment to your health goals. Please review the following plan we discussed and let me know if I can assist you in the future.   Screening recommendations/referrals: Colonoscopy: 12/11/19 Recommended yearly ophthalmology/optometry visit for glaucoma screening and checkup Recommended yearly dental visit for hygiene and checkup  Vaccinations: Influenza vaccine: n/d Pneumococcal vaccine: 09/30/18 Tdap vaccine: 11/19/06 Shingles vaccine: n/d   Covid-19: 07/09/19, 08/06/19  Advanced directives: no  Conditions/risks identified: none  Next appointment: Follow up in one year for your annual wellness visit. 01/26/23 @ 10:45 am by phone  Preventive Care 65 Years and Older, Male Preventive care refers to lifestyle choices and visits with your health care provider that can promote health and wellness. What does preventive care include? A yearly physical exam. This is also called an annual well check. Dental exams once or twice a year. Routine eye exams. Ask your health care provider how often you should have your eyes checked. Personal lifestyle choices, including: Daily care of your teeth and gums. Regular physical activity. Eating a healthy diet. Avoiding tobacco and drug use. Limiting alcohol use. Practicing safe sex. Taking low doses of aspirin every day. Taking vitamin and mineral supplements as recommended by your health care provider. What happens during an annual well check? The services and screenings done by your health care provider during your annual well check will depend on your age, overall health, lifestyle risk factors, and family history of disease. Counseling  Your health care provider may ask you questions about your: Alcohol use. Tobacco use. Drug use. Emotional well-being. Home and relationship well-being. Sexual activity. Eating habits. History of  falls. Memory and ability to understand (cognition). Work and work Statistician. Screening  You may have the following tests or measurements: Height, weight, and BMI. Blood pressure. Lipid and cholesterol levels. These may be checked every 5 years, or more frequently if you are over 23 years old. Skin check. Lung cancer screening. You may have this screening every year starting at age 35 if you have a 30-pack-year history of smoking and currently smoke or have quit within the past 15 years. Fecal occult blood test (FOBT) of the stool. You may have this test every year starting at age 16. Flexible sigmoidoscopy or colonoscopy. You may have a sigmoidoscopy every 5 years or a colonoscopy every 10 years starting at age 78. Prostate cancer screening. Recommendations will vary depending on your family history and other risks. Hepatitis C blood test. Hepatitis B blood test. Sexually transmitted disease (STD) testing. Diabetes screening. This is done by checking your blood sugar (glucose) after you have not eaten for a while (fasting). You may have this done every 1-3 years. Abdominal aortic aneurysm (AAA) screening. You may need this if you are a current or former smoker. Osteoporosis. You may be screened starting at age 21 if you are at high risk. Talk with your health care provider about your test results, treatment options, and if necessary, the need for more tests. Vaccines  Your health care provider may recommend certain vaccines, such as: Influenza vaccine. This is recommended every year. Tetanus, diphtheria, and acellular pertussis (Tdap, Td) vaccine. You may need a Td booster every 10 years. Zoster vaccine. You may need this after age 3. Pneumococcal 13-valent conjugate (PCV13) vaccine. One dose is recommended after age 82. Pneumococcal polysaccharide (PPSV23) vaccine. One dose is recommended after age 31. Talk to your health care provider  about which screenings and vaccines you need and  how often you need them. This information is not intended to replace advice given to you by your health care provider. Make sure you discuss any questions you have with your health care provider. Document Released: 02/19/2015 Document Revised: 10/13/2015 Document Reviewed: 11/24/2014 Elsevier Interactive Patient Education  2017 [place] Prevention in the Home Falls can cause injuries. They can happen to people of all ages. There are many things you can do to make your home safe and to help prevent falls. What can I do on the outside of my home? Regularly fix the edges of walkways and driveways and fix any cracks. Remove anything that might make you trip as you walk through a door, such as a raised step or threshold. Trim any bushes or trees on the path to your home. Use bright outdoor lighting. Clear any walking paths of anything that might make someone trip, such as rocks or tools. Regularly check to see if handrails are loose or broken. Make sure that both sides of any steps have handrails. Any raised decks and porches should have guardrails on the edges. Have any leaves, snow, or ice cleared regularly. Use sand or salt on walking paths during winter. Clean up any spills in your garage right away. This includes oil or grease spills. What can I do in the bathroom? Use night lights. Install grab bars by the toilet and in the tub and shower. Do not use towel bars as grab bars. Use non-skid mats or decals in the tub or shower. If you need to sit down in the shower, use a plastic, non-slip stool. Keep the floor dry. Clean up any water that spills on the floor as soon as it happens. Remove soap buildup in the tub or shower regularly. Attach bath mats securely with double-sided non-slip rug tape. Do not have throw rugs and other things on the floor that can make you trip. What can I do in the bedroom? Use night lights. Make sure that you have a light by your bed that is easy to  reach. Do not use any sheets or blankets that are too big for your bed. They should not hang down onto the floor. Have a firm chair that has side arms. You can use this for support while you get dressed. Do not have throw rugs and other things on the floor that can make you trip. What can I do in the kitchen? Clean up any spills right away. Avoid walking on wet floors. Keep items that you use a lot in easy-to-reach places. If you need to reach something above you, use a strong step stool that has a grab bar. Keep electrical cords out of the way. Do not use floor polish or wax that makes floors slippery. If you must use wax, use non-skid floor wax. Do not have throw rugs and other things on the floor that can make you trip. What can I do with my stairs? Do not leave any items on the stairs. Make sure that there are handrails on both sides of the stairs and use them. Fix handrails that are broken or loose. Make sure that handrails are as long as the stairways. Check any carpeting to make sure that it is firmly attached to the stairs. Fix any carpet that is loose or worn. Avoid having throw rugs at the top or bottom of the stairs. If you do have throw rugs, attach them to the floor  with carpet tape. Make sure that you have a light switch at the top of the stairs and the bottom of the stairs. If you do not have them, ask someone to add them for you. What else can I do to help prevent falls? Wear shoes that: Do not have high heels. Have rubber bottoms. Are comfortable and fit you well. Are closed at the toe. Do not wear sandals. If you use a stepladder: Make sure that it is fully opened. Do not climb a closed stepladder. Make sure that both sides of the stepladder are locked into place. Ask someone to hold it for you, if possible. Clearly mark and make sure that you can see: Any grab bars or handrails. First and last steps. Where the edge of each step is. Use tools that help you move  around (mobility aids) if they are needed. These include: Canes. Walkers. Scooters. Crutches. Turn on the lights when you go into a dark area. Replace any light bulbs as soon as they burn out. Set up your furniture so you have a clear path. Avoid moving your furniture around. If any of your floors are uneven, fix them. If there are any pets around you, be aware of where they are. Review your medicines with your doctor. Some medicines can make you feel dizzy. This can increase your chance of falling. Ask your doctor what other things that you can do to help prevent falls. This information is not intended to replace advice given to you by your health care provider. Make sure you discuss any questions you have with your health care provider. Document Released: 11/19/2008 Document Revised: 07/01/2015 Document Reviewed: 02/27/2014 Elsevier Interactive Patient Education  2017 [employer].

## 2022-01-24 NOTE — Progress Notes (Signed)
Virtual Visit via Telephone Note  I connected with  Antonio [name] on 01/24/22 at  3:30 PM EST by telephone and verified that I am speaking with the correct person using two identifiers.  Location: Patient: home Provider: [place] Persons participating in the virtual visit: [place]   I discussed the limitations, risks, security and privacy concerns of performing an evaluation and management service by telephone and the availability of in person appointments. The patient expressed understanding and agreed to proceed.  Interactive audio and video telecommunications were attempted between this nurse and patient, however failed, due to patient having technical difficulties OR patient did not have access to video capability.  We continued and completed visit with audio only.  Some vital signs may be absent or patient reported.   Dionisio David, LPN  Subjective:   Antonio [name] is a 76 y.o. male who presents for Medicare Annual/Subsequent preventive examination.  Review of Systems     Cardiac Risk Factors include: advanced age (>74mn, >>30women);diabetes mellitus;hypertension;dyslipidemia;male gender     Objective:    There were no vitals filed for this visit. There is no height or weight on file to calculate BMI.     01/24/2022    3:34 PM 12/14/2021    9:25 AM 11/17/2021   10:51 AM 08/17/2021    1:02 PM 06/01/2021   10:15 AM 05/18/2021   11:29 AM 05/11/2021   10:36 AM  Advanced Directives  Does Patient Have a Medical Advance Directive? No _0  Yes  Type of ACorporate treasurerof AOak[place]Living will [place]Living will [place]Living will [place]Living will HM[place]Living will   Does patient want to make changes to medical advance directive?   No - Patient declined No - Patient declined No - Patient declined No - Guardian declined   Copy of  HFancy Farmin Chart?   No - copy requested No - copy requested No - copy requested No - copy requested   Would patient like information on creating a medical advance directive? No - Patient declined          Current Medications (verified) Outpatient Encounter Medications as of 01/24/2022  Medication Sig   acetaminophen (TYLENOL) 500 MG tablet Take 1,000 mg by mouth every 6 (six) hours as needed (hip pain.). Rapid release   aspirin 81 MG EC tablet Take 81 mg by mouth in the morning.   atorvastatin (LIPITOR) 20 MG tablet TAKE 1 TABLET BY MOUTH IN THE EVENING FOR CHOLESTEROL   clopidogrel (PLAVIX) 75 MG tablet TAKE 1 TABLET BY MOUTH EVERY DAY   ferrous gluconate (FERGON) 324 MG tablet TAKE 1 TABLET BY MOUTH EVERY DAY WITH BREAKFAST   FIBER PO Take 1 capsule by mouth in the morning.   finasteride (PROSCAR) 5 MG tablet TAKE 1 TABLET BY MOUTH EVERY DAY   fluticasone (FLONASE) 50 MCG/ACT nasal spray SPRAY 2 SPRAYS INTO EACH NOSTRIL EVERY DAY   Fluticasone-Umeclidin-Vilant (TRELEGY ELLIPTA) 100-62.5-25 MCG/ACT AEPB Inhale 1 puff into the lungs daily.   glipiZIDE (GLUCOTROL) 10 MG tablet Take 10 mg by mouth daily before supper.    GLUCOSAMINE HCL PO Take 1 tablet by mouth daily.   insulin detemir (LEVEMIR) 100 UNIT/ML injection Inject 20 units at bedtime.   metFORMIN (GLUCOPHAGE) 500 MG tablet TAKE 2 TABLETS BY MOUTH TWICE A DAY WITH MEAL FOR DIABETES   pantoprazole (PROTONIX) 40  MG tablet Take 1 tablet (40 mg total) by mouth daily. For heartburn   sildenafil (VIAGRA) 100 MG tablet Take 1 tablet (100 mg total) by mouth daily as needed for erectile dysfunction.   tamsulosin (FLOMAX) 0.4 MG CAPS capsule TAKE 1 CAPSULE BY MOUTH 2 TIMES DAILY.   vitamin B-12 (CYANOCOBALAMIN) 1000 MCG tablet Take 1,000 mcg by mouth in the morning.   cephALEXin (KEFLEX) 500 MG capsule Take 1 capsule (500 mg total) by mouth 2 (two) times daily. (Patient not taking: Reported on 01/24/2022)   [DISCONTINUED]  Fluticasone-Umeclidin-Vilant (TRELEGY ELLIPTA) 100-62.5-25 MCG/ACT AEPB Inhale 1 puff into the lungs daily. (Patient not taking: Reported on 01/05/2022)   No facility-administered encounter medications on file as of 01/24/2022.    Allergies (verified) Patient has no known allergies.   History: Past Medical History:  Diagnosis Date   Anemia    Arthritis    Asthma    age 75   BPH with urinary obstruction    stable on flomax ([name]   Coronary atherosclerosis of unspecified type of vessel, native or graft    Diabetes mellitus    Type II   Emphysema of lung (HCC)    Esophageal stricture    Gastritis    GERD (gastroesophageal reflux disease)    Hiatal hernia    Hx of colonic polyps    Hyperlipidemia    Hypertension    Internal hemorrhoids without mention of complication    Lumbago    Pain in both lower legs    Pneumonia    PSA elevation    now averaging 2's   Tobacco abuse    URI (upper respiratory infection)    Urinary retention    Past Surgical History:  Procedure Laterality Date   2D Echo  06/03   Abdominal ultrasound  06/03   Negative   BACK SURGERY     COLONOSCOPY     CT of head  and sinuses  06/03   Negative   CT of the chest, abdomen, pelvis  06/03   Chest negative; Abdomen, left adrenal lesion; Pelvis, enlarged prostate   ESOPHAGOGASTRODUODENOSCOPY  02/13/07   gastritis and duodenitis without bleed   ESOPHAGOGASTRODUODENOSCOPY N/A 06/26/2014   Procedure: ESOPHAGOGASTRODUODENOSCOPY (EGD);  Surgeon: Milus Banister, MD;  Location: [facility];  Service: Endoscopy;  Laterality: N/A;   ESOPHAGOGASTRODUODENOSCOPY (EGD) WITH PROPOFOL N/A 04/08/2020   Procedure: ESOPHAGOGASTRODUODENOSCOPY (EGD) WITH PROPOFOL;  Surgeon: Ladene Artist, MD;  Location: [facility];  Service: Endoscopy;  Laterality: N/A;   HOT HEMOSTASIS N/A 04/08/2020   Procedure: HOT HEMOSTASIS (ARGON PLASMA COAGULATION/BICAP);  Surgeon: Ladene Artist, MD;  Location: [facility];  Service:  Endoscopy;  Laterality: N/A;   KNEE ARTHROSCOPY  10/00   Right   LEFT HEART CATH AND CORONARY ANGIOGRAPHY N/A 05/23/2021   Procedure: LEFT HEART CATH AND CORONARY ANGIOGRAPHY;  Surgeon: [name], MD;  Location: [facility];  Service: Cardiovascular;  Laterality: N/A;   LOWER EXTREMITY ANGIOGRAPHY Right 12/27/2017   Procedure: LOWER EXTREMITY ANGIOGRAPHY;  Surgeon: Algernon Huxley, MD;  Location: [place] [facility];  Service: Cardiovascular;  Laterality: Right;   LOWER EXTREMITY ANGIOGRAPHY Left 01/10/2018   Procedure: LOWER EXTREMITY ANGIOGRAPHY;  Surgeon: Algernon Huxley, MD;  Location: [place];  Service: Cardiovascular;  Laterality: Left;   LOWER EXTREMITY ANGIOGRAPHY Left 05/29/2018   Procedure: LOWER EXTREMITY ANGIOGRAPHY;  Surgeon: Algernon Huxley, MD;  Location: [place] [facility];  Service: Cardiovascular;  Laterality: Left;  LOWER EXTREMITY ANGIOGRAPHY Left 05/30/2018   Procedure: Lower Extremity Angiography;  Surgeon: Algernon Huxley, MD;  Location: [place] [facility];  Service: Cardiovascular;  Laterality: Left;   LP  06/03   Microwave thermotherapy prostate  08/28/07   [place]   [place] cardiolite  03/01   EF 68%   POLYPECTOMY  11/95   Stress myoview  07/06   EF 67%   UPPER GASTROINTESTINAL ENDOSCOPY     Family History  Problem Relation Age of Onset   Diabetes Father    Alcohol abuse Paternal Uncle    Alcohol abuse Paternal Uncle    Alcohol abuse Paternal Uncle    Rheum arthritis Mother    Heart disease Neg Hx    Stroke Neg Hx    Cancer Neg Hx    Drug abuse Neg Hx    Depression Neg Hx    Colon cancer Neg Hx    Rectal cancer Neg Hx    Stomach cancer Neg Hx    Kidney cancer Neg Hx    Bladder Cancer Neg Hx    Prostate cancer Neg Hx    Social History   Socioeconomic History   Marital status: Widowed    Spouse name: [name]   Number of children: 3   Years of education: Not on file   Highest education level: Not on file  Occupational History    Occupation: Retired Games developer  Tobacco Use   Smoking status: Former    Packs/day: 2.00    Years: 57.00    Total pack years: 114.00    Types: Cigarettes    Quit date: 05/11/2021    Years since quitting: 0.7   Smokeless tobacco: Former    Types: Chew   Tobacco comments:    0.5 PPD since 04/20/2021  Vaping Use   Vaping Use: Never used  Substance and Sexual Activity   Alcohol use: Not Currently    Alcohol/week: 0.0 standard drinks of alcohol    Comment: rare   Drug use: No   Sexual activity: Yes  Other Topics Concern   Not on file  Social History Narrative   Married 9/09 wife with moderate memory problems.   3 adult children, 2 grandchildren   Would desire CPR   Social Determinants of Health   Financial Resource Strain: Low Risk  (01/24/2022)   Overall Financial Resource Strain (CARDIA)    Difficulty of Paying Living Expenses: Not hard at all  Food Insecurity: No Food Insecurity (01/24/2022)   Hunger Vital Sign    Worried About Running Out of Food in the Last Year: Never true    Ran Out of Food in the Last Year: Never true  Transportation Needs: No Transportation Needs (01/24/2022)   PRAPARE - Hydrologist (Medical): No    Lack of Transportation (Non-Medical): No  Physical Activity: Insufficiently Active (01/24/2022)   Exercise Vital Sign    Days of Exercise per Week: 3 days    Minutes of Exercise per Session: 30 min  Stress: No Stress Concern Present (01/24/2022)   [place]    Feeling of Stress : Not at all  Social Connections: Moderately Isolated (01/24/2022)   Social Connection and Isolation Panel [NHANES]    Frequency of Communication with Friends and Family: More than three times a week    Frequency of Social Gatherings with Friends and Family: Once a week    Attends Religious Services: More than 4 times per  year    Active Member of Clubs or Organizations: No    Attends  Archivist Meetings: Never    Marital Status: Widowed    Tobacco Counseling Counseling given: Not Answered Tobacco comments: 0.5 PPD since 04/20/2021   Clinical Intake:  Pre-visit preparation completed: Yes  Pain : No/denies pain     Diabetes: Yes CBG done?: No Did pt. bring in CBG monitor from home?: No  How often do you need to have someone help you when you read instructions, pamphlets, or other written materials from your doctor or pharmacy?: 1 - Never  Diabetic?yes Nutrition Risk Assessment:  Has the patient had any N/V/D within the last 2 months?  Yes  Does the patient have any non-healing wounds?  No  Has the patient had any unintentional weight loss or weight gain?  No   Diabetes:  Is the patient diabetic?  Yes  If diabetic, was a CBG obtained today?  No  Did the patient bring in their glucometer from home?  No  How often do you monitor your CBG's? Every am and pm.   Financial Strains and Diabetes Management:  Are you having any financial strains with the device, your supplies or your medication? No .  Does the patient want to be seen by Chronic Care Management for management of their diabetes?  No  Would the patient like to be referred to a Nutritionist or for Diabetic Management?  No   Diabetic Exams:  Diabetic Eye Exam: Completed 06/20/21. [name] Pt has been advised about the importance in completing this exam.  Diabetic Foot Exam: Completed 05/16/18. Pt has been advised about the importance in completing this exam.    Interpreter Needed?: No  Information entered by :: Kirke Shaggy, LPN   Activities of Daily Living    01/24/2022    3:36 PM 05/11/2021   10:38 AM  In your present state of health, do you have any difficulty performing the following activities:  Hearing? 1   Vision? 0   Difficulty concentrating or making decisions? 0   Walking or climbing stairs? 1   Dressing or bathing? 0   Doing errands, shopping? 0 0  Preparing Food and  eating ? N   Using the Toilet? N   In the past six months, have you accidently leaked urine? N   Do you have problems with loss of bowel control? N   Managing your Medications? N   Managing your Finances? N   Housekeeping or managing your Housekeeping? N     Patient Care Team: Pleas Koch, NP as PCP - General (Internal Medicine) Kate Sable, MD as PCP - Cardiology (Cardiology) Laneta Simmers as Physician Assistant (Urology) Ladene Artist, MD as Consulting Physician (Gastroenterology) [name], [facility] as Pharmacist (Pharmacist)  Indicate any recent Medical Services you may have received from other than [facility] providers in the past year (date may be approximate).     Assessment:   This is a routine wellness examination for Eugenio.  Hearing/Vision screen Hearing Screening - Comments:: No aids Vision Screening - Comments:: Readers- [facility]  Dietary issues and exercise activities discussed: Current Exercise Habits: Home exercise routine, Type of exercise: walking, Time (Minutes): 30, Frequency (Times/Week): 3, Weekly Exercise (Minutes/Week): 90, Intensity: Mild   Goals Addressed             This Visit's Progress    DIET - EAT MORE FRUITS AND VEGETABLES         Depression  Screen    01/24/2022    3:33 PM 05/12/2021   10:29 AM 01/20/2021    1:24 PM 12/02/2019    8:31 AM 09/23/2018    9:07 AM 09/14/2017    8:08 AM 07/12/2016    9:22 AM  PHQ 2/9 Scores  PHQ - 2 Score 0 0 0 3 0 0 0  PHQ- 9 Score 0   6 6 0     Fall Risk    01/24/2022    3:35 PM 05/12/2021   10:29 AM 01/20/2021    1:23 PM 10/17/2019    9:38 AM 01/01/2019   10:22 AM  Fall Risk   Falls in the past year? 1 0 1 1 0  Comment     Emmi Telephone Survey: data to providers prior to load  Number falls in past yr: 1  1    Injury with Fall? 1  0    Risk for fall due to : No Fall Risks;History of fall(s);Impaired balance/gait      Follow up Falls prevention discussed;Falls evaluation  completed Falls evaluation completed Falls prevention discussed      FALL RISK PREVENTION PERTAINING TO THE HOME:  Any stairs in or around the home? No  If so, are there any without handrails? No  Home free of loose throw rugs in walkways, pet beds, electrical cords, etc? Yes  Adequate lighting in your home to reduce risk of falls? Yes   ASSISTIVE DEVICES UTILIZED TO PREVENT FALLS:  Life alert? No  Use of a cane, walker or w/c? Yes  Grab bars in the bathroom? Yes  Shower chair or bench in shower? Yes  Elevated toilet seat or a handicapped toilet? Yes    Cognitive Function:    09/23/2018    9:11 AM 09/14/2017    8:11 AM 07/12/2016    9:26 AM  MMSE - Mini Mental State Exam  Orientation to time _0 Orientation to Place _1 Registration _2 Attention/ Calculation 5 0 0  Recall _3 Language- name 2 objects 0 0 0  Language- repeat 0 1 1  Language- follow 3 step command 0 3 3  Language- read & follow direction 0 0 0  Write a sentence 0 0 0  Copy design 0 0 0  Total score _4 01/24/2022    3:39 PM  6CIT Screen  What Year? 0 points  What month? 0 points  What time? 0 points  Count back from 20 0 points  Months in reverse 0 points  Repeat phrase 0 points  Total Score 0 points    Immunizations Immunization History  Administered Date(s) Administered   Moderna Sars-Covid-2 Vaccination 07/09/2019, 08/06/2019   Pneumococcal Conjugate-13 09/30/2018   Td 10/08/1994, 11/19/2006    TDAP status: Due, Education has been provided regarding the importance of this vaccine. Advised may receive this vaccine at local pharmacy or Health Dept. Aware to provide a copy of the vaccination record if obtained from local pharmacy or Health Dept. Verbalized acceptance and understanding.  Flu Vaccine status: Declined, Education has been provided regarding the importance of this vaccine but patient still declined. Advised may receive this vaccine at local pharmacy or  Health Dept. Aware to provide a copy of the vaccination record if obtained from local pharmacy or Health Dept. Verbalized acceptance and understanding.  Pneumococcal vaccine status: Due, Education has been provided regarding the importance of  this vaccine. Advised may receive this vaccine at local pharmacy or Health Dept. Aware to provide a copy of the vaccination record if obtained from local pharmacy or Health Dept. Verbalized acceptance and understanding.  Covid-19 vaccine status: Completed vaccines  Qualifies for Shingles Vaccine? Yes   Zostavax completed No   Shingrix Completed?: No.    Education has been provided regarding the importance of this vaccine. Patient has been advised to call insurance company to determine out of pocket expense if they have not yet received this vaccine. Advised may also receive vaccine at local pharmacy or Health Dept. Verbalized acceptance and understanding.  Screening Tests Health Maintenance  Topic Date Due   Diabetic kidney evaluation - Urine ACR  12/23/2015   DTaP/Tdap/Td (3 - Tdap) 11/18/2016   FOOT EXAM  05/16/2019   COVID-19 Vaccine (3 - Moderna risk series) 09/03/2019   HEMOGLOBIN A1C  11/11/2021   Zoster Vaccines- Shingrix (1 of 2) 03/23/2022 (Originally 12/25/1964)   Pneumonia Vaccine 11+ Years old (2 - PPSV23 or PCV20) 05/07/2022 (Originally 09/30/2019)   INFLUENZA VACCINE  01/21/2057 (Originally 09/06/2021)   OPHTHALMOLOGY EXAM  06/21/2022   COLONOSCOPY (Pts 45-13yr Insurance coverage will need to be confirmed)  12/11/2022   Diabetic kidney evaluation - eGFR measurement  12/15/2022   Medicare Annual Wellness (AWV)  01/25/2023   Hepatitis C Screening  Completed   HPV VACCINES  Aged Out    Health Maintenance  Health Maintenance Due  Topic Date Due   Diabetic kidney evaluation - Urine ACR  12/23/2015   DTaP/Tdap/Td (3 - Tdap) 11/18/2016   FOOT EXAM  05/16/2019   COVID-19 Vaccine (3 - Moderna risk series) 09/03/2019   HEMOGLOBIN A1C   11/11/2021    Colorectal cancer screening: Type of screening: Colonoscopy. Completed 12/11/19. Repeat every 3 years  Lung Cancer Screening: (Low Dose CT Chest recommended if Age 77-80years, 30 pack-year currently smoking OR have quit w/in 15years.) does qualify.   Lung Cancer Screening Referral: has had in April  Additional Screening:  Hepatitis C Screening: does qualify; Completed 07/12/16  Vision Screening: Recommended annual ophthalmology exams for early detection of glaucoma and other disorders of the eye. Is the patient up to date with their annual eye exam?  Yes  Who is the provider or what is the name of the office in which the patient attends annual eye exams[place] is not established with a provider, would they like to be referred to a provider to establish care? No .   Dental Screening: Recommended annual dental exams for proper oral hygiene  Community Resource Referral / Chronic Care Management: CRR required this visit?  No   CCM required this visit?  No      Plan:     I have personally reviewed and noted the following in the patient's chart:   Medical and social history Use of alcohol, tobacco or illicit drugs  Current medications and supplements including opioid prescriptions. Patient is not currently taking opioid prescriptions. Functional ability and status Nutritional status Physical activity Advanced directives List of other physicians Hospitalizations, surgeries, and ER visits in previous 12 months Vitals Screenings to include cognitive, depression, and falls Referrals and appointments  In addition, I have reviewed and discussed with patient certain preventive protocols, quality metrics, and best practice recommendations. A written personalized care plan for preventive services as well as general preventive health recommendations were provided to patient.     LDionisio David LPN   [id number]  Nurse  Notes: none

## 2022-02-20 DIAGNOSIS — E1159 Type 2 diabetes mellitus with other circulatory complications: Secondary | ICD-10-CM | POA: Diagnosis not present

## 2022-02-20 DIAGNOSIS — E1165 Type 2 diabetes mellitus with hyperglycemia: Secondary | ICD-10-CM | POA: Diagnosis not present

## 2022-02-20 DIAGNOSIS — E1142 Type 2 diabetes mellitus with diabetic polyneuropathy: Secondary | ICD-10-CM | POA: Diagnosis not present

## 2022-02-20 DIAGNOSIS — R809 Proteinuria, unspecified: Secondary | ICD-10-CM | POA: Diagnosis not present

## 2022-02-20 DIAGNOSIS — Z794 Long term (current) use of insulin: Secondary | ICD-10-CM | POA: Diagnosis not present

## 2022-02-20 DIAGNOSIS — E1129 Type 2 diabetes mellitus with other diabetic kidney complication: Secondary | ICD-10-CM | POA: Diagnosis not present

## 2022-03-01 DIAGNOSIS — E1165 Type 2 diabetes mellitus with hyperglycemia: Secondary | ICD-10-CM | POA: Diagnosis not present

## 2022-03-08 ENCOUNTER — Telehealth: Payer: Self-pay | Admitting: Primary Care

## 2022-03-08 NOTE — Telephone Encounter (Signed)
Patient's wife called to schedule his yearly visit with Anda Kraft, was unable to tell just from looking at previous appointments when that should be, wife states he has a paper that says 2.21.24, but not scheduled in EPIC  Please advise so that we can schedule the patient

## 2022-03-09 NOTE — Telephone Encounter (Signed)
Would he be due in April 2024 for his annual follow up? Im having a hard time telling.

## 2022-03-09 NOTE — Telephone Encounter (Signed)
Left voicemail per dpr for patient to schedule yearly for June or July 2024

## 2022-03-09 NOTE — Telephone Encounter (Signed)
He saw me in November 2023, so we are good until June or July

## 2022-03-23 ENCOUNTER — Telehealth: Payer: Self-pay

## 2022-03-23 NOTE — Progress Notes (Signed)
Care Management & Coordination Services Pharmacy Team  Reason for Encounter: Appointment Reminder  Contacted patient to confirm telephone appointment with Charlene Brooke, PharmD on 03/29/2022 at 11:00.  Unsuccessful outreach. Left voicemail for patient to return call. Star Rating Drugs:  Medication:  Last Fill: Day Supply Atorvastatin 20 mg 01/22/2022 90 Glipizide 10 mg 01/05/2022 90 Metformin 500 mg 02/06/2022 90  Care Gaps: Annual wellness visit in last year? Yes 01/24/2022  If Diabetic: Last eye exam / retinopathy screening: Up to date Last diabetic foot exam: Overdude  Charlene Brooke, PharmD notified  Marijean Niemann, [place] Assistant [phone]

## 2022-03-29 ENCOUNTER — Ambulatory Visit: Payer: Medicare Other | Admitting: Pharmacist

## 2022-03-29 ENCOUNTER — Encounter: Payer: Self-pay | Admitting: Pharmacist

## 2022-03-29 NOTE — Progress Notes (Signed)
Care Management & Coordination Services Pharmacy Note  03/29/2022 Name:  Antonio [name] MRN:  [id number] DOB:  Jul 03, 1945  Summary: F/U visit -HTN: Pt reports he is taking lisinopril-HCTZ currently, which is not on his list because cardiologist stopped in Nov 2023 due to orthostasis; pt reports dizziness has resolved, though he has not been checking BP; he has upcoming appt with cardiology 3/7, discussed it would be reasonable to continue lisinopril/hctz until then -DM: A1c 9.9% (12/2021 - endo); pt recently started CGM through endocrine and reports glucose is "up and down" but overall improved; he reports one reading < 70  Recommendations/Changes made from today's visit: -No med changes;  -Follow up with cardiology re: lisinopril/hctz -follow up with endocrine for CGM review/adjustments  Follow up plan: -Health Concierge will call patient 1 month for DM update -Pharmacist follow up televisit scheduled for 3 months -Cardiology appt 04/13/22; Vascular appt 05/16/22; Oncology appt 05/18/22 -Annual PCP due June-July 2024    Subjective: Antonio [name] is an 77 y.o. year old male who is a primary patient of Pleas Koch, NP.  The care coordination team was consulted for assistance with disease management and care coordination needs.    Engaged with patient by telephone for follow up visit.  Recent office visits: 12/21/21 NP Allie Bossier OV: recent A1c 9.9%, f/u with endo. BP elevated, f/u with cardiology. Avoiding BB and nonDHP-CCB d/t hx heart block.  Recent consult visits: 03/23/22 - [facility] changed Levemir to [facility]  02/20/22 PA Gonzalez-Hernandez (Endocrine): ordered FL3 to [employer].  01/05/22 Dr Gabriel Carina (Endocrine): DM - A1c 9.9%; Increase Levemir to 26 units.  01/05/22 Dr Caryl Comes (Cardiology): f/u- c/o fatigue. Use zio patch. Orthostasis - BP dropped 130 to 105. D/C HCTZ. Start abdominal binder.   11/04/21 Dr Patsey Berthold (Pulmonology): COPD - refilled Trelegy. Ordered PFT.  Hospital  visits: 12/14/21 ED visit [facility]): UTI - rx keflex.   Objective:  Lab Results  Component Value Date   CREATININE 1.61 (H) 12/14/2021   BUN 34 (H) 12/14/2021   GFR 59.99 (L) 12/02/2019   GFRNONAA 44 (L) 12/14/2021   GFRAA >60 05/31/2018   NA 136 12/14/2021   K 4.3 12/14/2021   CALCIUM 9.0 12/14/2021   CO2 21 (L) 12/14/2021   GLUCOSE 326 (H) 12/14/2021    Lab Results  Component Value Date/Time   HGBA1C 7.4 (A) 05/12/2021 10:38 AM   HGBA1C 6.8 (A) 12/02/2019 08:32 AM   HGBA1C 7.6 (H) 09/24/2018 08:19 AM   HGBA1C 7.6 03/16/2017 12:00 AM   HGBA1C 8.9 (H) 07/12/2016 09:43 AM   GFR 59.99 (L) 12/02/2019 08:53 AM   GFR 63.48 09/24/2018 08:19 AM   MICROALBUR 6.8 (H) 12/23/2014 08:08 AM   MICROALBUR 1.3 11/12/2007 09:24 AM    Last diabetic Eye exam:  Lab Results  Component Value Date/Time   HMDIABEYEEXA Retinopathy (A) 06/20/2021 12:00 AM    Last diabetic Foot exam:  Lab Results  Component Value Date/Time   HMDIABFOOTEX Yes 11/12/2007 12:00 AM     Lab Results  Component Value Date   CHOL 102 12/02/2019   HDL 38.50 (L) 12/02/2019   LDLCALC 37 12/02/2019   LDLDIRECT 120.3 04/24/2011   TRIG 131.0 12/02/2019   CHOLHDL 3 12/02/2019       Latest Ref Rng & Units 12/14/2021    9:26 AM 12/02/2019    8:53 AM 09/24/2018    8:19 AM  Hepatic Function  Total Protein 6.5 - 8.1 g/dL 7.0  6.2  6.6   Albumin 3.5 -  5.0 g/dL 3.6  4.1  4.2   AST 15 - 41 U/L 37  11  9   ALT 0 - 44 U/L 23  9  9    Alk Phosphatase 38 - 126 U/L 48  42  52   Total Bilirubin 0.3 - 1.2 mg/dL 0.9  0.3  0.4     Lab Results  Component Value Date/Time   TSH 2.038 06/09/2021 11:04 AM   TSH 1.11 09/14/2017 08:30 AM   TSH 1.32 07/12/2016 09:43 AM       Latest Ref Rng & Units 12/14/2021    9:26 AM 05/20/2021    2:13 PM 05/11/2021    3:14 PM  CBC  WBC 4.0 - 10.5 K/uL 4.9  10.3  9.3   Hemoglobin 13.0 - 17.0 g/dL 14.1  11.8  12.8   Hematocrit 39.0 - 52.0 % 43.1  36.4  38.8   Platelets 150 - 400 K/uL 148  223   280     No results found for: "VD25OH", "VITAMINB12"  Clinical ASCVD: Yes  The ASCVD Risk score (Arnett DK, et al., 2019) failed to calculate for the following reasons:   The valid total cholesterol range is 130 to 320 mg/dL       01/24/2022    3:33 PM 05/12/2021   10:29 AM 01/20/2021    1:24 PM  Depression screen PHQ 2/9  Decreased Interest 0 0 0  Down, Depressed, Hopeless 0 0 0  PHQ - 2 Score 0 0 0  Altered sleeping 0    Tired, decreased energy 0    Change in appetite 0    Feeling bad or failure about yourself  0    Trouble concentrating 0    Moving slowly or fidgety/restless 0    Suicidal thoughts 0    PHQ-9 Score 0    Difficult doing work/chores Not difficult at all       Social History   Tobacco Use  Smoking Status Former   Packs/day: 2.00   Years: 57.00   Total pack years: 114.00   Types: Cigarettes   Start date: 02/07/1964   Quit date: 05/11/2021   Years since quitting: 0.8  Smokeless Tobacco Former   Types: Chew   BP Readings from Last 3 Encounters:  01/05/22 (!) 172/68  12/21/21 (!) 160/78  12/14/21 137/60   Pulse Readings from Last 3 Encounters:  01/05/22 (!) 50  12/21/21 (!) 41  12/14/21 86   Wt Readings from Last 3 Encounters:  01/24/22 178 lb (80.7 kg)  01/05/22 178 lb 4 oz (80.9 kg)  12/21/21 178 lb (80.7 kg)   BMI Readings from Last 3 Encounters:  01/24/22 28.73 kg/m  01/05/22 28.77 kg/m  12/21/21 28.73 kg/m    No Known Allergies  Medications Reviewed Today     Reviewed by Charlton Haws, RPH (Pharmacist) on 03/29/22 at 4  Med List Status: <None>   Medication Order Taking? Sig Documenting Provider Last Dose Status Informant  acetaminophen (TYLENOL) 500 MG tablet [id number] Yes Take 1,000 mg by mouth every 6 (six) hours as needed (hip pain.). Rapid release [provider] Taking Active Self  aspirin 81 MG EC tablet [id number] Yes Take 81 mg by mouth in the morning. [provider] Taking Active Self   atorvastatin (LIPITOR) 20 MG tablet [id number] Yes TAKE 1 TABLET BY MOUTH IN THE EVENING FOR CHOLESTEROL Pleas Koch, NP Taking Active   clopidogrel (PLAVIX) 75 MG tablet [id number] Yes TAKE 1 TABLET BY MOUTH  EVERY DAY Kris Hartmann, NP Taking Active   ferrous gluconate (FERGON) 324 MG tablet [id number] Yes TAKE 1 TABLET BY MOUTH EVERY DAY WITH BREAKFAST Ladene Artist, MD Taking Active   FIBER PO [id number] Yes Take 1 capsule by mouth in the morning. [provider] Taking Active Self  finasteride (PROSCAR) 5 MG tablet [id number] Yes TAKE 1 TABLET BY MOUTH EVERY DAY McGowan, Shannon A, PA-C Taking Active Self  fluticasone (FLONASE) 50 MCG/ACT nasal spray [id number] Yes SPRAY 2 SPRAYS INTO EACH NOSTRIL EVERY DAY Eugenia Pancoast, FNP Taking Active   Fluticasone-Umeclidin-Vilant (TRELEGY ELLIPTA) 100-62.5-25 MCG/ACT AEPB [id number] Yes Inhale 1 puff into the lungs daily. Tyler Pita, MD Taking Active   glipiZIDE (GLUCOTROL) 10 MG tablet [id number] Yes Take 10 mg by mouth daily before supper.  [provider] Taking Active Self           Med Note Cristela Felt, [facility]   Wed Jun 27, 2017  8:06 AM)    GLUCOSAMINE HCL PO [id number] Yes Take 1 tablet by mouth daily. [provider] Taking Active Self  insulin detemir (LEVEMIR) 100 UNIT/ML injection [id number] Yes Inject 20 units at bedtime. Lucille Passy, MD Taking Active Self           Med Note Andree Elk, [facility]   Mon Mar 28, 2021 11:41 AM)    lisinopril-hydrochlorothiazide (ZESTORETIC) 20-25 MG tablet [id number] Yes Take 1 tablet by mouth daily. [provider] Taking Active   metFORMIN (GLUCOPHAGE) 500 MG tablet [id number] Yes TAKE 2 TABLETS BY MOUTH TWICE A DAY WITH MEAL FOR DIABETES Pleas Koch, NP Taking Active Self  pantoprazole (PROTONIX) 40 MG tablet [id number] Yes Take 1 tablet (40 mg total) by mouth daily. For heartburn Pleas Koch, NP Taking Active Self  sildenafil (VIAGRA) 100 MG tablet  [id number] Yes Take 1 tablet (100 mg total) by mouth daily as needed for erectile dysfunction. Nori Riis, PA-C Taking Active Self  tamsulosin (FLOMAX) 0.4 MG CAPS capsule [id number] Yes TAKE 1 CAPSULE BY MOUTH 2 TIMES DAILY. Nori Riis, PA-C Taking Active Self  vitamin B-12 (CYANOCOBALAMIN) 1000 MCG tablet [id number] Yes Take 1,000 mcg by mouth in the morning. [provider] Taking Active Self            SDOH:  (Social Determinants of Health) assessments and interventions performed: No SDOH Interventions    Flowsheet Row Clinical Support from 01/24/2022 in [place] at [place] Management from 03/28/2021 in [place] at [place] from 01/20/2021 in [place] at [place] Management from 06/24/2020 in [place] at [facility] Visit from 12/02/2019 in [place] at [place] Interventions Intervention Not Indicated -- -- -- --  Housing Interventions Intervention Not Indicated -- -- -- --  Transportation Interventions Intervention Not Indicated -- -- -- --  Utilities Interventions Intervention Not Indicated -- -- -- --  Alcohol Usage Interventions Intervention Not Indicated (Score <7) -- -- -- --  Depression Interventions/Treatment  -- -- -- -- Counseling  Financial Strain Interventions Intervention Not Indicated Intervention Not Indicated Other (Comment)  [CCR] Intervention Not Indicated --  Physical Activity Interventions Intervention Not Indicated -- -- -- --  Stress Interventions Intervention Not Indicated -- -- -- --  Social Connections Interventions Intervention Not Indicated -- -- -- --  Medication Assistance: None required.  Patient affirms current coverage meets needs.  Medication Access: Within the past 30 days, how often has patient missed a  dose of medication? 0 Is a pillbox or other method used to improve adherence? Yes  Factors that may affect medication adherence? lack of understanding of disease management Are meds synced by current pharmacy? No  Are meds delivered by current pharmacy? No  Does patient experience delays in picking up medications due to transportation concerns? No   [facility] Services Reviewed: Is patient disadvantaged to use [facility]?: Yes  Current Rx insurance plan: [facility] Name and location of Current pharmacy:  [id number] [place], [address] [place] [address] Phone: [phone] Fax: [phone]  [facility] services reviewed with patient today?: No  Patient requests to transfer care to [facility]?: No  Reason patient declined to change pharmacies: Disadvantaged due to insurance/mail order  Compliance/Adherence/Medication fill history: Care Gaps: UACR (due 12/2015) Foot exam (due 05/2019) A1c (due 11/2021)  Star-Rating Drugs: Atorvastatin - PDC 100% Glipizide - PDC 100% Metformin - PDC 100%   ASSESSMENT / PLAN  Hypertension (BP goal <140/90) -Query controlled - pt had issues with orthostatic hypotension at [place] in Nov 2023, was advised to stop lisinopril/hctz; pt reports he has been taking this still; he denies further dizziness, lightheadedness -Current home readings: no recent checks -Current treatment: Lisinopril-HCTZ 20-25 mg daily - Appropriate, Effective, Safe, Accessible -Medications previously tried: rampril, amlodipine, lisinopril/hctz, metoprolol -Reviewed cardiology's plan to hold HCTZ in s/o orthostasis, given his symptoms have resolved and he is taking the med, it is reasonable to continue until next OV -Counseled to monitor BP at home daily -Recommended to continue current medication  Diabetes (A1c goal <7%) -Uncontrolled - A1c 9.9% (12/2021) worsened; pt recently started CGM and has enjoyed it -Follows with  endocrine (Dr Gabriel Carina) -Current home glucose readings -  He reports 1 low reading (67) accompanied by dizziness, sweating. He treated with piece of candy and ate a meal soon after.  -Denies hypoglycemic/hyperglycemic symptoms -Current medications: Glipizide 10 mg daily PM - Appropriate, Query Effective Levemir - 30 units HS -Appropriate, Query Effective Metformin 500 mg - 2 tablets BID -Appropriate, Query Effective Freestyle Libre 3 (reader)- Appropriate, Effective, Safe, Accessible -Medications previously tried: none reported -Educated on A1c and blood sugar goals; -Pt aware bread, pasta will increase glucose -Recommended to continue current medication  Hyperlipidemia: (LDL goal < 70) -Query Controlled - LDL 37 (11/2019) at goal though pt is overdue for reassessment; he does endorse compliance with statin -Hx PAD, aortic atherosclerosis -Current treatment: Atorvastatin 20 mg daily - Appropriate, Effective, Safe, Accessible Clopidogrel 75 mg daily - Appropriate, Effective, Safe, Accessible Aspirin 81 mg daily - Appropriate, Effective, Safe, Accessible -Educated on Cholesterol goals;  -Recommended to continue current medication  Tobacco use (Goal: Tobacco free) -Pt quit smoking April 2023, congratulated.  Health Maintenance -Vaccine gaps: Shingrix, TDAP -Hx NSCLC - Dx 05/2021. S/p radiation. -Hx GERD, on PPI -Hx BPH, on flomax   Charlene Brooke, PharmD, BCACP Clinical Pharmacist [facility] at [facility] [phone]

## 2022-03-29 NOTE — Patient Instructions (Signed)
Visit Information  Phone number for Pharmacist: [phone]  Thank you for meeting with me to discuss your medications! Below is a summary of what we talked about during the visit:   Recommendations/Changes made from today's visit: -No med changes; follow up with endocrine for CGM review/adjustments  Follow up plan: -Health Concierge will call patient 1 month for DM update -Pharmacist follow up televisit scheduled for 3 months -Cardiology appt 04/13/22; Vascular appt 05/16/22; Oncology appt 05/18/22 -Annual PCP due June-July 2024   Charlene Brooke, PharmD, [place] Clinical Pharmacist [facility] at [facility] [phone]

## 2022-04-05 ENCOUNTER — Emergency Department: Payer: Medicare Other

## 2022-04-05 ENCOUNTER — Other Ambulatory Visit: Payer: Self-pay

## 2022-04-05 ENCOUNTER — Inpatient Hospital Stay
Admission: EM | Admit: 2022-04-05 | Discharge: 2022-04-08 | DRG: 034 | Disposition: A | Discharge status: Home / Self Care | Payer: Medicare Other | Attending: Student | Admitting: Student

## 2022-04-05 DIAGNOSIS — R531 Weakness: Secondary | ICD-10-CM | POA: Diagnosis not present

## 2022-04-05 DIAGNOSIS — I2510 Atherosclerotic heart disease of native coronary artery without angina pectoris: Secondary | ICD-10-CM | POA: Diagnosis not present

## 2022-04-05 DIAGNOSIS — B9620 Unspecified Escherichia coli [E. coli] as the cause of diseases classified elsewhere: Secondary | ICD-10-CM | POA: Diagnosis present

## 2022-04-05 DIAGNOSIS — Z85118 Personal history of other malignant neoplasm of bronchus and lung: Secondary | ICD-10-CM

## 2022-04-05 DIAGNOSIS — G9340 Encephalopathy, unspecified: Secondary | ICD-10-CM | POA: Diagnosis not present

## 2022-04-05 DIAGNOSIS — G319 Degenerative disease of nervous system, unspecified: Secondary | ICD-10-CM | POA: Diagnosis not present

## 2022-04-05 DIAGNOSIS — Z7902 Long term (current) use of antithrombotics/antiplatelets: Secondary | ICD-10-CM

## 2022-04-05 DIAGNOSIS — N390 Urinary tract infection, site not specified: Secondary | ICD-10-CM | POA: Diagnosis not present

## 2022-04-05 DIAGNOSIS — G9341 Metabolic encephalopathy: Secondary | ICD-10-CM | POA: Diagnosis present

## 2022-04-05 DIAGNOSIS — R2990 Unspecified symptoms and signs involving the nervous system: Secondary | ICD-10-CM | POA: Diagnosis not present

## 2022-04-05 DIAGNOSIS — K219 Gastro-esophageal reflux disease without esophagitis: Secondary | ICD-10-CM | POA: Diagnosis present

## 2022-04-05 DIAGNOSIS — I639 Cerebral infarction, unspecified: Secondary | ICD-10-CM | POA: Diagnosis not present

## 2022-04-05 DIAGNOSIS — R590 Localized enlarged lymph nodes: Secondary | ICD-10-CM | POA: Diagnosis present

## 2022-04-05 DIAGNOSIS — E785 Hyperlipidemia, unspecified: Secondary | ICD-10-CM | POA: Diagnosis present

## 2022-04-05 DIAGNOSIS — Z833 Family history of diabetes mellitus: Secondary | ICD-10-CM

## 2022-04-05 DIAGNOSIS — R739 Hyperglycemia, unspecified: Secondary | ICD-10-CM | POA: Diagnosis not present

## 2022-04-05 DIAGNOSIS — Z7982 Long term (current) use of aspirin: Secondary | ICD-10-CM

## 2022-04-05 DIAGNOSIS — Z0389 Encounter for observation for other suspected diseases and conditions ruled out: Secondary | ICD-10-CM | POA: Diagnosis not present

## 2022-04-05 DIAGNOSIS — Z87891 Personal history of nicotine dependence: Secondary | ICD-10-CM | POA: Diagnosis not present

## 2022-04-05 DIAGNOSIS — I10 Essential (primary) hypertension: Secondary | ICD-10-CM | POA: Diagnosis present

## 2022-04-05 DIAGNOSIS — N401 Enlarged prostate with lower urinary tract symptoms: Secondary | ICD-10-CM | POA: Diagnosis present

## 2022-04-05 DIAGNOSIS — R29818 Other symptoms and signs involving the nervous system: Secondary | ICD-10-CM | POA: Diagnosis not present

## 2022-04-05 DIAGNOSIS — Z79899 Other long term (current) drug therapy: Secondary | ICD-10-CM

## 2022-04-05 DIAGNOSIS — N138 Other obstructive and reflux uropathy: Secondary | ICD-10-CM | POA: Diagnosis present

## 2022-04-05 DIAGNOSIS — J439 Emphysema, unspecified: Secondary | ICD-10-CM | POA: Diagnosis present

## 2022-04-05 DIAGNOSIS — Z8261 Family history of arthritis: Secondary | ICD-10-CM | POA: Diagnosis not present

## 2022-04-05 DIAGNOSIS — R519 Headache, unspecified: Secondary | ICD-10-CM | POA: Diagnosis not present

## 2022-04-05 DIAGNOSIS — G8194 Hemiplegia, unspecified affecting left nondominant side: Secondary | ICD-10-CM | POA: Diagnosis present

## 2022-04-05 DIAGNOSIS — I442 Atrioventricular block, complete: Secondary | ICD-10-CM | POA: Diagnosis not present

## 2022-04-05 DIAGNOSIS — N179 Acute kidney failure, unspecified: Secondary | ICD-10-CM | POA: Diagnosis present

## 2022-04-05 DIAGNOSIS — R29898 Other symptoms and signs involving the musculoskeletal system: Secondary | ICD-10-CM | POA: Diagnosis not present

## 2022-04-05 DIAGNOSIS — Z794 Long term (current) use of insulin: Secondary | ICD-10-CM | POA: Diagnosis not present

## 2022-04-05 DIAGNOSIS — I6389 Other cerebral infarction: Secondary | ICD-10-CM | POA: Diagnosis not present

## 2022-04-05 DIAGNOSIS — Z8673 Personal history of transient ischemic attack (TIA), and cerebral infarction without residual deficits: Secondary | ICD-10-CM | POA: Diagnosis not present

## 2022-04-05 DIAGNOSIS — R29810 Facial weakness: Secondary | ICD-10-CM | POA: Diagnosis present

## 2022-04-05 DIAGNOSIS — I6521 Occlusion and stenosis of right carotid artery: Secondary | ICD-10-CM | POA: Diagnosis not present

## 2022-04-05 DIAGNOSIS — Z923 Personal history of irradiation: Secondary | ICD-10-CM | POA: Diagnosis not present

## 2022-04-05 DIAGNOSIS — M1990 Unspecified osteoarthritis, unspecified site: Secondary | ICD-10-CM | POA: Diagnosis present

## 2022-04-05 DIAGNOSIS — I6523 Occlusion and stenosis of bilateral carotid arteries: Secondary | ICD-10-CM

## 2022-04-05 DIAGNOSIS — E1165 Type 2 diabetes mellitus with hyperglycemia: Secondary | ICD-10-CM | POA: Diagnosis present

## 2022-04-05 DIAGNOSIS — Z7984 Long term (current) use of oral hypoglycemic drugs: Secondary | ICD-10-CM

## 2022-04-05 HISTORY — DX: Personal history of transient ischemic attack (TIA), and cerebral infarction without residual deficits: Z86.73

## 2022-04-05 LAB — COMPREHENSIVE METABOLIC PANEL
ALT: 13 U/L (ref 0–44)
AST: 19 U/L (ref 15–41)
Albumin: 3.4 g/dL — ABNORMAL LOW (ref 3.5–5.0)
Alkaline Phosphatase: 58 U/L (ref 38–126)
Anion gap: 11 (ref 5–15)
BUN: 19 mg/dL (ref 8–23)
CO2: 23 mmol/L (ref 22–32)
Calcium: 8.3 mg/dL — ABNORMAL LOW (ref 8.9–10.3)
Chloride: 98 mmol/L (ref 98–111)
Creatinine, Ser: 0.9 mg/dL (ref 0.61–1.24)
GFR, Estimated: >60 mL/min (ref >60)
Glucose, Bld: 378 mg/dL — ABNORMAL HIGH (ref 70–99)
Potassium: 4.4 mmol/L (ref 3.5–5.1)
Sodium: 132 mmol/L — ABNORMAL LOW (ref 135–145)
Total Bilirubin: 0.6 mg/dL (ref 0.3–1.2)
Total Protein: 6.3 g/dL — ABNORMAL LOW (ref 6.5–8.1)

## 2022-04-05 LAB — CBC
HCT: 43.1 % (ref 39.0–52.0)
Hemoglobin: 14.1 g/dL (ref 13.0–17.0)
MCH: 28.5 pg (ref 26.0–34.0)
MCHC: 32.7 g/dL (ref 30.0–36.0)
MCV: 87.2 fL (ref 80.0–100.0)
Platelets: 345 10*3/uL (ref 150–400)
RBC: 4.94 MIL/uL (ref 4.22–5.81)
RDW: 13.3 % (ref 11.5–15.5)
WBC: 12.1 10*3/uL — ABNORMAL HIGH (ref 4.0–10.5)
nRBC: 0 % (ref 0.0–0.2)

## 2022-04-05 LAB — URINE DRUG SCREEN, QUALITATIVE (ARMC ONLY)
Amphetamines, Ur Screen: NOT DETECTED
Barbiturates, Ur Screen: NOT DETECTED
Benzodiazepine, Ur Scrn: NOT DETECTED
Cannabinoid 50 Ng, Ur ~~LOC~~: NOT DETECTED
Cocaine Metabolite,Ur ~~LOC~~: NOT DETECTED
MDMA (Ecstasy)Ur Screen: NOT DETECTED
Methadone Scn, Ur: NOT DETECTED
Opiate, Ur Screen: NOT DETECTED
Phencyclidine (PCP) Ur S: NOT DETECTED
Tricyclic, Ur Screen: NOT DETECTED

## 2022-04-05 LAB — URINALYSIS, ROUTINE W REFLEX MICROSCOPIC
Bilirubin Urine: NEGATIVE
Glucose, UA: >=500 mg/dL — AB
Ketones, ur: 20 mg/dL — AB
Nitrite: NEGATIVE
Protein, ur: >=300 mg/dL — AB
Specific Gravity, Urine: 1.039 — ABNORMAL HIGH (ref 1.005–1.030)
WBC, UA: >50 WBC/hpf (ref 0–5)
pH: 6 (ref 5.0–8.0)

## 2022-04-05 LAB — CBC WITH DIFFERENTIAL/PLATELET
Abs Immature Granulocytes: 0.09 10*3/uL — ABNORMAL HIGH (ref 0.00–0.07)
Basophils Absolute: 0 10*3/uL (ref 0.0–0.1)
Basophils Relative: 0 %
Eosinophils Absolute: 0 10*3/uL (ref 0.0–0.5)
Eosinophils Relative: 0 %
HCT: 40.1 % (ref 39.0–52.0)
Hemoglobin: 13 g/dL (ref 13.0–17.0)
Immature Granulocytes: 1 %
Lymphocytes Relative: 3 %
Lymphs Abs: 0.4 10*3/uL — ABNORMAL LOW (ref 0.7–4.0)
MCH: 28.5 pg (ref 26.0–34.0)
MCHC: 32.4 g/dL (ref 30.0–36.0)
MCV: 87.9 fL (ref 80.0–100.0)
Monocytes Absolute: 0.2 10*3/uL (ref 0.1–1.0)
Monocytes Relative: 1 %
Neutro Abs: 12.6 10*3/uL — ABNORMAL HIGH (ref 1.7–7.7)
Neutrophils Relative %: 95 %
Platelets: 309 10*3/uL (ref 150–400)
RBC: 4.56 MIL/uL (ref 4.22–5.81)
RDW: 13.2 % (ref 11.5–15.5)
WBC: 13.3 10*3/uL — ABNORMAL HIGH (ref 4.0–10.5)
nRBC: 0 % (ref 0.0–0.2)

## 2022-04-05 LAB — CREATININE, SERUM
Creatinine, Ser: 1.12 mg/dL (ref 0.61–1.24)
GFR, Estimated: >60 mL/min (ref >60)

## 2022-04-05 LAB — CBG MONITORING, ED
Glucose-Capillary: 398 mg/dL — ABNORMAL HIGH (ref 70–99)
Glucose-Capillary: 374 mg/dL — ABNORMAL HIGH (ref 70–99)
Glucose-Capillary: 360 mg/dL — ABNORMAL HIGH (ref 70–99)

## 2022-04-05 LAB — PROTIME-INR
INR: 1.1 (ref 0.8–1.2)
Prothrombin Time: 13.6 seconds (ref 11.4–15.2)

## 2022-04-05 LAB — TROPONIN I (HIGH SENSITIVITY)
Troponin I (High Sensitivity): 18 ng/L — ABNORMAL HIGH (ref <18)
Troponin I (High Sensitivity): 22 ng/L — ABNORMAL HIGH (ref <18)

## 2022-04-05 LAB — APTT: aPTT: 30 seconds (ref 24–36)

## 2022-04-05 LAB — GLUCOSE, CAPILLARY
Glucose-Capillary: 266 mg/dL — ABNORMAL HIGH (ref 70–99)
Glucose-Capillary: 221 mg/dL — ABNORMAL HIGH (ref 70–99)

## 2022-04-05 LAB — ETHANOL: Alcohol, Ethyl (B): <10 mg/dL (ref <10)

## 2022-04-05 MED ORDER — LABETALOL HCL 5 MG/ML IV SOLN: 5.0000 mg | INTRAVENOUS | Status: DC | PRN

## 2022-04-05 MED ORDER — PANTOPRAZOLE SODIUM 40 MG PO TBEC
40.0000 mg | DELAYED_RELEASE_TABLET | Freq: Every day | ORAL | Status: DC
  Administered 2022-04-05 – 2022-04-06 (×2): 40 mg via ORAL
  Filled 2022-04-05 (×2): qty 1

## 2022-04-05 MED ORDER — INSULIN DETEMIR 100 UNIT/ML ~~LOC~~ SOLN
0.2000 [IU]/kg | Freq: Every day | SUBCUTANEOUS | Status: DC
  Administered 2022-04-06 – 2022-04-08 (×2): 16 [IU] via SUBCUTANEOUS
  Filled 2022-04-05 (×4): qty 0.16

## 2022-04-05 MED ORDER — SODIUM CHLORIDE 0.9 % IV SOLN
2.0000 g | INTRAVENOUS | Status: DC
  Administered 2022-04-06 – 2022-04-07 (×2): 2 g via INTRAVENOUS
  Filled 2022-04-05: qty 20
  Filled 2022-04-05: qty 2

## 2022-04-05 MED ORDER — INSULIN ASPART 100 UNIT/ML IJ SOLN
0.0000 [IU] | INTRAMUSCULAR | Status: DC
  Administered 2022-04-05: 8 [IU] via SUBCUTANEOUS
  Administered 2022-04-05: 15 [IU] via SUBCUTANEOUS
  Administered 2022-04-06 (×2): 8 [IU] via SUBCUTANEOUS
  Administered 2022-04-06 (×2): 3 [IU] via SUBCUTANEOUS
  Administered 2022-04-06: 8 [IU] via SUBCUTANEOUS
  Administered 2022-04-06 – 2022-04-07 (×2): 5 [IU] via SUBCUTANEOUS
  Administered 2022-04-07 (×2): 3 [IU] via SUBCUTANEOUS
  Administered 2022-04-07 – 2022-04-08 (×3): 2 [IU] via SUBCUTANEOUS
  Administered 2022-04-08: 3 [IU] via SUBCUTANEOUS
  Administered 2022-04-08: 8 [IU] via SUBCUTANEOUS
  Filled 2022-04-05 (×16): qty 1

## 2022-04-05 MED ORDER — ONDANSETRON HCL 4 MG/2ML IJ SOLN: 4.0000 mg | Freq: Once | INTRAMUSCULAR | Status: AC

## 2022-04-05 MED ORDER — LISINOPRIL-HYDROCHLOROTHIAZIDE 20-25 MG PO TABS: 1.0000 | ORAL_TABLET | Freq: Every day | ORAL | Status: DC

## 2022-04-05 MED ORDER — METOCLOPRAMIDE HCL 5 MG/ML IJ SOLN
10.0000 mg | Freq: Once | INTRAMUSCULAR | Status: AC
  Administered 2022-04-05: 10 mg via INTRAVENOUS
  Filled 2022-04-05: qty 2

## 2022-04-05 MED ORDER — SODIUM CHLORIDE 0.9 % IV SOLN
2.0000 g | Freq: Once | INTRAVENOUS | Status: AC
  Administered 2022-04-05: 2 g via INTRAVENOUS
  Filled 2022-04-05: qty 20

## 2022-04-05 MED ORDER — SODIUM CHLORIDE 0.9 % IV SOLN: INTRAVENOUS | Status: DC

## 2022-04-05 MED ORDER — ONDANSETRON HCL 4 MG PO TABS: 4.0000 mg | ORAL_TABLET | Freq: Four times a day (QID) | ORAL | Status: DC | PRN

## 2022-04-05 MED ORDER — IOHEXOL 350 MG/ML SOLN
100.0000 mL | Freq: Once | INTRAVENOUS | Status: AC | PRN
  Administered 2022-04-05: 100 mL via INTRAVENOUS

## 2022-04-05 MED ORDER — LISINOPRIL 20 MG PO TABS
20.0000 mg | ORAL_TABLET | Freq: Every day | ORAL | Status: DC
  Administered 2022-04-05: 20 mg via ORAL
  Filled 2022-04-05: qty 2

## 2022-04-05 MED ORDER — ONDANSETRON HCL 4 MG/2ML IJ SOLN: 4.0000 mg | Freq: Four times a day (QID) | INTRAMUSCULAR | Status: DC | PRN

## 2022-04-05 MED ORDER — HYDROCHLOROTHIAZIDE 25 MG PO TABS
25.0000 mg | ORAL_TABLET | Freq: Every day | ORAL | Status: DC
  Administered 2022-04-05: 25 mg via ORAL
  Filled 2022-04-05: qty 1

## 2022-04-05 MED ORDER — ENOXAPARIN SODIUM 40 MG/0.4ML IJ SOSY
40.0000 mg | PREFILLED_SYRINGE | INTRAMUSCULAR | Status: DC
  Administered 2022-04-05: 40 mg via SUBCUTANEOUS
  Filled 2022-04-05: qty 0.4

## 2022-04-05 MED ORDER — ASPIRIN 81 MG PO TBEC
81.0000 mg | DELAYED_RELEASE_TABLET | Freq: Every morning | ORAL | Status: DC
  Administered 2022-04-06: 81 mg via ORAL
  Filled 2022-04-05: qty 1

## 2022-04-05 MED ORDER — ONDANSETRON HCL 4 MG/2ML IJ SOLN
INTRAMUSCULAR | Status: AC
  Administered 2022-04-05: 4 mg via INTRAVENOUS
  Filled 2022-04-05: qty 2

## 2022-04-05 NOTE — Assessment & Plan Note (Signed)
PPI ?

## 2022-04-05 NOTE — ED Notes (Signed)
Pt A&Ox4 to all of my questions however is pretty lethargic still and falling asleep. Pt vitals currently stable. Family at bedside.

## 2022-04-05 NOTE — ED Triage Notes (Signed)
Pt here with vomiting, abd, and chest pain for a few days. Pt not able to verbalize exactly where pain is but states it is all over. Pt states one episode of diarrhea last night.

## 2022-04-05 NOTE — H&P (Signed)
History and Physical    Patient: Antonio [name] DOB: 01-Jun-1945 DOA: 04/05/2022 DOS: the patient was seen and examined on 04/05/2022 PCP: Pleas Koch, NP  Patient coming from: Home  Chief Complaint:  Chief Complaint  Patient presents with   Abdominal Pain   Chest Pain   HPI: Antonio [name] is a 77 y.o. male with medical history significant of multiple medical issues including type 2 diabetes, BPH, coronary artery disease, hypertension, GERD, hyperlipidemia presenting with encephalopathy, UTI, hyperglycemia.  Limited history in the setting of encephalopathy//lethargy.  History primarily from ER physician, nursing as well as prior chart.  Patient reported new onset left-sided weakness prior to presentation.  Patient initially presented with headache nausea and vomiting.  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  Last known normal was approximately last night.  No reported fevers or chills no reported nausea or vomiting.  Noted confusion on presentation.  No reported chest pain or belly pain. Presented to the ER as a code stroke.  Tmax [id number], systolic blood pressures in 200s, satting greater than 96% on 2 L.  White count 13.3, creatinine 0.9.  Glucose 378, bicarb within normal limits.  Urinalysis indicative of infection.  Troponin 18-22.  CT code stroke closely stable apart from chronic subcortical right MCA territory infarct.  Positive thickening in the right maxillary sinus.  CT of the head and neck with 80% stenosis of the left ICA as well as 70% stenosis of the right ACA.  MRI of the brain with no acute abnormality. Review of Systems: As mentioned in the history of present illness. All other systems reviewed and are negative. Past Medical History:  Diagnosis Date   Anemia    Arthritis    Asthma    age 24   BPH with urinary obstruction    stable on flomax ([name]   Coronary atherosclerosis of unspecified type of vessel, native or graft    Diabetes mellitus     Type II   Emphysema of lung (HCC)    Esophageal stricture    Gastritis    GERD (gastroesophageal reflux disease)    Hiatal hernia    Hx of colonic polyps    Hyperlipidemia    Hypertension    Internal hemorrhoids without mention of complication    Lumbago    Pain in both lower legs    Pneumonia    PSA elevation    now averaging 2's   Tobacco abuse    URI (upper respiratory infection)    Urinary retention    Past Surgical History:  Procedure Laterality Date   2D Echo  06/03   Abdominal ultrasound  06/03   Negative   BACK SURGERY     COLONOSCOPY     CT of head  and sinuses  06/03   Negative   CT of the chest, abdomen, pelvis  06/03   Chest negative; Abdomen, left adrenal lesion; Pelvis, enlarged prostate   ESOPHAGOGASTRODUODENOSCOPY  02/13/07   gastritis and duodenitis without bleed   ESOPHAGOGASTRODUODENOSCOPY N/A 06/26/2014   Procedure: ESOPHAGOGASTRODUODENOSCOPY (EGD);  Surgeon: Milus Banister, MD;  Location: [facility] [facility];  Service: Endoscopy;  Laterality: N/A;   ESOPHAGOGASTRODUODENOSCOPY (EGD) WITH PROPOFOL N/A 04/08/2020   Procedure: ESOPHAGOGASTRODUODENOSCOPY (EGD) WITH PROPOFOL;  Surgeon: Ladene Artist, MD;  Location: [facility];  Service: Endoscopy;  Laterality: N/A;   HOT HEMOSTASIS N/A 04/08/2020   Procedure: HOT HEMOSTASIS (ARGON PLASMA COAGULATION/BICAP);  Surgeon: Ladene Artist, MD;  Location: [facility];  Service: Endoscopy;  Laterality: N/A;   KNEE ARTHROSCOPY  10/00   Right   LEFT HEART CATH AND CORONARY ANGIOGRAPHY N/A 05/23/2021   Procedure: LEFT HEART CATH AND CORONARY ANGIOGRAPHY;  Surgeon: [name], MD;  Location: [place] [facility];  Service: Cardiovascular;  Laterality: N/A;   LOWER EXTREMITY ANGIOGRAPHY Right 12/27/2017   Procedure: LOWER EXTREMITY ANGIOGRAPHY;  Surgeon: Algernon Huxley, MD;  Location: [facility];  Service: Cardiovascular;  Laterality: Right;   LOWER EXTREMITY ANGIOGRAPHY Left 01/10/2018   Procedure: LOWER EXTREMITY  ANGIOGRAPHY;  Surgeon: Algernon Huxley, MD;  Location: [place] [facility];  Service: Cardiovascular;  Laterality: Left;   LOWER EXTREMITY ANGIOGRAPHY Left 05/29/2018   Procedure: LOWER EXTREMITY ANGIOGRAPHY;  Surgeon: Algernon Huxley, MD;  Location: [place] [facility];  Service: Cardiovascular;  Laterality: Left;   LOWER EXTREMITY ANGIOGRAPHY Left 05/30/2018   Procedure: Lower Extremity Angiography;  Surgeon: Algernon Huxley, MD;  Location: [place] [facility];  Service: Cardiovascular;  Laterality: Left;   LP  06/03   Microwave thermotherapy prostate  08/28/07   [place]   Persantine cardiolite  03/01   EF 68%   POLYPECTOMY  11/95   Stress myoview  07/06   EF 67%   UPPER GASTROINTESTINAL ENDOSCOPY     Social History:  reports that he quit smoking about 10 months ago. His smoking use included cigarettes. He started smoking about 58 years ago. He has a 114.00 pack-year smoking history. He has quit using smokeless tobacco.  His smokeless tobacco use included chew. He reports that he does not currently use alcohol. He reports that he does not use drugs.  No Known Allergies  Family History  Problem Relation Age of Onset   Diabetes Father    Alcohol abuse Paternal Uncle    Alcohol abuse Paternal Uncle    Alcohol abuse Paternal Uncle    Rheum arthritis Mother    Heart disease Neg Hx    Stroke Neg Hx    Cancer Neg Hx    Drug abuse Neg Hx    Depression Neg Hx    Colon cancer Neg Hx    Rectal cancer Neg Hx    Stomach cancer Neg Hx    Kidney cancer Neg Hx    Bladder Cancer Neg Hx    Prostate cancer Neg Hx     Prior to Admission medications   Medication Sig Start Date End Date Taking? Authorizing Provider  acetaminophen (TYLENOL) 500 MG tablet Take 1,000 mg by mouth every 6 (six) hours as needed (hip pain.). Rapid release   Yes [provider]  aspirin 81 MG EC tablet Take 81 mg by mouth in the morning.   Yes [provider]  atorvastatin (LIPITOR) 20 MG tablet TAKE 1 TABLET  BY MOUTH IN THE EVENING FOR CHOLESTEROL 01/21/22  Yes Pleas Koch, NP  clopidogrel (PLAVIX) 75 MG tablet TAKE 1 TABLET BY MOUTH EVERY DAY 08/10/21  Yes Kris Hartmann, NP  ferrous gluconate (FERGON) 324 MG tablet TAKE 1 TABLET BY MOUTH EVERY DAY WITH BREAKFAST 07/26/21  Yes Ladene Artist, MD  FIBER PO Take 1 capsule by mouth in the morning.   Yes [provider]  finasteride (PROSCAR) 5 MG tablet TAKE 1 TABLET BY MOUTH EVERY DAY 04/12/21  Yes McGowan, Larene Beach A, PA-C  fluticasone (FLONASE) 50 MCG/ACT nasal spray SPRAY 2 SPRAYS INTO EACH NOSTRIL EVERY DAY 10/28/21  Yes Dugal, Tabitha, FNP  glipiZIDE (GLUCOTROL) 10 MG  tablet Take 10 mg by mouth daily before supper.  07/05/14  Yes [provider]  GLUCOSAMINE HCL PO Take 1 tablet by mouth daily.   Yes [provider]  Insulin Glargine (BASAGLAR KWIKPEN) 100 UNIT/ML Inject 30 Units into the skin daily.   Yes [provider]  lisinopril-hydrochlorothiazide (ZESTORETIC) 20-25 MG tablet Take 1 tablet by mouth daily.   Yes [provider]  metFORMIN (GLUCOPHAGE) 500 MG tablet TAKE 2 TABLETS BY MOUTH TWICE A DAY WITH MEAL FOR DIABETES 12/30/19  Yes Pleas Koch, NP  pantoprazole (PROTONIX) 40 MG tablet Take 1 tablet (40 mg total) by mouth daily. For heartburn 05/20/21  Yes Pleas Koch, NP  sildenafil (VIAGRA) 100 MG tablet Take 1 tablet (100 mg total) by mouth daily as needed for erectile dysfunction. 04/27/21  Yes McGowan, Larene Beach A, PA-C  tamsulosin (FLOMAX) 0.4 MG CAPS capsule TAKE 1 CAPSULE BY MOUTH 2 TIMES DAILY. 04/12/21  Yes McGowan, Larene Beach A, PA-C  vitamin B-12 (CYANOCOBALAMIN) 1000 MCG tablet Take 1,000 mcg by mouth in the morning.   Yes [provider]  Fluticasone-Umeclidin-Vilant (TRELEGY ELLIPTA) 100-62.5-25 MCG/ACT AEPB Inhale 1 puff into the lungs daily. Patient not taking: Reported on 04/05/2022 11/04/21   Tyler Pita, MD    Physical Exam: Vitals:   04/05/22 1115  04/05/22 1145 04/05/22 1145 04/05/22 1148  BP: (!) 178/96 (!) 188/86  (!) 188/86  Pulse: 75 84  88  Resp: 17 20  (!) 21  Temp:    99.8 F (37.7 C)  TempSrc:    Oral  SpO2: 96% 96% 96% 96%  Weight:      Height:       Physical Exam Constitutional:      General: He is not in acute distress.    Appearance: He is normal weight.  HENT:     Head: Normocephalic and atraumatic.     Mouth/Throat:     Mouth: Mucous membranes are moist.  Eyes:     Pupils: Pupils are equal, round, and reactive to light.  Cardiovascular:     Rate and Rhythm: Normal rate and regular rhythm.  Pulmonary:     Effort: Pulmonary effort is normal.     Breath sounds: Normal breath sounds.  Abdominal:     General: Bowel sounds are normal.  Musculoskeletal:        General: Normal range of motion.  Skin:    General: Skin is warm.  Neurological:     Comments: Positive generalized lethargy/fatigue otherwise no focal deficits noted.  Psychiatric:        Mood and Affect: Mood normal.     Data Reviewed:  There are no new results to review at this time.  MR BRAIN WO CONTRAST CLINICAL DATA:  Neuro deficit, acute, stroke suspected. Left-sided weakness and headache.  EXAM: MRI HEAD WITHOUT CONTRAST  TECHNIQUE: Multiplanar, multiecho pulse sequences of the brain and surrounding structures were obtained without intravenous contrast.  COMPARISON:  Head CT and CTA 04/05/2022  FINDINGS: Brain: There is no evidence of an acute infarct, intracranial hemorrhage, mass, midline shift, or extra-axial fluid collection. A moderate-sized chronic right parietal infarct is again noted. No age advanced chronic white matter disease is seen elsewhere. There is mild cerebral atrophy.  Vascular: Major intracranial vascular flow voids are preserved.  Skull and upper cervical spine: Unremarkable bone marrow signal.  Sinuses/Orbits: Unremarkable orbits. Mild mucosal thickening in the right sphenoid sinus. No significant  mastoid fluid.  Other: None.  IMPRESSION: 1.  No acute intracranial abnormality. 2. Chronic right parietal infarct.  Electronically Signed   By: Logan Bores M.D.   On: 04/05/2022 12:41 DG Tibia/Fibula Left CLINICAL DATA:  Neuro deficit, acute, stroke suspected. Encounter for imaging to screen for metal prior to MRI  EXAM: LEFT TIBIA AND FIBULA - 2 VIEW  COMPARISON:  None Available.  FINDINGS: Vascular stent in the partially imaged distal femur. No other foreign bodies identified. No evidence of acute fracture or joint malalignment.  IMPRESSION: 1. No acute finding. 2. Partially imaged stent.  Electronically Signed   By: Margaretha Sheffield M.D.   On: 04/05/2022 10:01 DG FEMUR MIN 2 VIEWS LEFT CLINICAL DATA:  Evaluate for metallic foreign body prior to MRI.  EXAM: LEFT FEMUR 2 VIEWS  COMPARISON:  01/12/2020  FINDINGS: The left hip appears located. No signs acute fracture or dislocation. There are multiple stents identified along the course of the left femoral artery. These have been present since at least 01/12/2020. No additional metallic foreign bodies identified.  IMPRESSION: 1. No acute findings. 2. Multiple stents along the course of the left femoral artery.  Electronically Signed   By: Kerby Moors M.D.   On: 04/05/2022 09:58 CT ANGIO HEAD NECK W WO CM W PERF (CODE STROKE) CLINICAL DATA:  Neuro deficit, acute, stroke suspected  EXAM: CT ANGIOGRAPHY HEAD AND NECK  TECHNIQUE: Multidetector CT imaging of the head and neck was performed using the standard protocol during bolus administration of intravenous contrast. Multiplanar CT image reconstructions and MIPs were obtained to evaluate the vascular anatomy. Carotid stenosis measurements (when applicable) are obtained utilizing NASCET criteria, using the distal internal carotid diameter as the denominator.  RADIATION DOSE REDUCTION: This exam was performed according to the departmental  dose-optimization program which includes automated exposure control, adjustment of the mA and/or kV according to patient size and/or use of iterative reconstruction technique.  CONTRAST:  116m OMNIPAQUE IOHEXOL 350 MG/ML SOLN  COMPARISON:  Same day CT head.  CT chest November 07, 2021.  FINDINGS: CTA NECK FINDINGS  Aortic arch: Great vessel origins are patent without  Right carotid system: Approximately 70% stenosis of the right ICA origin due to atherosclerosis.  Left carotid system: Approximately 80% stenosis of the ICA origin due to atherosclerosis.  Vertebral arteries: Left dominant. Moderate multifocal stenosis of the left vertebral artery. The right vertebral artery is small throughout its course.  Skeleton: No acute findings on limited assessment. Multilevel degenerative change.  Other neck: No acute findings on limited assessment.  Upper chest: Left hilar soft tissue with adjacent probable post treatment change in the left upper lobe. Esophageal wall thickening. Also, significant including left paratracheal 3.2 by 1.6 cm lymph node.  Review of the MIP images confirms the above findings  CTA HEAD FINDINGS  Anterior circulation: Moderate atherosclerosis and narrowing of the intracranial ICAs bilaterally. Bilateral MCAs and ACAs are patent without proximal high-grade stenosis. Hypoplastic right A1 ACA. Attenuated vessels an area of calcification in the prior right parietal infarct.  Posterior circulation: Small/non dominant right vertebral artery is poorly opacified intradurally. Left vertebral artery and basilar artery are patent. Both posterior cerebral arteries are patent without proximal high-grade stenosis.  Venous sinuses: Limited assessment due to arterial timing. No obvious evidence of dural venous sinus thrombosis.  Review of the MIP images confirms the above findings  IMPRESSION: 1. No emergent large vessel occlusion. 2. 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 3. Severe (approximately 70%) stenosis of the right ICA  origin the neck. 4. Moderate atherosclerotic narrowing of the intracranial ICAs bilaterally. 5. Small/non dominant right vertebral artery is poorly opacified intradurally. 6. Left hilar soft tissue with adjacent probable post treatment change in the left upper lobe. Also, significant interval increase in left paratracheal 3.2 by 1.6 cm lymph node. A follow-up CT of the chest (preferably with contrast) is recommended to better assess for disease progression. 7. Esophageal wall thickening.  Preliminary findings discussed with Dr. Leonel Ramsay via telephone at 8:45 a.m.  Electronically Signed   By: Margaretha Sheffield M.D.   On: 04/05/2022 08:57 CT HEAD CODE STROKE WO CONTRAST CLINICAL DATA:  Code stroke. Neuro deficit, acute, stroke suspected.  EXAM: CT HEAD WITHOUT CONTRAST  TECHNIQUE: Contiguous axial images were obtained from the base of the skull through the vertex without intravenous contrast.  RADIATION DOSE REDUCTION: This exam was performed according to the departmental dose-optimization program which includes automated exposure control, adjustment of the mA and/or kV according to patient size and/or use of iterative reconstruction technique.  COMPARISON:  Report from head CT 07/29/2001 (images unavailable).  FINDINGS: Brain:  Mild-to-moderate generalized cerebral atrophy.  Chronic cortical/subcortical right MCA territory infarct within the right parietal lobe and right temporoparietal junction.  There is no acute intracranial hemorrhage.  No acute demarcated cortical infarct.  No extra-axial fluid collection.  No evidence of an intracranial mass.  No midline shift.  Vascular: Atherosclerotic calcifications.  Skull: No fracture or aggressive osseous lesion.  Sinuses/Orbits: No mass or acute finding within the imaged orbits. Mild mucosal thickening within  the right sphenoid sinus.  ASPECTS [facility] Stroke Program Early CT Score)  - Ganglionic level infarction (caudate, lentiform nuclei, internal capsule, insula, M1-M3 cortex): 7  - Supraganglionic infarction (M4-M6 cortex): 3  Total score (0-10 with 10 being normal): 10 (when discounting chronic infarcts).  No evidence of an acute intracranial abnormality. These results were called by telephone at the time of interpretation on 04/05/2022 at 8:27 am to provider Duffy Bruce , who verbally acknowledged these results.  IMPRESSION: 1. No evidence of an acute intracranial abnormality. 2. Chronic cortical/subcortical right MCA territory infarct within the right parietal lobe and right temporoparietal junction. 3. Mild-to-moderate generalized cerebral atrophy. 4. Mild mucosal thickening within the right maxillary sinus  Electronically Signed   By: Kellie Simmering D.O.   On: 04/05/2022 08:30   Lab Results  Component Value Date   WBC 13.3 (H) 04/05/2022   HGB 13.0 04/05/2022   HCT 40.1 04/05/2022   MCV 87.9 04/05/2022   PLT 309 [id number]   Last metabolic panel Lab Results  Component Value Date   GLUCOSE 378 (H) 04/05/2022   NA 132 (L) 04/05/2022   K 4.4 04/05/2022   CL 98 04/05/2022   CO2 23 04/05/2022   BUN 19 04/05/2022   CREATININE 0.90 04/05/2022   GFRNONAA >60 04/05/2022   CALCIUM 8.3 (L) 04/05/2022   PROT 6.3 (L) 04/05/2022   ALBUMIN 3.4 (L) 04/05/2022   BILITOT 0.6 04/05/2022   ALKPHOS 58 04/05/2022   AST 19 04/05/2022   ALT 13 04/05/2022   ANIONGAP 11 04/05/2022    Assessment and Plan: * Encephalopathy Initial presentation with weakness, generalized lethargy as well as left-sided hemiparesis with some concern for CVA. Migraine also differential diagnosis Noted prior history of posterior CVA Code stroke initiated with workup including a negative MRI Noted concomitant hyperglycemia and urinary tract infection. Suspect toxic metabolic component to overall  presentation  Neurology recommendations appreciated Will plan for treatment for UTI with  Rocephin as well as blood sugar control Check ammonia level Reassess as clinically necessary  Continue to monitor for now   History of CVA (cerebrovascular accident) Noted large old right parietal stroke without acute findings code stroke evaluation.  Will continue home aspirin Follow-up and formal neurology recommendations   UTI (urinary tract infection) Urinalysis indicative of infection with noted new onset encephalopathy and confusion as well as leukocytosis with white count of 13 IV Rocephin Urine culture Follow  GERD (gastroesophageal reflux disease) PPI  Type 2 diabetes mellitus with hyperglycemia (HCC) Blood sugar in 300s on presentation without ketoacidosis Will start Scale insulin Titrate regimen A1c Follow  Essential hypertension Systolic BP in [id number] on presentation  MRI negative for acute CVA  Titrate home regimen.  HLD (hyperlipidemia) Cont statin       Advance Care Planning:   Code Status: Full Code   Consults: Neurology   Family Communication: No family at the bedside   Severity of Illness: The appropriate patient status for this patient is OBSERVATION. Observation status is judged to be reasonable and necessary in order to provide the required intensity of service to ensure the patient's safety. The patient's presenting symptoms, physical exam findings, and initial radiographic and laboratory data in the context of their medical condition is felt to place them at decreased risk for further clinical deterioration. Furthermore, it is anticipated that the patient will be medically stable for discharge from the hospital within 2 midnights of admission.   Author: Deneise Lever, MD 04/05/2022 1:39 PM  For on call review [email or web address].

## 2022-04-05 NOTE — Assessment & Plan Note (Signed)
Cont statin

## 2022-04-05 NOTE — Inpatient Diabetes Management (Signed)
Inpatient Diabetes Program Recommendations  AACE/ADA: New Consensus Statement on Inpatient Glycemic Control (2015)  Target Ranges:  Prepandial:   less than 140 mg/dL      Peak postprandial:   less than 180 mg/dL (1-2 hours)      Critically ill patients:  140 - 180 mg/dL   Lab Results  Component Value Date   GLUCAP 398 (H) 04/05/2022   HGBA1C 7.4 (A) 05/12/2021    Review of Glycemic Control  Latest Reference Range & Units 04/05/22 08:25  Glucose-Capillary 70 - 99 mg/dL 398 (H)  (H): Data is abnormally high  Latest Reference Range & Units 04/05/22 08:39  CO2 22 - 32 mmol/L 23  Anion gap 5 - 15  11   Diabetes history: DM2 Outpatient Diabetes medications: Basaglar 30 units QHS, Glipizide 10 mg with dinner, Metformin 1000 mg BID Current orders for Inpatient glycemic control: None  Inpatient Diabetes Program Recommendations:    If appropriate, please consider:  Glycemic control order set using Novolog 0-15 units TID and 0-5 units QHS Semglee 15 units QD  Will continue to follow while inpatient.  Thank you, Reche Dixon, MSN, [place] Diabetes Coordinator Inpatient Diabetes Program [phone] (team pager from 8a-5p)

## 2022-04-05 NOTE — Progress Notes (Signed)
0738 - patient arrived to ED [phone] - stroke alert activated, patient in CT at time of activation.  0820 - paged Dr. Leonel Ramsay.  0836 - patient returned from CT with Dr. Leonel Ramsay at bedside to assess patient.  [id number] - MRS 2 0839 - no tnk, TSRN off of camera.

## 2022-04-05 NOTE — ED Notes (Signed)
Pt on phone with MRI

## 2022-04-05 NOTE — ED Notes (Signed)
CBG was 374 MD aware gave patient 15 units. MD wants recheck in an hour (1522). Patient swallowed all other oral meds with water well.

## 2022-04-05 NOTE — ED Notes (Signed)
Back in room with neurologist at bedside. Stroke coordinator at bedside. Pt is answering questions appropriately but has L neglect and hemianopia. Pt complains of severe HA and feeling off balance since around 12am today.

## 2022-04-05 NOTE — ED Provider Notes (Signed)
[facility] Provider Note    Event Date/Time   First MD Initiated Contact with Patient 04/05/22 [phone]     (approximate)   History   Abdominal Pain and Chest Pain   HPI  Antonio [name] is a 77 y.o. male here with headache, nausea, vomiting.  Initially complained of nausea, vomiting, mild abdominal pain.  However on my assessment, patient complains more of headache which began around midnight to 1 AM last night.  He also has had loss of balance and left-sided weakness.  This is all new.  Last known normal was going to bed last night.  Woke up with a headache.  No history of stroke per his review.  No chest pain.  Patient is somewhat confused, oriented to person and place but not time.  History provided primarily by wife.     Physical Exam   Triage Vital Signs: ED Triage Vitals  Enc Vitals Group     BP 04/05/22 0745 (!) 184/83     Pulse Rate 04/05/22 0745 80     Resp 04/05/22 0745 16     Temp 04/05/22 0745 98.5 F (36.9 C)     Temp Source 04/05/22 0745 Oral     SpO2 04/05/22 0745 94 %     Weight 04/05/22 0744 177 lb 14.6 oz (80.7 kg)     Height 04/05/22 0744 '5\' 6"'$  (1.676 m)     Head Circumference --      Peak Flow --      Pain Score 04/05/22 0744 10     Pain Loc --      Pain Edu? --      Excl. in [place]? --     Most recent vital signs: Vitals:   04/05/22 1145 04/05/22 1148  BP:  (!) 188/86  Pulse:  88  Resp:  (!) 21  Temp:  99.8 F (37.7 C)  SpO2: 96% 96%     General: Awake, no distress.  CV:  Good peripheral perfusion.  Regular rate and rhythm. Resp:  Normal work of breathing.  Lungs clear to auscultation bilaterally. Abd:  No distention.  Other:  Left-sided neglect with left upper and lower extremity weakness and drift.  Left-sided visual field deficit.  Oriented to person and place but not time.   ED Results / Procedures / Treatments   Labs (all labs ordered are listed, but only abnormal results are displayed) Labs Reviewed  CBC  WITH DIFFERENTIAL/PLATELET - Abnormal; Notable for the following components:      Result Value   WBC 13.3 (*)    Neutro Abs 12.6 (*)    Lymphs Abs 0.4 (*)    Abs Immature Granulocytes 0.09 (*)    All other components within normal limits  COMPREHENSIVE METABOLIC PANEL - Abnormal; Notable for the following components:   Sodium 132 (*)    Glucose, Bld 378 (*)    Calcium 8.3 (*)    Total Protein 6.3 (*)    Albumin 3.4 (*)    All other components within normal limits  URINALYSIS, ROUTINE W REFLEX MICROSCOPIC - Abnormal; Notable for the following components:   Color, Urine STRAW (*)    APPearance HAZY (*)    Specific Gravity, Urine 1.039 (*)    Glucose, UA >=500 (*)    Hgb urine dipstick SMALL (*)    Ketones, ur 20 (*)    Protein, ur >=300 (*)    Leukocytes,Ua LARGE (*)    Bacteria, UA RARE (*)  All other components within normal limits  CBG MONITORING, ED - Abnormal; Notable for the following components:   Glucose-Capillary 398 (*)    All other components within normal limits  TROPONIN I (HIGH SENSITIVITY) - Abnormal; Notable for the following components:   Troponin I (High Sensitivity) 18 (*)    All other components within normal limits  TROPONIN I (HIGH SENSITIVITY) - Abnormal; Notable for the following components:   Troponin I (High Sensitivity) 22 (*)    All other components within normal limits  URINE CULTURE  PROTIME-INR  APTT  URINE DRUG SCREEN, QUALITATIVE ([facility] ONLY)  ETHANOL     EKG    RADIOLOGY CT head: 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   PROCEDURES:  Critical Care performed: Yes, see critical care procedure note(s)  .Critical Care  Performed by: Duffy Bruce, MD Authorized by: Duffy Bruce, MD   Critical care provider statement:    Critical care time (minutes):  30   Critical care time was exclusive of:  Separately billable procedures and treating  other patients   Critical care was necessary to treat or prevent imminent or life-threatening deterioration of the following conditions:  Cardiac failure, circulatory failure and CNS failure or compromise   Critical care was time spent personally by me on the following activities:  Development of treatment plan with patient or surrogate, discussions with consultants, evaluation of patient's response to treatment, examination of patient, ordering and review of laboratory studies, ordering and review of radiographic studies, ordering and performing treatments and interventions, pulse oximetry, re-evaluation of patient's condition and review of old [place] ED: Medications  labetalol (NORMODYNE) injection 5 mg (has no administration in time range)  cefTRIAXone (ROCEPHIN) 2 g in sodium chloride 0.9 % 100 mL IVPB (has no administration in time range)  ondansetron (ZOFRAN) injection 4 mg (4 mg Intravenous Given 04/05/22 0811)  iohexol (OMNIPAQUE) 350 MG/ML injection 100 mL (100 mLs Intravenous Contrast Given 04/05/22 0837)  metoCLOPramide (REGLAN) injection 10 mg (10 mg Intravenous Given 04/05/22 0906)     IMPRESSION / MDM / [place] / ED COURSE  I reviewed the triage vital signs and the nursing notes.                              Differential diagnosis includes, but is not limited to, ischemic stroke, hemorrhagic stroke, mass/lesion with edema, metabolic encephalopathy, HTN emergency, hyperglycemia/HHS  Patient's presentation is most consistent with acute presentation with potential threat to life or bodily function.  The patient is on the cardiac monitor to evaluate for evidence of arrhythmia and/or significant heart rate changes  77 year old male here with acute onset altered mental status, left-sided weakness.  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  Family denies known history of this.  CT  scan, however, shows likely old infarct.  No evidence of acute LVO.  Concern for possible acute on chronic CVA in this area versus reactivation in the setting of other underlying illness.  Of note, patient is significantly hypertensive here.  Will give labetalol as needed greater than 220/120.  Labs show mild leukocytosis.  He is hyperglycemic.  Also may have a UTI.  This could certainly trigger some of his symptoms.  Will be starting IV Rocephin and plan to admit  to medicine due to persistent altered mental status.  MRI can be followed up as an inpatient.  Neurology is following.     FINAL CLINICAL IMPRESSION(S) / ED DIAGNOSES   Final diagnoses:  Stroke-like symptoms  Lower urinary tract infectious disease  Encephalopathy     Rx / DC Orders   ED Discharge Orders     None        Note:  This document was prepared using Dragon voice recognition software and may include unintentional dictation errors.   Duffy Bruce, MD 04/05/22 [phone]

## 2022-04-05 NOTE — Assessment & Plan Note (Addendum)
Systolic BP in [id number] on presentation  MRI negative for acute CVA  Titrate home regimen.

## 2022-04-05 NOTE — Consult Note (Signed)
CODE STROKE- PHARMACY COMMUNICATION   Time CODE STROKE called/page received: 0818  Time response to CODE STROKE was made (in person or via phone): immediately, in person  Time Stroke Kit retrieved from [place] (only if needed): N/A  Name of Provider/Nurse contacted: N/A  Past Medical History:  Diagnosis Date   Anemia    Arthritis    Asthma    age 77   BPH with urinary obstruction    stable on flomax ([name]   Coronary atherosclerosis of unspecified type of vessel, native or graft    Diabetes mellitus    Type II   Emphysema of lung ([place])    Esophageal stricture    Gastritis    GERD (gastroesophageal reflux disease)    Hiatal hernia    Hx of colonic polyps    Hyperlipidemia    Hypertension    Internal hemorrhoids without mention of complication    Lumbago    Pain in both lower legs    Pneumonia    PSA elevation    now averaging 2's   Tobacco abuse    URI (upper respiratory infection)    Urinary retention    Prior to Admission medications   Medication Sig Start Date End Date Taking? Authorizing Provider  acetaminophen (TYLENOL) 500 MG tablet Take 1,000 mg by mouth every 6 (six) hours as needed (hip pain.). Rapid release    [provider]  aspirin 81 MG EC tablet Take 81 mg by mouth in the morning.    [provider]  atorvastatin (LIPITOR) 20 MG tablet TAKE 1 TABLET BY MOUTH IN THE EVENING FOR CHOLESTEROL 01/21/22   Pleas Koch, NP  clopidogrel (PLAVIX) 75 MG tablet TAKE 1 TABLET BY MOUTH EVERY DAY 08/10/21   Kris Hartmann, NP  ferrous gluconate (FERGON) 324 MG tablet TAKE 1 TABLET BY MOUTH EVERY DAY WITH BREAKFAST 07/26/21   Ladene Artist, MD  FIBER PO Take 1 capsule by mouth in the morning.    [provider]  finasteride (PROSCAR) 5 MG tablet TAKE 1 TABLET BY MOUTH EVERY DAY 04/12/21   McGowan, Larene Beach A, PA-C  fluticasone (FLONASE) 50 MCG/ACT nasal spray SPRAY 2 SPRAYS INTO EACH NOSTRIL EVERY DAY 10/28/21   Eugenia Pancoast, FNP   Fluticasone-Umeclidin-Vilant (TRELEGY ELLIPTA) 100-62.5-25 MCG/ACT AEPB Inhale 1 puff into the lungs daily. 11/04/21   Tyler Pita, MD  glipiZIDE (GLUCOTROL) 10 MG tablet Take 10 mg by mouth daily before supper.  07/05/14   [provider]  GLUCOSAMINE HCL PO Take 1 tablet by mouth daily.    [provider]  Insulin Glargine (BASAGLAR KWIKPEN) 100 UNIT/ML Inject 30 Units into the skin daily.    [provider]  lisinopril-hydrochlorothiazide (ZESTORETIC) 20-25 MG tablet Take 1 tablet by mouth daily.    [provider]  metFORMIN (GLUCOPHAGE) 500 MG tablet TAKE 2 TABLETS BY MOUTH TWICE A DAY WITH MEAL FOR DIABETES 12/30/19   Pleas Koch, NP  pantoprazole (PROTONIX) 40 MG tablet Take 1 tablet (40 mg total) by mouth daily. For heartburn 05/20/21   Pleas Koch, NP  sildenafil (VIAGRA) 100 MG tablet Take 1 tablet (100 mg total) by mouth daily as needed for erectile dysfunction. 04/27/21   Zara Council A, PA-C  tamsulosin (FLOMAX) 0.4 MG CAPS capsule TAKE 1 CAPSULE BY MOUTH 2 TIMES DAILY. 04/12/21   Zara Council A, PA-C  vitamin B-12 (CYANOCOBALAMIN) 1000 MCG tablet Take 1,000 mcg by mouth in the morning.    [provider]    Delena Bali ,PharmD Clinical Pharmacist  04/05/2022  8:50 AM

## 2022-04-05 NOTE — ED Notes (Signed)
Satting 88-89% on RA. Placed on 2L oxygen.

## 2022-04-05 NOTE — Consult Note (Signed)
Neurology Consultation Reason for Consult: Left sided neglect Referring Physician: Ellender Hose, C  CC: Left neglect  History is obtained from: Patient  HPI: Antonio [name] is a 77 y.o. male with a history of hypertension, hyperlipidemia, diabetes who does not know that he has had a previous stroke who presents with left-sided weakness and headache.  He states that he used to get headaches similar to this years ago, but has not had one in quite some time.  Currently it is bitemporal in location, throbbing in character associated with photophobia.  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   LKW: 9:30 PM, 2/27 tnk given?: no, outside of window   Past Medical History:  Diagnosis Date   Anemia    Arthritis    Asthma    age 19   BPH with urinary obstruction    stable on flomax ([name]   Coronary atherosclerosis of unspecified type of vessel, native or graft    Diabetes mellitus    Type II   Emphysema of lung ([facility])    Esophageal stricture    Gastritis    GERD (gastroesophageal reflux disease)    Hiatal hernia    Hx of colonic polyps    Hyperlipidemia    Hypertension    Internal hemorrhoids without mention of complication    Lumbago    Pain in both lower legs    Pneumonia    PSA elevation    now averaging 2's   Tobacco abuse    URI (upper respiratory infection)    Urinary retention      Family History  Problem Relation Age of Onset   Diabetes Father    Alcohol abuse Paternal Uncle    Alcohol abuse Paternal Uncle    Alcohol abuse Paternal Uncle    Rheum arthritis Mother    Heart disease Neg Hx    Stroke Neg Hx    Cancer Neg Hx    Drug abuse Neg Hx    Depression Neg Hx    Colon cancer Neg Hx    Rectal cancer Neg Hx    Stomach cancer Neg Hx    Kidney cancer Neg Hx    Bladder Cancer Neg Hx    Prostate cancer Neg Hx       Social History:  reports that he quit smoking about 10 months ago. His smoking use included cigarettes. He started smoking about 58 years ago. He has a 114.00 pack-year smoking history. He has quit using smokeless tobacco.  His smokeless tobacco use included chew. He reports that he does not currently use alcohol. He reports that he does not use drugs.   Exam: Current vital signs: BP (!) 184/83 (BP Location: Left Arm)   Pulse 75   Temp 98.5 F (36.9 C) (Oral)   Resp 16   Ht '5\' 6"'$  (1.676 m)   Wt 80.7 kg   SpO2 92%   BMI 28.72 kg/m  Vital signs in last 24 hours: Temp:  [98.5 F (36.9 C)] 98.5 F (36.9 C) (02/28 0745) Pulse Rate:  [75-80] 75 (02/28 0755) Resp:  [16] 16 (02/28 0745) BP: (184)/(83) 184/83 (02/28 0745) SpO2:  [92 %-94 %] 92 % (02/28 0755) Weight:  [80.7 kg] 80.7 kg (02/28 0744)   Physical Exam  Appears well-developed and well-nourished.   Neuro: Mental Status: Patient is awake, alert, oriented  to person, place, month, year, and situation. Patient is able to give a clear and coherent history. No signs of aphasia  Dense left neglect Cranial Nerves: II: Left hemianopia Pupils are equal, round, and reactive to light.   III,IV, VI: EOMI without ptosis or diploplia.  V: Facial sensation is diminished on left VII: Facial movement with left facial weakness Motor: 4/5 in the left arm and leg.  Sensory: Sensation is diminished on the left, withdraws left arm to pain, but unable to say where he was pinched. Cerebellar: Does not perform    I have reviewed labs in epic and the results pertinent to this consultation are: Cr 0.9 Na 132   I have reviewed the images obtained:CT head - old R MCA stroke  Impression: 77 year old male with left-sided neglect and weakness.  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  Given the symptoms are consistent with the same area, another possibility that I have seen his  migraine can cause recrudescence of previous stroke symptoms, and therefore I do think an MRI would be prudent.  He will need vascular surgery referral for carotid stenosis either as inpatient or outpatient depending on whether he has new stroke.  Recommendations: 1) MRI brain 2) if acute stroke is seen: - HgbA1c, fasting lipid panel - Frequent neuro checks - Echocardiogram - Prophylactic therapy-Antiplatelet med: Aspirin - dose 325 mg daily - Risk factor modification - Telemetry monitoring - PT consult, OT consult, Speech consult - Stroke team to follow 3) if negative and symptoms resolved with migraine cocktail, could consider outpatient follow-up.    Roland Rack, MD [facility] [phone]  If 7pm- 7am, please page neurology on call as listed in [place].

## 2022-04-05 NOTE — Assessment & Plan Note (Signed)
Urinalysis indicative of infection with noted new onset encephalopathy and confusion as well as leukocytosis with white count of 13 IV Rocephin Urine culture Follow

## 2022-04-05 NOTE — Code Documentation (Signed)
Stroke Response Nurse Documentation Code Documentation  Antonio [name] is a 77 y.o. male arriving to [facility] via [facility] on 04/05/2022 with past medical hx of HTN, HLD, DM, previous tobacco use, coronary atherosclerosis, GERD, and sleep apnea. On No antithrombotic. Code stroke was activated by ED.   Patient from home where he was LKW at 04/04/2022 at 2130 by his significant other and now complaining of left-sided weakness, nausea, vomiting. Patient was last seen by significant other at 2130 the night prior. Around 0000-0100, patient woke up with a headache, left-sided weakness, and felt off balance.   Stroke team meets patient in CT. Patient cleared for CT by Dr. Myrene Buddy. Patient to CT with team. NIHSS 12, see documentation for details and code stroke times. Patient with decreased LOC, disoriented, left hemianopia, left facial droop, left arm weakness, left leg weakness, left decreased sensation, dysarthria , and left neglect on exam. The following imaging was completed:  CT Head, CTA, and CTP. Patient is not a candidate for IV Thrombolytic due to outside window, per MD. Patient is not a candidate for IR due to no LVO seen on imaging, per MD.   Care Plan: q2 h NIHSS and vital signs. Stroke swallow screen per protocol, Verbal order from MD Kirkpatrick BP parameters <220/110  Bedside handoff with ED RN Elie Goody.    Charise Carwin  Stroke Response RN

## 2022-04-05 NOTE — Assessment & Plan Note (Signed)
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  Will continue home aspirin Follow-up and formal neurology recommendations

## 2022-04-05 NOTE — ED Notes (Signed)
In CT with pt. Arrived to CT at [phone].  Stroke coordinator arrived to CT.

## 2022-04-05 NOTE — ED Notes (Addendum)
[place] RN/Tracie RN on neuro cart provided with info. Pt in CT at this time. Wife at bedside and provided information. Pt is on thinners.

## 2022-04-05 NOTE — ED Notes (Signed)
Activated Code Stroke w/[facility]@ 8:14am

## 2022-04-05 NOTE — ED Notes (Signed)
Neurologist Leonel Ramsay gave verbal order for BP parameters for this pt. BP should remain <220/110.

## 2022-04-05 NOTE — Assessment & Plan Note (Signed)
Blood sugar in 300s on presentation without ketoacidosis Will start Scale insulin Titrate regimen A1c Follow

## 2022-04-05 NOTE — Assessment & Plan Note (Addendum)
Initial presentation with weakness, generalized lethargy as well as left-sided hemiparesis with some concern for CVA. Migraine also differential diagnosis Noted prior history of posterior CVA Code stroke initiated with workup including a negative MRI Noted concomitant hyperglycemia and urinary tract infection. Suspect toxic metabolic component to overall presentation  Neurology recommendations appreciated Will plan for treatment for UTI with Rocephin as well as blood sugar control Check ammonia level Reassess as clinically necessary  Continue to monitor for now

## 2022-04-05 NOTE — ED Notes (Signed)
Pt sitting up in bed with emesis bag. Wife at bedside. Respirations unlabored.

## 2022-04-06 ENCOUNTER — Observation Stay (HOSPITAL_COMMUNITY)
Admit: 2022-04-06 | Discharge: 2022-04-06 | Disposition: A | Discharge status: Home / Self Care | Payer: Medicare Other | Attending: Student | Admitting: Student

## 2022-04-06 ENCOUNTER — Encounter: Payer: Self-pay | Admitting: Family Medicine

## 2022-04-06 DIAGNOSIS — Z87891 Personal history of nicotine dependence: Secondary | ICD-10-CM

## 2022-04-06 DIAGNOSIS — N401 Enlarged prostate with lower urinary tract symptoms: Secondary | ICD-10-CM | POA: Diagnosis present

## 2022-04-06 DIAGNOSIS — I10 Essential (primary) hypertension: Secondary | ICD-10-CM | POA: Diagnosis present

## 2022-04-06 DIAGNOSIS — I6389 Other cerebral infarction: Secondary | ICD-10-CM

## 2022-04-06 DIAGNOSIS — N138 Other obstructive and reflux uropathy: Secondary | ICD-10-CM | POA: Diagnosis present

## 2022-04-06 DIAGNOSIS — Z833 Family history of diabetes mellitus: Secondary | ICD-10-CM | POA: Diagnosis not present

## 2022-04-06 DIAGNOSIS — I6523 Occlusion and stenosis of bilateral carotid arteries: Secondary | ICD-10-CM | POA: Diagnosis present

## 2022-04-06 DIAGNOSIS — R2990 Unspecified symptoms and signs involving the nervous system: Principal | ICD-10-CM

## 2022-04-06 DIAGNOSIS — Z923 Personal history of irradiation: Secondary | ICD-10-CM | POA: Diagnosis not present

## 2022-04-06 DIAGNOSIS — I442 Atrioventricular block, complete: Secondary | ICD-10-CM | POA: Diagnosis present

## 2022-04-06 DIAGNOSIS — N179 Acute kidney failure, unspecified: Secondary | ICD-10-CM | POA: Diagnosis present

## 2022-04-06 DIAGNOSIS — Z85118 Personal history of other malignant neoplasm of bronchus and lung: Secondary | ICD-10-CM | POA: Diagnosis not present

## 2022-04-06 DIAGNOSIS — R29810 Facial weakness: Secondary | ICD-10-CM | POA: Diagnosis present

## 2022-04-06 DIAGNOSIS — N390 Urinary tract infection, site not specified: Secondary | ICD-10-CM | POA: Diagnosis present

## 2022-04-06 DIAGNOSIS — Z794 Long term (current) use of insulin: Secondary | ICD-10-CM | POA: Diagnosis not present

## 2022-04-06 DIAGNOSIS — G9340 Encephalopathy, unspecified: Secondary | ICD-10-CM | POA: Diagnosis not present

## 2022-04-06 DIAGNOSIS — B9620 Unspecified Escherichia coli [E. coli] as the cause of diseases classified elsewhere: Secondary | ICD-10-CM | POA: Diagnosis present

## 2022-04-06 DIAGNOSIS — K219 Gastro-esophageal reflux disease without esophagitis: Secondary | ICD-10-CM | POA: Diagnosis present

## 2022-04-06 DIAGNOSIS — G8194 Hemiplegia, unspecified affecting left nondominant side: Secondary | ICD-10-CM | POA: Diagnosis present

## 2022-04-06 DIAGNOSIS — I639 Cerebral infarction, unspecified: Secondary | ICD-10-CM | POA: Diagnosis not present

## 2022-04-06 DIAGNOSIS — J439 Emphysema, unspecified: Secondary | ICD-10-CM | POA: Diagnosis present

## 2022-04-06 DIAGNOSIS — Z8261 Family history of arthritis: Secondary | ICD-10-CM | POA: Diagnosis not present

## 2022-04-06 DIAGNOSIS — E785 Hyperlipidemia, unspecified: Secondary | ICD-10-CM | POA: Diagnosis present

## 2022-04-06 DIAGNOSIS — I2510 Atherosclerotic heart disease of native coronary artery without angina pectoris: Secondary | ICD-10-CM | POA: Diagnosis present

## 2022-04-06 DIAGNOSIS — G9341 Metabolic encephalopathy: Secondary | ICD-10-CM | POA: Diagnosis present

## 2022-04-06 DIAGNOSIS — I6521 Occlusion and stenosis of right carotid artery: Secondary | ICD-10-CM | POA: Diagnosis not present

## 2022-04-06 DIAGNOSIS — Z7982 Long term (current) use of aspirin: Secondary | ICD-10-CM | POA: Diagnosis not present

## 2022-04-06 DIAGNOSIS — E1165 Type 2 diabetes mellitus with hyperglycemia: Secondary | ICD-10-CM | POA: Diagnosis present

## 2022-04-06 DIAGNOSIS — M1990 Unspecified osteoarthritis, unspecified site: Secondary | ICD-10-CM | POA: Diagnosis present

## 2022-04-06 DIAGNOSIS — Z8673 Personal history of transient ischemic attack (TIA), and cerebral infarction without residual deficits: Secondary | ICD-10-CM | POA: Diagnosis not present

## 2022-04-06 LAB — LIPID PANEL
Cholesterol: 138 mg/dL (ref 0–200)
HDL: 41 mg/dL (ref >40)
LDL Cholesterol: 48 mg/dL (ref 0–99)
Total CHOL/HDL Ratio: 3.4 RATIO
Triglycerides: 243 mg/dL — ABNORMAL HIGH (ref <150)
VLDL: 49 mg/dL — ABNORMAL HIGH (ref 0–40)

## 2022-04-06 LAB — HEMOGLOBIN A1C
Hgb A1c MFr Bld: 8.8 % — ABNORMAL HIGH (ref 4.8–5.6)
Mean Plasma Glucose: 206 mg/dL

## 2022-04-06 LAB — PROTIME-INR
INR: 1 (ref 0.8–1.2)
Prothrombin Time: 13.5 seconds (ref 11.4–15.2)

## 2022-04-06 LAB — COMPREHENSIVE METABOLIC PANEL
ALT: 11 U/L (ref 0–44)
AST: 12 U/L — ABNORMAL LOW (ref 15–41)
Albumin: 3.1 g/dL — ABNORMAL LOW (ref 3.5–5.0)
Alkaline Phosphatase: 47 U/L (ref 38–126)
Anion gap: 9 (ref 5–15)
BUN: 31 mg/dL — ABNORMAL HIGH (ref 8–23)
CO2: 26 mmol/L (ref 22–32)
Calcium: 8.3 mg/dL — ABNORMAL LOW (ref 8.9–10.3)
Chloride: 104 mmol/L (ref 98–111)
Creatinine, Ser: 1.46 mg/dL — ABNORMAL HIGH (ref 0.61–1.24)
GFR, Estimated: 49 mL/min — ABNORMAL LOW (ref >60)
Glucose, Bld: 143 mg/dL — ABNORMAL HIGH (ref 70–99)
Potassium: 3.5 mmol/L (ref 3.5–5.1)
Sodium: 139 mmol/L (ref 135–145)
Total Bilirubin: 0.5 mg/dL (ref 0.3–1.2)
Total Protein: 6 g/dL — ABNORMAL LOW (ref 6.5–8.1)

## 2022-04-06 LAB — GLUCOSE, CAPILLARY
Glucose-Capillary: 157 mg/dL — ABNORMAL HIGH (ref 70–99)
Glucose-Capillary: 171 mg/dL — ABNORMAL HIGH (ref 70–99)
Glucose-Capillary: 200 mg/dL — ABNORMAL HIGH (ref 70–99)
Glucose-Capillary: 267 mg/dL — ABNORMAL HIGH (ref 70–99)
Glucose-Capillary: 273 mg/dL — ABNORMAL HIGH (ref 70–99)
Glucose-Capillary: 280 mg/dL — ABNORMAL HIGH (ref 70–99)

## 2022-04-06 LAB — ECHOCARDIOGRAM COMPLETE
AR max vel: 2.42 cm2
AV Area VTI: 3.14 cm2
AV Area mean vel: 3 cm2
AV Mean grad: 2 mmHg
AV Peak grad: 3.5 mmHg
Ao pk vel: 0.93 m/s
Area-P 1/2: 3.21 cm2
Height: 66 in
S' Lateral: 2.6 cm
Weight: 2846.58 oz

## 2022-04-06 LAB — CBC
HCT: 38.5 % — ABNORMAL LOW (ref 39.0–52.0)
Hemoglobin: 12.8 g/dL — ABNORMAL LOW (ref 13.0–17.0)
MCH: 29.1 pg (ref 26.0–34.0)
MCHC: 33.2 g/dL (ref 30.0–36.0)
MCV: 87.5 fL (ref 80.0–100.0)
Platelets: 314 10*3/uL (ref 150–400)
RBC: 4.4 MIL/uL (ref 4.22–5.81)
RDW: 13.5 % (ref 11.5–15.5)
WBC: 11.7 10*3/uL — ABNORMAL HIGH (ref 4.0–10.5)
nRBC: 0 % (ref 0.0–0.2)

## 2022-04-06 LAB — APTT: aPTT: 30 seconds (ref 24–36)

## 2022-04-06 LAB — HEPARIN LEVEL (UNFRACTIONATED): Heparin Unfractionated: 0.17 IU/mL — ABNORMAL LOW (ref 0.30–0.70)

## 2022-04-06 LAB — AMMONIA: Ammonia: 22 umol/L (ref 9–35)

## 2022-04-06 MED ORDER — TAMSULOSIN HCL 0.4 MG PO CAPS
0.4000 mg | ORAL_CAPSULE | Freq: Two times a day (BID) | ORAL | Status: DC
  Administered 2022-04-06 (×2): 0.4 mg via ORAL
  Filled 2022-04-06 (×2): qty 1

## 2022-04-06 MED ORDER — CHLORHEXIDINE GLUCONATE CLOTH 2 % EX PADS
6.0000 | MEDICATED_PAD | Freq: Once | CUTANEOUS | Status: AC
  Administered 2022-04-07: 6 via TOPICAL

## 2022-04-06 MED ORDER — ATORVASTATIN CALCIUM 20 MG PO TABS: 20.0000 mg | ORAL_TABLET | Freq: Every evening | ORAL | Status: DC

## 2022-04-06 MED ORDER — HEPARIN (PORCINE) 25000 UT/250ML-% IV SOLN
1250.0000 [IU]/h | INTRAVENOUS | Status: DC
  Administered 2022-04-06: 1000 [IU]/h via INTRAVENOUS
  Administered 2022-04-07: 1250 [IU]/h via INTRAVENOUS
  Filled 2022-04-06 (×2): qty 250

## 2022-04-06 MED ORDER — ATORVASTATIN CALCIUM 20 MG PO TABS
40.0000 mg | ORAL_TABLET | Freq: Every evening | ORAL | Status: DC
  Administered 2022-04-06: 40 mg via ORAL
  Filled 2022-04-06: qty 2

## 2022-04-06 MED ORDER — HEPARIN BOLUS VIA INFUSION
2400.0000 [IU] | Freq: Once | INTRAVENOUS | Status: AC
  Administered 2022-04-06: 2400 [IU] via INTRAVENOUS
  Filled 2022-04-06: qty 2400

## 2022-04-06 MED ORDER — FINASTERIDE 5 MG PO TABS
5.0000 mg | ORAL_TABLET | Freq: Every day | ORAL | Status: DC
  Administered 2022-04-06: 5 mg via ORAL
  Filled 2022-04-06: qty 1

## 2022-04-06 MED ORDER — ACETAMINOPHEN 325 MG PO TABS: 650.0000 mg | ORAL_TABLET | Freq: Four times a day (QID) | ORAL | Status: DC | PRN

## 2022-04-06 MED ORDER — HYDRALAZINE HCL 50 MG PO TABS: 50.0000 mg | ORAL_TABLET | Freq: Four times a day (QID) | ORAL | Status: DC | PRN

## 2022-04-06 MED ORDER — CLOPIDOGREL BISULFATE 75 MG PO TABS
75.0000 mg | ORAL_TABLET | Freq: Every day | ORAL | Status: DC
  Administered 2022-04-06: 75 mg via ORAL
  Filled 2022-04-06: qty 1

## 2022-04-06 MED ORDER — HYDRALAZINE HCL 20 MG/ML IJ SOLN
10.0000 mg | Freq: Four times a day (QID) | INTRAMUSCULAR | Status: DC | PRN
  Administered 2022-04-07: 10 mg via INTRAVENOUS
  Filled 2022-04-06: qty 1

## 2022-04-06 MED ORDER — AMLODIPINE BESYLATE 5 MG PO TABS
5.0000 mg | ORAL_TABLET | Freq: Every day | ORAL | Status: DC
  Administered 2022-04-06: 5 mg via ORAL
  Filled 2022-04-06: qty 1

## 2022-04-06 NOTE — Consult Note (Signed)
Hospital Consult    Reason for Consult:  Bilateral Carotid Stenosis Requesting Physician:  Dr. Shanda Howells MD MRN #:  [id number]  History of Present Illness: This is a 77 y.o. male with medical history significant of multiple medical issues including type 2 diabetes, BPH, coronary artery disease, hypertension, GERD, hyperlipidemia presenting with encephalopathy, UTI, hyperglycemia.  Limited history in the setting of encephalopathy//lethargy.  History primarily from ER physician, nursing as well as prior chart.  Patient reported new onset left-sided weakness prior to presentation.  Patient initially presented with headache nausea and vomiting.  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  Last known normal was approximately last night.  No reported fevers or chills no reported nausea or vomiting.   On exam this morning patient was sitting in bedside chair resting comfortably.  Patient's wife is at his side.  Patient denies any signs or symptoms of stroke this morning.  Patient denies any chest pain or shortness of breath.  Denies any vision changes dizziness nausea or vomiting.  Patient had no complaints overnight.  Vitals are remained stable.  Past Medical History:  Diagnosis Date   Anemia    Arthritis    Asthma    age 63   BPH with urinary obstruction    stable on flomax ([name]   Coronary atherosclerosis of unspecified type of vessel, native or graft    Diabetes mellitus    Type II   Emphysema of lung (HCC)    Esophageal stricture    Gastritis    GERD (gastroesophageal reflux disease)    Hiatal hernia    Hx of colonic polyps    Hyperlipidemia    Hypertension    Internal hemorrhoids without mention of complication    Lumbago    Pain in both lower legs    Pneumonia    PSA elevation    now averaging 2's   Tobacco abuse    URI (upper respiratory infection)    Urinary retention     Past Surgical History:  Procedure Laterality Date   2D Echo  06/03   Abdominal  ultrasound  06/03   Negative   BACK SURGERY     COLONOSCOPY     CT of head  and sinuses  06/03   Negative   CT of the chest, abdomen, pelvis  06/03   Chest negative; Abdomen, left adrenal lesion; Pelvis, enlarged prostate   ESOPHAGOGASTRODUODENOSCOPY  02/13/07   gastritis and duodenitis without bleed   ESOPHAGOGASTRODUODENOSCOPY N/A 06/26/2014   Procedure: ESOPHAGOGASTRODUODENOSCOPY (EGD);  Surgeon: Milus Banister, MD;  Location: [facility];  Service: Endoscopy;  Laterality: N/A;   ESOPHAGOGASTRODUODENOSCOPY (EGD) WITH PROPOFOL N/A 04/08/2020   Procedure: ESOPHAGOGASTRODUODENOSCOPY (EGD) WITH PROPOFOL;  Surgeon: Ladene Artist, MD;  Location: [facility];  Service: Endoscopy;  Laterality: N/A;   HOT HEMOSTASIS N/A 04/08/2020   Procedure: HOT HEMOSTASIS (ARGON PLASMA COAGULATION/BICAP);  Surgeon: Ladene Artist, MD;  Location: [facility];  Service: Endoscopy;  Laterality: N/A;   KNEE ARTHROSCOPY  10/00   Right   LEFT HEART CATH AND CORONARY ANGIOGRAPHY N/A 05/23/2021   Procedure: LEFT HEART CATH AND CORONARY ANGIOGRAPHY;  Surgeon: [name], MD;  Location: [facility];  Service: Cardiovascular;  Laterality: N/A;   LOWER EXTREMITY ANGIOGRAPHY Right 12/27/2017   Procedure: LOWER EXTREMITY ANGIOGRAPHY;  Surgeon: Algernon Huxley, MD;  Location: [facility];  Service: Cardiovascular;  Laterality: Right;   LOWER EXTREMITY ANGIOGRAPHY Left 01/10/2018   Procedure: LOWER EXTREMITY  ANGIOGRAPHY;  Surgeon: Algernon Huxley, MD;  Location: [name];  Service: Cardiovascular;  Laterality: Left;   LOWER EXTREMITY ANGIOGRAPHY Left 05/29/2018   Procedure: LOWER EXTREMITY ANGIOGRAPHY;  Surgeon: Algernon Huxley, MD;  Location: [facility];  Service: Cardiovascular;  Laterality: Left;   LOWER EXTREMITY ANGIOGRAPHY Left 05/30/2018   Procedure: Lower Extremity Angiography;  Surgeon: Algernon Huxley, MD;  Location: [facility];  Service: Cardiovascular;  Laterality: Left;    LP  06/03   Microwave thermotherapy prostate  08/28/07   [place]   Persantine cardiolite  03/01   EF 68%   POLYPECTOMY  11/95   Stress myoview  07/06   EF 67%   UPPER GASTROINTESTINAL ENDOSCOPY      No Known Allergies  Prior to Admission medications   Medication Sig Start Date End Date Taking? Authorizing Provider  acetaminophen (TYLENOL) 500 MG tablet Take 1,000 mg by mouth every 6 (six) hours as needed (hip pain.). Rapid release   Yes [provider]  aspirin 81 MG EC tablet Take 81 mg by mouth in the morning.   Yes [provider]  atorvastatin (LIPITOR) 20 MG tablet TAKE 1 TABLET BY MOUTH IN THE EVENING FOR CHOLESTEROL 01/21/22  Yes Pleas Koch, NP  clopidogrel (PLAVIX) 75 MG tablet TAKE 1 TABLET BY MOUTH EVERY DAY 08/10/21  Yes Kris Hartmann, NP  ferrous gluconate (FERGON) 324 MG tablet TAKE 1 TABLET BY MOUTH EVERY DAY WITH BREAKFAST 07/26/21  Yes Ladene Artist, MD  FIBER PO Take 1 capsule by mouth in the morning.   Yes [provider]  finasteride (PROSCAR) 5 MG tablet TAKE 1 TABLET BY MOUTH EVERY DAY 04/12/21  Yes McGowan, Larene Beach A, PA-C  fluticasone (FLONASE) 50 MCG/ACT nasal spray SPRAY 2 SPRAYS INTO EACH NOSTRIL EVERY DAY 10/28/21  Yes Dugal, Tabitha, FNP  glipiZIDE (GLUCOTROL) 10 MG tablet Take 10 mg by mouth daily before supper.  07/05/14  Yes [provider]  GLUCOSAMINE HCL PO Take 1 tablet by mouth daily.   Yes [provider]  Insulin Glargine (BASAGLAR KWIKPEN) 100 UNIT/ML Inject 30 Units into the skin daily.   Yes [provider]  lisinopril-hydrochlorothiazide (ZESTORETIC) 20-25 MG tablet Take 1 tablet by mouth daily.   Yes [provider]  metFORMIN (GLUCOPHAGE) 500 MG tablet TAKE 2 TABLETS BY MOUTH TWICE A DAY WITH MEAL FOR DIABETES 12/30/19  Yes Pleas Koch, NP  pantoprazole (PROTONIX) 40 MG tablet Take 1 tablet (40 mg total) by mouth daily. For heartburn 05/20/21  Yes Pleas Koch, NP   sildenafil (VIAGRA) 100 MG tablet Take 1 tablet (100 mg total) by mouth daily as needed for erectile dysfunction. 04/27/21  Yes McGowan, Larene Beach A, PA-C  tamsulosin (FLOMAX) 0.4 MG CAPS capsule TAKE 1 CAPSULE BY MOUTH 2 TIMES DAILY. 04/12/21  Yes McGowan, Larene Beach A, PA-C  vitamin B-12 (CYANOCOBALAMIN) 1000 MCG tablet Take 1,000 mcg by mouth in the morning.   Yes [provider]  Fluticasone-Umeclidin-Vilant (TRELEGY ELLIPTA) 100-62.5-25 MCG/ACT AEPB Inhale 1 puff into the lungs daily. Patient not taking: Reported on 04/05/2022 11/04/21   Tyler Pita, MD    Social History   Socioeconomic History   Marital status: Widowed    Spouse name: [name]   Number of children: 3   Years of education: Not on file   Highest education level: Not on file  Occupational History   Occupation: Retired Games developer  Tobacco Use   Smoking  status: Former    Packs/day: 2.00    Years: 57.00    Total pack years: 114.00    Types: Cigarettes    Start date: 02/07/1964    Quit date: 05/11/2021    Years since quitting: 0.9   Smokeless tobacco: Former    Types: Nurse, children's Use: Never used  Substance and Sexual Activity   Alcohol use: Not Currently    Alcohol/week: 0.0 standard drinks of alcohol    Comment: rare   Drug use: No   Sexual activity: Yes  Other Topics Concern   Not on file  Social History Narrative   Married 9/09 wife with moderate memory problems.   3 adult children, 2 grandchildren   Would desire CPR   Social Determinants of Health   Financial Resource Strain: Low Risk  (01/24/2022)   Overall Financial Resource Strain (CARDIA)    Difficulty of Paying Living Expenses: Not hard at all  Food Insecurity: No Food Insecurity (04/05/2022)   Hunger Vital Sign    Worried About Running Out of Food in the Last Year: Never true    Ran Out of Food in the Last Year: Never true  Transportation Needs: No Transportation Needs (04/05/2022)   PRAPARE - Radiographer, therapeutic (Medical): No    Lack of Transportation (Non-Medical): No  Physical Activity: Insufficiently Active (01/24/2022)   Exercise Vital Sign    Days of Exercise per Week: 3 days    Minutes of Exercise per Session: 30 min  Stress: No Stress Concern Present (01/24/2022)   [place]    Feeling of Stress : Not at all  Social Connections: Moderately Isolated (01/24/2022)   Social Connection and Isolation Panel [NHANES]    Frequency of Communication with Friends and Family: More than three times a week    Frequency of Social Gatherings with Friends and Family: Once a week    Attends Religious Services: More than 4 times per year    Active Member of [employer] or Organizations: No    Attends Archivist Meetings: Never    Marital Status: Widowed  Intimate Partner Violence: Not At Risk (04/05/2022)   Humiliation, Afraid, Rape, and Kick questionnaire    Fear of Current or Ex-Partner: No    Emotionally Abused: No    Physically Abused: No    Sexually Abused: No     Family History  Problem Relation Age of Onset   Diabetes Father    Alcohol abuse Paternal Uncle    Alcohol abuse Paternal Uncle    Alcohol abuse Paternal Uncle    Rheum arthritis Mother    Heart disease Neg Hx    Stroke Neg Hx    Cancer Neg Hx    Drug abuse Neg Hx    Depression Neg Hx    Colon cancer Neg Hx    Rectal cancer Neg Hx    Stomach cancer Neg Hx    Kidney cancer Neg Hx    Bladder Cancer Neg Hx    Prostate cancer Neg Hx     ROS: Otherwise negative unless mentioned in HPI  Physical Examination  Vitals:   04/05/22 2324 04/06/22 0747  BP: (!) 146/69 110/60  Pulse: 77 (!) 59  Resp: 18 17  Temp: 98.6 F (37 C) 98.7 F (37.1 C)  SpO2: 95% 98%   Body mass index is 28.72 kg/m.  General:  WDWN in NAD Gait: Not  observed HENT: WNL, normocephalic Pulmonary: normal non-labored breathing, without Rales, rhonchi,   wheezing Cardiac: regular, without  Murmurs, rubs or gallops; without carotid bruits Abdomen: Positive bowel sounds, soft, NT/ND, no masses Skin: without rashes Vascular Exam/Pulses: Palpable Radial, Post tibial pulses. Extremities: without ischemic changes, without Gangrene , without cellulitis; without open wounds;  Musculoskeletal: no muscle wasting or atrophy  Neurologic: A&O X 3;  No focal weakness or paresthesias are detected; speech is fluent/normal. Complete neuro exam done this morning. Patient is without any deficits and all extremities strength +5.  Psychiatric:  The pt has Normal affect. Lymph:  Unremarkable  CBC    Component Value Date/Time   WBC 11.7 (H) 04/06/2022 0320   RBC 4.40 04/06/2022 0320   HGB 12.8 (L) 04/06/2022 0320   HCT 38.5 (L) 04/06/2022 0320   PLT 314 04/06/2022 0320   MCV 87.5 04/06/2022 0320   MCH 29.1 04/06/2022 0320   MCHC 33.2 04/06/2022 0320   RDW 13.5 04/06/2022 0320   LYMPHSABS 0.4 (L) 04/05/2022 0839   MONOABS 0.2 04/05/2022 0839   EOSABS 0.0 04/05/2022 0839   BASOSABS 0.0 04/05/2022 0839    BMET    Component Value Date/Time   NA 139 04/06/2022 0320   NA 141 07/10/2014 1025   K 3.5 04/06/2022 0320   CL 104 04/06/2022 0320   CO2 26 04/06/2022 0320   GLUCOSE 143 (H) 04/06/2022 0320   BUN 31 (H) 04/06/2022 0320   BUN 12 07/10/2014 1025   CREATININE 1.46 (H) 04/06/2022 0320   CALCIUM 8.3 (L) 04/06/2022 0320   GFRNONAA 49 (L) 04/06/2022 0320   GFRAA >60 05/31/2018 0529    COAGS: Lab Results  Component Value Date   INR 1.1 04/05/2022   INR 1.1 05/29/2018     Non-Invasive Vascular Imaging:   EXAM: CT ANGIOGRAPHY HEAD AND NECK  IMPRESSION: 1. No emergent large vessel occlusion. 2. 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 3. Severe (approximately 70%) stenosis of the right ICA origin the neck. 4. Moderate atherosclerotic narrowing of the intracranial ICAs bilaterally. 5. Small/non dominant  right vertebral artery is poorly opacified intradurally. 6. Left hilar soft tissue with adjacent probable post treatment change in the left upper lobe. Also, significant interval increase in left paratracheal 3.2 by 1.6 cm lymph node. A follow-up CT of the chest (preferably with contrast) is recommended to better assess for disease progression. 7. Esophageal wall thickening.  Statin:  Yes.   Beta Blocker:  No. Aspirin:  Yes.   ACEI:  Yes.   ARB:  No. CCB use:  No Other antiplatelets/anticoagulants:  Yes.   Plavix 75 mg Daily   ASSESSMENT/PLAN: This is a 77 y.o. male resting comfortably sitting at bedside chair this morning.  Patient without any neurological deficits related to his severe stenosis of bilateral carotid arteries.  Plan: Vascular surgery will take the patient to the vascular lab tomorrow for a left internal carotid artery angiogram with possible angioplasty and possible stent placement.  Heparin infusion was started this afternoon.  Patient's aspirin and Plavix will be held at this time.   I discussed the procedure, benefits, risks, complications, with the patient and his wife.  Both verbalized her understanding.  I answered all the patient's questions this morning.  Both would like to proceed with the procedure as soon as possible.  Patient will be made n.p.o. after midnight tonight for a left carotid angiogram with possible intervention tomorrow.   -Plan was discussed with Dr.  Hortencia Pilar MD and he is in agreement with the plan.   Drema Pry [facility] 04/06/2022 9:25 AM

## 2022-04-06 NOTE — Procedures (Signed)
History: 77 year old male who presented with transient left-sided symptoms  Sedation: None  Technique: This EEG was acquired with electrodes placed according to the International 10-20 electrode system (including Fp1, Fp2, F3, F4, C3, C4, P3, P4, O1, O2, T3, T4, T5, T6, A1, A2, Fz, Cz, Pz). The following electrodes were missing or displaced: none.   Background: The background consists of intermixed alpha and beta activities. There is a well defined posterior dominant rhythm of seven to eight hz that attenuates with eye opening.  At times there is suggestion of asymmetric posterior dominant rhythm with attenuation on the right, but this does not usually meet the greater than 50% requirement for asymmetry.  There is an increase in slow activity associated with  Photic stimulation: Physiologic driving is present  EEG Abnormalities: Borderline slow PDR  Clinical Interpretation: This EEG is recorded in the waking and drowsy state.  With borderline PDR, this can be seen in the elderly is a normal variant, but can also represent a mild encephalopathy.  There was no seizure or seizure predisposition recorded on this study. Please note that lack of epileptiform activity on EEG does not preclude the possibility of epilepsy.   Roland Rack, MD [facility] [phone]  If 7pm- 7am, please page neurology on call as listed in [place].

## 2022-04-06 NOTE — Assessment & Plan Note (Signed)
Initial presentation with weakness, generalized lethargy as well as left-sided hemiparesis with some concern for CVA. Migraine also differential diagnosis Noted prior history of posterior CVA CTA head and neck w/ 80% stenosis on L and 70%stenosis on R Code stroke initiated with workup including a negative MRI Noted concomitant hyperglycemia and urinary tract infection. Suspect toxic metabolic component to overall presentation  Neurology recommendations appreciated Consider vascular surgery eval as clinically appropriate  Will plan for treatment for UTI with Rocephin as well as blood sugar control Check ammonia level Reassess as clinically necessary  Follow up neuro recs Continue to monitor for now

## 2022-04-06 NOTE — Progress Notes (Signed)
Mobility Specialist - Progress Note   04/06/22 1440  Mobility  Activity Dangled on edge of bed  Level of Assistance Independent  Assistive Device None  Distance Ambulated (ft) 0 ft  Activity Response Tolerated well  Mobility Referral Yes  $Mobility charge 1 Mobility   Pt supine in bed on RA upon arrival. Pt completes bed mobility and scoots to EOB indep. Pt able to maintain balance EOB 3~5 minutes indep. Pt defers any further mobility on this date. Pt left EOB with needs in reach, wife present, and bed alarm activated.   Gretchen Short  Mobility Specialist  04/06/22 2:42 PM

## 2022-04-06 NOTE — Progress Notes (Addendum)
Subjective: Feels back to normal.   Exam: Vitals:   04/05/22 2324 04/06/22 0747  BP: (!) 146/69 110/60  Pulse: 77 (!) 59  Resp: 18 17  Temp: 98.6 F (37 C) 98.7 F (37.1 C)  SpO2: 95% 98%   Gen: In bed, NAD Resp: non-labored breathing, no acute distress Abd: soft, nt  Neuro: MS: awake, alert, oriented CN: PERRL, no visual extinction, face symmetric Motor: 5/5 throughout Sensory:intact to LT, no extinction to DSS  Pertinent Labs: A1c pending.   Impression: 77 yo M with previous RMCA territory infarct who presented yesterday with headache consistent with migraine and symptoms consistent with R MCA stroke. Usually, if symptoms are persistent for as long as his were, MRI would reveal evidence of ischemia. That being said, I am not sure I can definiteively rule out TIA as etiology. In any case, with carotid stenosis, embolic appearing ipsilateral stroke, and no other explanation, I would favor calling his stenosis symptomatic and he is going to be seen by vascular surgery.   Recommendations: 1) Continue antiplatelets with home asa 81 and plavix '75mg'$  2) EEG 3) lipids, echo 4) would favor high intensity statin, increasing to '40mg'$  daily, lipids pending 5) Vascular surgery referral 6) will follow.   Roland Rack, MD [facility] [phone]  If 7pm- 7am, please page neurology on call as listed in [place].

## 2022-04-06 NOTE — H&P (View-Only) (Signed)
[facility]  Daily Progress Note   Subjective  -   Patient sitting up in bed when I entered.  He states he is feeling much better he voices a clear understanding of what has transpired over the last day or so.  The 1 thing he is not necessarily certain of is whether he truly had left-sided weakness.  I have personally reviewed the CT angiogram of the head and neck.  Clearly he has critical stenosis of both the right and left internal carotid arteries at their origin.  Based on the imaging the manner in which the radiologist has calculated the degree of stenosis appears to under call both the right and left sides.  Given that treatment for the right and the left carotid arteries is indicated.  Objective Vitals:   04/05/22 2324 04/06/22 0747 04/06/22 1540 04/06/22 1916  BP: (!) 146/69 110/60 136/77 (!) 147/79  Pulse: 77 (!) 59 73 79  Resp: '18 17 17 18  '$ Temp: 98.6 F (37 C) 98.7 F (37.1 C) 98.1 F (36.7 C) 98.3 F (36.8 C)  TempSrc:      SpO2: 95% 98% 93%   Weight:      Height:        Intake/Output Summary (Last 24 hours) at 04/06/2022 2019 Last data filed at 04/06/2022 1917 Gross per 24 hour  Intake 1749.52 ml  Output 1075 ml  Net 674.52 ml    PULM  CTAB CV  RRR VASC  carotid bruits bilaterally Neuro the patient's upper extremities are equal and 5/5 strength.  The patient's lower extremities also demonstrate 5/5 strength with the exception of the foot and ankle on the left.  However, this is secondary to previous surgery and fusion of his ankle foot and is not representative of weakness.  Laboratory CBC    Component Value Date/Time   WBC 11.7 (H) 04/06/2022 0320   HGB 12.8 (L) 04/06/2022 0320   HCT 38.5 (L) 04/06/2022 0320   PLT 314 04/06/2022 0320    BMET    Component Value Date/Time   NA 139 04/06/2022 0320   NA 141 07/10/2014 1025   K 3.5 04/06/2022 0320   CL 104 04/06/2022 0320   CO2 26 04/06/2022 0320   GLUCOSE 143 (H) 04/06/2022  0320   BUN 31 (H) 04/06/2022 0320   BUN 12 07/10/2014 1025   CREATININE 1.46 (H) 04/06/2022 0320   CALCIUM 8.3 (L) 04/06/2022 0320   GFRNONAA 49 (L) 04/06/2022 0320   GFRAA >60 05/31/2018 0529    Assessment/Planning: Symptomatic carotid artery stenosis: Recommend:  The patient is symptomatic with respect to the carotid stenosis.  The patient now has progressed and has a lesion the is >80% bilateral.  He has a history of radiation therapy to the lung field which is likely to have spillover to the neck.  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  This makes him higher risk for surgery.  These factors support stenting over endarterectomy.  I see no advantage to TCAR in any way.  I have completed Shared Decision-Making with [name] prior to carotid surgery/intervention this evening. The conversation included: -Discussion of all treatment options including carotid endarterectomy (CEA), carotid artery stenting (which includes transcarotid artery revascularization (TCAR)), and optimal medical therapy (OMT). -Explanation of risks and benefits for each option specific to Jenny Reichmann E Gacek's clinical situation. -Integration of clinical guidelines as it relates to the patient's history and comorbidities. -Discussion and incorporation of Jenny Reichmann  Leanna Battles and their personal preferences and priorities in choosing a treatment plan plan  If the patient was unable to participate in Shared Decision Making this process was done with the patient.  Patient's CT angiography of the carotid arteries confirms >80% bilateral ICA stenosis.  The anatomical considerations support stenting over surgery.  This was discussed in detail with the patient.  The risks, benefits and alternative therapies were reviewed in detail with the patient.  All questions were answered.  The patient agrees to proceed with stenting of the bilateral carotid arteries.  The only question that remains is  doing to treat the right or the left first by my read of the CT angiogram the left carotid is indeed slightly worse than the right.  He is right-handed.  This would support treating the left side first.  However, if he indeed had weakness of his left arm then this would be the more pressing side.  This is an area of which she is uncertain and therefore when his significant other is present tomorrow we will discuss with her the event as she was a witness.  In the event she is very clear about left arm weakness then I will treat the right internal carotid artery first.  Otherwise I would opt to treat the left internal carotid artery first as this is correlating with his dominant hand.  The patient's NIHSS score is as follows: 1 Mild: 1 - 5 Mild to Moderately Severe: 5 - 14 Severe: 15 - 24 Very Severe: >25  Continue antiplatelet therapy as prescribed. Continue management of CAD, HTN and Hyperlipidemia. Healthy heart diet, encouraged exercise at least 4 times per week.   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  Patient has significant coronary artery disease as well as complete heart block and therefore it is my opinion that stenting would be better than surgery  Continue statin as ordered and reviewed, no changes at this time  Nitrates PRN for chest pain  Lung carcinoma status postradiation therapy Given his history of radiation although not directly to the neck certainly the neck is in the region that was treated and therefore this would complicate surgical treatment and stenting seems to be the better option.  Hortencia Pilar  04/06/2022, 8:19 PM

## 2022-04-06 NOTE — Consult Note (Signed)
ANTICOAGULATION CONSULT NOTE - Initial Consult  Pharmacy Consult for heparin drip Indication: Possible TIA and carotid vascular procedure planned for 04/07/22 No Known Allergies  Patient Measurements: Height: '5\' 6"'$  (167.6 cm) Weight: 80.7 kg (177 lb 14.6 oz) IBW/kg (Calculated) : 63.8 Heparin Dosing Weight: 80 kg  Vital Signs: Temp: 98.7 F (37.1 C) (02/29 0747) BP: 110/60 (02/29 0747) Pulse Rate: 59 (02/29 0747)  Labs: Recent Labs    04/05/22 0839 04/05/22 1504 04/06/22 0320  HGB 13.0 14.1 12.8*  HCT 40.1 43.1 38.5*  PLT 309 345 314  APTT 30  --   --   LABPROT 13.6  --   --   INR 1.1  --   --   CREATININE 0.90 1.12 1.46*  TROPONINIHS 22*  18*  --   --     Estimated Creatinine Clearance: 42.3 mL/min (A) (by C-G formula based on SCr of 1.46 mg/dL (H)).   Medical History: Past Medical History:  Diagnosis Date   Anemia    Arthritis    Asthma    age 77   BPH with urinary obstruction    stable on flomax ([name]   Coronary atherosclerosis of unspecified type of vessel, native or graft    Diabetes mellitus    Type II   Emphysema of lung (HCC)    Esophageal stricture    Gastritis    GERD (gastroesophageal reflux disease)    Hiatal hernia    Hx of colonic polyps    Hyperlipidemia    Hypertension    Internal hemorrhoids without mention of complication    Lumbago    Pain in both lower legs    Pneumonia    PSA elevation    now averaging 2's   Tobacco abuse    URI (upper respiratory infection)    Urinary retention     Medications:  No home anticoagulation per pharmacist review  Assessment: 77 yo male with history of CVA presented to ED with headache and symptoms concerning for stroke.  MRI of brain did not show acute infarct.  Patient was found to have carotid artery stenosis.  Vascular procedure planned for 04/07/22.  Pharmacy consulted to initiate heparin drip.  Baseline labs: hgb 12.8, plt = 314, aPTT pending, PT/INR pending  Goal of Therapy:   Heparin level 0.3-0.7 units/ml -- Vascular wishes to target standard therapeutic range Monitor platelets by anticoagulation protocol: Yes   Plan:  --No initial bolus --Start heparin drip at 1000 units/hr --Check heparin level 8 hours after initiation  --Daily CBC while on heparin  Lorin Picket, PharmD 04/06/2022,11:49 AM

## 2022-04-06 NOTE — Progress Notes (Signed)
Eeg done 

## 2022-04-06 NOTE — Progress Notes (Signed)
[facility] Progress Note  Patient: Antonio [name]    [id number]  DOA: 04/05/2022     Date of Service: the patient was seen and examined on 04/06/2022  Chief Complaint  Patient presents with   Abdominal Pain   Chest Pain   Brief hospital course: JONERIC DEVEREUX is a 77 y.o. male with medical history significant of multiple medical issues including type 2 diabetes, BPH, coronary artery disease, hypertension, GERD, hyperlipidemia presenting with encephalopathy, UTI, hyperglycemia.  Limited history in the setting of encephalopathy//lethargy.  As per H&P patient presented with left-sided weakness prior to presentation, headache, nausea and vomiting. ED workup, code stroke workup was negative.  Patient was found to have UTI, carotid stenosis.  MRI negative.  Neurology was consulted and patient was admitted under hospital service for further management as below.   Assessment and Plan:   # Acute metabolic encephalopathy, CVA ruled out Continue neurocheck as per protocol Continue to monitor on telemetry Neurology consulted, recommended EEG and 2D echocardiogram to rule out intracardiac source blood clot  # UTI, UA positive Continue ceftriaxone 2 g IV daily Follow urine culture   # AKI, Baseline creatinine 0.9 Creatinine 1.46 elevated could be secondary to IV contrast Continue IV fluid and oral hydration Check bladder scan to rule out urine retention Monitor renal functions Avoid nephrotoxic medications and use renally dose medications   # Carotid stenosis, bilateral CTA head and neck w/ 80% stenosis on L and 70%stenosis on R  Vascular surgery consulted   # History of CVA (cerebrovascular accident) Noted large old right parietal stroke without acute findings code stroke evaluation.   continue home aspirin, Plavix and statin LDL 48, triglyceride 243 Follow-up and formal neurology recommendations   # UTI (urinary tract infection) Urinalysis indicative of infection with  noted new onset encephalopathy and confusion as well as leukocytosis with white count of 13 IV Rocephin Urine culture Follow   # GERD (gastroesophageal reflux disease) PPI   # Type 2 diabetes mellitus with hyperglycemia (HCC) Blood sugar in 300s on presentation without ketoacidosis Will start Scale insulin Titrate regimen A1c Follow   # Essential hypertension Systolic BP in [id number] on presentation  MRI negative for acute CVA  Titrate home regimen.   # HLD (hyperlipidemia) Cont statin   # BPH, continued Proscar and Flomax home dose  # Incidental findings, left hilar tissue and mediastinal lymphadenopathy CTA neck:  Left hilar soft tissue with adjacent probable post treatment change in the left upper lobe. Also, significant interval increase in left paratracheal 3.2 by 1.6 cm lymph node. A follow-up CT of the chest (preferably with contrast) is recommended to better assess for disease progression. Patient already received IV contrast so CT chest was not done, creatinine is slightly elevated.  Patient was advised to follow with PCP for CT chest as an outpatient and further workup.   Body mass index is 28.72 kg/m.  Interventions:      Diet: Carb modified diet DVT Prophylaxis: SCDs, patient is already on DAPT  Advance goals of care discussion: Full code  Family Communication: family was present at bedside, at the time of interview.  The pt provided permission to discuss medical plan with the family. Opportunity was given to ask question and all questions were answered satisfactorily.   Disposition:  Pt is from Home, admitted with AMS, UTI, EEG and TTE pending, blood culture pending, still on IV Abx which precludes a safe discharge. Discharge to Home, when clinically stable, most likely tomorrow a.m.[name]  Subjective: No significant events overnight, patient denies any headache or dizziness, no new neurological symptoms, no weakness.  Denies any chest pain or palpitation, no  shortness of breath.   Physical Exam: General: NAD, lying comfortably Appear in no distress, affect appropriate Eyes: PERRLA ENT: Oral Mucosa Clear, moist  Neck: no JVD,  Cardiovascular: S1 and S2 Present, no Murmur,  Respiratory: good respiratory effort, Bilateral Air entry equal and Decreased, no Crackles, no wheezes Abdomen: Bowel Sound present, Soft and no tenderness,  Skin: no rashes Extremities: no Pedal edema, no calf tenderness Neurologic: without any new focal findings Gait not checked due to patient safety concerns  Vitals:   04/05/22 1609 04/05/22 2046 04/05/22 2324 04/06/22 0747  BP: 126/61 (!) 113/46 (!) 146/69 110/60  Pulse: 71 82 77 (!) 59  Resp: '18 19 18 17  '$ Temp: 99 F (37.2 C) 98.2 F (36.8 C) 98.6 F (37 C) 98.7 F (37.1 C)  TempSrc: Oral     SpO2: 94% 98% 95% 98%  Weight:      Height:        Intake/Output Summary (Last 24 hours) at 04/06/2022 1420 Last data filed at 04/06/2022 1409 Gross per 24 hour  Intake 1749.52 ml  Output 1075 ml  Net 674.52 ml   Filed Weights   04/05/22 0744  Weight: 80.7 kg    Data Reviewed: I have personally reviewed and interpreted daily labs, tele strips, imagings as discussed above. I reviewed all nursing notes, pharmacy notes, vitals, pertinent old records I have discussed plan of care as described above with RN and patient/family.  CBC: Recent Labs  Lab 04/05/22 0839 04/05/22 1504 04/06/22 0320  WBC 13.3* 12.1* 11.7*  NEUTROABS 12.6*  --   --   HGB 13.0 14.1 12.8*  HCT 40.1 43.1 38.5*  MCV 87.9 87.2 87.5  PLT 309 345 [id number]   Basic Metabolic Panel: Recent Labs  Lab 04/05/22 0839 04/05/22 1504 04/06/22 0320  NA 132*  --  139  K 4.4  --  3.5  CL 98  --  104  CO2 23  --  26  GLUCOSE 378*  --  143*  BUN 19  --  31*  CREATININE 0.90 1.12 1.46*  CALCIUM 8.3*  --  8.3*    Studies: No results found.  Scheduled Meds:  amLODipine  5 mg Oral Daily   aspirin EC  81 mg Oral q AM   atorvastatin  40 mg  Oral QPM   clopidogrel  75 mg Oral Daily   finasteride  5 mg Oral Daily   insulin aspart  0-15 Units Subcutaneous Q4H   insulin detemir  0.2 Units/kg Subcutaneous Daily   pantoprazole  40 mg Oral Daily   tamsulosin  0.4 mg Oral BID   Continuous Infusions:  sodium chloride 75 mL/hr at 04/06/22 1344   cefTRIAXone (ROCEPHIN)  IV 2 g (04/06/22 1127)   heparin 1,000 Units/hr (04/06/22 1229)   PRN Meds: acetaminophen, hydrALAZINE, hydrALAZINE, labetalol, ondansetron **OR** ondansetron (ZOFRAN) IV  Time spent: 35 minutes  Author: Val Riles. MD [facility] 04/06/2022 2:20 PM  To reach On-call, see care teams to locate the attending and reach out to them via [email or web address]. If 7PM-7AM, please contact night-coverage If you still have difficulty reaching the attending provider, please page the [facility] (Director on Call) for [facility] on amion for assistance.

## 2022-04-06 NOTE — Progress Notes (Signed)
*  PRELIMINARY RESULTS* Echocardiogram 2D Echocardiogram has been performed.  Antonio [name] 04/06/2022, 1:15 PM

## 2022-04-06 NOTE — Consult Note (Signed)
ANTICOAGULATION CONSULT NOTE - Initial Consult  Pharmacy Consult for heparin drip Indication: Possible TIA and carotid vascular procedure planned for 04/07/22 No Known Allergies  Patient Measurements: Height: '5\' 6"'$  (167.6 cm) Weight: 80.7 kg (177 lb 14.6 oz) IBW/kg (Calculated) : 63.8 Heparin Dosing Weight: 80 kg  Vital Signs: Temp: 98.3 F (36.8 C) (02/29 1916) BP: 147/79 (02/29 1916) Pulse Rate: 79 (02/29 1916)  Labs: Recent Labs    04/05/22 0839 04/05/22 1504 04/06/22 0320 04/06/22 1221 04/06/22 2021  HGB 13.0 14.1 12.8*  --   --   HCT 40.1 43.1 38.5*  --   --   PLT 309 345 314  --   --   APTT 30  --   --  30  --   LABPROT 13.6  --   --  13.5  --   INR 1.1  --   --  1.0  --   HEPARINUNFRC  --   --   --   --  0.17*  CREATININE 0.90 1.12 1.46*  --   --   TROPONINIHS 22*  18*  --   --   --   --      Estimated Creatinine Clearance: 42.3 mL/min (A) (by C-G formula based on SCr of 1.46 mg/dL (H)).   Medical History: Past Medical History:  Diagnosis Date   Anemia    Arthritis    Asthma    age 72   BPH with urinary obstruction    stable on flomax ([name]   Coronary atherosclerosis of unspecified type of vessel, native or graft    Diabetes mellitus    Type II   Emphysema of lung (HCC)    Esophageal stricture    Gastritis    GERD (gastroesophageal reflux disease)    Hiatal hernia    Hx of colonic polyps    Hyperlipidemia    Hypertension    Internal hemorrhoids without mention of complication    Lumbago    Pain in both lower legs    Pneumonia    PSA elevation    now averaging 2's   Tobacco abuse    URI (upper respiratory infection)    Urinary retention     Medications:  No home anticoagulation per pharmacist review  Assessment: 77 yo male with history of CVA presented to ED with headache and symptoms concerning for stroke.  MRI of brain did not show acute infarct.  Patient was found to have carotid artery stenosis.  Vascular procedure planned for  04/07/22.  Pharmacy consulted to initiate heparin drip.  Baseline labs: hgb 12.8, plt = 314, aPTT pending, PT/INR pending  Goal of Therapy:  Heparin level 0.3-0.7 units/ml -- Vascular wishes to target standard therapeutic range Monitor platelets by anticoagulation protocol: Yes   Plan:  2/29:   HL @ 2021 = 0.17, subtherapeutic Will order heparin 2400 units IV X 1 bolus and increase drip rate to 1250 units/hr. Will recheck HL 8 hrs after rate change.  ----Daily CBC while on heparin  Tyquisha Sharps D, PharmD 04/06/2022,9:41 PM

## 2022-04-06 NOTE — Progress Notes (Signed)
Vein and Vascular Surgery  Daily Progress Note   Subjective  -   Patient sitting up in bed when I entered.  He states he is feeling much better he voices a clear understanding of what has transpired over the last day or so.  The 1 thing he is not necessarily certain of is whether he truly had left-sided weakness.  I have personally reviewed the CT angiogram of the head and neck.  Clearly he has critical stenosis of both the right and left internal carotid arteries at their origin.  Based on the imaging the manner in which the radiologist has calculated the degree of stenosis appears to under call both the right and left sides.  Given that treatment for the right and the left carotid arteries is indicated.  Objective Vitals:   04/05/22 2324 04/06/22 0747 04/06/22 1540 04/06/22 1916  BP: (!) 146/69 110/60 136/77 (!) 147/79  Pulse: 77 (!) 59 73 79  Resp: '18 17 17 18  '$ Temp: 98.6 F (37 C) 98.7 F (37.1 C) 98.1 F (36.7 C) 98.3 F (36.8 C)  TempSrc:      SpO2: 95% 98% 93%   Weight:      Height:        Intake/Output Summary (Last 24 hours) at 04/06/2022 2019 Last data filed at 04/06/2022 1917 Gross per 24 hour  Intake 1749.52 ml  Output 1075 ml  Net 674.52 ml    PULM  CTAB CV  RRR VASC  carotid bruits bilaterally Neuro the patient's upper extremities are equal and 5/5 strength.  The patient's lower extremities also demonstrate 5/5 strength with the exception of the foot and ankle on the left.  However, this is secondary to previous surgery and fusion of his ankle foot and is not representative of weakness.  Laboratory CBC    Component Value Date/Time   WBC 11.7 (H) 04/06/2022 0320   HGB 12.8 (L) 04/06/2022 0320   HCT 38.5 (L) 04/06/2022 0320   PLT 314 04/06/2022 0320    BMET    Component Value Date/Time   NA 139 04/06/2022 0320   NA 141 07/10/2014 1025   K 3.5 04/06/2022 0320   CL 104 04/06/2022 0320   CO2 26 04/06/2022 0320   GLUCOSE 143 (H) 04/06/2022  0320   BUN 31 (H) 04/06/2022 0320   BUN 12 07/10/2014 1025   CREATININE 1.46 (H) 04/06/2022 0320   CALCIUM 8.3 (L) 04/06/2022 0320   GFRNONAA 49 (L) 04/06/2022 0320   GFRAA >60 05/31/2018 0529    Assessment/Planning: Symptomatic carotid artery stenosis: Recommend:  The patient is symptomatic with respect to the carotid stenosis.  The patient now has progressed and has a lesion the is >80% bilateral.  He has a history of radiation therapy to the lung field which is likely to have spillover to the neck.  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  This makes him higher risk for surgery.  These factors support stenting over endarterectomy.  I see no advantage to TCAR in any way.  I have completed Shared Decision-Making with [name] prior to carotid surgery/intervention this evening. The conversation included: -Discussion of all treatment options including carotid endarterectomy (CEA), carotid artery stenting (which includes transcarotid artery revascularization (TCAR)), and optimal medical therapy (OMT). -Explanation of risks and benefits for each option specific to Jenny Reichmann E Jay's clinical situation. -Integration of clinical guidelines as it relates to the patient's history and comorbidities. -Discussion and incorporation of Jenny Reichmann  Leanna Battles and their personal preferences and priorities in choosing a treatment plan plan  If the patient was unable to participate in Shared Decision Making this process was done with the patient.  Patient's CT angiography of the carotid arteries confirms >80% bilateral ICA stenosis.  The anatomical considerations support stenting over surgery.  This was discussed in detail with the patient.  The risks, benefits and alternative therapies were reviewed in detail with the patient.  All questions were answered.  The patient agrees to proceed with stenting of the bilateral carotid arteries.  The only question that remains is  doing to treat the right or the left first by my read of the CT angiogram the left carotid is indeed slightly worse than the right.  He is right-handed.  This would support treating the left side first.  However, if he indeed had weakness of his left arm then this would be the more pressing side.  This is an area of which she is uncertain and therefore when his significant other is present tomorrow we will discuss with her the event as she was a witness.  In the event she is very clear about left arm weakness then I will treat the right internal carotid artery first.  Otherwise I would opt to treat the left internal carotid artery first as this is correlating with his dominant hand.  The patient's NIHSS score is as follows: 1 Mild: 1 - 5 Mild to Moderately Severe: 5 - 14 Severe: 15 - 24 Very Severe: >25  Continue antiplatelet therapy as prescribed. Continue management of CAD, HTN and Hyperlipidemia. Healthy heart diet, encouraged exercise at least 4 times per week.   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  Patient has significant coronary artery disease as well as complete heart block and therefore it is my opinion that stenting would be better than surgery  Continue statin as ordered and reviewed, no changes at this time  Nitrates PRN for chest pain  Lung carcinoma status postradiation therapy Given his history of radiation although not directly to the neck certainly the neck is in the region that was treated and therefore this would complicate surgical treatment and stenting seems to be the better option.  Hortencia Pilar  04/06/2022, 8:19 PM

## 2022-04-07 ENCOUNTER — Encounter
Admission: EM | Disposition: A | Discharge status: Home / Self Care | Payer: Self-pay | Source: Home / Self Care | Attending: Student

## 2022-04-07 DIAGNOSIS — I6521 Occlusion and stenosis of right carotid artery: Secondary | ICD-10-CM | POA: Diagnosis not present

## 2022-04-07 DIAGNOSIS — I2510 Atherosclerotic heart disease of native coronary artery without angina pectoris: Secondary | ICD-10-CM | POA: Diagnosis not present

## 2022-04-07 DIAGNOSIS — I639 Cerebral infarction, unspecified: Secondary | ICD-10-CM | POA: Diagnosis not present

## 2022-04-07 DIAGNOSIS — G9340 Encephalopathy, unspecified: Secondary | ICD-10-CM | POA: Diagnosis not present

## 2022-04-07 DIAGNOSIS — I442 Atrioventricular block, complete: Secondary | ICD-10-CM | POA: Diagnosis not present

## 2022-04-07 HISTORY — PX: CAROTID PTA/STENT INTERVENTION: CATH118231

## 2022-04-07 LAB — GLUCOSE, CAPILLARY
Glucose-Capillary: 119 mg/dL — ABNORMAL HIGH (ref 70–99)
Glucose-Capillary: 142 mg/dL — ABNORMAL HIGH (ref 70–99)
Glucose-Capillary: 143 mg/dL — ABNORMAL HIGH (ref 70–99)
Glucose-Capillary: 145 mg/dL — ABNORMAL HIGH (ref 70–99)
Glucose-Capillary: 199 mg/dL — ABNORMAL HIGH (ref 70–99)
Glucose-Capillary: 221 mg/dL — ABNORMAL HIGH (ref 70–99)

## 2022-04-07 LAB — PHOSPHORUS: Phosphorus: 3.5 mg/dL (ref 2.5–4.6)

## 2022-04-07 LAB — CBC
HCT: 36.3 % — ABNORMAL LOW (ref 39.0–52.0)
Hemoglobin: 11.7 g/dL — ABNORMAL LOW (ref 13.0–17.0)
MCH: 28.3 pg (ref 26.0–34.0)
MCHC: 32.2 g/dL (ref 30.0–36.0)
MCV: 87.9 fL (ref 80.0–100.0)
Platelets: 266 10*3/uL (ref 150–400)
RBC: 4.13 MIL/uL — ABNORMAL LOW (ref 4.22–5.81)
RDW: 13.3 % (ref 11.5–15.5)
WBC: 7.3 10*3/uL (ref 4.0–10.5)
nRBC: 0 % (ref 0.0–0.2)

## 2022-04-07 LAB — HEPARIN LEVEL (UNFRACTIONATED)
Heparin Unfractionated: 0.44 IU/mL (ref 0.30–0.70)
Heparin Unfractionated: 0.48 IU/mL (ref 0.30–0.70)

## 2022-04-07 LAB — BASIC METABOLIC PANEL
Anion gap: 9 (ref 5–15)
BUN: 33 mg/dL — ABNORMAL HIGH (ref 8–23)
CO2: 24 mmol/L (ref 22–32)
Calcium: 8.1 mg/dL — ABNORMAL LOW (ref 8.9–10.3)
Chloride: 107 mmol/L (ref 98–111)
Creatinine, Ser: 1.17 mg/dL (ref 0.61–1.24)
GFR, Estimated: >60 mL/min (ref >60)
Glucose, Bld: 108 mg/dL — ABNORMAL HIGH (ref 70–99)
Potassium: 3.5 mmol/L (ref 3.5–5.1)
Sodium: 140 mmol/L (ref 135–145)

## 2022-04-07 LAB — MAGNESIUM: Magnesium: 1.5 mg/dL — ABNORMAL LOW (ref 1.7–2.4)

## 2022-04-07 LAB — POCT ACTIVATED CLOTTING TIME: Activated Clotting Time: 293 seconds

## 2022-04-07 SURGERY — CAROTID PTA/STENT INTERVENTION
Anesthesia: Moderate Sedation | Laterality: Right

## 2022-04-07 MED ORDER — CLOPIDOGREL BISULFATE 75 MG PO TABS
75.0000 mg | ORAL_TABLET | Freq: Every day | ORAL | Status: DC
  Administered 2022-04-08: 75 mg via ORAL
  Filled 2022-04-07: qty 1

## 2022-04-07 MED ORDER — ATROPINE SULFATE 1 MG/10ML IJ SOSY
PREFILLED_SYRINGE | INTRAMUSCULAR | Status: AC
  Filled 2022-04-07: qty 10

## 2022-04-07 MED ORDER — CHLORHEXIDINE GLUCONATE CLOTH 2 % EX PADS
6.0000 | MEDICATED_PAD | Freq: Every day | CUTANEOUS | Status: DC
  Administered 2022-04-07 – 2022-04-08 (×2): 6 via TOPICAL

## 2022-04-07 MED ORDER — SODIUM CHLORIDE 0.9 % IV SOLN: INTRAVENOUS | Status: DC

## 2022-04-07 MED ORDER — ACETAMINOPHEN 325 MG PO TABS
325.0000 mg | ORAL_TABLET | ORAL | Status: DC | PRN
  Administered 2022-04-07: 325 mg via ORAL
  Administered 2022-04-08: 650 mg via ORAL
  Filled 2022-04-07: qty 1
  Filled 2022-04-07: qty 2

## 2022-04-07 MED ORDER — MAGNESIUM SULFATE 2 GM/50ML IV SOLN
2.0000 g | Freq: Once | INTRAVENOUS | Status: AC
  Administered 2022-04-07: 2 g via INTRAVENOUS
  Filled 2022-04-07: qty 50

## 2022-04-07 MED ORDER — ONDANSETRON HCL 4 MG/2ML IJ SOLN: 4.0000 mg | Freq: Four times a day (QID) | INTRAMUSCULAR | Status: DC | PRN

## 2022-04-07 MED ORDER — IODIXANOL 320 MG/ML IV SOLN
INTRAVENOUS | Status: DC | PRN
  Administered 2022-04-07: 50 mL

## 2022-04-07 MED ORDER — LISINOPRIL 20 MG PO TABS: 20.0000 mg | ORAL_TABLET | Freq: Every day | ORAL | Status: DC

## 2022-04-07 MED ORDER — CEFAZOLIN SODIUM-DEXTROSE 2-4 GM/100ML-% IV SOLN: 2.0000 g | INTRAVENOUS | Status: AC

## 2022-04-07 MED ORDER — GUAIFENESIN-DM 100-10 MG/5ML PO SYRP: 15.0000 mL | ORAL_SOLUTION | ORAL | Status: DC | PRN

## 2022-04-07 MED ORDER — ALUM & MAG HYDROXIDE-SIMETH 200-200-20 MG/5ML PO SUSP: 15.0000 mL | ORAL | Status: DC | PRN

## 2022-04-07 MED ORDER — MIDAZOLAM HCL 2 MG/ML PO SYRP: 8.0000 mg | ORAL_SOLUTION | Freq: Once | ORAL | Status: DC | PRN

## 2022-04-07 MED ORDER — MIDAZOLAM HCL 2 MG/2ML IJ SOLN
INTRAMUSCULAR | Status: AC
  Filled 2022-04-07: qty 4

## 2022-04-07 MED ORDER — CEFAZOLIN SODIUM-DEXTROSE 2-4 GM/100ML-% IV SOLN
2.0000 g | Freq: Three times a day (TID) | INTRAVENOUS | Status: AC
  Administered 2022-04-07 – 2022-04-08 (×2): 2 g via INTRAVENOUS
  Filled 2022-04-07 (×3): qty 100

## 2022-04-07 MED ORDER — HEPARIN SODIUM (PORCINE) 1000 UNIT/ML IJ SOLN
INTRAMUSCULAR | Status: AC
  Filled 2022-04-07: qty 10

## 2022-04-07 MED ORDER — ACETAMINOPHEN 325 MG RE SUPP: 325.0000 mg | RECTAL | Status: DC | PRN

## 2022-04-07 MED ORDER — HEPARIN SODIUM (PORCINE) 1000 UNIT/ML IJ SOLN
INTRAMUSCULAR | Status: DC | PRN
  Administered 2022-04-07: 10000 [IU] via INTRAVENOUS

## 2022-04-07 MED ORDER — POTASSIUM CHLORIDE IN NACL 20-0.9 MEQ/L-% IV SOLN
INTRAVENOUS | Status: DC
  Filled 2022-04-07 (×2): qty 1000

## 2022-04-07 MED ORDER — LABETALOL HCL 5 MG/ML IV SOLN
INTRAVENOUS | Status: AC
  Filled 2022-04-07: qty 4

## 2022-04-07 MED ORDER — METHYLPREDNISOLONE SODIUM SUCC 125 MG IJ SOLR: 125.0000 mg | Freq: Once | INTRAMUSCULAR | Status: DC | PRN

## 2022-04-07 MED ORDER — DIPHENHYDRAMINE HCL 50 MG/ML IJ SOLN: 50.0000 mg | Freq: Once | INTRAMUSCULAR | Status: DC | PRN

## 2022-04-07 MED ORDER — FENTANYL CITRATE PF 50 MCG/ML IJ SOSY: 12.5000 ug | PREFILLED_SYRINGE | Freq: Once | INTRAMUSCULAR | Status: DC | PRN

## 2022-04-07 MED ORDER — SODIUM CHLORIDE 0.9 % IV SOLN
500.0000 mL | Freq: Once | INTRAVENOUS | Status: AC | PRN
  Administered 2022-04-07: 500 mL via INTRAVENOUS

## 2022-04-07 MED ORDER — MIDAZOLAM HCL 2 MG/2ML IJ SOLN
INTRAMUSCULAR | Status: DC | PRN
  Administered 2022-04-07: .5 mg via INTRAVENOUS
  Administered 2022-04-07: 2 mg via INTRAVENOUS

## 2022-04-07 MED ORDER — FENTANYL CITRATE PF 50 MCG/ML IJ SOSY
PREFILLED_SYRINGE | INTRAMUSCULAR | Status: AC
  Filled 2022-04-07: qty 2

## 2022-04-07 MED ORDER — OXYCODONE HCL 5 MG PO TABS: 5.0000 mg | ORAL_TABLET | ORAL | Status: DC | PRN

## 2022-04-07 MED ORDER — ASPIRIN 81 MG PO TBEC
81.0000 mg | DELAYED_RELEASE_TABLET | Freq: Every day | ORAL | Status: DC
  Administered 2022-04-08: 81 mg via ORAL
  Filled 2022-04-07: qty 1

## 2022-04-07 MED ORDER — FENTANYL CITRATE (PF) 100 MCG/2ML IJ SOLN
INTRAMUSCULAR | Status: DC | PRN
  Administered 2022-04-07: 25 ug via INTRAVENOUS
  Administered 2022-04-07: 50 ug via INTRAVENOUS

## 2022-04-07 MED ORDER — HYDROMORPHONE HCL 1 MG/ML IJ SOLN: 1.0000 mg | Freq: Once | INTRAMUSCULAR | Status: DC | PRN

## 2022-04-07 MED ORDER — FAMOTIDINE 20 MG PO TABS: 40.0000 mg | ORAL_TABLET | Freq: Once | ORAL | Status: DC | PRN

## 2022-04-07 MED ORDER — HYDRALAZINE HCL 20 MG/ML IJ SOLN
5.0000 mg | INTRAMUSCULAR | Status: AC | PRN
  Administered 2022-04-07: 5 mg via INTRAVENOUS

## 2022-04-07 MED ORDER — METOPROLOL TARTRATE 5 MG/5ML IV SOLN: 2.0000 mg | INTRAVENOUS | Status: DC | PRN

## 2022-04-07 MED ORDER — PHENOL 1.4 % MT LIQD: 1.0000 | OROMUCOSAL | Status: DC | PRN

## 2022-04-07 MED ORDER — HYDRALAZINE HCL 20 MG/ML IJ SOLN
INTRAMUSCULAR | Status: AC
  Administered 2022-04-07: 5 mg via INTRAVENOUS
  Filled 2022-04-07: qty 1

## 2022-04-07 MED ORDER — MORPHINE SULFATE (PF) 4 MG/ML IV SOLN: 2.0000 mg | INTRAVENOUS | Status: DC | PRN

## 2022-04-07 MED ORDER — LABETALOL HCL 5 MG/ML IV SOLN
10.0000 mg | INTRAVENOUS | Status: DC | PRN
  Administered 2022-04-07: 10 mg via INTRAVENOUS
  Filled 2022-04-07: qty 4

## 2022-04-07 MED ORDER — PANTOPRAZOLE SODIUM 40 MG IV SOLR
40.0000 mg | Freq: Every day | INTRAVENOUS | Status: DC
  Administered 2022-04-07: 40 mg via INTRAVENOUS
  Filled 2022-04-07: qty 10

## 2022-04-07 MED ORDER — DOCUSATE SODIUM 100 MG PO CAPS
100.0000 mg | ORAL_CAPSULE | Freq: Every day | ORAL | Status: DC
  Administered 2022-04-08: 100 mg via ORAL
  Filled 2022-04-07: qty 1

## 2022-04-07 MED ORDER — CHLORHEXIDINE GLUCONATE CLOTH 2 % EX PADS
6.0000 | MEDICATED_PAD | Freq: Once | CUTANEOUS | Status: AC
  Administered 2022-04-07: 6 via TOPICAL

## 2022-04-07 MED ORDER — PHENYLEPHRINE 80 MCG/ML (10ML) SYRINGE FOR IV PUSH (FOR BLOOD PRESSURE SUPPORT)
PREFILLED_SYRINGE | INTRAVENOUS | Status: AC
  Filled 2022-04-07: qty 10

## 2022-04-07 MED ORDER — CEFAZOLIN SODIUM-DEXTROSE 2-4 GM/100ML-% IV SOLN
INTRAVENOUS | Status: AC
  Administered 2022-04-07: 2 g via INTRAVENOUS
  Filled 2022-04-07: qty 100

## 2022-04-07 MED ORDER — ATROPINE SULFATE 1 MG/ML IV SOLN
INTRAVENOUS | Status: DC | PRN
  Administered 2022-04-07: 1 mg via INTRAVENOUS

## 2022-04-07 SURGICAL SUPPLY — 24 items
BALLN VTRAC 4.5X30X135 (BALLOONS) ×2
BALLOON VTRAC 4.5X30X135 (BALLOONS) ×1 IMPLANT
CATH ANGIO 5F PIGTAIL 100CM (CATHETERS) ×1 IMPLANT
CATH BEACON 5 .035 100 H1 TIP (CATHETERS) ×1 IMPLANT
COVER DRAPE FLUORO 36X44 (DRAPES) ×2 IMPLANT
COVER PROBE ULTRASOUND 5X96 (MISCELLANEOUS) ×1 IMPLANT
DEVICE EMBOSHIELD NAV6 4.0-7.0 (FILTER) ×1 IMPLANT
DEVICE SAFEGUARD 24CM (GAUZE/BANDAGES/DRESSINGS) ×1 IMPLANT
DEVICE STARCLOSE SE CLOSURE (Vascular Products) ×1 IMPLANT
DEVICE TORQUE (MISCELLANEOUS) ×1 IMPLANT
GLIDEWIRE ANGLED SS 035X260CM (WIRE) ×2 IMPLANT
GUIDEWIRE VASC STIFF .038X260 (WIRE) ×1 IMPLANT
KIT ENCORE 26 ADVANTAGE (KITS) ×1 IMPLANT
KIT MANI 3VAL PERCEP (MISCELLANEOUS) ×1 IMPLANT
NDL ENTRY 21GA 7CM ECHOTIP (NEEDLE) IMPLANT
NEEDLE ENTRY 21GA 7CM ECHOTIP (NEEDLE) ×2 IMPLANT
PACK ANGIOGRAPHY (CUSTOM PROCEDURE TRAY) ×2 IMPLANT
SET INTRO CAPELLA COAXIAL (SET/KITS/TRAYS/PACK) ×1 IMPLANT
SHEATH BRITE TIP 6FRX11 (SHEATH) ×2 IMPLANT
SHEATH NEURON MAX 6FR 80CM (SHEATH) ×1 IMPLANT
STENT XACT CAR 9-7X30X136 (Permanent Stent) ×1 IMPLANT
SUT MNCRL AB 4-0 PS2 18 (SUTURE) ×1 IMPLANT
TUBING CONTRAST HIGH PRESS 72 (TUBING) ×1 IMPLANT
WIRE GUIDERIGHT .035X150 (WIRE) ×1 IMPLANT

## 2022-04-07 NOTE — Interval H&P Note (Signed)
History and Physical Interval Note:  04/07/2022 3:18 PM  Antonio [name]  has presented today for surgery, with the diagnosis of Carotid Stenosis.  The various methods of treatment have been discussed with the patient and family. After consideration of risks, benefits and other options for treatment, the patient has consented to  Procedure(s): CAROTID PTA/STENT INTERVENTION (Left) as a surgical intervention.  The patient's history has been reviewed, patient examined, no change in status, stable for surgery.  I have reviewed the patient's chart and labs.  Questions were answered to the patient's satisfaction.     Hortencia Pilar

## 2022-04-07 NOTE — Progress Notes (Signed)
2000- no orders in place for safeguard pressure assistive device. Site assessed with 2000 assessment, no notable changes per report from previous RN. Agricultural consultant notified.

## 2022-04-07 NOTE — Op Note (Signed)
OPERATIVE NOTE DATE: 04/07/2022  PROCEDURE:  Ultrasound guidance for vascular access right femoral artery  Placement of a 9 mm x 7 mm x 30 mm exact stent with the use of the NAV-6 embolic protection device in the right internal carotid artery  PRE-OPERATIVE DIAGNOSIS: 1.  Greater than 90% stenosis of the right internal carotid artery. 2.  Right hemispheric CVA  POST-OPERATIVE DIAGNOSIS:  Same as above  SURGEON: Hortencia Pilar  ASSISTANT(S): None  ANESTHESIA: local/MCS  ESTIMATED BLOOD LOSS: 100 cc  CONTRAST: 50 cc  FLUORO TIME: 9 minutes  MODERATE CONSCIOUS SEDATION TIME: Continuous ECG pulse oximetry and cardiopulmonary monitoring was performed throughout the entire procedure by the interventional radiology nurse total sedation time was 54 minutes and 23 seconds  FINDING(S): 1.   90% stenosis of the right internal carotid artery stenosis  SPECIMEN(S):   none  INDICATIONS:   Patient is a 77 y.o. male who presents with symptomatic right internal carotid artery stenosis.  The patient has significant comorbidities including severe coronary artery disease and heart block and carotid artery stenting was felt to be preferred to endarterectomy for that reason.  Risks and benefits were discussed and informed consent was obtained.   DESCRIPTION: After obtaining full informed written consent, the patient was brought back to the vascular suite and placed supine upon the table.  The patient received IV antibiotics prior to induction. Moderate conscious sedation was administered during a face to face encounter with the patient throughout the procedure with my supervision of the RN administering medicines and monitoring the patients vital signs and mental status throughout from the start of the procedure until the patient was taken to the recovery room.    After obtaining adequate sedation, the patient was prepped and draped in the standard fashion.    A first assistant is required in  order to allow for a safe and more efficient operation.  Duties include wire manipulations as well as assistance with pinning the sheath and positioning the detector for proper angle, assistance and deploying the stent in the proper position and appropriate images.  Further duties include assisting with patient positioning during the procedure.  I believe that this procedure requires a first assistant in order for it to be performed at a level in keeping with the high standards of this institution.  The right femoral artery was visualized with ultrasound and found to be widely patent. It was then accessed under direct ultrasound guidance without difficulty with a micropuncture needle. A permanent image was recorded.  A microwire was then advanced without difficulty under fluoroscopic guidance followed by a micro-sheath.  A J-wire was placed and we then placed a 6 French sheath. The patient was then heparinized and a total of 10,000 units of intravenous heparin were given and an ACT was checked to confirm successful anticoagulation.   A pigtail catheter was then placed into the ascending aorta. This showed a type I arch no hemodynamically significant stenosis of the ostia of the great vessels.  He appears to have a bovine anatomy. The innominate and then the right common carotid artery was then selectively cannulated without difficulty with a H1 catheter and the catheter advanced into the mid right common carotid artery.  Cervical and cerebral carotid angiography was then performed. There were no obvious intracranial filling defects. The carotid bifurcation demonstrated greater than 90% stenosis right at the bifurcation extending into the internal carotid artery.  I then advanced into the external carotid artery with a Glidewire and  the H1 catheter and then exchanged for the Amplatz Super Stiff wire. Over the Amplatz Super Stiff wire, a 6 [place] shuttle sheath was placed into the mid common carotid artery. I then  used the NAV-6  Embolic protection device and crossed the lesion and parked this in the distal internal carotid artery at the base of the skull.  I then selected a 9 x 7 x 30 exact stent. This was deployed across the lesion encompassing it in its entirety. A 4.5 x 30 length balloon was used to post dilate the stent. Only about a 15% residual stenosis was present after angioplasty. Completion angiogram showed normal intracranial filling without new defects. At this point I elected to terminate the procedure. The sheath was removed and StarClose closure device was deployed in the right femoral artery with excellent hemostatic result. The patient was taken to the recovery room in stable condition having tolerated the procedure well.  COMPLICATIONS: none  CONDITION: stable  Hortencia Pilar 04/07/2022 4:26 PM   This note was created with Dragon Medical transcription system. Any errors in dictation are purely unintentional.

## 2022-04-07 NOTE — Progress Notes (Signed)
EEG was negative. I think TIA is a significant possibility still, and even if there ewas some other cause for his symptoms, with previous stroke and right carotid stenosis, supect it is stenotic. He is getting a stent today.   Neurology will be available on an as needed basis moving forward, please call with further questions or concerns.   Roland Rack, MD [facility] [phone]  If 7pm- 7am, please page neurology on call as listed in [place].

## 2022-04-07 NOTE — Consult Note (Signed)
ANTICOAGULATION CONSULT NOTE - Initial Consult  Pharmacy Consult for heparin drip Indication: Possible TIA and carotid vascular procedure planned for 04/07/22 No Known Allergies  Patient Measurements: Height: '5\' 6"'$  (167.6 cm) Weight: 80.7 kg (177 lb 14.6 oz) IBW/kg (Calculated) : 63.8 Heparin Dosing Weight: 80 kg  Vital Signs: Temp: 98.5 F (36.9 C) (03/01 0422) Temp Source: Oral (03/01 0422) BP: 161/77 (03/01 0422) Pulse Rate: 69 (03/01 0422)  Labs: Recent Labs    04/05/22 0839 04/05/22 1504 04/06/22 0320 04/06/22 1221 04/06/22 2021 04/07/22 0407  HGB 13.0 14.1 12.8*  --   --  11.7*  HCT 40.1 43.1 38.5*  --   --  36.3*  PLT 309 345 314  --   --  266  APTT 30  --   --  30  --   --   LABPROT 13.6  --   --  13.5  --   --   INR 1.1  --   --  1.0  --   --   HEPARINUNFRC  --   --   --   --  0.17* 0.44  CREATININE 0.90 1.12 1.46*  --   --  1.17  TROPONINIHS 22*  18*  --   --   --   --   --      Estimated Creatinine Clearance: 52.8 mL/min (by C-G formula based on SCr of 1.17 mg/dL).   Medical History: Past Medical History:  Diagnosis Date   Anemia    Arthritis    Asthma    age 22   BPH with urinary obstruction    stable on flomax ([name]   Coronary atherosclerosis of unspecified type of vessel, native or graft    Diabetes mellitus    Type II   Emphysema of lung (HCC)    Esophageal stricture    Gastritis    GERD (gastroesophageal reflux disease)    Hiatal hernia    Hx of colonic polyps    Hyperlipidemia    Hypertension    Internal hemorrhoids without mention of complication    Lumbago    Pain in both lower legs    Pneumonia    PSA elevation    now averaging 2's   Tobacco abuse    URI (upper respiratory infection)    Urinary retention     Medications:  No home anticoagulation per pharmacist review  Assessment: 77 yo male with history of CVA presented to ED with headache and symptoms concerning for stroke.  MRI of brain did not show acute infarct.   Patient was found to have carotid artery stenosis.  Vascular procedure planned for 04/07/22.  Pharmacy consulted to initiate heparin drip.  Baseline labs: hgb 12.8, plt = 314, aPTT pending, PT/INR pending  Goal of Therapy:  Heparin level 0.3-0.7 units/ml -- Vascular wishes to target standard therapeutic range Monitor platelets by anticoagulation protocol: Yes   Plan:  03/01 @ 0407 = 0.44, therapeutic X 1 Will continue pt on current rate and recheck HL in 8 hrs on 3/1 @ 1200.  ----Daily CBC while on heparin  Hildred Pharo D, PharmD 04/07/2022,5:45 AM

## 2022-04-07 NOTE — Progress Notes (Signed)
[facility] Progress Note  Patient: Antonio [name]    [id number]  DOA: 04/05/2022     Date of Service: the patient was seen and examined on 04/07/2022  Chief Complaint  Patient presents with   Abdominal Pain   Chest Pain   Brief hospital course: Antonio [name] is a 77 y.o. male with medical history significant of multiple medical issues including type 2 diabetes, BPH, coronary artery disease, hypertension, GERD, hyperlipidemia presenting with encephalopathy, UTI, hyperglycemia.  Limited history in the setting of encephalopathy//lethargy.  As per H&P patient presented with left-sided weakness prior to presentation, headache, nausea and vomiting. ED workup, code stroke workup was negative.  Patient was found to have UTI, carotid stenosis.  MRI negative.  Neurology was consulted and patient was admitted under hospital service for further management as below.   Assessment and Plan:   # Acute metabolic encephalopathy, CVA ruled out Continue neurocheck as per protocol Continue to monitor on telemetry Neurology consulted, recommended EEG and 2D echocardiogram to rule out intracardiac source blood clot EEG: Borderline slow PDR, may represent mild encephalopathy, no seizures recorded. TTE LVEF 65% no WMA, LV hypertrophy grade 1 diastolic dysfunction.  No PFO   # UTI, UA positive Continue ceftriaxone 2 g IV daily Follow urine culture growing GNR, follow complete report   # AKI, Baseline creatinine 0.9 Creatinine 1.46 elevated could be secondary to IV contrast Cr 1.17 improved s/p IV fluid Bladder scan negative for urine retention Monitor renal functions Avoid nephrotoxic medications and use renally dose medications   # Carotid stenosis, bilateral CTA head and neck w/ 80% stenosis on L and 70%stenosis on R  Vascular surgery consulted, patient is scheduled for carotid artery stent insertion today Patient was started on heparin IV infusion Patient is n.p.o. 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   continue home aspirin, Plavix and statin LDL 48, triglyceride 243 Neurology consulted, recommended MRI brain, TTE, A1c, fasting lipid profile.  If symptoms resolved with migraine cocktail then follow-up as an outpatient.   #Hypomagnesemia, mag repleted. Monitor Electrolytes and replete as needed.   # GERD (gastroesophageal reflux disease) PPI   # Type 2 diabetes mellitus with hyperglycemia  Blood sugar in 300s on presentation without ketoacidosis HbA1c 8.8, slightly elevated. Held home medications for now Continue Levemir 16 units daily and NovoLog sliding scale Continue diabetic diet, monitor FSBG   # Essential hypertension Systolic BP in [id number] on presentation  MRI negative for acute CVA  Titrate home regimen.   # HLD (hyperlipidemia) Cont statin   # BPH, continued Proscar and Flomax home dose  # Incidental findings, left hilar tissue and mediastinal lymphadenopathy CTA neck:  Left hilar soft tissue with adjacent probable post treatment change in the left upper lobe. Also, significant interval increase in left paratracheal 3.2 by 1.6 cm lymph node. A follow-up CT of the chest (preferably with contrast) is recommended to better assess for disease progression. Patient already received IV contrast so CT chest was not done, creatinine is slightly elevated.  Patient was advised to follow with PCP for CT chest as an outpatient and further workup.   Body mass index is 28.72 kg/m.  Interventions:      Diet: Carb modified diet DVT Prophylaxis: Heparin IV infusion as per Vasc sx  Advance goals of care discussion: Full code  Family Communication: family was present at bedside, at the time of interview.  The pt provided permission to discuss medical plan with the family. Opportunity was given to ask question and all questions were answered satisfactorily.    Disposition:  Pt is from Home, admitted with AMS, UTI,  Ucx pending, still on IV Abx, need carotid stent, which precludes a safe discharge. Discharge to Home, when clinically stable, most likely tomorrow a.m..  Subjective: No significant events overnight, patient denies any complaints, resting comfortably, no chest pain or palpitation, no headache, no shortness of breath. Patient was n.p.o. for carotid stent placement.   Physical Exam: General: NAD, lying comfortably Appear in no distress, affect appropriate Eyes: PERRLA ENT: Oral Mucosa Clear, moist  Neck: no JVD,  Cardiovascular: S1 and S2 Present, no Murmur,  Respiratory: good respiratory effort, Bilateral Air entry equal and Decreased, no Crackles, no wheezes Abdomen: Bowel Sound present, Soft and no tenderness,  Skin: no rashes Extremities: no Pedal edema, no calf tenderness Neurologic: without any new focal findings Gait not checked due to patient safety concerns  Vitals:   2022-04-17 1916 04/07/22 0422 04/07/22 0932 04/07/22 1040  BP: (!) 147/79 (!) 161/77 (!) 189/66 (!) 174/71  Pulse: 79 69 (!) 55 64  Resp: '18 19 16   '$ Temp: 98.3 F (36.8 C) 98.5 F (36.9 C)    TempSrc:  Oral    SpO2:  94% 96%   Weight:      Height:        Intake/Output Summary (Last 24 hours) at 04/07/2022 1259 Last data filed at 04/07/2022 1207 Gross per 24 hour  Intake 2419.45 ml  Output 1325 ml  Net 1094.45 ml   Filed Weights   04/05/22 0744  Weight: 80.7 kg    Data Reviewed: I have personally reviewed and interpreted daily labs, tele strips, imagings as discussed above. I reviewed all nursing notes, pharmacy notes, vitals, pertinent old records I have discussed plan of care as described above with RN and patient/family.  CBC: Recent Labs  Lab 04/05/22 0839 04/05/22 1504 April 17, 2022 0320 04/07/22 0407  WBC 13.3* 12.1* 11.7* 7.3  NEUTROABS 12.6*  --   --   --   HGB 13.0 14.1 12.8* 11.7*  HCT 40.1 43.1 38.5* 36.3*  MCV 87.9 87.2  87.5 87.9  PLT 309 345 314 [id number]   Basic Metabolic Panel: Recent Labs  Lab 04/05/22 0839 04/05/22 1504 2022-04-17 0320 04/07/22 0407  NA 132*  --  139 140  K 4.4  --  3.5 3.5  CL 98  --  104 107  CO2 23  --  26 24  GLUCOSE 378*  --  143* 108*  BUN 19  --  31* 33*  CREATININE 0.90 1.12 1.46* 1.17  CALCIUM 8.3*  --  8.3* 8.1*  MG  --   --   --  1.5*  PHOS  --   --   --  3.5    Studies: EEG adult  Result Date: April 17, 2022 Greta Doom, MD     04/17/22  4:02 PM History: 77 year old male who presented with transient left-sided symptoms Sedation: None Technique: This EEG was acquired with electrodes placed according to the International 10-20 electrode system (including Fp1, Fp2, F3, F4, C3, C4, P3, P4, O1, O2, T3, T4, T5, T6, A1, A2, Fz, Cz, Pz). The following electrodes were missing or displaced: none. Background: The background consists of intermixed alpha and beta activities. There is a well defined posterior dominant rhythm of seven to eight hz that attenuates with eye opening.  At times there is  suggestion of asymmetric posterior dominant rhythm with attenuation on the right, but this does not usually meet the greater than 50% requirement for asymmetry.  There is an increase in slow activity associated with Photic stimulation: Physiologic driving is present EEG Abnormalities: Borderline slow PDR Clinical Interpretation: This EEG is recorded in the waking and drowsy state.  With borderline PDR, this can be seen in the elderly is a normal variant, but can also represent a mild encephalopathy.  There was no seizure or seizure predisposition recorded on this study. Please note that lack of epileptiform activity on EEG does not preclude the possibility of epilepsy. Roland Rack, MD [facility] [phone] If 7pm- 7am, please page neurology on call as listed in [place].   ECHOCARDIOGRAM COMPLETE  Result Date: 04/06/2022    ECHOCARDIOGRAM REPORT   Patient Name:   Antonio [name] Date of Exam: 04/06/2022 Medical Rec #:  [id number]     Height:       66.0 in Accession #:    [id number]    Weight:       177.9 lb Date of Birth:  1945-08-11    BSA:          1.903 m Patient Age:    4 years      BP:           110/60 mmHg Patient Gender: M             HR:           59 bpm. Exam Location:  [facility] Procedure: 2D Echo, Color Doppler and Cardiac Doppler Indications:     Stroke I63.9  History:         Patient has prior history of Echocardiogram examinations, most                  recent 05/20/2021. Risk Factors:Dyslipidemia and Hypertension.                  Emphysema of lung.  Sonographer:     Sherrie Sport Referring Phys:  [id number] Val Riles Diagnosing Phys: Ida Rogue MD IMPRESSIONS  1. Left ventricular ejection fraction, by estimation, is 60 to 65%. The left ventricle has normal function. The left ventricle has no regional wall motion abnormalities. There is moderate left ventricular hypertrophy. Left ventricular diastolic parameters are consistent with Grade I diastolic dysfunction (impaired relaxation).  2. Right ventricular systolic function is normal. The right ventricular size is normal. There is normal pulmonary artery systolic pressure. The estimated right ventricular systolic pressure is 123456 mmHg.  3. The mitral valve is normal in structure. Mild mitral valve regurgitation. No evidence of mitral stenosis.  4. The aortic valve is tricuspid. Aortic valve regurgitation is not visualized. Aortic valve sclerosis is present, with no evidence of aortic valve stenosis.  5. The inferior vena cava is normal in size with greater than 50% respiratory variability, suggesting right atrial pressure of 3 mmHg. FINDINGS  Left Ventricle: Left ventricular ejection fraction, by estimation, is 60 to 65%. The left ventricle has normal function. The left ventricle has no regional wall motion abnormalities. The left ventricular internal cavity size was normal in size. There is  moderate left ventricular  hypertrophy. Left ventricular diastolic parameters are consistent with Grade I diastolic dysfunction (impaired relaxation). Right Ventricle: The right ventricular size is normal. No increase in right ventricular wall thickness. Right ventricular systolic function is normal. There is normal pulmonary artery systolic pressure. The tricuspid regurgitant velocity is 1.78 m/s, and  with an assumed right atrial pressure of 5 mmHg, the estimated right ventricular systolic pressure is 123456 mmHg. Left Atrium: Left atrial size was normal in size. Right Atrium: Right atrial size was normal in size. Pericardium: There is no evidence of pericardial effusion. Mitral Valve: The mitral valve is normal in structure. There is mild calcification of the mitral valve leaflet(s). Mild mitral valve regurgitation. No evidence of mitral valve stenosis. Tricuspid Valve: The tricuspid valve is normal in structure. Tricuspid valve regurgitation is mild . No evidence of tricuspid stenosis. Aortic Valve: The aortic valve is tricuspid. Aortic valve regurgitation is not visualized. Aortic valve sclerosis is present, with no evidence of aortic valve stenosis. Aortic valve mean gradient measures 2.0 mmHg. Aortic valve peak gradient measures 3.5  mmHg. Aortic valve area, by VTI measures 3.14 cm. Pulmonic Valve: The pulmonic valve was normal in structure. Pulmonic valve regurgitation is not visualized. No evidence of pulmonic stenosis. Aorta: The aortic root is normal in size and structure. Venous: The inferior vena cava is normal in size with greater than 50% respiratory variability, suggesting right atrial pressure of 3 mmHg. IAS/Shunts: No atrial level shunt detected by color flow Doppler.  LEFT VENTRICLE PLAX 2D LVIDd:         4.10 cm   Diastology LVIDs:         2.60 cm   LV e' medial:    5.66 cm/s LV PW:         1.60 cm   LV E/e' medial:  14.0 LV IVS:        1.60 cm   LV e' lateral:   5.77 cm/s LVOT diam:     2.00 cm   LV E/e' lateral: 13.7 LV  SV:         46 LV SV Index:   24 LVOT Area:     3.14 cm  RIGHT VENTRICLE RV Basal diam:  2.30 cm RV Mid diam:    2.00 cm RV S prime:     12.30 cm/s TAPSE (M-mode): 2.4 cm LEFT ATRIUM             Index        RIGHT ATRIUM          Index LA diam:        2.80 cm 1.47 cm/m   RA Area:     8.73 cm LA Vol (A2C):   40.1 ml 21.07 ml/m  RA Volume:   14.00 ml 7.36 ml/m LA Vol (A4C):   48.3 ml 25.38 ml/m LA Biplane Vol: 46.0 ml 24.18 ml/m  AORTIC VALVE AV Area (Vmax):    2.42 cm AV Area (Vmean):   3.00 cm AV Area (VTI):     3.14 cm AV Vmax:           93.30 cm/s AV Vmean:          60.800 cm/s AV VTI:            0.148 m AV Peak Grad:      3.5 mmHg AV Mean Grad:      2.0 mmHg LVOT Vmax:         71.90 cm/s LVOT Vmean:        58.000 cm/s LVOT VTI:          0.148 m LVOT/AV VTI ratio: 1.00  AORTA Ao Root diam: 3.10 cm MITRAL VALVE               TRICUSPID VALVE MV Area (PHT): 3.21  cm    TR Peak grad:   12.7 mmHg MV Decel Time: 236 msec    TR Vmax:        178.00 cm/s MV E velocity: 79.10 cm/s MV A velocity: 94.90 cm/s  SHUNTS MV E/A ratio:  0.83        Systemic VTI:  0.15 m                            Systemic Diam: 2.00 cm Ida Rogue MD Electronically signed by Ida Rogue MD Signature Date/Time: 04/06/2022/2:51:36 PM    Final     Scheduled Meds:  amLODipine  5 mg Oral Daily   aspirin EC  81 mg Oral q AM   atorvastatin  40 mg Oral QPM   clopidogrel  75 mg Oral Daily   finasteride  5 mg Oral Daily   insulin aspart  0-15 Units Subcutaneous Q4H   insulin detemir  0.2 Units/kg Subcutaneous Daily   lisinopril  20 mg Oral Daily   pantoprazole  40 mg Oral Daily   tamsulosin  0.4 mg Oral BID   Continuous Infusions:  cefTRIAXone (ROCEPHIN)  IV 2 g (04/07/22 1205)   heparin 1,250 Units/hr (04/07/22 0856)   PRN Meds: acetaminophen, hydrALAZINE, hydrALAZINE, labetalol, ondansetron **OR** ondansetron (ZOFRAN) IV  Time spent: 35 minutes  Author: Val Riles. MD [facility] 04/07/2022 12:59 PM  To reach  On-call, see care teams to locate the attending and reach out to them via [email or web address]. If 7PM-7AM, please contact night-coverage If you still have difficulty reaching the attending provider, please page the [facility] (Director on Call) for [facility] on amion for assistance.

## 2022-04-07 NOTE — Consult Note (Signed)
ANTICOAGULATION CONSULT NOTE - Initial Consult  Pharmacy Consult for heparin drip Indication: Possible TIA and carotid vascular procedure planned for 04/07/22 No Known Allergies  Patient Measurements: Height: '5\' 6"'$  (167.6 cm) Weight: 80.7 kg (177 lb 14.6 oz) IBW/kg (Calculated) : 63.8 Heparin Dosing Weight: 80 kg  Vital Signs: Temp: 98.5 F (36.9 C) (03/01 0422) Temp Source: Oral (03/01 0422) BP: 174/71 (03/01 1040) Pulse Rate: 64 (03/01 1040)  Labs: Recent Labs    04/05/22 0839 04/05/22 1504 04/06/22 0320 04/06/22 1221 04/06/22 2021 04/07/22 0407 04/07/22 1157  HGB 13.0 14.1 12.8*  --   --  11.7*  --   HCT 40.1 43.1 38.5*  --   --  36.3*  --   PLT 309 345 314  --   --  266  --   APTT 30  --   --  30  --   --   --   LABPROT 13.6  --   --  13.5  --   --   --   INR 1.1  --   --  1.0  --   --   --   HEPARINUNFRC  --   --   --   --  0.17* 0.44 0.48  CREATININE 0.90 1.12 1.46*  --   --  1.17  --   TROPONINIHS 22*  18*  --   --   --   --   --   --      Estimated Creatinine Clearance: 52.8 mL/min (by C-G formula based on SCr of 1.17 mg/dL).   Medical History: Past Medical History:  Diagnosis Date   Anemia    Arthritis    Asthma    age 68   BPH with urinary obstruction    stable on flomax ([name]   Coronary atherosclerosis of unspecified type of vessel, native or graft    Diabetes mellitus    Type II   Emphysema of lung (HCC)    Esophageal stricture    Gastritis    GERD (gastroesophageal reflux disease)    Hiatal hernia    Hx of colonic polyps    Hyperlipidemia    Hypertension    Internal hemorrhoids without mention of complication    Lumbago    Pain in both lower legs    Pneumonia    PSA elevation    now averaging 2's   Tobacco abuse    URI (upper respiratory infection)    Urinary retention     Medications:  No home anticoagulation per pharmacist review  Assessment: 77 yo male with history of CVA presented to ED with headache and symptoms  concerning for stroke.  MRI of brain did not show acute infarct.  Patient was found to have carotid artery stenosis.  Vascular procedure planned for 04/07/22.  Pharmacy consulted to initiate heparin drip.  Baseline labs: hgb 12.8, plt = 314, aPTT pending, PT/INR pending  Goal of Therapy:  Heparin level 0.3-0.7 units/ml -- Vascular wishes to target standard therapeutic range Monitor platelets by anticoagulation protocol: Yes   Plan:  03/01 @ 1157 HL = 0.48, therapeutic x 2 --continue heparin drip at 1250 units/hr --recheck HL 3/2 with AM labs --Daily CBC while on heparin  Lorin Picket, PharmD 04/07/2022,12:21 PM

## 2022-04-08 DIAGNOSIS — G9340 Encephalopathy, unspecified: Secondary | ICD-10-CM | POA: Diagnosis not present

## 2022-04-08 LAB — CBC
HCT: 36.3 % — ABNORMAL LOW (ref 39.0–52.0)
Hemoglobin: 12 g/dL — ABNORMAL LOW (ref 13.0–17.0)
MCH: 28.8 pg (ref 26.0–34.0)
MCHC: 33.1 g/dL (ref 30.0–36.0)
MCV: 87.1 fL (ref 80.0–100.0)
Platelets: 272 10*3/uL (ref 150–400)
RBC: 4.17 MIL/uL — ABNORMAL LOW (ref 4.22–5.81)
RDW: 13.5 % (ref 11.5–15.5)
WBC: 7.4 10*3/uL (ref 4.0–10.5)
nRBC: 0 % (ref 0.0–0.2)

## 2022-04-08 LAB — URINE CULTURE: Culture: >=100000 — AB

## 2022-04-08 LAB — BASIC METABOLIC PANEL
Anion gap: 9 (ref 5–15)
BUN: 17 mg/dL (ref 8–23)
CO2: 22 mmol/L (ref 22–32)
Calcium: 8.2 mg/dL — ABNORMAL LOW (ref 8.9–10.3)
Chloride: 107 mmol/L (ref 98–111)
Creatinine, Ser: 0.83 mg/dL (ref 0.61–1.24)
GFR, Estimated: >60 mL/min (ref >60)
Glucose, Bld: 140 mg/dL — ABNORMAL HIGH (ref 70–99)
Potassium: 3.5 mmol/L (ref 3.5–5.1)
Sodium: 138 mmol/L (ref 135–145)

## 2022-04-08 LAB — GLUCOSE, CAPILLARY
Glucose-Capillary: 145 mg/dL — ABNORMAL HIGH (ref 70–99)
Glucose-Capillary: 189 mg/dL — ABNORMAL HIGH (ref 70–99)
Glucose-Capillary: 290 mg/dL — ABNORMAL HIGH (ref 70–99)

## 2022-04-08 LAB — MAGNESIUM: Magnesium: 1.8 mg/dL (ref 1.7–2.4)

## 2022-04-08 LAB — PHOSPHORUS: Phosphorus: 3.1 mg/dL (ref 2.5–4.6)

## 2022-04-08 LAB — MRSA NEXT GEN BY PCR, NASAL: MRSA by PCR Next Gen: DETECTED — AB

## 2022-04-08 MED ORDER — CIPROFLOXACIN HCL 500 MG PO TABS: 500.0000 mg | ORAL_TABLET | Freq: Two times a day (BID) | ORAL | 0 refills | Status: AC

## 2022-04-08 MED ORDER — HYDRALAZINE HCL 20 MG/ML IJ SOLN
10.0000 mg | INTRAMUSCULAR | Status: DC | PRN
  Filled 2022-04-08: qty 1

## 2022-04-08 MED ORDER — LISINOPRIL 20 MG PO TABS: 20.0000 mg | ORAL_TABLET | Freq: Every day | ORAL | 2 refills | Status: DC

## 2022-04-08 MED ORDER — CIPROFLOXACIN HCL 500 MG PO TABS
500.0000 mg | ORAL_TABLET | Freq: Once | ORAL | Status: AC
  Administered 2022-04-08: 500 mg via ORAL
  Filled 2022-04-08: qty 1

## 2022-04-08 MED ORDER — LISINOPRIL 5 MG PO TABS
20.0000 mg | ORAL_TABLET | Freq: Every day | ORAL | Status: DC
  Administered 2022-04-08: 20 mg via ORAL
  Filled 2022-04-08: qty 4

## 2022-04-08 MED ORDER — HYDRALAZINE HCL 50 MG PO TABS: 50.0000 mg | ORAL_TABLET | Freq: Three times a day (TID) | ORAL | 0 refills | Status: DC | PRN

## 2022-04-08 MED ORDER — HYDRALAZINE HCL 20 MG/ML IJ SOLN
10.0000 mg | Freq: Four times a day (QID) | INTRAMUSCULAR | Status: DC | PRN
  Administered 2022-04-08: 10 mg via INTRAVENOUS
  Filled 2022-04-08: qty 1

## 2022-04-08 NOTE — Progress Notes (Signed)
In and out cath complete. Gabi Rn was assisting. Pt tolerated well

## 2022-04-08 NOTE — Progress Notes (Signed)
Pts d/c instructions reviewed with pt. Pt and family have no questions. Pts v/s are stable and pt has no c/o pain or discomfort noted or stated.

## 2022-04-08 NOTE — Discharge Summary (Signed)
[facility] Discharge Summary   Patient: FRANCESCO MILLEDGE [id number]  PCP: Pleas Koch, NP  Date of admission: 04/05/2022   Date of discharge:  04/08/2022     Discharge Diagnoses:  Principal Problem:   Encephalopathy Active Problems:   HLD (hyperlipidemia)   Essential hypertension   Type 2 diabetes mellitus with hyperglycemia (HCC)   GERD (gastroesophageal reflux disease)   UTI (urinary tract infection)   History of CVA (cerebrovascular accident)   Bilateral carotid artery stenosis   Stroke-like symptoms   Admitted From: Home Disposition:  Home  Recommendations for Outpatient Follow-up:  Follow with PCP in 1 week, monitor BP at home and follow-up with PCP to titrate medications accordingly.  Prescribed hydralazine 50 mg p.o. 3 times daily as needed if systolic BP greater than Q000111Q mmHg. Follow-up with vascular surgery in 1 to 2 weeks Follow-up with urology for BPH and urinary tension which is chronic, patient does In-N-Out catheter at home. Follow up LABS/TEST:     Diet recommendation: Carb modified diet  Activity: The patient is advised to gradually reintroduce usual activities, as tolerated  Discharge Condition: stable  Code Status: Full code   History of present illness: As per the H and P dictated on admission Hospital Course:  SATURNINO CURLING is a 77 y.o. male with medical history significant of multiple medical issues including type 2 diabetes, BPH, coronary artery disease, hypertension, GERD, hyperlipidemia presenting with encephalopathy, UTI, hyperglycemia.  Limited history in the setting of encephalopathy//lethargy.  As per H&P patient presented with left-sided weakness prior to presentation, headache, nausea and vomiting. ED workup, code stroke workup was negative.  Patient was found to have UTI, carotid stenosis.  MRI negative.  Neurology was consulted and patient was admitted under hospital service for further management as below.   Assessment and  Plan: # Acute metabolic encephalopathy, CVA ruled out. Pt is back to his baseline Neurology consulted, recommended EEG and 2D echocardiogram to rule out intracardiac source blood clot EEG: Borderline slow PDR, may represent mild encephalopathy, no seizures recorded. TTE LVEF 65% no WMA, LV hypertrophy grade 1 diastolic dysfunction.  No PFO # UTI, UA positive, s/p ceftriaxone 2 g IV daily.  Urine culture growing E. coli, sensitive to Cipro and ceftriaxone.  Patient was discharged on ciprofloxacin 40 mg p.o. twice daily for 7 days.  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  Follow-up with urology in 1 wk.  # AKI, Baseline creatinine 0.9,Creatinine 1.46 elevated could be secondary to IV contrast, Cr 1.17--0.83 improved s/p IV fluid.  Bladder scan was negative for urinary retention at that time.  After carotid stent placement patient had significant urine retention, In-N-Out catheter was done 3 times.  Patient does not want Foley catheter and would like to go home as he does In-N-Out catheter at home.  # Carotid stenosis, bilateral. CTA head and neck w/ 80% stenosis on L and 70%stenosis on R. Vascular surgery consulted, s/p Rt carotid artery stent insertion, patient tolerated procedure well.  Without any complications.  Patient was cleared by vascular surgery to discharge home and follow-up as an outpatient.   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  continue home aspirin, Plavix and statin. LDL 48, triglyceride 243. Neurology consulted, recommended MRI brain which was negative for any acute findings, TTE, A1c, fasting lipid profile.  If symptoms  resolve with migraine cocktail then follow-up as an outpatient.  Patient remained asymptomatic, denied any headache, no new neurological  complaints. #Hypomagnesemia, mag repleted.  Resolved  # GERD (gastroesophageal reflux disease), PPI # Type 2 diabetes mellitus with hyperglycemia. Blood sugar in 300s on presentation without ketoacidosis. HbA1c 8.8, slightly elevated. Held home medications for now. S/p Levemir 16 units daily and NovoLog sliding scale. Continue diabetic diet, monitor FSBG.  Resumed home dose on discharge.  Recommended to follow with PCP for further management. # Essential hypertension, Systolic BP was in [id number] on presentation.  MRI negative for acute CVA.  Discontinued hydrochlorothiazide due to AKI, resumed lisinopril 20 mg p.o. daily.  Prescribed hydralazine 50 mg p.o. 3 times daily as needed if SBP greater than 150 MAC.  Monitor BP at home and follow with PCP to titrate medications accordingly. # HLD (hyperlipidemia)\, Cont statin  # BPH, continued Proscar and Flomax home dose # Incidental findings, left hilar tissue and mediastinal lymphadenopathy CTA neck:  Left hilar soft tissue with adjacent probable post treatment change in the left upper lobe. Also, significant interval increase in left paratracheal 3.2 by 1.6 cm lymph node. A follow-up CT of the chest (preferably with contrast) is recommended to better assess for disease progression. Patient already received IV contrast so CT chest was not done, creatinine is slightly elevated.  Patient was advised to follow with PCP for CT chest as an outpatient and further workup.  Body mass index is 28.72 kg/m.  Nutrition Interventions:    Patient was ambulatory without any assistance. On the day of the discharge the patient's vitals were stable, and no other acute medical condition were reported by patient. the patient was felt safe to be discharge at Home.  Consultants: Neurology, vascular surgery Procedures: Right carotid stent insertion.  Discharge Exam: General: Appear in no distress, no Rash; Oral Mucosa Clear, moist. Cardiovascular: S1 and S2 Present, no  Murmur, Respiratory: normal respiratory effort, Bilateral Air entry present and no Crackles, no wheezes Abdomen: Bowel Sound present, Soft and no tenderness, no hernia Extremities: no Pedal edema, no calf tenderness Neurology: alert and oriented to time, place, and person affect appropriate.  Filed Weights   04/05/22 0744  Weight: 80.7 kg   Vitals:   04/08/22 0900 04/08/22 1000  BP: (!) 163/114 (!) 159/61  Pulse: (!) 49 (!) 57  Resp: (!) 22 20  Temp:    SpO2: 96% 96%    DISCHARGE MEDICATION: Allergies as of 04/08/2022   No Known Allergies      Medication List     STOP taking these medications    lisinopril-hydrochlorothiazide 20-25 MG tablet Commonly known as: ZESTORETIC       TAKE these medications    acetaminophen 500 MG tablet Commonly known as: TYLENOL Take 1,000 mg by mouth every 6 (six) hours as needed (hip pain.). Rapid release   aspirin EC 81 MG tablet Take 81 mg by mouth in the morning.   atorvastatin 20 MG tablet Commonly known as: LIPITOR TAKE 1 TABLET BY MOUTH IN THE EVENING FOR CHOLESTEROL   Basaglar KwikPen 100 UNIT/ML Inject 30 Units into the skin daily.   clopidogrel 75 MG tablet Commonly known as: PLAVIX TAKE 1 TABLET BY MOUTH EVERY DAY   cyanocobalamin 1000 MCG tablet Commonly known as: VITAMIN B12 Take 1,000 mcg by mouth in the morning.   ferrous gluconate 324 MG tablet Commonly known as: FERGON TAKE 1 TABLET BY MOUTH EVERY DAY WITH BREAKFAST   FIBER PO Take 1  capsule by mouth in the morning.   finasteride 5 MG tablet Commonly known as: PROSCAR TAKE 1 TABLET BY MOUTH EVERY DAY   fluticasone 50 MCG/ACT nasal spray Commonly known as: FLONASE SPRAY 2 SPRAYS INTO EACH NOSTRIL EVERY DAY   glipiZIDE 10 MG tablet Commonly known as: GLUCOTROL Take 10 mg by mouth daily before supper.   GLUCOSAMINE HCL PO Take 1 tablet by mouth daily.   hydrALAZINE 50 MG tablet Commonly known as: APRESOLINE Take 1 tablet (50 mg total) by  mouth 3 (three) times daily as needed (If systolic BP greater than Q000111Q mmHg.). If systolic BP greater than Q000111Q mmHg.   lisinopril 20 MG tablet Commonly known as: ZESTRIL Take 1 tablet (20 mg total) by mouth daily. Start taking on: April 09, 2022   metFORMIN 500 MG tablet Commonly known as: GLUCOPHAGE TAKE 2 TABLETS BY MOUTH TWICE A DAY WITH MEAL FOR DIABETES   pantoprazole 40 MG tablet Commonly known as: PROTONIX Take 1 tablet (40 mg total) by mouth daily. For heartburn   sildenafil 100 MG tablet Commonly known as: Viagra Take 1 tablet (100 mg total) by mouth daily as needed for erectile dysfunction.   tamsulosin 0.4 MG Caps capsule Commonly known as: FLOMAX TAKE 1 CAPSULE BY MOUTH 2 TIMES DAILY.   Trelegy Ellipta 100-62.5-25 MCG/ACT Aepb Generic drug: Fluticasone-Umeclidin-Vilant Inhale 1 puff into the lungs daily.       No Known Allergies Discharge Instructions     Call MD for:  difficulty breathing, headache or visual disturbances   Complete by: As directed    Call MD for:  extreme fatigue   Complete by: As directed    Call MD for:  persistant dizziness or light-headedness   Complete by: As directed    Call MD for:  persistant nausea and vomiting   Complete by: As directed    Call MD for:  severe uncontrolled pain   Complete by: As directed    Call MD for:  temperature >100.4   Complete by: As directed    Diet - low sodium heart healthy   Complete by: As directed    Discharge instructions   Complete by: As directed    Follow with PCP in 1 week, monitor BP at home and follow-up with PCP to titrate medications accordingly.  Prescribed hydralazine 50 mg p.o. 3 times daily as needed if systolic BP greater than Q000111Q mmHg. Follow-up with vascular surgery in 1 to 2 weeks Follow-up with urology for BPH and urinary tension which is chronic, patient does In-N-Out catheter at home.   Increase activity slowly   Complete by: As directed        The results of significant  diagnostics from this hospitalization (including imaging, microbiology, ancillary and laboratory) are listed below for reference.    Significant Diagnostic Studies: PERIPHERAL VASCULAR CATHETERIZATION  Result Date: 04/07/2022 See surgical note for result.  EEG adult  Result Date: 04/06/2022 Greta Doom, MD     04/06/2022  4:02 PM History: 77 year old male who presented with transient left-sided symptoms Sedation: None Technique: This EEG was acquired with electrodes placed according to the International 10-20 electrode system (including Fp1, Fp2, F3, F4, C3, C4, P3, P4, O1, O2, T3, T4, T5, T6, A1, A2, Fz, Cz, Pz). The following electrodes were missing or displaced: none. Background: The background consists of intermixed alpha and beta activities. There is a well defined posterior dominant rhythm of seven to eight hz that attenuates with eye opening.  At times  there is suggestion of asymmetric posterior dominant rhythm with attenuation on the right, but this does not usually meet the greater than 50% requirement for asymmetry.  There is an increase in slow activity associated with Photic stimulation: Physiologic driving is present EEG Abnormalities: Borderline slow PDR Clinical Interpretation: This EEG is recorded in the waking and drowsy state.  With borderline PDR, this can be seen in the elderly is a normal variant, but can also represent a mild encephalopathy.  There was no seizure or seizure predisposition recorded on this study. Please note that lack of epileptiform activity on EEG does not preclude the possibility of epilepsy. Roland Rack, MD [facility] [phone] If 7pm- 7am, please page neurology on call as listed in [place].   ECHOCARDIOGRAM COMPLETE  Result Date: 04/06/2022    ECHOCARDIOGRAM REPORT   Patient Name:   CRISHAWN SEGEL Date of Exam: 04/06/2022 Medical Rec #:  [id number]     Height:       66.0 in Accession #:    [id number]    Weight:       177.9 lb Date of  Birth:  1945-08-04    BSA:          1.903 m Patient Age:    77 years      BP:           110/60 mmHg Patient Gender: M             HR:           59 bpm. Exam Location:  [facility] Procedure: 2D Echo, Color Doppler and Cardiac Doppler Indications:     Stroke I63.9  History:         Patient has prior history of Echocardiogram examinations, most                  recent 05/20/2021. Risk Factors:Dyslipidemia and Hypertension.                  Emphysema of lung.  Sonographer:     Sherrie Sport Referring Phys:  [id number] Val Riles Diagnosing Phys: Ida Rogue MD IMPRESSIONS  1. Left ventricular ejection fraction, by estimation, is 60 to 65%. The left ventricle has normal function. The left ventricle has no regional wall motion abnormalities. There is moderate left ventricular hypertrophy. Left ventricular diastolic parameters are consistent with Grade I diastolic dysfunction (impaired relaxation).  2. Right ventricular systolic function is normal. The right ventricular size is normal. There is normal pulmonary artery systolic pressure. The estimated right ventricular systolic pressure is 123456 mmHg.  3. The mitral valve is normal in structure. Mild mitral valve regurgitation. No evidence of mitral stenosis.  4. The aortic valve is tricuspid. Aortic valve regurgitation is not visualized. Aortic valve sclerosis is present, with no evidence of aortic valve stenosis.  5. The inferior vena cava is normal in size with greater than 50% respiratory variability, suggesting right atrial pressure of 3 mmHg. FINDINGS  Left Ventricle: Left ventricular ejection fraction, by estimation, is 60 to 65%. The left ventricle has normal function. The left ventricle has no regional wall motion abnormalities. The left ventricular internal cavity size was normal in size. There is  moderate left ventricular hypertrophy. Left ventricular diastolic parameters are consistent with Grade I diastolic dysfunction (impaired relaxation). Right Ventricle: The right  ventricular size is normal. No increase in right ventricular wall thickness. Right ventricular systolic function is normal. There is normal pulmonary artery systolic pressure. The tricuspid regurgitant velocity is 1.78 m/s,  and  with an assumed right atrial pressure of 5 mmHg, the estimated right ventricular systolic pressure is 123456 mmHg. Left Atrium: Left atrial size was normal in size. Right Atrium: Right atrial size was normal in size. Pericardium: There is no evidence of pericardial effusion. Mitral Valve: The mitral valve is normal in structure. There is mild calcification of the mitral valve leaflet(s). Mild mitral valve regurgitation. No evidence of mitral valve stenosis. Tricuspid Valve: The tricuspid valve is normal in structure. Tricuspid valve regurgitation is mild . No evidence of tricuspid stenosis. Aortic Valve: The aortic valve is tricuspid. Aortic valve regurgitation is not visualized. Aortic valve sclerosis is present, with no evidence of aortic valve stenosis. Aortic valve mean gradient measures 2.0 mmHg. Aortic valve peak gradient measures 3.5  mmHg. Aortic valve area, by VTI measures 3.14 cm. Pulmonic Valve: The pulmonic valve was normal in structure. Pulmonic valve regurgitation is not visualized. No evidence of pulmonic stenosis. Aorta: The aortic root is normal in size and structure. Venous: The inferior vena cava is normal in size with greater than 50% respiratory variability, suggesting right atrial pressure of 3 mmHg. IAS/Shunts: No atrial level shunt detected by color flow Doppler.  LEFT VENTRICLE PLAX 2D LVIDd:         4.10 cm   Diastology LVIDs:         2.60 cm   LV e' medial:    5.66 cm/s LV PW:         1.60 cm   LV E/e' medial:  14.0 LV IVS:        1.60 cm   LV e' lateral:   5.77 cm/s LVOT diam:     2.00 cm   LV E/e' lateral: 13.7 LV SV:         46 LV SV Index:   24 LVOT Area:     3.14 cm  RIGHT VENTRICLE RV Basal diam:  2.30 cm RV Mid diam:    2.00 cm RV S prime:     12.30 cm/s  TAPSE (M-mode): 2.4 cm LEFT ATRIUM             Index        RIGHT ATRIUM          Index LA diam:        2.80 cm 1.47 cm/m   RA Area:     8.73 cm LA Vol (A2C):   40.1 ml 21.07 ml/m  RA Volume:   14.00 ml 7.36 ml/m LA Vol (A4C):   48.3 ml 25.38 ml/m LA Biplane Vol: 46.0 ml 24.18 ml/m  AORTIC VALVE AV Area (Vmax):    2.42 cm AV Area (Vmean):   3.00 cm AV Area (VTI):     3.14 cm AV Vmax:           93.30 cm/s AV Vmean:          60.800 cm/s AV VTI:            0.148 m AV Peak Grad:      3.5 mmHg AV Mean Grad:      2.0 mmHg LVOT Vmax:         71.90 cm/s LVOT Vmean:        58.000 cm/s LVOT VTI:          0.148 m LVOT/AV VTI ratio: 1.00  AORTA Ao Root diam: 3.10 cm MITRAL VALVE               TRICUSPID VALVE MV Area (  PHT): 3.21 cm    TR Peak grad:   12.7 mmHg MV Decel Time: 236 msec    TR Vmax:        178.00 cm/s MV E velocity: 79.10 cm/s MV A velocity: 94.90 cm/s  SHUNTS MV E/A ratio:  0.83        Systemic VTI:  0.15 m                            Systemic Diam: 2.00 cm Ida Rogue MD Electronically signed by Ida Rogue MD Signature Date/Time: 04/06/2022/2:51:36 PM    Final    MR BRAIN WO CONTRAST  Result Date: 04/05/2022 CLINICAL DATA:  Neuro deficit, acute, stroke suspected. Left-sided weakness and headache. EXAM: MRI HEAD WITHOUT CONTRAST TECHNIQUE: Multiplanar, multiecho pulse sequences of the brain and surrounding structures were obtained without intravenous contrast. COMPARISON:  Head CT and CTA 04/05/2022 FINDINGS: Brain: There is no evidence of an acute infarct, intracranial hemorrhage, mass, midline shift, or extra-axial fluid collection. A moderate-sized chronic right parietal infarct is again noted. No age advanced chronic white matter disease is seen elsewhere. There is mild cerebral atrophy. Vascular: Major intracranial vascular flow voids are preserved. Skull and upper cervical spine: Unremarkable bone marrow signal. Sinuses/Orbits: Unremarkable orbits. Mild mucosal thickening in the right  sphenoid sinus. No significant mastoid fluid. Other: None. IMPRESSION: 1. No acute intracranial abnormality. 2. Chronic right parietal infarct. Electronically Signed   By: Logan Bores M.D.   On: 04/05/2022 12:41   DG Tibia/Fibula Left  Result Date: 04/05/2022 CLINICAL DATA:  Neuro deficit, acute, stroke suspected. Encounter for imaging to screen for metal prior to MRI EXAM: LEFT TIBIA AND FIBULA - 2 VIEW COMPARISON:  None Available. FINDINGS: Vascular stent in the partially imaged distal femur. No other foreign bodies identified. No evidence of acute fracture or joint malalignment. IMPRESSION: 1. No acute finding. 2. Partially imaged stent. Electronically Signed   By: Margaretha Sheffield M.D.   On: 04/05/2022 10:01   DG FEMUR MIN 2 VIEWS LEFT  Result Date: 04/05/2022 CLINICAL DATA:  Evaluate for metallic foreign body prior to MRI. EXAM: LEFT FEMUR 2 VIEWS COMPARISON:  01/12/2020 FINDINGS: The left hip appears located. No signs acute fracture or dislocation. There are multiple stents identified along the course of the left femoral artery. These have been present since at least 01/12/2020. No additional metallic foreign bodies identified. IMPRESSION: 1. No acute findings. 2. Multiple stents along the course of the left femoral artery. Electronically Signed   By: Kerby Moors M.D.   On: 04/05/2022 09:58   CT ANGIO HEAD NECK W WO CM W PERF (CODE STROKE)  Result Date: 04/05/2022 CLINICAL DATA:  Neuro deficit, acute, stroke suspected EXAM: CT ANGIOGRAPHY HEAD AND NECK TECHNIQUE: Multidetector CT imaging of the head and neck was performed using the standard protocol during bolus administration of intravenous contrast. Multiplanar CT image reconstructions and MIPs were obtained to evaluate the vascular anatomy. Carotid stenosis measurements (when applicable) are obtained utilizing NASCET criteria, using the distal internal carotid diameter as the denominator. RADIATION DOSE REDUCTION: This exam was performed  according to the departmental dose-optimization program which includes automated exposure control, adjustment of the mA and/or kV according to patient size and/or use of iterative reconstruction technique. CONTRAST:  126m OMNIPAQUE IOHEXOL 350 MG/ML SOLN COMPARISON:  Same day CT head.  CT chest November 07, 2021. FINDINGS: CTA NECK FINDINGS Aortic arch: Great vessel origins are patent without Right carotid  system: Approximately 70% stenosis of the right ICA origin due to atherosclerosis. Left carotid system: Approximately 80% stenosis of the ICA origin due to atherosclerosis. Vertebral arteries: Left dominant. Moderate multifocal stenosis of the left vertebral artery. The right vertebral artery is small throughout its course. Skeleton: No acute findings on limited assessment. Multilevel degenerative change. Other neck: No acute findings on limited assessment. Upper chest: Left hilar soft tissue with adjacent probable post treatment change in the left upper lobe. Esophageal wall thickening. Also, significant including left paratracheal 3.2 by 1.6 cm lymph node. Review of the MIP images confirms the above findings CTA HEAD FINDINGS Anterior circulation: Moderate atherosclerosis and narrowing of the intracranial ICAs bilaterally. Bilateral MCAs and ACAs are patent without proximal high-grade stenosis. Hypoplastic right A1 ACA. Attenuated vessels an area of calcification in the prior right parietal infarct. Posterior circulation: Small/non dominant right vertebral artery is poorly opacified intradurally. Left vertebral artery and basilar artery are patent. Both posterior cerebral arteries are patent without proximal high-grade stenosis. Venous sinuses: Limited assessment due to arterial timing. No obvious evidence of dural venous sinus thrombosis. Review of the MIP images confirms the above findings IMPRESSION: 1. No emergent large vessel occlusion. 2. 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  3. Severe (approximately 70%) stenosis of the right ICA origin the neck. 4. Moderate atherosclerotic narrowing of the intracranial ICAs bilaterally. 5. Small/non dominant right vertebral artery is poorly opacified intradurally. 6. Left hilar soft tissue with adjacent probable post treatment change in the left upper lobe. Also, significant interval increase in left paratracheal 3.2 by 1.6 cm lymph node. A follow-up CT of the chest (preferably with contrast) is recommended to better assess for disease progression. 7. Esophageal wall thickening. Preliminary findings discussed with Dr. Leonel Ramsay via telephone at 8:45 a.m. Electronically Signed   By: Margaretha Sheffield M.D.   On: 04/05/2022 08:57   CT HEAD CODE STROKE WO CONTRAST  Result Date: 04/05/2022 CLINICAL DATA:  Code stroke. Neuro deficit, acute, stroke suspected. EXAM: CT HEAD WITHOUT CONTRAST TECHNIQUE: Contiguous axial images were obtained from the base of the skull through the vertex without intravenous contrast. RADIATION DOSE REDUCTION: This exam was performed according to the departmental dose-optimization program which includes automated exposure control, adjustment of the mA and/or kV according to patient size and/or use of iterative reconstruction technique. COMPARISON:  Report from head CT 07/29/2001 (images unavailable). FINDINGS: Brain: Mild-to-moderate generalized cerebral atrophy. Chronic cortical/subcortical right MCA territory infarct within the right parietal lobe and right temporoparietal junction. There is no acute intracranial hemorrhage. No acute demarcated cortical infarct. No extra-axial fluid collection. No evidence of an intracranial mass. No midline shift. Vascular: Atherosclerotic calcifications. Skull: No fracture or aggressive osseous lesion. Sinuses/Orbits: No mass or acute finding within the imaged orbits. Mild mucosal thickening within the right sphenoid sinus. ASPECTS [facility] Stroke Program Early CT Score) - Ganglionic  level infarction (caudate, lentiform nuclei, internal capsule, insula, M1-M3 cortex): 7 - Supraganglionic infarction (M4-M6 cortex): 3 Total score (0-10 with 10 being normal): 10 (when discounting chronic infarcts). No evidence of an acute intracranial abnormality. These results were called by telephone at the time of interpretation on 04/05/2022 at 8:27 am to provider Duffy Bruce , who verbally acknowledged these results. IMPRESSION: 1. No evidence of an acute intracranial abnormality. 2. Chronic cortical/subcortical right MCA territory infarct within the right parietal lobe and right temporoparietal junction. 3. Mild-to-moderate generalized cerebral atrophy. 4. Mild mucosal thickening within the right maxillary sinus Electronically Signed  By: Kellie Simmering D.O.   On: 04/05/2022 08:30    Microbiology: Recent Results (from the past 240 hour(s))  Urine Culture (for pregnant, neutropenic or urologic patients or patients with an indwelling urinary catheter)     Status: Abnormal   Collection Time: 04/05/22 12:53 PM   Specimen: Urine, Clean Catch  Result Value Ref Range Status   Specimen Description   Final    URINE, CLEAN CATCH Performed at [facility], [address] [place], [place] [address]    Special Requests   Final    NONE Performed at [facility], [address] [place], [place] [address]    Culture >=100,000 COLONIES/mL ESCHERICHIA COLI (A)  Final   Report Status 04/08/2022 FINAL  Final   Organism ID, Bacteria ESCHERICHIA COLI (A)  Final      Susceptibility   Escherichia coli - MIC*    AMPICILLIN >=32 RESISTANT Resistant     CEFAZOLIN 32 INTERMEDIATE Intermediate     CEFEPIME <=0.12 SENSITIVE Sensitive     CEFTRIAXONE 0.5 SENSITIVE Sensitive     CIPROFLOXACIN <=0.25 SENSITIVE Sensitive     GENTAMICIN <=1 SENSITIVE Sensitive     IMIPENEM <=0.25 SENSITIVE Sensitive     NITROFURANTOIN <=16 SENSITIVE Sensitive     TRIMETH/SULFA <=20 SENSITIVE Sensitive      AMPICILLIN/SULBACTAM >=32 RESISTANT Resistant     PIP/TAZO <=4 SENSITIVE Sensitive     * >=100,000 COLONIES/mL ESCHERICHIA COLI  MRSA Next Gen by PCR, Nasal     Status: Abnormal   Collection Time: 04/07/22  6:25 PM   Specimen: Nasal Mucosa; Nasal Swab  Result Value Ref Range Status   MRSA by PCR Next Gen DETECTED (A) NOT DETECTED Final    Comment: RESULT CALLED TO, READ BACK BY AND VERIFIED WITH: GABBY BERNARDE 04/08/2022 AT 0410 SRR (NOTE) The GeneXpert MRSA Assay (FDA approved for NASAL specimens only), is one component of a comprehensive MRSA colonization surveillance program. It is not intended to diagnose MRSA infection nor to guide or monitor treatment for MRSA infections. Test performance is not FDA approved in patients less than 20 years old. Performed at [facility], [address] [place],  [address]      Labs: CBC: Recent Labs  Lab 04/05/22 [phone] 04/05/22 1504 04/06/22 0320 04/07/22 0407 04/08/22 0634  WBC 13.3* 12.1* 11.7* 7.3 7.4  NEUTROABS 12.6*  --   --   --   --   HGB 13.0 14.1 12.8* 11.7* 12.0*  HCT 40.1 43.1 38.5* 36.3* 36.3*  MCV 87.9 87.2 87.5 87.9 87.1  PLT 309 345 314 266 [id number]   Basic Metabolic Panel: Recent Labs  Lab 04/05/22 0839 04/05/22 1504 04/06/22 0320 04/07/22 0407 04/08/22 0631 04/08/22 0634  NA 132*  --  139 140  --  138  K 4.4  --  3.5 3.5  --  3.5  CL 98  --  104 107  --  107  CO2 23  --  26 24  --  22  GLUCOSE 378*  --  143* 108*  --  140*  BUN 19  --  31* 33*  --  17  CREATININE 0.90 1.12 1.46* 1.17  --  0.83  CALCIUM 8.3*  --  8.3* 8.1*  --  8.2*  MG  --   --   --  1.5* 1.8  --   PHOS  --   --   --  3.5 3.1  --    Liver Function Tests:  Recent Labs  Lab 04/05/22 0839 04/06/22 0320  AST 19 12*  ALT 13 11  ALKPHOS 58 47  BILITOT 0.6 0.5  PROT 6.3* 6.0*  ALBUMIN 3.4* 3.1*   No results for input(s): "LIPASE", "AMYLASE" in the last 168 hours. Recent Labs  Lab 04/06/22 0320  AMMONIA 22   Cardiac  Enzymes: No results for input(s): "CKTOTAL", "CKMB", "CKMBINDEX", "TROPONINI" in the last 168 hours. BNP (last 3 results) No results for input(s): "BNP" in the last 8760 hours. CBG: Recent Labs  Lab 04/07/22 2054 04/07/22 2345 04/08/22 0425 04/08/22 0734 04/08/22 1122  GLUCAP 221* 199* 145* 189* 290*    Time spent: 35 minutes  Signed:  Val Riles  [facility] 04/08/2022 12:10 PM

## 2022-04-08 NOTE — Progress Notes (Signed)
       CROSS COVER NOTE  NAME: Antonio [name] MRN: [id number] DOB : 1945-03-24    HPI/Events of Note   Patient post carotic stent with blood pressures [phone]/ 60's and sinus bradycardia  Assessment and  Interventions   Assessment:  Plan: Prn blood pressure controlling meds changed from beta blocker to hydralazine      Kathlene Cote NP [facility]

## 2022-04-08 NOTE — Progress Notes (Signed)
      Daily Progress Note   Assessment/Planning:   POD #1 s/p R CAS Urinary retention  Per pt, his normal HR is low in 40s and BP >150 Neuro intact Cont Statin + DAPT From vascular viewpoint, ok to discharge though urinary retention might interfere with discharge   Subjective  - 1 Day Post-Op   No events overnight   Objective   Vitals:   04/08/22 0600 04/08/22 0630 04/08/22 0700 04/08/22 0701  BP: (!) 158/63 (!) 165/76  (!) 161/69  Pulse: (!) 55 64 71 77  Resp: '15 14 19 19  '$ Temp:    98.1 F (36.7 C)  TempSrc:    Oral  SpO2: 96% 92% 94% 98%  Weight:      Height:         Intake/Output Summary (Last 24 hours) at 04/08/2022 0931 Last data filed at 04/08/2022 0701 Gross per 24 hour  Intake 1570.58 ml  Output 2650 ml  Net -1079.42 ml    VASC R groin: some ooze, no hematoma, no echymosis  NEURO CN 2-12 intact, motor 5/5 sym, speech fluent    Laboratory   CBC    Latest Ref Rng & Units 04/08/2022    6:34 AM 04/07/2022    4:07 AM 04/06/2022    3:20 AM  CBC  WBC 4.0 - 10.5 K/uL 7.4  7.3  11.7   Hemoglobin 13.0 - 17.0 g/dL 12.0  11.7  12.8   Hematocrit 39.0 - 52.0 % 36.3  36.3  38.5   Platelets 150 - 400 K/uL 272  266  314     BMET    Component Value Date/Time   NA 138 04/08/2022 0634   NA 141 07/10/2014 1025   K 3.5 04/08/2022 0634   CL 107 04/08/2022 0634   CO2 22 04/08/2022 0634   GLUCOSE 140 (H) 04/08/2022 0634   BUN 17 04/08/2022 0634   BUN 12 07/10/2014 1025   CREATININE 0.83 04/08/2022 0634   CALCIUM 8.2 (L) 04/08/2022 0634   GFRNONAA >60 04/08/2022 0634   GFRAA >60 05/31/2018 0529     Adele Barthel, MD, FACS, FSVS Covering for [facility]: [phone]  04/08/2022, 9:31 AM

## 2022-04-10 ENCOUNTER — Encounter: Payer: Self-pay | Admitting: Vascular Surgery

## 2022-04-10 ENCOUNTER — Telehealth: Payer: Self-pay | Admitting: *Deleted

## 2022-04-10 ENCOUNTER — Other Ambulatory Visit: Payer: Self-pay | Admitting: Urology

## 2022-04-10 DIAGNOSIS — N138 Other obstructive and reflux uropathy: Secondary | ICD-10-CM

## 2022-04-10 LAB — GLUCOSE, CAPILLARY: Glucose-Capillary: 191 mg/dL — ABNORMAL HIGH (ref 70–99)

## 2022-04-10 NOTE — Transitions of Care (Post Inpatient/ED Visit) (Signed)
   04/10/2022  Name: Antonio [name] MRN: [id number] DOB: [birth year]  Today's TOC FU Call Status: Today's TOC FU Call Status:: Successful TOC FU Call Competed TOC FU Call Complete Date: 04/10/22  Transition Care Management Follow-up Telephone Call Date of Discharge: 04/08/22 Discharge Facility: [facility]) Type of Discharge: Inpatient Admission Primary Inpatient Discharge Diagnosis:: encephalopathy How have you been since you were released from the hospital?: Better Any questions or concerns?: No  Items Reviewed: Did you receive and understand the discharge instructions provided?: Yes Medications obtained and verified?: Yes (Medications Reviewed) Any new allergies since your discharge?: No Dietary orders reviewed?: No Do you have support at home?: Yes People in Home: significant other Name of Support/Comfort Primary Source: [name] and Equipment/Supplies: [place] Ordered?: No Any new equipment or medical supplies ordered?: No  Functional Questionnaire: Do you need assistance with bathing/showering or dressing?: No Do you need assistance with meal preparation?: Yes Do you need assistance with eating?: No Do you have difficulty maintaining continence: No Do you need assistance with getting out of bed/getting out of a chair/moving?: No Do you have difficulty managing or taking your medications?: No  Folllow up appointments reviewed: PCP Follow-up appointment confirmed?: Yes (Care guide scheduled) Date of PCP follow-up appointment?: 04/14/22 Follow-up Provider: Dr [place] [facility] Follow-up appointment confirmed?: Yes Date of Specialist follow-up appointment?: 04/13/22 Follow-Up Specialty Provider:: Dr Caryl Comes 9:30 Do you need transportation to your follow-up appointment?: No Do you understand care options if your condition(s) worsen?: Yes-patient verbalized understanding  SDOH Interventions Today     Flowsheet Row Most Recent Value  SDOH Interventions   Food Insecurity Interventions Intervention Not Indicated  Housing Interventions Intervention Not Indicated  Transportation Interventions Intervention Not Indicated      Care guide schedule f/u appt with Dr Glori Bickers  Johny Shock BSN [place] [facility] [phone]

## 2022-04-12 ENCOUNTER — Ambulatory Visit: Payer: Self-pay

## 2022-04-12 NOTE — Chronic Care Management (AMB) (Signed)
   04/12/2022  Antonio [name] Jun 17, 1945 [id number]  Reason for Encounter: Change in CCM enrollment status   Horris Latino RN Care Manager/Chronic Care Management [phone]

## 2022-04-13 ENCOUNTER — Encounter: Payer: Self-pay | Admitting: Internal Medicine

## 2022-04-13 ENCOUNTER — Ambulatory Visit: Payer: Medicare Other | Attending: Internal Medicine | Admitting: Internal Medicine

## 2022-04-13 VITALS — BP 170/70 | HR 52 | Ht 66.0 in | Wt 180.5 lb

## 2022-04-13 DIAGNOSIS — R001 Bradycardia, unspecified: Secondary | ICD-10-CM | POA: Diagnosis not present

## 2022-04-13 NOTE — Progress Notes (Signed)
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  Some significant fatigue after that, but fatigue has been a big issue.  He also describes imbalance.  This refers to instability upon standing, prolonged standing in the shower, getting up off the commode.  Needs to steady himself and then gradually abates.  He reminded me he has diabetes for a couple of decades.  Has poor feeling in his feet.  Never been told he had diabetic neuropathy.  The patient denies chest pain, nocturnal dyspnea, orthopnea or peripheral edema.  There have been no palpitations  or syncope.  Complains of chronic mild dyspnea recurrent orthostatic lightheadedness   As best as I can tell he has been on aspirin and Plavix since 2006 when he presented with chest pain ruled out for MI.  Also has a history of peripheral vascular disease having undergone revascularization 4/20 but then had further issues requiring restenting.  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% stenosis Following hospitalization 3/24 for encephalopathy, UTI and found to have left-sided weakness DATE TEST EF   4/23 CTA  Concern for sign Cx lesion  4/23 Echo   60-65 %   4/23 LHC  Nonobstructive CAD  2/24 Echo  60-65%       Date Cr K Hgb LDL  11/23 incl (CE) 1.0<<1.61<<0.9 4.3 14.1   3/24    48       Past Medical History:  Diagnosis Date   Anemia    Arthritis    Asthma    age 77   BPH with urinary obstruction    stable on flomax ([name]   Coronary atherosclerosis of unspecified type of vessel, native or graft    Diabetes mellitus    Type II    Emphysema of lung (HCC)    Esophageal stricture    Gastritis    GERD (gastroesophageal reflux disease)    Hiatal hernia    Hx of colonic polyps    Hyperlipidemia    Hypertension    Internal hemorrhoids without mention of complication    Lumbago    Pain in both lower legs    Pneumonia    PSA elevation    now averaging 2's   Tobacco abuse    URI (upper respiratory infection)    Urinary retention    Current Meds  Medication Sig   acetaminophen (TYLENOL) 500 MG tablet Take 1,000 mg by mouth every 6 (six) hours as needed (hip pain.). Rapid release   aspirin 81 MG EC tablet Take 81 mg by mouth in the morning.   atorvastatin (LIPITOR) 20 MG tablet TAKE 1 TABLET BY MOUTH IN THE EVENING FOR CHOLESTEROL   ciprofloxacin (CIPRO) 500 MG tablet Take 1 tablet (500 mg total) by mouth 2 (two) times daily for 7 days.   clopidogrel (PLAVIX) 75 MG tablet TAKE 1 TABLET BY MOUTH EVERY DAY   ferrous gluconate (FERGON) 324 MG tablet TAKE 1 TABLET BY MOUTH EVERY DAY WITH BREAKFAST   FIBER PO Take 1 capsule by mouth in the morning.   finasteride (PROSCAR) 5 MG tablet TAKE 1  TABLET BY MOUTH EVERY DAY   fluticasone (FLONASE) 50 MCG/ACT nasal spray SPRAY 2 SPRAYS INTO EACH NOSTRIL EVERY DAY   Fluticasone-Umeclidin-Vilant (TRELEGY ELLIPTA) 100-62.5-25 MCG/ACT AEPB Inhale 1 puff into the lungs daily.   glipiZIDE (GLUCOTROL) 10 MG tablet Take 10 mg by mouth daily before supper.    GLUCOSAMINE HCL PO Take 1 tablet by mouth daily.   hydrALAZINE (APRESOLINE) 50 MG tablet Take 1 tablet (50 mg total) by mouth 3 (three) times daily as needed (If systolic BP greater than Q000111Q mmHg.). If systolic BP greater than Q000111Q mmHg.   Insulin Glargine (BASAGLAR KWIKPEN) 100 UNIT/ML Inject 30 Units into the skin daily.   lisinopril (ZESTRIL) 20 MG tablet Take 1 tablet (20 mg total) by mouth daily.   metFORMIN (GLUCOPHAGE) 500 MG tablet TAKE 2 TABLETS BY MOUTH TWICE A DAY WITH MEAL FOR DIABETES   pantoprazole (PROTONIX) 40 MG tablet  Take 1 tablet (40 mg total) by mouth daily. For heartburn   sildenafil (VIAGRA) 100 MG tablet Take 1 tablet (100 mg total) by mouth daily as needed for erectile dysfunction.   tamsulosin (FLOMAX) 0.4 MG CAPS capsule TAKE 1 CAPSULE BY MOUTH 2 TIMES DAILY.   vitamin B-12 (CYANOCOBALAMIN) 1000 MCG tablet Take 1,000 mcg by mouth in the morning.       PHYSICAL EXAM BP (!) 170/70 (BP Location: Left Arm, Patient Position: Sitting, Cuff Size: Normal)   Pulse (!) 52   Ht '5\' 6"'$  (1.676 m)   Wt 180 lb 8 oz (81.9 kg)   SpO2 97%   BMI 29.13 kg/m  Well developed and nourished in no acute distress HENT normal Neck supple with JVP-  flat   Clear Regular rate and rhythm, no murmurs or gallops Abd-soft with active BS No Clubbing cyanosis edema Skin-warm and dry A & Oriented  Grossly normal sensory and motor function  ECG sinus 60 or so with 4: 3 Wenckebach And unimpressive voltage   ECG sinus '@65'$  with 3: 2 Wenckebach at a rate of about 50 Q waves lead III and F QRSd 68/44     ASSESSMENT AND PLAN:  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  We will continue to follow.  Blood pressure is significantly elevated this morning.  Given his history of orthostasis, he has had some allowance for systolic hypertension he uses hydralazine on an as-needed basis.  Will continue to his regime although his hydrochlorothiazide was recently removed from his medical regime because of "kidneys concerns "and now his creatinine clearance is over 75.  If blood pressure remains elevated, would be inclined to resume his HCTZ.  He also has orthostatic hypotension and a discordance between voltage and hypertrophy.  Will plan a pyrophosphate  scan at his next visit.  Signed, Virl Axe MD  04/13/2022

## 2022-04-13 NOTE — Patient Instructions (Signed)
Medication Instructions:  Your physician recommends that you continue on your current medications as directed. Please refer to the Current Medication list given to you today.  *If you need a refill on your cardiac medications before your next appointment, please call your pharmacy*  Follow-Up: At [facility], you and your health needs are our priority.  As part of our continuing mission to provide you with exceptional heart care, we have created designated Provider Care Teams.  These Care Teams include your primary Cardiologist (physician) and Advanced Practice Providers (APPs -  Physician Assistants and Nurse Practitioners) who all work together to provide you with the care you need, when you need it.  Your next appointment:   6 month(s)  Provider:   You will see one of the following Advanced Practice Providers on your designated Care Team:   Renee Ursuy, PA-C Michael "Andy" Tillery, PA-C Suzann Riddle, NP    

## 2022-04-14 ENCOUNTER — Ambulatory Visit (INDEPENDENT_AMBULATORY_CARE_PROVIDER_SITE_OTHER): Payer: Medicare Other | Admitting: Family Medicine

## 2022-04-14 ENCOUNTER — Encounter: Payer: Self-pay | Admitting: Family Medicine

## 2022-04-14 VITALS — BP 135/60 | HR 71 | Temp 97.6°F | Ht 66.0 in | Wt 182.0 lb

## 2022-04-14 DIAGNOSIS — I6523 Occlusion and stenosis of bilateral carotid arteries: Secondary | ICD-10-CM

## 2022-04-14 DIAGNOSIS — C3490 Malignant neoplasm of unspecified part of unspecified bronchus or lung: Secondary | ICD-10-CM | POA: Diagnosis not present

## 2022-04-14 DIAGNOSIS — G9340 Encephalopathy, unspecified: Secondary | ICD-10-CM | POA: Diagnosis not present

## 2022-04-14 DIAGNOSIS — I10 Essential (primary) hypertension: Secondary | ICD-10-CM

## 2022-04-14 DIAGNOSIS — N138 Other obstructive and reflux uropathy: Secondary | ICD-10-CM | POA: Diagnosis not present

## 2022-04-14 DIAGNOSIS — N401 Enlarged prostate with lower urinary tract symptoms: Secondary | ICD-10-CM

## 2022-04-14 DIAGNOSIS — N390 Urinary tract infection, site not specified: Secondary | ICD-10-CM | POA: Diagnosis not present

## 2022-04-14 DIAGNOSIS — Z794 Long term (current) use of insulin: Secondary | ICD-10-CM | POA: Diagnosis not present

## 2022-04-14 DIAGNOSIS — E1165 Type 2 diabetes mellitus with hyperglycemia: Secondary | ICD-10-CM

## 2022-04-14 LAB — CBC WITH DIFFERENTIAL/PLATELET
Basophils Absolute: 0.1 10*3/uL (ref 0.0–0.1)
Basophils Relative: 0.9 % (ref 0.0–3.0)
Eosinophils Absolute: 0.4 10*3/uL (ref 0.0–0.7)
Eosinophils Relative: 4.2 % (ref 0.0–5.0)
HCT: 37.9 % — ABNORMAL LOW (ref 39.0–52.0)
Hemoglobin: 12.3 g/dL — ABNORMAL LOW (ref 13.0–17.0)
Lymphocytes Relative: 11.3 % — ABNORMAL LOW (ref 12.0–46.0)
Lymphs Abs: 1 10*3/uL (ref 0.7–4.0)
MCHC: 32.4 g/dL (ref 30.0–36.0)
MCV: 90 fl (ref 78.0–100.0)
Monocytes Absolute: 0.6 10*3/uL (ref 0.1–1.0)
Monocytes Relative: 6.6 % (ref 3.0–12.0)
Neutro Abs: 6.7 10*3/uL (ref 1.4–7.7)
Neutrophils Relative %: 77 % (ref 43.0–77.0)
Platelets: 281 10*3/uL (ref 150.0–400.0)
RBC: 4.21 Mil/uL — ABNORMAL LOW (ref 4.22–5.81)
RDW: 14.7 % (ref 11.5–15.5)
WBC: 8.7 10*3/uL (ref 4.0–10.5)

## 2022-04-14 LAB — COMPREHENSIVE METABOLIC PANEL
ALT: 23 U/L (ref 0–53)
AST: 21 U/L (ref 0–37)
Albumin: 3.3 g/dL — ABNORMAL LOW (ref 3.5–5.2)
Alkaline Phosphatase: 86 U/L (ref 39–117)
BUN: 15 mg/dL (ref 6–23)
CO2: 28 mEq/L (ref 19–32)
Calcium: 9 mg/dL (ref 8.4–10.5)
Chloride: 105 mEq/L (ref 96–112)
Creatinine, Ser: 0.89 mg/dL (ref 0.40–1.50)
GFR: 82.78 mL/min (ref >60.00)
Glucose, Bld: 181 mg/dL — ABNORMAL HIGH (ref 70–99)
Potassium: 4.4 mEq/L (ref 3.5–5.1)
Sodium: 140 mEq/L (ref 135–145)
Total Bilirubin: 0.2 mg/dL (ref 0.2–1.2)
Total Protein: 5.5 g/dL — ABNORMAL LOW (ref 6.0–8.3)

## 2022-04-14 MED ORDER — HYDRALAZINE HCL 50 MG PO TABS: 50.0000 mg | ORAL_TABLET | Freq: Three times a day (TID) | ORAL | 3 refills | Status: DC | PRN

## 2022-04-14 MED ORDER — LISINOPRIL 20 MG PO TABS: 20.0000 mg | ORAL_TABLET | Freq: Every day | ORAL | 1 refills | Status: DC

## 2022-04-14 NOTE — Progress Notes (Unsigned)
Subjective:    Patient ID: Antonio [name], male    DOB: July 20, 1945, 77 y.o.   MRN: [id number]  HPI 77 yo pt of [name] presents for hospital follow up for encephalopathy   Wt Readings from Last 3 Encounters:  04/14/22 182 lb (82.6 kg)  04/13/22 180 lb 8 oz (81.9 kg)  04/05/22 177 lb 14.6 oz (80.7 kg)   29.38 kg/m  Today's Vitals   04/14/22 0859 04/14/22 0923  BP: (!) 146/68 135/60  Pulse: 71   Temp: 97.6 F (36.4 C)   TempSrc: Temporal   SpO2: 96%   Weight: 182 lb (82.6 kg)   Height: '5\' 6"'$  (1.676 m)    Body mass index is 29.38 kg/m.    He was hospitalized from 2/28 to 04/08/22 Presented after episode of L sided weakness, n/v headache  Stroke work up neg Found to have uti  Also carotid stenosis  MRI neg  Neuro consulted     D/c on cipro for 7 d for uti  He does in/out cath  Was to f/u with urology for BPH in a week   Had R carotid art stend insertion   Hosp course Assessment and Plan: # Acute metabolic encephalopathy, CVA ruled out. Pt is back to his baseline Neurology consulted, recommended EEG and 2D echocardiogram to rule out intracardiac source blood clot EEG: Borderline slow PDR, may represent mild encephalopathy, no seizures recorded.  TTE LVEF 65% no WMA, LV hypertrophy grade 1 diastolic dysfunction.  No PFO # UTI, UA positive, s/p ceftriaxone 2 g IV daily.  Urine culture growing E. coli, sensitive to Cipro and ceftriaxone.  Patient was discharged on ciprofloxacin 40 mg p.o. twice daily for 7 days.  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  Follow-up with urology in 1 wk.    # AKI, Baseline creatinine 0.9,Creatinine 1.46 elevated could be secondary to IV contrast, Cr 1.17--0.83 improved s/p IV fluid.  Bladder scan was negative for urinary retention at that time.  After carotid stent placement patient had significant urine retention,  In-N-Out catheter was done 3 times.  Patient does not want Foley catheter and would like to go home as he does In-N-Out catheter at home.    # Carotid stenosis, bilateral. CTA head and neck w/ 80% stenosis on L and 70%stenosis on R. Vascular surgery consulted, s/p Rt carotid artery stent insertion, patient tolerated procedure well.  Without any complications.  Patient was cleared by vascular surgery to discharge home and follow-up as an outpatient.    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  continue home aspirin, Plavix and statin. LDL 48, triglyceride 243. Neurology consulted, recommended MRI brain which was negative for any acute findings, TTE, A1c, fasting lipid profile.  If symptoms resolve with migraine cocktail then follow-up as an outpatient.  Patient remained asymptomatic, denied any headache, no new neurological complaints.  #Hypomagnesemia, mag repleted.  Resolved   # GERD (gastroesophageal reflux disease), PPI  # Type 2 diabetes mellitus with hyperglycemia. Blood sugar in 300s on presentation without ketoacidosis. HbA1c 8.8, slightly elevated. Held home medications for now. S/p Levemir 16 units daily and NovoLog sliding scale. Continue diabetic diet, monitor FSBG.  Resumed home dose on discharge.  Recommended to follow with PCP for further management.   # Essential hypertension, Systolic BP was in [id number] on presentation.  MRI negative for acute CVA.  Discontinued hydrochlorothiazide due to AKI, resumed lisinopril 20 mg p.o. daily.  Prescribed hydralazine 50 mg p.o. 3 times daily as needed if SBP greater than 150 MAC.  Monitor BP at home and follow with PCP to titrate medications accordingly.  # HLD (hyperlipidemia)\, Cont statin   # BPH, continued Proscar and Flomax home dose  # Incidental findings, left hilar tissue and mediastinal lymphadenopathy CTA neck:  Left hilar soft tissue with adjacent  probable post treatment change in the left upper lobe. Also, significant interval increase in left paratracheal 3.2 by 1.6 cm lymph node. A follow-up CT of the chest (preferably with contrast) is recommended to better assess for disease progression. Patient already received IV contrast so CT chest was not done, creatinine is slightly elevated.  Patient was advised to follow with PCP for CT chest as an outpatient and further workup.  He has h/o small cell lung cancer   Inst to f/u with pcp/ monitor bp  Was px hydralazine 50 mg tid prn if bp over 150  F/u vasc surg in 1-2 wk F/u urology in a week  F/u labs   Lab Results  Component Value Date   CREATININE 0.83 04/08/2022   BUN 17 04/08/2022   NA 138 04/08/2022   K 3.5 04/08/2022   CL 107 04/08/2022   CO2 22 04/08/2022   Lab Results  Component Value Date   ALT 11 04/06/2022   AST 12 (L) 04/06/2022   ALKPHOS 47 04/06/2022   BILITOT 0.5 04/06/2022    Lab Results  Component Value Date   HGBA1C 8.8 (H) 04/05/2022   Lab Results  Component Value Date   WBC 7.4 04/08/2022   HGB 12.0 (L) 04/08/2022   HCT 36.3 (L) 04/08/2022   MCV 87.1 04/08/2022   PLT 272 04/08/2022     BPH Tamsolosin 0.8 mg daily  Finasteride 5 mg daily   HTN BP Readings from Last 3 Encounters:  04/14/22 (!) 146/68  04/13/22 (!) 170/70  04/08/22 (!) 144/73   Lisinopril 20 mg daily  Hydralazine 50 mg tid prn --has needed it 4 times since he came     Has endocrinoe f/u on 3/11 Has vasc appt 3/14   Sees Dr Patsey Berthold for pulmonary -- CT is coming up in April  Dr Ernestine Conrad for urology   Getting back to normal now No HH so for   Patient Active Problem List   Diagnosis Date Noted   Bilateral carotid artery stenosis 04/06/2022   Stroke-like symptoms 04/06/2022   Encephalopathy 04/05/2022   UTI (urinary tract infection) 04/05/2022   History of CVA (cerebrovascular accident) 04/05/2022   Fatigue 12/21/2021   Hearing loss of right ear due to cerumen  impaction 10/05/2021   Non-small cell lung cancer ([place]) 06/19/2021   Aortic atherosclerosis ([place]) 06/19/2021   Atrioventricular block, Mobitz type 1, Wenckebach 05/25/2021   Complete heart block ([place]) 05/20/2021   Gastric AVM    Iron deficiency anemia    Right buttock pain 01/12/2020   Fall at home, initial encounter 01/12/2020   Rash and nonspecific skin eruption 12/19/2019   Chronic pain of right hip 10/17/2019   Ischemic leg 05/29/2018   B12 deficiency 04/03/2018   PAD (peripheral artery disease) ([place]) 12/12/2017   Pain in limb 12/11/2017   GERD (gastroesophageal reflux disease) 04/30/2017   BPH with obstruction/lower urinary tract symptoms 10/14/2014   Essential hypertension 06/26/2014   Type 2 diabetes mellitus with hyperglycemia ([place]) 06/26/2014  HLD (hyperlipidemia) 06/27/2006   Coronary atherosclerosis 06/27/2006   Tobacco abuse 06/27/2006   Past Medical History:  Diagnosis Date   Anemia    Arthritis    Asthma    age 12   BPH with urinary obstruction    stable on flomax ([name]   Coronary atherosclerosis of unspecified type of vessel, native or graft    Diabetes mellitus    Type II   Emphysema of lung ([facility])    Esophageal stricture    Gastritis    GERD (gastroesophageal reflux disease)    Hiatal hernia    Hx of colonic polyps    Hyperlipidemia    Hypertension    Internal hemorrhoids without mention of complication    Lumbago    Pain in both lower legs    Pneumonia    PSA elevation    now averaging 2's   Tobacco abuse    URI (upper respiratory infection)    Urinary retention    Past Surgical History:  Procedure Laterality Date   2D Echo  06/03   Abdominal ultrasound  06/03   Negative   BACK SURGERY     CAROTID PTA/STENT INTERVENTION Right 04/07/2022   Procedure: CAROTID PTA/STENT INTERVENTION;  Surgeon: Katha Cabal, MD;  Location: [facility];  Service: Cardiovascular;  Laterality: Right;   COLONOSCOPY     CT of head  and sinuses   06/03   Negative   CT of the chest, abdomen, pelvis  06/03   Chest negative; Abdomen, left adrenal lesion; Pelvis, enlarged prostate   ESOPHAGOGASTRODUODENOSCOPY  02/13/07   gastritis and duodenitis without bleed   ESOPHAGOGASTRODUODENOSCOPY N/A 06/26/2014   Procedure: ESOPHAGOGASTRODUODENOSCOPY (EGD);  Surgeon: Milus Banister, MD;  Location: [facility];  Service: Endoscopy;  Laterality: N/A;   ESOPHAGOGASTRODUODENOSCOPY (EGD) WITH PROPOFOL N/A 04/08/2020   Procedure: ESOPHAGOGASTRODUODENOSCOPY (EGD) WITH PROPOFOL;  Surgeon: Ladene Artist, MD;  Location: [facility];  Service: Endoscopy;  Laterality: N/A;   HOT HEMOSTASIS N/A 04/08/2020   Procedure: HOT HEMOSTASIS (ARGON PLASMA COAGULATION/BICAP);  Surgeon: Ladene Artist, MD;  Location: [facility];  Service: Endoscopy;  Laterality: N/A;   KNEE ARTHROSCOPY  10/00   Right   LEFT HEART CATH AND CORONARY ANGIOGRAPHY N/A 05/23/2021   Procedure: LEFT HEART CATH AND CORONARY ANGIOGRAPHY;  Surgeon: [name], MD;  Location: [facility];  Service: Cardiovascular;  Laterality: N/A;   LOWER EXTREMITY ANGIOGRAPHY Right 12/27/2017   Procedure: LOWER EXTREMITY ANGIOGRAPHY;  Surgeon: Algernon Huxley, MD;  Location: [facility];  Service: Cardiovascular;  Laterality: Right;   LOWER EXTREMITY ANGIOGRAPHY Left 01/10/2018   Procedure: LOWER EXTREMITY ANGIOGRAPHY;  Surgeon: Algernon Huxley, MD;  Location: [facility];  Service: Cardiovascular;  Laterality: Left;   LOWER EXTREMITY ANGIOGRAPHY Left 05/29/2018   Procedure: LOWER EXTREMITY ANGIOGRAPHY;  Surgeon: Algernon Huxley, MD;  Location: [facility];  Service: Cardiovascular;  Laterality: Left;   LOWER EXTREMITY ANGIOGRAPHY Left 05/30/2018   Procedure: Lower Extremity Angiography;  Surgeon: Algernon Huxley, MD;  Location: [facility];  Service: Cardiovascular;  Laterality: Left;   LP  06/03   Microwave thermotherapy prostate  08/28/07   [place]   Persantine cardiolite   03/01   EF 68%   POLYPECTOMY  11/95   Stress myoview  07/06   EF 67%   UPPER GASTROINTESTINAL ENDOSCOPY     Social History   Tobacco Use   Smoking status: Former  Packs/day: 2.00    Years: 57.00    Total pack years: 114.00    Types: Cigarettes    Start date: 02/07/1964    Quit date: 05/11/2021    Years since quitting: 0.9   Smokeless tobacco: Former    Types: Nurse, children's Use: Never used  Substance Use Topics   Alcohol use: Not Currently    Alcohol/week: 0.0 standard drinks of alcohol    Comment: rare   Drug use: No   Family History  Problem Relation Age of Onset   Rheum arthritis Mother    Diabetes Father    Alcohol abuse Paternal Uncle    Alcohol abuse Paternal Uncle    Alcohol abuse Paternal Uncle    Heart disease Neg Hx    Stroke Neg Hx    Cancer Neg Hx    Drug abuse Neg Hx    Depression Neg Hx    Colon cancer Neg Hx    Rectal cancer Neg Hx    Stomach cancer Neg Hx    Kidney cancer Neg Hx    Bladder Cancer Neg Hx    Prostate cancer Neg Hx    No Known Allergies Current Outpatient Medications on File Prior to Visit  Medication Sig Dispense Refill   acetaminophen (TYLENOL) 500 MG tablet Take 1,000 mg by mouth every 6 (six) hours as needed (hip pain.). Rapid release     aspirin 81 MG EC tablet Take 81 mg by mouth in the morning.     atorvastatin (LIPITOR) 20 MG tablet TAKE 1 TABLET BY MOUTH IN THE EVENING FOR CHOLESTEROL 90 tablet 1   ciprofloxacin (CIPRO) 500 MG tablet Take 1 tablet (500 mg total) by mouth 2 (two) times daily for 7 days. 14 tablet 0   clopidogrel (PLAVIX) 75 MG tablet TAKE 1 TABLET BY MOUTH EVERY DAY 90 tablet 2   ferrous gluconate (FERGON) 324 MG tablet TAKE 1 TABLET BY MOUTH EVERY DAY WITH BREAKFAST 30 tablet 0   FIBER PO Take 1 capsule by mouth in the morning.     finasteride (PROSCAR) 5 MG tablet TAKE 1 TABLET BY MOUTH EVERY DAY 90 tablet 3   fluticasone (FLONASE) 50 MCG/ACT nasal spray SPRAY 2 SPRAYS INTO EACH NOSTRIL EVERY  DAY 16 mL 5   Fluticasone-Umeclidin-Vilant (TRELEGY ELLIPTA) 100-62.5-25 MCG/ACT AEPB Inhale 1 puff into the lungs daily. 28 each 11   glipiZIDE (GLUCOTROL) 10 MG tablet Take 10 mg by mouth daily before supper.      GLUCOSAMINE HCL PO Take 1 tablet by mouth daily.     Insulin Glargine (BASAGLAR KWIKPEN) 100 UNIT/ML Inject 30 Units into the skin daily.     metFORMIN (GLUCOPHAGE) 500 MG tablet TAKE 2 TABLETS BY MOUTH TWICE A DAY WITH MEAL FOR DIABETES 360 tablet 2   pantoprazole (PROTONIX) 40 MG tablet Take 1 tablet (40 mg total) by mouth daily. For heartburn 90 tablet 3   sildenafil (VIAGRA) 100 MG tablet Take 1 tablet (100 mg total) by mouth daily as needed for erectile dysfunction. 30 tablet 0   tamsulosin (FLOMAX) 0.4 MG CAPS capsule TAKE 1 CAPSULE BY MOUTH 2 TIMES DAILY. 180 capsule 3   vitamin B-12 (CYANOCOBALAMIN) 1000 MCG tablet Take 1,000 mcg by mouth in the morning.     No current facility-administered medications on file prior to visit.     Review of Systems  Constitutional:  Positive for fatigue. Negative for activity change, appetite change, fever and unexpected weight change.  HENT:  Negative for congestion, rhinorrhea, sore throat and trouble swallowing.   Eyes:  Negative for pain, redness, itching and visual disturbance.  Respiratory:  Negative for cough, chest tightness, shortness of breath and wheezing.   Cardiovascular:  Negative for chest pain and palpitations.  Gastrointestinal:  Negative for abdominal pain, blood in stool, constipation, diarrhea and nausea.  Endocrine: Negative for cold intolerance, heat intolerance, polydipsia and polyuria.  Genitourinary:  Negative for difficulty urinating, dysuria, frequency and urgency.       No problems with in/out cath  Musculoskeletal:  Negative for arthralgias, joint swelling and myalgias.  Skin:  Negative for pallor and rash.  Neurological:  Negative for dizziness, tremors, weakness, numbness and headaches.  Hematological:   Negative for adenopathy. Does not bruise/bleed easily.  Psychiatric/Behavioral:  Negative for decreased concentration and dysphoric mood. The patient is not nervous/anxious.        Objective:   Physical Exam Constitutional:      General: He is not in acute distress.    Appearance: Normal appearance. He is well-developed. He is obese. He is not ill-appearing or diaphoretic.     Comments: Mildly fatigued   HENT:     Head: Normocephalic and atraumatic.  Eyes:     General: No scleral icterus.    Conjunctiva/sclera: Conjunctivae normal.     Pupils: Pupils are equal, round, and reactive to light.  Neck:     Thyroid: No thyromegaly.     Vascular: No carotid bruit or JVD.  Cardiovascular:     Rate and Rhythm: Normal rate and regular rhythm.     Heart sounds: Normal heart sounds.     No gallop.  Pulmonary:     Effort: Pulmonary effort is normal. No respiratory distress.     Breath sounds: Normal breath sounds. No wheezing or rales.  Abdominal:     General: There is no distension or abdominal bruit.     Palpations: Abdomen is soft. There is no mass.     Tenderness: There is no right CVA tenderness or left CVA tenderness.     Comments: No suprapubic tenderness or fullness  No cva tenderness   Musculoskeletal:     Cervical back: Normal range of motion and neck supple.     Right lower leg: No edema.     Left lower leg: No edema.  Lymphadenopathy:     Cervical: No cervical adenopathy.  Skin:    General: Skin is warm and dry.     Coloration: Skin is not jaundiced or pale.     Findings: No bruising or rash.  Neurological:     Mental Status: He is alert.     Coordination: Coordination normal.     Deep Tendon Reflexes: Reflexes are normal and symmetric. Reflexes normal.  Psychiatric:        Mood and Affect: Mood normal.           Assessment & Plan:   Problem List Items Addressed This Visit       Cardiovascular and Mediastinum   Bilateral carotid artery stenosis    Had R  carotid art stent insertion  Reviewed hospital records, lab results and studies in detail  Vasc f/u is planned soon       Relevant Medications   hydrALAZINE (APRESOLINE) 50 MG tablet   lisinopril (ZESTRIL) 20 MG tablet   Essential hypertension    BP: 135/60  Improved from hosp Reviewed hospital records, lab results and studies in detail   Hctz was d/c  due to aki Resumed lisiopril 20 mg  Px hydralazone 50 mg tid prn for syst over 150 Planning f/u with pcp         Relevant Medications   hydrALAZINE (APRESOLINE) 50 MG tablet   lisinopril (ZESTRIL) 20 MG tablet   Other Relevant Orders   Comprehensive metabolic panel (Completed)   CBC with Differential/Platelet (Completed)     Respiratory   Non-small cell lung cancer ([place])    Recent mediastinal adenopathy noted incidental at hospital  Will need f/u  Has pulmonary visit planned now         Endocrine   Type 2 diabetes mellitus with hyperglycemia ([place])    Lab Results  Component Value Date   HGBA1C 8.8 (H) 04/05/2022  Planning f/u with endocrinology soon        Relevant Medications   lisinopril (ZESTRIL) 20 MG tablet     Nervous and Auditory   Encephalopathy    Resolved after tx of uti  Reviewed hospital records, lab results and studies in detail  Cva ruled out  Carotid stenosis was treated  Mag replaced  HTN is in better control  Will plan f/u with pcp         Genitourinary   BPH with obstruction/lower urinary tract symptoms    Urology f/u planned  He was recently hosp with uti causing encephalopathy Continues proscar and flomax  Continues tin in/out cath         UTI (urinary tract infection) - Primary    Reviewed hospital records, lab results and studies in detail  Tx with IV rocephin  Disch with cipro  Did have some ak as well  Clinically resolved For urol f/u for this and BPH Continues to in/out cath

## 2022-04-14 NOTE — Patient Instructions (Addendum)
Make an appt with your urologist   Follow up with your other specialists as planned   Take care of yourself  Keep up a good fluid intake   Continue to monitor blood pressure and sugar   Labs today   If symptoms return please let us knwo

## 2022-04-15 NOTE — Assessment & Plan Note (Signed)
BP: 135/60  Improved from hosp Reviewed hospital records, lab results and studies in detail   Hctz was d/c due to aki Resumed lisiopril 20 mg  Px hydralazone 50 mg tid prn for syst over 150 Planning f/u with pcp

## 2022-04-15 NOTE — Assessment & Plan Note (Signed)
Lab Results  Component Value Date   HGBA1C 8.8 (H) 04/05/2022   Planning f/u with endocrinology soon

## 2022-04-15 NOTE — Assessment & Plan Note (Signed)
Resolved after tx of uti  Reviewed hospital records, lab results and studies in detail  Cva ruled out  Carotid stenosis was treated  Mag replaced  HTN is in better control  Will plan f/u with pcp

## 2022-04-15 NOTE — Assessment & Plan Note (Addendum)
Reviewed hospital records, lab results and studies in detail  Tx with IV rocephin  Disch with cipro  Did have some ak as well  Clinically resolved For urol f/u for this and BPH Continues to in/out cath

## 2022-04-15 NOTE — Assessment & Plan Note (Signed)
Recent mediastinal adenopathy noted incidental at hospital  Will need f/u  Has pulmonary visit planned now

## 2022-04-15 NOTE — Assessment & Plan Note (Addendum)
Urology f/u planned  He was recently hosp with uti causing encephalopathy Continues proscar and flomax  Continues tin in/out cath

## 2022-04-15 NOTE — Assessment & Plan Note (Signed)
Had R carotid art stent insertion  Reviewed hospital records, lab results and studies in detail  Vasc f/u is planned soon

## 2022-04-17 DIAGNOSIS — E1129 Type 2 diabetes mellitus with other diabetic kidney complication: Secondary | ICD-10-CM | POA: Diagnosis not present

## 2022-04-17 DIAGNOSIS — E1159 Type 2 diabetes mellitus with other circulatory complications: Secondary | ICD-10-CM | POA: Diagnosis not present

## 2022-04-17 DIAGNOSIS — E1142 Type 2 diabetes mellitus with diabetic polyneuropathy: Secondary | ICD-10-CM | POA: Diagnosis not present

## 2022-04-17 DIAGNOSIS — R809 Proteinuria, unspecified: Secondary | ICD-10-CM | POA: Diagnosis not present

## 2022-04-17 DIAGNOSIS — Z794 Long term (current) use of insulin: Secondary | ICD-10-CM | POA: Diagnosis not present

## 2022-04-18 ENCOUNTER — Telehealth: Payer: Self-pay | Admitting: Primary Care

## 2022-04-18 NOTE — Telephone Encounter (Signed)
See result note for further documentation.

## 2022-04-18 NOTE — Telephone Encounter (Signed)
Pt called regarding lab results  # [phone]

## 2022-04-19 ENCOUNTER — Other Ambulatory Visit (INDEPENDENT_AMBULATORY_CARE_PROVIDER_SITE_OTHER): Payer: Self-pay | Admitting: Vascular Surgery

## 2022-04-19 DIAGNOSIS — I6523 Occlusion and stenosis of bilateral carotid arteries: Secondary | ICD-10-CM

## 2022-04-19 DIAGNOSIS — I739 Peripheral vascular disease, unspecified: Secondary | ICD-10-CM

## 2022-04-19 NOTE — Progress Notes (Signed)
Patient ID: Antonio [name], male   DOB: Aug 05, 1945, 77 y.o.   MRN: [id number]  No chief complaint on file.   HPI Antonio [name] is a 77 y.o. male.    The patient is seen for follow up evaluation of carotid stenosis status post right carotid stent on 04/07/2022.    Procedure:  Placement of a 9 mm x 7 mm x 30 mm exact stent with the use of the NAV-6 embolic protection device in the right internal carotid artery   There were no post operative problems or complications related to the surgery.  The patient denies neck or incisional pain.  The patient denies interval amaurosis fugax. There is no recent history of TIA symptoms or focal motor deficits. There is no prior documented CVA.  The patient denies headache.  The patient is taking enteric-coated aspirin 81 mg daily.  No recent shortening of the patient's walking distance or new symptoms consistent with claudication.  No history of rest pain symptoms. No new ulcers or wounds of the lower extremities have occurred.  There is no history of DVT, PE or superficial thrombophlebitis. No recent episodes of angina or shortness of breath documented.  Carotid duplex today's RICA 123456 and LICA [id number] successful right stent  ABI's Rt=0.74 and Lt=0.91 (previous ABI's Rt=0.82 and Lt=0.85)   Past Medical History:  Diagnosis Date   Anemia    Arthritis    Asthma    age 41   BPH with urinary obstruction    stable on flomax ([name]   Coronary atherosclerosis of unspecified type of vessel, native or graft    Diabetes mellitus    Type II   Emphysema of lung (HCC)    Esophageal stricture    Gastritis    GERD (gastroesophageal reflux disease)    Hiatal hernia    Hx of colonic polyps    Hyperlipidemia    Hypertension    Internal hemorrhoids without mention of complication    Lumbago    Pain in both lower legs    Pneumonia    PSA elevation    now averaging 2's   Tobacco abuse    URI (upper respiratory infection)    Urinary  retention     Past Surgical History:  Procedure Laterality Date   2D Echo  06/03   Abdominal ultrasound  06/03   Negative   BACK SURGERY     CAROTID PTA/STENT INTERVENTION Right 04/07/2022   Procedure: CAROTID PTA/STENT INTERVENTION;  Surgeon: Katha Cabal, MD;  Location: [facility];  Service: Cardiovascular;  Laterality: Right;   COLONOSCOPY     CT of head  and sinuses  06/03   Negative   CT of the chest, abdomen, pelvis  06/03   Chest negative; Abdomen, left adrenal lesion; Pelvis, enlarged prostate   ESOPHAGOGASTRODUODENOSCOPY  02/13/07   gastritis and duodenitis without bleed   ESOPHAGOGASTRODUODENOSCOPY N/A 06/26/2014   Procedure: ESOPHAGOGASTRODUODENOSCOPY (EGD);  Surgeon: Milus Banister, MD;  Location: [facility];  Service: Endoscopy;  Laterality: N/A;   ESOPHAGOGASTRODUODENOSCOPY (EGD) WITH PROPOFOL N/A 04/08/2020   Procedure: ESOPHAGOGASTRODUODENOSCOPY (EGD) WITH PROPOFOL;  Surgeon: Ladene Artist, MD;  [facility];  Service: Endoscopy;  Laterality: N/A;   HOT HEMOSTASIS N/A 04/08/2020   Procedure: HOT HEMOSTASIS (ARGON PLASMA COAGULATION/BICAP);  Surgeon: Ladene Artist, MD;  Location: [facility];  Service: Endoscopy;  Laterality: N/A;   KNEE ARTHROSCOPY  10/00   Right   LEFT HEART CATH AND CORONARY ANGIOGRAPHY N/A  05/23/2021   Procedure: LEFT HEART CATH AND CORONARY ANGIOGRAPHY;  Surgeon: [name], MD;  Location: [facility];  Service: Cardiovascular;  Laterality: N/A;   LOWER EXTREMITY ANGIOGRAPHY Right 12/27/2017   Procedure: LOWER EXTREMITY ANGIOGRAPHY;  Surgeon: Algernon Huxley, MD;  Location: [facility];  Service: Cardiovascular;  Laterality: Right;   LOWER EXTREMITY ANGIOGRAPHY Left 01/10/2018   Procedure: LOWER EXTREMITY ANGIOGRAPHY;  Surgeon: Algernon Huxley, MD;  Location: [facility];  Service: Cardiovascular;  Laterality: Left;   LOWER EXTREMITY ANGIOGRAPHY Left 05/29/2018   Procedure: LOWER EXTREMITY  ANGIOGRAPHY;  Surgeon: Algernon Huxley, MD;  Location: [facility];  Service: Cardiovascular;  Laterality: Left;   LOWER EXTREMITY ANGIOGRAPHY Left 05/30/2018   Procedure: Lower Extremity Angiography;  Surgeon: Algernon Huxley, MD;  Location: [facility];  Service: Cardiovascular;  Laterality: Left;   LP  06/03   Microwave thermotherapy prostate  08/28/07   [place]fe   Persantine cardiolite  03/01   EF 68%   POLYPECTOMY  11/95   Stress myoview  07/06   EF 67%   UPPER GASTROINTESTINAL ENDOSCOPY        No Known Allergies  Current Outpatient Medications  Medication Sig Dispense Refill   acetaminophen (TYLENOL) 500 MG tablet Take 1,000 mg by mouth every 6 (six) hours as needed (hip pain.). Rapid release     aspirin 81 MG EC tablet Take 81 mg by mouth in the morning.     atorvastatin (LIPITOR) 20 MG tablet TAKE 1 TABLET BY MOUTH IN THE EVENING FOR CHOLESTEROL 90 tablet 1   clopidogrel (PLAVIX) 75 MG tablet TAKE 1 TABLET BY MOUTH EVERY DAY 90 tablet 2   ferrous gluconate (FERGON) 324 MG tablet TAKE 1 TABLET BY MOUTH EVERY DAY WITH BREAKFAST 30 tablet 0   FIBER PO Take 1 capsule by mouth in the morning.     finasteride (PROSCAR) 5 MG tablet TAKE 1 TABLET BY MOUTH EVERY DAY 90 tablet 3   fluticasone (FLONASE) 50 MCG/ACT nasal spray SPRAY 2 SPRAYS INTO EACH NOSTRIL EVERY DAY 16 mL 5   Fluticasone-Umeclidin-Vilant (TRELEGY ELLIPTA) 100-62.5-25 MCG/ACT AEPB Inhale 1 puff into the lungs daily. 28 each 11   glipiZIDE (GLUCOTROL) 10 MG tablet Take 10 mg by mouth daily before supper.      GLUCOSAMINE HCL PO Take 1 tablet by mouth daily.     hydrALAZINE (APRESOLINE) 50 MG tablet Take 1 tablet (50 mg total) by mouth 3 (three) times daily as needed (If systolic BP greater than Q000111Q mmHg.). If systolic BP greater than Q000111Q mmHg. 90 tablet 3   Insulin Glargine (BASAGLAR KWIKPEN) 100 UNIT/ML Inject 30 Units into the skin daily.     lisinopril (ZESTRIL) 20 MG tablet Take 1 tablet (20 mg total) by  mouth daily. 90 tablet 1   metFORMIN (GLUCOPHAGE) 500 MG tablet TAKE 2 TABLETS BY MOUTH TWICE A DAY WITH MEAL FOR DIABETES 360 tablet 2   pantoprazole (PROTONIX) 40 MG tablet Take 1 tablet (40 mg total) by mouth daily. For heartburn 90 tablet 3   sildenafil (VIAGRA) 100 MG tablet Take 1 tablet (100 mg total) by mouth daily as needed for erectile dysfunction. 30 tablet 0   tamsulosin (FLOMAX) 0.4 MG CAPS capsule TAKE 1 CAPSULE BY MOUTH 2 TIMES DAILY. 180 capsule 3   vitamin B-12 (CYANOCOBALAMIN) 1000 MCG tablet Take 1,000 mcg by mouth in the morning.     No current facility-administered medications for  this visit.        Physical Exam There were no vitals taken for this visit. Gen:  WD/WN, NAD Skin: incision C/D/I, no carotid bruit     Assessment/Plan: 1. Bilateral carotid artery stenosis Recommend:  The patient is s/p successful right carotid stent  Duplex ultrasound  shows 1-39%  stenosis.  Continue antiplatelet therapy as prescribed Continue management of CAD, HTN and Hyperlipidemia Healthy heart diet,  encouraged exercise at least 4 times per week  The patient's NIHSS score is as follows: 0 Mild: 1 - 5 Mild to Moderately Severe: 5 - 14 Severe: 15 - 24 Very Severe: >25  Follow up in 6 months with duplex ultrasound and physical exam based on the patient's carotid intervention.      Hortencia Pilar 04/19/2022, 9:55 AM   This note was created with Dragon medical transcription system.  Any errors from dictation are unintentional.

## 2022-04-20 ENCOUNTER — Encounter (INDEPENDENT_AMBULATORY_CARE_PROVIDER_SITE_OTHER): Payer: Self-pay | Admitting: Vascular Surgery

## 2022-04-20 ENCOUNTER — Ambulatory Visit (INDEPENDENT_AMBULATORY_CARE_PROVIDER_SITE_OTHER): Payer: Medicare Other

## 2022-04-20 ENCOUNTER — Ambulatory Visit (INDEPENDENT_AMBULATORY_CARE_PROVIDER_SITE_OTHER): Payer: Medicare Other | Admitting: Vascular Surgery

## 2022-04-20 VITALS — BP 198/79 | Ht 66.0 in | Wt 180.0 lb

## 2022-04-20 DIAGNOSIS — I6523 Occlusion and stenosis of bilateral carotid arteries: Secondary | ICD-10-CM

## 2022-04-20 DIAGNOSIS — I739 Peripheral vascular disease, unspecified: Secondary | ICD-10-CM

## 2022-04-20 MED ORDER — CLOPIDOGREL BISULFATE 75 MG PO TABS: 75.0000 mg | ORAL_TABLET | Freq: Every day | ORAL | 3 refills | Status: DC

## 2022-04-22 ENCOUNTER — Encounter (INDEPENDENT_AMBULATORY_CARE_PROVIDER_SITE_OTHER): Payer: Self-pay | Admitting: Vascular Surgery

## 2022-04-24 ENCOUNTER — Telehealth: Payer: Self-pay | Admitting: Primary Care

## 2022-04-24 NOTE — Telephone Encounter (Signed)
Called and advised patient he should have one refill left at the pharmacy for both requested medications. He will let us know if he has any issues getting these filled.

## 2022-04-24 NOTE — Telephone Encounter (Signed)
Prescription Request  04/24/2022  LOV: 12/21/2021  What is the name of the medication or equipment?  atorvastatin (LIPITOR) 20 MG tablet pantoprazole (PROTONIX) 40 MG tablet    Have you contacted your pharmacy to request a refill? Yes   Which pharmacy would you like this sent to?  [id number], [place] [address] [place]  [address] Phone: [phone] Fax: [phone]    Patient notified that their request is being sent to the clinical staff for review and that they should receive a response within 2 business days.   Please advise at [facility] [phone]

## 2022-04-25 NOTE — Progress Notes (Unsigned)
3:06 PM   Antonio [name] 05/14/1945 [id number]  Referring provider: Pleas Koch, NP [address],  [place] [address]  Chief Complaint  Patient presents with   Benign Prostatic Hypertrophy   Urological history 1.  Urinary retention -Contributing factors of age, excessive alcohol consumption, BPH and diabetes -tamsulosin 0.4 mg 2 capsules daily and finasteride 5 mg daily  2. BPH with LU TS -PSA (06/2021) 0.2 -TARGIS > 10 years ago -cysto 2019 -very large prostate with significant lateral lobe hypertrophy trophy in the median lobe with intravesicular protrusion -tamsulosin 0.4 mg 2 capsules daily and finasteride 5 mg daily   HPI: Antonio [name] is a 77 y.o. male who presents today for follow up after hospitalization with his SO, Butch Penny.   UA while hospitalized noted  6-10 RBC's.    I PSS 14/2  PVR 0 mL   He has no urinary complaints at this time.  He has not needed to cath himself for some time.  Patient denies any modifying or aggravating factors.  Patient denies any gross hematuria, dysuria or suprapubic/flank pain.  Patient denies any fevers, chills, nausea or vomiting.     IPSS     Row Name 04/26/22 1400         International Prostate Symptom Score   How often have you had the sensation of not emptying your bladder? Less than half the time     How often have you had to urinate less than every two hours? Less than half the time     How often have you found you stopped and started again several times when you urinated? Less than half the time     How often have you found it difficult to postpone urination? Less than half the time     How often have you had a weak urinary stream? Less than half the time     How often have you had to strain to start urination? Less than half the time     How many times did you typically get up at night to urinate? 2 Times     Total IPSS Score 14       Quality of Life due to urinary symptoms   If you were to spend the  rest of your life with your urinary condition just the way it is now how would you feel about that? Mostly Satisfied              Score:  1-7 Mild 8-19 Moderate 20-35 Severe   PMH: Past Medical History:  Diagnosis Date   Anemia    Arthritis    Asthma    age 64   BPH with urinary obstruction    stable on flomax ([name]   Coronary atherosclerosis of unspecified type of vessel, native or graft    Diabetes mellitus    Type II   Emphysema of lung (HCC)    Esophageal stricture    Gastritis    GERD (gastroesophageal reflux disease)    Hiatal hernia    Hx of colonic polyps    Hyperlipidemia    Hypertension    Internal hemorrhoids without mention of complication    Lumbago    Pain in both lower legs    Pneumonia    PSA elevation    now averaging 2's   Tobacco abuse    URI (upper respiratory infection)    Urinary retention     Surgical History: Past Surgical History:  Procedure Laterality Date   2D Echo  06/03   Abdominal ultrasound  06/03   Negative   BACK SURGERY     CAROTID PTA/STENT INTERVENTION Right 04/07/2022   Procedure: CAROTID PTA/STENT INTERVENTION;  Surgeon: Katha Cabal, MD;  Location: [facility];  Service: Cardiovascular;  Laterality: Right;   COLONOSCOPY     CT of head  and sinuses  06/03   Negative   CT of the chest, abdomen, pelvis  06/03   Chest negative; Abdomen, left adrenal lesion; Pelvis, enlarged prostate   ESOPHAGOGASTRODUODENOSCOPY  02/13/07   gastritis and duodenitis without bleed   ESOPHAGOGASTRODUODENOSCOPY N/A 06/26/2014   Procedure: ESOPHAGOGASTRODUODENOSCOPY (EGD);  Surgeon: Milus Banister, MD;  Location: [facility];  Service: Endoscopy;  Laterality: N/A;   ESOPHAGOGASTRODUODENOSCOPY (EGD) WITH PROPOFOL N/A 04/08/2020   Procedure: ESOPHAGOGASTRODUODENOSCOPY (EGD) WITH PROPOFOL;  Surgeon: Ladene Artist, MD;  [facility];  Service: Endoscopy;  Laterality: N/A;   HOT HEMOSTASIS N/A 04/08/2020   Procedure:  HOT HEMOSTASIS (ARGON PLASMA COAGULATION/BICAP);  Surgeon: Ladene Artist, MD;  [facility];  Service: Endoscopy;  Laterality: N/A;   KNEE ARTHROSCOPY  10/00   Right   LEFT HEART CATH AND CORONARY ANGIOGRAPHY N/A 05/23/2021   Procedure: LEFT HEART CATH AND CORONARY ANGIOGRAPHY;  Surgeon: [name], MD;  Location: [facility];  Service: Cardiovascular;  Laterality: N/A;   LOWER EXTREMITY ANGIOGRAPHY Right 12/27/2017   Procedure: LOWER EXTREMITY ANGIOGRAPHY;  Surgeon: Algernon Huxley, MD;  Location: [facility];  Service: Cardiovascular;  Laterality: Right;   LOWER EXTREMITY ANGIOGRAPHY Left 01/10/2018   Procedure: LOWER EXTREMITY ANGIOGRAPHY;  Surgeon: Algernon Huxley, MD;  Location: [facility];  Service: Cardiovascular;  Laterality: Left;   LOWER EXTREMITY ANGIOGRAPHY Left 05/29/2018   Procedure: LOWER EXTREMITY ANGIOGRAPHY;  Surgeon: Algernon Huxley, MD;  Location: [facility];  Service: Cardiovascular;  Laterality: Left;   LOWER EXTREMITY ANGIOGRAPHY Left 05/30/2018   Procedure: Lower Extremity Angiography;  Surgeon: Algernon Huxley, MD;  Location: [facility];  Service: Cardiovascular;  Laterality: Left;   LP  06/03   Microwave thermotherapy prostate  08/28/07   [place]fe   Persantine cardiolite  03/01   EF 68%   POLYPECTOMY  11/95   Stress myoview  07/06   EF 67%   UPPER GASTROINTESTINAL ENDOSCOPY      Home Medications:  Allergies as of 04/26/2022   No Known Allergies      Medication List        Accurate as of April 26, 2022  3:06 PM. If you have any questions, ask your nurse or doctor.          acetaminophen 500 MG tablet Commonly known as: TYLENOL Take 1,000 mg by mouth every 6 (six) hours as needed (hip pain.). Rapid release   aspirin EC 81 MG tablet Take 81 mg by mouth in the morning.   atorvastatin 20 MG tablet Commonly known as: LIPITOR TAKE 1 TABLET BY MOUTH IN THE EVENING FOR CHOLESTEROL   Basaglar KwikPen 100  UNIT/ML Inject 30 Units into the skin daily.   clopidogrel 75 MG tablet Commonly known as: PLAVIX Take 1 tablet (75 mg total) by mouth daily.   cyanocobalamin 1000 MCG tablet Commonly known as: VITAMIN B12 Take 1,000 mcg by mouth in the morning.   ferrous gluconate 324 MG tablet Commonly known as: FERGON TAKE 1 TABLET BY MOUTH EVERY DAY WITH BREAKFAST  FIBER PO Take 1 capsule by mouth in the morning.   finasteride 5 MG tablet Commonly known as: PROSCAR Take 1 tablet (5 mg total) by mouth daily.   fluticasone 50 MCG/ACT nasal spray Commonly known as: FLONASE SPRAY 2 SPRAYS INTO EACH NOSTRIL EVERY DAY   glipiZIDE 10 MG tablet Commonly known as: GLUCOTROL Take 10 mg by mouth daily before supper.   GLUCOSAMINE HCL PO Take 1 tablet by mouth daily.   hydrALAZINE 50 MG tablet Commonly known as: APRESOLINE Take 1 tablet (50 mg total) by mouth 3 (three) times daily as needed (If systolic BP greater than Q000111Q mmHg.). If systolic BP greater than Q000111Q mmHg.   lisinopril 20 MG tablet Commonly known as: ZESTRIL Take 1 tablet (20 mg total) by mouth daily.   metFORMIN 500 MG tablet Commonly known as: GLUCOPHAGE TAKE 2 TABLETS BY MOUTH TWICE A DAY WITH MEAL FOR DIABETES   pantoprazole 40 MG tablet Commonly known as: PROTONIX Take 1 tablet (40 mg total) by mouth daily. For heartburn   sildenafil 100 MG tablet Commonly known as: Viagra Take 1 tablet (100 mg total) by mouth daily as needed for erectile dysfunction.   tamsulosin 0.4 MG Caps capsule Commonly known as: FLOMAX Take 1 capsule (0.4 mg total) by mouth 2 (two) times daily.   Trelegy Ellipta 100-62.5-25 MCG/ACT Aepb Generic drug: Fluticasone-Umeclidin-Vilant Inhale 1 puff into the lungs daily.        Allergies: No Known Allergies  Family History: Family History  Problem Relation Age of Onset   Rheum arthritis Mother    Diabetes Father    Alcohol abuse Paternal Uncle    Alcohol abuse Paternal Uncle     Alcohol abuse Paternal Uncle    Heart disease Neg Hx    Stroke Neg Hx    Cancer Neg Hx    Drug abuse Neg Hx    Depression Neg Hx    Colon cancer Neg Hx    Rectal cancer Neg Hx    Stomach cancer Neg Hx    Kidney cancer Neg Hx    Bladder Cancer Neg Hx    Prostate cancer Neg Hx     Social History:  reports that he quit smoking about a year ago. His smoking use included cigarettes. He started smoking about 58 years ago. He has a 114.00 pack-year smoking history. He has quit using smokeless tobacco.  His smokeless tobacco use included chew. He reports that he does not currently use alcohol. He reports that he does not use drugs.  ROS: For pertinent review of systems please refer to history of present illness  Physical Exam: Blood pressure (!) 189/51, pulse 89, height 5\' 6"  (1.676 m), weight 180 lb (81.6 kg).  Constitutional:  Well nourished. Alert and oriented, No acute distress. HEENT: [place] AT, moist mucus membranes.  Trachea midline Cardiovascular: No clubbing, cyanosis, or edema. Respiratory: Normal respiratory effort, no increased work of breathing. Neurologic: Grossly intact, no focal deficits, moving all 4 extremities. Psychiatric: Normal mood and affect.   Laboratory Data: Serum creatinine (04/2022) 0.89 Hemoglobin A1c (03/2022) 8.8 I have reviewed the labs.  Pertinent Imaging:  04/26/22 14:09  Scan Result 0    Assessment & Plan:   1. BPH with LU TS -Continue tamsulosin 0.4 mg 2 tablets daily and finasteride 5 mg daily  2.  Microscopic hematuria -return in 2 months for recheck on UA    Return in about 2 months (around 06/26/2022) for UA only .  Zeric Baranowski, PA-C  [facility] [address] [place], [place] [address] [phone]

## 2022-04-26 ENCOUNTER — Ambulatory Visit (INDEPENDENT_AMBULATORY_CARE_PROVIDER_SITE_OTHER): Payer: Medicare Other | Admitting: Urology

## 2022-04-26 ENCOUNTER — Encounter: Payer: Self-pay | Admitting: Urology

## 2022-04-26 VITALS — BP 189/51 | HR 89 | Ht 66.0 in | Wt 180.0 lb

## 2022-04-26 DIAGNOSIS — N529 Male erectile dysfunction, unspecified: Secondary | ICD-10-CM | POA: Diagnosis not present

## 2022-04-26 DIAGNOSIS — R3129 Other microscopic hematuria: Secondary | ICD-10-CM

## 2022-04-26 DIAGNOSIS — N401 Enlarged prostate with lower urinary tract symptoms: Secondary | ICD-10-CM | POA: Diagnosis not present

## 2022-04-26 DIAGNOSIS — N138 Other obstructive and reflux uropathy: Secondary | ICD-10-CM

## 2022-04-26 LAB — BLADDER SCAN AMB NON-IMAGING: Scan Result: 0

## 2022-04-26 MED ORDER — FINASTERIDE 5 MG PO TABS: 5.0000 mg | ORAL_TABLET | Freq: Every day | ORAL | 3 refills | Status: DC

## 2022-04-26 MED ORDER — TAMSULOSIN HCL 0.4 MG PO CAPS: 0.4000 mg | ORAL_CAPSULE | Freq: Two times a day (BID) | ORAL | 3 refills | Status: DC

## 2022-05-01 DIAGNOSIS — H6123 Impacted cerumen, bilateral: Secondary | ICD-10-CM | POA: Diagnosis not present

## 2022-05-01 DIAGNOSIS — H902 Conductive hearing loss, unspecified: Secondary | ICD-10-CM | POA: Diagnosis not present

## 2022-05-01 LAB — VAS US ABI WITH/WO TBI
Left ABI: 0.91
Right ABI: 0.74

## 2022-05-02 ENCOUNTER — Telehealth: Payer: Self-pay | Admitting: Primary Care

## 2022-05-02 NOTE — Telephone Encounter (Signed)
Prescription Request  05/02/2022  LOV: 12/21/2021  What is the name of the medication or equipment?  atorvastatin (LIPITOR) 20 MG tablet  Have you contacted your pharmacy to request a refill? No   Which pharmacy would you like this sent to?  [id number], [place]  [address] Phone: [phone] Fax: [phone]    Patient notified that their request is being sent to the clinical staff for review and that they should receive a response within 2 business days.   Please advise at Mobile [phone] (mobile)

## 2022-05-02 NOTE — Telephone Encounter (Signed)
Called patient and advised pharmacy should have 1 refill on file. He will contact them.

## 2022-05-09 ENCOUNTER — Telehealth: Payer: Self-pay

## 2022-05-09 NOTE — Progress Notes (Signed)
Care Management & Coordination Services Pharmacy Team  Reason for Encounter: Hypertension Disease State  Contacted patient to discuss hypertension disease state.  Unsuccessful outreach. Left voicemail for patient to return call.   Current antihypertensive regimen:  Lisinopril-HCTZ 20-25 mg daily    Adherence Review: Is the patient currently on ACE/ARB medication? Yes Does the patient have >5 day gap between last estimated fill dates? No  Star Rating Drugs:  Medication:  Last Fill: Day Supply Atorvastatin 20 mg 05/02/2022 90 Glipizide 10 mg 04/26/2022 90 Lisinopril 20 mg 04/26/2022 90 Metformin 500 mg 04/26/2022 90  Chart Updates: Recent office visits:  04/14/22 Roxy MannsMarne Tower, MD UTI Abnormal Labs "Labs look stable. Protein is mildly low-may be due to recent illness -will watch this" No med changes  Recent consult visits:  04/26/22 Michiel CowboyShannon McGowan, PA (Urology) BPH with obstruction/lower UTI symptoms Ordered: Bladder scan No med changes F/U 2 months 04/20/22 Levora DredgeGregory Schnier, MD (Vascular Surgery) Ordered: VAS US ABI and VAS US Carotid Result: Duplex ultrasound  shows 1-39%  stenosis. F/U 6 months with duplex ultrasound and physical exam  04/17/22 Carlena SaxAnna Solum, MD (Endo) DM No med changes F/U 2-3 months 04/13/22 Sherryl MangesSteven Klein, MD (Cardiology) Bradycardia Ordered: EKG F/U 6 months  Hospital visits:  Medication Reconciliation was completed by comparing discharge summary, patient's EMR and Pharmacy list, and upon discussion with patient.  Admitted to the hospital on 04/05/2022 due to Encephalopathy. Discharge date was 04/08/2022. [facility]    04/07/22 Carotid PTA/Stent Intervention  [facility]?? CIPRO 500 MG tablet  APRESOLINE 50 MG tablet  ZESTRIL 20 MG tablet   Medication Changes at Hospital Discharge: None noted  Medications Discontinued at Hospital Discharge: lisinopril-hydrochlorothiazide 20-25 MG  tablet  Medications that remain the same after Hospital Discharge:??  -All other medications will remain the same.    Medications: Outpatient Encounter Medications as of 05/09/2022  Medication Sig   acetaminophen (TYLENOL) 500 MG tablet Take 1,000 mg by mouth every 6 (six) hours as needed (hip pain.). Rapid release   aspirin 81 MG EC tablet Take 81 mg by mouth in the morning.   atorvastatin (LIPITOR) 20 MG tablet TAKE 1 TABLET BY MOUTH IN THE EVENING FOR CHOLESTEROL   clopidogrel (PLAVIX) 75 MG tablet Take 1 tablet (75 mg total) by mouth daily.   ferrous gluconate (FERGON) 324 MG tablet TAKE 1 TABLET BY MOUTH EVERY DAY WITH BREAKFAST   FIBER PO Take 1 capsule by mouth in the morning.   finasteride (PROSCAR) 5 MG tablet Take 1 tablet (5 mg total) by mouth daily.   fluticasone (FLONASE) 50 MCG/ACT nasal spray SPRAY 2 SPRAYS INTO EACH NOSTRIL EVERY DAY   Fluticasone-Umeclidin-Vilant (TRELEGY ELLIPTA) 100-62.5-25 MCG/ACT AEPB Inhale 1 puff into the lungs daily.   glipiZIDE (GLUCOTROL) 10 MG tablet Take 10 mg by mouth daily before supper.    GLUCOSAMINE HCL PO Take 1 tablet by mouth daily.   hydrALAZINE (APRESOLINE) 50 MG tablet Take 1 tablet (50 mg total) by mouth 3 (three) times daily as needed (If systolic BP greater than 150 mmHg.). If systolic BP greater than 150 mmHg.   Insulin Glargine (BASAGLAR KWIKPEN) 100 UNIT/ML Inject 30 Units into the skin daily.   lisinopril (ZESTRIL) 20 MG tablet Take 1 tablet (20 mg total) by mouth daily.   metFORMIN (GLUCOPHAGE) 500 MG tablet TAKE 2 TABLETS BY MOUTH TWICE A DAY WITH MEAL FOR DIABETES   pantoprazole (PROTONIX) 40 MG tablet Take 1 tablet (40 mg total) by  mouth daily. For heartburn   sildenafil (VIAGRA) 100 MG tablet Take 1 tablet (100 mg total) by mouth daily as needed for erectile dysfunction.   tamsulosin (FLOMAX) 0.4 MG CAPS capsule Take 1 capsule (0.4 mg total) by mouth 2 (two) times daily.   vitamin B-12 (CYANOCOBALAMIN) 1000 MCG tablet Take  1,000 mcg by mouth in the morning.   No facility-administered encounter medications on file as of 05/09/2022.    Recent Office Vitals: BP Readings from Last 3 Encounters:  04/26/22 (!) 189/51  04/20/22 (!) 198/79  04/14/22 135/60   Pulse Readings from Last 3 Encounters:  04/26/22 89  04/14/22 71  04/13/22 (!) 52    Wt Readings from Last 3 Encounters:  04/26/22 180 lb (81.6 kg)  04/20/22 180 lb (81.6 kg)  04/14/22 182 lb (82.6 kg)     Kidney Function Lab Results  Component Value Date/Time   CREATININE 0.89 04/14/2022 09:33 AM   CREATININE 0.83 04/08/2022 06:34 AM   GFR 82.78 04/14/2022 09:33 AM   GFRNONAA >60 04/08/2022 06:34 AM   GFRAA >60 05/31/2018 05:29 AM       Latest Ref Rng & Units 04/14/2022    9:33 AM 04/08/2022    6:34 AM 04/07/2022    4:07 AM  BMP  Glucose 70 - 99 mg/dL 119  147  829   BUN 6 - 23 mg/dL 15  17  33   Creatinine 0.40 - 1.50 mg/dL 5.62  1.30  8.65   Sodium 135 - 145 mEq/L 140  138  140   Potassium 3.5 - 5.1 mEq/L 4.4  3.5  3.5   Chloride 96 - 112 mEq/L 105  107  107   CO2 19 - 32 mEq/L 28  22  24    Calcium 8.4 - 10.5 mg/dL 9.0  8.2  8.1     Al Corpus, PharmD notified  Claudina Lick, [place] Clinical Pharmacy Assistant [phone]

## 2022-05-11 ENCOUNTER — Ambulatory Visit
Admission: RE | Admit: 2022-05-11 | Discharge: 2022-05-11 | Disposition: A | Discharge status: Home / Self Care | Payer: Medicare Other | Source: Ambulatory Visit | Attending: Radiation Oncology | Admitting: Radiation Oncology

## 2022-05-11 ENCOUNTER — Telehealth: Payer: Self-pay | Admitting: *Deleted

## 2022-05-11 DIAGNOSIS — J9811 Atelectasis: Secondary | ICD-10-CM | POA: Diagnosis not present

## 2022-05-11 DIAGNOSIS — C3490 Malignant neoplasm of unspecified part of unspecified bronchus or lung: Secondary | ICD-10-CM | POA: Diagnosis not present

## 2022-05-11 DIAGNOSIS — R911 Solitary pulmonary nodule: Secondary | ICD-10-CM | POA: Insufficient documentation

## 2022-05-11 MED ORDER — IOHEXOL 300 MG/ML  SOLN
75.0000 mL | Freq: Once | INTRAMUSCULAR | Status: AC | PRN
  Administered 2022-05-11: 75 mL via INTRAVENOUS

## 2022-05-11 NOTE — Telephone Encounter (Signed)
CAlled report  IMPRESSION: Overall increasing areas of disease with increasing left-sided mediastinal and hilar nodes as well as tracking soft tissue along the left main bronchus.   Associated areas of bronchial narrowing towards the lingula with developing atelectasis.   Persistent wall thickening along the esophagus with a hiatal hernia. Please correlate with any symptoms.   Findings will be referred to the ordering service by the Radiology physician assistant team   Aortic Atherosclerosis (ICD10-I70.0).     Electronically Signed   By: Jill Side M.D.   On: 05/11/2022 16:25

## 2022-05-12 ENCOUNTER — Ambulatory Visit (INDEPENDENT_AMBULATORY_CARE_PROVIDER_SITE_OTHER): Payer: Medicare Other | Admitting: Podiatry

## 2022-05-12 DIAGNOSIS — M79674 Pain in right toe(s): Secondary | ICD-10-CM

## 2022-05-12 DIAGNOSIS — E119 Type 2 diabetes mellitus without complications: Secondary | ICD-10-CM | POA: Diagnosis not present

## 2022-05-12 DIAGNOSIS — M79675 Pain in left toe(s): Secondary | ICD-10-CM | POA: Diagnosis not present

## 2022-05-12 DIAGNOSIS — B351 Tinea unguium: Secondary | ICD-10-CM

## 2022-05-12 NOTE — Progress Notes (Signed)
    Chief Complaint  Patient presents with   Diabetes    Patient came in today for Diabetic foot care, A1c- 6.5  BG- 138, nail trim     SUBJECTIVE Patient with a history of diabetes mellitus presents to office today complaining of elongated, thickened nails that cause pain while ambulating in shoes.  Patient is unable to trim their own nails. Patient is here for further evaluation and treatment.  Past Medical History:  Diagnosis Date   Anemia    Arthritis    Asthma    age 77   BPH with urinary obstruction    stable on flomax ([name]   Coronary atherosclerosis of unspecified type of vessel, native or graft    Diabetes mellitus    Type II   Emphysema of lung    Esophageal stricture    Gastritis    GERD (gastroesophageal reflux disease)    Hiatal hernia    Hx of colonic polyps    Hyperlipidemia    Hypertension    Internal hemorrhoids without mention of complication    Lumbago    Pain in both lower legs    Pneumonia    PSA elevation    now averaging 2's   Tobacco abuse    URI (upper respiratory infection)    Urinary retention     No Known Allergies   OBJECTIVE General Patient is awake, alert, and oriented x 3 and in no acute distress. Derm Skin is dry and supple bilateral. Negative open lesions or macerations. Remaining integument unremarkable. Nails are tender, long, thickened and dystrophic with subungual debris, consistent with onychomycosis, 1-5 bilateral. No signs of infection noted. Vasc  DP and PT pedal pulses palpable bilaterally. Temperature gradient within normal limits.  Neuro Epicritic and protective threshold sensation diminished bilaterally.  Musculoskeletal Exam No symptomatic pedal deformities noted bilateral. Muscular strength within normal limits.  ASSESSMENT 1. Diabetes Mellitus w/ peripheral neuropathy 2.  Pain due to onychomycosis of toenails bilateral 3.  Encounter for diabetic foot exam  PLAN OF CARE 1. Patient evaluated today.   Comprehensive diabetic foot exam performed today 2. Instructed to maintain good pedal hygiene and foot care. Stressed importance of controlling blood sugar.  3. Mechanical debridement of nails 1-5 bilaterally performed using a nail nipper. Filed with dremel without incident.  4. Return to clinic in 3 mos.     Felecia Shelling, DPM [facility]  Dr. Felecia Shelling, DPM    [address] [place], [place] [address]                Office [phone]  Fax [phone]

## 2022-05-16 ENCOUNTER — Other Ambulatory Visit: Payer: Self-pay | Admitting: *Deleted

## 2022-05-16 ENCOUNTER — Ambulatory Visit
Admission: RE | Admit: 2022-05-16 | Discharge: 2022-05-16 | Disposition: A | Discharge status: Home / Self Care | Payer: Medicare Other | Source: Ambulatory Visit | Attending: Radiation Oncology | Admitting: Radiation Oncology

## 2022-05-16 ENCOUNTER — Encounter (INDEPENDENT_AMBULATORY_CARE_PROVIDER_SITE_OTHER): Payer: Medicare Other

## 2022-05-16 ENCOUNTER — Encounter: Payer: Self-pay | Admitting: Radiation Oncology

## 2022-05-16 ENCOUNTER — Ambulatory Visit (INDEPENDENT_AMBULATORY_CARE_PROVIDER_SITE_OTHER): Payer: Medicare Other | Admitting: Nurse Practitioner

## 2022-05-16 VITALS — BP 178/82 | HR 78 | Temp 98.2°F | Resp 20 | Ht 67.0 in | Wt 176.0 lb

## 2022-05-16 DIAGNOSIS — Z923 Personal history of irradiation: Secondary | ICD-10-CM | POA: Insufficient documentation

## 2022-05-16 DIAGNOSIS — C3412 Malignant neoplasm of upper lobe, left bronchus or lung: Secondary | ICD-10-CM | POA: Insufficient documentation

## 2022-05-16 DIAGNOSIS — Z87891 Personal history of nicotine dependence: Secondary | ICD-10-CM | POA: Diagnosis not present

## 2022-05-16 DIAGNOSIS — R911 Solitary pulmonary nodule: Secondary | ICD-10-CM

## 2022-05-16 DIAGNOSIS — C3490 Malignant neoplasm of unspecified part of unspecified bronchus or lung: Secondary | ICD-10-CM

## 2022-05-16 NOTE — Progress Notes (Signed)
Radiation Oncology Follow up Note  Name: Antonio [name]   Date:   05/16/2022 MRN:  [id number] DOB: April 01, 1945    This 77 y.o. male presents to the clinic today for 29-month follow-up status post SBRT to his left upper lobe for stage I non-small cell lung cancer.  REFERRING PROVIDER: Doreene Nest, NP  HPI: Patient is a 77 year old male now out 10 months having completed SBRT to his left upper lobe for stage I non-small cell lung cancer.  He clinically is doing well specifically [place] cough hemoptysis chest tightness or any change in his pulmonary status..  He had a recent CT scan showing overall increased areas of disease with increasing left-sided mediastinal hilar adenopathy and some tracking of soft tissue along the left mainstem bronchus.  COMPLICATIONS OF TREATMENT: none  FOLLOW UP COMPLIANCE: keeps appointments   PHYSICAL EXAM:  BP (!) 178/82 Comment: recheck patient to contact PCP if BP remains high  Pulse 78   Temp 98.2 F (36.8 C) (Tympanic)   Resp 20   Ht 5\' 7"  (1.702 m)   Wt 176 lb (79.8 kg)   BMI 27.57 kg/m  Well-developed well-nourished patient in NAD. HEENT reveals PERLA, EOMI, discs not visualized.  Oral cavity is clear. No oral mucosal lesions are identified. Neck is clear without evidence of cervical or supraclavicular adenopathy. Lungs are clear to A&P. Cardiac examination is essentially unremarkable with regular rate and rhythm without murmur rub or thrill. Abdomen is benign with no organomegaly or masses noted. Motor sensory and DTR levels are equal and symmetric in the upper and lower extremities. Cranial nerves II through XII are grossly intact. Proprioception is intact. No peripheral adenopathy or edema is identified. No motor or sensory levels are noted. Crude visual fields are within normal range.  RADIOLOGY RESULTS: CT scan reviewed PET CT scan ordered  PLAN: At this time of ordered a PET CT scan for better clarification of exactly what has progressed over  time.  The area of SBRT is completely resolved.  Should there show hypermetabolic activity would refer for possible biopsy as well as referral to medical oncology.  Patient comprehends my recommendations well.  Will see him shortly after his PET CT scan.  I would like to take this opportunity to thank you for allowing me to participate in the care of your patient.Carmina Miller, MD

## 2022-05-18 ENCOUNTER — Ambulatory Visit: Payer: Medicare Other | Admitting: Radiation Oncology

## 2022-05-22 ENCOUNTER — Encounter: Payer: Self-pay | Admitting: Nurse Practitioner

## 2022-05-22 ENCOUNTER — Ambulatory Visit (INDEPENDENT_AMBULATORY_CARE_PROVIDER_SITE_OTHER)
Admission: RE | Admit: 2022-05-22 | Discharge: 2022-05-22 | Disposition: A | Discharge status: Home / Self Care | Payer: Medicare Other | Source: Ambulatory Visit | Attending: Nurse Practitioner | Admitting: Nurse Practitioner

## 2022-05-22 ENCOUNTER — Ambulatory Visit (INDEPENDENT_AMBULATORY_CARE_PROVIDER_SITE_OTHER): Payer: Medicare Other | Admitting: Nurse Practitioner

## 2022-05-22 VITALS — BP 150/82 | HR 48 | Temp 97.6°F | Resp 16 | Ht 67.0 in | Wt 184.2 lb

## 2022-05-22 DIAGNOSIS — M7989 Other specified soft tissue disorders: Secondary | ICD-10-CM | POA: Diagnosis not present

## 2022-05-22 DIAGNOSIS — W19XXXA Unspecified fall, initial encounter: Secondary | ICD-10-CM

## 2022-05-22 DIAGNOSIS — T148XXA Other injury of unspecified body region, initial encounter: Secondary | ICD-10-CM

## 2022-05-22 DIAGNOSIS — M79604 Pain in right leg: Secondary | ICD-10-CM | POA: Insufficient documentation

## 2022-05-22 DIAGNOSIS — S8011XA Contusion of right lower leg, initial encounter: Secondary | ICD-10-CM

## 2022-05-22 HISTORY — DX: Other injury of unspecified body region, initial encounter: T14.8XXA

## 2022-05-22 NOTE — Assessment & Plan Note (Signed)
Secondary to mechanical fall.  Obtain tib-fib films of right lower extremity

## 2022-05-22 NOTE — Assessment & Plan Note (Signed)
Patient had a mechanical fall at home striking his right side.  Did not hit his head denies LOC.  Happened approximately 2 weeks ago per patient and spouse's report

## 2022-05-22 NOTE — Patient Instructions (Signed)
Nice to see you today Continue using ice as needed, use the bleu emu cream and voltaren gel as needed. Keep it elevated when you are at home resting This will likely take another 2-4 weeks to resolve If it opens up starts draining or redness comes on and spreads let us know

## 2022-05-22 NOTE — Assessment & Plan Note (Signed)
Collection of fluid to the right lower lateral leg.  No signs of bruising likely hematoma will obtain plain film.  Continue use ice over-the-counter medications such as blue emu and Voltaren gel.  Elevate as able neurovascularly intact.

## 2022-05-22 NOTE — Progress Notes (Signed)
   Acute Office Visit  Subjective:     Patient ID: Antonio [name], male    DOB: 03-29-1945, 77 y.o.   MRN: [id number]  Chief Complaint  Patient presents with   Leg Pain    Right leg knot Larey Seat 2 weeks ago     Patient is in today for leg pain post fall with a history of PAD, ischemic leg, HTN, Lung cancer, DM2, CVA, IDA, tobacco abuse  Patient fell approx 2 weeks ago. States that he got out of the [place]. States that he tripped and hit a quad cane. States that he did not hit his head and did not hurt anything out. Denies loc.   States that he did try ice on it and helped with the swelling. States that he has heating pad and bleu emu cream      Review of Systems  Constitutional:  Negative for chills and fever.  Neurological:  Negative for tingling.        Objective:    BP (!) 150/82   Pulse (!) 48   Temp 97.6 F (36.4 C)   Resp 16   Ht 5\' 7"  (1.702 m)   Wt 184 lb 4 oz (83.6 kg)   SpO2 99%   BMI 28.86 kg/m    Physical Exam Constitutional:      Appearance: Normal appearance.     Comments: Walks with a cane in office   Cardiovascular:     Rate and Rhythm: Regular rhythm. Bradycardia present.     Pulses:          Popliteal pulses are 1+ on the right side.       Dorsalis pedis pulses are 2+ on the right side and 2+ on the left side.     Heart sounds: Normal heart sounds.  Pulmonary:     Effort: Pulmonary effort is normal.     Breath sounds: Normal breath sounds.  Musculoskeletal:     Right lower leg: 1+ Pitting Edema present.     Left lower leg: 1+ Pitting Edema present.       Legs:  Neurological:     Mental Status: He is alert.     No results found for any visits on 05/22/22.      Assessment & Plan:   Problem List Items Addressed This Visit       Other   Fall - Primary    Patient had a mechanical fall at home striking his right side.  Did not hit his head denies LOC.  Happened approximately 2 weeks ago per patient and spouse's report       Relevant Orders   DG Tibia/Fibula Right   Hematoma    Collection of fluid to the right lower lateral leg.  No signs of bruising likely hematoma will obtain plain film.  Continue use ice over-the-counter medications such as blue emu and Voltaren gel.  Elevate as able neurovascularly intact.      Right leg pain    Secondary to mechanical fall.  Obtain tib-fib films of right lower extremity       No orders of the defined types were placed in this encounter.   Return if symptoms worsen or fail to improve.  Audria Nine, NP

## 2022-05-30 ENCOUNTER — Ambulatory Visit
Admission: RE | Admit: 2022-05-30 | Discharge: 2022-05-30 | Disposition: A | Discharge status: Home / Self Care | Payer: Medicare Other | Source: Ambulatory Visit | Attending: Radiation Oncology | Admitting: Radiation Oncology

## 2022-05-30 DIAGNOSIS — K449 Diaphragmatic hernia without obstruction or gangrene: Secondary | ICD-10-CM | POA: Diagnosis not present

## 2022-05-30 DIAGNOSIS — N400 Enlarged prostate without lower urinary tract symptoms: Secondary | ICD-10-CM | POA: Insufficient documentation

## 2022-05-30 DIAGNOSIS — I700 Atherosclerosis of aorta: Secondary | ICD-10-CM | POA: Diagnosis not present

## 2022-05-30 DIAGNOSIS — C3490 Malignant neoplasm of unspecified part of unspecified bronchus or lung: Secondary | ICD-10-CM

## 2022-05-30 DIAGNOSIS — Z923 Personal history of irradiation: Secondary | ICD-10-CM | POA: Diagnosis not present

## 2022-05-30 DIAGNOSIS — C3412 Malignant neoplasm of upper lobe, left bronchus or lung: Secondary | ICD-10-CM | POA: Diagnosis not present

## 2022-05-30 LAB — GLUCOSE, CAPILLARY: Glucose-Capillary: 121 mg/dL — ABNORMAL HIGH (ref 70–99)

## 2022-05-30 MED ORDER — FLUDEOXYGLUCOSE F - 18 (FDG) INJECTION
10.2800 | Freq: Once | INTRAVENOUS | Status: AC | PRN
  Administered 2022-05-30: 10.28 via INTRAVENOUS

## 2022-05-31 ENCOUNTER — Other Ambulatory Visit: Payer: Self-pay | Admitting: Primary Care

## 2022-05-31 ENCOUNTER — Telehealth: Payer: Self-pay | Admitting: Primary Care

## 2022-05-31 DIAGNOSIS — K2100 Gastro-esophageal reflux disease with esophagitis, without bleeding: Secondary | ICD-10-CM

## 2022-05-31 NOTE — Telephone Encounter (Signed)
Prescription Request  05/31/2022  LOV: 12/21/2021  What is the name of the medication or equipment?  hydrALAZINE (APRESOLINE) 50 MG tablet  Have you contacted your pharmacy to request a refill? No   Which pharmacy would you like this sent to?  [id number]ann Sheen, [place] - [address] [place] [address] Phone: [phone] Fax: [phone]    Patient notified that their request is being sent to the clinical staff for review and that they should receive a response within 2 business days.   Please advise at Mobile [phone] (mobile)

## 2022-05-31 NOTE — Telephone Encounter (Signed)
Patient was given #90 w 30 refills on 04/14/22. He will contact pharmacy for refills.

## 2022-06-08 ENCOUNTER — Encounter: Payer: Self-pay | Admitting: *Deleted

## 2022-06-08 ENCOUNTER — Encounter: Payer: Self-pay | Admitting: Radiation Oncology

## 2022-06-08 ENCOUNTER — Ambulatory Visit
Admission: RE | Admit: 2022-06-08 | Discharge: 2022-06-08 | Disposition: A | Discharge status: Home / Self Care | Payer: Medicare Other | Source: Ambulatory Visit | Attending: Radiation Oncology | Admitting: Radiation Oncology

## 2022-06-08 VITALS — BP 178/76 | Temp 97.4°F | Resp 16 | Ht 67.0 in | Wt 185.0 lb

## 2022-06-08 DIAGNOSIS — C3490 Malignant neoplasm of unspecified part of unspecified bronchus or lung: Secondary | ICD-10-CM | POA: Insufficient documentation

## 2022-06-08 DIAGNOSIS — C3412 Malignant neoplasm of upper lobe, left bronchus or lung: Secondary | ICD-10-CM | POA: Diagnosis not present

## 2022-06-08 DIAGNOSIS — Z87891 Personal history of nicotine dependence: Secondary | ICD-10-CM | POA: Diagnosis not present

## 2022-06-08 NOTE — Progress Notes (Signed)
Radiation Oncology Follow up Note  Name: Antonio [name]   Date:   06/08/2022 MRN:  [id number] DOB: April 04, 1945    This 77 y.o. male presents to the clinic today for follow-up after PET CT scan has been performed and patient now out 11 months having completed SBRT to his left upper lobe for stage I non-small cell lung cancer.  REFERRING PROVIDER: Doreene Nest, NP  HPI: Patient is a 77 year old male previously treated back 1 year prior for a stage I non-small cell lung cancer of his left upper lobe.  He had a recent CT scan showing increased areas of disease with increased left-sided mediastinal hilar adenopathy and some tracking of soft tissue along the left mainstem bronchus.  I ordered a PET CT scan confirming 2 new hypermetabolic left paratracheal mediastinal nodal metastasis.  There is no residual focal hypermetabolic activity the site of treated left upper lobe pulmonary nodule.  No extrathoracic or skeletal hypermetabolic disease noted.  He is fairly asymptomatic specifically denies hemoptysis cough or bone pain.  His pulmonary status is stable.  COMPLICATIONS OF TREATMENT: none  FOLLOW UP COMPLIANCE: keeps appointments   PHYSICAL EXAM:  BP (!) 178/76 Comment: Pt stated that BP would be high, his PCP is aware  Temp (!) 97.4 F (36.3 C)   Resp 16   Ht 5\' 7"  (1.702 m)   Wt 185 lb (83.9 kg)   BMI 28.98 kg/m  Well-developed well-nourished patient in NAD. HEENT reveals PERLA, EOMI, discs not visualized.  Oral cavity is clear. No oral mucosal lesions are identified. Neck is clear without evidence of cervical or supraclavicular adenopathy. Lungs are clear to A&P. Cardiac examination is essentially unremarkable with regular rate and rhythm without murmur rub or thrill. Abdomen is benign with no organomegaly or masses noted. Motor sensory and DTR levels are equal and symmetric in the upper and lower extremities. Cranial nerves II through XII are grossly intact. Proprioception is intact. No  peripheral adenopathy or edema is identified. No motor or sensory levels are noted. Crude visual fields are within normal range.  RADIOLOGY RESULTS: CT scan and PET scan reviewed compatible with above-stated findings  PLAN: Present time I am referring the patient to Dr. Jayme Cloud for possible bronchoscopy and biopsy.  I am also setting up a follow-up appointment with myself in 3 to [redacted] weeks along with medical oncology.  Will make clinically determinations based on pathology findings.  If this is not able to undergo bronchoscopy may elect empirically to go ahead with concurrent chemoradiation therapy.  Patient comprehends my recommendations well.  Appointments were arranged.  I would like to take this opportunity to thank you for allowing me to participate in the care of your patient.Carmina Miller, MD

## 2022-06-08 NOTE — Progress Notes (Signed)
Referral received to help coordinate referrals and appts for further work up of abnormal PET scan. Will continue to follow to ensure pt is scheduled for pulmonary evaluation with Dr. Jayme Cloud and med-oncology consultation after bronch if needed. Referrals have been placed at this time.

## 2022-06-12 ENCOUNTER — Telehealth: Payer: Self-pay

## 2022-06-12 ENCOUNTER — Ambulatory Visit (INDEPENDENT_AMBULATORY_CARE_PROVIDER_SITE_OTHER): Payer: Medicare Other | Admitting: Pulmonary Disease

## 2022-06-12 ENCOUNTER — Encounter: Payer: Self-pay | Admitting: Pulmonary Disease

## 2022-06-12 VITALS — BP 148/84 | HR 66 | Temp 97.3°F | Ht 67.0 in | Wt 183.4 lb

## 2022-06-12 DIAGNOSIS — R590 Localized enlarged lymph nodes: Secondary | ICD-10-CM

## 2022-06-12 DIAGNOSIS — C3412 Malignant neoplasm of upper lobe, left bronchus or lung: Secondary | ICD-10-CM

## 2022-06-12 DIAGNOSIS — J449 Chronic obstructive pulmonary disease, unspecified: Secondary | ICD-10-CM | POA: Diagnosis not present

## 2022-06-12 MED ORDER — TRELEGY ELLIPTA 100-62.5-25 MCG/ACT IN AEPB: 1.0000 | INHALATION_SPRAY | Freq: Every day | RESPIRATORY_TRACT | 11 refills | Status: DC

## 2022-06-12 MED ORDER — TRELEGY ELLIPTA 100-62.5-25 MCG/ACT IN AEPB: 1.0000 | INHALATION_SPRAY | Freq: Every day | RESPIRATORY_TRACT | 0 refills | Status: DC

## 2022-06-12 NOTE — Patient Instructions (Addendum)
We are scheduling your biopsy for 13 May at 10 AM in the morning.  You will need a driver for that day.  You should arrive at least 1 hour prior to the same-day surgery area in the main hospital campus [facility]).  You will need to stop taking your Plavix at least 5 days prior to the procedure.  We will see you in follow-up in 4 to 6 weeks time however, we will stay in contact with you before during and after the procedure.

## 2022-06-12 NOTE — Progress Notes (Signed)
Subjective:    Patient ID: Antonio [name], male    DOB: 1945-04-15, 77 y.o.   MRN: [id number] Patient Care Team: Doreene Nest, NP as PCP - General (Internal Medicine) Debbe Odea, MD as PCP - Cardiology (Cardiology) Duke Salvia, MD as PCP - Electrophysiology (Cardiology) Hulan Fray as Physician Assistant (Urology) Meryl Dare, MD as Consulting Physician (Gastroenterology) Kathyrn Sheriff, [facility] as Pharmacist (Pharmacist) Glory Buff, RN as Oncology Nurse Navigator  Chief Complaint  Patient presents with   Follow-up    Nodule- PET 05/30/2022.  No SOB or wheezing. Cough with sputum.    HPI This is a 77 year old former smoker (half PPD, 114 PY) who presents for consideration of repeat bronchoscopy at the request of Dr. Carmina Miller.  I last saw this patient on 04 November 2021.  Previously he had had a left perihilar lung nodule found during routine lung cancer screening.  He underwent robotic assisted bronchoscopy on 18 May 2021.  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  He had surveillance CT on 11 May 2022 following his SBRT treatment.  There were areas of thickening of tissue along the left mainstem bronchus and some narrowing of the lingula bronchus.  A PET/CT was performed on 23 April that showed a high left paratracheal node with increased SUV as well as a low left paratracheal node with a max SUV of 7.5.  He has not had any orthopnea or paroxysmal nocturnal dyspnea.  No chest pain, no lower extremity edema nor calf tenderness.  He notes some shortness of breath with walking up inclines or picking up heavy objects but this has been better since he is on Trelegy.  He ran out of Trelegy 2 to 3 days ago and wants a refill. He does not note wheezing since he has been on inhalers.  During his September visit PFTs were ordered however he never got these done.  He does not endorse any other active  symptomatology today.  Recall he is on Plavix and aspirin due to prior vascular bypass.  He has been able to stop these in the past for procedures.  Review of Systems A 10 point review of systems was performed and it is as noted above otherwise negative.  Patient Active Problem List   Diagnosis Date Noted   Hematoma 05/22/2022   Right leg pain 05/22/2022   Bilateral carotid artery stenosis 04/06/2022   Stroke-like symptoms 04/06/2022   Encephalopathy 04/05/2022   UTI (urinary tract infection) 04/05/2022   History of CVA (cerebrovascular accident) 04/05/2022   Fatigue 12/21/2021   Hearing loss of right ear due to cerumen impaction 10/05/2021   Non-small cell lung cancer (HCC) 06/19/2021   Aortic atherosclerosis (HCC) 06/19/2021   Atrioventricular block, Mobitz type 1, Wenckebach 05/25/2021   Complete heart block (HCC) 05/20/2021   Gastric AVM    Iron deficiency anemia    Right buttock pain 01/12/2020   Fall 01/12/2020   Rash and nonspecific skin eruption 12/19/2019   Chronic pain of right hip 10/17/2019   Ischemic leg 05/29/2018   B12 deficiency 04/03/2018   PAD (peripheral artery disease) (HCC) 12/12/2017   Pain in limb 12/11/2017   GERD (gastroesophageal reflux disease) 04/30/2017   BPH with obstruction/lower urinary tract symptoms 10/14/2014   Essential hypertension 06/26/2014   Type 2 diabetes mellitus with hyperglycemia (HCC) 06/26/2014   HLD (hyperlipidemia) 06/27/2006   Coronary atherosclerosis 06/27/2006   Tobacco abuse 06/27/2006   Social  History   Tobacco Use   Smoking status: Former    Packs/day: 2.00    Years: 57.00    Additional pack years: 0.00    Total pack years: 114.00    Types: Cigarettes    Start date: 02/07/1964    Quit date: 05/11/2021    Years since quitting: 1.0   Smokeless tobacco: Former    Types: Chew  Substance Use Topics   Alcohol use: Not Currently    Alcohol/week: 0.0 standard drinks of alcohol    Comment: rare   No Known  Allergies  Current Meds  Medication Sig   acetaminophen (TYLENOL) 500 MG tablet Take 1,000 mg by mouth every 6 (six) hours as needed (hip pain.). Rapid release   aspirin 81 MG EC tablet Take 81 mg by mouth in the morning.   atorvastatin (LIPITOR) 20 MG tablet TAKE 1 TABLET BY MOUTH IN THE EVENING FOR CHOLESTEROL   clopidogrel (PLAVIX) 75 MG tablet Take 1 tablet (75 mg total) by mouth daily.   ferrous gluconate (FERGON) 324 MG tablet TAKE 1 TABLET BY MOUTH EVERY DAY WITH BREAKFAST   FIBER PO Take 1 capsule by mouth in the morning.   finasteride (PROSCAR) 5 MG tablet Take 1 tablet (5 mg total) by mouth daily.   fluticasone (FLONASE) 50 MCG/ACT nasal spray SPRAY 2 SPRAYS INTO EACH NOSTRIL EVERY DAY   Fluticasone-Umeclidin-Vilant (TRELEGY ELLIPTA) 100-62.5-25 MCG/ACT AEPB Inhale 1 puff into the lungs daily.   glipiZIDE (GLUCOTROL) 10 MG tablet Take 10 mg by mouth daily before supper.    GLUCOSAMINE HCL PO Take 1 tablet by mouth daily.   hydrALAZINE (APRESOLINE) 50 MG tablet Take 1 tablet (50 mg total) by mouth 3 (three) times daily as needed (If systolic BP greater than 150 mmHg.). If systolic BP greater than 150 mmHg.   Insulin Glargine (BASAGLAR KWIKPEN) 100 UNIT/ML Inject 30 Units into the skin daily.   lisinopril (ZESTRIL) 20 MG tablet Take 1 tablet (20 mg total) by mouth daily.   metFORMIN (GLUCOPHAGE) 500 MG tablet TAKE 2 TABLETS BY MOUTH TWICE A DAY WITH MEAL FOR DIABETES   pantoprazole (PROTONIX) 40 MG tablet TAKE 1 TABLET (40 MG TOTAL) BY MOUTH DAILY. FOR HEARTBURN   sildenafil (VIAGRA) 100 MG tablet Take 1 tablet (100 mg total) by mouth daily as needed for erectile dysfunction.   tamsulosin (FLOMAX) 0.4 MG CAPS capsule Take 1 capsule (0.4 mg total) by mouth 2 (two) times daily.   vitamin B-12 (CYANOCOBALAMIN) 1000 MCG tablet Take 1,000 mcg by mouth in the morning.       Objective:   Physical Exam BP (!) 148/84 (BP Location: Left Arm, Cuff Size: Normal)   Pulse 66   Temp (!) 97.3  F (36.3 C)   Ht 5\' 7"  (1.702 m)   Wt 183 lb 6.4 oz (83.2 kg)   SpO2 97%   BMI 28.72 kg/m   SpO2: 97 % O2 Device: None (Room air)  GENERAL: Well-developed, well-nourished gentleman, no acute distress, fully ambulatory. HEAD: Normocephalic, atraumatic.  EYES: Pupils equal, round, reactive to light.  No scleral icterus.  MOUTH: Poor dentition, no thrush. NECK: Supple. No thyromegaly. Trachea midline. No JVD.  No adenopathy. PULMONARY: Good air entry bilaterally.  No adventitious sounds. CARDIOVASCULAR: S1 and S2. Regular rate and rhythm.  No rubs, murmurs or gallops heard. ABDOMEN: Benign. MUSCULOSKELETAL: No joint deformity, no clubbing, no edema.  NEUROLOGIC: No overt focal deficit, no gait disturbance, speech is fluent. SKIN: Intact,warm,dry.  On limited exam  no rashes PSYCH: Mood and behavior normal.  Representative images of the CT chest performed 11 May 2018 24th and PET/CT performed 30 May 2022 showing the areas in question that are suspicious for metastatic disease:     Assessment & Plan:     ICD-10-CM   1. Mediastinal adenopathy  R59.0    Appears to be metastatic disease Patient will need bronchoscopy + EBUS/TBNA    2. Malignant neoplasm of upper lobe of left lung (HCC)  C34.12    Non-small cell carcinoma left hilar area Status post SBRT    3. Chronic obstructive pulmonary disease, unspecified COPD type (HCC)  J44.9    Resume Trelegy 100 Patient will need PFTs     Meds ordered this encounter  Medications   Fluticasone-Umeclidin-Vilant (TRELEGY ELLIPTA) 100-62.5-25 MCG/ACT AEPB    Sig: Inhale 1 puff into the lungs daily.    Dispense:  28 each    Refill:  11   Fluticasone-Umeclidin-Vilant (TRELEGY ELLIPTA) 100-62.5-25 MCG/ACT AEPB    Sig: Inhale 1 puff into the lungs daily.    Dispense:  14 each    Refill:  0    Order Specific Question:   Lot Number?    Answer:   [id number]    Order Specific Question:   Expiration Date?    Answer:   09/07/2023    Order  Specific Question:   Quantity    Answer:   1   Patient will be scheduled for bronchoscopy on 19 Jun 2022 at 10 AM in the morning.  He will have to hold Plavix and aspirin 5 days prior to the procedure.  Will see him in follow-up in 4 to 6 weeks time however we will stay in contact with him before during and after the procedure.  Gailen Shelter, MD Advanced Bronchoscopy PCCM [facility]    *This note was dictated using voice recognition software/Dragon.  Despite best efforts to proofread, errors can occur which can change the meaning. Any transcriptional errors that result from this process are unintentional and may not be fully corrected at the time of dictation.

## 2022-06-12 NOTE — Telephone Encounter (Signed)
Flexible Bronch with EBUS 06/19/2022 at 10:00am Lung Nodule [id number]  Synetta Fail please see bronch info.

## 2022-06-12 NOTE — H&P (View-Only) (Signed)
 Subjective:    Patient ID: Antonio [name], male    DOB: 01/02/1946, 77 y.o.   MRN: [id number] Patient Care Team: Clark, Katherine K, NP as PCP - General (Internal Medicine) Agbor-Etang, Brian, MD as PCP - Cardiology (Cardiology) Klein, Steven C, MD as PCP - Electrophysiology (Cardiology) McGowan, Shannon A, PA-C as Physician Assistant (Urology) Stark, Malcolm T, MD as Consulting Physician (Gastroenterology) Foltanski, Lindsey N, RPH as Pharmacist (Pharmacist) Rhode, Hayley, RN as Oncology Nurse Navigator  Chief Complaint  Patient presents with   Follow-up    Nodule- PET 05/30/2022.  No SOB or wheezing. Cough with sputum.    HPI This is a 77-year-old former smoker (half PPD, 114 PY) who presents for consideration of repeat bronchoscopy at the request of Dr. Glenn Chrystal.  I last saw this patient on 04 November 2021.  Previously he had had a left perihilar lung nodule found during routine lung cancer screening.  He underwent robotic assisted bronchoscopy on 18 May 2021.  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  He had surveillance CT on 11 May 2022 following his SBRT treatment.  There were areas of thickening of tissue along the left mainstem bronchus and some narrowing of the lingula bronchus.  A PET/CT was performed on 23 April that showed a high left paratracheal node with increased SUV as well as a low left paratracheal node with a max SUV of 7.5.  He has not had any orthopnea or paroxysmal nocturnal dyspnea.  No chest pain, no lower extremity edema nor calf tenderness.  He notes some shortness of breath with walking up inclines or picking up heavy objects but this has been better since he is on Trelegy.  He ran out of Trelegy 2 to 3 days ago and wants a refill. He does not note wheezing since he has been on inhalers.  During his September visit PFTs were ordered however he never got these done.  He does not endorse any other active  symptomatology today.  Recall he is on Plavix and aspirin due to prior vascular bypass.  He has been able to stop these in the past for procedures.  Review of Systems A 10 point review of systems was performed and it is as noted above otherwise negative.  Patient Active Problem List   Diagnosis Date Noted   Hematoma 05/22/2022   Right leg pain 05/22/2022   Bilateral carotid artery stenosis 04/06/2022   Stroke-like symptoms 04/06/2022   Encephalopathy 04/05/2022   UTI (urinary tract infection) 04/05/2022   History of CVA (cerebrovascular accident) 04/05/2022   Fatigue 12/21/2021   Hearing loss of right ear due to cerumen impaction 10/05/2021   Non-small cell lung cancer (HCC) 06/19/2021   Aortic atherosclerosis (HCC) 06/19/2021   Atrioventricular block, Mobitz type 1, Wenckebach 05/25/2021   Complete heart block (HCC) 05/20/2021   Gastric AVM    Iron deficiency anemia    Right buttock pain 01/12/2020   Fall 01/12/2020   Rash and nonspecific skin eruption 12/19/2019   Chronic pain of right hip 10/17/2019   Ischemic leg 05/29/2018   B12 deficiency 04/03/2018   PAD (peripheral artery disease) (HCC) 12/12/2017   Pain in limb 12/11/2017   GERD (gastroesophageal reflux disease) 04/30/2017   BPH with obstruction/lower urinary tract symptoms 10/14/2014   Essential hypertension 06/26/2014   Type 2 diabetes mellitus with hyperglycemia (HCC) 06/26/2014   HLD (hyperlipidemia) 06/27/2006   Coronary atherosclerosis 06/27/2006   Tobacco abuse 06/27/2006   Social   History   Tobacco Use   Smoking status: Former    Packs/day: 2.00    Years: 57.00    Additional pack years: 0.00    Total pack years: 114.00    Types: Cigarettes    Start date: 02/07/1964    Quit date: 05/11/2021    Years since quitting: 1.0   Smokeless tobacco: Former    Types: Chew  Substance Use Topics   Alcohol use: Not Currently    Alcohol/week: 0.0 standard drinks of alcohol    Comment: rare   No Known  Allergies  Current Meds  Medication Sig   acetaminophen (TYLENOL) 500 MG tablet Take 1,000 mg by mouth every 6 (six) hours as needed (hip pain.). Rapid release   aspirin 81 MG EC tablet Take 81 mg by mouth in the morning.   atorvastatin (LIPITOR) 20 MG tablet TAKE 1 TABLET BY MOUTH IN THE EVENING FOR CHOLESTEROL   clopidogrel (PLAVIX) 75 MG tablet Take 1 tablet (75 mg total) by mouth daily.   ferrous gluconate (FERGON) 324 MG tablet TAKE 1 TABLET BY MOUTH EVERY DAY WITH BREAKFAST   FIBER PO Take 1 capsule by mouth in the morning.   finasteride (PROSCAR) 5 MG tablet Take 1 tablet (5 mg total) by mouth daily.   fluticasone (FLONASE) 50 MCG/ACT nasal spray SPRAY 2 SPRAYS INTO EACH NOSTRIL EVERY DAY   Fluticasone-Umeclidin-Vilant (TRELEGY ELLIPTA) 100-62.5-25 MCG/ACT AEPB Inhale 1 puff into the lungs daily.   glipiZIDE (GLUCOTROL) 10 MG tablet Take 10 mg by mouth daily before supper.    GLUCOSAMINE HCL PO Take 1 tablet by mouth daily.   hydrALAZINE (APRESOLINE) 50 MG tablet Take 1 tablet (50 mg total) by mouth 3 (three) times daily as needed (If systolic BP greater than 150 mmHg.). If systolic BP greater than 150 mmHg.   Insulin Glargine (BASAGLAR KWIKPEN) 100 UNIT/ML Inject 30 Units into the skin daily.   lisinopril (ZESTRIL) 20 MG tablet Take 1 tablet (20 mg total) by mouth daily.   metFORMIN (GLUCOPHAGE) 500 MG tablet TAKE 2 TABLETS BY MOUTH TWICE A DAY WITH MEAL FOR DIABETES   pantoprazole (PROTONIX) 40 MG tablet TAKE 1 TABLET (40 MG TOTAL) BY MOUTH DAILY. FOR HEARTBURN   sildenafil (VIAGRA) 100 MG tablet Take 1 tablet (100 mg total) by mouth daily as needed for erectile dysfunction.   tamsulosin (FLOMAX) 0.4 MG CAPS capsule Take 1 capsule (0.4 mg total) by mouth 2 (two) times daily.   vitamin B-12 (CYANOCOBALAMIN) 1000 MCG tablet Take 1,000 mcg by mouth in the morning.       Objective:   Physical Exam BP (!) 148/84 (BP Location: Left Arm, Cuff Size: Normal)   Pulse 66   Temp (!) 97.3  F (36.3 C)   Ht 5' 7" (1.702 m)   Wt 183 lb 6.4 oz (83.2 kg)   SpO2 97%   BMI 28.72 kg/m   SpO2: 97 % O2 Device: None (Room air)  GENERAL: Well-developed, well-nourished gentleman, no acute distress, fully ambulatory. HEAD: Normocephalic, atraumatic.  EYES: Pupils equal, round, reactive to light.  No scleral icterus.  MOUTH: Poor dentition, no thrush. NECK: Supple. No thyromegaly. Trachea midline. No JVD.  No adenopathy. PULMONARY: Good air entry bilaterally.  No adventitious sounds. CARDIOVASCULAR: S1 and S2. Regular rate and rhythm.  No rubs, murmurs or gallops heard. ABDOMEN: Benign. MUSCULOSKELETAL: No joint deformity, no clubbing, no edema.  NEUROLOGIC: No overt focal deficit, no gait disturbance, speech is fluent. SKIN: Intact,warm,dry.  On limited exam   no rashes PSYCH: Mood and behavior normal.  Representative images of the CT chest performed 11 May 2018 24th and PET/CT performed 30 May 2022 showing the areas in question that are suspicious for metastatic disease:     Assessment & Plan:     ICD-10-CM   1. Mediastinal adenopathy  R59.0    Appears to be metastatic disease Patient will need bronchoscopy + EBUS/TBNA    2. Malignant neoplasm of upper lobe of left lung (HCC)  C34.12    Non-small cell carcinoma left hilar area Status post SBRT    3. Chronic obstructive pulmonary disease, unspecified COPD type (HCC)  J44.9    Resume Trelegy 100 Patient will need PFTs     Meds ordered this encounter  Medications   Fluticasone-Umeclidin-Vilant (TRELEGY ELLIPTA) 100-62.5-25 MCG/ACT AEPB    Sig: Inhale 1 puff into the lungs daily.    Dispense:  28 each    Refill:  11   Fluticasone-Umeclidin-Vilant (TRELEGY ELLIPTA) 100-62.5-25 MCG/ACT AEPB    Sig: Inhale 1 puff into the lungs daily.    Dispense:  14 each    Refill:  0    Order Specific Question:   Lot Number?    Answer:   [id number]    Order Specific Question:   Expiration Date?    Answer:   09/07/2023    Order  Specific Question:   Quantity    Answer:   1   Patient will be scheduled for bronchoscopy on 19 Jun 2022 at 10 AM in the morning.  He will have to hold Plavix and aspirin 5 days prior to the procedure.  Will see him in follow-up in 4 to 6 weeks time however we will stay in contact with him before during and after the procedure.  C. Laura Lesley Galentine, MD Advanced Bronchoscopy PCCM [facility]    *This note was dictated using voice recognition software/Dragon.  Despite best efforts to proofread, errors can occur which can change the meaning. Any transcriptional errors that result from this process are unintentional and may not be fully corrected at the time of dictation.   

## 2022-06-13 ENCOUNTER — Other Ambulatory Visit: Payer: Medicare Other

## 2022-06-13 NOTE — Telephone Encounter (Signed)
Patient has Medicare A & B and BCBS Supplement Prior Auth Not Required

## 2022-06-13 NOTE — Telephone Encounter (Signed)
Pre-Admit- 06/15/2022 1-5pm Covid Test- 06/16/2022 8-12pm  I have notified the patient. Nothing further needed.

## 2022-06-15 ENCOUNTER — Encounter
Admission: RE | Admit: 2022-06-15 | Discharge: 2022-06-15 | Disposition: A | Discharge status: Home / Self Care | Payer: Medicare Other | Source: Ambulatory Visit | Attending: Pulmonary Disease | Admitting: Pulmonary Disease

## 2022-06-15 ENCOUNTER — Telehealth: Payer: Self-pay | Admitting: *Deleted

## 2022-06-15 DIAGNOSIS — Z01818 Encounter for other preprocedural examination: Secondary | ICD-10-CM

## 2022-06-15 HISTORY — DX: Atrioventricular block, second degree: I44.1

## 2022-06-15 HISTORY — DX: Atherosclerosis of aorta: I70.0

## 2022-06-15 HISTORY — DX: Unspecified fall, initial encounter: W19.XXXA

## 2022-06-15 HISTORY — DX: Atrioventricular block, complete: I44.2

## 2022-06-15 HISTORY — DX: Personal history of Methicillin resistant Staphylococcus aureus infection: Z86.14

## 2022-06-15 HISTORY — DX: Occlusion and stenosis of bilateral carotid arteries: I65.23

## 2022-06-15 HISTORY — DX: Other disorder of circulatory system: I99.8

## 2022-06-15 HISTORY — DX: Malignant neoplasm of unspecified part of unspecified bronchus or lung: C34.90

## 2022-06-15 HISTORY — DX: Type 2 diabetes mellitus without complications: E11.9

## 2022-06-15 HISTORY — DX: Personal history of transient ischemic attack (TIA), and cerebral infarction without residual deficits: Z86.73

## 2022-06-15 HISTORY — DX: Angiodysplasia of stomach and duodenum without bleeding: K31.819

## 2022-06-15 NOTE — Telephone Encounter (Signed)
S/w pt is having surgery Monday.  Placed pt on Dr. Merita Norton schedule for tomorrow.

## 2022-06-15 NOTE — Patient Instructions (Addendum)
Your procedure is scheduled on:06-19-22 Monday Report to the Registration Desk on the 1st floor of the [facility].Then proceed to the 2nd floor Surgery Desk To find out your arrival time, please call [phone] between 1PM - 3PM on:06-16-22 Friday If your arrival time is 6:00 am, do not arrive before that time as the [facility] entrance doors do not open until 6:00 am.  REMEMBER: Instructions that are not followed completely may result in serious medical risk, up to and including death; or upon the discretion of your surgeon and anesthesiologist your surgery may need to be rescheduled.  Do not eat food OR drink any liquids after midnight the night before surgery.  No gum chewing or hard candies.  One week prior to surgery: Stop Anti-inflammatories (NSAIDS) such as Advil, Aleve, Ibuprofen, Motrin, Naproxen, Naprosyn and Aspirin based products such as Excedrin, Goody's Powder, BC Powder.You may however, continue to take Tylenol if needed for pain up until the day of surgery. Stop ANY OVER THE COUNTER supplements/vitamins NOW (06-15-22)until after surgery (Glucosamine and Vitamin B12)-Continue your ferrous gluconate (FERGON) up until the day prior to surgery  Last dose of Aspirin and clopidogrel (PLAVIX) was on 06-13-22 Tuesday as instructed by Dr Jayme Cloud  Stop your metFORMIN (GLUCOPHAGE) 2 days prior to surgery-Last dose will be on 06-16-22 Friday  Stop your sildenafil (VIAGRA) 2 days prior to surgery-Last dose will be on 06-16-22 Friday  Do NOT take any Insulin the morning of your surgery  TAKE ONLY THESE MEDICATIONS THE MORNING OF SURGERY WITH A SIP OF WATER: -atorvastatin (LIPITOR)  -finasteride (PROSCAR)  -hydrALAZINE (APRESOLINE)-take this if your blood pressure meets parameters of when you need to take this -tamsulosin (FLOMAX)  -pantoprazole (PROTONIX)-take one the night before and one on the morning of surgery - helps to prevent nausea after surgery.)  Continue your lisinopril  (ZESTRIL) up until the day prior to surgery-Do NOT take the morning of your surgery  Use your Fluticasone-Umeclidin-Vilant (TRELEGY ELLIPTA) Inhaler the morning of surgery  No Alcohol for 24 hours before or after surgery.  No Smoking including e-cigarettes for 24 hours before surgery.  No chewable tobacco products for at least 6 hours before surgery.  No nicotine patches on the day of surgery.  Do not use any "recreational" drugs for at least a week (preferably 2 weeks) before your surgery.  Please be advised that the combination of cocaine and anesthesia may have negative outcomes, up to and including death. If you test positive for cocaine, your surgery will be cancelled.  On the morning of surgery brush your teeth with toothpaste and water, you may rinse your mouth with mouthwash if you wish. Do not swallow any toothpaste or mouthwash.  Do not wear jewelry, make-up, hairpins, clips or nail polish.  Do not wear lotions, powders, or perfumes.   Do not shave body hair from the neck down 48 hours before surgery.  Contact lenses, hearing aids and dentures may not be worn into surgery.  Do not bring valuables to the hospital. [facility] is not responsible for any missing/lost belongings or valuables.   Notify your doctor if there is any change in your medical condition (cold, fever, infection).  Wear comfortable clothing (specific to your surgery type) to the hospital.  After surgery, you can help prevent lung complications by doing breathing exercises.  Take deep breaths and cough every 1-2 hours. Your doctor may order a device called an Incentive Spirometer to help you take deep breaths. When coughing or sneezing,  hold a pillow firmly against your incision with both hands. This is called "splinting." Doing this helps protect your incision. It also decreases belly discomfort.  If you are being admitted to the hospital overnight, leave your suitcase in the car. After surgery it may  be brought to your room.  In case of increased patient census, it may be necessary for you, the patient, to continue your postoperative care in the Same Day Surgery department.  If you are being discharged the day of surgery, you will not be allowed to drive home. You will need a responsible individual to drive you home and stay with you for 24 hours after surgery.   If you are taking public transportation, you will need to have a responsible individual with you.  Please call the Pre-admissions Testing Dept. at [phone] if you have any questions about these instructions.  Surgery Visitation Policy:  Patients having surgery or a procedure may have two visitors.  Children under the age of 76 must have an adult with them who is not the patient.

## 2022-06-15 NOTE — Telephone Encounter (Signed)
-----   Message from Verlee Monte, NP sent at 06/14/2022  4:56 PM EDT ----- Regarding: Request for pre-operative cardiac clearance Request for pre-operative cardiac clearance:  1. What type of surgery is being performed?  FLEXIBLE BRONCHOSCOPY; ENDOBRONCHIAL ULTRASOUND    2. When is this surgery scheduled?  06/19/2022  3. Type of clearance being requested (medical, pharmacy, both)? BOTH   4. Are there any medications that need to be held prior to surgery? DAPT (ASA + CLOPIDOGREL)  5. Practice name and name of physician performing surgery?  Performing surgeon: Dr. Sarina Ser, MD Requesting clearance: Quentin Mulling, FNP-C    6. Anesthesia type (none, local, MAC, general)? GENERAL  7. What is the office phone and fax number?   Fax: [phone]  ATTENTION: Unable to create telephone message as per your standard workflow. Directed by [facility] providers to send requests for cardiac clearance to this pool for appropriate distribution to provider covering pre-operative clearances.   Quentin Mulling, MSN, APRN, FNP-C, CEN [facility]  Peri-operative Services Nurse Practitioner Phone: [phone] 06/14/22 4:56 PM

## 2022-06-15 NOTE — Telephone Encounter (Signed)
   Name: Antonio [name]  DOB: September 21, 1945  MRN: [id number]  Primary Cardiologist: Debbe Odea, MD  Chart reviewed as part of pre-operative protocol coverage. Because of Otniel Pheng Massengale's past medical history and time since last visit, he will require a follow-up in-office visit in order to better assess preoperative cardiovascular risk.  Pre-op covering staff: - Please schedule appointment and call patient to inform them. If patient already had an upcoming appointment within acceptable timeframe, please add "pre-op clearance" to the appointment notes so provider is aware. - Please contact requesting surgeon's office via preferred method (i.e, phone, fax) to inform them of need for appointment prior to surgery.  Patient is on DAPT for nonobstructive CAD.  If asymptomatic at the time of office visit can hold Plavix x 5 days and ASA x 7 days prior to procedure.  Please restart medically safe to use  Sharlene Dory, PA-C  06/15/2022, 9:51 AM

## 2022-06-16 ENCOUNTER — Encounter: Payer: Self-pay | Admitting: Pulmonary Disease

## 2022-06-16 ENCOUNTER — Encounter: Payer: Self-pay | Admitting: Cardiology

## 2022-06-16 ENCOUNTER — Other Ambulatory Visit: Payer: Medicare Other

## 2022-06-16 ENCOUNTER — Ambulatory Visit: Payer: Medicare Other | Attending: Cardiology | Admitting: Cardiology

## 2022-06-16 VITALS — BP 182/94 | HR 64 | Ht 67.0 in | Wt 181.8 lb

## 2022-06-16 DIAGNOSIS — I10 Essential (primary) hypertension: Secondary | ICD-10-CM | POA: Diagnosis not present

## 2022-06-16 DIAGNOSIS — Z01810 Encounter for preprocedural cardiovascular examination: Secondary | ICD-10-CM | POA: Insufficient documentation

## 2022-06-16 DIAGNOSIS — I441 Atrioventricular block, second degree: Secondary | ICD-10-CM | POA: Diagnosis not present

## 2022-06-16 DIAGNOSIS — I2510 Atherosclerotic heart disease of native coronary artery without angina pectoris: Secondary | ICD-10-CM | POA: Diagnosis not present

## 2022-06-16 NOTE — Progress Notes (Addendum)
Perioperative / Anesthesia Services  Pre-Admission Testing Clinical Review / Preoperative Anesthesia Consult  Date: 06/16/22  Patient Demographics:  Name: Antonio [name] DOB:   05-09-1945 MRN:   [id number]  Planned Surgical Procedure(s):    Case: [id number] Date/Time: 06/19/22 0945   Procedures:      FLEXIBLE BRONCHOSCOPY (Left)     ENDOBRONCHIAL ULTRASOUND (Left)   Anesthesia type: General   Pre-op diagnosis: Lung Nodule   Location: [facility] PROCEDURE RM 02 / [facility] ORS FOR ANESTHESIA GROUP   Surgeons: Salena Saner, MD     NOTE: Available PAT nursing documentation and vital signs have been reviewed. Clinical nursing staff has updated patient's PMH/PSHx, current medication list, and drug allergies/intolerances to ensure comprehensive history available to assist in medical decision making as it pertains to the aforementioned surgical procedure and anticipated anesthetic course. Extensive review of available clinical information personally performed. [place] [facility] and PSH[facility] updated with any diagnoses/procedures that  may have been inadvertently omitted during his intake with the pre-admission testing department's nursing staff.  Clinical Discussion:  Antonio [name] is a 77 y.o. male who is submitted for pre-surgical anesthesia review and clearance prior to him undergoing the above procedure. Patient is a Former Smoker (114 pack years; quit 05/2021). Pertinent PMH includes: CAD, atrial fibrillation/flutter, diastolic dysfunction, Mobitz 1 AV block, chronic ischemic RIGHT MCA infarct, PVD, BILATERAL carotid artery stenosis, aortic atherosclerosis, HTN, HLD, T2DM, DOE, emphysema, NSCLC, GERD (no daily Tx), esophageal stricture, hiatal hernia, gastric AVM, anemia, BPH with BOO, OA.  Patient is followed by cardiology Azucena Cecil, MD). He was last seen in the cardiology clinic on 05/102024; notes reviewed. At the time of his clinic visit, patient doing well overall from a cardiovascular  perspective. Patient with chronic exertional dyspnea related to his underlying emphysema, prolonged smoking history, and pulmonary malignancy. He also was experiencing positional dizziness. Patient denied any chest pain, PND, orthopnea, palpitations, significant peripheral edema, weakness, fatigue, or presyncope/syncope. Patient with a past medical history significant for cardiovascular diagnoses. Documented physical exam was grossly benign, providing no evidence of acute exacerbation and/or decompensation of the patient's known cardiovascular conditions.  Patient underwent myocardial perfusion imaging study performed on 05/20/2021.  Resting images were able to be obtained, however procedure had to be aborted when patient developed symptomatic bradycardia with a rate in 30s.  Resting images demonstrated decreased perfusion in the inferior wall, however cardiology unable to exclude diaphragmatic attenuation artifact.  Further evaluation was recommended.  Coronary CTA was performed on 05/22/2021 revealing a calcium score of 1524, which places the patient in the 84th percentile for age and sex matched control.  Concern for severe stenosis in the proximal OM1, left circumflex, RPLA and proximal D2 noted.  FFR analysis was performed (normal range >0.80):  Left Main: FFR = 0.97 LAD: Proximal FFR = 0.88, Mid FFR = 0.81, distal FFR = 0.77 LCX: Proximal FFR = 0.96, distal FFR = 0.67, OM1 prox = 0.82, distal = 0.66 RCA: Proximal FFR = 0.96, mid FFR =0.89, Distal FFR = 0.86, RPLA = 0.77  Patient underwent diagnostic left heart catheterization on 05/23/2021.  LVEDP normal at 7 mmHg.  There was mild to moderate diffuse coronary artery disease noted; 20% distal RCA and 50% OM1.  Given the nonobstructive nature of his coronary artery disease, the decision was made to defer intervention opting for medical management.  Most recent TTE was performed on 04/06/2022 revealing a normal left ventricular systolic function with  an EF of 60 to 65%.  There  were no regional wall motion abnormalities.  There was moderate LVH. Left ventricular diastolic Doppler parameters consistent with abnormal relaxation (G1DD).  Right ventricular size and function normal.  RVSP normal at 17.7 mmHg.  There was mild mitral valve regurgitation.  Aortic valve sclerosis present. All transvalvular gradients were noted to be normal providing no evidence suggestive of valvular stenosis.  Patient underwent CT imaging of the head and neck on 04/05/2022.  Study revealed chronic cortical/subcortical RIGHT MCA territory infarct within the RIGHT parietal lobe and RIGHT temporoparietal junction.  Angiography study of the neck revealed severe (~80%) stenosis of the LICA and (~70%) contralateral stenosis of the RICA.  Moderate atherosclerotic narrowing of the intracranial ICAs noted BILATERALLY.  Patient underwent PTA and stent placement on 04/07/2022, at which time a 9 x 7 x 30 mm exact stent was placed to the RICA.  Follow-up carotid Doppler study performed on 04/20/2022 revealing a 1-39% RICA and 40-59% stenosis.  Patient also has a history of extensive PVD with claudication pain in his lower extremities.  Patient has had stents placed in his lower extremities in the past.  Given his history of significant PVD and recent stenting of his carotid artery, patient remains on daily DAPT therapy (ASA + clopidogrel).  Patient is reportedly compliant with therapy with no evidence or reports of GI bleeding. Patient with an atrial fibrillation diagnosis; CHA2DS2-VASc Score = 7 (age x 2, HTN, CVA x 2, vascular disease history, T2DM). Rate and rhythm maintained without the use of pharmacological intervention.  As previously mentioned, patient on antithrombotic therapy, however he is not on a DOAC.  Blood pressure elevated at 182/94 mmHg on currently prescribed  vasodilator (hydralazine) and ACEi (lisinopril) therapies.  Patient is on atorvastatin for his HLD diagnosis and  further ASCVD prevention.  In the setting of known cardiovascular disease, it is important note the patient is on a PDE5i (sildenafil) for and erectile dysfunction diagnosis. T2DM reasonably controlled on currently prescribed regimen; last HgbA1c was 7.4% when checked on 05/12/2021.  Of note, since patient was last seen by cardiology, his hemoglobin A1c level has been rechecked with worsening to 8.8% noted on 04/05/2022.  Patient does not have an OSAH diagnosis. Patient being followed by electrophysiology Graciela Husbands, MD) for Mobitz I heart block. Notes indicate long standing history of sinus bradycardia and no clear indication for permanent pacemaker implantation. Functional capacity limited by age, pulmonary malignancy, claudication pain, and other various multiple medical comorbidities.Patient questionably able to complete 4 METS of physical activity without experiencing, at least to some degree, angina/anginal equivalent symptoms. Given his elevated blood pressures in the clinic, patient was advised to take Lisinopril 20 mg daily for the next two days and monitor blood pressure. If  SBP is still > 140, patient instructed to increase the dose to 40 mg daily. No other changes were made to his medication regimen. Patient to follow up with cardiology/electrophysiology in 6-8 weeks or sooner if needed.   Antonio [name] with a known history of stage I non-small cell lung cancer.  Patient underwent SBRT to the LEFT upper lobe.  Repeat CT imaging of the chest performed on 05/11/2022 revealing a focal bandlike opacity along the lingula and a possible focal hilar lesion.  Additionally, there was paratracheal and hilar lymphadenopathy noted.  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  Patient has met with pulmonary medicine and is scheduled to undergo a  FLEXIBLE BRONCHOSCOPY (Left); ENDOBRONCHIAL ULTRASOUND (Left) on  06/19/2022 with Dr.  Sarina Ser, MD.  Given patient's past medical history significant for cardiovascular diagnoses, presurgical cardiac clearance was sought by the PAT team. Per cardiology, "based ACC/AHA guidelines, the patient's past medical history, and the amount of time since his last clinic visit, this patient would be at an overall ACCEPTABLE risk for the planned procedure without further cardiovascular testing or intervention at this time".   Again, patient is on daily DAPT therapy.  He has been instructed on recommendations from his cardiologist and pulmonary medicine provider for holding his DAPT medications for 5 days prior to his procedure with plans to restart as soon as postoperative bleeding risk to be minimized by his primary team surgeon.  The patient is aware that his last dose of both his aspirin and compatible with PE on 06/13/2022.  Patient denies previous perioperative complications with anesthesia in the past. In review of the available records, it is noted that patient underwent a general anesthetic course here at [facility] (ASA III) in 05/2021 without documented complications.      06/16/2022    1:50 PM 06/12/2022    1:35 PM 06/08/2022   10:16 AM  Vitals with BMI  Height 5\' 7"  5\' 7"  5\' 7"   Weight 181 lbs 13 oz 183 lbs 6 oz 185 lbs  BMI 28.47 28.72 28.97  Systolic 182 148 161  Diastolic 94 84 76  Pulse 64 66     Providers/Specialists:   NOTE: Primary physician provider listed below. Patient may have been seen by APP or partner within same practice.   PROVIDER ROLE / SPECIALTY LAST Barnie Del, MD Pulmonary Medicine (Surgeon) 06/12/2022  Doreene Nest, NP Primary Care Provider 05/22/2022  Debbe Odea, MD Cardiology 06/09/2021  Sherryl Manges, MD Electrophysiology 04/13/2022  Levora Dredge, MD Vascular Surgery 04/20/2022  Carmina Miller, MD Radiation Oncology 06/08/2022   Allergies:  Patient has no known  allergies.  Current Home Medications:   No current facility-administered medications for this encounter.    acetaminophen (TYLENOL) 500 MG tablet   aspirin 81 MG EC tablet   atorvastatin (LIPITOR) 20 MG tablet   clopidogrel (PLAVIX) 75 MG tablet   ferrous gluconate (FERGON) 324 MG tablet   FIBER PO   finasteride (PROSCAR) 5 MG tablet   fluticasone (FLONASE) 50 MCG/ACT nasal spray   Fluticasone-Umeclidin-Vilant (TRELEGY ELLIPTA) 100-62.5-25 MCG/ACT AEPB   Fluticasone-Umeclidin-Vilant (TRELEGY ELLIPTA) 100-62.5-25 MCG/ACT AEPB   glipiZIDE (GLUCOTROL) 10 MG tablet   GLUCOSAMINE HCL PO   hydrALAZINE (APRESOLINE) 50 MG tablet   Insulin Glargine (BASAGLAR KWIKPEN) 100 UNIT/ML   lisinopril (ZESTRIL) 20 MG tablet   metFORMIN (GLUCOPHAGE) 500 MG tablet   pantoprazole (PROTONIX) 40 MG tablet   sildenafil (VIAGRA) 100 MG tablet   tamsulosin (FLOMAX) 0.4 MG CAPS capsule   vitamin B-12 (CYANOCOBALAMIN) 1000 MCG tablet   History:   Past Medical History:  Diagnosis Date   Adenoma of left adrenal gland    Anemia    Aortic atherosclerosis (HCC)    Arthritis    Atrial fibrillation and flutter (HCC)    a.) CHA2DS2VASc = 7 (age x 2, HTN, CVA x 2, vascular disease history, T2DM);  b.) rate/rhythm maintained without pharmacological intervention; chronic antithrombotic therapy using ASA + clopidogrel   Bilateral carotid artery stenosis    a.) s/p PTA 04/07/2022 --> 90% RICA --> 9x7x30 Exact stent; b.) doppler 03/14/20214: 1-39% RICA, 40-59% LICA   BPH with urinary obstruction    stable on  flomax ([name]   Childhood asthma    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   a.) CT head and MRI brain 04/05/2022: chronic cortical/subcortical RIGHT MCA territory infarct within the RIGHT parietal lobe and RIGHT temporoparietal junction   Coronary atherosclerosis of unspecified type of vessel, native or graft    a.) cCTA 05/22/2021: Ca score 1524 (84th percentile for age/sex match control) -->  FFRct: dLAD = 0.77, dLCx = 0.67, RPLA = 0.77, dOM1 = 0.66; b.) LHC 05/23/2021: 20% dRCA, 50% OM1 - med mgmt   Diastolic dysfunction    a.) TTE 04/06/2022: EF 60-65%, mod LVH, mild MR, AoV sclerosis, G1DD   DOE (dyspnea on exertion)    Emphysema of lung (HCC)    Erectile dysfunction    a.) on PDE5i (sildenafil)   Esophageal stricture    Fall    Gastric AVM    Gastritis    GERD (gastroesophageal reflux disease)    Hiatal hernia    Hx of colonic polyps    Hyperlipidemia    Hypertension    Internal hemorrhoids without mention of complication    Ischemic leg    Long term current use of antithrombotics/antiplatelets    a.) on DAPT (ASA + clopidogrel)   Lumbago    Mobitz (type) I (Wenckebach's) atrioventricular block    Non-small cell lung cancer (HCC)    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   a.) PCR (+) 05/29/2018, 04/07/2022   Pneumonia    PSA elevation    now averaging 2's   PVD (peripheral vascular disease) with claudication (HCC)    Sigmoid diverticulosis    Tobacco abuse    Type 2 diabetes mellitus treated with insulin [facility])    Past Surgical History:  Procedure Laterality Date   2D Echo  07/2001   BACK SURGERY     CAROTID PTA/STENT INTERVENTION Right 04/07/2022   Procedure: CAROTID PTA/STENT INTERVENTION;  Surgeon: Renford Dills, MD;  Location: [facility] INVASIVE CV LAB;  Service: Cardiovascular;  Laterality: Right;   COLONOSCOPY     ESOPHAGOGASTRODUODENOSCOPY  02/13/2007   gastritis and duodenitis without bleed   ESOPHAGOGASTRODUODENOSCOPY N/A 06/26/2014   Procedure: ESOPHAGOGASTRODUODENOSCOPY (EGD);  Surgeon: Rachael Fee, MD;  [facility] ENDOSCOPY;  Service: Endoscopy;  Laterality: N/A;   ESOPHAGOGASTRODUODENOSCOPY (EGD) WITH PROPOFOL N/A 04/08/2020   Procedure: ESOPHAGOGASTRODUODENOSCOPY (EGD) WITH PROPOFOL;  Surgeon: Meryl Dare, MD;  Location: [facility] ENDOSCOPY;  Service: Endoscopy;  Laterality: N/A;   HOT HEMOSTASIS N/A 04/08/2020   Procedure: HOT HEMOSTASIS  (ARGON PLASMA COAGULATION/BICAP);  Surgeon: Meryl Dare, MD;  Location: [facility] ENDOSCOPY;  Service: Endoscopy;  Laterality: N/A;   KNEE ARTHROSCOPY Right 11/1998   LEFT HEART CATH AND CORONARY ANGIOGRAPHY N/A 05/23/2021   Procedure: LEFT HEART CATH AND CORONARY ANGIOGRAPHY;  Surgeon: Orbie Pyo, MD;  Location: [facility] INVASIVE CV LAB;  Service: Cardiovascular;  Laterality: N/A;   LOWER EXTREMITY ANGIOGRAPHY Right 12/27/2017   Procedure: LOWER EXTREMITY ANGIOGRAPHY;  Surgeon: Annice Needy, MD;  Location: [facility] INVASIVE CV LAB;  Service: Cardiovascular;  Laterality: Right;   LOWER EXTREMITY ANGIOGRAPHY Left 01/10/2018   Procedure: LOWER EXTREMITY ANGIOGRAPHY;  Surgeon: Annice Needy, MD;  Location: [facility] INVASIVE CV LAB;  Service: Cardiovascular;  Laterality: Left;   LOWER EXTREMITY ANGIOGRAPHY Left 05/29/2018   Procedure: LOWER EXTREMITY ANGIOGRAPHY;  Surgeon: Annice Needy, MD;  Location: [facility] INVASIVE CV LAB;  Service: Cardiovascular;  Laterality: Left;   LOWER EXTREMITY ANGIOGRAPHY Left 05/30/2018   Procedure: Lower Extremity Angiography;  Surgeon: Wyn Quaker,  Marlow Baars, MD;  Location: [facility] INVASIVE CV LAB;  Service: Cardiovascular;  Laterality: Left;   LP  07/2001   Microwave thermotherapy prostate  08/28/2007   [place]   [place] cardiolite  04/1999   EF 68%   POLYPECTOMY  12/1993   Stress myoview  08/2004   EF 67%   UPPER GASTROINTESTINAL ENDOSCOPY     Family History  Problem Relation Age of Onset   Rheum arthritis Mother    Diabetes Father    Alcohol abuse Paternal Uncle    Alcohol abuse Paternal Uncle    Alcohol abuse Paternal Uncle    Heart disease Neg Hx    Stroke Neg Hx    Cancer Neg Hx    Drug abuse Neg Hx    Depression Neg Hx    Colon cancer Neg Hx    Rectal cancer Neg Hx    Stomach cancer Neg Hx    Kidney cancer Neg Hx    Bladder Cancer Neg Hx    Prostate cancer Neg Hx    Social History   Tobacco Use   Smoking status: Former    Packs/day: 2.00    Years: 57.00     Additional pack years: 0.00    Total pack years: 114.00    Types: Cigarettes    Start date: 02/07/1964    Quit date: 05/11/2021    Years since quitting: 1.0   Smokeless tobacco: Former    Types: Associate Professor Use: Never used  Substance Use Topics   Alcohol use: Yes    Comment: rare   Drug use: No    Pertinent Clinical Results:  LABS:   Lab Results  Component Value Date   WBC 8.7 04/14/2022   HGB 12.3 (L) 04/14/2022   HCT 37.9 (L) 04/14/2022   MCV 90.0 04/14/2022   PLT 281.0 04/14/2022   Lab Results  Component Value Date   NA 140 04/14/2022   K 4.4 04/14/2022   CO2 28 04/14/2022   GLUCOSE 181 (H) 04/14/2022   BUN 15 04/14/2022   CREATININE 0.89 04/14/2022   CALCIUM 9.0 04/14/2022   GFRNONAA >60 04/08/2022    ECG: Date: 04/13/2022 Time ECG obtained: 1000 AM Rate: 52 bpm Rhythm:  Atrial fibrillation with slow ventricular response Axis (leads I and aVF): Left axis deviation Intervals: QRS 84 ms. QTc 408 ms. ST segment and T wave changes: No evidence of acute ST segment elevation or depression.  Evidence of an age undetermined inferior infarct present Comparison: Similar to previous tracing obtained on 04/05/2022   IMAGING / PROCEDURES: VAS [place] ABI WITH/WO TBI performed on 04/20/2022  Resting right ankle-brachial index indicates moderate right lower extremity arterial disease. The right toe-brachial index is abnormal.  Resting left ankle-brachial index indicates mild left lower extremity arterial disease. The left toe-brachial index is abnormal.   VAS US CAROTID performed on 04/20/2022 Velocities in the right ICA are consistent with a 1-39% stenosis. Non-hemodynamically significant plaque <50% noted in the CCA.  Velocities in the left ICA are consistent with a 40-59% stenosis.  Bilateral vertebral arteries demonstrate antegrade flow.  Normal flow hemodynamics were seen in bilateral subclavian arteries.   TRANSTHORACIC ECHOCARDIOGRAM performed on  04/06/2022 Left ventricular ejection fraction, by estimation, is 60 to 65%. The left ventricle has normal function. The left ventricle has no regional  wall motion abnormalities. There is moderate left ventricular hypertrophy.  Left ventricular diastolic  parameters are consistent with Grade  diastolic dysfunction (impaired relaxation).  Right ventricular systolic function is normal. The right ventricular size is normal. There is normal pulmonary artery systolic pressure. The estimated right ventricular systolic pressure is 17.7 mmHg.  The mitral valve is normal in structure. Mild mitral valve regurgitation. No evidence of mitral stenosis.  The aortic valve is tricuspid. Aortic valve regurgitation is not visualized. Aortic valve sclerosis is present, with no evidence of aortic valve stenosis.  The inferior vena cava is normal in size with greater than 50% respiratory variability, suggesting right atrial pressure of 3 mmHg.   LEFT HEART CATHETERIZATION AND CORONARY ANGIOGRAPHY performed on 05/23/2021 LVEDP = 7 mmHg 20% distal RCA 50% OM1 Recommendations: medical therapy   CT CORONARY MORPH W/CTA COR W/SCORE W/CA W/CM &/OR WO/CM W/ FRACTIONAL FLOW RESERVE FLUID ANALYSIS performed on 05/22/2021 Severe CAD in the proximal OM1, L circumflex, RPLA, and proximal second diagonal, CADRADS = 4.   Coronary calcium score is 1524, which places the patient in the 84th percentile for age and sex matched control Normal coronary origins with right dominance. FFR CT analysis (normal range >0.80) Left Main: FFR = 0.97 LAD: Proximal FFR = 0.88, Mid FFR = 0.81, distal FFR = 0.77 LCX: Proximal FFR = 0.96, distal FFR = 0.67, OM1 prox = 0.82, distal = 0.66 RCA: Proximal FFR = 0.96, mid FFR =0.89, Distal FFR = 0.86, RPLA = 0.77 Recommendations: Guideline-directed medical therapy and aggressive risk factor modification for secondary prevention of coronary artery disease. Consider cardiac catheterization for left  circumflex system disease if felt to be a contributor to symptoms.  MYOCARDIAL PERFUSION IMAGING STUDY (LEXISCAN) performed on 05/20/2021 Myoview attempted Resting images obtained Stress part of test canceled given symptomatic bradycardia rate in the 30s Resting images with decreased perfusion in the inferior wall, unable to exclude diaphragmatic attenuation artifact   Impression and Plan:  Antonio [name] has been referred for pre-anesthesia review and clearance prior to him undergoing the planned anesthetic and procedural courses. Available labs, pertinent testing, and imaging results were personally reviewed by me in preparation for upcoming operative/procedural course. [facility] medical record has been updated following extensive record review and patient interview with PAT staff.   This patient has been appropriately cleared by cardiology with an overall ACCEPTABLE risk of significant perioperative cardiovascular complications. Based on clinical review performed today (06/16/22), barring any significant acute changes in the patient's overall condition, it is anticipated that he will be able to proceed with the planned surgical intervention. Any acute changes in clinical condition may necessitate his procedure being postponed and/or cancelled. Patient will meet with anesthesia team (MD and/or CRNA) on the day of his procedure for preoperative evaluation/assessment. Questions regarding anesthetic course will be fielded at that time.   Pre-surgical instructions were reviewed with the patient during his PAT appointment, and questions were fielded to satisfaction by PAT clinical staff. He has been instructed on which medications that he will need to hold prior to surgery, as well as the ones that have been deemed safe/appropriate to take on the day of his procedure. As part of the general education provided by PAT, patient made aware both verbally and in writing, that he would need to abstain from the  use of any illegal substances during his perioperative course.  He was advised that failure to follow the provided instructions could necessitate case cancellation or result in serious perioperative complications up to and including death. Patient encouraged to contact PAT and/or his surgeon's office to discuss any questions or concerns that may arise prior  to surgery; verbalized understanding.   Quentin Mulling, MSN, APRN, FNP-C, CEN [facility]  Peri-operative Services Nurse Practitioner Phone: [phone] Fax: [phone] 06/16/22 2:52 PM  NOTE: This note has been prepared using Dragon dictation software. Despite my best ability to proofread, there is always the potential that unintentional transcriptional errors may still occur from this process.

## 2022-06-16 NOTE — Progress Notes (Signed)
Cardiology Office Note:    Date:  06/16/2022   ID:  MACAI MULROY, DOB 1945-07-25, MRN [id number]  PCP:  Doreene Nest, NP   [facility] Providers Cardiologist:  Debbe Odea, MD Electrophysiologist:  Sherryl Manges, MD     Referring MD: Doreene Nest, NP   Chief Complaint  Patient presents with   Pre-op Exam    Scheduled for bronchoscopy on 06/19/2022 bronchoscopy due to abnormality on recent PET scan.  BP in office 182/94, pt states he takes home bp three times a day with noon reading 146/64.  Only takes Lisinopril 20 mg if systolic above 150 and took one this morning.      History of Present Illness:    Antonio [name] is a 77 y.o. male with a hx of nonobstructive CAD (LHC 05/2021- 20% RCA, 50% OM1), hypertension, hyperlipidemia, diabetes, COPD, former smoker x50+ years, PAD s/p bilateral SFA/popliteal stents, right carotid artery stent, non-small cell lung cancer s/p radiation therapy who presents for preop evaluation.  Being followed by pulmonary medicine due to mediastinal adenopathy.  Bronchoscopy being planned in 3 days.  Previously had radiation therapy for non-small cell lung cancer. Denies any new complaints or concerns at this time.  He takes his blood pressure occasion only when systolic is over 782.  Supposed to take lisinopril 20 mg daily.  Prior notes Echo 03/2022 EF 60 to 65% LHC 05/2021- 20% RCA, 50% OM1  Past Medical History:  Diagnosis Date   Adenoma of left adrenal gland    Anemia    Aortic atherosclerosis (HCC)    Arthritis    Atrial fibrillation and flutter (HCC)    a.) CHA2DS2VASc = 7 (age x 2, HTN, CVA x 2, vascular disease history, T2DM);  b.) rate/rhythm maintained without pharmacological intervention; chronic antithrombotic therapy using ASA + clopidogrel   Bilateral carotid artery stenosis    a.) s/p PTA 04/07/2022 --> 90% RICA --> 9x7x30 Exact stent; b.) doppler 03/14/20214: 1-39% RICA, 40-59% LICA   BPH with urinary obstruction     stable on flomax ([name]   Childhood asthma    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   a.) CT head and MRI brain 04/05/2022: chronic cortical/subcortical RIGHT MCA territory infarct within the RIGHT parietal lobe and RIGHT temporoparietal junction   Coronary atherosclerosis of unspecified type of vessel, native or graft    a.) cCTA 05/22/2021: Ca score 1524 (84th percentile for age/sex match control) --> FFRct: dLAD = 0.77, dLCx = 0.67, RPLA = 0.77, dOM1 = 0.66; b.) LHC 05/23/2021: 20% dRCA, 50% OM1 - med mgmt   Diastolic dysfunction    a.) TTE 04/06/2022: EF 60-65%, mod LVH, mild MR, AoV sclerosis, G1DD   DOE (dyspnea on exertion)    Emphysema of lung (HCC)    Erectile dysfunction    a.) on PDE5i (sildenafil)   Esophageal stricture    Fall    Gastric AVM    Gastritis    GERD (gastroesophageal reflux disease)    Hiatal hernia    Hx of colonic polyps    Hyperlipidemia    Hypertension    Internal hemorrhoids without mention of complication    Ischemic leg    Long term current use of antithrombotics/antiplatelets    a.) on DAPT (ASA + clopidogrel)   Lumbago    Mobitz (type) I (Wenckebach's) atrioventricular block    Non-small cell lung cancer (HCC)    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   a.) PCR (+) 05/29/2018, 04/07/2022  Pneumonia    PSA elevation    now averaging 2's   PVD (peripheral vascular disease) with claudication (HCC)    Sigmoid diverticulosis    Tobacco abuse    Type 2 diabetes mellitus treated with insulin [facility])     Past Surgical History:  Procedure Laterality Date   2D Echo  07/2001   BACK SURGERY     CAROTID PTA/STENT INTERVENTION Right 04/07/2022   Procedure: CAROTID PTA/STENT INTERVENTION;  Surgeon: Renford Dills, MD;  Location: [facility] INVASIVE CV LAB;  Service: Cardiovascular;  Laterality: Right;   COLONOSCOPY     ESOPHAGOGASTRODUODENOSCOPY  02/13/2007   gastritis and duodenitis without bleed   ESOPHAGOGASTRODUODENOSCOPY N/A 06/26/2014    Procedure: ESOPHAGOGASTRODUODENOSCOPY (EGD);  Surgeon: Rachael Fee, MD;  Location: [facility] ENDOSCOPY;  Service: Endoscopy;  Laterality: N/A;   ESOPHAGOGASTRODUODENOSCOPY (EGD) WITH PROPOFOL N/A 04/08/2020   Procedure: ESOPHAGOGASTRODUODENOSCOPY (EGD) WITH PROPOFOL;  Surgeon: Meryl Dare, MD;  Location: [facility] ENDOSCOPY;  Service: Endoscopy;  Laterality: N/A;   HOT HEMOSTASIS N/A 04/08/2020   Procedure: HOT HEMOSTASIS (ARGON PLASMA COAGULATION/BICAP);  Surgeon: Meryl Dare, MD;  Location: [facility] ENDOSCOPY;  Service: Endoscopy;  Laterality: N/A;   KNEE ARTHROSCOPY Right 11/1998   LEFT HEART CATH AND CORONARY ANGIOGRAPHY N/A 05/23/2021   Procedure: LEFT HEART CATH AND CORONARY ANGIOGRAPHY;  Surgeon: Orbie Pyo, MD;  Location: [facility] INVASIVE CV LAB;  Service: Cardiovascular;  Laterality: N/A;   LOWER EXTREMITY ANGIOGRAPHY Right 12/27/2017   Procedure: LOWER EXTREMITY ANGIOGRAPHY;  Surgeon: Annice Needy, MD;  Location: [facility] INVASIVE CV LAB;  Service: Cardiovascular;  Laterality: Right;   LOWER EXTREMITY ANGIOGRAPHY Left 01/10/2018   Procedure: LOWER EXTREMITY ANGIOGRAPHY;  Surgeon: Annice Needy, MD;  Location: [facility] INVASIVE CV LAB;  Service: Cardiovascular;  Laterality: Left;   LOWER EXTREMITY ANGIOGRAPHY Left 05/29/2018   Procedure: LOWER EXTREMITY ANGIOGRAPHY;  Surgeon: Annice Needy, MD;  Location: [facility] INVASIVE CV LAB;  Service: Cardiovascular;  Laterality: Left;   LOWER EXTREMITY ANGIOGRAPHY Left 05/30/2018   Procedure: Lower Extremity Angiography;  Surgeon: Annice Needy, MD;  Location: [facility] INVASIVE CV LAB;  Service: Cardiovascular;  Laterality: Left;   LP  07/2001   Microwave thermotherapy prostate  08/28/2007   [place]   Persantine cardiolite  04/1999   EF 68%   POLYPECTOMY  12/1993   Stress myoview  08/2004   EF 67%   UPPER GASTROINTESTINAL ENDOSCOPY      Current Medications: Current Meds  Medication Sig   acetaminophen (TYLENOL) 500 MG tablet Take 1,000 mg by mouth every 6 (six)  hours. Rapid release   atorvastatin (LIPITOR) 20 MG tablet TAKE 1 TABLET BY MOUTH IN THE EVENING FOR CHOLESTEROL (Patient taking differently: Take 20 mg by mouth in the morning.)   ferrous gluconate (FERGON) 324 MG tablet TAKE 1 TABLET BY MOUTH EVERY DAY WITH BREAKFAST (Patient taking differently: Take 324 mg by mouth daily with breakfast.)   FIBER PO Take 1 capsule by mouth in the morning.   finasteride (PROSCAR) 5 MG tablet Take 1 tablet (5 mg total) by mouth daily. (Patient taking differently: Take 5 mg by mouth every morning.)   fluticasone (FLONASE) 50 MCG/ACT nasal spray SPRAY 2 SPRAYS INTO EACH NOSTRIL EVERY DAY (Patient taking differently: Place 2 sprays into both nostrils as needed.)   Fluticasone-Umeclidin-Vilant (TRELEGY ELLIPTA) 100-62.5-25 MCG/ACT AEPB Inhale 1 puff into the lungs daily.   Fluticasone-Umeclidin-Vilant (TRELEGY ELLIPTA) 100-62.5-25 MCG/ACT AEPB Inhale 1 puff into the lungs daily. (Patient taking differently: Inhale 1  puff into the lungs every morning.)   glipiZIDE (GLUCOTROL) 10 MG tablet Take 10 mg by mouth daily before breakfast.   GLUCOSAMINE HCL PO Take 1 tablet by mouth daily.   hydrALAZINE (APRESOLINE) 50 MG tablet Take 1 tablet (50 mg total) by mouth 3 (three) times daily as needed (If systolic BP greater than 150 mmHg.). If systolic BP greater than 150 mmHg.   Insulin Glargine (BASAGLAR KWIKPEN) 100 UNIT/ML Inject 30 Units into the skin every morning.   lisinopril (ZESTRIL) 20 MG tablet Take 1 tablet (20 mg total) by mouth daily. (Patient taking differently: Take 20 mg by mouth daily as needed. Patient reports taking if systolic over 160)   metFORMIN (GLUCOPHAGE) 500 MG tablet TAKE 2 TABLETS BY MOUTH TWICE A DAY WITH MEAL FOR DIABETES (Patient taking differently: Take 1,000 mg by mouth 2 (two) times daily. TAKE 2 TABLETS BY MOUTH TWICE A DAY WITH MEAL FOR DIABETES)   pantoprazole (PROTONIX) 40 MG tablet TAKE 1 TABLET (40 MG TOTAL) BY MOUTH DAILY. FOR HEARTBURN  (Patient taking differently: Take 40 mg by mouth every evening. For heartburn)   sildenafil (VIAGRA) 100 MG tablet Take 1 tablet (100 mg total) by mouth daily as needed for erectile dysfunction.   tamsulosin (FLOMAX) 0.4 MG CAPS capsule Take 1 capsule (0.4 mg total) by mouth 2 (two) times daily.   vitamin B-12 (CYANOCOBALAMIN) 1000 MCG tablet Take 1,000 mcg by mouth in the morning.     Allergies:   Patient has no known allergies.   Social History   Socioeconomic History   Marital status: Widowed    Spouse name: Armando Reichert   Number of children: 3   Years of education: Not on file   Highest education level: Not on file  Occupational History   Occupation: Retired Music therapist  Tobacco Use   Smoking status: Former    Packs/day: 2.00    Years: 57.00    Additional pack years: 0.00    Total pack years: 114.00    Types: Cigarettes    Start date: 02/07/1964    Quit date: 05/11/2021    Years since quitting: 1.0   Smokeless tobacco: Former    Types: Associate Professor Use: Never used  Substance and Sexual Activity   Alcohol use: Yes    Comment: rare   Drug use: No   Sexual activity: Yes  Other Topics Concern   Not on file  Social History Narrative   Married 9/09 wife with moderate memory problems.   3 adult children, 2 grandchildren   Would desire CPR   Social Determinants of Health   Financial Resource Strain: Low Risk  (01/24/2022)   Overall Financial Resource Strain (CARDIA)    Difficulty of Paying Living Expenses: Not hard at all  Food Insecurity: No Food Insecurity (04/10/2022)   Hunger Vital Sign    Worried About Running Out of Food in the Last Year: Never true    Ran Out of Food in the Last Year: Never true  Transportation Needs: No Transportation Needs (04/10/2022)   PRAPARE - Administrator, Civil Service (Medical): No    Lack of Transportation (Non-Medical): No  Physical Activity: Insufficiently Active (01/24/2022)   Exercise Vital Sign    Days of Exercise  per Week: 3 days    Minutes of Exercise per Session: 30 min  Stress: No Stress Concern Present (01/24/2022)   [employer] of Occupational Health - Occupational Stress Questionnaire    Feeling of  Stress : Not at all  Social Connections: Moderately Isolated (01/24/2022)   Social Connection and Isolation Panel [NHANES]    Frequency of Communication with Friends and Family: More than three times a week    Frequency of Social Gatherings with Friends and Family: Once a week    Attends Religious Services: More than 4 times per year    Active Member of [employer] or Organizations: No    Attends Banker Meetings: Never    Marital Status: Widowed     Family History: The patient's family history includes Alcohol abuse in his paternal uncle, paternal uncle, and paternal uncle; Diabetes in his father; Rheum arthritis in his mother. There is no history of Heart disease, Stroke, Cancer, Drug abuse, Depression, Colon cancer, Rectal cancer, Stomach cancer, Kidney cancer, Bladder Cancer, or Prostate cancer.  ROS:   Please see the history of present illness.     All other systems reviewed and are negative.  EKGs/Labs/Other Studies Reviewed:    The following studies were reviewed today:   EKG:  EKG is  ordered today.  The ekg ordered today demonstrates Mobitz 1 AV block, heart rate 52  Recent Labs: 04/08/2022: Magnesium 1.8 04/14/2022: ALT 23; BUN 15; Creatinine, Ser 0.89; Hemoglobin 12.3; Platelets 281.0; Potassium 4.4; Sodium 140  Recent Lipid Panel    Component Value Date/Time   CHOL 138 04/06/2022 1006   TRIG 243 (H) 04/06/2022 1006   HDL 41 04/06/2022 1006   CHOLHDL 3.4 04/06/2022 1006   VLDL 49 (H) 04/06/2022 1006   LDLCALC 48 04/06/2022 1006   LDLDIRECT 120.3 04/24/2011 1514     Risk Assessment/Calculations:          Physical Exam:    VS:  BP (!) 182/94 (BP Location: Left Arm, Patient Position: Sitting, Cuff Size: Normal)   Pulse 64   Ht 5\' 7"  (1.702 m)   Wt 181 lb  12.8 oz (82.5 kg)   SpO2 97%   BMI 28.47 kg/m     Wt Readings from Last 3 Encounters:  06/16/22 181 lb 12.8 oz (82.5 kg)  06/12/22 183 lb 6.4 oz (83.2 kg)  06/08/22 185 lb (83.9 kg)     GEN:  Well nourished, well developed in no acute distress HEENT: Normal NECK: No JVD; No carotid bruits CARDIAC: RRR, no murmurs, rubs, gallops RESPIRATORY: Diminished breath sounds, no wheezing ABDOMEN: Soft, non-tender, non-distended MUSCULOSKELETAL:  No edema; No deformity  SKIN: Warm and dry NEUROLOGIC:  Alert and oriented x 3 PSYCHIATRIC:  Normal affect   ASSESSMENT:    1. Pre-operative cardiovascular examination   2. Coronary artery disease involving native heart without angina pectoris, unspecified vessel or lesion type   3. Mobitz (type) I (Wenckebach's) atrioventricular block   4. Primary hypertension    PLAN:    In order of problems listed above:  Preop evaluation, bronchoscopy being planned. echo 03/2022 EF 60 to 65%.  Nonobstructive CAD.  Denies any symptoms.  Okay to proceed with bronchoscopy from a cardiac perspective. CAD (LHC 05/2021- 20% RCA, 50% OM1), denies chest pain.  Continue aspirin, Plavix, Lipitor 20. Mobitz 1 AV block.  Heart rate 52. no indication for pacemaker.  Continue to avoid AV nodal agents. Hypertension, BP elevated.  Advised to take lisinopril 20 mg daily over the next 2 days.  Increase to 40 mg on day 3 if systolic stays over 140.  Follow-up in 6 to 8 weeks.      Medication Adjustments/Labs and Tests Ordered: Current medicines are reviewed  at length with the patient today.  Concerns regarding medicines are outlined above.  Orders Placed This Encounter  Procedures   EKG 12-Lead   No orders of the defined types were placed in this encounter.   Patient Instructions  Medication Instructions:   Take Lisinopril - one tablet (20mh) by mouth daily for the next two days & check BP.  - if BP is still above 140 Increase the Lisinopril to 40 MG - Two tablets  daily.   *If you need a refill on your cardiac medications before your next appointment, please call your pharmacy*   Lab Work:  None Ordered  If you have labs (blood work) drawn today and your tests are completely normal, you will receive your results only by: MyChart Message (if you have MyChart) OR A paper copy in the mail If you have any lab test that is abnormal or we need to change your treatment, we will call you to review the results.   Testing/Procedures:  None Ordered    Follow-Up: At [facility], you and your health needs are our priority.  As part of our continuing mission to provide you with exceptional heart care, we have created designated Provider Care Teams.  These Care Teams include your primary Cardiologist (physician) and Advanced Practice Providers (APPs -  Physician Assistants and Nurse Practitioners) who all work together to provide you with the care you need, when you need it.  We recommend signing up for the patient portal called "MyChart".  Sign up information is provided on this After Visit Summary.  MyChart is used to connect with patients for Virtual Visits (Telemedicine).  Patients are able to view lab/test results, encounter notes, upcoming appointments, etc.  Non-urgent messages can be sent to your provider as well.   To learn more about what you can do with MyChart, go to [email or web address].    Your next appointment:   6 week(s)  Provider:   You may see Debbe Odea, MD or one of the following Advanced Practice Providers on your designated Care Team:   Nicolasa Ducking, NP Eula Listen, PA-C Cadence Fransico Michael, PA-C Charlsie Quest, NP   Signed, Debbe Odea, MD  06/16/2022 3:17 PM    [facility]

## 2022-06-16 NOTE — Patient Instructions (Signed)
Medication Instructions:   Take Lisinopril - one tablet (20mh) by mouth daily for the next two days & check BP.  - if BP is still above 140 Increase the Lisinopril to 40 MG - Two tablets daily.   *If you need a refill on your cardiac medications before your next appointment, please call your pharmacy*   Lab Work:  None Ordered  If you have labs (blood work) drawn today and your tests are completely normal, you will receive your results only by: MyChart Message (if you have MyChart) OR A paper copy in the mail If you have any lab test that is abnormal or we need to change your treatment, we will call you to review the results.   Testing/Procedures:  None Ordered    Follow-Up: At [facility], you and your health needs are our priority.  As part of our continuing mission to provide you with exceptional heart care, we have created designated Provider Care Teams.  These Care Teams include your primary Cardiologist (physician) and Advanced Practice Providers (APPs -  Physician Assistants and Nurse Practitioners) who all work together to provide you with the care you need, when you need it.  We recommend signing up for the patient portal called "MyChart".  Sign up information is provided on this After Visit Summary.  MyChart is used to connect with patients for Virtual Visits (Telemedicine).  Patients are able to view lab/test results, encounter notes, upcoming appointments, etc.  Non-urgent messages can be sent to your provider as well.   To learn more about what you can do with MyChart, go to [email or web address].    Your next appointment:   6 week(s)  Provider:   You may see Debbe Odea, MD or one of the following Advanced Practice Providers on your designated Care Team:   Nicolasa Ducking, NP Eula Listen, PA-C Cadence Fransico Michael, PA-C Charlsie Quest, NP

## 2022-06-18 MED ORDER — CHLORHEXIDINE GLUCONATE 0.12 % MT SOLN
15.0000 mL | Freq: Once | OROMUCOSAL | Status: AC
  Administered 2022-06-19: 15 mL via OROMUCOSAL

## 2022-06-18 MED ORDER — SODIUM CHLORIDE 0.9 % IV SOLN: Freq: Once | INTRAVENOUS | Status: AC

## 2022-06-18 MED ORDER — ORAL CARE MOUTH RINSE: 15.0000 mL | Freq: Once | OROMUCOSAL | Status: AC

## 2022-06-18 MED ORDER — SODIUM CHLORIDE 0.9 % IV SOLN: INTRAVENOUS | Status: DC

## 2022-06-19 ENCOUNTER — Other Ambulatory Visit: Payer: Self-pay

## 2022-06-19 ENCOUNTER — Ambulatory Visit: Payer: Medicare Other | Admitting: Urgent Care

## 2022-06-19 ENCOUNTER — Ambulatory Visit: Payer: Medicare Other

## 2022-06-19 ENCOUNTER — Encounter: Payer: Self-pay | Admitting: Pulmonary Disease

## 2022-06-19 ENCOUNTER — Ambulatory Visit
Admission: RE | Admit: 2022-06-19 | Discharge: 2022-06-19 | Disposition: A | Discharge status: Home / Self Care | Payer: Medicare Other | Source: Ambulatory Visit | Attending: Pulmonary Disease | Admitting: Pulmonary Disease

## 2022-06-19 ENCOUNTER — Encounter
Admission: RE | Disposition: A | Discharge status: Home / Self Care | Payer: Self-pay | Source: Ambulatory Visit | Attending: Pulmonary Disease

## 2022-06-19 DIAGNOSIS — I10 Essential (primary) hypertension: Secondary | ICD-10-CM | POA: Diagnosis not present

## 2022-06-19 DIAGNOSIS — Z7902 Long term (current) use of antithrombotics/antiplatelets: Secondary | ICD-10-CM | POA: Insufficient documentation

## 2022-06-19 DIAGNOSIS — I2510 Atherosclerotic heart disease of native coronary artery without angina pectoris: Secondary | ICD-10-CM | POA: Diagnosis not present

## 2022-06-19 DIAGNOSIS — J449 Chronic obstructive pulmonary disease, unspecified: Secondary | ICD-10-CM | POA: Insufficient documentation

## 2022-06-19 DIAGNOSIS — Z87891 Personal history of nicotine dependence: Secondary | ICD-10-CM | POA: Insufficient documentation

## 2022-06-19 DIAGNOSIS — Z794 Long term (current) use of insulin: Secondary | ICD-10-CM | POA: Insufficient documentation

## 2022-06-19 DIAGNOSIS — R911 Solitary pulmonary nodule: Secondary | ICD-10-CM | POA: Diagnosis not present

## 2022-06-19 DIAGNOSIS — Z79899 Other long term (current) drug therapy: Secondary | ICD-10-CM | POA: Insufficient documentation

## 2022-06-19 DIAGNOSIS — I441 Atrioventricular block, second degree: Secondary | ICD-10-CM | POA: Diagnosis not present

## 2022-06-19 DIAGNOSIS — K219 Gastro-esophageal reflux disease without esophagitis: Secondary | ICD-10-CM | POA: Diagnosis not present

## 2022-06-19 DIAGNOSIS — Z7951 Long term (current) use of inhaled steroids: Secondary | ICD-10-CM | POA: Diagnosis not present

## 2022-06-19 DIAGNOSIS — E119 Type 2 diabetes mellitus without complications: Secondary | ICD-10-CM | POA: Diagnosis not present

## 2022-06-19 DIAGNOSIS — I4892 Unspecified atrial flutter: Secondary | ICD-10-CM | POA: Insufficient documentation

## 2022-06-19 DIAGNOSIS — E785 Hyperlipidemia, unspecified: Secondary | ICD-10-CM | POA: Insufficient documentation

## 2022-06-19 DIAGNOSIS — R590 Localized enlarged lymph nodes: Secondary | ICD-10-CM

## 2022-06-19 DIAGNOSIS — C771 Secondary and unspecified malignant neoplasm of intrathoracic lymph nodes: Secondary | ICD-10-CM | POA: Insufficient documentation

## 2022-06-19 DIAGNOSIS — Z7984 Long term (current) use of oral hypoglycemic drugs: Secondary | ICD-10-CM | POA: Insufficient documentation

## 2022-06-19 DIAGNOSIS — Z01818 Encounter for other preprocedural examination: Secondary | ICD-10-CM

## 2022-06-19 DIAGNOSIS — Z85118 Personal history of other malignant neoplasm of bronchus and lung: Secondary | ICD-10-CM | POA: Insufficient documentation

## 2022-06-19 DIAGNOSIS — I4891 Unspecified atrial fibrillation: Secondary | ICD-10-CM | POA: Diagnosis not present

## 2022-06-19 DIAGNOSIS — R079 Chest pain, unspecified: Secondary | ICD-10-CM | POA: Diagnosis not present

## 2022-06-19 DIAGNOSIS — C770 Secondary and unspecified malignant neoplasm of lymph nodes of head, face and neck: Secondary | ICD-10-CM | POA: Diagnosis not present

## 2022-06-19 HISTORY — DX: Other forms of dyspnea: R06.09

## 2022-06-19 HISTORY — PX: FLEXIBLE BRONCHOSCOPY: SHX5094

## 2022-06-19 HISTORY — DX: Diverticulosis of large intestine without perforation or abscess without bleeding: K57.30

## 2022-06-19 HISTORY — DX: Unspecified atrial fibrillation: I48.91

## 2022-06-19 HISTORY — DX: Type 2 diabetes mellitus without complications: Z79.4

## 2022-06-19 HISTORY — DX: Other ill-defined heart diseases: I51.89

## 2022-06-19 HISTORY — DX: Long term (current) use of antithrombotics/antiplatelets: Z79.02

## 2022-06-19 HISTORY — DX: Peripheral vascular disease, unspecified: I73.9

## 2022-06-19 HISTORY — DX: Male erectile dysfunction, unspecified: N52.9

## 2022-06-19 HISTORY — DX: Type 2 diabetes mellitus without complications: E11.9

## 2022-06-19 HISTORY — DX: Unspecified asthma, uncomplicated: J45.909

## 2022-06-19 HISTORY — DX: Benign neoplasm of left adrenal gland: D35.02

## 2022-06-19 HISTORY — PX: ENDOBRONCHIAL ULTRASOUND: SHX5096

## 2022-06-19 LAB — GLUCOSE, CAPILLARY
Glucose-Capillary: 237 mg/dL — ABNORMAL HIGH (ref 70–99)
Glucose-Capillary: 211 mg/dL — ABNORMAL HIGH (ref 70–99)
Glucose-Capillary: 198 mg/dL — ABNORMAL HIGH (ref 70–99)

## 2022-06-19 SURGERY — BRONCHOSCOPY, FLEXIBLE
Anesthesia: General | Laterality: Left

## 2022-06-19 MED ORDER — FENTANYL CITRATE (PF) 100 MCG/2ML IJ SOLN
INTRAMUSCULAR | Status: AC
  Filled 2022-06-19: qty 2

## 2022-06-19 MED ORDER — ONDANSETRON HCL 4 MG/2ML IJ SOLN
INTRAMUSCULAR | Status: DC | PRN
  Administered 2022-06-19: 4 mg via INTRAVENOUS

## 2022-06-19 MED ORDER — INSULIN ASPART 100 UNIT/ML IJ SOLN
INTRAMUSCULAR | Status: AC
  Filled 2022-06-19: qty 1

## 2022-06-19 MED ORDER — FENTANYL CITRATE (PF) 100 MCG/2ML IJ SOLN: 25.0000 ug | INTRAMUSCULAR | Status: DC | PRN

## 2022-06-19 MED ORDER — SUGAMMADEX SODIUM 200 MG/2ML IV SOLN
INTRAVENOUS | Status: DC | PRN
  Administered 2022-06-19: 200 mg via INTRAVENOUS

## 2022-06-19 MED ORDER — ONDANSETRON HCL 4 MG/2ML IJ SOLN
INTRAMUSCULAR | Status: AC
  Filled 2022-06-19: qty 2

## 2022-06-19 MED ORDER — GLYCOPYRROLATE 0.2 MG/ML IJ SOLN
INTRAMUSCULAR | Status: DC | PRN
  Administered 2022-06-19: .2 mg via INTRAVENOUS

## 2022-06-19 MED ORDER — EPHEDRINE SULFATE (PRESSORS) 50 MG/ML IJ SOLN
INTRAMUSCULAR | Status: DC | PRN
  Administered 2022-06-19 (×2): 5 mg via INTRAVENOUS

## 2022-06-19 MED ORDER — LIDOCAINE HCL (CARDIAC) PF 100 MG/5ML IV SOSY
PREFILLED_SYRINGE | INTRAVENOUS | Status: DC | PRN
  Administered 2022-06-19: 100 mg via INTRAVENOUS

## 2022-06-19 MED ORDER — ROCURONIUM BROMIDE 100 MG/10ML IV SOLN
INTRAVENOUS | Status: DC | PRN
  Administered 2022-06-19: 50 mg via INTRAVENOUS

## 2022-06-19 MED ORDER — FENTANYL CITRATE (PF) 100 MCG/2ML IJ SOLN
INTRAMUSCULAR | Status: DC | PRN
  Administered 2022-06-19: 50 ug via INTRAVENOUS

## 2022-06-19 MED ORDER — ROCURONIUM BROMIDE 10 MG/ML (PF) SYRINGE
PREFILLED_SYRINGE | INTRAVENOUS | Status: AC
  Filled 2022-06-19: qty 10

## 2022-06-19 MED ORDER — CHLORHEXIDINE GLUCONATE 0.12 % MT SOLN
OROMUCOSAL | Status: AC
  Filled 2022-06-19: qty 15

## 2022-06-19 MED ORDER — LIDOCAINE HCL (PF) 2 % IJ SOLN
INTRAMUSCULAR | Status: AC
  Filled 2022-06-19: qty 5

## 2022-06-19 MED ORDER — DEXAMETHASONE SODIUM PHOSPHATE 10 MG/ML IJ SOLN
INTRAMUSCULAR | Status: DC | PRN
  Administered 2022-06-19: 5 mg via INTRAVENOUS

## 2022-06-19 MED ORDER — DEXAMETHASONE SODIUM PHOSPHATE 10 MG/ML IJ SOLN
INTRAMUSCULAR | Status: AC
  Filled 2022-06-19: qty 1

## 2022-06-19 MED ORDER — INSULIN ASPART 100 UNIT/ML IJ SOLN
5.0000 [IU] | Freq: Once | INTRAMUSCULAR | Status: AC
  Administered 2022-06-19: 5 [IU] via SUBCUTANEOUS

## 2022-06-19 MED ORDER — PROPOFOL 10 MG/ML IV BOLUS
INTRAVENOUS | Status: AC
  Filled 2022-06-19: qty 20

## 2022-06-19 MED ORDER — OXYCODONE HCL 5 MG/5ML PO SOLN: 5.0000 mg | Freq: Once | ORAL | Status: DC | PRN

## 2022-06-19 MED ORDER — OXYCODONE HCL 5 MG PO TABS: 5.0000 mg | ORAL_TABLET | Freq: Once | ORAL | Status: DC | PRN

## 2022-06-19 MED ORDER — GLYCOPYRROLATE 0.2 MG/ML IJ SOLN
INTRAMUSCULAR | Status: AC
  Filled 2022-06-19: qty 1

## 2022-06-19 MED ORDER — PROPOFOL 10 MG/ML IV BOLUS
INTRAVENOUS | Status: DC | PRN
  Administered 2022-06-19: 50 mg via INTRAVENOUS
  Administered 2022-06-19: 100 mg via INTRAVENOUS

## 2022-06-19 NOTE — Anesthesia Postprocedure Evaluation (Signed)
Anesthesia Post Note  Patient: Antonio [name]  Procedure(s) Performed: FLEXIBLE BRONCHOSCOPY (Left) ENDOBRONCHIAL ULTRASOUND (Left)  Patient location during evaluation: PACU Anesthesia Type: General Level of consciousness: awake and alert Pain management: pain level controlled Vital Signs Assessment: post-procedure vital signs reviewed and stable Respiratory status: spontaneous breathing, nonlabored ventilation, respiratory function stable and patient connected to nasal cannula oxygen Cardiovascular status: blood pressure returned to baseline and stable Postop Assessment: no apparent nausea or vomiting Anesthetic complications: no   No notable events documented.   Last Vitals:  Vitals:   06/19/22 1150 06/19/22 1155  BP:    Pulse: 63 63  Resp: (!) 9 10  Temp:    SpO2: 92% 94%    Last Pain:  Vitals:   06/19/22 1135  TempSrc:   PainSc: 0-No pain                 Cleda Mccreedy Torsten Weniger

## 2022-06-19 NOTE — Discharge Instructions (Signed)

## 2022-06-19 NOTE — Anesthesia Procedure Notes (Addendum)
Procedure Name: Intubation Date/Time: 06/19/2022 10:08 AM  Performed by: Rosaria Ferries, MDPre-anesthesia Checklist: Patient identified, Emergency Drugs available, Suction available and Patient being monitored Patient Re-evaluated:Patient Re-evaluated prior to induction Oxygen Delivery Method: Circle system utilized Preoxygenation: Pre-oxygenation with 100% oxygen Induction Type: IV induction Ventilation: Mask ventilation without difficulty Laryngoscope Size: McGraph and 4 Grade View: Grade I Tube type: Oral Tube size: 8.5 mm Number of attempts: 1 Airway Equipment and Method: Stylet Placement Confirmation: ETT inserted through vocal cords under direct vision, positive ETCO2 and breath sounds checked- equal and bilateral Secured at: 21 cm Tube secured with: Tape Dental Injury: Teeth and Oropharynx as per pre-operative assessment  Comments: Inserted by Hassel Neth

## 2022-06-19 NOTE — Op Note (Signed)
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  Consent: (Place X beside choice/s below)  The benefits, risks and possible complications of the procedure were        explained to:  _X__ patient  ___ patient's family  ___ other:___________  who verbalized understanding and gave:  ___ verbal  ___ written  _X__ verbal and written  ___ telephone  ___ other:________ consent.      Unable to obtain consent; procedure performed on emergent basis.     Other:    Benefits, limitations and potential complications of the procedure were discussed with the patient/family.  Complications from bronchoscopy are rare and most often minor, but if they occur they may include breathing difficulty, vocal cord spasm, hoarseness, slight fever, vomiting, dizziness, bronchospasm, infection, low blood oxygen, bleeding from biopsy site, or an allergic reaction to medications.  It is uncommon for patients to experience other more serious complications for example: Collapsed lung requiring chest tube placement, respiratory failure, heart attack and/or cardiac arrhythmia.  Patient agrees to proceed.    Anesthesia type: General endotracheal  Surgeon: Gailen Shelter, MD Assistant/Scrub: Leonie Man, RRT Anesthesiologist/CRNA: Margorie Alvia, MD/Janice Zachery Dakins, CRNA, Eugene Gavia, RN [facility]). Cytotechnology: [facility], available   PROCEDURE DETAILS: Patient was taken to Procedure Room 2 (Bronchoscopy Suite) in the OR area.  Appropriate Timeout performed and correct patient, name, ID and laterality confirmed.  Patient was inducted under general anesthesia and intubated with an 0.5 ET tube without  difficulty.  Once the patient was under adequate general anesthesia a Portex adapter was placed in the ET tube flange.  Through the Portex adapter the Ambu video bronchoscope was then advanced.  The visible distal trachea was normal, carina appeared normal.  Examination of the right tracheobronchial tree showed that the right upper lobe , right middle lobe, lower lobe bronchi were free of endobronchial lesions or any other abnormality.  At this point bronchoscope was brought to the left mainstem and examination revealed no endobronchial lesions on the left tracheobronchial tree.  There was some fullness at the secondary carina on the left upper lobe.  There were significant inspissated secretions on the left lower lobe and these were lavaged till clear.  After completing this, the bronchoscope was switched to an endobronchial ultrasound scope (EBUS scope) and the mediastinum was examined.  There was significant adenopathy on station 4L (LEFT lower paratracheal), no significant adenopathy on the left hilum, and no significant adenopathy noted in the subcarinal area.  The examination on the right showed no right paratracheal adenopathy, no hilar adenopathy and no subcarinal adenopathy noted from the right vantage point.  We proceeded to sample station 4L.  ROSE revealed adequate sampling of lymphocytes and atypical cells.  A total of 12 passes were performed these were placed in preservative for further analysis.  During the sampling a total of 3 needles were utilized due to 2 of the needles pending after 2 passes.  The needles where 22-gauge Cook EBUS, 25-gauge Cook EBUS and 21-gauge Olympus VIZI shot EBUS needle.  Having completed this sampling, the EBUS scope was retrieved and again replaced for the Ambu video bronchoscope.  The airway was then examined thoroughly to ensure adequate hemostasis.  After examination and inspection and noting excellent hemostasis, the patient received 9 mL of 1% lidocaine via bronchial  lavage prior to retrieving the bronchoscope.  At this point the procedure was terminated and the patient was allowed to emerge from general anesthesia.  Patient was extubated in the procedure room and transported to the PACU in satisfactory condition.  The patient did not have any complaints of chest pain or shortness of breath postprocedure.  Postprocedure examination shows symmetrical lung sounds.  Postprocedure chest x-ray showed no pneumothorax.  Patient tolerated the procedure well.   SPECIMENS (Sites): (Place X beside choice below)  Specimens Description   No Specimens Obtained     Washings    Lavage    Biopsies   X TBNA X12, Station 4L (Left,paratracheal)   Brushings    Sputum    FINDINGS:   Lymph node at station 4L (arrow) size 2 x 1.5 cm:   Extension of lymph node on station 4L (arrow):   EBUS needle in lymph node during sampling:    ESTIMATED BLOOD LOSS: Less than 2 mL   COMPLICATIONS/RESOLUTION: none, post procedure chest x-ray showed no pneumothorax:     IMPRESSION:POST-PROCEDURE DX:  Paratracheal adenopathy, rule out recurrent cancer Status post technically successful EBUS/TBNA History of non-small cell cancer status post SBRT    RECOMMENDATION/PLAN:  Await pathology reports Patient has appropriate oncology/pulmonary follow-ups Patient instructed to resume Plavix/aspirin in a.m. 5/14     C. Danice Goltz, MD Advanced Bronchoscopy PCCM [facility]    *This note was dictated using voice recognition software/Dragon.  Despite best efforts to proofread, errors can occur which can change the meaning.  Any change was purely unintentional.

## 2022-06-19 NOTE — Transfer of Care (Signed)
Immediate Anesthesia Transfer of Care Note  Patient: Antonio [name]  Procedure(s) Performed: FLEXIBLE BRONCHOSCOPY (Left) ENDOBRONCHIAL ULTRASOUND (Left)  Patient Location: PACU  Anesthesia Type:General  Level of Consciousness: awake  Airway & Oxygen Therapy: Patient Spontanous Breathing and Patient connected to face mask oxygen  Post-op Assessment: Report given to RN and Post -op Vital signs reviewed and stable  Post vital signs: Reviewed and stable  Last Vitals:  Vitals Value Taken Time  BP 147/69 06/19/22 1115  Temp    Pulse 74 06/19/22 1117  Resp 17 06/19/22 1117  SpO2 99 % 06/19/22 1117  Vitals shown include unvalidated device data.  Last Pain:  Vitals:   06/19/22 0837  TempSrc: Tympanic  PainSc: 0-No pain         Complications: No notable events documented.

## 2022-06-19 NOTE — Anesthesia Preprocedure Evaluation (Signed)
Anesthesia Evaluation  Patient identified by MRN, date of birth, ID band Patient awake    Reviewed: Allergy & Precautions, NPO status , Patient's Chart, lab work & pertinent test results  History of Anesthesia Complications Negative for: history of anesthetic complications  Airway Mallampati: III  TM Distance: <3 FB Neck ROM: full    Dental  (+) Chipped, Poor Dentition, Missing   Pulmonary shortness of breath and with exertion, asthma , COPD, former smoker   Pulmonary exam normal        Cardiovascular hypertension, + CAD  Normal cardiovascular exam+ dysrhythmias      Neuro/Psych  Neuromuscular disease CVA  negative psych ROS   GI/Hepatic Neg liver ROS, hiatal hernia,GERD  Controlled,,  Endo/Other  diabetes, Type 2    Renal/GU      Musculoskeletal   Abdominal   Peds  Hematology negative hematology ROS (+)   Anesthesia Other Findings Past Medical History: No date: Adenoma of left adrenal gland No date: Anemia No date: Aortic atherosclerosis (HCC) No date: Arthritis No date: Atrial fibrillation and flutter (HCC)     Comment:  a.) CHA2DS2VASc = 7 (age x 2, HTN, CVA x 2, vascular               disease history, T2DM);  b.) rate/rhythm maintained               without pharmacological intervention; chronic               antithrombotic therapy using ASA + clopidogrel No date: Bilateral carotid artery stenosis     Comment:  a.) s/p PTA 04/07/2022 --> 90% RICA --> 9x7x30 Exact               stent; b.) doppler 03/14/20214: 1-39% RICA, 40-59% LICA No date: BPH with urinary obstruction     Comment:  stable on flomax ([name] No date: Childhood asthma 04/05/2022: Chronic ischemic right MCA stroke     Comment:  a.) CT head and MRI brain 04/05/2022: chronic               cortical/subcortical RIGHT MCA territory infarct within               the RIGHT parietal lobe and RIGHT temporoparietal               junction No  date: Coronary atherosclerosis of unspecified type of vessel,  native or graft     Comment:  a.) cCTA 05/22/2021: Ca score 1524 (84th percentile for               age/sex match control) --> FFRct: dLAD = 0.77, dLCx =               0.67, RPLA = 0.77, dOM1 = 0.66; b.) LHC 05/23/2021: 20%               dRCA, 50% OM1 - med mgmt No date: Diastolic dysfunction     Comment:  a.) TTE 04/06/2022: EF 60-65%, mod LVH, mild MR, AoV               sclerosis, G1DD No date: DOE (dyspnea on exertion) No date: Emphysema of lung (HCC) No date: Erectile dysfunction     Comment:  a.) on PDE5i (sildenafil) No date: Esophageal stricture No date: Fall 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 No date: Gastritis No date: GERD (gastroesophageal reflux disease) No date: Hiatal hernia No date: Hx of colonic polyps No date: Hyperlipidemia No date: Hypertension No date:  Internal hemorrhoids without mention of complication No date: Ischemic leg No date: Long term current use of antithrombotics/antiplatelets     Comment:  a.) on DAPT (ASA + clopidogrel) No date: Lumbago No date: Mobitz (type) I (Wenckebach's) atrioventricular block No date: Non-small cell lung cancer (HCC) 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     Comment:  a.) PCR (+) 05/29/2018, 04/07/2022 No date: Pneumonia No date: PSA elevation     Comment:  now averaging 2's No date: PVD (peripheral vascular disease) with claudication (HCC) No date: Sigmoid diverticulosis No date: Tobacco abuse No date: Type 2 diabetes mellitus treated with insulin (HCC)  Past Surgical History: 07/2001: 2D Echo No date: BACK SURGERY 04/07/2022: CAROTID PTA/STENT INTERVENTION; Right     Comment:  Procedure: CAROTID PTA/STENT INTERVENTION;  Surgeon:               Renford Dills, MD;  Location: [facility] INVASIVE CV LAB;               Service: Cardiovascular;  Laterality: Right; No date: COLONOSCOPY 02/13/2007: ESOPHAGOGASTRODUODENOSCOPY     Comment:  gastritis and duodenitis without  bleed 06/26/2014: ESOPHAGOGASTRODUODENOSCOPY; N/A     Comment:  Procedure: ESOPHAGOGASTRODUODENOSCOPY (EGD);  Surgeon:               Rachael Fee, MD;  Location: [facility] ENDOSCOPY;  Service:               Endoscopy;  Laterality: N/A; 04/08/2020: ESOPHAGOGASTRODUODENOSCOPY (EGD) WITH PROPOFOL; N/A     Comment:  Procedure: ESOPHAGOGASTRODUODENOSCOPY (EGD) WITH               PROPOFOL;  Surgeon: Meryl Dare, MD;  Location: [facility]               ENDOSCOPY;  Service: Endoscopy;  Laterality: N/A; 04/08/2020: HOT HEMOSTASIS; N/A     Comment:  Procedure: HOT HEMOSTASIS (ARGON PLASMA               COAGULATION/BICAP);  Surgeon: Meryl Dare, MD;                Location: [facility] ENDOSCOPY;  Service: Endoscopy;  Laterality:              N/A; 11/1998: KNEE ARTHROSCOPY; Right 05/23/2021: LEFT HEART CATH AND CORONARY ANGIOGRAPHY; N/A     Comment:  Procedure: LEFT HEART CATH AND CORONARY ANGIOGRAPHY;                Surgeon: Orbie Pyo, MD;  Location: [facility] INVASIVE CV               LAB;  Service: Cardiovascular;  Laterality: N/A; 12/27/2017: LOWER EXTREMITY ANGIOGRAPHY; Right     Comment:  Procedure: LOWER EXTREMITY ANGIOGRAPHY;  Surgeon: Annice Needy, MD;  Location: [facility] INVASIVE CV LAB;  Service:               Cardiovascular;  Laterality: Right; 01/10/2018: LOWER EXTREMITY ANGIOGRAPHY; Left     Comment:  Procedure: LOWER EXTREMITY ANGIOGRAPHY;  Surgeon: Annice Needy, MD;  Location: [facility] INVASIVE CV LAB;  Service:               Cardiovascular;  Laterality: Left; 05/29/2018: LOWER EXTREMITY ANGIOGRAPHY; Left     Comment:  Procedure: LOWER EXTREMITY ANGIOGRAPHY;  Surgeon: Wyn Quaker,  Marlow Baars, MD;  Location: [facility] INVASIVE CV LAB;  Service:               Cardiovascular;  Laterality: Left; 05/30/2018: LOWER EXTREMITY ANGIOGRAPHY; Left     Comment:  Procedure: Lower Extremity Angiography;  Surgeon: Annice Needy, MD;  Location: [facility] INVASIVE CV LAB;   Service:               Cardiovascular;  Laterality: Left; 07/2001: LP 08/28/2007: Microwave thermotherapy prostate     Comment:  Artis Flock 04/1999: Persantine cardiolite     Comment:  EF 68% 12/1993: POLYPECTOMY 08/2004: Stress myoview     Comment:  EF 67% No date: UPPER GASTROINTESTINAL ENDOSCOPY  BMI    Body Mass Index: 28.82 kg/m      Reproductive/Obstetrics negative OB ROS                             Anesthesia Physical Anesthesia Plan  ASA: 3  Anesthesia Plan: General ETT   Post-op Pain Management:    Induction: Intravenous  PONV Risk Score and Plan: Ondansetron, Dexamethasone, Midazolam and Treatment may vary due to age or medical condition  Airway Management Planned: Oral ETT  Additional Equipment:   Intra-op Plan:   Post-operative Plan: Extubation in OR and Possible Post-op intubation/ventilation  Informed Consent: I have reviewed the patients History and Physical, chart, labs and discussed the procedure including the risks, benefits and alternatives for the proposed anesthesia with the patient or authorized representative who has indicated his/her understanding and acceptance.     Dental Advisory Given  Plan Discussed with: Anesthesiologist, CRNA and Surgeon  Anesthesia Plan Comments: (Patient consented for risks of anesthesia including but not limited to:  - adverse reactions to medications - damage to eyes, teeth, lips or other oral mucosa - nerve damage due to positioning  - sore throat or hoarseness - Damage to heart, brain, nerves, lungs, other parts of body or loss of life  Patient voiced understanding.)       Anesthesia Quick Evaluation

## 2022-06-19 NOTE — Interval H&P Note (Signed)
Antonio [name] has presented today for surgery, with the diagnosis of MEDIASTINAL LYMPHADENOPATHY.  The various methods of treatment have been discussed with the patient and family. After consideration of risks, benefits and other options for treatment, the patient has consented to  Procedure(s): BRONCHOSCOPY WITH ENDOBRONCHIAL ULTRASOUND AND TRANSBRONCHIAL NEEDLE ASPIRATE-LEFT as a surgical intervention.  The patient's history has been reviewed, patient examined, no change in status, stable for surgery.  I have reviewed the patient's chart and labs.  Questions were answered to the patient's satisfaction.  Benefits, limitations and potential complications of the procedure were discussed with the patient/family.  Complications from bronchoscopy are rare and most often minor, but if they occur they may include breathing difficulty, vocal cord spasm, hoarseness, slight fever, vomiting, dizziness, bronchospasm, infection, low blood oxygen, bleeding from biopsy site, or an allergic reaction to medications.  It is uncommon for patients to experience other more serious complications for example: Collapsed lung requiring chest tube placement, respiratory failure, heart attack and/or cardiac arrhythmia.  Patient agrees to proceed.  Gailen Shelter, MD Advanced Bronchoscopy PCCM [facility]    *This note was dictated using voice recognition software/Dragon.  Despite best efforts to proofread, errors can occur which can change the meaning. Any transcriptional errors that result from this process are unintentional and may not be fully corrected at the time of dictation.

## 2022-06-20 ENCOUNTER — Encounter: Payer: Self-pay | Admitting: Pulmonary Disease

## 2022-06-21 ENCOUNTER — Telehealth: Payer: Self-pay

## 2022-06-21 NOTE — Progress Notes (Signed)
Care Management & Coordination Services Pharmacy Team  Reason for Encounter: Appointment Reminder  Contacted patient to confirm telephone appointment with Al Corpus, PharmD on 06/26/2022 at 11:30.  {US HC Outreach:[id number]}  Do you have any problems getting your medications? {yes/no:20286} If yes what types of problems are you experiencing? {Problems:27223}  What is your top health concern you would like to discuss at your upcoming visit?   Have you seen any other providers since your last visit with PCP? Yes  Star Rating Drugs:  Medication:  Last Fill: Day Supply Atorvastatin 20 mg 05/02/22 90 Glipizide 10 mg 04/26/22 90 Lisinopril 20 mg 04/08/22 90 Metformin 500 mg 04/26/22 90  Care Gaps: Annual wellness visit in last year? Yes 01/24/2022  If Diabetic: Last eye exam / retinopathy screening: Overdue Last diabetic foot exam: Up to date  Al Corpus, PharmD notified  Claudina Lick, [place] Clinical Pharmacy Assistant [phone]

## 2022-06-22 DIAGNOSIS — H11002 Unspecified pterygium of left eye: Secondary | ICD-10-CM | POA: Diagnosis not present

## 2022-06-22 DIAGNOSIS — E119 Type 2 diabetes mellitus without complications: Secondary | ICD-10-CM | POA: Diagnosis not present

## 2022-06-22 DIAGNOSIS — H2511 Age-related nuclear cataract, right eye: Secondary | ICD-10-CM | POA: Diagnosis not present

## 2022-06-22 DIAGNOSIS — E113393 Type 2 diabetes mellitus with moderate nonproliferative diabetic retinopathy without macular edema, bilateral: Secondary | ICD-10-CM | POA: Diagnosis not present

## 2022-06-22 LAB — HM DIABETES EYE EXAM

## 2022-06-26 ENCOUNTER — Ambulatory Visit: Payer: Medicare Other | Admitting: Pharmacist

## 2022-06-26 ENCOUNTER — Inpatient Hospital Stay: Payer: Medicare Other

## 2022-06-26 ENCOUNTER — Encounter: Payer: Self-pay | Admitting: *Deleted

## 2022-06-26 ENCOUNTER — Other Ambulatory Visit: Payer: Self-pay

## 2022-06-26 ENCOUNTER — Ambulatory Visit
Admission: RE | Admit: 2022-06-26 | Discharge: 2022-06-26 | Disposition: A | Discharge status: Home / Self Care | Payer: Medicare Other | Source: Ambulatory Visit | Attending: Radiation Oncology | Admitting: Radiation Oncology

## 2022-06-26 ENCOUNTER — Inpatient Hospital Stay (HOSPITAL_BASED_OUTPATIENT_CLINIC_OR_DEPARTMENT_OTHER): Payer: Medicare Other | Admitting: Oncology

## 2022-06-26 ENCOUNTER — Encounter: Payer: Self-pay | Admitting: Oncology

## 2022-06-26 VITALS — BP 142/68 | HR 68 | Temp 97.7°F | Resp 18 | Ht 67.0 in | Wt 183.3 lb

## 2022-06-26 DIAGNOSIS — N138 Other obstructive and reflux uropathy: Secondary | ICD-10-CM

## 2022-06-26 DIAGNOSIS — C3412 Malignant neoplasm of upper lobe, left bronchus or lung: Secondary | ICD-10-CM | POA: Insufficient documentation

## 2022-06-26 DIAGNOSIS — C3490 Malignant neoplasm of unspecified part of unspecified bronchus or lung: Secondary | ICD-10-CM

## 2022-06-26 DIAGNOSIS — Z7189 Other specified counseling: Secondary | ICD-10-CM

## 2022-06-26 DIAGNOSIS — C3492 Malignant neoplasm of unspecified part of left bronchus or lung: Secondary | ICD-10-CM

## 2022-06-26 DIAGNOSIS — Z87891 Personal history of nicotine dependence: Secondary | ICD-10-CM | POA: Insufficient documentation

## 2022-06-26 DIAGNOSIS — N390 Urinary tract infection, site not specified: Secondary | ICD-10-CM

## 2022-06-26 DIAGNOSIS — R3129 Other microscopic hematuria: Secondary | ICD-10-CM

## 2022-06-26 DIAGNOSIS — Z79899 Other long term (current) drug therapy: Secondary | ICD-10-CM | POA: Insufficient documentation

## 2022-06-26 LAB — CYTOLOGY - NON PAP

## 2022-06-26 NOTE — Progress Notes (Signed)
Care Management & Coordination Services Pharmacy Note  06/26/2022 Name:  Antonio [name] MRN:  [id number] DOB:  Mar 18, 1945  Summary: F/U visit -HTN: pt is checking BP 3x daily and range 120s/70s to 200/90. He takes lisinopril 20 mg daily (HCTZ was stopped Feb during AKI); He does take hydralazine if SBP is > 150. He has not had to take it in a few days -DM: A1c 8.8% (03/2022)); pt recently started CGM through endocrine and reports glucose is "up and down" but overall improved -NSCLC: pt is undergoing workup for malignancy with oncology currently  Recommendations/Changes made from today's visit: -No med changes -Advised to continue monitoring BP and using hydralazine PRN; consider restarting HCTZ if needed as AKI has resolved  Follow up plan: -Health Concierge will call patient 1 month for BP update -Pharmacist follow up televisit scheduled for 3 months -Oncology 06/26/22; Cardiology 07/28/22; Pulmonology 08/01/22 -Annual PCP due June-July 2024    Subjective: Antonio [name] is an 77 y.o. year old male who is a primary patient of Doreene Nest, NP.  The care coordination team was consulted for assistance with disease management and care coordination needs.    Engaged with patient by telephone for follow up visit.  Recent office visits: 05/22/22 NP Matt Cable OV: mechanical fall 2 wk ago, hematoma on leg. Xray negative for fracture.  04/14/22 Dr Milinda Antis OV: UTI - resolved. F/u urology. NSCLC - f/u pulm.  12/21/21 NP Mayra Reel OV: recent A1c 9.9%, f/u with endo. BP elevated, f/u with cardiology. Avoiding BB and nonDHP-CCB d/t hx heart block.  Recent consult visits: 06/12/22 Dr Jayme Cloud (Pulmonary): mediastinal adenopathy - metastatic disease. Order bronchoscopy. COPD - resume Trelegy. Need PFTs  04/26/22 PA McGowan (Urology): BPH - bladder scan 0. No changes.  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 F/u 6 mo for duplex US.  04/17/22 Dr  Huntley Dec (Endo): TIR 69%. No changes.  04/13/22 Dr Graciela Husbands (Cardiology): bradycardia - BP elevated. HCTZ recently stopped d/t AKI. If BP remains elevated, would resume HCTZ.  03/23/22 - [facility] changed Levemir to [facility]  02/20/22 PA Gonzalez-Hernandez (Endocrine): ordered FL3 to Aeroflow.  01/05/22 Dr Tedd Sias (Endocrine): DM - A1c 9.9%; Increase Levemir to 26 units.  01/05/22 Dr Graciela Husbands (Cardiology): f/u- c/o fatigue. Use zio patch. Orthostasis - BP dropped 130 to 105. D/C HCTZ. Start abdominal binder.   11/04/21 Dr Jayme Cloud (Pulmonology): COPD - refilled Trelegy. Ordered PFT.  Hospital visits: 06/19/22 planned admission [facility]): bronchoscopy  04/05/22 - 04/08/22 Admission [facility]): Encephalopathy, UTI. CVA ruled out. F/u urology 1 week.A1c 8.8%. Rx hydralazine 50 mg TID if BP > 150. F/u vascular 1-2 wks for carotid stenosis. NSCLC - incidental finding.  12/14/21 ED visit [facility]): UTI - rx keflex.   Objective:  Lab Results  Component Value Date   CREATININE 0.89 04/14/2022   BUN 15 04/14/2022   GFR 82.78 04/14/2022   GFRNONAA >60 04/08/2022   GFRAA >60 05/31/2018   NA 140 04/14/2022   K 4.4 04/14/2022   CALCIUM 9.0 04/14/2022   CO2 28 04/14/2022   GLUCOSE 181 (H) 04/14/2022    Lab Results  Component Value Date/Time   HGBA1C 8.8 (H) 04/05/2022 03:04 PM   HGBA1C 7.4 (A) 05/12/2021 10:38 AM   HGBA1C 6.8 (A) 12/02/2019 08:32 AM   HGBA1C 7.6 (H) 09/24/2018 08:19 AM   HGBA1C 7.6 03/16/2017 12:00 AM   GFR 82.78 04/14/2022 09:33 AM   GFR 59.99 (L) 12/02/2019 08:53 AM  MICROALBUR 6.8 (H) 12/23/2014 08:08 AM   MICROALBUR 1.3 11/12/2007 09:24 AM    Last diabetic Eye exam:  Lab Results  Component Value Date/Time   HMDIABEYEEXA Retinopathy (A) 06/20/2021 12:00 AM    Last diabetic Foot exam:  Lab Results  Component Value Date/Time   HMDIABFOOTEX Yes 11/12/2007 12:00 AM     Lab Results  Component Value Date   CHOL 138 04/06/2022   HDL 41 04/06/2022   LDLCALC 48 04/06/2022    LDLDIRECT 120.3 04/24/2011   TRIG 243 (H) 04/06/2022   CHOLHDL 3.4 04/06/2022       Latest Ref Rng & Units 04/14/2022    9:33 AM 04/06/2022    3:20 AM 04/05/2022    8:39 AM  Hepatic Function  Total Protein 6.0 - 8.3 g/dL 5.5  6.0  6.3   Albumin 3.5 - 5.2 g/dL 3.3  3.1  3.4   AST 0 - 37 U/L 21  12  19    ALT 0 - 53 U/L 23  11  13    Alk Phosphatase 39 - 117 U/L 86  47  58   Total Bilirubin 0.2 - 1.2 mg/dL 0.2  0.5  0.6     Lab Results  Component Value Date/Time   TSH 2.038 06/09/2021 11:04 AM   TSH 1.11 09/14/2017 08:30 AM   TSH 1.32 07/12/2016 09:43 AM       Latest Ref Rng & Units 04/14/2022    9:33 AM 04/08/2022    6:34 AM 04/07/2022    4:07 AM  CBC  WBC 4.0 - 10.5 K/uL 8.7  7.4  7.3   Hemoglobin 13.0 - 17.0 g/dL 16.1  09.6  04.5   Hematocrit 39.0 - 52.0 % 37.9  36.3  36.3   Platelets 150.0 - 400.0 K/uL 281.0  272  266     No results found for: "VD25OH", "VITAMINB12"  Clinical ASCVD: Yes  The ASCVD Risk score (Arnett DK, et al., 2019) failed to calculate for the following reasons:   The patient has a prior MI or stroke diagnosis       06/26/2022    1:43 PM 05/22/2022    3:34 PM 04/14/2022    9:05 AM  Depression screen PHQ 2/9  Decreased Interest 0 0 0  Down, Depressed, Hopeless 0 0 0  PHQ - 2 Score 0 0 0  Altered sleeping  0 1  Tired, decreased energy  0 1  Change in appetite  0 0  Feeling bad or failure about yourself   0 0  Trouble concentrating  0 0  Moving slowly or fidgety/restless  0 0  Suicidal thoughts  0 0  PHQ-9 Score  0 2  Difficult doing work/chores  Not difficult at all Not difficult at all     Social History   Tobacco Use  Smoking Status Former   Packs/day: 2.00   Years: 57.00   Additional pack years: 0.00   Total pack years: 114.00   Types: Cigarettes   Start date: 02/07/1964   Quit date: 05/11/2021   Years since quitting: 1.1  Smokeless Tobacco Former   Types: Chew   BP Readings from Last 3 Encounters:  06/26/22 (!) 142/68  06/19/22 (!)  151/65  06/16/22 (!) 182/94   Pulse Readings from Last 3 Encounters:  06/26/22 68  06/19/22 62  06/16/22 64   Wt Readings from Last 3 Encounters:  06/26/22 183 lb 4.8 oz (83.1 kg)  06/19/22 184 lb (83.5 kg)  06/16/22 181  lb 12.8 oz (82.5 kg)   BMI Readings from Last 3 Encounters:  06/26/22 28.71 kg/m  06/19/22 28.82 kg/m  06/16/22 28.47 kg/m    No Known Allergies  Medications Reviewed Today     Reviewed by Eduard Clos, CMA (Certified Medical Assistant) on 06/26/22 at 1335  Med List Status: <None>   Medication Order Taking? Sig Documenting Provider Last Dose Status Informant  acetaminophen (TYLENOL) 500 MG tablet [id number] Yes Take 1,000 mg by mouth every 6 (six) hours. Rapid release [provider] Taking Active Spouse/Significant Other  aspirin 81 MG EC tablet [id number] Yes Take 81 mg by mouth in the morning. [provider] Taking Active Spouse/Significant Other           Med Note Jayme Cloud, CARMEN Everlene Farrier Jun 19, 2022 11:26 AM) Start in AM 5/14  atorvastatin (LIPITOR) 20 MG tablet [id number] Yes TAKE 1 TABLET BY MOUTH IN THE EVENING FOR CHOLESTEROL  Patient taking differently: Take 20 mg by mouth in the morning.   Doreene Nest, NP Taking Active Spouse/Significant Other  clopidogrel (PLAVIX) 75 MG tablet [id number] Yes Take 1 tablet (75 mg total) by mouth daily. Schnier, Latina Craver, MD Taking Active            Med Note Jayme Cloud, CARMEN Everlene Farrier Jun 19, 2022 11:26 AM) Start in AM 5/14  ferrous gluconate [facility]) 324 MG tablet [id number] Yes TAKE 1 TABLET BY MOUTH EVERY DAY WITH BREAKFAST  Patient taking differently: Take 324 mg by mouth daily with breakfast.   Meryl Dare, MD Taking Active Spouse/Significant Other  FIBER PO [id number] Yes Take 1 capsule by mouth in the morning. [provider] Taking Active Spouse/Significant Other  finasteride (PROSCAR) 5 MG tablet [id number] Yes Take 1 tablet (5 mg total) by mouth daily.  Patient  taking differently: Take 5 mg by mouth every morning.   Michiel Cowboy A, PA-C Taking Active   fluticasone (FLONASE) 50 MCG/ACT nasal spray [id number] Yes SPRAY 2 SPRAYS INTO EACH NOSTRIL EVERY DAY  Patient taking differently: Place 2 sprays into both nostrils as needed.   Mort Sawyers, FNP Taking Active Spouse/Significant Other  Fluticasone-Umeclidin-Vilant (TRELEGY ELLIPTA) 100-62.5-25 MCG/ACT AEPB [id number] Yes Inhale 1 puff into the lungs daily. Salena Saner, MD Taking Active   Fluticasone-Umeclidin-Vilant [facility] ELLIPTA) 100-62.5-25 MCG/ACT AEPB [id number] Yes Inhale 1 puff into the lungs daily.  Patient taking differently: Inhale 1 puff into the lungs every morning.   Salena Saner, MD Taking Active   glipiZIDE (GLUCOTROL) 10 MG tablet [id number] Yes Take 10 mg by mouth daily before breakfast. [provider] Taking Active Spouse/Significant Other           Med Note Laretta Bolster, [facility]   Wed Jun 27, 2017  8:06 AM)    GLUCOSAMINE HCL PO [id number] Yes Take 1 tablet by mouth daily. [provider] Taking Active Spouse/Significant Other  hydrALAZINE (APRESOLINE) 50 MG tablet [id number] Yes Take 1 tablet (50 mg total) by mouth 3 (three) times daily as needed (If systolic BP greater than 150 mmHg.). If systolic BP greater than 150 mmHg. Tower, Audrie Gallus, MD Taking Active   Insulin Glargine [facility]) 100 UNIT/ML [id number] Yes Inject 30 Units into the skin every morning. [provider] Taking Active Spouse/Significant Other  lisinopril (ZESTRIL) 20 MG tablet [id number] Yes Take 1 tablet (20 mg total) by mouth daily.  Patient taking differently: Take 20 mg by mouth daily as needed. Patient reports taking if systolic  over 160   Tower, [place] A, MD Taking Active   metFORMIN (GLUCOPHAGE) 500 MG tablet [id number] Yes TAKE 2 TABLETS BY MOUTH TWICE A DAY WITH MEAL FOR DIABETES  Patient taking differently: Take 1,000 mg by mouth 2 (two) times daily. TAKE 2  TABLETS BY MOUTH TWICE A DAY WITH MEAL FOR DIABETES   Doreene Nest, NP Taking Active Spouse/Significant Other  pantoprazole (PROTONIX) 40 MG tablet [id number] Yes TAKE 1 TABLET (40 MG TOTAL) BY MOUTH DAILY. FOR HEARTBURN  Patient taking differently: Take 40 mg by mouth every evening. For heartburn   Doreene Nest, NP Taking Active   sildenafil (VIAGRA) 100 MG tablet [id number] Yes Take 1 tablet (100 mg total) by mouth daily as needed for erectile dysfunction. Harle Battiest, PA-C Taking Active Spouse/Significant Other  tamsulosin (FLOMAX) 0.4 MG CAPS capsule [id number] Yes Take 1 capsule (0.4 mg total) by mouth 2 (two) times daily. Harle Battiest, PA-C Taking Active   vitamin B-12 (CYANOCOBALAMIN) 1000 MCG tablet [id number] Yes Take 1,000 mcg by mouth in the morning. [provider] Taking Active Spouse/Significant Other            SDOH:  (Social Determinants of Health) assessments and interventions performed: No SDOH Interventions    Flowsheet Row Telephone from 04/10/2022 in [facility] Community Care Coordination Clinical Support from 01/24/2022 in [facility] Chronic Care Management from 03/28/2021 in [facility] Clinical Support from 01/20/2021 in [facility] Chronic Care Management from 06/24/2020 in [facility] at [facility] Visit from 12/02/2019 in [facility] [place]  SDOH Interventions        Food Insecurity Interventions Intervention Not Indicated Intervention Not Indicated -- -- -- --  Housing Interventions Intervention Not Indicated Intervention Not Indicated -- -- -- --  Transportation Interventions Intervention Not Indicated Intervention Not Indicated -- -- -- --  Utilities Interventions -- Intervention Not Indicated -- -- -- --  Alcohol Usage Interventions -- Intervention Not Indicated  (Score <7) -- -- -- --  Depression Interventions/Treatment  -- -- -- -- -- Counseling  Financial Strain Interventions -- Intervention Not Indicated Intervention Not Indicated Other (Comment)  [CCR] Intervention Not Indicated --  Physical Activity Interventions -- Intervention Not Indicated -- -- -- --  Stress Interventions -- Intervention Not Indicated -- -- -- --  Social Connections Interventions -- Intervention Not Indicated -- -- -- --       Medication Assistance: None required.  Patient affirms current coverage meets needs.  Medication Access: Within the past 30 days, how often has patient missed a dose of medication? 0 Is a pillbox or other method used to improve adherence? Yes  Factors that may affect medication adherence? lack of understanding of disease management Are meds synced by current pharmacy? No  Are meds delivered by current pharmacy? No  Does patient experience delays in picking up medications due to transportation concerns? No   [facility] Services Reviewed: Is patient disadvantaged to use [facility]?: Yes  Current Rx insurance plan: [facility] of [facility]  [facility] [phone] [place] - [address] [place] [address] Phone: [phone] Fax: [phone]  [facility] services reviewed with patient today?: No  Patient requests to transfer care to [facility] Pharm[facility]?: No  Reason patient declined to change pharmacies: Disadvantaged due to insurance/mail order  Compliance/Adherence/Medication fill history: Care Gaps:  UACR (due 12/2015)  Star-Rating Drugs: Atorvastatin - PDC 96% Glipizide - PDC 92% Metformin - PDC 99% Lisinopril - PDC 100%   ASSESSMENT / PLAN  Hypertension (BP goal <140/90) -Not ideally controlled - pt reports he is checking BP 3 x daily; he will take hydralazine if SBP > 150 and has not needed it in a few days -pt had issues with orthostatic hypotension at OV in Nov 2023,  this has mostly resolved -Current home readings: 122/73, 204/85, 144/62 -Current treatment: Lisinopril 20 mg daily- Appropriate, Query Effective Hydralazine 50 mg TID prn > SBP 150 - Appropriate, Effective, Safe, Accessible -Medications previously tried: rampril, amlodipine, hctz (AKI), metoprolol -Counseled to monitor BP at home 3x daily -Recommended to continue current medication; consider restarting HCTZ as AKI as resolved  Diabetes (A1c goal <7%) -Uncontrolled - A1c 8.8% (03/2022) improved from 9.9%; pt recently started CGM and has enjoyed it 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) -Current home glucose readings:  -Denies hypoglycemic/hyperglycemic symptoms -Current medications: Glipizide 10 mg daily PM - Appropriate, Query Effective Basaglar 30 units HS -Appropriate, Query Effective Metformin 500 mg - 2 tablets BID -Appropriate, Query Effective Freestyle Libre 3 (reader)- Appropriate, Effective, Safe, Accessible -Medications previously tried: none reported -Educated on A1c and blood sugar goals; -Pt aware bread, pasta will increase glucose -Recommended to continue current medication  Hyperlipidemia: (LDL goal < 70) -Query Controlled - LDL 37 (11/2019) at goal though pt is overdue for reassessment; he does endorse compliance with statin -Hx PAD, aortic atherosclerosis -Current treatment: Atorvastatin 20 mg daily - Appropriate, Effective, Safe, Accessible Clopidogrel 75 mg daily - Appropriate, Effective, Safe, Accessible Aspirin 81 mg daily - Appropriate, Effective, Safe, Accessible -Educated on Cholesterol goals;  -Recommended to continue current medication  Tobacco use (Goal: Tobacco free) -Pt quit smoking April 2023, congratulated.  Health Maintenance -Vaccine gaps: Shingrix, TDAP -Hx NSCLC - Dx 05/2021. S/p radiation.  -Hx GERD, on PPI -Hx BPH, on flomax   Al Corpus, PharmD, BCACP Clinical Pharmacist [facility] [phone]

## 2022-06-26 NOTE — Progress Notes (Signed)
Met with patient during initial consult with Dr. Smith Robert to discuss recent biopsy results and treatment options. All questions answered during visit. Reviewed upcoming appts. Informed that will call with appt for port placement once scheduled. Contact info given and instructed to call with any questions or needs. Pt verbalized understanding.

## 2022-06-27 ENCOUNTER — Other Ambulatory Visit: Payer: Medicare Other

## 2022-06-27 DIAGNOSIS — N138 Other obstructive and reflux uropathy: Secondary | ICD-10-CM

## 2022-06-27 DIAGNOSIS — N390 Urinary tract infection, site not specified: Secondary | ICD-10-CM | POA: Diagnosis not present

## 2022-06-27 DIAGNOSIS — R3129 Other microscopic hematuria: Secondary | ICD-10-CM

## 2022-06-27 DIAGNOSIS — N401 Enlarged prostate with lower urinary tract symptoms: Secondary | ICD-10-CM | POA: Diagnosis not present

## 2022-06-27 LAB — URINALYSIS, COMPLETE
Bilirubin, UA: NEGATIVE
Ketones, UA: NEGATIVE
Nitrite, UA: NEGATIVE
RBC, UA: NEGATIVE
Specific Gravity, UA: >1.03 — ABNORMAL HIGH (ref 1.005–1.030)
Urobilinogen, Ur: 0.2 mg/dL (ref 0.2–1.0)
pH, UA: 5 (ref 5.0–7.5)

## 2022-06-27 LAB — MICROSCOPIC EXAMINATION

## 2022-06-27 NOTE — Progress Notes (Signed)
Radiation Oncology Follow up Note  Name: Antonio [name]   Date:   06/26/2022 MRN:  [id number] DOB: [birth year]    This 77 y.o. 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  He recently had a CT scan showing increased areas.Of disease with left-sided mediastinal hilar adenopathy and some tracking of soft tissue along the left mainstem bronchus.  PET CT scan was ordered showing 2 new hypermetabolic left paratracheal mediastinal nodes.  There was no focal hypermetabolic activity the site of the treated posterior central left upper lobe pulmonary nodule.  No other extrathoracic or skeletal metastatic metastasis was noted.  He underwent EBUS and fine-needle aspiration was positive for malignancy of the left paratracheal lymph node.  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  He has been seen by medical oncology and is now seen back by radiation for consideration of concurrent chemoradiation.  He is fairly asymptomatic.  COMPLICATIONS OF TREATMENT: none  FOLLOW UP COMPLIANCE: keeps appointments   PHYSICAL EXAM:  There were no vitals taken for this visit. Well-developed well-nourished patient in NAD. HEENT reveals PERLA, EOMI, discs not visualized.  Oral cavity is clear. No oral mucosal lesions are identified. Neck is clear without evidence of cervical or supraclavicular adenopathy. Lungs are clear to A&P. Cardiac examination is essentially unremarkable with regular rate and rhythm without murmur rub or thrill. Abdomen is benign with no organomegaly or masses noted. Motor sensory and DTR levels are equal and symmetric in the upper and lower extremities. Cranial nerves II  through XII are grossly intact. Proprioception is intact. No peripheral adenopathy or edema is identified. No motor or sensory levels are noted. Crude visual fields are within normal range.  RADIOLOGY RESULTS: PET CT scan again reviewed  PLAN: At this time like to go ahead with IMRT radiation therapy to his PET positive nodes in his mediastinum.  I would choose IMRT based on previous SBRT as well as close proximity to his chest vasculature heart spinal cord and esophagus.  Risks and benefits of treatment including possible radiation esophagitis fatigue alteration of normal lung volume skin reaction fatigue alteration blood counts all were reviewed in detail with the patient.  I have set him up for simulation next week.  Would plan on delivering 72 Gray over 6-1/[redacted] weeks along with weekly concurrent chemotherapy.  There will be extra effort by both professional staff as well as technical staff to coordinate and manage concurrent chemoradiation and ensuing side effects during his treatments. Patient comprehends my recommendations well.  I would like to take this opportunity to thank you for allowing me to participate in the care of your patient.Carmina Miller, MD

## 2022-06-28 DIAGNOSIS — Z87891 Personal history of nicotine dependence: Secondary | ICD-10-CM | POA: Diagnosis not present

## 2022-06-28 DIAGNOSIS — C3412 Malignant neoplasm of upper lobe, left bronchus or lung: Secondary | ICD-10-CM | POA: Diagnosis not present

## 2022-06-29 ENCOUNTER — Other Ambulatory Visit: Payer: Self-pay | Admitting: Radiology

## 2022-06-29 DIAGNOSIS — Z01812 Encounter for preprocedural laboratory examination: Secondary | ICD-10-CM

## 2022-06-29 NOTE — Progress Notes (Signed)
Patient for IR Port Insertion on Friday 06/30/2022, I called and LVM for the patient on the phone and gave pre-procedure instructions. VM made pt  aware to be here at 12:30p, NPO after MN prior to procedure as well as driver post procedure/recovery/discharge.   Called 06/29/2022

## 2022-06-30 ENCOUNTER — Encounter: Payer: Self-pay | Admitting: Radiology

## 2022-06-30 ENCOUNTER — Ambulatory Visit
Admission: RE | Admit: 2022-06-30 | Discharge: 2022-06-30 | Disposition: A | Discharge status: Home / Self Care | Payer: Medicare Other | Source: Ambulatory Visit | Attending: Oncology | Admitting: Oncology

## 2022-06-30 ENCOUNTER — Encounter: Payer: Self-pay | Admitting: Oncology

## 2022-06-30 ENCOUNTER — Other Ambulatory Visit: Payer: Self-pay

## 2022-06-30 DIAGNOSIS — Z452 Encounter for adjustment and management of vascular access device: Secondary | ICD-10-CM | POA: Diagnosis not present

## 2022-06-30 DIAGNOSIS — C3490 Malignant neoplasm of unspecified part of unspecified bronchus or lung: Secondary | ICD-10-CM | POA: Diagnosis not present

## 2022-06-30 DIAGNOSIS — C3492 Malignant neoplasm of unspecified part of left bronchus or lung: Secondary | ICD-10-CM | POA: Diagnosis not present

## 2022-06-30 DIAGNOSIS — Z87891 Personal history of nicotine dependence: Secondary | ICD-10-CM | POA: Diagnosis not present

## 2022-06-30 DIAGNOSIS — Z01812 Encounter for preprocedural laboratory examination: Secondary | ICD-10-CM

## 2022-06-30 HISTORY — PX: IR IMAGING GUIDED PORT INSERTION: IMG5740

## 2022-06-30 LAB — GLUCOSE, CAPILLARY
Glucose-Capillary: 109 mg/dL — ABNORMAL HIGH (ref 70–99)
Glucose-Capillary: 97 mg/dL (ref 70–99)

## 2022-06-30 MED ORDER — HEPARIN SOD (PORK) LOCK FLUSH 100 UNIT/ML IV SOLN
500.0000 [IU] | Freq: Once | INTRAVENOUS | Status: AC
  Administered 2022-06-30: 500 [IU] via INTRAVENOUS

## 2022-06-30 MED ORDER — MIDAZOLAM HCL 2 MG/2ML IJ SOLN
INTRAMUSCULAR | Status: AC | PRN
  Administered 2022-06-30: 1 mg via INTRAVENOUS
  Administered 2022-06-30: .5 mg via INTRAVENOUS

## 2022-06-30 MED ORDER — FENTANYL CITRATE (PF) 100 MCG/2ML IJ SOLN
INTRAMUSCULAR | Status: AC | PRN
  Administered 2022-06-30: 25 ug via INTRAVENOUS
  Administered 2022-06-30: 50 ug via INTRAVENOUS

## 2022-06-30 MED ORDER — HEPARIN SOD (PORK) LOCK FLUSH 100 UNIT/ML IV SOLN
INTRAVENOUS | Status: AC
  Filled 2022-06-30: qty 5

## 2022-06-30 MED ORDER — DEXAMETHASONE 4 MG PO TABS
ORAL_TABLET | ORAL | 1 refills | Status: DC
Start: 2022-06-30 — End: 2022-09-14

## 2022-06-30 MED ORDER — LIDOCAINE-EPINEPHRINE 1 %-1:100000 IJ SOLN
20.0000 mL | Freq: Once | INTRAMUSCULAR | Status: AC
  Administered 2022-06-30: 13 mL via INTRADERMAL

## 2022-06-30 MED ORDER — PROCHLORPERAZINE MALEATE 10 MG PO TABS
10.0000 mg | ORAL_TABLET | Freq: Four times a day (QID) | ORAL | 1 refills | Status: DC | PRN
Start: 2022-06-30 — End: 2022-09-14

## 2022-06-30 MED ORDER — SODIUM CHLORIDE 0.9 % IV SOLN: INTRAVENOUS | Status: DC

## 2022-06-30 MED ORDER — LIDOCAINE-PRILOCAINE 2.5-2.5 % EX CREA
TOPICAL_CREAM | CUTANEOUS | 3 refills | Status: DC
Start: 2022-06-30 — End: 2022-07-04

## 2022-06-30 MED ORDER — LIDOCAINE-EPINEPHRINE 1 %-1:100000 IJ SOLN
INTRAMUSCULAR | Status: AC
  Filled 2022-06-30: qty 1

## 2022-06-30 MED ORDER — FENTANYL CITRATE (PF) 100 MCG/2ML IJ SOLN
INTRAMUSCULAR | Status: AC
  Filled 2022-06-30: qty 4

## 2022-06-30 MED ORDER — MIDAZOLAM HCL 2 MG/2ML IJ SOLN
INTRAMUSCULAR | Status: AC
  Filled 2022-06-30: qty 4

## 2022-06-30 MED ORDER — ONDANSETRON HCL 8 MG PO TABS
8.0000 mg | ORAL_TABLET | Freq: Three times a day (TID) | ORAL | 1 refills | Status: DC | PRN
Start: 2022-06-30 — End: 2022-09-14

## 2022-06-30 NOTE — H&P (Signed)
Chief Complaint: Patient was seen in consultation today for image-guided port placement  Referring Physician(s): Rao,Archana C  Supervising Physician: Pernell Dupre  Patient Status: [facility] - Out-pt  History of Present Illness: Antonio [name] is a 77 y.o. male with complex PMH including anemia, atrial fibrillation, bilateral carotid artery stenosis, emphysema, diabetes mellitus, peripheral vascular disease, and non-small cell lung cancer. Patient was initially diagnosed with Stage I left upper lobe non-small cell lung cancer approximately one year ago. He underwent SBRT and is now stage III non-small cell lung cancer. Patient has been referred to IR for image-guided port placement to facilitate chemotherapy.  Past Medical History:  Diagnosis Date   Adenoma of left adrenal gland    Anemia    Aortic atherosclerosis (HCC)    Arthritis    Atrial fibrillation and flutter (HCC)    a.) CHA2DS2VASc = 7 (age x 2, HTN, CVA x 2, vascular disease history, T2DM);  b.) rate/rhythm maintained without pharmacological intervention; chronic antithrombotic therapy using ASA + clopidogrel   Bilateral carotid artery stenosis    a.) s/p PTA 04/07/2022 --> 90% RICA --> 9x7x30 Exact stent; b.) doppler 03/14/20214: 1-39% RICA, 40-59% LICA   BPH with urinary obstruction    stable on flomax ([name]   Childhood asthma    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   a.) CT head and MRI brain 04/05/2022: chronic cortical/subcortical RIGHT MCA territory infarct within the RIGHT parietal lobe and RIGHT temporoparietal junction   Coronary atherosclerosis of unspecified type of vessel, native or graft    a.) cCTA 05/22/2021: Ca score 1524 (84th percentile for age/sex match control) --> FFRct: dLAD = 0.77, dLCx = 0.67, RPLA = 0.77, dOM1 = 0.66; b.) LHC 05/23/2021: 20% dRCA, 50% OM1 - med mgmt   Diastolic dysfunction    a.) TTE 04/06/2022: EF 60-65%, mod LVH, mild MR, AoV sclerosis, G1DD   DOE (dyspnea on  exertion)    Emphysema of lung (HCC)    Erectile dysfunction    a.) on PDE5i (sildenafil)   Esophageal stricture    Fall    Gastric AVM    Gastritis    GERD (gastroesophageal reflux disease)    Hiatal hernia    Hx of colonic polyps    Hyperlipidemia    Hypertension    Internal hemorrhoids without mention of complication    Ischemic leg    Long term current use of antithrombotics/antiplatelets    a.) on DAPT (ASA + clopidogrel)   Lumbago    Mobitz (type) I (Wenckebach's) atrioventricular block    Non-small cell lung cancer (HCC)    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   a.) PCR (+) 05/29/2018, 04/07/2022   Pneumonia    PSA elevation    now averaging 2's   PVD (peripheral vascular disease) with claudication (HCC)    Sigmoid diverticulosis    Tobacco abuse    Type 2 diabetes mellitus treated with insulin [facility])     Past Surgical History:  Procedure Laterality Date   2D Echo  07/2001   BACK SURGERY     CAROTID PTA/STENT INTERVENTION Right 04/07/2022   Procedure: CAROTID PTA/STENT INTERVENTION;  Surgeon: Renford Dills, MD;  Location: [facility] INVASIVE CV LAB;  Service: Cardiovascular;  Laterality: Right;   COLONOSCOPY     ENDOBRONCHIAL ULTRASOUND Left 06/19/2022   Procedure: ENDOBRONCHIAL ULTRASOUND;  Surgeon: Salena Saner, MD;  Location: [facility] ORS;  Service: Pulmonary;  Laterality: Left;   ESOPHAGOGASTRODUODENOSCOPY  02/13/2007   gastritis and duodenitis  without bleed   ESOPHAGOGASTRODUODENOSCOPY N/A 06/26/2014   Procedure: ESOPHAGOGASTRODUODENOSCOPY (EGD);  Surgeon: Rachael Fee, MD;  Location: [facility] ENDOSCOPY;  Service: Endoscopy;  Laterality: N/A;   ESOPHAGOGASTRODUODENOSCOPY (EGD) WITH PROPOFOL N/A 04/08/2020   Procedure: ESOPHAGOGASTRODUODENOSCOPY (EGD) WITH PROPOFOL;  Surgeon: Meryl Dare, MD;  Location: [facility] ENDOSCOPY;  Service: Endoscopy;  Laterality: N/A;   FLEXIBLE BRONCHOSCOPY Left 06/19/2022   Procedure: FLEXIBLE BRONCHOSCOPY;  Surgeon: Salena Saner,  MD;  Location: [facility] ORS;  Service: Pulmonary;  Laterality: Left;   HOT HEMOSTASIS N/A 04/08/2020   Procedure: HOT HEMOSTASIS (ARGON PLASMA COAGULATION/BICAP);  Surgeon: Meryl Dare, MD;  Location: [facility] ENDOSCOPY;  Service: Endoscopy;  Laterality: N/A;   KNEE ARTHROSCOPY Right 11/1998   LEFT HEART CATH AND CORONARY ANGIOGRAPHY N/A 05/23/2021   Procedure: LEFT HEART CATH AND CORONARY ANGIOGRAPHY;  Surgeon: Orbie Pyo, MD;  Location: [facility] INVASIVE CV LAB;  Service: Cardiovascular;  Laterality: N/A;   LOWER EXTREMITY ANGIOGRAPHY Right 12/27/2017   Procedure: LOWER EXTREMITY ANGIOGRAPHY;  Surgeon: Annice Needy, MD;  Location: [facility] INVASIVE CV LAB;  Service: Cardiovascular;  Laterality: Right;   LOWER EXTREMITY ANGIOGRAPHY Left 01/10/2018   Procedure: LOWER EXTREMITY ANGIOGRAPHY;  Surgeon: Annice Needy, MD;  Location: [facility] INVASIVE CV LAB;  Service: Cardiovascular;  Laterality: Left;   LOWER EXTREMITY ANGIOGRAPHY Left 05/29/2018   Procedure: LOWER EXTREMITY ANGIOGRAPHY;  Surgeon: Annice Needy, MD;  Location: [facility] INVASIVE CV LAB;  Service: Cardiovascular;  Laterality: Left;   LOWER EXTREMITY ANGIOGRAPHY Left 05/30/2018   Procedure: Lower Extremity Angiography;  Surgeon: Annice Needy, MD;  Location: [facility] INVASIVE CV LAB;  Service: Cardiovascular;  Laterality: Left;   LP  07/2001   Microwave thermotherapy prostate  08/28/2007   [place]   Persantine cardiolite  04/1999   EF 68%   POLYPECTOMY  12/1993   Stress myoview  08/2004   EF 67%   UPPER GASTROINTESTINAL ENDOSCOPY      Allergies: Patient has no known allergies.  Medications: Prior to Admission medications   Medication Sig Start Date End Date Taking? Authorizing Provider  acetaminophen (TYLENOL) 500 MG tablet Take 1,000 mg by mouth every 6 (six) hours. Rapid release   Yes [provider]  aspirin 81 MG EC tablet Take 81 mg by mouth in the morning.   Yes [provider]  atorvastatin (LIPITOR) 20 MG tablet TAKE 1  TABLET BY MOUTH IN THE EVENING FOR CHOLESTEROL Patient taking differently: Take 20 mg by mouth in the morning. 01/21/22  Yes Doreene Nest, NP  clopidogrel (PLAVIX) 75 MG tablet Take 1 tablet (75 mg total) by mouth daily. 04/20/22  Yes Schnier, Latina Craver, MD  ferrous gluconate (FERGON) 324 MG tablet TAKE 1 TABLET BY MOUTH EVERY DAY WITH BREAKFAST Patient taking differently: Take 324 mg by mouth daily with breakfast. 07/26/21  Yes Meryl Dare, MD  FIBER PO Take 1 capsule by mouth in the morning.   Yes [provider]  finasteride (PROSCAR) 5 MG tablet Take 1 tablet (5 mg total) by mouth daily. Patient taking differently: Take 5 mg by mouth every morning. 04/26/22  Yes McGowan, Carollee Herter A, PA-C  fluticasone (FLONASE) 50 MCG/ACT nasal spray SPRAY 2 SPRAYS INTO EACH NOSTRIL EVERY DAY Patient taking differently: Place 2 sprays into both nostrils as needed. 10/28/21  Yes Dugal, Wyatt Mage, FNP  glipiZIDE (GLUCOTROL) 10 MG tablet Take 10 mg by mouth daily before breakfast. 07/05/14  Yes [provider]  GLUCOSAMINE HCL PO Take 1 tablet by  mouth daily.   Yes [provider]  hydrALAZINE (APRESOLINE) 50 MG tablet Take 1 tablet (50 mg total) by mouth 3 (three) times daily as needed (If systolic BP greater than 150 mmHg.). If systolic BP greater than 150 mmHg. 04/14/22  Yes Tower, Audrie Gallus, MD  Insulin Glargine (BASAGLAR KWIKPEN) 100 UNIT/ML Inject 30 Units into the skin every morning.   Yes [provider]  lisinopril (ZESTRIL) 20 MG tablet Take 1 tablet (20 mg total) by mouth daily. Patient taking differently: Take 20 mg by mouth daily as needed. Patient reports taking if systolic over 160 04/14/22  Yes Tower, Idamae Schuller A, MD  metFORMIN (GLUCOPHAGE) 500 MG tablet TAKE 2 TABLETS BY MOUTH TWICE A DAY WITH MEAL FOR DIABETES Patient taking differently: Take 1,000 mg by mouth 2 (two) times daily. TAKE 2 TABLETS BY MOUTH TWICE A DAY WITH MEAL FOR DIABETES 12/30/19  Yes Doreene Nest, NP  pantoprazole (PROTONIX) 40 MG tablet TAKE 1 TABLET (40 MG TOTAL) BY MOUTH DAILY. FOR HEARTBURN Patient taking differently: Take 40 mg by mouth every evening. For heartburn 05/31/22  Yes Doreene Nest, NP  tamsulosin (FLOMAX) 0.4 MG CAPS capsule Take 1 capsule (0.4 mg total) by mouth 2 (two) times daily. 04/26/22  Yes McGowan, Carollee Herter A, PA-C  vitamin B-12 (CYANOCOBALAMIN) 1000 MCG tablet Take 1,000 mcg by mouth in the morning.   Yes [provider]  dexamethasone (DECADRON) 4 MG tablet Take 2 tablets daily for 2 days, start the day after chemotherapy. Take with food. 06/30/22   Creig Hines, MD  Fluticasone-Umeclidin-Vilant (TRELEGY ELLIPTA) 100-62.5-25 MCG/ACT AEPB Inhale 1 puff into the lungs daily. 06/12/22   Salena Saner, MD  Fluticasone-Umeclidin-Vilant (TRELEGY ELLIPTA) 100-62.5-25 MCG/ACT AEPB Inhale 1 puff into the lungs daily. Patient taking differently: Inhale 1 puff into the lungs every morning. 06/12/22   Salena Saner, MD  lidocaine-prilocaine (EMLA) cream Apply to affected area once 06/30/22   Creig Hines, MD  ondansetron (ZOFRAN) 8 MG tablet Take 1 tablet (8 mg total) by mouth every 8 (eight) hours as needed for nausea or vomiting. Start on the third day after chemotherapy. 06/30/22   Creig Hines, MD  prochlorperazine (COMPAZINE) 10 MG tablet Take 1 tablet (10 mg total) by mouth every 6 (six) hours as needed for nausea or vomiting. 06/30/22   Creig Hines, MD  sildenafil (VIAGRA) 100 MG tablet Take 1 tablet (100 mg total) by mouth daily as needed for erectile dysfunction. 04/27/21   Harle Battiest, PA-C     Family History  Problem Relation Age of Onset   Rheum arthritis Mother    Diabetes Father    Alcohol abuse Paternal Uncle    Alcohol abuse Paternal Uncle    Alcohol abuse Paternal Uncle    Heart disease Neg Hx    Stroke Neg Hx    Cancer Neg Hx    Drug abuse Neg Hx    Depression Neg Hx    Colon cancer Neg Hx    Rectal cancer Neg Hx     Stomach cancer Neg Hx    Kidney cancer Neg Hx    Bladder Cancer Neg Hx    Prostate cancer Neg Hx     Social History   Socioeconomic History   Marital status: Widowed    Spouse name: Armando Reichert   Number of children: 3   Years of education: Not on file   Highest education level: Not on file  Occupational History  Occupation: Retired Music therapist  Tobacco Use   Smoking status: Former    Packs/day: 2.00    Years: 57.00    Additional pack years: 0.00    Total pack years: 114.00    Types: Cigarettes    Start date: 02/07/1964    Quit date: 05/11/2021    Years since quitting: 1.1   Smokeless tobacco: Former    Types: Associate Professor Use: Never used  Substance and Sexual Activity   Alcohol use: Yes    Comment: rare   Drug use: No   Sexual activity: Yes  Other Topics Concern   Not on file  Social History Narrative   Married 9/09 wife with moderate memory problems.   3 adult children, 2 grandchildren   Would desire CPR   Social Determinants of Health   Financial Resource Strain: Low Risk  (01/24/2022)   Overall Financial Resource Strain (CARDIA)    Difficulty of Paying Living Expenses: Not hard at all  Food Insecurity: No Food Insecurity (06/26/2022)   Hunger Vital Sign    Worried About Running Out of Food in the Last Year: Never true    Ran Out of Food in the Last Year: Never true  Transportation Needs: No Transportation Needs (06/26/2022)   PRAPARE - Administrator, Civil Service (Medical): No    Lack of Transportation (Non-Medical): No  Physical Activity: Insufficiently Active (01/24/2022)   Exercise Vital Sign    Days of Exercise per Week: 3 days    Minutes of Exercise per Session: 30 min  Stress: No Stress Concern Present (01/24/2022)   [employer] of Occupational Health - Occupational Stress Questionnaire    Feeling of Stress : Not at all  Social Connections: Moderately Isolated (01/24/2022)   Social Connection and Isolation Panel [NHANES]     Frequency of Communication with Friends and Family: More than three times a week    Frequency of Social Gatherings with Friends and Family: Once a week    Attends Religious Services: More than 4 times per year    Active Member of [employer] or Organizations: No    Attends Banker Meetings: Never    Marital Status: Widowed    Code Status: Full code  Review of Systems: A 12 point ROS discussed and pertinent positives are indicated in the HPI above.  All other systems are negative.  Review of Systems  Constitutional:  Negative for chills and fever.  Respiratory:  Negative for chest tightness and shortness of breath.   Cardiovascular:  Negative for chest pain and leg swelling.  Gastrointestinal:  Positive for diarrhea. Negative for nausea and vomiting.  Neurological:  Negative for dizziness and headaches.  Psychiatric/Behavioral:  Negative for confusion.     Vital Signs: BP (!) 183/87   Pulse 76   Resp 14   Ht 5\' 7"  (1.702 m)   Wt 184 lb (83.5 kg)   SpO2 97%   BMI 28.82 kg/m   Advance Care Plan: The advanced care plan/surrogate decision maker was discussed at the time of visit and documented in the medical record.    Physical Exam Vitals reviewed.  Constitutional:      General: He is not in acute distress.    Appearance: He is ill-appearing.  HENT:     Mouth/Throat:     Mouth: Mucous membranes are moist.  Cardiovascular:     Rate and Rhythm: Normal rate and regular rhythm.     Pulses: Normal pulses.  Heart sounds: Normal heart sounds.  Pulmonary:     Effort: Pulmonary effort is normal.     Breath sounds: Normal breath sounds.  Abdominal:     Tenderness: There is no abdominal tenderness.  Musculoskeletal:     Right lower leg: Edema present.     Left lower leg: Edema present.     Comments: 1+ pitting edema bilaterally  Skin:    General: Skin is warm and dry.  Neurological:     Mental Status: He is alert and oriented to person, place, and time.   Psychiatric:        Mood and Affect: Mood normal.        Behavior: Behavior normal.        Thought Content: Thought content normal.        Judgment: Judgment normal.     Imaging: DG Chest Port 1 View  Result Date: 06/19/2022 CLINICAL DATA:  Status post bronchoscopic biopsy EXAM: PORTABLE CHEST 1 VIEW COMPARISON:  Previous studies including the CT chest done on 05/11/2022 FINDINGS: Transverse diameter of heart is increased. Patchy infiltrate is seen in left mid lung field suggesting atelectasis/pneumonia. There is interval increase in interstitial markings in right parahilar region and right lower lung field. There is poor inspiration. Lateral CP angles are clear. There is no pneumothorax. IMPRESSION: There is no pneumothorax. Patchy infiltrate in left mid lung field may suggest atelectasis/pneumonia. There is crowding of markings in right lung which may be due to poor inspiration or suggest interstitial pneumonia. Short-term follow-up chest radiographs with better inspiration may be contributed. Electronically Signed   By: Ernie Avena M.D.   On: 06/19/2022 15:17    Labs:  CBC: Recent Labs    04/06/22 0320 04/07/22 0407 04/08/22 0634 04/14/22 0933  WBC 11.7* 7.3 7.4 8.7  HGB 12.8* 11.7* 12.0* 12.3*  HCT 38.5* 36.3* 36.3* 37.9*  PLT 314 266 272 281.0    COAGS: Recent Labs    04/05/22 0839 04/06/22 1221  INR 1.1 1.0  APTT 30 30    BMP: Recent Labs    04/05/22 1504 04/06/22 0320 04/07/22 0407 04/08/22 0634 04/14/22 0933  NA  --  139 140 138 140  K  --  3.5 3.5 3.5 4.4  CL  --  104 107 107 105  CO2  --  26 24 22 28   GLUCOSE  --  143* 108* 140* 181*  BUN  --  31* 33* 17 15  CALCIUM  --  8.3* 8.1* 8.2* 9.0  CREATININE 1.12 1.46* 1.17 0.83 0.89  GFRNONAA >60 49* >60 >60  --     LIVER FUNCTION TESTS: Recent Labs    12/14/21 0926 04/05/22 0839 04/06/22 0320 04/14/22 0933  BILITOT 0.9 0.6 0.5 0.2  AST 37 19 12* 21  ALT 23 13 11 23   ALKPHOS 48 58 47 86   PROT 7.0 6.3* 6.0* 5.5*  ALBUMIN 3.6 3.4* 3.1* 3.3*    TUMOR MARKERS: No results for input(s): "AFPTM", "CEA", "CA199", "CHROMGRNA" in the last 8760 hours.  Assessment and Plan:  Quinnlan Hardnett is a 77 yo male being seen today in relation to non-small cell lung cancer. Patient has been referred to IR for image-guided port placement. Case has been reviewed with Dr Juliette Alcide and is set to proceed on 06/30/22. Patient presents in his usual state of health and is NPO.  Risks and benefits of image guided port-a-catheter placement was discussed with the patient including, but not limited to bleeding, infection, pneumothorax, or  fibrin sheath development and need for additional procedures.  All of the patient's questions were answered, patient is agreeable to proceed. Consent signed and in chart.    Thank you for this interesting consult.  I greatly enjoyed meeting DAQUAIN CONTORNO and look forward to participating in their care.  A copy of this report was sent to the requesting provider on this date.  Electronically Signed: Kennieth Francois, PA-C 06/30/2022, 1:12 PM   I spent a total of  30 Minutes   in face to face in clinical consultation, greater than 50% of which was counseling/coordinating care for image-guided port placement.

## 2022-06-30 NOTE — Progress Notes (Signed)
Hematology/Oncology Consult note [facility] [phone] [phone]  Patient Care Team: Doreene Nest, NP as PCP - General (Internal Medicine) Debbe Odea, MD as PCP - Cardiology (Cardiology) Duke Salvia, MD as PCP - Electrophysiology (Cardiology) Hulan Fray as Physician Assistant (Urology) Meryl Dare, MD as Consulting Physician (Gastroenterology) Kathyrn Sheriff, [facility] as Pharmacist (Pharmacist) Glory Buff, RN as Oncology Nurse Navigator   Name of the patient: Antonio [name]  [id number]  05/03/1945    Reason for referral-recurrent lung cancer   Referring physician-Dr. Rushie Chestnut  Date of visit: 06/30/22   History of presenting illness- patient is a 77 year old male who was diagnosed with stage I non-small cell lung cancer involving the left upper lobe about 1 year ago and he received SBRT for the same.  He has been undergoing surveillance CT scans since then.  CT chest in April 2024 showed increasing areas of disease with left-sided mediastinal and hilar lymph nodes and soft tissue along the left main bronchus.  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  No residual focal hypermetabolism in the treated area of the left upper lobe.  No evidence of distant metastatic disease.  He underwent MRI brain without contrast in February 2024 which did not show any evidence of metastatic disease.  Patient was seen by Dr. Jayme Cloud and underwent EBUS guided biopsies left paratracheal lymph node was biopsied and consistent with pulmonary adenocarcinoma.  He has been referred for further management.  Patient currently feels well.  He has baseline fatigue and some exertional shortness of breath.  Denies any new complaints at this time  ECOG PS- 1  Pain scale- 0   Review of systems- Review of Systems  Constitutional:   Positive for malaise/fatigue. Negative for chills, fever and weight loss.  HENT:  Negative for congestion, ear discharge and nosebleeds.   Eyes:  Negative for blurred vision.  Respiratory:  Positive for shortness of breath. Negative for cough, hemoptysis, sputum production and wheezing.   Cardiovascular:  Negative for chest pain, palpitations, orthopnea and claudication.  Gastrointestinal:  Negative for abdominal pain, blood in stool, constipation, diarrhea, heartburn, melena, nausea and vomiting.  Genitourinary:  Negative for dysuria, flank pain, frequency, hematuria and urgency.  Musculoskeletal:  Negative for back pain, joint pain and myalgias.  Skin:  Negative for rash.  Neurological:  Negative for dizziness, tingling, focal weakness, seizures, weakness and headaches.  Endo/Heme/Allergies:  Does not bruise/bleed easily.  Psychiatric/Behavioral:  Negative for depression and suicidal ideas. The patient does not have insomnia.     No Known Allergies  Patient Active Problem List   Diagnosis Date Noted   Mediastinal adenopathy 06/19/2022   Hematoma 05/22/2022   Right leg pain 05/22/2022   Bilateral carotid artery stenosis 04/06/2022   Stroke-like symptoms 04/06/2022   Encephalopathy 04/05/2022   UTI (urinary tract infection) 04/05/2022   History of CVA (cerebrovascular accident) 04/05/2022   Fatigue 12/21/2021   Hearing loss of right ear due to cerumen impaction 10/05/2021   Non-small cell lung cancer (HCC) 06/19/2021   Aortic atherosclerosis (HCC) 06/19/2021   Atrioventricular block, Mobitz type 1, Wenckebach 05/25/2021   Complete heart block (HCC) 05/20/2021   Gastric AVM    Iron deficiency anemia    Right buttock pain 01/12/2020   Fall 01/12/2020   Rash and nonspecific skin eruption 12/19/2019   Chronic pain of right hip 10/17/2019  Ischemic leg 05/29/2018   B12 deficiency 04/03/2018   PAD (peripheral artery disease) (HCC) 12/12/2017   Pain in limb 12/11/2017   GERD  (gastroesophageal reflux disease) 04/30/2017   BPH with obstruction/lower urinary tract symptoms 10/14/2014   Essential hypertension 06/26/2014   Type 2 diabetes mellitus with hyperglycemia (HCC) 06/26/2014   HLD (hyperlipidemia) 06/27/2006   Coronary atherosclerosis 06/27/2006   Tobacco abuse 06/27/2006     Past Medical History:  Diagnosis Date   Adenoma of left adrenal gland    Anemia    Aortic atherosclerosis (HCC)    Arthritis    Atrial fibrillation and flutter (HCC)    a.) CHA2DS2VASc = 7 (age x 2, HTN, CVA x 2, vascular disease history, T2DM);  b.) rate/rhythm maintained without pharmacological intervention; chronic antithrombotic therapy using ASA + clopidogrel   Bilateral carotid artery stenosis    a.) s/p PTA 04/07/2022 --> 90% RICA --> 9x7x30 Exact stent; b.) doppler 03/14/20214: 1-39% RICA, 40-59% LICA   BPH with urinary obstruction    stable on flomax ([name]   Childhood asthma    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   a.) CT head and MRI brain 04/05/2022: chronic cortical/subcortical RIGHT MCA territory infarct within the RIGHT parietal lobe and RIGHT temporoparietal junction   Coronary atherosclerosis of unspecified type of vessel, native or graft    a.) cCTA 05/22/2021: Ca score 1524 (84th percentile for age/sex match control) --> FFRct: dLAD = 0.77, dLCx = 0.67, RPLA = 0.77, dOM1 = 0.66; b.) LHC 05/23/2021: 20% dRCA, 50% OM1 - med mgmt   Diastolic dysfunction    a.) TTE 04/06/2022: EF 60-65%, mod LVH, mild MR, AoV sclerosis, G1DD   DOE (dyspnea on exertion)    Emphysema of lung (HCC)    Erectile dysfunction    a.) on PDE5i (sildenafil)   Esophageal stricture    Fall    Gastric AVM    Gastritis    GERD (gastroesophageal reflux disease)    Hiatal hernia    Hx of colonic polyps    Hyperlipidemia    Hypertension    Internal hemorrhoids without mention of complication    Ischemic leg    Long term current use of antithrombotics/antiplatelets    a.)  on DAPT (ASA + clopidogrel)   Lumbago    Mobitz (type) I (Wenckebach's) atrioventricular block    Non-small cell lung cancer (HCC)    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   a.) PCR (+) 05/29/2018, 04/07/2022   Pneumonia    PSA elevation    now averaging 2's   PVD (peripheral vascular disease) with claudication (HCC)    Sigmoid diverticulosis    Tobacco abuse    Type 2 diabetes mellitus treated with insulin [facility])      Past Surgical History:  Procedure Laterality Date   2D Echo  07/2001   BACK SURGERY     CAROTID PTA/STENT INTERVENTION Right 04/07/2022   Procedure: CAROTID PTA/STENT INTERVENTION;  Surgeon: Renford Dills, MD;  Location: [facility] INVASIVE CV LAB;  Service: Cardiovascular;  Laterality: Right;   COLONOSCOPY     ENDOBRONCHIAL ULTRASOUND Left 06/19/2022   Procedure: ENDOBRONCHIAL ULTRASOUND;  Surgeon: Salena Saner, MD;  Location: [facility] ORS;  Service: Pulmonary;  Laterality: Left;   ESOPHAGOGASTRODUODENOSCOPY  02/13/2007   gastritis and duodenitis without bleed   ESOPHAGOGASTRODUODENOSCOPY N/A 06/26/2014   Procedure: ESOPHAGOGASTRODUODENOSCOPY (EGD);  Surgeon: Rachael Fee, MD;  Location: [facility] ENDOSCOPY;  Service: Endoscopy;  Laterality: N/A;   ESOPHAGOGASTRODUODENOSCOPY (EGD) WITH PROPOFOL N/A  04/08/2020   Procedure: ESOPHAGOGASTRODUODENOSCOPY (EGD) WITH PROPOFOL;  Surgeon: Meryl Dare, MD;  Location: [facility] ENDOSCOPY;  Service: Endoscopy;  Laterality: N/A;   FLEXIBLE BRONCHOSCOPY Left 06/19/2022   Procedure: FLEXIBLE BRONCHOSCOPY;  Surgeon: Salena Saner, MD;  Location: [facility] ORS;  Service: Pulmonary;  Laterality: Left;   HOT HEMOSTASIS N/A 04/08/2020   Procedure: HOT HEMOSTASIS (ARGON PLASMA COAGULATION/BICAP);  Surgeon: Meryl Dare, MD;  Location: [facility] ENDOSCOPY;  Service: Endoscopy;  Laterality: N/A;   KNEE ARTHROSCOPY Right 11/1998   LEFT HEART CATH AND CORONARY ANGIOGRAPHY N/A 05/23/2021   Procedure: LEFT HEART CATH AND CORONARY ANGIOGRAPHY;  Surgeon:  Orbie Pyo, MD;  Location: [facility] INVASIVE CV LAB;  Service: Cardiovascular;  Laterality: N/A;   LOWER EXTREMITY ANGIOGRAPHY Right 12/27/2017   Procedure: LOWER EXTREMITY ANGIOGRAPHY;  Surgeon: Annice Needy, MD;  Location: [facility] INVASIVE CV LAB;  Service: Cardiovascular;  Laterality: Right;   LOWER EXTREMITY ANGIOGRAPHY Left 01/10/2018   Procedure: LOWER EXTREMITY ANGIOGRAPHY;  Surgeon: Annice Needy, MD;  Location: [facility] INVASIVE CV LAB;  Service: Cardiovascular;  Laterality: Left;   LOWER EXTREMITY ANGIOGRAPHY Left 05/29/2018   Procedure: LOWER EXTREMITY ANGIOGRAPHY;  Surgeon: Annice Needy, MD;  Location: [facility] INVASIVE CV LAB;  Service: Cardiovascular;  Laterality: Left;   LOWER EXTREMITY ANGIOGRAPHY Left 05/30/2018   Procedure: Lower Extremity Angiography;  Surgeon: Annice Needy, MD;  Location: [facility] INVASIVE CV LAB;  Service: Cardiovascular;  Laterality: Left;   LP  07/2001   Microwave thermotherapy prostate  08/28/2007   [place]   Persantine cardiolite  04/1999   EF 68%   POLYPECTOMY  12/1993   Stress myoview  08/2004   EF 67%   UPPER GASTROINTESTINAL ENDOSCOPY      Social History   Socioeconomic History   Marital status: Widowed    Spouse name: Armando Reichert   Number of children: 3   Years of education: Not on file   Highest education level: Not on file  Occupational History   Occupation: Retired Music therapist  Tobacco Use   Smoking status: Former    Packs/day: 2.00    Years: 57.00    Additional pack years: 0.00    Total pack years: 114.00    Types: Cigarettes    Start date: 02/07/1964    Quit date: 05/11/2021    Years since quitting: 1.1   Smokeless tobacco: Former    Types: Associate Professor Use: Never used  Substance and Sexual Activity   Alcohol use: Yes    Comment: rare   Drug use: No   Sexual activity: Yes  Other Topics Concern   Not on file  Social History Narrative   Married 9/09 wife with moderate memory problems.   3 adult children, 2 grandchildren   Would  desire CPR   Social Determinants of Health   Financial Resource Strain: Low Risk  (01/24/2022)   Overall Financial Resource Strain (CARDIA)    Difficulty of Paying Living Expenses: Not hard at all  Food Insecurity: No Food Insecurity (06/26/2022)   Hunger Vital Sign    Worried About Running Out of Food in the Last Year: Never true    Ran Out of Food in the Last Year: Never true  Transportation Needs: No Transportation Needs (06/26/2022)   PRAPARE - Administrator, Civil Service (Medical): No    Lack of Transportation (Non-Medical): No  Physical Activity: Insufficiently Active (01/24/2022)   Exercise Vital Sign    Days of Exercise  per Week: 3 days    Minutes of Exercise per Session: 30 min  Stress: No Stress Concern Present (01/24/2022)   [employer] of Occupational Health - Occupational Stress Questionnaire    Feeling of Stress : Not at all  Social Connections: Moderately Isolated (01/24/2022)   Social Connection and Isolation Panel [NHANES]    Frequency of Communication with Friends and Family: More than three times a week    Frequency of Social Gatherings with Friends and Family: Once a week    Attends Religious Services: More than 4 times per year    Active Member of [employer] or Organizations: No    Attends Banker Meetings: Never    Marital Status: Widowed  Intimate Partner Violence: Not At Risk (06/26/2022)   Humiliation, Afraid, Rape, and Kick questionnaire    Fear of Current or Ex-Partner: No    Emotionally Abused: No    Physically Abused: No    Sexually Abused: No     Family History  Problem Relation Age of Onset   Rheum arthritis Mother    Diabetes Father    Alcohol abuse Paternal Uncle    Alcohol abuse Paternal Uncle    Alcohol abuse Paternal Uncle    Heart disease Neg Hx    Stroke Neg Hx    Cancer Neg Hx    Drug abuse Neg Hx    Depression Neg Hx    Colon cancer Neg Hx    Rectal cancer Neg Hx    Stomach cancer Neg Hx    Kidney  cancer Neg Hx    Bladder Cancer Neg Hx    Prostate cancer Neg Hx      Current Outpatient Medications:    acetaminophen (TYLENOL) 500 MG tablet, Take 1,000 mg by mouth every 6 (six) hours. Rapid release, Disp: , Rfl:    aspirin 81 MG EC tablet, Take 81 mg by mouth in the morning., Disp: , Rfl:    atorvastatin (LIPITOR) 20 MG tablet, TAKE 1 TABLET BY MOUTH IN THE EVENING FOR CHOLESTEROL (Patient taking differently: Take 20 mg by mouth in the morning.), Disp: 90 tablet, Rfl: 1   clopidogrel (PLAVIX) 75 MG tablet, Take 1 tablet (75 mg total) by mouth daily., Disp: 90 tablet, Rfl: 3   ferrous gluconate (FERGON) 324 MG tablet, TAKE 1 TABLET BY MOUTH EVERY DAY WITH BREAKFAST (Patient taking differently: Take 324 mg by mouth daily with breakfast.), Disp: 30 tablet, Rfl: 0   FIBER PO, Take 1 capsule by mouth in the morning., Disp: , Rfl:    finasteride (PROSCAR) 5 MG tablet, Take 1 tablet (5 mg total) by mouth daily. (Patient taking differently: Take 5 mg by mouth every morning.), Disp: 90 tablet, Rfl: 3   fluticasone (FLONASE) 50 MCG/ACT nasal spray, SPRAY 2 SPRAYS INTO EACH NOSTRIL EVERY DAY (Patient taking differently: Place 2 sprays into both nostrils as needed.), Disp: 16 mL, Rfl: 5   Fluticasone-Umeclidin-Vilant (TRELEGY ELLIPTA) 100-62.5-25 MCG/ACT AEPB, Inhale 1 puff into the lungs daily., Disp: 28 each, Rfl: 11   Fluticasone-Umeclidin-Vilant (TRELEGY ELLIPTA) 100-62.5-25 MCG/ACT AEPB, Inhale 1 puff into the lungs daily. (Patient taking differently: Inhale 1 puff into the lungs every morning.), Disp: 14 each, Rfl: 0   glipiZIDE (GLUCOTROL) 10 MG tablet, Take 10 mg by mouth daily before breakfast., Disp: , Rfl:    GLUCOSAMINE HCL PO, Take 1 tablet by mouth daily., Disp: , Rfl:    hydrALAZINE (APRESOLINE) 50 MG tablet, Take 1 tablet (50 mg total) by mouth  3 (three) times daily as needed (If systolic BP greater than 150 mmHg.). If systolic BP greater than 150 mmHg., Disp: 90 tablet, Rfl: 3   Insulin  Glargine (BASAGLAR KWIKPEN) 100 UNIT/ML, Inject 30 Units into the skin every morning., Disp: , Rfl:    lisinopril (ZESTRIL) 20 MG tablet, Take 1 tablet (20 mg total) by mouth daily. (Patient taking differently: Take 20 mg by mouth daily as needed. Patient reports taking if systolic over 160), Disp: 90 tablet, Rfl: 1   metFORMIN (GLUCOPHAGE) 500 MG tablet, TAKE 2 TABLETS BY MOUTH TWICE A DAY WITH MEAL FOR DIABETES (Patient taking differently: Take 1,000 mg by mouth 2 (two) times daily. TAKE 2 TABLETS BY MOUTH TWICE A DAY WITH MEAL FOR DIABETES), Disp: 360 tablet, Rfl: 2   pantoprazole (PROTONIX) 40 MG tablet, TAKE 1 TABLET (40 MG TOTAL) BY MOUTH DAILY. FOR HEARTBURN (Patient taking differently: Take 40 mg by mouth every evening. For heartburn), Disp: 90 tablet, Rfl: 0   sildenafil (VIAGRA) 100 MG tablet, Take 1 tablet (100 mg total) by mouth daily as needed for erectile dysfunction., Disp: 30 tablet, Rfl: 0   tamsulosin (FLOMAX) 0.4 MG CAPS capsule, Take 1 capsule (0.4 mg total) by mouth 2 (two) times daily., Disp: 180 capsule, Rfl: 3   vitamin B-12 (CYANOCOBALAMIN) 1000 MCG tablet, Take 1,000 mcg by mouth in the morning., Disp: , Rfl:    Physical exam:  Vitals:   06/26/22 1332 06/26/22 1346  BP: (!) 190/85 (!) 142/68  Pulse: (!) 53 68  Resp: 18   Temp: 97.7 F (36.5 C)   TempSrc: Tympanic   SpO2: 98%   Weight: 183 lb 4.8 oz (83.1 kg)   Height: 5\' 7"  (1.702 m)    Physical Exam Cardiovascular:     Rate and Rhythm: Normal rate and regular rhythm.     Heart sounds: Normal heart sounds.  Pulmonary:     Effort: Pulmonary effort is normal.     Breath sounds: Normal breath sounds.  Abdominal:     General: Bowel sounds are normal.     Palpations: Abdomen is soft.  Skin:    General: Skin is warm and dry.  Neurological:     Mental Status: He is alert and oriented to person, place, and time.           Latest Ref Rng & Units 04/14/2022    9:33 AM  CMP  Glucose 70 - 99 mg/dL 409   BUN 6  - 23 mg/dL 15   Creatinine 8.11 - 1.50 mg/dL 9.14   Sodium 782 - 956 mEq/L 140   Potassium 3.5 - 5.1 mEq/L 4.4   Chloride 96 - 112 mEq/L 105   CO2 19 - 32 mEq/L 28   Calcium 8.4 - 10.5 mg/dL 9.0   Total Protein 6.0 - 8.3 g/dL 5.5   Total Bilirubin 0.2 - 1.2 mg/dL 0.2   Alkaline Phos 39 - 117 U/L 86   AST 0 - 37 U/L 21   ALT 0 - 53 U/L 23       Latest Ref Rng & Units 04/14/2022    9:33 AM  CBC  WBC 4.0 - 10.5 K/uL 8.7   Hemoglobin 13.0 - 17.0 g/dL 21.3   Hematocrit 08.6 - 52.0 % 37.9   Platelets 150.0 - 400.0 K/uL 281.0     No images are attached to the encounter.  DG Chest Port 1 View  Result Date: 06/19/2022 CLINICAL DATA:  Status post bronchoscopic biopsy EXAM:  PORTABLE CHEST 1 VIEW COMPARISON:  Previous studies including the CT chest done on 05/11/2022 FINDINGS: Transverse diameter of heart is increased. Patchy infiltrate is seen in left mid lung field suggesting atelectasis/pneumonia. There is interval increase in interstitial markings in right parahilar region and right lower lung field. There is poor inspiration. Lateral CP angles are clear. There is no pneumothorax. IMPRESSION: There is no pneumothorax. Patchy infiltrate in left mid lung field may suggest atelectasis/pneumonia. There is crowding of markings in right lung which may be due to poor inspiration or suggest interstitial pneumonia. Short-term follow-up chest radiographs with better inspiration may be contributed. Electronically Signed   By: Ernie Avena M.D.   On: 06/19/2022 15:17    Assessment and plan- Patient is a 77 y.o. male with history of stage I non-small cell lung cancer involving the left upper lobe a year ago now presenting with recurrent mediastinal disease  Patient has recurrent non-small cell lung cancer/adenocarcinoma which has been biopsy-proven in the left paratracheal lymph node.  This will be treated as a stage III non-small cell lung cancer with a curative intent with concurrent chemotherapy  along with radiation.  I recommend weekly CarboTaxol chemotherapy with carboplatin given at AUC 2 along with Taxol at 45 mg/m IV once a week for 7 weeks.  Discussed risks and benefits of chemotherapy including all but not limited to nausea, vomiting, low blood counts, risk of infections and hospitalization.  Risk of infusion reaction and peripheral neuropathy associated with chemotherapy.  Patient understands and agrees to proceed as planned.  He will plan for port placement and chemo teach.  He will start chemotherapy when he starts radiation.  Upon completion of concurrent chemoradiation we will obtain repeat scans and if scans show partial response/stable disease we will move forward with maintenance durvalumab at that time.   Thank you for this kind referral and the opportunity to participate in the care of this patient   Visit Diagnosis 1. Non-small cell lung cancer, unspecified laterality (HCC)     Dr. Owens Shark, MD, MPH [facility] at [facility] [phone] 06/30/2022

## 2022-06-30 NOTE — OR Nursing (Signed)
Post procedure Libra glucose monitoring 109. FSBS 97

## 2022-06-30 NOTE — Progress Notes (Signed)
START OFF PATHWAY REGIMEN - Non-Small Cell Lung   [id number]:Carboplatin AUC=2 IV D1 + Paclitaxel 45 mg/m2 IV D1 q7 Days + RT:   A cycle is every 7 days, concurrent with RT:     Paclitaxel      Carboplatin   **Always confirm dose/schedule in your pharmacy ordering system**  Patient Characteristics: Local Recurrence Therapeutic Status: Local Recurrence Intent of Therapy: Curative Intent, Discussed with Patient 

## 2022-07-01 ENCOUNTER — Other Ambulatory Visit: Payer: Self-pay

## 2022-07-04 ENCOUNTER — Encounter: Payer: Self-pay | Admitting: *Deleted

## 2022-07-04 ENCOUNTER — Inpatient Hospital Stay: Payer: Medicare Other

## 2022-07-04 ENCOUNTER — Ambulatory Visit: Admission: RE | Admit: 2022-07-04 | Payer: Medicare Other | Source: Ambulatory Visit

## 2022-07-04 DIAGNOSIS — Z87891 Personal history of nicotine dependence: Secondary | ICD-10-CM | POA: Diagnosis not present

## 2022-07-04 DIAGNOSIS — C3490 Malignant neoplasm of unspecified part of unspecified bronchus or lung: Secondary | ICD-10-CM

## 2022-07-04 DIAGNOSIS — C3412 Malignant neoplasm of upper lobe, left bronchus or lung: Secondary | ICD-10-CM | POA: Diagnosis not present

## 2022-07-04 MED ORDER — LIDOCAINE-PRILOCAINE 2.5-2.5 % EX CREA
TOPICAL_CREAM | CUTANEOUS | 3 refills | Status: DC
Start: 2022-07-04 — End: 2022-09-14

## 2022-07-04 NOTE — Progress Notes (Signed)
Spoke with patient over the phone to review upcoming appts to start chemo/radiation treatments. All questions answered during call. Pt stated that he picked up all his prescriptions except the emla cream which the pharmacy didn't received. Informed pt that will send prescription again electronically and to let me know if unable to pick up before his treatment next week. Nothing further needed at this time. Instructed pt to call with any questions or needs. Pt verbalized understanding.

## 2022-07-05 ENCOUNTER — Encounter: Payer: Self-pay | Admitting: Pulmonary Disease

## 2022-07-06 MED FILL — Dexamethasone Sodium Phosphate Inj 100 MG/10ML: INTRAMUSCULAR | Qty: 1 | Status: AC

## 2022-07-07 ENCOUNTER — Other Ambulatory Visit: Payer: Self-pay

## 2022-07-10 ENCOUNTER — Encounter: Payer: Self-pay | Admitting: *Deleted

## 2022-07-10 ENCOUNTER — Inpatient Hospital Stay: Payer: Medicare Other

## 2022-07-10 ENCOUNTER — Encounter: Payer: Self-pay | Admitting: Oncology

## 2022-07-10 ENCOUNTER — Inpatient Hospital Stay (HOSPITAL_BASED_OUTPATIENT_CLINIC_OR_DEPARTMENT_OTHER): Payer: Medicare Other | Admitting: Oncology

## 2022-07-10 VITALS — BP 180/67 | HR 52 | Resp 17

## 2022-07-10 VITALS — BP 142/82 | HR 62 | Temp 97.1°F | Resp 18 | Ht 67.0 in | Wt 181.6 lb

## 2022-07-10 DIAGNOSIS — Z79899 Other long term (current) drug therapy: Secondary | ICD-10-CM | POA: Insufficient documentation

## 2022-07-10 DIAGNOSIS — Z5111 Encounter for antineoplastic chemotherapy: Secondary | ICD-10-CM | POA: Insufficient documentation

## 2022-07-10 DIAGNOSIS — Z87891 Personal history of nicotine dependence: Secondary | ICD-10-CM | POA: Insufficient documentation

## 2022-07-10 DIAGNOSIS — C3412 Malignant neoplasm of upper lobe, left bronchus or lung: Secondary | ICD-10-CM | POA: Insufficient documentation

## 2022-07-10 DIAGNOSIS — I441 Atrioventricular block, second degree: Secondary | ICD-10-CM | POA: Insufficient documentation

## 2022-07-10 DIAGNOSIS — Z510 Encounter for antineoplastic radiation therapy: Secondary | ICD-10-CM | POA: Diagnosis not present

## 2022-07-10 DIAGNOSIS — I10 Essential (primary) hypertension: Secondary | ICD-10-CM | POA: Insufficient documentation

## 2022-07-10 DIAGNOSIS — I2510 Atherosclerotic heart disease of native coronary artery without angina pectoris: Secondary | ICD-10-CM | POA: Insufficient documentation

## 2022-07-10 DIAGNOSIS — C3490 Malignant neoplasm of unspecified part of unspecified bronchus or lung: Secondary | ICD-10-CM

## 2022-07-10 LAB — CMP (CANCER CENTER ONLY)
ALT: 12 U/L (ref 0–44)
AST: 16 U/L (ref 15–41)
Albumin: 3.5 g/dL (ref 3.5–5.0)
Alkaline Phosphatase: 56 U/L (ref 38–126)
Anion gap: 9 (ref 5–15)
BUN: 22 mg/dL (ref 8–23)
CO2: 23 mmol/L (ref 22–32)
Calcium: 8.7 mg/dL — ABNORMAL LOW (ref 8.9–10.3)
Chloride: 105 mmol/L (ref 98–111)
Creatinine: 0.97 mg/dL (ref 0.61–1.24)
GFR, Estimated: >60 mL/min (ref >60)
Glucose, Bld: 207 mg/dL — ABNORMAL HIGH (ref 70–99)
Potassium: 3.8 mmol/L (ref 3.5–5.1)
Sodium: 137 mmol/L (ref 135–145)
Total Bilirubin: 0.3 mg/dL (ref 0.3–1.2)
Total Protein: 6.7 g/dL (ref 6.5–8.1)

## 2022-07-10 LAB — CBC WITH DIFFERENTIAL (CANCER CENTER ONLY)
Abs Immature Granulocytes: 0.05 10*3/uL (ref 0.00–0.07)
Basophils Absolute: 0.1 10*3/uL (ref 0.0–0.1)
Basophils Relative: 1 %
Eosinophils Absolute: 0.2 10*3/uL (ref 0.0–0.5)
Eosinophils Relative: 3 %
HCT: 36.4 % — ABNORMAL LOW (ref 39.0–52.0)
Hemoglobin: 11.9 g/dL — ABNORMAL LOW (ref 13.0–17.0)
Immature Granulocytes: 1 %
Lymphocytes Relative: 12 %
Lymphs Abs: 1 10*3/uL (ref 0.7–4.0)
MCH: 28.5 pg (ref 26.0–34.0)
MCHC: 32.7 g/dL (ref 30.0–36.0)
MCV: 87.3 fL (ref 80.0–100.0)
Monocytes Absolute: 0.6 10*3/uL (ref 0.1–1.0)
Monocytes Relative: 7 %
Neutro Abs: 6.3 10*3/uL (ref 1.7–7.7)
Neutrophils Relative %: 76 %
Platelet Count: 279 10*3/uL (ref 150–400)
RBC: 4.17 MIL/uL — ABNORMAL LOW (ref 4.22–5.81)
RDW: 14.4 % (ref 11.5–15.5)
WBC Count: 8.2 10*3/uL (ref 4.0–10.5)
nRBC: 0 % (ref 0.0–0.2)

## 2022-07-10 MED ORDER — SODIUM CHLORIDE 0.9 % IV SOLN
45.0000 mg/m2 | Freq: Once | INTRAVENOUS | Status: AC
  Administered 2022-07-10: 90 mg via INTRAVENOUS
  Filled 2022-07-10: qty 15

## 2022-07-10 MED ORDER — SODIUM CHLORIDE 0.9 % IV SOLN
195.4000 mg | Freq: Once | INTRAVENOUS | Status: AC
  Administered 2022-07-10: 200 mg via INTRAVENOUS
  Filled 2022-07-10: qty 20

## 2022-07-10 MED ORDER — PALONOSETRON HCL INJECTION 0.25 MG/5ML
0.2500 mg | Freq: Once | INTRAVENOUS | Status: AC
  Administered 2022-07-10: 0.25 mg via INTRAVENOUS
  Filled 2022-07-10: qty 5

## 2022-07-10 MED ORDER — SODIUM CHLORIDE 0.9 % IV SOLN
10.0000 mg | Freq: Once | INTRAVENOUS | Status: AC
  Administered 2022-07-10: 10 mg via INTRAVENOUS
  Filled 2022-07-10: qty 10

## 2022-07-10 MED ORDER — SODIUM CHLORIDE 0.9 % IV SOLN
213.4000 mg | Freq: Once | INTRAVENOUS | Status: DC
Start: 2022-07-10 — End: 2022-07-10

## 2022-07-10 MED ORDER — FAMOTIDINE IN NACL 20-0.9 MG/50ML-% IV SOLN: 20.0000 mg | Freq: Once | INTRAVENOUS | Status: DC

## 2022-07-10 MED ORDER — FAMOTIDINE 20 MG IN NS 100 ML IVPB
20.0000 mg | Freq: Once | INTRAVENOUS | Status: AC
  Administered 2022-07-10: 20 mg via INTRAVENOUS
  Filled 2022-07-10: qty 100

## 2022-07-10 MED ORDER — HEPARIN SOD (PORK) LOCK FLUSH 100 UNIT/ML IV SOLN
500.0000 [IU] | Freq: Once | INTRAVENOUS | Status: AC | PRN
  Administered 2022-07-10: 500 [IU]
  Filled 2022-07-10: qty 5

## 2022-07-10 MED ORDER — SODIUM CHLORIDE 0.9 % IV SOLN
Freq: Once | INTRAVENOUS | Status: AC
  Filled 2022-07-10: qty 250

## 2022-07-10 MED ORDER — DIPHENHYDRAMINE HCL 50 MG/ML IJ SOLN
50.0000 mg | Freq: Once | INTRAMUSCULAR | Status: AC
  Administered 2022-07-10: 50 mg via INTRAVENOUS
  Filled 2022-07-10: qty 1

## 2022-07-10 NOTE — Progress Notes (Signed)
Hematology/Oncology Consult note [facility]  [phone] [phone]  Patient Care Team: Doreene Nest, NP as PCP - General (Internal Medicine) Debbe Odea, MD as PCP - Cardiology (Cardiology) Duke Salvia, MD as PCP - Electrophysiology (Cardiology) Hulan Fray as Physician Assistant (Urology) Meryl Dare, MD as Consulting Physician (Gastroenterology) Kathyrn Sheriff, [facility] as Pharmacist (Pharmacist) Glory Buff, RN as Oncology Nurse Navigator   Name of the patient: Antonio [name]  [id number]  01-17-1946   Date of visit: 07/10/22  Diagnosis-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  He has been undergoing surveillance CT scans since then.  CT chest in April 2024 showed increasing areas of disease with left-sided mediastinal and hilar lymph nodes and soft tissue along the left main bronchus.  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  No residual focal hypermetabolism in the treated area of the left upper lobe.  No evidence of distant metastatic disease.  He underwent MRI brain without contrast in February 2024 which did not show any evidence of metastatic disease.  Patient was seen by Dr. Jayme Cloud and underwent EBUS guided biopsies left paratracheal lymph node was biopsied and consistent with pulmonary adenocarcinoma.  He has been referred for further management.    Interval history-patient is frustrated because of the fact that he did not get his Emla cream on time from the pharmacy.  Denies other  complaints at this time  ECOG PS- 1 Pain scale- 0   Review of systems- Review of Systems  Constitutional:  Negative for chills, fever, malaise/fatigue and weight loss.  HENT:  Negative for congestion, ear discharge and nosebleeds.   Eyes:  Negative for blurred vision.  Respiratory:  Negative for cough, hemoptysis, sputum production, shortness of breath and wheezing.   Cardiovascular:  Negative for chest pain, palpitations, orthopnea and claudication.  Gastrointestinal:  Negative for abdominal pain, blood in stool, constipation, diarrhea, heartburn, melena, nausea and vomiting.  Genitourinary:  Negative for dysuria, flank pain, frequency, hematuria and urgency.  Musculoskeletal:  Negative for back pain, joint pain and myalgias.  Skin:  Negative for rash.  Neurological:  Negative for dizziness, tingling, focal weakness, seizures, weakness and headaches.  Endo/Heme/Allergies:  Does not bruise/bleed easily.  Psychiatric/Behavioral:  Negative for depression and suicidal ideas. The patient does not have insomnia.       No Known Allergies   Past Medical History:  Diagnosis Date   Adenoma of left adrenal gland    Anemia    Aortic atherosclerosis (HCC)    Arthritis    Atrial fibrillation and flutter (HCC)    a.) CHA2DS2VASc = 7 (age x 2, HTN, CVA x 2, vascular disease history, T2DM);  b.) rate/rhythm maintained without pharmacological intervention; chronic antithrombotic therapy using ASA + clopidogrel   Bilateral carotid artery stenosis    a.) s/p PTA 04/07/2022 --> 90% RICA --> 9x7x30 Exact stent; b.) doppler 03/14/20214: 1-39% RICA, 40-59% LICA   BPH with urinary obstruction    stable on flomax ([name]   Childhood asthma    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  a.) CT head and MRI brain 04/05/2022: chronic cortical/subcortical RIGHT MCA territory infarct within the RIGHT parietal lobe and RIGHT temporoparietal junction   Coronary atherosclerosis of unspecified type of  vessel, native or graft    a.) cCTA 05/22/2021: Ca score 1524 (84th percentile for age/sex match control) --> FFRct: dLAD = 0.77, dLCx = 0.67, RPLA = 0.77, dOM1 = 0.66; b.) LHC 05/23/2021: 20% dRCA, 50% OM1 - med mgmt   Diastolic dysfunction    a.) TTE 04/06/2022: EF 60-65%, mod LVH, mild MR, AoV sclerosis, G1DD   DOE (dyspnea on exertion)    Emphysema of lung (HCC)    Erectile dysfunction    a.) on PDE5i (sildenafil)   Esophageal stricture    Fall    Gastric AVM    Gastritis    GERD (gastroesophageal reflux disease)    Hiatal hernia    Hx of colonic polyps    Hyperlipidemia    Hypertension    Internal hemorrhoids without mention of complication    Ischemic leg    Long term current use of antithrombotics/antiplatelets    a.) on DAPT (ASA + clopidogrel)   Lumbago    Mobitz (type) I (Wenckebach's) atrioventricular block    Non-small cell lung cancer (HCC)    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   a.) PCR (+) 05/29/2018, 04/07/2022   Pneumonia    PSA elevation    now averaging 2's   PVD (peripheral vascular disease) with claudication (HCC)    Sigmoid diverticulosis    Tobacco abuse    Type 2 diabetes mellitus treated with insulin [facility])      Past Surgical History:  Procedure Laterality Date   2D Echo  07/2001   BACK SURGERY     CAROTID PTA/STENT INTERVENTION Right 04/07/2022   Procedure: CAROTID PTA/STENT INTERVENTION;  Surgeon: Renford Dills, MD;  Location: [facility] INVASIVE CV LAB;  Service: Cardiovascular;  Laterality: Right;   COLONOSCOPY     ENDOBRONCHIAL ULTRASOUND Left 06/19/2022   Procedure: ENDOBRONCHIAL ULTRASOUND;  Surgeon: Salena Saner, MD;  Location: [facility] ORS;  Service: Pulmonary;  Laterality: Left;   ESOPHAGOGASTRODUODENOSCOPY  02/13/2007   gastritis and duodenitis without bleed   ESOPHAGOGASTRODUODENOSCOPY N/A 06/26/2014   Procedure: ESOPHAGOGASTRODUODENOSCOPY (EGD);  Surgeon: Rachael Fee, MD;  Location: [facility] ENDOSCOPY;  Service: Endoscopy;   Laterality: N/A;   ESOPHAGOGASTRODUODENOSCOPY (EGD) WITH PROPOFOL N/A 04/08/2020   Procedure: ESOPHAGOGASTRODUODENOSCOPY (EGD) WITH PROPOFOL;  Surgeon: Meryl Dare, MD;  Location: [facility] ENDOSCOPY;  Service: Endoscopy;  Laterality: N/A;   FLEXIBLE BRONCHOSCOPY Left 06/19/2022   Procedure: FLEXIBLE BRONCHOSCOPY;  Surgeon: Salena Saner, MD;  Location: [facility] ORS;  Service: Pulmonary;  Laterality: Left;   HOT HEMOSTASIS N/A 04/08/2020   Procedure: HOT HEMOSTASIS (ARGON PLASMA COAGULATION/BICAP);  Surgeon: Meryl Dare, MD;  Location: [facility];  Service: Endoscopy;  Laterality: N/A;   IR IMAGING GUIDED PORT INSERTION  06/30/2022   KNEE ARTHROSCOPY Right 11/1998   LEFT HEART CATH AND CORONARY ANGIOGRAPHY N/A 05/23/2021   Procedure: LEFT HEART CATH AND CORONARY ANGIOGRAPHY;  Surgeon: Orbie Pyo, MD;  Location: [facility] INVASIVE CV LAB;  Service: Cardiovascular;  Laterality: N/A;   LOWER EXTREMITY ANGIOGRAPHY Right 12/27/2017   Procedure: LOWER EXTREMITY ANGIOGRAPHY;  Surgeon: Annice Needy, MD;  Location: [facility] INVASIVE CV LAB;  Service: Cardiovascular;  Laterality: Right;   LOWER EXTREMITY ANGIOGRAPHY Left 01/10/2018   Procedure: LOWER EXTREMITY ANGIOGRAPHY;  Surgeon: Annice Needy, MD;  Location: [facility]VASIVE CV LAB;  Service: Cardiovascular;  Laterality:  Left;   LOWER EXTREMITY ANGIOGRAPHY Left 05/29/2018   Procedure: LOWER EXTREMITY ANGIOGRAPHY;  Surgeon: Annice Needy, MD;  Location: [facility] INVASIVE CV LAB;  Service: Cardiovascular;  Laterality: Left;   LOWER EXTREMITY ANGIOGRAPHY Left 05/30/2018   Procedure: Lower Extremity Angiography;  Surgeon: Annice Needy, MD;  Location: [facility] INVASIVE CV LAB;  Service: Cardiovascular;  Laterality: Left;   LP  07/2001   Microwave thermotherapy prostate  08/28/2007   [place]   Persantine cardiolite  04/1999   EF 68%   POLYPECTOMY  12/1993   Stress myoview  08/2004   EF 67%   UPPER GASTROINTESTINAL ENDOSCOPY      Social History   Socioeconomic  History   Marital status: Widowed    Spouse name: Armando Reichert   Number of children: 3   Years of education: Not on file   Highest education level: Not on file  Occupational History   Occupation: Retired Music therapist  Tobacco Use   Smoking status: Former    Packs/day: 2.00    Years: 57.00    Additional pack years: 0.00    Total pack years: 114.00    Types: Cigarettes    Start date: 02/07/1964    Quit date: 05/11/2021    Years since quitting: 1.1   Smokeless tobacco: Former    Types: Chew   Tobacco comments:    Quit one year ago  Vaping Use   Vaping Use: Never used  Substance and Sexual Activity   Alcohol use: Yes    Comment: rare   Drug use: No   Sexual activity: Yes  Other Topics Concern   Not on file  Social History Narrative   Married 9/09 wife with moderate memory problems.   3 adult children, 2 grandchildren   Would desire CPR   Social Determinants of Health   Financial Resource Strain: Low Risk  (01/24/2022)   Overall Financial Resource Strain (CARDIA)    Difficulty of Paying Living Expenses: Not hard at all  Food Insecurity: No Food Insecurity (06/26/2022)   Hunger Vital Sign    Worried About Running Out of Food in the Last Year: Never true    Ran Out of Food in the Last Year: Never true  Transportation Needs: No Transportation Needs (06/26/2022)   PRAPARE - Administrator, Civil Service (Medical): No    Lack of Transportation (Non-Medical): No  Physical Activity: Insufficiently Active (01/24/2022)   Exercise Vital Sign    Days of Exercise per Week: 3 days    Minutes of Exercise per Session: 30 min  Stress: No Stress Concern Present (01/24/2022)   [employer] of Occupational Health - Occupational Stress Questionnaire    Feeling of Stress : Not at all  Social Connections: Moderately Isolated (01/24/2022)   Social Connection and Isolation Panel [NHANES]    Frequency of Communication with Friends and Family: More than three times a week    Frequency of  Social Gatherings with Friends and Family: Once a week    Attends Religious Services: More than 4 times per year    Active Member of [employer] or Organizations: No    Attends Banker Meetings: Never    Marital Status: Widowed  Intimate Partner Violence: Not At Risk (06/26/2022)   Humiliation, Afraid, Rape, and Kick questionnaire    Fear of Current or Ex-Partner: No    Emotionally Abused: No    Physically Abused: No    Sexually Abused: No    Family History  Problem Relation  Age of Onset   Rheum arthritis Mother    Diabetes Father    Alcohol abuse Paternal Uncle    Alcohol abuse Paternal Uncle    Alcohol abuse Paternal Uncle    Heart disease Neg Hx    Stroke Neg Hx    Cancer Neg Hx    Drug abuse Neg Hx    Depression Neg Hx    Colon cancer Neg Hx    Rectal cancer Neg Hx    Stomach cancer Neg Hx    Kidney cancer Neg Hx    Bladder Cancer Neg Hx    Prostate cancer Neg Hx      Current Outpatient Medications:    acetaminophen (TYLENOL) 500 MG tablet, Take 1,000 mg by mouth every 6 (six) hours. Rapid release, Disp: , Rfl:    aspirin 81 MG EC tablet, Take 81 mg by mouth in the morning., Disp: , Rfl:    atorvastatin (LIPITOR) 20 MG tablet, TAKE 1 TABLET BY MOUTH IN THE EVENING FOR CHOLESTEROL (Patient taking differently: Take 20 mg by mouth in the morning.), Disp: 90 tablet, Rfl: 1   clopidogrel (PLAVIX) 75 MG tablet, Take 1 tablet (75 mg total) by mouth daily., Disp: 90 tablet, Rfl: 3   dexamethasone (DECADRON) 4 MG tablet, Take 2 tablets daily for 2 days, start the day after chemotherapy. Take with food., Disp: 30 tablet, Rfl: 1   ferrous gluconate (FERGON) 324 MG tablet, TAKE 1 TABLET BY MOUTH EVERY DAY WITH BREAKFAST (Patient taking differently: Take 324 mg by mouth daily with breakfast.), Disp: 30 tablet, Rfl: 0   FIBER PO, Take 1 capsule by mouth in the morning., Disp: , Rfl:    finasteride (PROSCAR) 5 MG tablet, Take 1 tablet (5 mg total) by mouth daily. (Patient taking  differently: Take 5 mg by mouth every morning.), Disp: 90 tablet, Rfl: 3   fluticasone (FLONASE) 50 MCG/ACT nasal spray, SPRAY 2 SPRAYS INTO EACH NOSTRIL EVERY DAY (Patient taking differently: Place 2 sprays into both nostrils as needed.), Disp: 16 mL, Rfl: 5   Fluticasone-Umeclidin-Vilant (TRELEGY ELLIPTA) 100-62.5-25 MCG/ACT AEPB, Inhale 1 puff into the lungs daily., Disp: 28 each, Rfl: 11   Fluticasone-Umeclidin-Vilant (TRELEGY ELLIPTA) 100-62.5-25 MCG/ACT AEPB, Inhale 1 puff into the lungs daily. (Patient taking differently: Inhale 1 puff into the lungs every morning.), Disp: 14 each, Rfl: 0   glipiZIDE (GLUCOTROL) 10 MG tablet, Take 10 mg by mouth daily before breakfast., Disp: , Rfl:    GLUCOSAMINE HCL PO, Take 1 tablet by mouth daily., Disp: , Rfl:    hydrALAZINE (APRESOLINE) 50 MG tablet, Take 1 tablet (50 mg total) by mouth 3 (three) times daily as needed (If systolic BP greater than 150 mmHg.). If systolic BP greater than 150 mmHg., Disp: 90 tablet, Rfl: 3   Insulin Glargine (BASAGLAR KWIKPEN) 100 UNIT/ML, Inject 30 Units into the skin every morning., Disp: , Rfl:    lidocaine-prilocaine (EMLA) cream, Apply to affected area once, Disp: 30 g, Rfl: 3   lisinopril (ZESTRIL) 20 MG tablet, Take 1 tablet (20 mg total) by mouth daily. (Patient taking differently: Take 20 mg by mouth daily as needed. Patient reports taking if systolic over 160), Disp: 90 tablet, Rfl: 1   metFORMIN (GLUCOPHAGE) 500 MG tablet, TAKE 2 TABLETS BY MOUTH TWICE A DAY WITH MEAL FOR DIABETES (Patient taking differently: Take 1,000 mg by mouth 2 (two) times daily. TAKE 2 TABLETS BY MOUTH TWICE A DAY WITH MEAL FOR DIABETES), Disp: 360 tablet, Rfl: 2  ondansetron (ZOFRAN) 8 MG tablet, Take 1 tablet (8 mg total) by mouth every 8 (eight) hours as needed for nausea or vomiting. Start on the third day after chemotherapy., Disp: 30 tablet, Rfl: 1   pantoprazole (PROTONIX) 40 MG tablet, TAKE 1 TABLET (40 MG TOTAL) BY MOUTH DAILY. FOR  HEARTBURN (Patient taking differently: Take 40 mg by mouth every evening. For heartburn), Disp: 90 tablet, Rfl: 0   prochlorperazine (COMPAZINE) 10 MG tablet, Take 1 tablet (10 mg total) by mouth every 6 (six) hours as needed for nausea or vomiting., Disp: 30 tablet, Rfl: 1   sildenafil (VIAGRA) 100 MG tablet, Take 1 tablet (100 mg total) by mouth daily as needed for erectile dysfunction., Disp: 30 tablet, Rfl: 0   tamsulosin (FLOMAX) 0.4 MG CAPS capsule, Take 1 capsule (0.4 mg total) by mouth 2 (two) times daily., Disp: 180 capsule, Rfl: 3   vitamin B-12 (CYANOCOBALAMIN) 1000 MCG tablet, Take 1,000 mcg by mouth in the morning., Disp: , Rfl:   Physical exam:  Vitals:   07/10/22 0848 07/10/22 0855  BP: (!) 183/83 (!) 142/82  Pulse: 63 62  Resp: 18   Temp: (!) 97.1 F (36.2 C)   TempSrc: Tympanic   SpO2: 99%   Weight: 181 lb 9.6 oz (82.4 kg)   Height: 5\' 7"  (1.702 m)    Physical Exam Constitutional:      Comments: Ambulates with a cane.  Appears in no acute distress  Cardiovascular:     Rate and Rhythm: Normal rate and regular rhythm.     Heart sounds: Normal heart sounds.  Pulmonary:     Effort: Pulmonary effort is normal.     Breath sounds: Normal breath sounds.  Skin:    General: Skin is warm and dry.  Neurological:     Mental Status: He is alert and oriented to person, place, and time.         Latest Ref Rng & Units 04/14/2022    9:33 AM  CMP  Glucose 70 - 99 mg/dL 161   BUN 6 - 23 mg/dL 15   Creatinine 0.96 - 1.50 mg/dL 0.45   Sodium 409 - 811 mEq/L 140   Potassium 3.5 - 5.1 mEq/L 4.4   Chloride 96 - 112 mEq/L 105   CO2 19 - 32 mEq/L 28   Calcium 8.4 - 10.5 mg/dL 9.0   Total Protein 6.0 - 8.3 g/dL 5.5   Total Bilirubin 0.2 - 1.2 mg/dL 0.2   Alkaline Phos 39 - 117 U/L 86   AST 0 - 37 U/L 21   ALT 0 - 53 U/L 23       Latest Ref Rng & Units 04/14/2022    9:33 AM  CBC  WBC 4.0 - 10.5 K/uL 8.7   Hemoglobin 13.0 - 17.0 g/dL 91.4   Hematocrit 78.2 - 52.0 % 37.9    Platelets 150.0 - 400.0 K/uL 281.0     No images are attached to the encounter.  IR IMAGING GUIDED PORT INSERTION  Result Date: 06/30/2022 INDICATION: IV access for chemotherapy EXAM: Chest port placement using ultrasound and fluoroscopic guidance MEDICATIONS: Documented in the EMR ANESTHESIA/SEDATION: Moderate (conscious) sedation was employed during this procedure. A total of Versed 1.5 mg and Fentanyl 75 mcg was administered intravenously. Moderate Sedation Time: 31 minutes. The patient's level of consciousness and vital signs were monitored continuously by radiology nursing throughout the procedure under my direct supervision. FLUOROSCOPY TIME:  Fluoroscopy Time: 0.5 minutes (2 mGy) COMPLICATIONS: None immediate.  PROCEDURE: Informed written consent was obtained from the patient after a thorough discussion of the procedural risks, benefits and alternatives. All questions were addressed. Maximal Sterile Barrier Technique was utilized including caps, mask, sterile gowns, sterile gloves, sterile drape, hand hygiene and skin antiseptic. A timeout was performed prior to the initiation of the procedure. The patient was placed supine on the exam table. The right neck and chest was prepped and draped in the standard sterile fashion. A preliminary ultrasound of the right neck was performed and demonstrates a patent right internal jugular vein. A permanent ultrasound image was stored in the electronic medical record. The overlying skin was anesthetized with 1% Lidocaine. Using ultrasound guidance, access was obtained into the right internal jugular vein using a 21 gauge micropuncture set. A wire was advanced into the SVC, a short incision was made at the puncture site, and serial dilatation performed. Next, in an ipsilateral infraclavicular location, an incision was made at the site of the subcutaneous reservoir. Blunt dissection was used to open a pocket to contain the reservoir. A subcutaneous tunnel was then  created from the port site to the puncture site. A(n) 8 Fr single lumen catheter was advanced through the tunnel. The catheter was attached to the port and this was placed in the subcutaneous pocket. Under fluoroscopic guidance, a peel away sheath was placed, and the catheter was trimmed to the appropriate length and was advanced into the central veins. The catheter length is 24 cm. The tip of the catheter lies near the superior cavoatrial junction. The port flushes and aspirates appropriately. The port was flushed and locked with heparinized saline. The port pocket was closed in 2 layers using 3-0 and 4-0 Vicryl/absorbable suture. Dermabond was also applied to both incisions. The patient tolerated the procedure well and was transferred to recovery in stable condition. IMPRESSION: Successful placement of a right-sided chest port via the right internal jugular vein. The port is ready for immediate use. Electronically Signed   By: Olive Bass M.D.   On: 06/30/2022 15:29   DG Chest Port 1 View  Result Date: 06/19/2022 CLINICAL DATA:  Status post bronchoscopic biopsy EXAM: PORTABLE CHEST 1 VIEW COMPARISON:  Previous studies including the CT chest done on 05/11/2022 FINDINGS: Transverse diameter of heart is increased. Patchy infiltrate is seen in left mid lung field suggesting atelectasis/pneumonia. There is interval increase in interstitial markings in right parahilar region and right lower lung field. There is poor inspiration. Lateral CP angles are clear. There is no pneumothorax. IMPRESSION: There is no pneumothorax. Patchy infiltrate in left mid lung field may suggest atelectasis/pneumonia. There is crowding of markings in right lung which may be due to poor inspiration or suggest interstitial pneumonia. Short-term follow-up chest radiographs with better inspiration may be contributed. Electronically Signed   By: Ernie Avena M.D.   On: 06/19/2022 15:17     Assessment and plan- Patient is a 77 y.o.  male with history of stage I non-small cell lung cancer involving the left upper lobe a year ago now presenting with recurrent mediastinal disease   We are treating his recurrent lung cancer stage III disease with biopsy-proven mediastinal adenopathy.  Counts okay to proceed with cycle 1 weekly CarboTaxol chemotherapy today.  He starts radiation in 2 days.  He will directly proceed for cycle 2 next week and I will see him back in 2 weeks for cycle 3   Visit Diagnosis 1. Recurrent non-small cell lung cancer (HCC)   2. Encounter for antineoplastic chemotherapy  Dr. Owens Shark, MD, MPH [facility] [phone] 07/10/2022 9:01 AM

## 2022-07-10 NOTE — Patient Instructions (Signed)
[facility]  Discharge Instructions: Thank you for choosing [facility] to provide your oncology and hematology care.  If you have a lab appointment with the Cancer Center, please go directly to the Cancer Center and check in at the registration area.  Wear comfortable clothing and clothing appropriate for easy access to any Portacath or PICC line.   We strive to give you quality time with your provider. You may need to reschedule your appointment if you arrive late (15 or more minutes).  Arriving late affects you and other patients whose appointments are after yours.  Also, if you miss three or more appointments without notifying the office, you may be dismissed from the clinic at the provider's discretion.      For prescription refill requests, have your pharmacy contact our office and allow 72 hours for refills to be completed.    Today you received the following chemotherapy and/or immunotherapy agents Carboplatin and Taxol.      To help prevent nausea and vomiting after your treatment, we encourage you to take your nausea medication as directed.  BELOW ARE SYMPTOMS THAT SHOULD BE REPORTED IMMEDIATELY: *FEVER GREATER THAN 100.4 F (38 C) OR HIGHER *CHILLS OR SWEATING *NAUSEA AND VOMITING THAT IS NOT CONTROLLED WITH YOUR NAUSEA MEDICATION *UNUSUAL SHORTNESS OF BREATH *UNUSUAL BRUISING OR BLEEDING *URINARY PROBLEMS (pain or burning when urinating, or frequent urination) *BOWEL PROBLEMS (unusual diarrhea, constipation, pain near the anus) TENDERNESS IN MOUTH AND THROAT WITH OR WITHOUT PRESENCE OF ULCERS (sore throat, sores in mouth, or a toothache) UNUSUAL RASH, SWELLING OR PAIN  UNUSUAL VAGINAL DISCHARGE OR ITCHING   Items with * indicate a potential emergency and should be followed up as soon as possible or go to the Emergency Department if any problems should occur.  Please show the CHEMOTHERAPY ALERT CARD or IMMUNOTHERAPY ALERT CARD at  check-in to the Emergency Department and triage nurse.  Should you have questions after your visit or need to cancel or reschedule your appointment, please contact [facility]  [phone] and follow the prompts.  Office hours are 8:00 a.m. to 4:30 p.m. Monday - Friday. Please note that voicemails left after 4:00 p.m. may not be returned until the following business day.  We are closed weekends and major holidays. You have access to a nurse at all times for urgent questions. Please call the main number to the clinic [phone] and follow the prompts.  For any non-urgent questions, you may also contact your provider using MyChart. We now offer e-Visits for anyone 29 and older to request care online for non-urgent symptoms. For details visit [email or web address].   Also download the MyChart app! Go to the app store, search "MyChart", open the app, select , and log in with your MyChart username and password.

## 2022-07-10 NOTE — Progress Notes (Signed)
Pt received Taxol for the firs time today. During tx SBP became elevated to 190-200, Dr Smith Robert notified and pt given brief pauses in tx during increases. Tx paused for 15 min between increased BP. Pt completed tx and tolerated well and remained asymptomatic.

## 2022-07-11 ENCOUNTER — Ambulatory Visit
Admission: RE | Admit: 2022-07-11 | Discharge: 2022-07-11 | Disposition: A | Discharge status: Home / Self Care | Payer: Medicare Other | Source: Ambulatory Visit | Attending: Radiation Oncology | Admitting: Radiation Oncology

## 2022-07-11 ENCOUNTER — Telehealth: Payer: Self-pay

## 2022-07-11 NOTE — Telephone Encounter (Signed)
Telephone call to patient for follow up after receiving first infusion.   Patient states infusion went great.  States eating good and drinking plenty of fluids.   Denies any nausea or vomiting.  Encouraged patient to call for any concerns or questions. 

## 2022-07-12 ENCOUNTER — Ambulatory Visit
Admission: RE | Admit: 2022-07-12 | Discharge: 2022-07-12 | Disposition: A | Discharge status: Home / Self Care | Payer: Medicare Other | Source: Ambulatory Visit | Attending: Radiation Oncology | Admitting: Radiation Oncology

## 2022-07-12 ENCOUNTER — Other Ambulatory Visit: Payer: Self-pay

## 2022-07-12 ENCOUNTER — Inpatient Hospital Stay (HOSPITAL_BASED_OUTPATIENT_CLINIC_OR_DEPARTMENT_OTHER): Payer: Medicare Other | Admitting: Hospice and Palliative Medicine

## 2022-07-12 ENCOUNTER — Ambulatory Visit: Payer: Medicare Other

## 2022-07-12 DIAGNOSIS — Z5111 Encounter for antineoplastic chemotherapy: Secondary | ICD-10-CM | POA: Diagnosis not present

## 2022-07-12 DIAGNOSIS — C3412 Malignant neoplasm of upper lobe, left bronchus or lung: Secondary | ICD-10-CM | POA: Diagnosis not present

## 2022-07-12 DIAGNOSIS — I441 Atrioventricular block, second degree: Secondary | ICD-10-CM | POA: Diagnosis not present

## 2022-07-12 DIAGNOSIS — I2510 Atherosclerotic heart disease of native coronary artery without angina pectoris: Secondary | ICD-10-CM | POA: Diagnosis not present

## 2022-07-12 DIAGNOSIS — Z510 Encounter for antineoplastic radiation therapy: Secondary | ICD-10-CM | POA: Diagnosis not present

## 2022-07-12 DIAGNOSIS — Z87891 Personal history of nicotine dependence: Secondary | ICD-10-CM | POA: Diagnosis not present

## 2022-07-12 DIAGNOSIS — C3490 Malignant neoplasm of unspecified part of unspecified bronchus or lung: Secondary | ICD-10-CM

## 2022-07-12 LAB — RAD ONC ARIA SESSION SUMMARY
Course Elapsed Days: 0
Plan Fractions Treated to Date: 1
Plan Prescribed Dose Per Fraction: 2 Gy
Plan Total Fractions Prescribed: 33
Plan Total Prescribed Dose: 66 Gy
Reference Point Dosage Given to Date: 2 Gy
Reference Point Session Dosage Given: 2 Gy
Session Number: 1

## 2022-07-12 NOTE — Progress Notes (Signed)
Multidisciplinary Oncology Council Documentation  Antonio [name] was presented by our [facility] on 07/12/2022, which included representatives from:  Palliative Care Dietitian  Physical/Occupational Therapist Nurse Navigator Genetics Speech Therapist Social work Survivorship RN Financial Navigator Research RN   Antonio [name] currently presents with history of lung cancer  We reviewed previous medical and familial history, history of present illness, and recent lab results along with all available histopathologic and imaging studies. The MOC considered available treatment options and made the following recommendations/referrals:  SW, nutrition  The MOC is a meeting of clinicians from various specialty areas who evaluate and discuss patients for whom a multidisciplinary approach is being considered. Final determinations in the plan of care are those of the provider(s).   Today's extended care, comprehensive team conference, Antonio [name] was not present for the discussion and was not examined.

## 2022-07-13 ENCOUNTER — Other Ambulatory Visit: Payer: Self-pay

## 2022-07-13 ENCOUNTER — Ambulatory Visit
Admission: RE | Admit: 2022-07-13 | Discharge: 2022-07-13 | Disposition: A | Discharge status: Home / Self Care | Payer: Medicare Other | Source: Ambulatory Visit | Attending: Radiation Oncology | Admitting: Radiation Oncology

## 2022-07-13 DIAGNOSIS — C3412 Malignant neoplasm of upper lobe, left bronchus or lung: Secondary | ICD-10-CM | POA: Diagnosis not present

## 2022-07-13 DIAGNOSIS — I441 Atrioventricular block, second degree: Secondary | ICD-10-CM | POA: Diagnosis not present

## 2022-07-13 DIAGNOSIS — Z87891 Personal history of nicotine dependence: Secondary | ICD-10-CM | POA: Diagnosis not present

## 2022-07-13 DIAGNOSIS — Z510 Encounter for antineoplastic radiation therapy: Secondary | ICD-10-CM | POA: Diagnosis not present

## 2022-07-13 DIAGNOSIS — Z5111 Encounter for antineoplastic chemotherapy: Secondary | ICD-10-CM | POA: Diagnosis not present

## 2022-07-13 DIAGNOSIS — I2510 Atherosclerotic heart disease of native coronary artery without angina pectoris: Secondary | ICD-10-CM | POA: Diagnosis not present

## 2022-07-13 LAB — RAD ONC ARIA SESSION SUMMARY
Course Elapsed Days: 1
Plan Fractions Treated to Date: 2
Plan Prescribed Dose Per Fraction: 2 Gy
Plan Total Fractions Prescribed: 33
Plan Total Prescribed Dose: 66 Gy
Reference Point Dosage Given to Date: 4 Gy
Reference Point Session Dosage Given: 2 Gy
Session Number: 2

## 2022-07-14 ENCOUNTER — Other Ambulatory Visit: Payer: Self-pay

## 2022-07-14 ENCOUNTER — Ambulatory Visit
Admission: RE | Admit: 2022-07-14 | Discharge: 2022-07-14 | Disposition: A | Discharge status: Home / Self Care | Payer: Medicare Other | Source: Ambulatory Visit | Attending: Radiation Oncology | Admitting: Radiation Oncology

## 2022-07-14 DIAGNOSIS — Z87891 Personal history of nicotine dependence: Secondary | ICD-10-CM | POA: Diagnosis not present

## 2022-07-14 DIAGNOSIS — I2510 Atherosclerotic heart disease of native coronary artery without angina pectoris: Secondary | ICD-10-CM | POA: Diagnosis not present

## 2022-07-14 DIAGNOSIS — C3412 Malignant neoplasm of upper lobe, left bronchus or lung: Secondary | ICD-10-CM | POA: Diagnosis not present

## 2022-07-14 DIAGNOSIS — Z510 Encounter for antineoplastic radiation therapy: Secondary | ICD-10-CM | POA: Diagnosis not present

## 2022-07-14 DIAGNOSIS — Z5111 Encounter for antineoplastic chemotherapy: Secondary | ICD-10-CM | POA: Diagnosis not present

## 2022-07-14 DIAGNOSIS — I441 Atrioventricular block, second degree: Secondary | ICD-10-CM | POA: Diagnosis not present

## 2022-07-14 LAB — RAD ONC ARIA SESSION SUMMARY
Course Elapsed Days: 2
Plan Fractions Treated to Date: 3
Plan Prescribed Dose Per Fraction: 2 Gy
Plan Total Fractions Prescribed: 33
Plan Total Prescribed Dose: 66 Gy
Reference Point Dosage Given to Date: 6 Gy
Reference Point Session Dosage Given: 2 Gy
Session Number: 3

## 2022-07-14 MED FILL — Dexamethasone Sodium Phosphate Inj 100 MG/10ML: INTRAMUSCULAR | Qty: 1 | Status: AC

## 2022-07-17 ENCOUNTER — Inpatient Hospital Stay: Payer: Medicare Other

## 2022-07-17 ENCOUNTER — Telehealth: Payer: Self-pay | Admitting: Primary Care

## 2022-07-17 ENCOUNTER — Ambulatory Visit
Admission: RE | Admit: 2022-07-17 | Discharge: 2022-07-17 | Disposition: A | Discharge status: Home / Self Care | Payer: Medicare Other | Source: Ambulatory Visit | Attending: Radiation Oncology | Admitting: Radiation Oncology

## 2022-07-17 ENCOUNTER — Encounter: Payer: Self-pay | Admitting: *Deleted

## 2022-07-17 ENCOUNTER — Other Ambulatory Visit: Payer: Self-pay

## 2022-07-17 VITALS — BP 178/81 | HR 62 | Temp 95.4°F | Wt 180.7 lb

## 2022-07-17 DIAGNOSIS — C3490 Malignant neoplasm of unspecified part of unspecified bronchus or lung: Secondary | ICD-10-CM

## 2022-07-17 DIAGNOSIS — Z87891 Personal history of nicotine dependence: Secondary | ICD-10-CM | POA: Diagnosis not present

## 2022-07-17 DIAGNOSIS — I441 Atrioventricular block, second degree: Secondary | ICD-10-CM | POA: Diagnosis not present

## 2022-07-17 DIAGNOSIS — Z510 Encounter for antineoplastic radiation therapy: Secondary | ICD-10-CM | POA: Diagnosis not present

## 2022-07-17 DIAGNOSIS — I2510 Atherosclerotic heart disease of native coronary artery without angina pectoris: Secondary | ICD-10-CM | POA: Diagnosis not present

## 2022-07-17 DIAGNOSIS — C3412 Malignant neoplasm of upper lobe, left bronchus or lung: Secondary | ICD-10-CM | POA: Diagnosis not present

## 2022-07-17 DIAGNOSIS — Z5111 Encounter for antineoplastic chemotherapy: Secondary | ICD-10-CM | POA: Diagnosis not present

## 2022-07-17 LAB — CMP (CANCER CENTER ONLY)
ALT: 11 U/L (ref 0–44)
AST: 14 U/L — ABNORMAL LOW (ref 15–41)
Albumin: 3.3 g/dL — ABNORMAL LOW (ref 3.5–5.0)
Alkaline Phosphatase: 55 U/L (ref 38–126)
Anion gap: 5 (ref 5–15)
BUN: 21 mg/dL (ref 8–23)
CO2: 23 mmol/L (ref 22–32)
Calcium: 8.5 mg/dL — ABNORMAL LOW (ref 8.9–10.3)
Chloride: 108 mmol/L (ref 98–111)
Creatinine: 0.95 mg/dL (ref 0.61–1.24)
GFR, Estimated: >60 mL/min (ref >60)
Glucose, Bld: 212 mg/dL — ABNORMAL HIGH (ref 70–99)
Potassium: 4 mmol/L (ref 3.5–5.1)
Sodium: 136 mmol/L (ref 135–145)
Total Bilirubin: 0.5 mg/dL (ref 0.3–1.2)
Total Protein: 6.1 g/dL — ABNORMAL LOW (ref 6.5–8.1)

## 2022-07-17 LAB — CBC WITH DIFFERENTIAL (CANCER CENTER ONLY)
Abs Immature Granulocytes: 0.05 10*3/uL (ref 0.00–0.07)
Basophils Absolute: 0 10*3/uL (ref 0.0–0.1)
Basophils Relative: 1 %
Eosinophils Absolute: 0.3 10*3/uL (ref 0.0–0.5)
Eosinophils Relative: 4 %
HCT: 34.6 % — ABNORMAL LOW (ref 39.0–52.0)
Hemoglobin: 11.4 g/dL — ABNORMAL LOW (ref 13.0–17.0)
Immature Granulocytes: 1 %
Lymphocytes Relative: 12 %
Lymphs Abs: 0.9 10*3/uL (ref 0.7–4.0)
MCH: 28.8 pg (ref 26.0–34.0)
MCHC: 32.9 g/dL (ref 30.0–36.0)
MCV: 87.4 fL (ref 80.0–100.0)
Monocytes Absolute: 0.4 10*3/uL (ref 0.1–1.0)
Monocytes Relative: 5 %
Neutro Abs: 5.5 10*3/uL (ref 1.7–7.7)
Neutrophils Relative %: 77 %
Platelet Count: 267 10*3/uL (ref 150–400)
RBC: 3.96 MIL/uL — ABNORMAL LOW (ref 4.22–5.81)
RDW: 14.3 % (ref 11.5–15.5)
WBC Count: 7.2 10*3/uL (ref 4.0–10.5)
nRBC: 0 % (ref 0.0–0.2)

## 2022-07-17 LAB — RAD ONC ARIA SESSION SUMMARY
Course Elapsed Days: 5
Plan Fractions Treated to Date: 4
Plan Prescribed Dose Per Fraction: 2 Gy
Plan Total Fractions Prescribed: 33
Plan Total Prescribed Dose: 66 Gy
Reference Point Dosage Given to Date: 8 Gy
Reference Point Session Dosage Given: 2 Gy
Session Number: 4

## 2022-07-17 MED ORDER — FAMOTIDINE IN NACL 20-0.9 MG/50ML-% IV SOLN: 20.0000 mg | Freq: Once | INTRAVENOUS | Status: DC

## 2022-07-17 MED ORDER — SODIUM CHLORIDE 0.9 % IV SOLN
10.0000 mg | Freq: Once | INTRAVENOUS | Status: AC
  Administered 2022-07-17: 10 mg via INTRAVENOUS
  Filled 2022-07-17: qty 10

## 2022-07-17 MED ORDER — HEPARIN SOD (PORK) LOCK FLUSH 100 UNIT/ML IV SOLN
500.0000 [IU] | Freq: Once | INTRAVENOUS | Status: AC | PRN
  Administered 2022-07-17: 500 [IU]
  Filled 2022-07-17: qty 5

## 2022-07-17 MED ORDER — FAMOTIDINE 20 MG IN NS 100 ML IVPB
20.0000 mg | Freq: Once | INTRAVENOUS | Status: AC
  Administered 2022-07-17: 20 mg via INTRAVENOUS
  Filled 2022-07-17: qty 100

## 2022-07-17 MED ORDER — SODIUM CHLORIDE 0.9 % IV SOLN
195.4000 mg | Freq: Once | INTRAVENOUS | Status: AC
  Administered 2022-07-17: 200 mg via INTRAVENOUS
  Filled 2022-07-17: qty 20

## 2022-07-17 MED ORDER — SODIUM CHLORIDE 0.9 % IV SOLN
Freq: Once | INTRAVENOUS | Status: AC
  Filled 2022-07-17: qty 250

## 2022-07-17 MED ORDER — SODIUM CHLORIDE 0.9 % IV SOLN
45.0000 mg/m2 | Freq: Once | INTRAVENOUS | Status: AC
  Administered 2022-07-17: 90 mg via INTRAVENOUS
  Filled 2022-07-17: qty 15

## 2022-07-17 MED ORDER — DIPHENHYDRAMINE HCL 50 MG/ML IJ SOLN
50.0000 mg | Freq: Once | INTRAMUSCULAR | Status: AC
  Administered 2022-07-17: 50 mg via INTRAVENOUS
  Filled 2022-07-17: qty 1

## 2022-07-17 MED ORDER — PALONOSETRON HCL INJECTION 0.25 MG/5ML
0.2500 mg | Freq: Once | INTRAVENOUS | Status: AC
  Administered 2022-07-17: 0.25 mg via INTRAVENOUS
  Filled 2022-07-17: qty 5

## 2022-07-17 NOTE — Progress Notes (Signed)
Per Dr. Smith Robert, ok to run Taxol at prescribed flat rate while monitoring blood pressures. Elevated bps noted during first treatment with pauses in infusion to accommodate.

## 2022-07-17 NOTE — Patient Instructions (Signed)
[facility] [facility]  Discharge Instructions: Thank you for choosing [facility] to provide your oncology and hematology care.  If you have a lab appointment with the Cancer Center, please go directly to the Cancer Center and check in at the registration area.  Wear comfortable clothing and clothing appropriate for easy access to any Portacath or PICC line.   We strive to give you quality time with your provider. You may need to reschedule your appointment if you arrive late (15 or more minutes).  Arriving late affects you and other patients whose appointments are after yours.  Also, if you miss three or more appointments without notifying the office, you may be dismissed from the clinic at the provider's discretion.      For prescription refill requests, have your pharmacy contact our office and allow 72 hours for refills to be completed.    Today you received the following chemotherapy and/or immunotherapy agents Taxol & Paraplatin      To help prevent nausea and vomiting after your treatment, we encourage you to take your nausea medication as directed.  BELOW ARE SYMPTOMS THAT SHOULD BE REPORTED IMMEDIATELY: *FEVER GREATER THAN 100.4 F (38 C) OR HIGHER *CHILLS OR SWEATING *NAUSEA AND VOMITING THAT IS NOT CONTROLLED WITH YOUR NAUSEA MEDICATION *UNUSUAL SHORTNESS OF BREATH *UNUSUAL BRUISING OR BLEEDING *URINARY PROBLEMS (pain or burning when urinating, or frequent urination) *BOWEL PROBLEMS (unusual diarrhea, constipation, pain near the anus) TENDERNESS IN MOUTH AND THROAT WITH OR WITHOUT PRESENCE OF ULCERS (sore throat, sores in mouth, or a toothache) UNUSUAL RASH, SWELLING OR PAIN  UNUSUAL VAGINAL DISCHARGE OR ITCHING   Items with * indicate a potential emergency and should be followed up as soon as possible or go to the Emergency Department if any problems should occur.  Please show the CHEMOTHERAPY ALERT CARD or IMMUNOTHERAPY ALERT CARD at  check-in to the Emergency Department and triage nurse.  Should you have questions after your visit or need to cancel or reschedule your appointment, please contact [place] [facility]  [phone] and follow the prompts.  Office hours are 8:00 a.m. to 4:30 p.m. Monday - Friday. Please note that voicemails left after 4:00 p.m. may not be returned until the following business day.  We are closed weekends and major holidays. You have access to a nurse at all times for urgent questions. Please call the main number to the clinic [phone] and follow the prompts.  For any non-urgent questions, you may also contact your provider using MyChart. We now offer e-Visits for anyone 43 and older to request care online for non-urgent symptoms. For details visit [email or web address].   Also download the MyChart app! Go to the app store, search "MyChart", open the app, select [place], and log in with your MyChart username and password.

## 2022-07-17 NOTE — Telephone Encounter (Signed)
Patient dropped off document Handicap Placard, to be filled out by provider. Patient requested to send it via Call Patient to pick up within 7-days. Document is located in providers tray at front office.Please advise at Mobile [phone] (mobile)

## 2022-07-18 ENCOUNTER — Ambulatory Visit
Admission: RE | Admit: 2022-07-18 | Discharge: 2022-07-18 | Disposition: A | Discharge status: Home / Self Care | Payer: Medicare Other | Source: Ambulatory Visit | Attending: Radiation Oncology | Admitting: Radiation Oncology

## 2022-07-18 ENCOUNTER — Other Ambulatory Visit: Payer: Self-pay

## 2022-07-18 DIAGNOSIS — Z5111 Encounter for antineoplastic chemotherapy: Secondary | ICD-10-CM | POA: Diagnosis not present

## 2022-07-18 DIAGNOSIS — Z510 Encounter for antineoplastic radiation therapy: Secondary | ICD-10-CM | POA: Diagnosis not present

## 2022-07-18 DIAGNOSIS — Z87891 Personal history of nicotine dependence: Secondary | ICD-10-CM | POA: Diagnosis not present

## 2022-07-18 DIAGNOSIS — I2510 Atherosclerotic heart disease of native coronary artery without angina pectoris: Secondary | ICD-10-CM | POA: Diagnosis not present

## 2022-07-18 DIAGNOSIS — I441 Atrioventricular block, second degree: Secondary | ICD-10-CM | POA: Diagnosis not present

## 2022-07-18 DIAGNOSIS — C3412 Malignant neoplasm of upper lobe, left bronchus or lung: Secondary | ICD-10-CM | POA: Diagnosis not present

## 2022-07-18 LAB — RAD ONC ARIA SESSION SUMMARY
Course Elapsed Days: 6
Plan Fractions Treated to Date: 5
Plan Prescribed Dose Per Fraction: 2 Gy
Plan Total Fractions Prescribed: 33
Plan Total Prescribed Dose: 66 Gy
Reference Point Dosage Given to Date: 10 Gy
Reference Point Session Dosage Given: 2 Gy
Session Number: 5

## 2022-07-18 NOTE — Telephone Encounter (Signed)
Completed and placed in Kelli's inbox. 

## 2022-07-18 NOTE — Telephone Encounter (Signed)
Placed in [place] inbox fore review and completion.

## 2022-07-18 NOTE — Telephone Encounter (Signed)
Advised patient form is ready for pickup with reception.

## 2022-07-19 ENCOUNTER — Other Ambulatory Visit: Payer: Self-pay

## 2022-07-19 ENCOUNTER — Ambulatory Visit
Admission: RE | Admit: 2022-07-19 | Discharge: 2022-07-19 | Disposition: A | Discharge status: Home / Self Care | Payer: Medicare Other | Source: Ambulatory Visit | Attending: Radiation Oncology | Admitting: Radiation Oncology

## 2022-07-19 DIAGNOSIS — I2510 Atherosclerotic heart disease of native coronary artery without angina pectoris: Secondary | ICD-10-CM | POA: Diagnosis not present

## 2022-07-19 DIAGNOSIS — Z5111 Encounter for antineoplastic chemotherapy: Secondary | ICD-10-CM | POA: Diagnosis not present

## 2022-07-19 DIAGNOSIS — Z87891 Personal history of nicotine dependence: Secondary | ICD-10-CM | POA: Diagnosis not present

## 2022-07-19 DIAGNOSIS — I441 Atrioventricular block, second degree: Secondary | ICD-10-CM | POA: Diagnosis not present

## 2022-07-19 DIAGNOSIS — C3412 Malignant neoplasm of upper lobe, left bronchus or lung: Secondary | ICD-10-CM | POA: Diagnosis not present

## 2022-07-19 DIAGNOSIS — Z510 Encounter for antineoplastic radiation therapy: Secondary | ICD-10-CM | POA: Diagnosis not present

## 2022-07-19 LAB — RAD ONC ARIA SESSION SUMMARY
Course Elapsed Days: 7
Plan Fractions Treated to Date: 6
Plan Prescribed Dose Per Fraction: 2 Gy
Plan Total Fractions Prescribed: 33
Plan Total Prescribed Dose: 66 Gy
Reference Point Dosage Given to Date: 12 Gy
Reference Point Session Dosage Given: 2 Gy
Session Number: 6

## 2022-07-20 ENCOUNTER — Other Ambulatory Visit: Payer: Self-pay

## 2022-07-20 ENCOUNTER — Ambulatory Visit
Admission: RE | Admit: 2022-07-20 | Discharge: 2022-07-20 | Disposition: A | Discharge status: Home / Self Care | Payer: Medicare Other | Source: Ambulatory Visit | Attending: Radiation Oncology | Admitting: Radiation Oncology

## 2022-07-20 DIAGNOSIS — Z87891 Personal history of nicotine dependence: Secondary | ICD-10-CM | POA: Diagnosis not present

## 2022-07-20 DIAGNOSIS — C3412 Malignant neoplasm of upper lobe, left bronchus or lung: Secondary | ICD-10-CM | POA: Diagnosis not present

## 2022-07-20 DIAGNOSIS — I2510 Atherosclerotic heart disease of native coronary artery without angina pectoris: Secondary | ICD-10-CM | POA: Diagnosis not present

## 2022-07-20 DIAGNOSIS — I441 Atrioventricular block, second degree: Secondary | ICD-10-CM | POA: Diagnosis not present

## 2022-07-20 DIAGNOSIS — Z510 Encounter for antineoplastic radiation therapy: Secondary | ICD-10-CM | POA: Diagnosis not present

## 2022-07-20 DIAGNOSIS — Z5111 Encounter for antineoplastic chemotherapy: Secondary | ICD-10-CM | POA: Diagnosis not present

## 2022-07-20 LAB — RAD ONC ARIA SESSION SUMMARY
Course Elapsed Days: 8
Plan Fractions Treated to Date: 7
Plan Prescribed Dose Per Fraction: 2 Gy
Plan Total Fractions Prescribed: 33
Plan Total Prescribed Dose: 66 Gy
Reference Point Dosage Given to Date: 14 Gy
Reference Point Session Dosage Given: 2 Gy
Session Number: 7

## 2022-07-21 ENCOUNTER — Other Ambulatory Visit: Payer: Self-pay

## 2022-07-21 ENCOUNTER — Inpatient Hospital Stay: Payer: Medicare Other

## 2022-07-21 ENCOUNTER — Ambulatory Visit
Admission: RE | Admit: 2022-07-21 | Discharge: 2022-07-21 | Disposition: A | Discharge status: Home / Self Care | Payer: Medicare Other | Source: Ambulatory Visit | Attending: Radiation Oncology | Admitting: Radiation Oncology

## 2022-07-21 DIAGNOSIS — Z5111 Encounter for antineoplastic chemotherapy: Secondary | ICD-10-CM | POA: Diagnosis not present

## 2022-07-21 DIAGNOSIS — I441 Atrioventricular block, second degree: Secondary | ICD-10-CM | POA: Diagnosis not present

## 2022-07-21 DIAGNOSIS — I2510 Atherosclerotic heart disease of native coronary artery without angina pectoris: Secondary | ICD-10-CM | POA: Diagnosis not present

## 2022-07-21 DIAGNOSIS — C3412 Malignant neoplasm of upper lobe, left bronchus or lung: Secondary | ICD-10-CM | POA: Diagnosis not present

## 2022-07-21 DIAGNOSIS — Z510 Encounter for antineoplastic radiation therapy: Secondary | ICD-10-CM | POA: Diagnosis not present

## 2022-07-21 DIAGNOSIS — Z87891 Personal history of nicotine dependence: Secondary | ICD-10-CM | POA: Diagnosis not present

## 2022-07-21 LAB — RAD ONC ARIA SESSION SUMMARY
Course Elapsed Days: 9
Plan Fractions Treated to Date: 8
Plan Prescribed Dose Per Fraction: 2 Gy
Plan Total Fractions Prescribed: 33
Plan Total Prescribed Dose: 66 Gy
Reference Point Dosage Given to Date: 16 Gy
Reference Point Session Dosage Given: 2 Gy
Session Number: 8

## 2022-07-21 MED FILL — Dexamethasone Sodium Phosphate Inj 100 MG/10ML: INTRAMUSCULAR | Qty: 1 | Status: AC

## 2022-07-21 NOTE — Progress Notes (Signed)
Nutrition  Patient did not show up for scheduled nutrition visit.  Will send message to scheduling to reschedule.    [name] B. Freida Busman, RD, LDN Registered Dietitian [phone]

## 2022-07-24 ENCOUNTER — Other Ambulatory Visit: Payer: Self-pay

## 2022-07-24 ENCOUNTER — Encounter: Payer: Self-pay | Admitting: Oncology

## 2022-07-24 ENCOUNTER — Inpatient Hospital Stay (HOSPITAL_BASED_OUTPATIENT_CLINIC_OR_DEPARTMENT_OTHER): Payer: Medicare Other | Admitting: Oncology

## 2022-07-24 ENCOUNTER — Inpatient Hospital Stay: Payer: Medicare Other

## 2022-07-24 ENCOUNTER — Ambulatory Visit
Admission: RE | Admit: 2022-07-24 | Discharge: 2022-07-24 | Disposition: A | Discharge status: Home / Self Care | Payer: Medicare Other | Source: Ambulatory Visit | Attending: Radiation Oncology | Admitting: Radiation Oncology

## 2022-07-24 VITALS — BP 110/65 | HR 90 | Temp 96.5°F | Resp 18 | Ht 67.0 in | Wt 180.9 lb

## 2022-07-24 VITALS — BP 162/75 | HR 62

## 2022-07-24 DIAGNOSIS — C3490 Malignant neoplasm of unspecified part of unspecified bronchus or lung: Secondary | ICD-10-CM | POA: Diagnosis not present

## 2022-07-24 DIAGNOSIS — I441 Atrioventricular block, second degree: Secondary | ICD-10-CM | POA: Diagnosis not present

## 2022-07-24 DIAGNOSIS — Z87891 Personal history of nicotine dependence: Secondary | ICD-10-CM | POA: Diagnosis not present

## 2022-07-24 DIAGNOSIS — Z5111 Encounter for antineoplastic chemotherapy: Secondary | ICD-10-CM

## 2022-07-24 DIAGNOSIS — I2510 Atherosclerotic heart disease of native coronary artery without angina pectoris: Secondary | ICD-10-CM | POA: Diagnosis not present

## 2022-07-24 DIAGNOSIS — Z510 Encounter for antineoplastic radiation therapy: Secondary | ICD-10-CM | POA: Diagnosis not present

## 2022-07-24 DIAGNOSIS — C3412 Malignant neoplasm of upper lobe, left bronchus or lung: Secondary | ICD-10-CM | POA: Diagnosis not present

## 2022-07-24 LAB — CBC WITH DIFFERENTIAL (CANCER CENTER ONLY)
Abs Immature Granulocytes: 0.1 10*3/uL — ABNORMAL HIGH (ref 0.00–0.07)
Basophils Absolute: 0 10*3/uL (ref 0.0–0.1)
Basophils Relative: 1 %
Eosinophils Absolute: 0.2 10*3/uL (ref 0.0–0.5)
Eosinophils Relative: 5 %
HCT: 33.6 % — ABNORMAL LOW (ref 39.0–52.0)
Hemoglobin: 10.9 g/dL — ABNORMAL LOW (ref 13.0–17.0)
Immature Granulocytes: 2 %
Lymphocytes Relative: 14 %
Lymphs Abs: 0.7 10*3/uL (ref 0.7–4.0)
MCH: 28.7 pg (ref 26.0–34.0)
MCHC: 32.4 g/dL (ref 30.0–36.0)
MCV: 88.4 fL (ref 80.0–100.0)
Monocytes Absolute: 0.3 10*3/uL (ref 0.1–1.0)
Monocytes Relative: 7 %
Neutro Abs: 3.5 10*3/uL (ref 1.7–7.7)
Neutrophils Relative %: 71 %
Platelet Count: 250 10*3/uL (ref 150–400)
RBC: 3.8 MIL/uL — ABNORMAL LOW (ref 4.22–5.81)
RDW: 15 % (ref 11.5–15.5)
WBC Count: 4.8 10*3/uL (ref 4.0–10.5)
nRBC: 0 % (ref 0.0–0.2)

## 2022-07-24 LAB — CMP (CANCER CENTER ONLY)
ALT: 12 U/L (ref 0–44)
AST: 15 U/L (ref 15–41)
Albumin: 3.3 g/dL — ABNORMAL LOW (ref 3.5–5.0)
Alkaline Phosphatase: 53 U/L (ref 38–126)
Anion gap: 8 (ref 5–15)
BUN: 23 mg/dL (ref 8–23)
CO2: 22 mmol/L (ref 22–32)
Calcium: 8.5 mg/dL — ABNORMAL LOW (ref 8.9–10.3)
Chloride: 110 mmol/L (ref 98–111)
Creatinine: 0.97 mg/dL (ref 0.61–1.24)
GFR, Estimated: >60 mL/min (ref >60)
Glucose, Bld: 182 mg/dL — ABNORMAL HIGH (ref 70–99)
Potassium: 4.3 mmol/L (ref 3.5–5.1)
Sodium: 140 mmol/L (ref 135–145)
Total Bilirubin: 0.4 mg/dL (ref 0.3–1.2)
Total Protein: 5.8 g/dL — ABNORMAL LOW (ref 6.5–8.1)

## 2022-07-24 LAB — RAD ONC ARIA SESSION SUMMARY
Course Elapsed Days: 12
Plan Fractions Treated to Date: 9
Plan Prescribed Dose Per Fraction: 2 Gy
Plan Total Fractions Prescribed: 33
Plan Total Prescribed Dose: 66 Gy
Reference Point Dosage Given to Date: 18 Gy
Reference Point Session Dosage Given: 2 Gy
Session Number: 9

## 2022-07-24 MED ORDER — FAMOTIDINE IN NACL 20-0.9 MG/50ML-% IV SOLN
20.0000 mg | Freq: Once | INTRAVENOUS | Status: AC
  Administered 2022-07-24: 20 mg via INTRAVENOUS
  Filled 2022-07-24: qty 50

## 2022-07-24 MED ORDER — SODIUM CHLORIDE 0.9 % IV SOLN
10.0000 mg | Freq: Once | INTRAVENOUS | Status: AC
  Administered 2022-07-24: 10 mg via INTRAVENOUS
  Filled 2022-07-24: qty 10

## 2022-07-24 MED ORDER — HEPARIN SOD (PORK) LOCK FLUSH 100 UNIT/ML IV SOLN
500.0000 [IU] | Freq: Once | INTRAVENOUS | Status: AC | PRN
  Administered 2022-07-24: 500 [IU]
  Filled 2022-07-24: qty 5

## 2022-07-24 MED ORDER — SODIUM CHLORIDE 0.9 % IV SOLN
195.4000 mg | Freq: Once | INTRAVENOUS | Status: AC
  Administered 2022-07-24: 200 mg via INTRAVENOUS
  Filled 2022-07-24: qty 20

## 2022-07-24 MED ORDER — SODIUM CHLORIDE 0.9 % IV SOLN
Freq: Once | INTRAVENOUS | Status: AC
  Filled 2022-07-24: qty 250

## 2022-07-24 MED ORDER — SODIUM CHLORIDE 0.9 % IV SOLN
45.0000 mg/m2 | Freq: Once | INTRAVENOUS | Status: AC
  Administered 2022-07-24: 90 mg via INTRAVENOUS
  Filled 2022-07-24: qty 15

## 2022-07-24 MED ORDER — PALONOSETRON HCL INJECTION 0.25 MG/5ML
0.2500 mg | Freq: Once | INTRAVENOUS | Status: AC
  Administered 2022-07-24: 0.25 mg via INTRAVENOUS
  Filled 2022-07-24: qty 5

## 2022-07-24 MED ORDER — DIPHENHYDRAMINE HCL 50 MG/ML IJ SOLN
50.0000 mg | Freq: Once | INTRAMUSCULAR | Status: AC
  Administered 2022-07-24: 50 mg via INTRAVENOUS
  Filled 2022-07-24: qty 1

## 2022-07-24 NOTE — Progress Notes (Signed)
Hematology/Oncology Consult note [facility]  Telephone:([phone] [phone]  Patient Care Team: Doreene Nest, NP as PCP - General (Internal Medicine) Debbe Odea, MD as PCP - Cardiology (Cardiology) Duke Salvia, MD as PCP - Electrophysiology (Cardiology) Hulan Fray as Physician Assistant (Urology) Meryl Dare, MD as Consulting Physician (Gastroenterology) Kathyrn Sheriff, [facility] as Pharmacist (Pharmacist) Glory Buff, RN as Oncology Nurse Navigator   Name of the patient: Antonio [name]  [id number]  05/11/1945   Date of visit: 07/24/22  Diagnosis- 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 He has been undergoing surveillance CT scans since then. CT chest in April 2024 showed increasing areas of disease with left-sided mediastinal and hilar lymph nodes and soft tissue along the left main bronchus. 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 No residual focal hypermetabolism in the treated area of the left upper lobe. No evidence of distant metastatic disease. He underwent MRI brain without contrast in February 2024 which did not show any evidence of metastatic disease. Patient was seen by Dr. Jayme Cloud and underwent EBUS guided biopsies left paratracheal lymph node was biopsied and consistent with pulmonary adenocarcinoma.   Patient is receiving weekly concurrent CarboTaxol radiation  Interval history-tolerating chemotherapy well so far.  Reports some dysphagia but denies other complaints at this time  ECOG PS-  1 Pain scale- 0 Opioid associated constipation- no  Review of systems- Review of Systems  Constitutional:  Negative for chills, fever, malaise/fatigue and weight loss.  HENT:  Negative for congestion, ear discharge and nosebleeds.   Eyes:  Negative for blurred vision.  Respiratory:  Negative for cough, hemoptysis, sputum production, shortness of breath and wheezing.   Cardiovascular:  Negative for chest pain, palpitations, orthopnea and claudication.  Gastrointestinal:  Negative for abdominal pain, blood in stool, constipation, diarrhea, heartburn, melena, nausea and vomiting.  Genitourinary:  Negative for dysuria, flank pain, frequency, hematuria and urgency.  Musculoskeletal:  Negative for back pain, joint pain and myalgias.  Skin:  Negative for rash.  Neurological:  Negative for dizziness, tingling, focal weakness, seizures, weakness and headaches.  Endo/Heme/Allergies:  Does not bruise/bleed easily.  Psychiatric/Behavioral:  Negative for depression and suicidal ideas. The patient does not have insomnia.       No Known Allergies   Past Medical History:  Diagnosis Date   Adenoma of left adrenal gland    Anemia    Aortic atherosclerosis (HCC)    Arthritis    Atrial fibrillation and flutter (HCC)    a.) CHA2DS2VASc = 7 (age x 2, HTN, CVA x 2, vascular disease history, T2DM);  b.) rate/rhythm maintained without pharmacological intervention; chronic antithrombotic therapy using ASA + clopidogrel   Bilateral carotid artery stenosis    a.) s/p PTA 04/07/2022 --> 90% RICA --> 9x7x30 Exact stent; b.) doppler 03/14/20214: 1-39% RICA, 40-59% LICA   BPH with urinary obstruction    stable on flomax ([name]   Childhood asthma    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   a.) CT head and MRI brain 04/05/2022: chronic cortical/subcortical RIGHT MCA territory infarct within  the RIGHT parietal lobe and RIGHT temporoparietal junction   Coronary atherosclerosis of unspecified type of  vessel, native or graft    a.) cCTA 05/22/2021: Ca score 1524 (84th percentile for age/sex match control) --> FFRct: dLAD = 0.77, dLCx = 0.67, RPLA = 0.77, dOM1 = 0.66; b.) LHC 05/23/2021: 20% dRCA, 50% OM1 - med mgmt   Diastolic dysfunction    a.) TTE 04/06/2022: EF 60-65%, mod LVH, mild MR, AoV sclerosis, G1DD   DOE (dyspnea on exertion)    Emphysema of lung (HCC)    Erectile dysfunction    a.) on PDE5i (sildenafil)   Esophageal stricture    Fall    Gastric AVM    Gastritis    GERD (gastroesophageal reflux disease)    Hiatal hernia    Hx of colonic polyps    Hyperlipidemia    Hypertension    Internal hemorrhoids without mention of complication    Ischemic leg    Long term current use of antithrombotics/antiplatelets    a.) on DAPT (ASA + clopidogrel)   Lumbago    Mobitz (type) I (Wenckebach's) atrioventricular block    Non-small cell lung cancer (HCC)    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   a.) PCR (+) 05/29/2018, 04/07/2022   Pneumonia    PSA elevation    now averaging 2's   PVD (peripheral vascular disease) with claudication (HCC)    Sigmoid diverticulosis    Tobacco abuse    Type 2 diabetes mellitus treated with insulin [facility])      Past Surgical History:  Procedure Laterality Date   2D Echo  07/2001   BACK SURGERY     CAROTID PTA/STENT INTERVENTION Right 04/07/2022   Procedure: CAROTID PTA/STENT INTERVENTION;  Surgeon: Renford Dills, MD;  Location: [facility] INVASIVE CV LAB;  Service: Cardiovascular;  Laterality: Right;   COLONOSCOPY     ENDOBRONCHIAL ULTRASOUND Left 06/19/2022   Procedure: ENDOBRONCHIAL ULTRASOUND;  Surgeon: Salena Saner, MD;  Location: [facility] ORS;  Service: Pulmonary;  Laterality: Left;   ESOPHAGOGASTRODUODENOSCOPY  02/13/2007   gastritis and duodenitis without bleed   ESOPHAGOGASTRODUODENOSCOPY N/A 06/26/2014   Procedure: ESOPHAGOGASTRODUODENOSCOPY (EGD);  Surgeon: Rachael Fee, MD;  Location: [facility] ENDOSCOPY;  Service: Endoscopy;   Laterality: N/A;   ESOPHAGOGASTRODUODENOSCOPY (EGD) WITH PROPOFOL N/A 04/08/2020   Procedure: ESOPHAGOGASTRODUODENOSCOPY (EGD) WITH PROPOFOL;  Surgeon: Meryl Dare, MD;  Location: [facility] ENDOSCOPY;  Service: Endoscopy;  Laterality: N/A;   FLEXIBLE BRONCHOSCOPY Left 06/19/2022   Procedure: FLEXIBLE BRONCHOSCOPY;  Surgeon: Salena Saner, MD;  Location: [facility] ORS;  Service: Pulmonary;  Laterality: Left;   HOT HEMOSTASIS N/A 04/08/2020   Procedure: HOT HEMOSTASIS (ARGON PLASMA COAGULATION/BICAP);  Surgeon: Meryl Dare, MD;  Location: [facility] ENDOSCOPY;  Service: Endoscopy;  Laterality: N/A;   IR IMAGING GUIDED PORT INSERTION  06/30/2022   KNEE ARTHROSCOPY Right 11/1998   LEFT HEART CATH AND CORONARY ANGIOGRAPHY N/A 05/23/2021   Procedure: LEFT HEART CATH AND CORONARY ANGIOGRAPHY;  Surgeon: Orbie Pyo, MD;  Location: [facility] INVASIVE CV LAB;  Service: Cardiovascular;  Laterality: N/A;   LOWER EXTREMITY ANGIOGRAPHY Right 12/27/2017   Procedure: LOWER EXTREMITY ANGIOGRAPHY;  Surgeon: Annice Needy, MD;  Location: [facility] INVASIVE CV LAB;  Service: Cardiovascular;  Laterality: Right;   LOWER EXTREMITY ANGIOGRAPHY Left 01/10/2018   Procedure: LOWER EXTREMITY ANGIOGRAPHY;  Surgeon: Annice Needy, MD;  Location: [facility] INVASIVE CV LAB;  Service: Cardiovascular;  Laterality: Left;   LOWER EXTREMITY ANGIOGRAPHY Left 05/29/2018   Procedure: LOWER EXTREMITY ANGIOGRAPHY;  Surgeon: Annice Needy, MD;  Location: [facility] INVASIVE CV LAB;  Service: Cardiovascular;  Laterality: Left;   LOWER EXTREMITY ANGIOGRAPHY Left 05/30/2018   Procedure: Lower Extremity Angiography;  Surgeon: Annice Needy, MD;  Location: [facility] INVASIVE CV LAB;  Service: Cardiovascular;  Laterality: Left;   LP  07/2001   Microwave thermotherapy prostate  08/28/2007   [place]   Persantine cardiolite  04/1999   EF 68%   POLYPECTOMY  12/1993   Stress myoview  08/2004   EF 67%   UPPER GASTROINTESTINAL ENDOSCOPY      Social History   Socioeconomic  History   Marital status: Widowed    Spouse name: Armando Reichert   Number of children: 3   Years of education: Not on file   Highest education level: Not on file  Occupational History   Occupation: Retired Music therapist  Tobacco Use   Smoking status: Former    Packs/day: 2.00    Years: 57.00    Additional pack years: 0.00    Total pack years: 114.00    Types: Cigarettes    Start date: 02/07/1964    Quit date: 05/11/2021    Years since quitting: 1.2   Smokeless tobacco: Former    Types: Chew   Tobacco comments:    Quit one year ago  Vaping Use   Vaping Use: Never used  Substance and Sexual Activity   Alcohol use: Yes    Comment: rare   Drug use: No   Sexual activity: Yes  Other Topics Concern   Not on file  Social History Narrative   Married 9/09 wife with moderate memory problems.   3 adult children, 2 grandchildren   Would desire CPR   Social Determinants of Health   Financial Resource Strain: Low Risk  (01/24/2022)   Overall Financial Resource Strain (CARDIA)    Difficulty of Paying Living Expenses: Not hard at all  Food Insecurity: No Food Insecurity (06/26/2022)   Hunger Vital Sign    Worried About Running Out of Food in the Last Year: Never true    Ran Out of Food in the Last Year: Never true  Transportation Needs: No Transportation Needs (06/26/2022)   PRAPARE - Administrator, Civil Service (Medical): No    Lack of Transportation (Non-Medical): No  Physical Activity: Insufficiently Active (01/24/2022)   Exercise Vital Sign    Days of Exercise per Week: 3 days    Minutes of Exercise per Session: 30 min  Stress: No Stress Concern Present (01/24/2022)   [employer] of Occupational Health - Occupational Stress Questionnaire    Feeling of Stress : Not at all  Social Connections: Moderately Isolated (01/24/2022)   Social Connection and Isolation Panel [NHANES]    Frequency of Communication with Friends and Family: More than three times a week    Frequency of  Social Gatherings with Friends and Family: Once a week    Attends Religious Services: More than 4 times per year    Active Member of [employer] or Organizations: No    Attends Banker Meetings: Never    Marital Status: Widowed  Intimate Partner Violence: Not At Risk (06/26/2022)   Humiliation, Afraid, Rape, and Kick questionnaire    Fear of Current or Ex-Partner: No    Emotionally Abused: No    Physically Abused: No    Sexually Abused: No    Family History  Problem Relation Age of Onset   Rheum arthritis Mother    Diabetes Father  Alcohol abuse Paternal Uncle    Alcohol abuse Paternal Uncle    Alcohol abuse Paternal Uncle    Heart disease Neg Hx    Stroke Neg Hx    Cancer Neg Hx    Drug abuse Neg Hx    Depression Neg Hx    Colon cancer Neg Hx    Rectal cancer Neg Hx    Stomach cancer Neg Hx    Kidney cancer Neg Hx    Bladder Cancer Neg Hx    Prostate cancer Neg Hx      Current Outpatient Medications:    acetaminophen (TYLENOL) 500 MG tablet, Take 1,000 mg by mouth every 6 (six) hours. Rapid release, Disp: , Rfl:    aspirin 81 MG EC tablet, Take 81 mg by mouth in the morning., Disp: , Rfl:    atorvastatin (LIPITOR) 20 MG tablet, TAKE 1 TABLET BY MOUTH IN THE EVENING FOR CHOLESTEROL (Patient taking differently: Take 20 mg by mouth in the morning.), Disp: 90 tablet, Rfl: 1   clopidogrel (PLAVIX) 75 MG tablet, Take 1 tablet (75 mg total) by mouth daily., Disp: 90 tablet, Rfl: 3   dexamethasone (DECADRON) 4 MG tablet, Take 2 tablets daily for 2 days, start the day after chemotherapy. Take with food., Disp: 30 tablet, Rfl: 1   ferrous gluconate (FERGON) 324 MG tablet, TAKE 1 TABLET BY MOUTH EVERY DAY WITH BREAKFAST (Patient taking differently: Take 324 mg by mouth daily with breakfast.), Disp: 30 tablet, Rfl: 0   FIBER PO, Take 1 capsule by mouth in the morning., Disp: , Rfl:    finasteride (PROSCAR) 5 MG tablet, Take 1 tablet (5 mg total) by mouth daily. (Patient taking  differently: Take 5 mg by mouth every morning.), Disp: 90 tablet, Rfl: 3   fluticasone (FLONASE) 50 MCG/ACT nasal spray, SPRAY 2 SPRAYS INTO EACH NOSTRIL EVERY DAY (Patient taking differently: Place 2 sprays into both nostrils as needed.), Disp: 16 mL, Rfl: 5   Fluticasone-Umeclidin-Vilant (TRELEGY ELLIPTA) 100-62.5-25 MCG/ACT AEPB, Inhale 1 puff into the lungs daily., Disp: 28 each, Rfl: 11   Fluticasone-Umeclidin-Vilant (TRELEGY ELLIPTA) 100-62.5-25 MCG/ACT AEPB, Inhale 1 puff into the lungs daily. (Patient taking differently: Inhale 1 puff into the lungs every morning.), Disp: 14 each, Rfl: 0   glipiZIDE (GLUCOTROL) 10 MG tablet, Take 10 mg by mouth daily before breakfast., Disp: , Rfl:    GLUCOSAMINE HCL PO, Take 1 tablet by mouth daily., Disp: , Rfl:    hydrALAZINE (APRESOLINE) 50 MG tablet, Take 1 tablet (50 mg total) by mouth 3 (three) times daily as needed (If systolic BP greater than 150 mmHg.). If systolic BP greater than 150 mmHg., Disp: 90 tablet, Rfl: 3   Insulin Glargine (BASAGLAR KWIKPEN) 100 UNIT/ML, Inject 30 Units into the skin every morning., Disp: , Rfl:    lidocaine-prilocaine (EMLA) cream, Apply to affected area once, Disp: 30 g, Rfl: 3   lisinopril (ZESTRIL) 20 MG tablet, Take 1 tablet (20 mg total) by mouth daily. (Patient taking differently: Take 20 mg by mouth daily as needed. Patient reports taking if systolic over 160), Disp: 90 tablet, Rfl: 1   metFORMIN (GLUCOPHAGE) 500 MG tablet, TAKE 2 TABLETS BY MOUTH TWICE A DAY WITH MEAL FOR DIABETES (Patient taking differently: Take 1,000 mg by mouth 2 (two) times daily. TAKE 2 TABLETS BY MOUTH TWICE A DAY WITH MEAL FOR DIABETES), Disp: 360 tablet, Rfl: 2   ondansetron (ZOFRAN) 8 MG tablet, Take 1 tablet (8 mg total) by mouth every 8 (  eight) hours as needed for nausea or vomiting. Start on the third day after chemotherapy., Disp: 30 tablet, Rfl: 1   pantoprazole (PROTONIX) 40 MG tablet, TAKE 1 TABLET (40 MG TOTAL) BY MOUTH DAILY. FOR  HEARTBURN (Patient taking differently: Take 40 mg by mouth every evening. For heartburn), Disp: 90 tablet, Rfl: 0   prochlorperazine (COMPAZINE) 10 MG tablet, Take 1 tablet (10 mg total) by mouth every 6 (six) hours as needed for nausea or vomiting., Disp: 30 tablet, Rfl: 1   sildenafil (VIAGRA) 100 MG tablet, Take 1 tablet (100 mg total) by mouth daily as needed for erectile dysfunction., Disp: 30 tablet, Rfl: 0   tamsulosin (FLOMAX) 0.4 MG CAPS capsule, Take 1 capsule (0.4 mg total) by mouth 2 (two) times daily., Disp: 180 capsule, Rfl: 3   vitamin B-12 (CYANOCOBALAMIN) 1000 MCG tablet, Take 1,000 mcg by mouth in the morning., Disp: , Rfl:   Physical exam: There were no vitals filed for this visit. Physical Exam Cardiovascular:     Rate and Rhythm: Normal rate and regular rhythm.     Heart sounds: Normal heart sounds.  Pulmonary:     Effort: Pulmonary effort is normal.     Comments: Bibasilar crackles Abdominal:     General: Bowel sounds are normal.     Palpations: Abdomen is soft.  Skin:    General: Skin is warm and dry.  Neurological:     Mental Status: He is alert and oriented to person, place, and time.         Latest Ref Rng & Units 07/17/2022    8:53 AM  CMP  Glucose 70 - 99 mg/dL 409   BUN 8 - 23 mg/dL 21   Creatinine 8.11 - 1.24 mg/dL 9.14   Sodium 782 - 956 mmol/L 136   Potassium 3.5 - 5.1 mmol/L 4.0   Chloride 98 - 111 mmol/L 108   CO2 22 - 32 mmol/L 23   Calcium 8.9 - 10.3 mg/dL 8.5   Total Protein 6.5 - 8.1 g/dL 6.1   Total Bilirubin 0.3 - 1.2 mg/dL 0.5   Alkaline Phos 38 - 126 U/L 55   AST 15 - 41 U/L 14   ALT 0 - 44 U/L 11       Latest Ref Rng & Units 07/17/2022    8:53 AM  CBC  WBC 4.0 - 10.5 K/uL 7.2   Hemoglobin 13.0 - 17.0 g/dL 21.3   Hematocrit 08.6 - 52.0 % 34.6   Platelets 150 - 400 K/uL 267     No images are attached to the encounter.  IR IMAGING GUIDED PORT INSERTION  Result Date: 06/30/2022 INDICATION: IV access for chemotherapy EXAM:  Chest port placement using ultrasound and fluoroscopic guidance MEDICATIONS: Documented in the EMR ANESTHESIA/SEDATION: Moderate (conscious) sedation was employed during this procedure. A total of Versed 1.5 mg and Fentanyl 75 mcg was administered intravenously. Moderate Sedation Time: 31 minutes. The patient's level of consciousness and vital signs were monitored continuously by radiology nursing throughout the procedure under my direct supervision. FLUOROSCOPY TIME:  Fluoroscopy Time: 0.5 minutes (2 mGy) COMPLICATIONS: None immediate. PROCEDURE: Informed written consent was obtained from the patient after a thorough discussion of the procedural risks, benefits and alternatives. All questions were addressed. Maximal Sterile Barrier Technique was utilized including caps, mask, sterile gowns, sterile gloves, sterile drape, hand hygiene and skin antiseptic. A timeout was performed prior to the initiation of the procedure. The patient was placed supine on the exam table. The  right neck and chest was prepped and draped in the standard sterile fashion. A preliminary ultrasound of the right neck was performed and demonstrates a patent right internal jugular vein. A permanent ultrasound image was stored in the electronic medical record. The overlying skin was anesthetized with 1% Lidocaine. Using ultrasound guidance, access was obtained into the right internal jugular vein using a 21 gauge micropuncture set. A wire was advanced into the SVC, a short incision was made at the puncture site, and serial dilatation performed. Next, in an ipsilateral infraclavicular location, an incision was made at the site of the subcutaneous reservoir. Blunt dissection was used to open a pocket to contain the reservoir. A subcutaneous tunnel was then created from the port site to the puncture site. A(n) 8 Fr single lumen catheter was advanced through the tunnel. The catheter was attached to the port and this was placed in the subcutaneous  pocket. Under fluoroscopic guidance, a peel away sheath was placed, and the catheter was trimmed to the appropriate length and was advanced into the central veins. The catheter length is 24 cm. The tip of the catheter lies near the superior cavoatrial junction. The port flushes and aspirates appropriately. The port was flushed and locked with heparinized saline. The port pocket was closed in 2 layers using 3-0 and 4-0 Vicryl/absorbable suture. Dermabond was also applied to both incisions. The patient tolerated the procedure well and was transferred to recovery in stable condition. IMPRESSION: Successful placement of a right-sided chest port via the right internal jugular vein. The port is ready for immediate use. Electronically Signed   By: Olive Bass M.D.   On: 06/30/2022 15:29     Assessment and plan- Patient is a 77 y.o. male with history of stage I non-small cell lung cancer involving the left upper lobe in 2023 now with biopsy-proven mediastinal recurrence.  He is here for on treatment assessment prior to cycle 3 of weekly CarboTaxol chemotherapy  Counts okay to proceed with cycle 3 of weekly CarboTaxol chemotherapy today.  He will directly proceed for cycle 4 next week and will be seen by covering provider in 2 weeks for cycle 5.  I will see him back for cycle 7.   Visit Diagnosis 1. Encounter for antineoplastic chemotherapy   2. Recurrent non-small cell lung cancer (HCC)      Dr. Owens Shark, MD, MPH [facility] [phone] 07/24/2022 8:23 AM

## 2022-07-24 NOTE — Patient Instructions (Signed)
[facility] [place] [facility]  Discharge Instructions: Thank you for choosing [facility] to provide your oncology and hematology care.  If you have a lab appointment with the Cancer Center, please go directly to the Cancer Center and check in at the registration area.  Wear comfortable clothing and clothing appropriate for easy access to any Portacath or PICC line.   We strive to give you quality time with your provider. You may need to reschedule your appointment if you arrive late (15 or more minutes).  Arriving late affects you and other patients whose appointments are after yours.  Also, if you miss three or more appointments without notifying the office, you may be dismissed from the clinic at the provider's discretion.      For prescription refill requests, have your pharmacy contact our office and allow 72 hours for refills to be completed.    Today you received the following chemotherapy and/or immunotherapy agents Taxol and Carboplatin        To help prevent nausea and vomiting after your treatment, we encourage you to take your nausea medication as directed.  BELOW ARE SYMPTOMS THAT SHOULD BE REPORTED IMMEDIATELY: *FEVER GREATER THAN 100.4 F (38 C) OR HIGHER *CHILLS OR SWEATING *NAUSEA AND VOMITING THAT IS NOT CONTROLLED WITH YOUR NAUSEA MEDICATION *UNUSUAL SHORTNESS OF BREATH *UNUSUAL BRUISING OR BLEEDING *URINARY PROBLEMS (pain or burning when urinating, or frequent urination) *BOWEL PROBLEMS (unusual diarrhea, constipation, pain near the anus) TENDERNESS IN MOUTH AND THROAT WITH OR WITHOUT PRESENCE OF ULCERS (sore throat, sores in mouth, or a toothache) UNUSUAL RASH, SWELLING OR PAIN  UNUSUAL VAGINAL DISCHARGE OR ITCHING   Items with * indicate a potential emergency and should be followed up as soon as possible or go to the Emergency Department if any problems should occur.  Please show the CHEMOTHERAPY ALERT CARD or IMMUNOTHERAPY ALERT CARD  at check-in to the Emergency Department and triage nurse.  Should you have questions after your visit or need to cancel or reschedule your appointment, please contact [facility] [place] [facility]  [phone] and follow the prompts.  Office hours are 8:00 a.m. to 4:30 p.m. Monday - Friday. Please note that voicemails left after 4:00 p.m. may not be returned until the following business day.  We are closed weekends and major holidays. You have access to a nurse at all times for urgent questions. Please call the main number to the clinic [phone] and follow the prompts.  For any non-urgent questions, you may also contact your provider using MyChart. We now offer e-Visits for anyone 18 and older to request care online for non-urgent symptoms. For details visit [email or web address].   Also download the MyChart app! Go to the app store, search "MyChart", open the app, select [facility], and log in with your MyChart username and password.    

## 2022-07-25 ENCOUNTER — Other Ambulatory Visit: Payer: Self-pay

## 2022-07-25 ENCOUNTER — Ambulatory Visit
Admission: RE | Admit: 2022-07-25 | Discharge: 2022-07-25 | Disposition: A | Discharge status: Home / Self Care | Payer: Medicare Other | Source: Ambulatory Visit | Attending: Radiation Oncology | Admitting: Radiation Oncology

## 2022-07-25 ENCOUNTER — Inpatient Hospital Stay: Payer: Medicare Other | Admitting: Licensed Clinical Social Worker

## 2022-07-25 DIAGNOSIS — Z510 Encounter for antineoplastic radiation therapy: Secondary | ICD-10-CM | POA: Diagnosis not present

## 2022-07-25 DIAGNOSIS — I2510 Atherosclerotic heart disease of native coronary artery without angina pectoris: Secondary | ICD-10-CM | POA: Diagnosis not present

## 2022-07-25 DIAGNOSIS — C3412 Malignant neoplasm of upper lobe, left bronchus or lung: Secondary | ICD-10-CM | POA: Diagnosis not present

## 2022-07-25 DIAGNOSIS — Z87891 Personal history of nicotine dependence: Secondary | ICD-10-CM | POA: Diagnosis not present

## 2022-07-25 DIAGNOSIS — Z5111 Encounter for antineoplastic chemotherapy: Secondary | ICD-10-CM | POA: Diagnosis not present

## 2022-07-25 DIAGNOSIS — I441 Atrioventricular block, second degree: Secondary | ICD-10-CM | POA: Diagnosis not present

## 2022-07-25 LAB — RAD ONC ARIA SESSION SUMMARY
Course Elapsed Days: 13
Plan Fractions Treated to Date: 10
Plan Prescribed Dose Per Fraction: 2 Gy
Plan Total Fractions Prescribed: 33
Plan Total Prescribed Dose: 66 Gy
Reference Point Dosage Given to Date: 20 Gy
Reference Point Session Dosage Given: 2 Gy
Session Number: 10

## 2022-07-25 NOTE — Progress Notes (Signed)
[facility] Clinical Social Work  Clinical Social Work was referred by medical provider for assessment of psychosocial needs.  Clinical Social Worker contacted patient by phone to offer support and assess for needs.    Provided patient with information regarding CSW role and services that are available.  Patient stated he had no needs at this time, but would contact CSW in the future if needed.     Dorothey Baseman, LCSW  Clinical Social Worker [facility]

## 2022-07-26 ENCOUNTER — Ambulatory Visit
Admission: RE | Admit: 2022-07-26 | Discharge: 2022-07-26 | Disposition: A | Discharge status: Home / Self Care | Payer: Medicare Other | Source: Ambulatory Visit | Attending: Radiation Oncology | Admitting: Radiation Oncology

## 2022-07-26 ENCOUNTER — Other Ambulatory Visit: Payer: Self-pay

## 2022-07-26 DIAGNOSIS — Z510 Encounter for antineoplastic radiation therapy: Secondary | ICD-10-CM | POA: Diagnosis not present

## 2022-07-26 DIAGNOSIS — C3412 Malignant neoplasm of upper lobe, left bronchus or lung: Secondary | ICD-10-CM | POA: Diagnosis not present

## 2022-07-26 DIAGNOSIS — Z87891 Personal history of nicotine dependence: Secondary | ICD-10-CM | POA: Diagnosis not present

## 2022-07-26 DIAGNOSIS — I441 Atrioventricular block, second degree: Secondary | ICD-10-CM | POA: Diagnosis not present

## 2022-07-26 DIAGNOSIS — I2510 Atherosclerotic heart disease of native coronary artery without angina pectoris: Secondary | ICD-10-CM | POA: Diagnosis not present

## 2022-07-26 DIAGNOSIS — Z5111 Encounter for antineoplastic chemotherapy: Secondary | ICD-10-CM | POA: Diagnosis not present

## 2022-07-26 LAB — RAD ONC ARIA SESSION SUMMARY
Course Elapsed Days: 14
Plan Fractions Treated to Date: 11
Plan Prescribed Dose Per Fraction: 2 Gy
Plan Total Fractions Prescribed: 33
Plan Total Prescribed Dose: 66 Gy
Reference Point Dosage Given to Date: 22 Gy
Reference Point Session Dosage Given: 2 Gy
Session Number: 11

## 2022-07-27 ENCOUNTER — Ambulatory Visit
Admission: RE | Admit: 2022-07-27 | Discharge: 2022-07-27 | Disposition: A | Discharge status: Home / Self Care | Payer: Medicare Other | Source: Ambulatory Visit | Attending: Radiation Oncology | Admitting: Radiation Oncology

## 2022-07-27 ENCOUNTER — Other Ambulatory Visit: Payer: Self-pay

## 2022-07-27 DIAGNOSIS — Z87891 Personal history of nicotine dependence: Secondary | ICD-10-CM | POA: Diagnosis not present

## 2022-07-27 DIAGNOSIS — Z5111 Encounter for antineoplastic chemotherapy: Secondary | ICD-10-CM | POA: Diagnosis not present

## 2022-07-27 DIAGNOSIS — I441 Atrioventricular block, second degree: Secondary | ICD-10-CM | POA: Diagnosis not present

## 2022-07-27 DIAGNOSIS — C3412 Malignant neoplasm of upper lobe, left bronchus or lung: Secondary | ICD-10-CM | POA: Diagnosis not present

## 2022-07-27 DIAGNOSIS — Z510 Encounter for antineoplastic radiation therapy: Secondary | ICD-10-CM | POA: Diagnosis not present

## 2022-07-27 DIAGNOSIS — I2510 Atherosclerotic heart disease of native coronary artery without angina pectoris: Secondary | ICD-10-CM | POA: Diagnosis not present

## 2022-07-27 LAB — RAD ONC ARIA SESSION SUMMARY
Course Elapsed Days: 15
Plan Fractions Treated to Date: 12
Plan Prescribed Dose Per Fraction: 2 Gy
Plan Total Fractions Prescribed: 33
Plan Total Prescribed Dose: 66 Gy
Reference Point Dosage Given to Date: 24 Gy
Reference Point Session Dosage Given: 2 Gy
Session Number: 12

## 2022-07-28 ENCOUNTER — Encounter: Payer: Self-pay | Admitting: Cardiology

## 2022-07-28 ENCOUNTER — Ambulatory Visit (INDEPENDENT_AMBULATORY_CARE_PROVIDER_SITE_OTHER): Payer: Medicare Other | Admitting: Cardiology

## 2022-07-28 ENCOUNTER — Other Ambulatory Visit: Payer: Self-pay

## 2022-07-28 ENCOUNTER — Ambulatory Visit
Admission: RE | Admit: 2022-07-28 | Discharge: 2022-07-28 | Disposition: A | Discharge status: Home / Self Care | Payer: Medicare Other | Source: Ambulatory Visit | Attending: Radiation Oncology | Admitting: Radiation Oncology

## 2022-07-28 VITALS — BP 102/60 | HR 82 | Ht 67.0 in | Wt 174.8 lb

## 2022-07-28 DIAGNOSIS — I2510 Atherosclerotic heart disease of native coronary artery without angina pectoris: Secondary | ICD-10-CM | POA: Insufficient documentation

## 2022-07-28 DIAGNOSIS — I441 Atrioventricular block, second degree: Secondary | ICD-10-CM | POA: Insufficient documentation

## 2022-07-28 DIAGNOSIS — I10 Essential (primary) hypertension: Secondary | ICD-10-CM | POA: Insufficient documentation

## 2022-07-28 DIAGNOSIS — Z5111 Encounter for antineoplastic chemotherapy: Secondary | ICD-10-CM | POA: Diagnosis not present

## 2022-07-28 DIAGNOSIS — Z510 Encounter for antineoplastic radiation therapy: Secondary | ICD-10-CM | POA: Diagnosis not present

## 2022-07-28 DIAGNOSIS — C3412 Malignant neoplasm of upper lobe, left bronchus or lung: Secondary | ICD-10-CM | POA: Diagnosis not present

## 2022-07-28 DIAGNOSIS — Z87891 Personal history of nicotine dependence: Secondary | ICD-10-CM | POA: Diagnosis not present

## 2022-07-28 LAB — RAD ONC ARIA SESSION SUMMARY
Course Elapsed Days: 16
Plan Fractions Treated to Date: 13
Plan Prescribed Dose Per Fraction: 2 Gy
Plan Total Fractions Prescribed: 33
Plan Total Prescribed Dose: 66 Gy
Reference Point Dosage Given to Date: 26 Gy
Reference Point Session Dosage Given: 2 Gy
Session Number: 13

## 2022-07-28 MED FILL — Dexamethasone Sodium Phosphate Inj 100 MG/10ML: INTRAMUSCULAR | Qty: 1 | Status: AC

## 2022-07-28 NOTE — Progress Notes (Signed)
Cardiology Office Note:    Date:  07/28/2022   ID:  Antonio [name], DOB [birth year], MRN [id number]  PCP:  Doreene Nest, NP   [facility] Providers Cardiologist:  Debbe Odea, MD Electrophysiologist:  Sherryl Manges, MD     Referring MD: Doreene Nest, NP   Chief Complaint  Patient presents with   Follow-up    Patient denies new or acute cardiac problems/concerns today.      History of Present Illness:    Antonio [name] is a 77 y.o. male with a hx of nonobstructive CAD (LHC 05/2021- 20% RCA, 50% OM1), hypertension, hyperlipidemia, diabetes, COPD, former smoker x50+ years, PAD s/p bilateral SFA/popliteal stents, right carotid artery stent, non-small cell lung cancer s/p radiation therapy with mediastinal recurrence on chemotherapy who presents for follow-up.   Feels well, no new concerns at this time, blood pressure adequately controlled on lisinopril 20 mg daily.  Denies dizziness, presyncope or syncope.  Denies chest pain or any breathing issues.  Currently on chemo due to mediastinal recurrence of non-small cell lung cancer.  Prior notes Echo 03/2022 EF 60 to 65% LHC 05/2021- 20% RCA, 50% OM1  Past Medical History:  Diagnosis Date   Adenoma of left adrenal gland    Anemia    Aortic atherosclerosis (HCC)    Arthritis    Atrial fibrillation and flutter (HCC)    a.) CHA2DS2VASc = 7 (age x 2, HTN, CVA x 2, vascular disease history, T2DM);  b.) rate/rhythm maintained without pharmacological intervention; chronic antithrombotic therapy using ASA + clopidogrel   Bilateral carotid artery stenosis    a.) s/p PTA 04/07/2022 --> 90% RICA --> 9x7x30 Exact stent; b.) doppler 03/14/20214: 1-39% RICA, 40-59% LICA   BPH with urinary obstruction    stable on flomax ([name]   Childhood asthma    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   a.) CT head and MRI brain 04/05/2022: chronic cortical/subcortical RIGHT MCA territory infarct within the RIGHT parietal lobe and  RIGHT temporoparietal junction   Coronary atherosclerosis of unspecified type of vessel, native or graft    a.) cCTA 05/22/2021: Ca score 1524 (84th percentile for age/sex match control) --> FFRct: dLAD = 0.77, dLCx = 0.67, RPLA = 0.77, dOM1 = 0.66; b.) LHC 05/23/2021: 20% dRCA, 50% OM1 - med mgmt   Diastolic dysfunction    a.) TTE 04/06/2022: EF 60-65%, mod LVH, mild MR, AoV sclerosis, G1DD   DOE (dyspnea on exertion)    Emphysema of lung (HCC)    Erectile dysfunction    a.) on PDE5i (sildenafil)   Esophageal stricture    Fall    Gastric AVM    Gastritis    GERD (gastroesophageal reflux disease)    Hiatal hernia    Hx of colonic polyps    Hyperlipidemia    Hypertension    Internal hemorrhoids without mention of complication    Ischemic leg    Long term current use of antithrombotics/antiplatelets    a.) on DAPT (ASA + clopidogrel)   Lumbago    Mobitz (type) I (Wenckebach's) atrioventricular block    Non-small cell lung cancer (HCC)    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   a.) PCR (+) 05/29/2018, 04/07/2022   Pneumonia    PSA elevation    now averaging 2's   PVD (peripheral vascular disease) with claudication (HCC)    Sigmoid diverticulosis    Tobacco abuse    Type 2 diabetes mellitus treated with insulin (HCC)  Past Surgical History:  Procedure Laterality Date   2D Echo  07/2001   BACK SURGERY     CAROTID PTA/STENT INTERVENTION Right 04/07/2022   Procedure: CAROTID PTA/STENT INTERVENTION;  Surgeon: Renford Dills, MD;  Location: [facility] INVASIVE CV LAB;  Service: Cardiovascular;  Laterality: Right;   COLONOSCOPY     ENDOBRONCHIAL ULTRASOUND Left 06/19/2022   Procedure: ENDOBRONCHIAL ULTRASOUND;  Surgeon: Salena Saner, MD;  Location: [facility] ORS;  Service: Pulmonary;  Laterality: Left;   ESOPHAGOGASTRODUODENOSCOPY  02/13/2007   gastritis and duodenitis without bleed   ESOPHAGOGASTRODUODENOSCOPY N/A 06/26/2014   Procedure: ESOPHAGOGASTRODUODENOSCOPY (EGD);   Surgeon: Rachael Fee, MD;  Location[facility]COPY;  Service: Endoscopy;  Laterality: N/A;   ESOPHAGOGASTRODUODENOSCOPY (EGD) WITH PROPOFOL N/A 04/08/2020   Procedure: ESOPHAGOGASTRODUODENOSCOPY (EGD) WITH PROPOFOL;  Surgeon: Meryl Dare, MD;  Location: [facility] ENDOSCOPY;  Service: Endoscopy;  Laterality: N/A;   FLEXIBLE BRONCHOSCOPY Left 06/19/2022   Procedure: FLEXIBLE BRONCHOSCOPY;  Surgeon: Salena Saner, MD;  Location: [facility] ORS;  Service: Pulmonary;  Laterality: Left;   HOT HEMOSTASIS N/A 04/08/2020   Procedure: HOT HEMOSTASIS (ARGON PLASMA COAGULATION/BICAP);  Surgeon: Meryl Dare, MD;  Location: [facility] ENDOSCOPY;  Service: Endoscopy;  Laterality: N/A;   IR IMAGING GUIDED PORT INSERTION  06/30/2022   KNEE ARTHROSCOPY Right 11/1998   LEFT HEART CATH AND CORONARY ANGIOGRAPHY N/A 05/23/2021   Procedure: LEFT HEART CATH AND CORONARY ANGIOGRAPHY;  Surgeon: Orbie Pyo, MD;  Location: [facility] INVASIVE CV LAB;  Service: Cardiovascular;  Laterality: N/A;   LOWER EXTREMITY ANGIOGRAPHY Right 12/27/2017   Procedure: LOWER EXTREMITY ANGIOGRAPHY;  Surgeon: Annice Needy, MD;  Location: [facility] INVASIVE CV LAB;  Service: Cardiovascular;  Laterality: Right;   LOWER EXTREMITY ANGIOGRAPHY Left 01/10/2018   Procedure: LOWER EXTREMITY ANGIOGRAPHY;  Surgeon: Annice Needy, MD;  Location: [facility] INVASIVE CV LAB;  Service: Cardiovascular;  Laterality: Left;   LOWER EXTREMITY ANGIOGRAPHY Left 05/29/2018   Procedure: LOWER EXTREMITY ANGIOGRAPHY;  Surgeon: Annice Needy, MD;  Location: [facility] INVASIVE CV LAB;  Service: Cardiovascular;  Laterality: Left;   LOWER EXTREMITY ANGIOGRAPHY Left 05/30/2018   Procedure: Lower Extremity Angiography;  Surgeon: Annice Needy, MD;  Location: [facility] INVASIVE CV LAB;  Service: Cardiovascular;  Laterality: Left;   LP  07/2001   Microwave thermotherapy prostate  08/28/2007   [place]   Persantine cardiolite  04/1999   EF 68%   POLYPECTOMY  12/1993   Stress myoview  08/2004   EF 67%    UPPER GASTROINTESTINAL ENDOSCOPY      Current Medications: Current Meds  Medication Sig   acetaminophen (TYLENOL) 500 MG tablet Take 1,000 mg by mouth every 6 (six) hours. Rapid release   atorvastatin (LIPITOR) 20 MG tablet TAKE 1 TABLET BY MOUTH IN THE EVENING FOR CHOLESTEROL (Patient taking differently: Take 20 mg by mouth in the morning.)   clopidogrel (PLAVIX) 75 MG tablet Take 1 tablet (75 mg total) by mouth daily.   dexamethasone (DECADRON) 4 MG tablet Take 2 tablets daily for 2 days, start the day after chemotherapy. Take with food.   ferrous gluconate (FERGON) 324 MG tablet TAKE 1 TABLET BY MOUTH EVERY DAY WITH BREAKFAST (Patient taking differently: Take 324 mg by mouth daily with breakfast.)   FIBER PO Take 1 capsule by mouth in the morning.   finasteride (PROSCAR) 5 MG tablet Take 1 tablet (5 mg total) by mouth daily. (Patient taking differently: Take 5 mg by mouth every morning.)   Fluticasone-Umeclidin-Vilant (TRELEGY ELLIPTA) 100-62.5-25 MCG/ACT  AEPB Inhale 1 puff into the lungs daily.   glipiZIDE (GLUCOTROL) 10 MG tablet Take 10 mg by mouth daily before breakfast.   GLUCOSAMINE HCL PO Take 1 tablet by mouth daily.   Insulin Glargine (BASAGLAR KWIKPEN) 100 UNIT/ML Inject 30 Units into the skin every morning.   lidocaine-prilocaine (EMLA) cream Apply to affected area once   lisinopril (ZESTRIL) 20 MG tablet Take 1 tablet (20 mg total) by mouth daily. (Patient taking differently: Take 20 mg by mouth daily as needed. Patient reports taking if systolic over 160)   metFORMIN (GLUCOPHAGE) 500 MG tablet TAKE 2 TABLETS BY MOUTH TWICE A DAY WITH MEAL FOR DIABETES (Patient taking differently: Take 1,000 mg by mouth 2 (two) times daily. TAKE 2 TABLETS BY MOUTH TWICE A DAY WITH MEAL FOR DIABETES)   ondansetron (ZOFRAN) 8 MG tablet Take 1 tablet (8 mg total) by mouth every 8 (eight) hours as needed for nausea or vomiting. Start on the third day after chemotherapy.   pantoprazole (PROTONIX) 40 MG  tablet TAKE 1 TABLET (40 MG TOTAL) BY MOUTH DAILY. FOR HEARTBURN (Patient taking differently: Take 40 mg by mouth every evening. For heartburn)   prochlorperazine (COMPAZINE) 10 MG tablet Take 1 tablet (10 mg total) by mouth every 6 (six) hours as needed for nausea or vomiting.   sildenafil (VIAGRA) 100 MG tablet Take 1 tablet (100 mg total) by mouth daily as needed for erectile dysfunction.   tamsulosin (FLOMAX) 0.4 MG CAPS capsule Take 1 capsule (0.4 mg total) by mouth 2 (two) times daily.   vitamin B-12 (CYANOCOBALAMIN) 1000 MCG tablet Take 1,000 mcg by mouth in the morning.   [DISCONTINUED] Fluticasone-Umeclidin-Vilant (TRELEGY ELLIPTA) 100-62.5-25 MCG/ACT AEPB Inhale 1 puff into the lungs daily.   [DISCONTINUED] hydrALAZINE (APRESOLINE) 50 MG tablet Take 1 tablet (50 mg total) by mouth 3 (three) times daily as needed (If systolic BP greater than 150 mmHg.). If systolic BP greater than 150 mmHg.     Allergies:   Patient has no known allergies.   Social History   Socioeconomic History   Marital status: Widowed    Spouse name: Armando Reichert   Number of children: 3   Years of education: Not on file   Highest education level: Not on file  Occupational History   Occupation: Retired Music therapist  Tobacco Use   Smoking status: Former    Packs/day: 2.00    Years: 57.00    Additional pack years: 0.00    Total pack years: 114.00    Types: Cigarettes    Start date: 02/07/1964    Quit date: 05/11/2021    Years since quitting: 1.2   Smokeless tobacco: Former    Types: Chew   Tobacco comments:    Quit one year ago  Vaping Use   Vaping Use: Never used  Substance and Sexual Activity   Alcohol use: Yes    Comment: rare   Drug use: No   Sexual activity: Yes  Other Topics Concern   Not on file  Social History Narrative   Married 9/09 wife with moderate memory problems.   3 adult children, 2 grandchildren   Would desire CPR   Social Determinants of Health   Financial Resource Strain: Low Risk   (01/24/2022)   Overall Financial Resource Strain (CARDIA)    Difficulty of Paying Living Expenses: Not hard at all  Food Insecurity: No Food Insecurity (06/26/2022)   Hunger Vital Sign    Worried About Running Out of Food in the Last Year:  Never true    Ran Out of Food in the Last Year: Never true  Transportation Needs: No Transportation Needs (06/26/2022)   PRAPARE - Administrator, Civil Service (Medical): No    Lack of Transportation (Non-Medical): No  Physical Activity: Insufficiently Active (01/24/2022)   Exercise Vital Sign    Days of Exercise per Week: 3 days    Minutes of Exercise per Session: 30 min  Stress: No Stress Concern Present (01/24/2022)   [employer] of Occupational Health - Occupational Stress Questionnaire    Feeling of Stress : Not at all  Social Connections: Moderately Isolated (01/24/2022)   Social Connection and Isolation Panel [NHANES]    Frequency of Communication with Friends and Family: More than three times a week    Frequency of Social Gatherings with Friends and Family: Once a week    Attends Religious Services: More than 4 times per year    Active Member of [employer] or Organizations: No    Attends Banker Meetings: Never    Marital Status: Widowed     Family History: The patient's family history includes Alcohol abuse in his paternal uncle, paternal uncle, and paternal uncle; Diabetes in his father; Rheum arthritis in his mother. There is no history of Heart disease, Stroke, Cancer, Drug abuse, Depression, Colon cancer, Rectal cancer, Stomach cancer, Kidney cancer, Bladder Cancer, or Prostate cancer.  ROS:   Please see the history of present illness.     All other systems reviewed and are negative.  EKGs/Labs/Other Studies Reviewed:    The following studies were reviewed today:   EKG:  EKG not ordered today.   Recent Labs: 04/08/2022: Magnesium 1.8 07/24/2022: ALT 12; BUN 23; Creatinine 0.97; Hemoglobin 10.9; Platelet  Count 250; Potassium 4.3; Sodium 140  Recent Lipid Panel    Component Value Date/Time   CHOL 138 04/06/2022 1006   TRIG 243 (H) 04/06/2022 1006   HDL 41 04/06/2022 1006   CHOLHDL 3.4 04/06/2022 1006   VLDL 49 (H) 04/06/2022 1006   LDLCALC 48 04/06/2022 1006   LDLDIRECT 120.3 04/24/2011 1514     Risk Assessment/Calculations:          Physical Exam:    VS:  BP 102/60 (BP Location: Left Arm, Patient Position: Sitting, Cuff Size: Normal)   Pulse 82   Ht 5\' 7"  (1.702 m)   Wt 174 lb 12.8 oz (79.3 kg)   SpO2 97%   BMI 27.38 kg/m     Wt Readings from Last 3 Encounters:  07/28/22 174 lb 12.8 oz (79.3 kg)  07/24/22 180 lb 14.4 oz (82.1 kg)  07/17/22 180 lb 10.7 oz (81.9 kg)     GEN:  Well nourished, well developed in no acute distress HEENT: Normal NECK: No JVD; No carotid bruits CARDIAC: RRR, no murmurs, rubs, gallops RESPIRATORY: Diminished breath sounds, no wheezing ABDOMEN: Soft, non-tender, non-distended MUSCULOSKELETAL:  No edema; No deformity  SKIN: Warm and dry NEUROLOGIC:  Alert and oriented x 3 PSYCHIATRIC:  Normal affect   ASSESSMENT:    1. Coronary artery disease, non-occlusive   2. Second degree AV block, Mobitz type I   3. Primary hypertension    PLAN:    In order of problems listed above:  CAD (LHC 05/2021- 20% RCA, 50% OM1), denies chest pain.  Continue aspirin, Plavix, Lipitor 20. Mobitz 1 AV block.  Heart rate 52. no indication for pacemaker.  Continue to avoid AV nodal agents. Hypertension, BP controlled.  Continue lisinopril 20 mg  daily.    Follow-up in 1 year.     Medication Adjustments/Labs and Tests Ordered: Current medicines are reviewed at length with the patient today.  Concerns regarding medicines are outlined above.  No orders of the defined types were placed in this encounter.  No orders of the defined types were placed in this encounter.   Patient Instructions  Medication Instructions:   STOP hydralazine   *If you need a  refill on your cardiac medications before your next appointment, please call your pharmacy*   Lab Work:  None Ordered  If you have labs (blood work) drawn today and your tests are completely normal, you will receive your results only by: MyChart Message (if you have MyChart) OR A paper copy in the mail If you have any lab test that is abnormal or we need to change your treatment, we will call you to review the results.   Testing/Procedures:   None Ordered   Follow-Up: At [facility], you and your health needs are our priority.  As part of our continuing mission to provide you with exceptional heart care, we have created designated Provider Care Teams.  These Care Teams include your primary Cardiologist (physician) and Advanced Practice Providers (APPs -  Physician Assistants and Nurse Practitioners) who all work together to provide you with the care you need, when you need it.  We recommend signing up for the patient portal called "MyChart".  Sign up information is provided on this After Visit Summary.  MyChart is used to connect with patients for Virtual Visits (Telemedicine).  Patients are able to view lab/test results, encounter notes, upcoming appointments, etc.  Non-urgent messages can be sent to your provider as well.   To learn more about what you can do with MyChart, go to [email or web address].    Your next appointment:   12 month(s)  Provider:   You may see Debbe Odea, MD or one of the following Advanced Practice Providers on your designated Care Team:   Nicolasa Ducking, NP Eula Listen, PA-C Cadence Fransico Michael, PA-C Charlsie Quest, NP    Signed, Debbe Odea, MD  07/28/2022 12:19 PM    [facility]

## 2022-07-28 NOTE — Patient Instructions (Addendum)
Medication Instructions:   STOP hydralazine   *If you need a refill on your cardiac medications before your next appointment, please call your pharmacy*   Lab Work:  None Ordered  If you have labs (blood work) drawn today and your tests are completely normal, you will receive your results only by: MyChart Message (if you have MyChart) OR A paper copy in the mail If you have any lab test that is abnormal or we need to change your treatment, we will call you to review the results.   Testing/Procedures:   None Ordered   Follow-Up: At [facility], you and your health needs are our priority.  As part of our continuing mission to provide you with exceptional heart care, we have created designated Provider Care Teams.  These Care Teams include your primary Cardiologist (physician) and Advanced Practice Providers (APPs -  Physician Assistants and Nurse Practitioners) who all work together to provide you with the care you need, when you need it.  We recommend signing up for the patient portal called "MyChart".  Sign up information is provided on this After Visit Summary.  MyChart is used to connect with patients for Virtual Visits (Telemedicine).  Patients are able to view lab/test results, encounter notes, upcoming appointments, etc.  Non-urgent messages can be sent to your provider as well.   To learn more about what you can do with MyChart, go to [email or web address].    Your next appointment:   12 month(s)  Provider:   You may see Debbe Odea, MD or one of the following Advanced Practice Providers on your designated Care Team:   Nicolasa Ducking, NP Eula Listen, PA-C Cadence Fransico Michael, PA-C Charlsie Quest, NP

## 2022-07-31 ENCOUNTER — Inpatient Hospital Stay: Payer: Medicare Other

## 2022-07-31 ENCOUNTER — Other Ambulatory Visit: Payer: Self-pay

## 2022-07-31 ENCOUNTER — Ambulatory Visit
Admission: RE | Admit: 2022-07-31 | Discharge: 2022-07-31 | Disposition: A | Discharge status: Home / Self Care | Payer: Medicare Other | Source: Ambulatory Visit | Attending: Radiation Oncology | Admitting: Radiation Oncology

## 2022-07-31 VITALS — BP 113/80 | HR 56 | Temp 97.0°F | Resp 18 | Wt 181.0 lb

## 2022-07-31 DIAGNOSIS — C3490 Malignant neoplasm of unspecified part of unspecified bronchus or lung: Secondary | ICD-10-CM

## 2022-07-31 DIAGNOSIS — I2510 Atherosclerotic heart disease of native coronary artery without angina pectoris: Secondary | ICD-10-CM | POA: Diagnosis not present

## 2022-07-31 DIAGNOSIS — Z510 Encounter for antineoplastic radiation therapy: Secondary | ICD-10-CM | POA: Diagnosis not present

## 2022-07-31 DIAGNOSIS — Z87891 Personal history of nicotine dependence: Secondary | ICD-10-CM | POA: Diagnosis not present

## 2022-07-31 DIAGNOSIS — Z5111 Encounter for antineoplastic chemotherapy: Secondary | ICD-10-CM | POA: Diagnosis not present

## 2022-07-31 DIAGNOSIS — C3412 Malignant neoplasm of upper lobe, left bronchus or lung: Secondary | ICD-10-CM | POA: Diagnosis not present

## 2022-07-31 DIAGNOSIS — I441 Atrioventricular block, second degree: Secondary | ICD-10-CM | POA: Diagnosis not present

## 2022-07-31 LAB — CBC WITH DIFFERENTIAL (CANCER CENTER ONLY)
Abs Immature Granulocytes: 0.05 10*3/uL (ref 0.00–0.07)
Basophils Absolute: 0 10*3/uL (ref 0.0–0.1)
Basophils Relative: 1 %
Eosinophils Absolute: 0.2 10*3/uL (ref 0.0–0.5)
Eosinophils Relative: 4 %
HCT: 33 % — ABNORMAL LOW (ref 39.0–52.0)
Hemoglobin: 10.8 g/dL — ABNORMAL LOW (ref 13.0–17.0)
Immature Granulocytes: 1 %
Lymphocytes Relative: 13 %
Lymphs Abs: 0.5 10*3/uL — ABNORMAL LOW (ref 0.7–4.0)
MCH: 29 pg (ref 26.0–34.0)
MCHC: 32.7 g/dL (ref 30.0–36.0)
MCV: 88.5 fL (ref 80.0–100.0)
Monocytes Absolute: 0.3 10*3/uL (ref 0.1–1.0)
Monocytes Relative: 7 %
Neutro Abs: 3.2 10*3/uL (ref 1.7–7.7)
Neutrophils Relative %: 74 %
Platelet Count: 197 10*3/uL (ref 150–400)
RBC: 3.73 MIL/uL — ABNORMAL LOW (ref 4.22–5.81)
RDW: 15.1 % (ref 11.5–15.5)
WBC Count: 4.3 10*3/uL (ref 4.0–10.5)
nRBC: 0 % (ref 0.0–0.2)

## 2022-07-31 LAB — RAD ONC ARIA SESSION SUMMARY
Course Elapsed Days: 19
Plan Fractions Treated to Date: 14
Plan Prescribed Dose Per Fraction: 2 Gy
Plan Total Fractions Prescribed: 33
Plan Total Prescribed Dose: 66 Gy
Reference Point Dosage Given to Date: 28 Gy
Reference Point Session Dosage Given: 2 Gy
Session Number: 14

## 2022-07-31 LAB — CMP (CANCER CENTER ONLY)
ALT: 11 U/L (ref 0–44)
AST: 14 U/L — ABNORMAL LOW (ref 15–41)
Albumin: 3.3 g/dL — ABNORMAL LOW (ref 3.5–5.0)
Alkaline Phosphatase: 55 U/L (ref 38–126)
Anion gap: 8 (ref 5–15)
BUN: 27 mg/dL — ABNORMAL HIGH (ref 8–23)
CO2: 22 mmol/L (ref 22–32)
Calcium: 8.5 mg/dL — ABNORMAL LOW (ref 8.9–10.3)
Chloride: 107 mmol/L (ref 98–111)
Creatinine: 0.96 mg/dL (ref 0.61–1.24)
GFR, Estimated: >60 mL/min (ref >60)
Glucose, Bld: 158 mg/dL — ABNORMAL HIGH (ref 70–99)
Potassium: 4.6 mmol/L (ref 3.5–5.1)
Sodium: 137 mmol/L (ref 135–145)
Total Bilirubin: 0.3 mg/dL (ref 0.3–1.2)
Total Protein: 5.8 g/dL — ABNORMAL LOW (ref 6.5–8.1)

## 2022-07-31 MED ORDER — PALONOSETRON HCL INJECTION 0.25 MG/5ML
0.2500 mg | Freq: Once | INTRAVENOUS | Status: AC
  Administered 2022-07-31: 0.25 mg via INTRAVENOUS
  Filled 2022-07-31: qty 5

## 2022-07-31 MED ORDER — FAMOTIDINE IN NACL 20-0.9 MG/50ML-% IV SOLN
20.0000 mg | Freq: Once | INTRAVENOUS | Status: AC
  Administered 2022-07-31: 20 mg via INTRAVENOUS

## 2022-07-31 MED ORDER — SODIUM CHLORIDE 0.9 % IV SOLN
195.4000 mg | Freq: Once | INTRAVENOUS | Status: AC
  Administered 2022-07-31: 200 mg via INTRAVENOUS
  Filled 2022-07-31: qty 20

## 2022-07-31 MED ORDER — SODIUM CHLORIDE 0.9 % IV SOLN
Freq: Once | INTRAVENOUS | Status: AC
  Filled 2022-07-31: qty 250

## 2022-07-31 MED ORDER — DIPHENHYDRAMINE HCL 50 MG/ML IJ SOLN
50.0000 mg | Freq: Once | INTRAMUSCULAR | Status: AC
  Administered 2022-07-31: 50 mg via INTRAVENOUS
  Filled 2022-07-31: qty 1

## 2022-07-31 MED ORDER — SODIUM CHLORIDE 0.9 % IV SOLN
10.0000 mg | Freq: Once | INTRAVENOUS | Status: AC
  Administered 2022-07-31: 10 mg via INTRAVENOUS
  Filled 2022-07-31: qty 10

## 2022-07-31 MED ORDER — SODIUM CHLORIDE 0.9 % IV SOLN
45.0000 mg/m2 | Freq: Once | INTRAVENOUS | Status: AC
  Administered 2022-07-31: 90 mg via INTRAVENOUS
  Filled 2022-07-31: qty 15

## 2022-07-31 MED ORDER — HEPARIN SOD (PORK) LOCK FLUSH 100 UNIT/ML IV SOLN
500.0000 [IU] | Freq: Once | INTRAVENOUS | Status: AC | PRN
  Administered 2022-07-31: 500 [IU]
  Filled 2022-07-31: qty 5

## 2022-07-31 NOTE — Patient Instructions (Signed)
[facility] [place] [facility]  Discharge Instructions: Thank you for choosing [facility] to provide your oncology and hematology care.  If you have a lab appointment with the Cancer Center, please go directly to the Cancer Center and check in at the registration area.  Wear comfortable clothing and clothing appropriate for easy access to any Portacath or PICC line.   We strive to give you quality time with your provider. You may need to reschedule your appointment if you arrive late (15 or more minutes).  Arriving late affects you and other patients whose appointments are after yours.  Also, if you miss three or more appointments without notifying the office, you may be dismissed from the clinic at the provider's discretion.      For prescription refill requests, have your pharmacy contact our office and allow 72 hours for refills to be completed.    Today you received the following chemotherapy and/or immunotherapy agents Taxol and Carboplatin        To help prevent nausea and vomiting after your treatment, we encourage you to take your nausea medication as directed.  BELOW ARE SYMPTOMS THAT SHOULD BE REPORTED IMMEDIATELY: *FEVER GREATER THAN 100.4 F (38 C) OR HIGHER *CHILLS OR SWEATING *NAUSEA AND VOMITING THAT IS NOT CONTROLLED WITH YOUR NAUSEA MEDICATION *UNUSUAL SHORTNESS OF BREATH *UNUSUAL BRUISING OR BLEEDING *URINARY PROBLEMS (pain or burning when urinating, or frequent urination) *BOWEL PROBLEMS (unusual diarrhea, constipation, pain near the anus) TENDERNESS IN MOUTH AND THROAT WITH OR WITHOUT PRESENCE OF ULCERS (sore throat, sores in mouth, or a toothache) UNUSUAL RASH, SWELLING OR PAIN  UNUSUAL VAGINAL DISCHARGE OR ITCHING   Items with * indicate a potential emergency and should be followed up as soon as possible or go to the Emergency Department if any problems should occur.  Please show the CHEMOTHERAPY ALERT CARD or IMMUNOTHERAPY ALERT CARD  at check-in to the Emergency Department and triage nurse.  Should you have questions after your visit or need to cancel or reschedule your appointment, please contact [facility] [place] [facility]  [phone] and follow the prompts.  Office hours are 8:00 a.m. to 4:30 p.m. Monday - Friday. Please note that voicemails left after 4:00 p.m. may not be returned until the following business day.  We are closed weekends and major holidays. You have access to a nurse at all times for urgent questions. Please call the main number to the clinic [phone] and follow the prompts.  For any non-urgent questions, you may also contact your provider using MyChart. We now offer e-Visits for anyone 18 and older to request care online for non-urgent symptoms. For details visit [email or web address].   Also download the MyChart app! Go to the app store, search "MyChart", open the app, select [facility], and log in with your MyChart username and password.    

## 2022-08-01 ENCOUNTER — Inpatient Hospital Stay: Payer: Medicare Other

## 2022-08-01 ENCOUNTER — Ambulatory Visit (INDEPENDENT_AMBULATORY_CARE_PROVIDER_SITE_OTHER): Payer: Medicare Other | Admitting: Pulmonary Disease

## 2022-08-01 ENCOUNTER — Encounter: Payer: Self-pay | Admitting: Pulmonary Disease

## 2022-08-01 ENCOUNTER — Ambulatory Visit
Admission: RE | Admit: 2022-08-01 | Discharge: 2022-08-01 | Disposition: A | Discharge status: Home / Self Care | Payer: Medicare Other | Source: Ambulatory Visit | Attending: Radiation Oncology | Admitting: Radiation Oncology

## 2022-08-01 ENCOUNTER — Other Ambulatory Visit: Payer: Self-pay

## 2022-08-01 VITALS — BP 124/80 | HR 50 | Temp 97.5°F | Ht 67.0 in | Wt 180.4 lb

## 2022-08-01 DIAGNOSIS — C3490 Malignant neoplasm of unspecified part of unspecified bronchus or lung: Secondary | ICD-10-CM

## 2022-08-01 DIAGNOSIS — Z87891 Personal history of nicotine dependence: Secondary | ICD-10-CM | POA: Diagnosis not present

## 2022-08-01 DIAGNOSIS — I441 Atrioventricular block, second degree: Secondary | ICD-10-CM | POA: Diagnosis not present

## 2022-08-01 DIAGNOSIS — I2510 Atherosclerotic heart disease of native coronary artery without angina pectoris: Secondary | ICD-10-CM | POA: Diagnosis not present

## 2022-08-01 DIAGNOSIS — Z5111 Encounter for antineoplastic chemotherapy: Secondary | ICD-10-CM | POA: Diagnosis not present

## 2022-08-01 DIAGNOSIS — J449 Chronic obstructive pulmonary disease, unspecified: Secondary | ICD-10-CM

## 2022-08-01 DIAGNOSIS — Z510 Encounter for antineoplastic radiation therapy: Secondary | ICD-10-CM | POA: Diagnosis not present

## 2022-08-01 DIAGNOSIS — C3412 Malignant neoplasm of upper lobe, left bronchus or lung: Secondary | ICD-10-CM | POA: Diagnosis not present

## 2022-08-01 LAB — RAD ONC ARIA SESSION SUMMARY
Course Elapsed Days: 20
Plan Fractions Treated to Date: 15
Plan Prescribed Dose Per Fraction: 2 Gy
Plan Total Fractions Prescribed: 33
Plan Total Prescribed Dose: 66 Gy
Reference Point Dosage Given to Date: 30 Gy
Reference Point Session Dosage Given: 2 Gy
Session Number: 15

## 2022-08-01 NOTE — Progress Notes (Signed)
Subjective:    Patient ID: Antonio [name], male    DOB: [birth year], 77 y.o.   MRN: [id number]  Patient Care Team: Doreene Nest, NP as PCP - General (Internal Medicine) Debbe Odea, MD as PCP - Cardiology (Cardiology) Duke Salvia, MD as PCP - Electrophysiology (Cardiology) Hulan Fray as Physician Assistant (Urology) Meryl Dare, MD as Consulting Physician (Gastroenterology) Kathyrn Sheriff, [facility] (Inactive) as Pharmacist (Pharmacist) Glory Buff, RN as Oncology Nurse Navigator Creig Hines, MD as Consulting Physician (Oncology)  Chief Complaint  Patient presents with   Follow-up    Nodule. Bronchoscopy 06/19/2022. DOE. Occasional wheezing and dry cough.   HPI This is a 77 year old former smoker (half PPD, 114 PY) who presents for follow-up on bronchoscopy with EBUS/TBNA performed on 19 Jun 2022.  Recall that previously he had had a left perihilar lung nodule found during routine lung cancer screening.  He underwent robotic assisted bronchoscopy on 18 May 2021.  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  He had surveillance CT on 11 May 2022 following his SBRT treatment.  There were areas of thickening of tissue along the left mainstem bronchus and some narrowing of the lingula bronchus.  A PET/CT was performed on 23 April that showed a high left paratracheal node with increased SUV as well as a low left paratracheal node with a max SUV of 7.5.  He underwent bronchoscopy with EBUS/TBNA where his station 4L, left paratracheal, was sampled and findings were consistent with adenocarcinoma.  He is now undergoing chemo XRT.  He is on Trelegy Ellipta for COPD.  He has not had PFTs as ordered in September.  These will be reordered once he completes chemo XRT. He has not had any orthopnea or paroxysmal nocturnal dyspnea.  No chest pain, no lower extremity edema nor calf tenderness.  He notes some shortness of  breath with walking up inclines or picking up heavy objects but this has been better since he is on Trelegy.  He does not note wheezing since he has been on inhalers.   He has had minimal side effects from his chemoradiation and is tolerating his treatments well.  Circulogene assay did not show any actionable variants by NGS.  No PD-L1 expression and a TP 53 variant detected.   Review of Systems A 10 point review of systems was performed and it is as noted above otherwise negative.   Patient Active Problem List   Diagnosis Date Noted   Recurrent non-small cell lung cancer (HCC) 06/30/2022   Mediastinal adenopathy 06/19/2022   Hematoma 05/22/2022   Right leg pain 05/22/2022   Bilateral carotid artery stenosis 04/06/2022   Stroke-like symptoms 04/06/2022   Encephalopathy 04/05/2022   UTI (urinary tract infection) 04/05/2022   History of CVA (cerebrovascular accident) 04/05/2022   Fatigue 12/21/2021   Hearing loss of right ear due to cerumen impaction 10/05/2021   Non-small cell lung cancer (HCC) 06/19/2021   Aortic atherosclerosis (HCC) 06/19/2021   Atrioventricular block, Mobitz type 1, Wenckebach 05/25/2021   Complete heart block (HCC) 05/20/2021   Gastric AVM    Iron deficiency anemia    Right buttock pain 01/12/2020   Fall 01/12/2020   Rash and nonspecific skin eruption 12/19/2019   Chronic pain of right hip 10/17/2019   Ischemic leg 05/29/2018   B12 deficiency 04/03/2018   PAD (peripheral artery disease) (HCC) 12/12/2017   Pain in limb 12/11/2017   GERD (gastroesophageal reflux disease) 04/30/2017  BPH with obstruction/lower urinary tract symptoms 10/14/2014   Essential hypertension 06/26/2014   Type 2 diabetes mellitus with hyperglycemia (HCC) 06/26/2014   HLD (hyperlipidemia) 06/27/2006   Coronary atherosclerosis 06/27/2006   Tobacco abuse 06/27/2006    Social History   Tobacco Use   Smoking status: Former    Packs/day: 2.00    Years: 57.00    Additional pack  years: 0.00    Total pack years: 114.00    Types: Cigarettes    Start date: 02/07/1964    Quit date: 05/11/2021    Years since quitting: 1.2   Smokeless tobacco: Former    Types: Chew   Tobacco comments:    Quit one year ago  Substance Use Topics   Alcohol use: Yes    Comment: rare    No Known Allergies  Current Meds  Medication Sig   acetaminophen (TYLENOL) 500 MG tablet Take 1,000 mg by mouth every 6 (six) hours. Rapid release   atorvastatin (LIPITOR) 20 MG tablet TAKE 1 TABLET BY MOUTH IN THE EVENING FOR CHOLESTEROL (Patient taking differently: Take 20 mg by mouth in the morning.)   clopidogrel (PLAVIX) 75 MG tablet Take 1 tablet (75 mg total) by mouth daily.   dexamethasone (DECADRON) 4 MG tablet Take 2 tablets daily for 2 days, start the day after chemotherapy. Take with food.   ferrous gluconate (FERGON) 324 MG tablet TAKE 1 TABLET BY MOUTH EVERY DAY WITH BREAKFAST (Patient taking differently: Take 324 mg by mouth daily with breakfast.)   FIBER PO Take 1 capsule by mouth in the morning.   finasteride (PROSCAR) 5 MG tablet Take 1 tablet (5 mg total) by mouth daily. (Patient taking differently: Take 5 mg by mouth every morning.)   Fluticasone-Umeclidin-Vilant (TRELEGY ELLIPTA) 100-62.5-25 MCG/ACT AEPB Inhale 1 puff into the lungs daily.   glipiZIDE (GLUCOTROL) 10 MG tablet Take 10 mg by mouth daily before breakfast.   GLUCOSAMINE HCL PO Take 1 tablet by mouth daily.   Insulin Glargine (BASAGLAR KWIKPEN) 100 UNIT/ML Inject 30 Units into the skin every morning.   lidocaine-prilocaine (EMLA) cream Apply to affected area once   lisinopril (ZESTRIL) 20 MG tablet Take 1 tablet (20 mg total) by mouth daily. (Patient taking differently: Take 20 mg by mouth daily as needed. Patient reports taking if systolic over 160)   metFORMIN (GLUCOPHAGE) 500 MG tablet TAKE 2 TABLETS BY MOUTH TWICE A DAY WITH MEAL FOR DIABETES (Patient taking differently: Take 1,000 mg by mouth 2 (two) times daily. TAKE 2  TABLETS BY MOUTH TWICE A DAY WITH MEAL FOR DIABETES)   ondansetron (ZOFRAN) 8 MG tablet Take 1 tablet (8 mg total) by mouth every 8 (eight) hours as needed for nausea or vomiting. Start on the third day after chemotherapy.   pantoprazole (PROTONIX) 40 MG tablet TAKE 1 TABLET (40 MG TOTAL) BY MOUTH DAILY. FOR HEARTBURN (Patient taking differently: Take 40 mg by mouth every evening. For heartburn)   prochlorperazine (COMPAZINE) 10 MG tablet Take 1 tablet (10 mg total) by mouth every 6 (six) hours as needed for nausea or vomiting.   sildenafil (VIAGRA) 100 MG tablet Take 1 tablet (100 mg total) by mouth daily as needed for erectile dysfunction.   tamsulosin (FLOMAX) 0.4 MG CAPS capsule Take 1 capsule (0.4 mg total) by mouth 2 (two) times daily.   vitamin B-12 (CYANOCOBALAMIN) 1000 MCG tablet Take 1,000 mcg by mouth in the morning.    Immunization History  Administered Date(s) Administered   [place]  07/09/2019, 08/06/2019   Pneumococcal Conjugate-13 09/30/2018   Td 10/08/1994, 11/19/2006      Objective:     BP 124/80 (BP Location: Left Arm, Cuff Size: Normal)   Pulse (!) 50   Temp (!) 97.5 F (36.4 C)   Ht 5\' 7"  (1.702 m)   Wt 180 lb 6.4 oz (81.8 kg)   SpO2 98%   BMI 28.25 kg/m   SpO2: 98 % O2 Device: None (Room air)  GENERAL: Well-developed, well-nourished gentleman, no acute distress, fully ambulatory. HEAD: Normocephalic, atraumatic.  EYES: Pupils equal, round, reactive to light.  No scleral icterus.  MOUTH: Poor dentition, no thrush. NECK: Supple. No thyromegaly. Trachea midline. No JVD.  No adenopathy. PULMONARY: Good air entry bilaterally.  Coarse, otherwise, no adventitious sounds. CARDIOVASCULAR: S1 and S2. Regular rate and rhythm.  No rubs, murmurs or gallops heard. ABDOMEN: Benign. MUSCULOSKELETAL: No joint deformity, no clubbing, no edema.  NEUROLOGIC: No overt focal deficit, no gait disturbance, speech is fluent. SKIN: Intact,warm,dry.  On  limited exam no rashes PSYCH: Mood and behavior normal.    Assessment & Plan:     ICD-10-CM   1. Chronic obstructive pulmonary disease, unspecified COPD type (HCC)  J44.9    Continue Trelegy Ellipta Continue as needed albuterol Will need to reorder PFTs once he completes chemoradiation    2. Adenocarcinoma of lung, unspecified laterality (HCC)  C34.90    This issue has complexity to this management Follows with medical and radiation oncology      Will see the patient in follow-up in 3 months time he is to contact us prior to that time should any new problems arise.  Gailen Shelter, MD Advanced Bronchoscopy PCCM [facility]    *This note was dictated using voice recognition software/Dragon.  Despite best efforts to proofread, errors can occur which can change the meaning. Any transcriptional errors that result from this process are unintentional and may not be fully corrected at the time of dictation.

## 2022-08-01 NOTE — Patient Instructions (Addendum)
Continue taking Trelegy Ellipta.  Will see you in follow-up in 3 months time call sooner should any new problems arise.

## 2022-08-01 NOTE — Progress Notes (Signed)
Nutrition Assessment   Reason for Assessment:   Referral MOC   ASSESSMENT:   77 year old male with stage I non-small cell lung cancer with mediastinal recurrence.  Past medical history of afib, CAD, gastric AVM, emphysema, GERD, PVD, DM.  Patient receiving concurrent chemotherapy and radiation.   Met with patient following radiation.  Patient reports that he is eating.  Usually has cereal or sausage biscuit or pancakes for breakfast. Lunch is sandwich and dinner is meat and couple of sides.  Not drinking shakes at this time.  Drinks water and sugar free drinks.  Complains of foods stopping mid chest area   Medications: lantus, fiber, compazine, zofran, vit B 12, glipizide, metformin, protonix   Labs: glucose 158, calcium 8.5   Anthropometrics:   Height: 67 inches Weight: 180 lb 6.4 oz  UBW: Reports weight loss of 11 lbs 184 lb 4 oz on 4/15 BMI: 28  2% weight loss in the last 2 months   Estimated Energy Needs  Kcals: 2025-2400 Protein: 101-120 g Fluid: 2025-2400 ml   NUTRITION DIAGNOSIS:  Unintentional weight loss related to cancer and related treatment as evidenced by 2% weight loss in the last 2 months    INTERVENTION:  Recommend patient start low sugar oral nutrition supplement. Samples of glucerna provided and coupons.  Encouraged 1-2 a day Encouraged patient to discuss dysphagia with MD Encouraged protein foods for added nutrition Contact information provided   MONITORING, EVALUATION, GOAL: weight trends, intake   Next Visit: Tuesday, July 9 following radiation  Kennya Schwenn B. Freida Busman, RD, LDN Registered Dietitian [phone]

## 2022-08-02 ENCOUNTER — Other Ambulatory Visit: Payer: Self-pay

## 2022-08-02 ENCOUNTER — Ambulatory Visit
Admission: RE | Admit: 2022-08-02 | Discharge: 2022-08-02 | Disposition: A | Discharge status: Home / Self Care | Payer: Medicare Other | Source: Ambulatory Visit | Attending: Radiation Oncology | Admitting: Radiation Oncology

## 2022-08-02 DIAGNOSIS — Z510 Encounter for antineoplastic radiation therapy: Secondary | ICD-10-CM | POA: Diagnosis not present

## 2022-08-02 DIAGNOSIS — I441 Atrioventricular block, second degree: Secondary | ICD-10-CM | POA: Diagnosis not present

## 2022-08-02 DIAGNOSIS — C3412 Malignant neoplasm of upper lobe, left bronchus or lung: Secondary | ICD-10-CM | POA: Diagnosis not present

## 2022-08-02 DIAGNOSIS — I2510 Atherosclerotic heart disease of native coronary artery without angina pectoris: Secondary | ICD-10-CM | POA: Diagnosis not present

## 2022-08-02 DIAGNOSIS — Z5111 Encounter for antineoplastic chemotherapy: Secondary | ICD-10-CM | POA: Diagnosis not present

## 2022-08-02 DIAGNOSIS — Z87891 Personal history of nicotine dependence: Secondary | ICD-10-CM | POA: Diagnosis not present

## 2022-08-02 LAB — RAD ONC ARIA SESSION SUMMARY
Course Elapsed Days: 21
Plan Fractions Treated to Date: 16
Plan Prescribed Dose Per Fraction: 2 Gy
Plan Total Fractions Prescribed: 33
Plan Total Prescribed Dose: 66 Gy
Reference Point Dosage Given to Date: 32 Gy
Reference Point Session Dosage Given: 2 Gy
Session Number: 16

## 2022-08-03 ENCOUNTER — Ambulatory Visit
Admission: RE | Admit: 2022-08-03 | Discharge: 2022-08-03 | Disposition: A | Discharge status: Home / Self Care | Payer: Medicare Other | Source: Ambulatory Visit | Attending: Radiation Oncology | Admitting: Radiation Oncology

## 2022-08-03 ENCOUNTER — Other Ambulatory Visit: Payer: Self-pay

## 2022-08-03 DIAGNOSIS — Z87891 Personal history of nicotine dependence: Secondary | ICD-10-CM | POA: Diagnosis not present

## 2022-08-03 DIAGNOSIS — Z5111 Encounter for antineoplastic chemotherapy: Secondary | ICD-10-CM | POA: Diagnosis not present

## 2022-08-03 DIAGNOSIS — Z510 Encounter for antineoplastic radiation therapy: Secondary | ICD-10-CM | POA: Diagnosis not present

## 2022-08-03 DIAGNOSIS — I2510 Atherosclerotic heart disease of native coronary artery without angina pectoris: Secondary | ICD-10-CM | POA: Diagnosis not present

## 2022-08-03 DIAGNOSIS — I441 Atrioventricular block, second degree: Secondary | ICD-10-CM | POA: Diagnosis not present

## 2022-08-03 DIAGNOSIS — C3412 Malignant neoplasm of upper lobe, left bronchus or lung: Secondary | ICD-10-CM | POA: Diagnosis not present

## 2022-08-03 LAB — RAD ONC ARIA SESSION SUMMARY
Course Elapsed Days: 22
Plan Fractions Treated to Date: 17
Plan Prescribed Dose Per Fraction: 2 Gy
Plan Total Fractions Prescribed: 33
Plan Total Prescribed Dose: 66 Gy
Reference Point Dosage Given to Date: 34 Gy
Reference Point Session Dosage Given: 2 Gy
Session Number: 17

## 2022-08-04 ENCOUNTER — Ambulatory Visit
Admission: RE | Admit: 2022-08-04 | Discharge: 2022-08-04 | Disposition: A | Discharge status: Home / Self Care | Payer: Medicare Other | Source: Ambulatory Visit | Attending: Radiation Oncology | Admitting: Radiation Oncology

## 2022-08-04 ENCOUNTER — Other Ambulatory Visit: Payer: Self-pay

## 2022-08-04 DIAGNOSIS — I441 Atrioventricular block, second degree: Secondary | ICD-10-CM | POA: Diagnosis not present

## 2022-08-04 DIAGNOSIS — Z87891 Personal history of nicotine dependence: Secondary | ICD-10-CM | POA: Diagnosis not present

## 2022-08-04 DIAGNOSIS — Z5111 Encounter for antineoplastic chemotherapy: Secondary | ICD-10-CM | POA: Diagnosis not present

## 2022-08-04 DIAGNOSIS — I2510 Atherosclerotic heart disease of native coronary artery without angina pectoris: Secondary | ICD-10-CM | POA: Diagnosis not present

## 2022-08-04 DIAGNOSIS — Z510 Encounter for antineoplastic radiation therapy: Secondary | ICD-10-CM | POA: Diagnosis not present

## 2022-08-04 DIAGNOSIS — C3412 Malignant neoplasm of upper lobe, left bronchus or lung: Secondary | ICD-10-CM | POA: Diagnosis not present

## 2022-08-04 LAB — RAD ONC ARIA SESSION SUMMARY
Course Elapsed Days: 23
Plan Fractions Treated to Date: 18
Plan Prescribed Dose Per Fraction: 2 Gy
Plan Total Fractions Prescribed: 33
Plan Total Prescribed Dose: 66 Gy
Reference Point Dosage Given to Date: 36 Gy
Reference Point Session Dosage Given: 2 Gy
Session Number: 18

## 2022-08-04 MED FILL — Dexamethasone Sodium Phosphate Inj 100 MG/10ML: INTRAMUSCULAR | Qty: 1 | Status: AC

## 2022-08-07 ENCOUNTER — Inpatient Hospital Stay: Payer: Medicare Other

## 2022-08-07 ENCOUNTER — Other Ambulatory Visit: Payer: Self-pay

## 2022-08-07 ENCOUNTER — Inpatient Hospital Stay: Payer: Medicare Other | Attending: Oncology

## 2022-08-07 ENCOUNTER — Ambulatory Visit
Admission: RE | Admit: 2022-08-07 | Discharge: 2022-08-07 | Disposition: A | Discharge status: Home / Self Care | Payer: Medicare Other | Source: Ambulatory Visit | Attending: Radiation Oncology | Admitting: Radiation Oncology

## 2022-08-07 ENCOUNTER — Inpatient Hospital Stay (HOSPITAL_BASED_OUTPATIENT_CLINIC_OR_DEPARTMENT_OTHER): Payer: Medicare Other | Admitting: Internal Medicine

## 2022-08-07 DIAGNOSIS — I10 Essential (primary) hypertension: Secondary | ICD-10-CM | POA: Insufficient documentation

## 2022-08-07 DIAGNOSIS — Z87891 Personal history of nicotine dependence: Secondary | ICD-10-CM | POA: Insufficient documentation

## 2022-08-07 DIAGNOSIS — I2510 Atherosclerotic heart disease of native coronary artery without angina pectoris: Secondary | ICD-10-CM | POA: Insufficient documentation

## 2022-08-07 DIAGNOSIS — C3490 Malignant neoplasm of unspecified part of unspecified bronchus or lung: Secondary | ICD-10-CM | POA: Diagnosis not present

## 2022-08-07 DIAGNOSIS — C3411 Malignant neoplasm of upper lobe, right bronchus or lung: Secondary | ICD-10-CM | POA: Diagnosis not present

## 2022-08-07 DIAGNOSIS — Z79899 Other long term (current) drug therapy: Secondary | ICD-10-CM | POA: Diagnosis not present

## 2022-08-07 DIAGNOSIS — I441 Atrioventricular block, second degree: Secondary | ICD-10-CM | POA: Diagnosis not present

## 2022-08-07 DIAGNOSIS — C3412 Malignant neoplasm of upper lobe, left bronchus or lung: Secondary | ICD-10-CM | POA: Insufficient documentation

## 2022-08-07 DIAGNOSIS — Z5111 Encounter for antineoplastic chemotherapy: Secondary | ICD-10-CM | POA: Insufficient documentation

## 2022-08-07 DIAGNOSIS — Z510 Encounter for antineoplastic radiation therapy: Secondary | ICD-10-CM | POA: Insufficient documentation

## 2022-08-07 LAB — CBC WITH DIFFERENTIAL (CANCER CENTER ONLY)
Abs Immature Granulocytes: 0.07 10*3/uL (ref 0.00–0.07)
Basophils Absolute: 0 10*3/uL (ref 0.0–0.1)
Basophils Relative: 1 %
Eosinophils Absolute: 0.1 10*3/uL (ref 0.0–0.5)
Eosinophils Relative: 3 %
HCT: 32.7 % — ABNORMAL LOW (ref 39.0–52.0)
Hemoglobin: 10.7 g/dL — ABNORMAL LOW (ref 13.0–17.0)
Immature Granulocytes: 2 %
Lymphocytes Relative: 8 %
Lymphs Abs: 0.3 10*3/uL — ABNORMAL LOW (ref 0.7–4.0)
MCH: 28.9 pg (ref 26.0–34.0)
MCHC: 32.7 g/dL (ref 30.0–36.0)
MCV: 88.4 fL (ref 80.0–100.0)
Monocytes Absolute: 0.3 10*3/uL (ref 0.1–1.0)
Monocytes Relative: 7 %
Neutro Abs: 3 10*3/uL (ref 1.7–7.7)
Neutrophils Relative %: 79 %
Platelet Count: 160 10*3/uL (ref 150–400)
RBC: 3.7 MIL/uL — ABNORMAL LOW (ref 4.22–5.81)
RDW: 15.6 % — ABNORMAL HIGH (ref 11.5–15.5)
WBC Count: 3.8 10*3/uL — ABNORMAL LOW (ref 4.0–10.5)
nRBC: 0 % (ref 0.0–0.2)

## 2022-08-07 LAB — RAD ONC ARIA SESSION SUMMARY
Course Elapsed Days: 26
Plan Fractions Treated to Date: 19
Plan Prescribed Dose Per Fraction: 2 Gy
Plan Total Fractions Prescribed: 33
Plan Total Prescribed Dose: 66 Gy
Reference Point Dosage Given to Date: 38 Gy
Reference Point Session Dosage Given: 2 Gy
Session Number: 19

## 2022-08-07 LAB — CMP (CANCER CENTER ONLY)
ALT: 13 U/L (ref 0–44)
AST: 13 U/L — ABNORMAL LOW (ref 15–41)
Albumin: 3.1 g/dL — ABNORMAL LOW (ref 3.5–5.0)
Alkaline Phosphatase: 64 U/L (ref 38–126)
Anion gap: 7 (ref 5–15)
BUN: 23 mg/dL (ref 8–23)
CO2: 22 mmol/L (ref 22–32)
Calcium: 8.4 mg/dL — ABNORMAL LOW (ref 8.9–10.3)
Chloride: 108 mmol/L (ref 98–111)
Creatinine: 1.14 mg/dL (ref 0.61–1.24)
GFR, Estimated: >60 mL/min (ref >60)
Glucose, Bld: 286 mg/dL — ABNORMAL HIGH (ref 70–99)
Potassium: 4.7 mmol/L (ref 3.5–5.1)
Sodium: 137 mmol/L (ref 135–145)
Total Bilirubin: 0.4 mg/dL (ref 0.3–1.2)
Total Protein: 6 g/dL — ABNORMAL LOW (ref 6.5–8.1)

## 2022-08-07 MED ORDER — SODIUM CHLORIDE 0.9 % IV SOLN
177.6000 mg | Freq: Once | INTRAVENOUS | Status: AC
  Administered 2022-08-07: 180 mg via INTRAVENOUS
  Filled 2022-08-07: qty 18

## 2022-08-07 MED ORDER — HEPARIN SOD (PORK) LOCK FLUSH 100 UNIT/ML IV SOLN
500.0000 [IU] | Freq: Once | INTRAVENOUS | Status: AC | PRN
  Administered 2022-08-07: 500 [IU]
  Filled 2022-08-07: qty 5

## 2022-08-07 MED ORDER — PALONOSETRON HCL INJECTION 0.25 MG/5ML
0.2500 mg | Freq: Once | INTRAVENOUS | Status: AC
  Administered 2022-08-07: 0.25 mg via INTRAVENOUS
  Filled 2022-08-07: qty 5

## 2022-08-07 MED ORDER — SODIUM CHLORIDE 0.9 % IV SOLN
Freq: Once | INTRAVENOUS | Status: AC
  Filled 2022-08-07: qty 250

## 2022-08-07 MED ORDER — SODIUM CHLORIDE 0.9 % IV SOLN
45.0000 mg/m2 | Freq: Once | INTRAVENOUS | Status: AC
  Administered 2022-08-07: 90 mg via INTRAVENOUS
  Filled 2022-08-07: qty 15

## 2022-08-07 MED ORDER — SODIUM CHLORIDE 0.9 % IV SOLN
10.0000 mg | Freq: Once | INTRAVENOUS | Status: AC
  Administered 2022-08-07: 10 mg via INTRAVENOUS
  Filled 2022-08-07: qty 10

## 2022-08-07 MED ORDER — FAMOTIDINE IN NACL 20-0.9 MG/50ML-% IV SOLN
20.0000 mg | Freq: Once | INTRAVENOUS | Status: AC
  Administered 2022-08-07: 20 mg via INTRAVENOUS
  Filled 2022-08-07: qty 50

## 2022-08-07 MED ORDER — DIPHENHYDRAMINE HCL 50 MG/ML IJ SOLN
50.0000 mg | Freq: Once | INTRAMUSCULAR | Status: AC
  Administered 2022-08-07: 50 mg via INTRAVENOUS
  Filled 2022-08-07: qty 1

## 2022-08-07 NOTE — Addendum Note (Signed)
Addended by: Darrold Span A on: 08/07/2022 10:51 AM   Modules accepted: Orders

## 2022-08-07 NOTE — Patient Instructions (Signed)

## 2022-08-07 NOTE — Progress Notes (Signed)
Hematology/Oncology Consult note [facility]  [phone] [phone]  Patient Care Team: Doreene Nest, NP as PCP - General (Internal Medicine) Debbe Odea, MD as PCP - Cardiology (Cardiology) Duke Salvia, MD as PCP - Electrophysiology (Cardiology) Harle Battiest PA-C as Physician Assistant (Urology) Meryl Dare, MD as Consulting Physician (Gastroenterology) Kathyrn Sheriff, [facility] (Inactive) as Pharmacist (Pharmacist) Glory Buff, RN as Oncology Nurse Navigator Creig Hines, MD as Consulting Physician (Oncology)   Name of the patient: Antonio [name]  [id number]  29-Jan-1946   Date of visit: 08/07/22  Diagnosis- 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 He has been undergoing surveillance CT scans since then. CT chest in April 2024 showed increasing areas of disease with left-sided mediastinal and hilar lymph nodes and soft tissue along the left main bronchus. 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 No residual focal hypermetabolism in the treated area of the left upper lobe. No evidence of distant metastatic disease. He underwent MRI brain without contrast in February 2024 which did not show any evidence of metastatic disease. Patient was seen by Dr. Jayme Cloud and underwent EBUS guided biopsies left paratracheal lymph node was biopsied and consistent with pulmonary adenocarcinoma.   Patient is receiving weekly concurrent CarboTaxol radiation  Interval history-patient seen today accompanied by his significant  other. Overall tolerating treatment well.  Has nausea manageable with Zofran and Compazine.  Has intermittent diarrhea under control with Imodium.  Energy level is poor.  Appetite is good. Denies any hemoptysis.  Patient scheduled for CT abdomen pelvis on July 29 and inquiring if that was the correct order.  ECOG PS- 1 Pain scale- 0 Opioid associated constipation- no  Review of systems- Review of Systems  Constitutional:  Negative for chills, fever, malaise/fatigue and weight loss.  HENT:  Negative for congestion, ear discharge and nosebleeds.   Eyes:  Negative for blurred vision.  Respiratory:  Negative for cough, hemoptysis, sputum production, shortness of breath and wheezing.   Cardiovascular:  Negative for chest pain, palpitations, orthopnea and claudication.  Gastrointestinal:  Negative for abdominal pain, blood in stool, constipation, diarrhea, heartburn, melena, nausea and vomiting.  Genitourinary:  Negative for dysuria, flank pain, frequency, hematuria and urgency.  Musculoskeletal:  Negative for back pain, joint pain and myalgias.  Skin:  Negative for rash.  Neurological:  Negative for dizziness, tingling, focal weakness, seizures, weakness and headaches.  Endo/Heme/Allergies:  Does not bruise/bleed easily.  Psychiatric/Behavioral:  Negative for depression and suicidal ideas. The patient does not have insomnia.       No Known Allergies   Past Medical History:  Diagnosis Date   Adenoma of left adrenal gland    Anemia    Aortic atherosclerosis (HCC)    Arthritis    Atrial fibrillation and flutter (HCC)    a.) CHA2DS2VASc = 7 (age x 2, HTN, CVA x 2, vascular disease history, T2DM);  b.) rate/rhythm maintained without pharmacological intervention; chronic antithrombotic therapy using ASA + clopidogrel   Bilateral carotid artery stenosis    a.) s/p PTA 04/07/2022 --> 90% RICA --> 9x7x30  Exact stent; b.) doppler 03/14/20214: 1-39% RICA, 40-59% LICA   BPH with urinary  obstruction    stable on flomax ([name]   Childhood asthma    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   a.) CT head and MRI brain 04/05/2022: chronic cortical/subcortical RIGHT MCA territory infarct within the RIGHT parietal lobe and RIGHT temporoparietal junction   Coronary atherosclerosis of unspecified type of vessel, native or graft    a.) cCTA 05/22/2021: Ca score 1524 (84th percentile for age/sex match control) --> FFRct: dLAD = 0.77, dLCx = 0.67, RPLA = 0.77, dOM1 = 0.66; b.) LHC 05/23/2021: 20% dRCA, 50% OM1 - med mgmt   Diastolic dysfunction    a.) TTE 04/06/2022: EF 60-65%, mod LVH, mild MR, AoV sclerosis, G1DD   DOE (dyspnea on exertion)    Emphysema of lung (HCC)    Erectile dysfunction    a.) on PDE5i (sildenafil)   Esophageal stricture    Fall    Gastric AVM    Gastritis    GERD (gastroesophageal reflux disease)    Hiatal hernia    Hx of colonic polyps    Hyperlipidemia    Hypertension    Internal hemorrhoids without mention of complication    Ischemic leg    Long term current use of antithrombotics/antiplatelets    a.) on DAPT (ASA + clopidogrel)   Lumbago    Mobitz (type) I (Wenckebach's) atrioventricular block    Non-small cell lung cancer (HCC)    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   a.) PCR (+) 05/29/2018, 04/07/2022   Pneumonia    PSA elevation    now averaging 2's   PVD (peripheral vascular disease) with claudication (HCC)    Sigmoid diverticulosis    Tobacco abuse    Type 2 diabetes mellitus treated with insulin [facility])      Past Surgical History:  Procedure Laterality Date   2D Echo  07/2001   BACK SURGERY     CAROTID PTA/STENT INTERVENTION Right 04/07/2022   Procedure: CAROTID PTA/STENT INTERVENTION;  Surgeon: Renford Dills, MD;  Location: [facility] INVASIVE CV LAB;  Service: Cardiovascular;  Laterality: Right;   COLONOSCOPY     ENDOBRONCHIAL ULTRASOUND Left 06/19/2022   Procedure: ENDOBRONCHIAL ULTRASOUND;  Surgeon: Salena Saner,  MD;  Location: [facility] ORS;  Service: Pulmonary;  Laterality: Left;   ESOPHAGOGASTRODUODENOSCOPY  02/13/2007   gastritis and duodenitis without bleed   ESOPHAGOGASTRODUODENOSCOPY N/A 06/26/2014   Procedure: ESOPHAGOGASTRODUODENOSCOPY (EGD);  Surgeon: Rachael Fee, MD;  Location: [facility] ENDOSCOPY;  Service: Endoscopy;  Laterality: N/A;   ESOPHAGOGASTRODUODENOSCOPY (EGD) WITH PROPOFOL N/A 04/08/2020   Procedure: ESOPHAGOGASTRODUODENOSCOPY (EGD) WITH PROPOFOL;  Surgeon: Meryl Dare, MD;  Location: [facility] ENDOSCOPY;  Service: Endoscopy;  Laterality: N/A;   FLEXIBLE BRONCHOSCOPY Left 06/19/2022   Procedure: FLEXIBLE BRONCHOSCOPY;  Surgeon: Salena Saner, MD;  Location: [facility] ORS;  Service: Pulmonary;  Laterality: Left;   HOT HEMOSTASIS N/A 04/08/2020   Procedure: HOT HEMOSTASIS (ARGON PLASMA COAGULATION/BICAP);  Surgeon: Meryl Dare, MD;  Location: [facility] ENDOSCOPY;  Service: Endoscopy;  Laterality: N/A;   IR IMAGING GUIDED PORT INSERTION  06/30/2022   KNEE ARTHROSCOPY Right 11/1998   LEFT HEART CATH AND CORONARY ANGIOGRAPHY N/A 05/23/2021   Procedure: LEFT HEART CATH AND CORONARY ANGIOGRAPHY;  Surgeon: Orbie Pyo, MD;  Location: [facility] INVASIVE CV LAB;  Service: Cardiovascular;  Laterality: N/A;   LOWER EXTREMITY ANGIOGRAPHY Right 12/27/2017   Procedure: LOWER EXTREMITY ANGIOGRAPHY;  Surgeon: Annice Needy, MD;  Location: [facility] INVASIVE CV  LAB;  Service: Cardiovascular;  Laterality: Right;   LOWER EXTREMITY ANGIOGRAPHY Left 01/10/2018   Procedure: LOWER EXTREMITY ANGIOGRAPHY;  Surgeon: Annice Needy, MD;  Location: [facility] INVASIVE CV LAB;  Service: Cardiovascular;  Laterality: Left;   LOWER EXTREMITY ANGIOGRAPHY Left 05/29/2018   Procedure: LOWER EXTREMITY ANGIOGRAPHY;  Surgeon: Annice Needy, MD;  Location: [facility] INVASIVE CV LAB;  Service: Cardiovascular;  Laterality: Left;   LOWER EXTREMITY ANGIOGRAPHY Left 05/30/2018   Procedure: Lower Extremity Angiography;  Surgeon: Annice Needy, MD;  Location:  [facility] INVASIVE CV LAB;  Service: Cardiovascular;  Laterality: Left;   LP  07/2001   Microwave thermotherapy prostate  08/28/2007   [place]   Persantine cardiolite  04/1999   EF 68%   POLYPECTOMY  12/1993   Stress myoview  08/2004   EF 67%   UPPER GASTROINTESTINAL ENDOSCOPY      Social History   Socioeconomic History   Marital status: Widowed    Spouse name: Armando Reichert   Number of children: 3   Years of education: Not on file   Highest education level: Not on file  Occupational History   Occupation: Retired Music therapist  Tobacco Use   Smoking status: Former    Packs/day: 2.00    Years: 57.00    Additional pack years: 0.00    Total pack years: 114.00    Types: Cigarettes    Start date: 02/07/1964    Quit date: 05/11/2021    Years since quitting: 1.2   Smokeless tobacco: Former    Types: Chew   Tobacco comments:    Quit one year ago  Vaping Use   Vaping Use: Never used  Substance and Sexual Activity   Alcohol use: Yes    Comment: rare   Drug use: No   Sexual activity: Yes  Other Topics Concern   Not on file  Social History Narrative   Married 9/09 wife with moderate memory problems.   3 adult children, 2 grandchildren   Would desire CPR   Social Determinants of Health   Financial Resource Strain: Low Risk  (01/24/2022)   Overall Financial Resource Strain (CARDIA)    Difficulty of Paying Living Expenses: Not hard at all  Food Insecurity: No Food Insecurity (06/26/2022)   Hunger Vital Sign    Worried About Running Out of Food in the Last Year: Never true    Ran Out of Food in the Last Year: Never true  Transportation Needs: No Transportation Needs (06/26/2022)   PRAPARE - Administrator, Civil Service (Medical): No    Lack of Transportation (Non-Medical): No  Physical Activity: Insufficiently Active (01/24/2022)   Exercise Vital Sign    Days of Exercise per Week: 3 days    Minutes of Exercise per Session: 30 min  Stress: No Stress Concern Present (01/24/2022)    [employer] of Occupational Health - Occupational Stress Questionnaire    Feeling of Stress : Not at all  Social Connections: Moderately Isolated (01/24/2022)   Social Connection and Isolation Panel [NHANES]    Frequency of Communication with Friends and Family: More than three times a week    Frequency of Social Gatherings with Friends and Family: Once a week    Attends Religious Services: More than 4 times per year    Active Member of [employer] or Organizations: No    Attends Banker Meetings: Never    Marital Status: Widowed  Intimate Partner Violence: Not At Risk (06/26/2022)   Humiliation, Afraid, Rape, and  Kick questionnaire    Fear of Current or Ex-Partner: No    Emotionally Abused: No    Physically Abused: No    Sexually Abused: No    Family History  Problem Relation Age of Onset   Rheum arthritis Mother    Diabetes Father    Alcohol abuse Paternal Uncle    Alcohol abuse Paternal Uncle    Alcohol abuse Paternal Uncle    Heart disease Neg Hx    Stroke Neg Hx    Cancer Neg Hx    Drug abuse Neg Hx    Depression Neg Hx    Colon cancer Neg Hx    Rectal cancer Neg Hx    Stomach cancer Neg Hx    Kidney cancer Neg Hx    Bladder Cancer Neg Hx    Prostate cancer Neg Hx      Current Outpatient Medications:    acetaminophen (TYLENOL) 500 MG tablet, Take 1,000 mg by mouth every 6 (six) hours. Rapid release, Disp: , Rfl:    atorvastatin (LIPITOR) 20 MG tablet, TAKE 1 TABLET BY MOUTH IN THE EVENING FOR CHOLESTEROL (Patient taking differently: Take 20 mg by mouth in the morning.), Disp: 90 tablet, Rfl: 1   clopidogrel (PLAVIX) 75 MG tablet, Take 1 tablet (75 mg total) by mouth daily., Disp: 90 tablet, Rfl: 3   dexamethasone (DECADRON) 4 MG tablet, Take 2 tablets daily for 2 days, start the day after chemotherapy. Take with food., Disp: 30 tablet, Rfl: 1   ferrous gluconate (FERGON) 324 MG tablet, TAKE 1 TABLET BY MOUTH EVERY DAY WITH BREAKFAST (Patient taking  differently: Take 324 mg by mouth daily with breakfast.), Disp: 30 tablet, Rfl: 0   FIBER PO, Take 1 capsule by mouth in the morning., Disp: , Rfl:    finasteride (PROSCAR) 5 MG tablet, Take 1 tablet (5 mg total) by mouth daily. (Patient taking differently: Take 5 mg by mouth every morning.), Disp: 90 tablet, Rfl: 3   Fluticasone-Umeclidin-Vilant (TRELEGY ELLIPTA) 100-62.5-25 MCG/ACT AEPB, Inhale 1 puff into the lungs daily., Disp: 28 each, Rfl: 11   glipiZIDE (GLUCOTROL) 10 MG tablet, Take 10 mg by mouth daily before breakfast., Disp: , Rfl:    GLUCOSAMINE HCL PO, Take 1 tablet by mouth daily., Disp: , Rfl:    Insulin Glargine (BASAGLAR KWIKPEN) 100 UNIT/ML, Inject 30 Units into the skin every morning., Disp: , Rfl:    lidocaine-prilocaine (EMLA) cream, Apply to affected area once, Disp: 30 g, Rfl: 3   lisinopril (ZESTRIL) 20 MG tablet, Take 1 tablet (20 mg total) by mouth daily. (Patient taking differently: Take 20 mg by mouth daily as needed. Patient reports taking if systolic over 160), Disp: 90 tablet, Rfl: 1   metFORMIN (GLUCOPHAGE) 500 MG tablet, TAKE 2 TABLETS BY MOUTH TWICE A DAY WITH MEAL FOR DIABETES (Patient taking differently: Take 1,000 mg by mouth 2 (two) times daily. TAKE 2 TABLETS BY MOUTH TWICE A DAY WITH MEAL FOR DIABETES), Disp: 360 tablet, Rfl: 2   ondansetron (ZOFRAN) 8 MG tablet, Take 1 tablet (8 mg total) by mouth every 8 (eight) hours as needed for nausea or vomiting. Start on the third day after chemotherapy., Disp: 30 tablet, Rfl: 1   pantoprazole (PROTONIX) 40 MG tablet, TAKE 1 TABLET (40 MG TOTAL) BY MOUTH DAILY. FOR HEARTBURN (Patient taking differently: Take 40 mg by mouth every evening. For heartburn), Disp: 90 tablet, Rfl: 0   prochlorperazine (COMPAZINE) 10 MG tablet, Take 1 tablet (10 mg total) by  mouth every 6 (six) hours as needed for nausea or vomiting., Disp: 30 tablet, Rfl: 1   sildenafil (VIAGRA) 100 MG tablet, Take 1 tablet (100 mg total) by mouth daily as  needed for erectile dysfunction., Disp: 30 tablet, Rfl: 0   tamsulosin (FLOMAX) 0.4 MG CAPS capsule, Take 1 capsule (0.4 mg total) by mouth 2 (two) times daily., Disp: 180 capsule, Rfl: 3   vitamin B-12 (CYANOCOBALAMIN) 1000 MCG tablet, Take 1,000 mcg by mouth in the morning., Disp: , Rfl:   Physical exam:  Vitals:   08/07/22 0939  BP: 136/61  Pulse: 72  Temp: 98.6 F (37 C)  TempSrc: Tympanic  SpO2: 98%  Weight: 181 lb 1.6 oz (82.1 kg)   Physical Exam Cardiovascular:     Rate and Rhythm: Normal rate and regular rhythm.     Heart sounds: Normal heart sounds.  Pulmonary:     Effort: Pulmonary effort is normal.     Comments: Bibasilar crackles Abdominal:     General: Bowel sounds are normal.     Palpations: Abdomen is soft.  Skin:    General: Skin is warm and dry.  Neurological:     Mental Status: He is alert and oriented to person, place, and time.         Latest Ref Rng & Units 08/07/2022    9:25 AM  CMP  Glucose 70 - 99 mg/dL 161   BUN 8 - 23 mg/dL 23   Creatinine 0.96 - 1.24 mg/dL 0.45   Sodium 409 - 811 mmol/L 137   Potassium 3.5 - 5.1 mmol/L 4.7   Chloride 98 - 111 mmol/L 108   CO2 22 - 32 mmol/L 22   Calcium 8.9 - 10.3 mg/dL 8.4   Total Protein 6.5 - 8.1 g/dL 6.0   Total Bilirubin 0.3 - 1.2 mg/dL 0.4   Alkaline Phos 38 - 126 U/L 64   AST 15 - 41 U/L 13   ALT 0 - 44 U/L 13       Latest Ref Rng & Units 08/07/2022    9:25 AM  CBC  WBC 4.0 - 10.5 K/uL 3.8   Hemoglobin 13.0 - 17.0 g/dL 91.4   Hematocrit 78.2 - 52.0 % 32.7   Platelets 150 - 400 K/uL 160     No images are attached to the encounter.  No results found.   Assessment and plan- Patient is a 77 y.o. male with history of stage I non-small cell lung cancer involving the left upper lobe in 2023 now with biopsy-proven mediastinal recurrence.  He is here for on treatment assessment prior to cycle 3 of weekly CarboTaxol chemotherapy  Labs reviewed and acceptable for treatment.  Proceed with cycle 5 of  CarboTaxol.  He is scheduled for infusion and labs only with cycle 6.  And then follow-up with Dr. Smith Robert with cycle 7.  Patient inquiring about CT abdomen pelvis scheduled for July 29.  Patient denies any abdominal symptoms.  Will switch to CT chest with contrast on the same day.  Diabetes-glucose today 286.  He is on insulin regimen.  Follows with endocrinology Dr. Tedd Sias.  Visit Diagnosis 1. Recurrent non-small cell lung cancer (HCC)      08/07/2022 10:03 AM

## 2022-08-07 NOTE — Addendum Note (Signed)
Addended by: Darrold Span A on: 08/07/2022 10:29 AM   Modules accepted: Orders

## 2022-08-08 ENCOUNTER — Encounter: Payer: Self-pay | Admitting: Pulmonary Disease

## 2022-08-08 ENCOUNTER — Ambulatory Visit
Admission: RE | Admit: 2022-08-08 | Discharge: 2022-08-08 | Disposition: A | Discharge status: Home / Self Care | Payer: Medicare Other | Source: Ambulatory Visit | Attending: Radiation Oncology | Admitting: Radiation Oncology

## 2022-08-08 ENCOUNTER — Other Ambulatory Visit: Payer: Self-pay

## 2022-08-08 DIAGNOSIS — Z5111 Encounter for antineoplastic chemotherapy: Secondary | ICD-10-CM | POA: Diagnosis not present

## 2022-08-08 DIAGNOSIS — Z79899 Other long term (current) drug therapy: Secondary | ICD-10-CM | POA: Diagnosis not present

## 2022-08-08 DIAGNOSIS — Z87891 Personal history of nicotine dependence: Secondary | ICD-10-CM | POA: Diagnosis not present

## 2022-08-08 DIAGNOSIS — C3411 Malignant neoplasm of upper lobe, right bronchus or lung: Secondary | ICD-10-CM | POA: Diagnosis not present

## 2022-08-08 DIAGNOSIS — Z510 Encounter for antineoplastic radiation therapy: Secondary | ICD-10-CM | POA: Diagnosis not present

## 2022-08-08 DIAGNOSIS — C3412 Malignant neoplasm of upper lobe, left bronchus or lung: Secondary | ICD-10-CM | POA: Diagnosis not present

## 2022-08-08 LAB — RAD ONC ARIA SESSION SUMMARY
Course Elapsed Days: 27
Plan Fractions Treated to Date: 20
Plan Prescribed Dose Per Fraction: 2 Gy
Plan Total Fractions Prescribed: 33
Plan Total Prescribed Dose: 66 Gy
Reference Point Dosage Given to Date: 40 Gy
Reference Point Session Dosage Given: 2 Gy
Session Number: 20

## 2022-08-09 ENCOUNTER — Ambulatory Visit
Admission: RE | Admit: 2022-08-09 | Discharge: 2022-08-09 | Disposition: A | Discharge status: Home / Self Care | Payer: Medicare Other | Source: Ambulatory Visit | Attending: Radiation Oncology | Admitting: Radiation Oncology

## 2022-08-09 ENCOUNTER — Other Ambulatory Visit: Payer: Self-pay

## 2022-08-09 DIAGNOSIS — Z79899 Other long term (current) drug therapy: Secondary | ICD-10-CM | POA: Diagnosis not present

## 2022-08-09 DIAGNOSIS — C3411 Malignant neoplasm of upper lobe, right bronchus or lung: Secondary | ICD-10-CM | POA: Diagnosis not present

## 2022-08-09 DIAGNOSIS — Z510 Encounter for antineoplastic radiation therapy: Secondary | ICD-10-CM | POA: Diagnosis not present

## 2022-08-09 DIAGNOSIS — C3412 Malignant neoplasm of upper lobe, left bronchus or lung: Secondary | ICD-10-CM | POA: Diagnosis not present

## 2022-08-09 DIAGNOSIS — Z5111 Encounter for antineoplastic chemotherapy: Secondary | ICD-10-CM | POA: Diagnosis not present

## 2022-08-09 DIAGNOSIS — Z87891 Personal history of nicotine dependence: Secondary | ICD-10-CM | POA: Diagnosis not present

## 2022-08-09 LAB — RAD ONC ARIA SESSION SUMMARY
Course Elapsed Days: 28
Plan Fractions Treated to Date: 21
Plan Prescribed Dose Per Fraction: 2 Gy
Plan Total Fractions Prescribed: 33
Plan Total Prescribed Dose: 66 Gy
Reference Point Dosage Given to Date: 42 Gy
Reference Point Session Dosage Given: 2 Gy
Session Number: 21

## 2022-08-11 ENCOUNTER — Ambulatory Visit
Admission: RE | Admit: 2022-08-11 | Discharge: 2022-08-11 | Disposition: A | Discharge status: Home / Self Care | Payer: Medicare Other | Source: Ambulatory Visit | Attending: Radiation Oncology | Admitting: Radiation Oncology

## 2022-08-11 ENCOUNTER — Other Ambulatory Visit: Payer: Self-pay

## 2022-08-11 DIAGNOSIS — Z5111 Encounter for antineoplastic chemotherapy: Secondary | ICD-10-CM | POA: Diagnosis not present

## 2022-08-11 DIAGNOSIS — Z79899 Other long term (current) drug therapy: Secondary | ICD-10-CM | POA: Diagnosis not present

## 2022-08-11 DIAGNOSIS — C3411 Malignant neoplasm of upper lobe, right bronchus or lung: Secondary | ICD-10-CM | POA: Diagnosis not present

## 2022-08-11 DIAGNOSIS — Z510 Encounter for antineoplastic radiation therapy: Secondary | ICD-10-CM | POA: Diagnosis not present

## 2022-08-11 DIAGNOSIS — C3412 Malignant neoplasm of upper lobe, left bronchus or lung: Secondary | ICD-10-CM | POA: Diagnosis not present

## 2022-08-11 DIAGNOSIS — Z87891 Personal history of nicotine dependence: Secondary | ICD-10-CM | POA: Diagnosis not present

## 2022-08-11 LAB — RAD ONC ARIA SESSION SUMMARY
Course Elapsed Days: 30
Plan Fractions Treated to Date: 22
Plan Prescribed Dose Per Fraction: 2 Gy
Plan Total Fractions Prescribed: 33
Plan Total Prescribed Dose: 66 Gy
Reference Point Dosage Given to Date: 44 Gy
Reference Point Session Dosage Given: 2 Gy
Session Number: 22

## 2022-08-11 MED FILL — Dexamethasone Sodium Phosphate Inj 100 MG/10ML: INTRAMUSCULAR | Qty: 1 | Status: AC

## 2022-08-14 ENCOUNTER — Inpatient Hospital Stay: Payer: Medicare Other

## 2022-08-14 ENCOUNTER — Ambulatory Visit
Admission: RE | Admit: 2022-08-14 | Discharge: 2022-08-14 | Disposition: A | Discharge status: Home / Self Care | Payer: Medicare Other | Source: Ambulatory Visit | Attending: Radiation Oncology | Admitting: Radiation Oncology

## 2022-08-14 ENCOUNTER — Other Ambulatory Visit: Payer: Self-pay

## 2022-08-14 VITALS — BP 166/91 | HR 64 | Temp 95.5°F | Resp 18

## 2022-08-14 DIAGNOSIS — Z79899 Other long term (current) drug therapy: Secondary | ICD-10-CM | POA: Diagnosis not present

## 2022-08-14 DIAGNOSIS — C3412 Malignant neoplasm of upper lobe, left bronchus or lung: Secondary | ICD-10-CM | POA: Diagnosis not present

## 2022-08-14 DIAGNOSIS — Z87891 Personal history of nicotine dependence: Secondary | ICD-10-CM | POA: Diagnosis not present

## 2022-08-14 DIAGNOSIS — C3490 Malignant neoplasm of unspecified part of unspecified bronchus or lung: Secondary | ICD-10-CM

## 2022-08-14 DIAGNOSIS — Z5111 Encounter for antineoplastic chemotherapy: Secondary | ICD-10-CM | POA: Diagnosis not present

## 2022-08-14 DIAGNOSIS — C3411 Malignant neoplasm of upper lobe, right bronchus or lung: Secondary | ICD-10-CM | POA: Diagnosis not present

## 2022-08-14 DIAGNOSIS — Z510 Encounter for antineoplastic radiation therapy: Secondary | ICD-10-CM | POA: Diagnosis not present

## 2022-08-14 LAB — RAD ONC ARIA SESSION SUMMARY
Course Elapsed Days: 33
Plan Fractions Treated to Date: 23
Plan Prescribed Dose Per Fraction: 2 Gy
Plan Total Fractions Prescribed: 33
Plan Total Prescribed Dose: 66 Gy
Reference Point Dosage Given to Date: 46 Gy
Reference Point Session Dosage Given: 2 Gy
Session Number: 23

## 2022-08-14 LAB — CMP (CANCER CENTER ONLY)
ALT: 11 U/L (ref 0–44)
AST: 13 U/L — ABNORMAL LOW (ref 15–41)
Albumin: 3.2 g/dL — ABNORMAL LOW (ref 3.5–5.0)
Alkaline Phosphatase: 60 U/L (ref 38–126)
Anion gap: 6 (ref 5–15)
BUN: 20 mg/dL (ref 8–23)
CO2: 21 mmol/L — ABNORMAL LOW (ref 22–32)
Calcium: 8.5 mg/dL — ABNORMAL LOW (ref 8.9–10.3)
Chloride: 110 mmol/L (ref 98–111)
Creatinine: 0.9 mg/dL (ref 0.61–1.24)
GFR, Estimated: >60 mL/min (ref >60)
Glucose, Bld: 240 mg/dL — ABNORMAL HIGH (ref 70–99)
Potassium: 4.8 mmol/L (ref 3.5–5.1)
Sodium: 137 mmol/L (ref 135–145)
Total Bilirubin: 0.3 mg/dL (ref 0.3–1.2)
Total Protein: 5.9 g/dL — ABNORMAL LOW (ref 6.5–8.1)

## 2022-08-14 LAB — CBC WITH DIFFERENTIAL (CANCER CENTER ONLY)
Abs Immature Granulocytes: 0.06 10*3/uL (ref 0.00–0.07)
Basophils Absolute: 0 10*3/uL (ref 0.0–0.1)
Basophils Relative: 1 %
Eosinophils Absolute: 0.1 10*3/uL (ref 0.0–0.5)
Eosinophils Relative: 2 %
HCT: 32.3 % — ABNORMAL LOW (ref 39.0–52.0)
Hemoglobin: 10.6 g/dL — ABNORMAL LOW (ref 13.0–17.0)
Immature Granulocytes: 2 %
Lymphocytes Relative: 11 %
Lymphs Abs: 0.3 10*3/uL — ABNORMAL LOW (ref 0.7–4.0)
MCH: 29 pg (ref 26.0–34.0)
MCHC: 32.8 g/dL (ref 30.0–36.0)
MCV: 88.5 fL (ref 80.0–100.0)
Monocytes Absolute: 0.2 10*3/uL (ref 0.1–1.0)
Monocytes Relative: 7 %
Neutro Abs: 2 10*3/uL (ref 1.7–7.7)
Neutrophils Relative %: 77 %
Platelet Count: 190 10*3/uL (ref 150–400)
RBC: 3.65 MIL/uL — ABNORMAL LOW (ref 4.22–5.81)
RDW: 16.2 % — ABNORMAL HIGH (ref 11.5–15.5)
WBC Count: 2.6 10*3/uL — ABNORMAL LOW (ref 4.0–10.5)
nRBC: 0 % (ref 0.0–0.2)

## 2022-08-14 MED ORDER — HEPARIN SOD (PORK) LOCK FLUSH 100 UNIT/ML IV SOLN
500.0000 [IU] | Freq: Once | INTRAVENOUS | Status: AC | PRN
  Administered 2022-08-14: 500 [IU]
  Filled 2022-08-14: qty 5

## 2022-08-14 MED ORDER — SODIUM CHLORIDE 0.9 % IV SOLN
Freq: Once | INTRAVENOUS | Status: AC
  Filled 2022-08-14: qty 250

## 2022-08-14 MED ORDER — PALONOSETRON HCL INJECTION 0.25 MG/5ML
0.2500 mg | Freq: Once | INTRAVENOUS | Status: AC
  Administered 2022-08-14: 0.25 mg via INTRAVENOUS
  Filled 2022-08-14: qty 5

## 2022-08-14 MED ORDER — SODIUM CHLORIDE 0.9% FLUSH
10.0000 mL | Freq: Once | INTRAVENOUS | Status: AC
  Administered 2022-08-14: 10 mL via INTRAVENOUS
  Filled 2022-08-14: qty 10

## 2022-08-14 MED ORDER — DIPHENHYDRAMINE HCL 50 MG/ML IJ SOLN
50.0000 mg | Freq: Once | INTRAMUSCULAR | Status: AC
  Administered 2022-08-14: 50 mg via INTRAVENOUS
  Filled 2022-08-14: qty 1

## 2022-08-14 MED ORDER — SODIUM CHLORIDE 0.9 % IV SOLN
10.0000 mg | Freq: Once | INTRAVENOUS | Status: AC
  Administered 2022-08-14: 10 mg via INTRAVENOUS
  Filled 2022-08-14: qty 10

## 2022-08-14 MED ORDER — FAMOTIDINE IN NACL 20-0.9 MG/50ML-% IV SOLN
20.0000 mg | Freq: Once | INTRAVENOUS | Status: AC
  Administered 2022-08-14: 20 mg via INTRAVENOUS
  Filled 2022-08-14: qty 50

## 2022-08-14 MED ORDER — SODIUM CHLORIDE 0.9 % IV SOLN
195.4000 mg | Freq: Once | INTRAVENOUS | Status: AC
  Administered 2022-08-14: 200 mg via INTRAVENOUS
  Filled 2022-08-14: qty 20

## 2022-08-14 MED ORDER — SODIUM CHLORIDE 0.9 % IV SOLN
45.0000 mg/m2 | Freq: Once | INTRAVENOUS | Status: AC
  Administered 2022-08-14: 90 mg via INTRAVENOUS
  Filled 2022-08-14: qty 15

## 2022-08-15 ENCOUNTER — Other Ambulatory Visit: Payer: Self-pay

## 2022-08-15 ENCOUNTER — Ambulatory Visit: Admission: RE | Admit: 2022-08-15 | Payer: Medicare Other | Source: Ambulatory Visit

## 2022-08-15 ENCOUNTER — Inpatient Hospital Stay: Payer: Medicare Other

## 2022-08-15 DIAGNOSIS — Z87891 Personal history of nicotine dependence: Secondary | ICD-10-CM | POA: Diagnosis not present

## 2022-08-15 DIAGNOSIS — Z510 Encounter for antineoplastic radiation therapy: Secondary | ICD-10-CM | POA: Diagnosis not present

## 2022-08-15 DIAGNOSIS — Z5111 Encounter for antineoplastic chemotherapy: Secondary | ICD-10-CM | POA: Diagnosis not present

## 2022-08-15 DIAGNOSIS — Z79899 Other long term (current) drug therapy: Secondary | ICD-10-CM | POA: Diagnosis not present

## 2022-08-15 DIAGNOSIS — C3412 Malignant neoplasm of upper lobe, left bronchus or lung: Secondary | ICD-10-CM | POA: Diagnosis not present

## 2022-08-15 DIAGNOSIS — C3411 Malignant neoplasm of upper lobe, right bronchus or lung: Secondary | ICD-10-CM | POA: Diagnosis not present

## 2022-08-15 LAB — RAD ONC ARIA SESSION SUMMARY
Course Elapsed Days: 34
Plan Fractions Treated to Date: 24
Plan Prescribed Dose Per Fraction: 2 Gy
Plan Total Fractions Prescribed: 33
Plan Total Prescribed Dose: 66 Gy
Reference Point Dosage Given to Date: 48 Gy
Reference Point Session Dosage Given: 2 Gy
Session Number: 24

## 2022-08-15 NOTE — Progress Notes (Signed)
Nutrition Follow-up:  Patient with stage I non small cell lung cancer with mediastinal recurrence.  Patient receiving concurrent chemotherapy (last 7/15) and radiation (last treatment 7/22).    Met with patient following radiation for nutrition follow-up.  Patient reports that appetite is good but sleeping is terrible.  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  Says that he tried the glucerna shakes and likes them.  Wanting to purchase more but did not have any at [employer] when he went to the store.  Says that yesterday he ate cereal for breakfast.  Lunch was a tomato sandwich and dinner last night was corn with bell pepper and pimento cheese.  This morning ate bacon with blueberry pancakes for breakfast and tomato sandwich.  Likes eggs and has them about 3 times a week along with fruit and cottage cheese.  Denies any nutrition impact symptoms.      Medications: protonix  Labs: reviewed  Anthropometrics:   Weight 182 lb 15.7 oz on 7/8  180 lb 6.4 oz on 6/25 184 lb 4 oz on 4/15   NUTRITION DIAGNOSIS: Unintentional weight loss improved    INTERVENTION:  Continue glucerna shakes for added calories and protein Continue foods high in protein. Contact information provided to patient and will notify RD if nutrition/appetite changes    NEXT VISIT: no follow-up Patient to contact RD if needed   Ayham Word B. Freida Busman, RD, LDN Registered Dietitian [phone]

## 2022-08-16 ENCOUNTER — Ambulatory Visit
Admission: RE | Admit: 2022-08-16 | Discharge: 2022-08-16 | Disposition: A | Discharge status: Home / Self Care | Payer: Medicare Other | Source: Ambulatory Visit | Attending: Radiation Oncology | Admitting: Radiation Oncology

## 2022-08-16 ENCOUNTER — Other Ambulatory Visit: Payer: Self-pay

## 2022-08-16 DIAGNOSIS — C3411 Malignant neoplasm of upper lobe, right bronchus or lung: Secondary | ICD-10-CM | POA: Diagnosis not present

## 2022-08-16 DIAGNOSIS — C3412 Malignant neoplasm of upper lobe, left bronchus or lung: Secondary | ICD-10-CM | POA: Diagnosis not present

## 2022-08-16 DIAGNOSIS — Z79899 Other long term (current) drug therapy: Secondary | ICD-10-CM | POA: Diagnosis not present

## 2022-08-16 DIAGNOSIS — Z510 Encounter for antineoplastic radiation therapy: Secondary | ICD-10-CM | POA: Diagnosis not present

## 2022-08-16 DIAGNOSIS — Z87891 Personal history of nicotine dependence: Secondary | ICD-10-CM | POA: Diagnosis not present

## 2022-08-16 DIAGNOSIS — Z5111 Encounter for antineoplastic chemotherapy: Secondary | ICD-10-CM | POA: Diagnosis not present

## 2022-08-16 LAB — RAD ONC ARIA SESSION SUMMARY
Course Elapsed Days: 35
Plan Fractions Treated to Date: 25
Plan Prescribed Dose Per Fraction: 2 Gy
Plan Total Fractions Prescribed: 33
Plan Total Prescribed Dose: 66 Gy
Reference Point Dosage Given to Date: 50 Gy
Reference Point Session Dosage Given: 2 Gy
Session Number: 25

## 2022-08-17 ENCOUNTER — Other Ambulatory Visit: Payer: Self-pay

## 2022-08-17 ENCOUNTER — Ambulatory Visit
Admission: RE | Admit: 2022-08-17 | Discharge: 2022-08-17 | Disposition: A | Discharge status: Home / Self Care | Payer: Medicare Other | Source: Ambulatory Visit | Attending: Radiation Oncology | Admitting: Radiation Oncology

## 2022-08-17 DIAGNOSIS — Z79899 Other long term (current) drug therapy: Secondary | ICD-10-CM | POA: Diagnosis not present

## 2022-08-17 DIAGNOSIS — Z87891 Personal history of nicotine dependence: Secondary | ICD-10-CM | POA: Diagnosis not present

## 2022-08-17 DIAGNOSIS — C3411 Malignant neoplasm of upper lobe, right bronchus or lung: Secondary | ICD-10-CM | POA: Diagnosis not present

## 2022-08-17 DIAGNOSIS — Z510 Encounter for antineoplastic radiation therapy: Secondary | ICD-10-CM | POA: Diagnosis not present

## 2022-08-17 DIAGNOSIS — Z5111 Encounter for antineoplastic chemotherapy: Secondary | ICD-10-CM | POA: Diagnosis not present

## 2022-08-17 DIAGNOSIS — C3412 Malignant neoplasm of upper lobe, left bronchus or lung: Secondary | ICD-10-CM | POA: Diagnosis not present

## 2022-08-17 LAB — RAD ONC ARIA SESSION SUMMARY
Course Elapsed Days: 36
Plan Fractions Treated to Date: 26
Plan Prescribed Dose Per Fraction: 2 Gy
Plan Total Fractions Prescribed: 33
Plan Total Prescribed Dose: 66 Gy
Reference Point Dosage Given to Date: 52 Gy
Reference Point Session Dosage Given: 2 Gy
Session Number: 26

## 2022-08-18 ENCOUNTER — Ambulatory Visit
Admission: RE | Admit: 2022-08-18 | Discharge: 2022-08-18 | Disposition: A | Discharge status: Home / Self Care | Payer: Medicare Other | Source: Ambulatory Visit | Attending: Radiation Oncology | Admitting: Radiation Oncology

## 2022-08-18 ENCOUNTER — Other Ambulatory Visit: Payer: Self-pay

## 2022-08-18 ENCOUNTER — Ambulatory Visit: Payer: Medicare Other | Admitting: Podiatry

## 2022-08-18 DIAGNOSIS — Z79899 Other long term (current) drug therapy: Secondary | ICD-10-CM | POA: Diagnosis not present

## 2022-08-18 DIAGNOSIS — Z510 Encounter for antineoplastic radiation therapy: Secondary | ICD-10-CM | POA: Diagnosis not present

## 2022-08-18 DIAGNOSIS — C3411 Malignant neoplasm of upper lobe, right bronchus or lung: Secondary | ICD-10-CM | POA: Diagnosis not present

## 2022-08-18 DIAGNOSIS — Z5111 Encounter for antineoplastic chemotherapy: Secondary | ICD-10-CM | POA: Diagnosis not present

## 2022-08-18 DIAGNOSIS — C3412 Malignant neoplasm of upper lobe, left bronchus or lung: Secondary | ICD-10-CM | POA: Diagnosis not present

## 2022-08-18 DIAGNOSIS — Z87891 Personal history of nicotine dependence: Secondary | ICD-10-CM | POA: Diagnosis not present

## 2022-08-18 LAB — RAD ONC ARIA SESSION SUMMARY
Course Elapsed Days: 37
Plan Fractions Treated to Date: 27
Plan Prescribed Dose Per Fraction: 2 Gy
Plan Total Fractions Prescribed: 33
Plan Total Prescribed Dose: 66 Gy
Reference Point Dosage Given to Date: 54 Gy
Reference Point Session Dosage Given: 2 Gy
Session Number: 27

## 2022-08-18 MED FILL — Dexamethasone Sodium Phosphate Inj 100 MG/10ML: INTRAMUSCULAR | Qty: 1 | Status: AC

## 2022-08-21 ENCOUNTER — Other Ambulatory Visit: Payer: Self-pay

## 2022-08-21 ENCOUNTER — Inpatient Hospital Stay: Payer: Medicare Other

## 2022-08-21 ENCOUNTER — Encounter: Payer: Self-pay | Admitting: Oncology

## 2022-08-21 ENCOUNTER — Ambulatory Visit
Admission: RE | Admit: 2022-08-21 | Discharge: 2022-08-21 | Disposition: A | Discharge status: Home / Self Care | Payer: Medicare Other | Source: Ambulatory Visit | Attending: Radiation Oncology | Admitting: Radiation Oncology

## 2022-08-21 ENCOUNTER — Inpatient Hospital Stay (HOSPITAL_BASED_OUTPATIENT_CLINIC_OR_DEPARTMENT_OTHER): Payer: Medicare Other | Admitting: Oncology

## 2022-08-21 VITALS — BP 115/57 | HR 88 | Temp 97.1°F | Resp 18 | Ht 67.0 in | Wt 180.1 lb

## 2022-08-21 VITALS — BP 154/94 | HR 66 | Resp 16

## 2022-08-21 DIAGNOSIS — Z79899 Other long term (current) drug therapy: Secondary | ICD-10-CM | POA: Diagnosis not present

## 2022-08-21 DIAGNOSIS — C3490 Malignant neoplasm of unspecified part of unspecified bronchus or lung: Secondary | ICD-10-CM | POA: Diagnosis not present

## 2022-08-21 DIAGNOSIS — N179 Acute kidney failure, unspecified: Secondary | ICD-10-CM | POA: Diagnosis not present

## 2022-08-21 DIAGNOSIS — C3411 Malignant neoplasm of upper lobe, right bronchus or lung: Secondary | ICD-10-CM | POA: Diagnosis not present

## 2022-08-21 DIAGNOSIS — Z5111 Encounter for antineoplastic chemotherapy: Secondary | ICD-10-CM

## 2022-08-21 DIAGNOSIS — C3412 Malignant neoplasm of upper lobe, left bronchus or lung: Secondary | ICD-10-CM | POA: Diagnosis not present

## 2022-08-21 DIAGNOSIS — R7989 Other specified abnormal findings of blood chemistry: Secondary | ICD-10-CM

## 2022-08-21 DIAGNOSIS — Z510 Encounter for antineoplastic radiation therapy: Secondary | ICD-10-CM | POA: Diagnosis not present

## 2022-08-21 DIAGNOSIS — Z87891 Personal history of nicotine dependence: Secondary | ICD-10-CM | POA: Diagnosis not present

## 2022-08-21 LAB — CBC WITH DIFFERENTIAL (CANCER CENTER ONLY)
Abs Immature Granulocytes: 0.05 10*3/uL (ref 0.00–0.07)
Basophils Absolute: 0 10*3/uL (ref 0.0–0.1)
Basophils Relative: 1 %
Eosinophils Absolute: 0.1 10*3/uL (ref 0.0–0.5)
Eosinophils Relative: 2 %
HCT: 32.5 % — ABNORMAL LOW (ref 39.0–52.0)
Hemoglobin: 10.8 g/dL — ABNORMAL LOW (ref 13.0–17.0)
Immature Granulocytes: 2 %
Lymphocytes Relative: 9 %
Lymphs Abs: 0.2 10*3/uL — ABNORMAL LOW (ref 0.7–4.0)
MCH: 29.5 pg (ref 26.0–34.0)
MCHC: 33.2 g/dL (ref 30.0–36.0)
MCV: 88.8 fL (ref 80.0–100.0)
Monocytes Absolute: 0.2 10*3/uL (ref 0.1–1.0)
Monocytes Relative: 9 %
Neutro Abs: 2 10*3/uL (ref 1.7–7.7)
Neutrophils Relative %: 77 %
Platelet Count: 234 10*3/uL (ref 150–400)
RBC: 3.66 MIL/uL — ABNORMAL LOW (ref 4.22–5.81)
RDW: 16.5 % — ABNORMAL HIGH (ref 11.5–15.5)
WBC Count: 2.6 10*3/uL — ABNORMAL LOW (ref 4.0–10.5)
nRBC: 0 % (ref 0.0–0.2)

## 2022-08-21 LAB — CMP (CANCER CENTER ONLY)
ALT: 11 U/L (ref 0–44)
AST: 13 U/L — ABNORMAL LOW (ref 15–41)
Albumin: 3 g/dL — ABNORMAL LOW (ref 3.5–5.0)
Alkaline Phosphatase: 68 U/L (ref 38–126)
Anion gap: 8 (ref 5–15)
BUN: 30 mg/dL — ABNORMAL HIGH (ref 8–23)
CO2: 19 mmol/L — ABNORMAL LOW (ref 22–32)
Calcium: 8.1 mg/dL — ABNORMAL LOW (ref 8.9–10.3)
Chloride: 110 mmol/L (ref 98–111)
Creatinine: 1.27 mg/dL — ABNORMAL HIGH (ref 0.61–1.24)
GFR, Estimated: 58 mL/min — ABNORMAL LOW (ref >60)
Glucose, Bld: 295 mg/dL — ABNORMAL HIGH (ref 70–99)
Potassium: 4.4 mmol/L (ref 3.5–5.1)
Sodium: 137 mmol/L (ref 135–145)
Total Bilirubin: 0.2 mg/dL — ABNORMAL LOW (ref 0.3–1.2)
Total Protein: 5.9 g/dL — ABNORMAL LOW (ref 6.5–8.1)

## 2022-08-21 LAB — RAD ONC ARIA SESSION SUMMARY
Course Elapsed Days: 40
Plan Fractions Treated to Date: 28
Plan Prescribed Dose Per Fraction: 2 Gy
Plan Total Fractions Prescribed: 33
Plan Total Prescribed Dose: 66 Gy
Reference Point Dosage Given to Date: 56 Gy
Reference Point Session Dosage Given: 2 Gy
Session Number: 28

## 2022-08-21 MED ORDER — HEPARIN SOD (PORK) LOCK FLUSH 100 UNIT/ML IV SOLN
500.0000 [IU] | Freq: Once | INTRAVENOUS | Status: AC | PRN
  Administered 2022-08-21: 500 [IU]
  Filled 2022-08-21: qty 5

## 2022-08-21 MED ORDER — SODIUM CHLORIDE 0.9 % IV SOLN
10.0000 mg | Freq: Once | INTRAVENOUS | Status: AC
  Administered 2022-08-21: 10 mg via INTRAVENOUS
  Filled 2022-08-21: qty 10

## 2022-08-21 MED ORDER — SODIUM CHLORIDE 0.9 % IV SOLN
Freq: Once | INTRAVENOUS | Status: AC
  Filled 2022-08-21: qty 250

## 2022-08-21 MED ORDER — DIPHENHYDRAMINE HCL 50 MG/ML IJ SOLN
50.0000 mg | Freq: Once | INTRAMUSCULAR | Status: AC
  Administered 2022-08-21: 50 mg via INTRAVENOUS
  Filled 2022-08-21: qty 1

## 2022-08-21 MED ORDER — FAMOTIDINE IN NACL 20-0.9 MG/50ML-% IV SOLN
20.0000 mg | Freq: Once | INTRAVENOUS | Status: AC
  Administered 2022-08-21: 20 mg via INTRAVENOUS
  Filled 2022-08-21: qty 50

## 2022-08-21 MED ORDER — SODIUM CHLORIDE 0.9 % IV SOLN
45.0000 mg/m2 | Freq: Once | INTRAVENOUS | Status: AC
  Administered 2022-08-21: 90 mg via INTRAVENOUS
  Filled 2022-08-21: qty 15

## 2022-08-21 MED ORDER — PALONOSETRON HCL INJECTION 0.25 MG/5ML
0.2500 mg | Freq: Once | INTRAVENOUS | Status: AC
  Administered 2022-08-21: 0.25 mg via INTRAVENOUS
  Filled 2022-08-21: qty 5

## 2022-08-21 MED ORDER — SODIUM CHLORIDE 0.9 % IV SOLN
164.6000 mg | Freq: Once | INTRAVENOUS | Status: AC
  Administered 2022-08-21: 160 mg via INTRAVENOUS
  Filled 2022-08-21: qty 1.92

## 2022-08-21 NOTE — Patient Instructions (Addendum)

## 2022-08-21 NOTE — Progress Notes (Signed)
Hematology/Oncology Consult note [facility]  Telephone:([phone] [phone]  Patient Care Team: Doreene Nest, NP as PCP - General (Internal Medicine) Debbe Odea, MD as PCP - Cardiology (Cardiology) Duke Salvia, MD as PCP - Electrophysiology (Cardiology) Harle Battiest PA-C as Physician Assistant (Urology) Meryl Dare, MD as Consulting Physician (Gastroenterology) Kathyrn Sheriff, [facility] (Inactive) as Pharmacist (Pharmacist) Glory Buff, RN as Oncology Nurse Navigator Creig Hines, MD as Consulting Physician (Oncology)   Name of the patient: Antonio [name]  [id number]  1945/02/17   Date of visit: 08/21/22  Diagnosis-  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 He has been undergoing surveillance CT scans since then. CT chest in April 2024 showed increasing areas of disease with left-sided mediastinal and hilar lymph nodes and soft tissue along the left main bronchus. 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 No residual focal hypermetabolism in the treated area of the left upper lobe. No evidence of distant metastatic disease. He underwent MRI brain without contrast in February 2024 which did not show any evidence of metastatic disease. Patient was seen by Dr. Jayme Cloud and underwent EBUS guided biopsies left paratracheal lymph node was biopsied and consistent with pulmonary adenocarcinoma.    Patient is receiving weekly concurrent CarboTaxol radiation  Interval history-patient has been tolerating treatments well so  far.  Today he reports feeling nauseous and had an episode of vomiting before he came to the treatment.  Reports ongoing fatigue.  He is trying to keep up with his oral intake.  ECOG PS- 1 Pain scale- 0   Review of systems- Review of Systems  Constitutional:  Positive for malaise/fatigue. Negative for chills, fever and weight loss.  HENT:  Negative for congestion, ear discharge and nosebleeds.   Eyes:  Negative for blurred vision.  Respiratory:  Negative for cough, hemoptysis, sputum production, shortness of breath and wheezing.   Cardiovascular:  Negative for chest pain, palpitations, orthopnea and claudication.  Gastrointestinal:  Positive for nausea. Negative for abdominal pain, blood in stool, constipation, diarrhea, heartburn, melena and vomiting.  Genitourinary:  Negative for dysuria, flank pain, frequency, hematuria and urgency.  Musculoskeletal:  Negative for back pain, joint pain and myalgias.  Skin:  Negative for rash.  Neurological:  Negative for dizziness, tingling, focal weakness, seizures, weakness and headaches.  Endo/Heme/Allergies:  Does not bruise/bleed easily.  Psychiatric/Behavioral:  Negative for depression and suicidal ideas. The patient does not have insomnia.       No Known Allergies   Past Medical History:  Diagnosis Date   Adenoma of left adrenal gland    Anemia    Aortic atherosclerosis (HCC)    Arthritis    Atrial fibrillation and flutter (HCC)    a.) CHA2DS2VASc = 7 (age x 2, HTN, CVA x 2, vascular disease history, T2DM);  b.) rate/rhythm maintained without pharmacological intervention; chronic antithrombotic therapy using ASA + clopidogrel   Bilateral carotid artery stenosis    a.) s/p PTA 04/07/2022 --> 90% RICA --> 9x7x30 Exact stent; b.) doppler 03/14/20214: 1-39% RICA, 40-59% LICA   BPH with  urinary obstruction    stable on flomax ([name]   Childhood asthma    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   a.) CT head and MRI brain  04/05/2022: chronic cortical/subcortical RIGHT MCA territory infarct within the RIGHT parietal lobe and RIGHT temporoparietal junction   Coronary atherosclerosis of unspecified type of vessel, native or graft    a.) cCTA 05/22/2021: Ca score 1524 (84th percentile for age/sex match control) --> FFRct: dLAD = 0.77, dLCx = 0.67, RPLA = 0.77, dOM1 = 0.66; b.) LHC 05/23/2021: 20% dRCA, 50% OM1 - med mgmt   Diastolic dysfunction    a.) TTE 04/06/2022: EF 60-65%, mod LVH, mild MR, AoV sclerosis, G1DD   DOE (dyspnea on exertion)    Emphysema of lung (HCC)    Erectile dysfunction    a.) on PDE5i (sildenafil)   Esophageal stricture    Fall    Gastric AVM    Gastritis    GERD (gastroesophageal reflux disease)    Hiatal hernia    Hx of colonic polyps    Hyperlipidemia    Hypertension    Internal hemorrhoids without mention of complication    Ischemic leg    Long term current use of antithrombotics/antiplatelets    a.) on DAPT (ASA + clopidogrel)   Lumbago    Mobitz (type) I (Wenckebach's) atrioventricular block    Non-small cell lung cancer (HCC)    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   a.) PCR (+) 05/29/2018, 04/07/2022   Pneumonia    PSA elevation    now averaging 2's   PVD (peripheral vascular disease) with claudication (HCC)    Sigmoid diverticulosis    Tobacco abuse    Type 2 diabetes mellitus treated with insulin [facility])      Past Surgical History:  Procedure Laterality Date   2D Echo  07/2001   BACK SURGERY     CAROTID PTA/STENT INTERVENTION Right 04/07/2022   Procedure: CAROTID PTA/STENT INTERVENTION;  Surgeon: Renford Dills, MD;  Location: [facility] INVASIVE CV LAB;  Service: Cardiovascular;  Laterality: Right;   COLONOSCOPY     ENDOBRONCHIAL ULTRASOUND Left 06/19/2022   Procedure: ENDOBRONCHIAL ULTRASOUND;  Surgeon: Salena Saner, MD;  Location: [facility] ORS;  Service: Pulmonary;  Laterality: Left;   ESOPHAGOGASTRODUODENOSCOPY  02/13/2007   gastritis and duodenitis without  bleed   ESOPHAGOGASTRODUODENOSCOPY N/A 06/26/2014   Procedure: ESOPHAGOGASTRODUODENOSCOPY (EGD);  Surgeon: Rachael Fee, MD;  Location: [facility] ENDOSCOPY;  Service: Endoscopy;  Laterality: N/A;   ESOPHAGOGASTRODUODENOSCOPY (EGD) WITH PROPOFOL N/A 04/08/2020   Procedure: ESOPHAGOGASTRODUODENOSCOPY (EGD) WITH PROPOFOL;  Surgeon: Meryl Dare, MD;  Location: [facility] ENDOSCOPY;  Service: Endoscopy;  Laterality: N/A;   FLEXIBLE BRONCHOSCOPY Left 06/19/2022   Procedure: FLEXIBLE BRONCHOSCOPY;  Surgeon: Salena Saner, MD;  Location: [facility] ORS;  Service: Pulmonary;  Laterality: Left;   HOT HEMOSTASIS N/A 04/08/2020   Procedure: HOT HEMOSTASIS (ARGON PLASMA COAGULATION/BICAP);  Surgeon: Meryl Dare, MD;  Location: [facility] ENDOSCOPY;  Service: Endoscopy;  Laterality: N/A;   IR IMAGING GUIDED PORT INSERTION  06/30/2022   KNEE ARTHROSCOPY Right 11/1998   LEFT HEART CATH AND CORONARY ANGIOGRAPHY N/A 05/23/2021   Procedure: LEFT HEART CATH AND CORONARY ANGIOGRAPHY;  Surgeon: Orbie Pyo, MD;  Location: [facility] INVASIVE CV LAB;  Service: Cardiovascular;  Laterality: N/A;   LOWER EXTREMITY ANGIOGRAPHY Right 12/27/2017   Procedure: LOWER EXTREMITY ANGIOGRAPHY;  Surgeon: Annice Needy, MD;  Location: [facility] INVASIVE CV LAB;  Service: Cardiovascular;  Laterality: Right;   LOWER EXTREMITY ANGIOGRAPHY Left  01/10/2018   Procedure: LOWER EXTREMITY ANGIOGRAPHY;  Surgeon: Annice Needy, MD;  Location: [facility] INVASIVE CV LAB;  Service: Cardiovascular;  Laterality: Left;   LOWER EXTREMITY ANGIOGRAPHY Left 05/29/2018   Procedure: LOWER EXTREMITY ANGIOGRAPHY;  Surgeon: Annice Needy, MD;  Location: [facility] INVASIVE CV LAB;  Service: Cardiovascular;  Laterality: Left;   LOWER EXTREMITY ANGIOGRAPHY Left 05/30/2018   Procedure: Lower Extremity Angiography;  Surgeon: Annice Needy, MD;  Location: [facility] INVASIVE CV LAB;  Service: Cardiovascular;  Laterality: Left;   LP  07/2001   Microwave thermotherapy prostate  08/28/2007   Wolfe    Persantine cardiolite  04/1999   EF 68%   POLYPECTOMY  12/1993   Stress myoview  08/2004   EF 67%   UPPER GASTROINTESTINAL ENDOSCOPY      Social History   Socioeconomic History   Marital status: Widowed    Spouse name: Armando Reichert   Number of children: 3   Years of education: Not on file   Highest education level: Not on file  Occupational History   Occupation: Retired Music therapist  Tobacco Use   Smoking status: Former    Current packs/day: 0.00    Average packs/day: 2.0 packs/day for 57.3 years (114.5 ttl pk-yrs)    Types: Cigarettes    Start date: 02/07/1964    Quit date: 05/11/2021    Years since quitting: 1.2   Smokeless tobacco: Former    Types: Chew   Tobacco comments:    Quit one year ago  Vaping Use   Vaping status: Never Used  Substance and Sexual Activity   Alcohol use: Yes    Comment: rare   Drug use: No   Sexual activity: Yes  Other Topics Concern   Not on file  Social History Narrative   Married 9/09 wife with moderate memory problems.   3 adult children, 2 grandchildren   Would desire CPR   Social Determinants of Health   Financial Resource Strain: Low Risk  (01/24/2022)   Overall Financial Resource Strain (CARDIA)    Difficulty of Paying Living Expenses: Not hard at all  Food Insecurity: No Food Insecurity (06/26/2022)   Hunger Vital Sign    Worried About Running Out of Food in the Last Year: Never true    Ran Out of Food in the Last Year: Never true  Transportation Needs: No Transportation Needs (06/26/2022)   PRAPARE - Administrator, Civil Service (Medical): No    Lack of Transportation (Non-Medical): No  Physical Activity: Insufficiently Active (01/24/2022)   Exercise Vital Sign    Days of Exercise per Week: 3 days    Minutes of Exercise per Session: 30 min  Stress: No Stress Concern Present (01/24/2022)   [employer] of Occupational Health - Occupational Stress Questionnaire    Feeling of Stress : Not at all  Social Connections:  Moderately Isolated (01/24/2022)   Social Connection and Isolation Panel [NHANES]    Frequency of Communication with Friends and Family: More than three times a week    Frequency of Social Gatherings with Friends and Family: Once a week    Attends Religious Services: More than 4 times per year    Active Member of [employer] or Organizations: No    Attends Banker Meetings: Never    Marital Status: Widowed  Intimate Partner Violence: Not At Risk (06/26/2022)   Humiliation, Afraid, Rape, and Kick questionnaire    Fear of Current or Ex-Partner: No    Emotionally Abused: No  Physically Abused: No    Sexually Abused: No    Family History  Problem Relation Age of Onset   Rheum arthritis Mother    Diabetes Father    Alcohol abuse Paternal Uncle    Alcohol abuse Paternal Uncle    Alcohol abuse Paternal Uncle    Heart disease Neg Hx    Stroke Neg Hx    Cancer Neg Hx    Drug abuse Neg Hx    Depression Neg Hx    Colon cancer Neg Hx    Rectal cancer Neg Hx    Stomach cancer Neg Hx    Kidney cancer Neg Hx    Bladder Cancer Neg Hx    Prostate cancer Neg Hx      Current Outpatient Medications:    acetaminophen (TYLENOL) 500 MG tablet, Take 1,000 mg by mouth every 6 (six) hours. Rapid release, Disp: , Rfl:    atorvastatin (LIPITOR) 20 MG tablet, TAKE 1 TABLET BY MOUTH IN THE EVENING FOR CHOLESTEROL (Patient taking differently: Take 20 mg by mouth in the morning.), Disp: 90 tablet, Rfl: 1   clopidogrel (PLAVIX) 75 MG tablet, Take 1 tablet (75 mg total) by mouth daily., Disp: 90 tablet, Rfl: 3   dexamethasone (DECADRON) 4 MG tablet, Take 2 tablets daily for 2 days, start the day after chemotherapy. Take with food., Disp: 30 tablet, Rfl: 1   ferrous gluconate (FERGON) 324 MG tablet, TAKE 1 TABLET BY MOUTH EVERY DAY WITH BREAKFAST (Patient taking differently: Take 324 mg by mouth daily with breakfast.), Disp: 30 tablet, Rfl: 0   FIBER PO, Take 1 capsule by mouth in the morning., Disp:  , Rfl:    finasteride (PROSCAR) 5 MG tablet, Take 1 tablet (5 mg total) by mouth daily. (Patient taking differently: Take 5 mg by mouth every morning.), Disp: 90 tablet, Rfl: 3   Fluticasone-Umeclidin-Vilant (TRELEGY ELLIPTA) 100-62.5-25 MCG/ACT AEPB, Inhale 1 puff into the lungs daily., Disp: 28 each, Rfl: 11   glipiZIDE (GLUCOTROL) 10 MG tablet, Take 10 mg by mouth daily before breakfast., Disp: , Rfl:    GLUCOSAMINE HCL PO, Take 1 tablet by mouth daily., Disp: , Rfl:    Insulin Glargine (BASAGLAR KWIKPEN) 100 UNIT/ML, Inject 30 Units into the skin every morning., Disp: , Rfl:    lidocaine-prilocaine (EMLA) cream, Apply to affected area once, Disp: 30 g, Rfl: 3   lisinopril (ZESTRIL) 20 MG tablet, Take 1 tablet (20 mg total) by mouth daily. (Patient taking differently: Take 20 mg by mouth daily as needed. Patient reports taking if systolic over 160), Disp: 90 tablet, Rfl: 1   metFORMIN (GLUCOPHAGE) 500 MG tablet, TAKE 2 TABLETS BY MOUTH TWICE A DAY WITH MEAL FOR DIABETES (Patient taking differently: Take 1,000 mg by mouth 2 (two) times daily. TAKE 2 TABLETS BY MOUTH TWICE A DAY WITH MEAL FOR DIABETES), Disp: 360 tablet, Rfl: 2   ondansetron (ZOFRAN) 8 MG tablet, Take 1 tablet (8 mg total) by mouth every 8 (eight) hours as needed for nausea or vomiting. Start on the third day after chemotherapy., Disp: 30 tablet, Rfl: 1   pantoprazole (PROTONIX) 40 MG tablet, TAKE 1 TABLET (40 MG TOTAL) BY MOUTH DAILY. FOR HEARTBURN (Patient taking differently: Take 40 mg by mouth every evening. For heartburn), Disp: 90 tablet, Rfl: 0   prochlorperazine (COMPAZINE) 10 MG tablet, Take 1 tablet (10 mg total) by mouth every 6 (six) hours as needed for nausea or vomiting., Disp: 30 tablet, Rfl: 1   sildenafil (VIAGRA)  100 MG tablet, Take 1 tablet (100 mg total) by mouth daily as needed for erectile dysfunction., Disp: 30 tablet, Rfl: 0   tamsulosin (FLOMAX) 0.4 MG CAPS capsule, Take 1 capsule (0.4 mg total) by mouth 2  (two) times daily., Disp: 180 capsule, Rfl: 3   vitamin B-12 (CYANOCOBALAMIN) 1000 MCG tablet, Take 1,000 mcg by mouth in the morning., Disp: , Rfl:   Physical exam:  Vitals:   08/21/22 0843  BP: (!) 115/57  Pulse: 88  Resp: 18  Temp: (!) 97.1 F (36.2 C)  TempSrc: Tympanic  SpO2: 100%  Weight: 180 lb 1.6 oz (81.7 kg)  Height: 5\' 7"  (1.702 m)   Physical Exam Cardiovascular:     Rate and Rhythm: Normal rate and regular rhythm.     Heart sounds: Normal heart sounds.  Pulmonary:     Effort: Pulmonary effort is normal.     Breath sounds: Normal breath sounds.  Abdominal:     General: Bowel sounds are normal.     Palpations: Abdomen is soft.  Skin:    General: Skin is warm and dry.  Neurological:     Mental Status: He is alert and oriented to person, place, and time.         Latest Ref Rng & Units 08/21/2022    8:33 AM  CMP  Glucose 70 - 99 mg/dL 161   BUN 8 - 23 mg/dL 30   Creatinine 0.96 - 1.24 mg/dL 0.45   Sodium 409 - 811 mmol/L 137   Potassium 3.5 - 5.1 mmol/L 4.4   Chloride 98 - 111 mmol/L 110   CO2 22 - 32 mmol/L 19   Calcium 8.9 - 10.3 mg/dL 8.1   Total Protein 6.5 - 8.1 g/dL 5.9   Total Bilirubin 0.3 - 1.2 mg/dL 0.2   Alkaline Phos 38 - 126 U/L 68   AST 15 - 41 U/L 13   ALT 0 - 44 U/L 11       Latest Ref Rng & Units 08/21/2022    8:33 AM  CBC  WBC 4.0 - 10.5 K/uL 2.6   Hemoglobin 13.0 - 17.0 g/dL 91.4   Hematocrit 78.2 - 52.0 % 32.5   Platelets 150 - 400 K/uL 234      Assessment and plan- Patient is a 77 y.o. male with history of stage I non-small cell lung cancer involving the left upper lobe in 2023 now with biopsy-proven mediastinal recurrence.  He is here for on treatment assessment prior to cycle 7 of weekly CarboTaxol chemotherapy  Counts okay to proceed with cycle 7 of weekly CarboTaxol chemotherapy today which will be his last cycle.  White cell count is 2.6 today but ANC remains more than 1.5.  He will be scheduled to get CT chest abdomen and  pelvis with contrast in 2 weeks and I will see him thereafter.  If scans show partial response/stable disease plan will be to proceed with maintenance immunotherapy for 1 year.  AKI: Creatinine is mildly elevated at 1.2 today and I will give him 1 L of IV fluids today  Chemo-induced nausea: Continue home as needed medications   Visit Diagnosis 1. Encounter for antineoplastic chemotherapy   2. Recurrent non-small cell lung cancer (HCC)   3. AKI (acute kidney injury) (HCC)      Dr. Owens Shark, MD, MPH [facility] at [facility] [phone] 08/21/2022 8:50 AM

## 2022-08-21 NOTE — Patient Instructions (Signed)

## 2022-08-22 ENCOUNTER — Other Ambulatory Visit: Payer: Self-pay

## 2022-08-22 ENCOUNTER — Ambulatory Visit: Admission: RE | Admit: 2022-08-22 | Payer: Medicare Other | Source: Ambulatory Visit

## 2022-08-22 DIAGNOSIS — C3412 Malignant neoplasm of upper lobe, left bronchus or lung: Secondary | ICD-10-CM | POA: Diagnosis not present

## 2022-08-22 DIAGNOSIS — Z510 Encounter for antineoplastic radiation therapy: Secondary | ICD-10-CM | POA: Diagnosis not present

## 2022-08-22 DIAGNOSIS — Z5111 Encounter for antineoplastic chemotherapy: Secondary | ICD-10-CM | POA: Diagnosis not present

## 2022-08-22 DIAGNOSIS — C3411 Malignant neoplasm of upper lobe, right bronchus or lung: Secondary | ICD-10-CM | POA: Diagnosis not present

## 2022-08-22 DIAGNOSIS — Z79899 Other long term (current) drug therapy: Secondary | ICD-10-CM | POA: Diagnosis not present

## 2022-08-22 DIAGNOSIS — Z87891 Personal history of nicotine dependence: Secondary | ICD-10-CM | POA: Diagnosis not present

## 2022-08-22 LAB — RAD ONC ARIA SESSION SUMMARY
Course Elapsed Days: 41
Plan Fractions Treated to Date: 29
Plan Prescribed Dose Per Fraction: 2 Gy
Plan Total Fractions Prescribed: 33
Plan Total Prescribed Dose: 66 Gy
Reference Point Dosage Given to Date: 58 Gy
Reference Point Session Dosage Given: 2 Gy
Session Number: 29

## 2022-08-23 ENCOUNTER — Other Ambulatory Visit: Payer: Self-pay

## 2022-08-23 ENCOUNTER — Ambulatory Visit
Admission: RE | Admit: 2022-08-23 | Discharge: 2022-08-23 | Disposition: A | Discharge status: Home / Self Care | Payer: Medicare Other | Source: Ambulatory Visit | Attending: Radiation Oncology | Admitting: Radiation Oncology

## 2022-08-23 DIAGNOSIS — Z510 Encounter for antineoplastic radiation therapy: Secondary | ICD-10-CM | POA: Diagnosis not present

## 2022-08-23 DIAGNOSIS — Z87891 Personal history of nicotine dependence: Secondary | ICD-10-CM | POA: Diagnosis not present

## 2022-08-23 DIAGNOSIS — C3412 Malignant neoplasm of upper lobe, left bronchus or lung: Secondary | ICD-10-CM | POA: Diagnosis not present

## 2022-08-23 DIAGNOSIS — C3411 Malignant neoplasm of upper lobe, right bronchus or lung: Secondary | ICD-10-CM | POA: Diagnosis not present

## 2022-08-23 DIAGNOSIS — Z79899 Other long term (current) drug therapy: Secondary | ICD-10-CM | POA: Diagnosis not present

## 2022-08-23 DIAGNOSIS — Z5111 Encounter for antineoplastic chemotherapy: Secondary | ICD-10-CM | POA: Diagnosis not present

## 2022-08-23 LAB — RAD ONC ARIA SESSION SUMMARY
Course Elapsed Days: 42
Plan Fractions Treated to Date: 30
Plan Prescribed Dose Per Fraction: 2 Gy
Plan Total Fractions Prescribed: 33
Plan Total Prescribed Dose: 66 Gy
Reference Point Dosage Given to Date: 60 Gy
Reference Point Session Dosage Given: 2 Gy
Session Number: 30

## 2022-08-24 ENCOUNTER — Other Ambulatory Visit: Payer: Self-pay

## 2022-08-24 ENCOUNTER — Ambulatory Visit
Admission: RE | Admit: 2022-08-24 | Discharge: 2022-08-24 | Disposition: A | Discharge status: Home / Self Care | Payer: Medicare Other | Source: Ambulatory Visit | Attending: Radiation Oncology | Admitting: Radiation Oncology

## 2022-08-24 DIAGNOSIS — Z79899 Other long term (current) drug therapy: Secondary | ICD-10-CM | POA: Diagnosis not present

## 2022-08-24 DIAGNOSIS — C3411 Malignant neoplasm of upper lobe, right bronchus or lung: Secondary | ICD-10-CM | POA: Diagnosis not present

## 2022-08-24 DIAGNOSIS — Z87891 Personal history of nicotine dependence: Secondary | ICD-10-CM | POA: Diagnosis not present

## 2022-08-24 DIAGNOSIS — Z5111 Encounter for antineoplastic chemotherapy: Secondary | ICD-10-CM | POA: Diagnosis not present

## 2022-08-24 DIAGNOSIS — C3412 Malignant neoplasm of upper lobe, left bronchus or lung: Secondary | ICD-10-CM | POA: Diagnosis not present

## 2022-08-24 DIAGNOSIS — Z510 Encounter for antineoplastic radiation therapy: Secondary | ICD-10-CM | POA: Diagnosis not present

## 2022-08-24 LAB — RAD ONC ARIA SESSION SUMMARY
Course Elapsed Days: 43
Plan Fractions Treated to Date: 31
Plan Prescribed Dose Per Fraction: 2 Gy
Plan Total Fractions Prescribed: 33
Plan Total Prescribed Dose: 66 Gy
Reference Point Dosage Given to Date: 62 Gy
Reference Point Session Dosage Given: 2 Gy
Session Number: 31

## 2022-08-25 ENCOUNTER — Other Ambulatory Visit: Payer: Self-pay

## 2022-08-25 ENCOUNTER — Ambulatory Visit
Admission: RE | Admit: 2022-08-25 | Discharge: 2022-08-25 | Disposition: A | Discharge status: Home / Self Care | Payer: Medicare Other | Source: Ambulatory Visit | Attending: Radiation Oncology | Admitting: Radiation Oncology

## 2022-08-25 DIAGNOSIS — C3411 Malignant neoplasm of upper lobe, right bronchus or lung: Secondary | ICD-10-CM | POA: Diagnosis not present

## 2022-08-25 DIAGNOSIS — C3412 Malignant neoplasm of upper lobe, left bronchus or lung: Secondary | ICD-10-CM | POA: Diagnosis not present

## 2022-08-25 DIAGNOSIS — Z79899 Other long term (current) drug therapy: Secondary | ICD-10-CM | POA: Diagnosis not present

## 2022-08-25 DIAGNOSIS — Z510 Encounter for antineoplastic radiation therapy: Secondary | ICD-10-CM | POA: Diagnosis not present

## 2022-08-25 DIAGNOSIS — Z87891 Personal history of nicotine dependence: Secondary | ICD-10-CM | POA: Diagnosis not present

## 2022-08-25 DIAGNOSIS — Z5111 Encounter for antineoplastic chemotherapy: Secondary | ICD-10-CM | POA: Diagnosis not present

## 2022-08-25 LAB — RAD ONC ARIA SESSION SUMMARY
Course Elapsed Days: 44
Plan Fractions Treated to Date: 32
Plan Prescribed Dose Per Fraction: 2 Gy
Plan Total Fractions Prescribed: 33
Plan Total Prescribed Dose: 66 Gy
Reference Point Dosage Given to Date: 64 Gy
Reference Point Session Dosage Given: 2 Gy
Session Number: 32

## 2022-08-26 ENCOUNTER — Other Ambulatory Visit: Payer: Self-pay | Admitting: Primary Care

## 2022-08-26 DIAGNOSIS — K2100 Gastro-esophageal reflux disease with esophagitis, without bleeding: Secondary | ICD-10-CM

## 2022-08-27 ENCOUNTER — Other Ambulatory Visit: Payer: Self-pay | Admitting: Family Medicine

## 2022-08-28 ENCOUNTER — Other Ambulatory Visit: Payer: Self-pay

## 2022-08-28 ENCOUNTER — Ambulatory Visit
Admission: RE | Admit: 2022-08-28 | Discharge: 2022-08-28 | Disposition: A | Discharge status: Home / Self Care | Payer: Medicare Other | Source: Ambulatory Visit | Attending: Radiation Oncology | Admitting: Radiation Oncology

## 2022-08-28 DIAGNOSIS — Z5111 Encounter for antineoplastic chemotherapy: Secondary | ICD-10-CM | POA: Diagnosis not present

## 2022-08-28 DIAGNOSIS — C3411 Malignant neoplasm of upper lobe, right bronchus or lung: Secondary | ICD-10-CM | POA: Diagnosis not present

## 2022-08-28 DIAGNOSIS — Z510 Encounter for antineoplastic radiation therapy: Secondary | ICD-10-CM | POA: Diagnosis not present

## 2022-08-28 DIAGNOSIS — Z79899 Other long term (current) drug therapy: Secondary | ICD-10-CM | POA: Diagnosis not present

## 2022-08-28 DIAGNOSIS — Z87891 Personal history of nicotine dependence: Secondary | ICD-10-CM | POA: Diagnosis not present

## 2022-08-28 DIAGNOSIS — C3412 Malignant neoplasm of upper lobe, left bronchus or lung: Secondary | ICD-10-CM | POA: Diagnosis not present

## 2022-08-28 LAB — RAD ONC ARIA SESSION SUMMARY
Course Elapsed Days: 47
Plan Fractions Treated to Date: 33
Plan Prescribed Dose Per Fraction: 2 Gy
Plan Total Fractions Prescribed: 33
Plan Total Prescribed Dose: 66 Gy
Reference Point Dosage Given to Date: 66 Gy
Reference Point Session Dosage Given: 2 Gy
Session Number: 33

## 2022-08-29 ENCOUNTER — Ambulatory Visit: Payer: Medicare Other

## 2022-08-31 ENCOUNTER — Ambulatory Visit: Payer: Medicare Other | Admitting: Podiatry

## 2022-09-04 ENCOUNTER — Other Ambulatory Visit: Payer: Medicare Other

## 2022-09-04 ENCOUNTER — Ambulatory Visit
Admission: RE | Admit: 2022-09-04 | Discharge: 2022-09-04 | Disposition: A | Discharge status: Home / Self Care | Payer: Medicare Other | Source: Ambulatory Visit | Attending: Internal Medicine | Admitting: Internal Medicine

## 2022-09-04 DIAGNOSIS — C771 Secondary and unspecified malignant neoplasm of intrathoracic lymph nodes: Secondary | ICD-10-CM | POA: Diagnosis not present

## 2022-09-04 DIAGNOSIS — J432 Centrilobular emphysema: Secondary | ICD-10-CM | POA: Diagnosis not present

## 2022-09-04 DIAGNOSIS — C3490 Malignant neoplasm of unspecified part of unspecified bronchus or lung: Secondary | ICD-10-CM | POA: Insufficient documentation

## 2022-09-04 DIAGNOSIS — C3492 Malignant neoplasm of unspecified part of left bronchus or lung: Secondary | ICD-10-CM | POA: Diagnosis not present

## 2022-09-04 MED ORDER — IOHEXOL 300 MG/ML  SOLN
75.0000 mL | Freq: Once | INTRAMUSCULAR | Status: AC | PRN
  Administered 2022-09-04: 75 mL via INTRAVENOUS

## 2022-09-08 ENCOUNTER — Ambulatory Visit (INDEPENDENT_AMBULATORY_CARE_PROVIDER_SITE_OTHER): Payer: Medicare Other | Admitting: Podiatry

## 2022-09-08 DIAGNOSIS — M79675 Pain in left toe(s): Secondary | ICD-10-CM

## 2022-09-08 DIAGNOSIS — M79674 Pain in right toe(s): Secondary | ICD-10-CM

## 2022-09-08 DIAGNOSIS — B351 Tinea unguium: Secondary | ICD-10-CM

## 2022-09-08 DIAGNOSIS — E1151 Type 2 diabetes mellitus with diabetic peripheral angiopathy without gangrene: Secondary | ICD-10-CM

## 2022-09-08 NOTE — Progress Notes (Signed)
Subjective:  Patient ID: Antonio [name], male    DOB: [birth year],  MRN: [id number]  Antonio [name] presents to clinic today for: at risk foot care. Pt has h/o NIDDM with PAD and painful thick toenails that are difficult to trim. Pain interferes with ambulation. Aggravating factors include wearing enclosed shoe gear. Pain is relieved with periodic professional debridement. Patient is currently undergoing treatment for lung cancer. He has completed chemo and radiation. Has appointment to discuss results of recent CAT scan. He is followed by [facility] for PAD. Chief Complaint  Patient presents with   Nail Problem    DFC,Referring Provider Doreene Nest, NP,LOV:04/24,A1C:8.8,BS:147 today      Past Surgical History:  Procedure Laterality Date   2D Echo  07/2001   BACK SURGERY     CAROTID PTA/STENT INTERVENTION Right 04/07/2022   Procedure: CAROTID PTA/STENT INTERVENTION;  Surgeon: Renford Dills, MD;  Location: [facility] INVASIVE CV LAB;  Service: Cardiovascular;  Laterality: Right;   COLONOSCOPY     ENDOBRONCHIAL ULTRASOUND Left 06/19/2022   Procedure: ENDOBRONCHIAL ULTRASOUND;  Surgeon: Salena Saner, MD;  Location: [facility] ORS;  Service: Pulmonary;  Laterality: Left;   ESOPHAGOGASTRODUODENOSCOPY  02/13/2007   gastritis and duodenitis without bleed   ESOPHAGOGASTRODUODENOSCOPY N/A 06/26/2014   Procedure: ESOPHAGOGASTRODUODENOSCOPY (EGD);  Surgeon: Rachael Fee, MD;  Location: [facility] ENDOSCOPY;  Service: Endoscopy;  Laterality: N/A;   ESOPHAGOGASTRODUODENOSCOPY (EGD) WITH PROPOFOL N/A 04/08/2020   Procedure: ESOPHAGOGASTRODUODENOSCOPY (EGD) WITH PROPOFOL;  Surgeon: Meryl Dare, MD;  Location: [facility] ENDOSCOPY;  Service: Endoscopy;  Laterality: N/A;   FLEXIBLE BRONCHOSCOPY Left 06/19/2022   Procedure: FLEXIBLE BRONCHOSCOPY;  Surgeon: Salena Saner, MD;  Location: [facility] ORS;  Service: Pulmonary;  Laterality: Left;   HOT HEMOSTASIS N/A 04/08/2020   Procedure: HOT HEMOSTASIS (ARGON  PLASMA COAGULATION/BICAP);  Surgeon: Meryl Dare, MD;  Location: [facility]OSCOPY;  Service: Endoscopy;  Laterality: N/A;   IR IMAGING GUIDED PORT INSERTION  06/30/2022   KNEE ARTHROSCOPY Right 11/1998   LEFT HEART CATH AND CORONARY ANGIOGRAPHY N/A 05/23/2021   Procedure: LEFT HEART CATH AND CORONARY ANGIOGRAPHY;  Surgeon: Orbie Pyo, MD;  Location: [facility] LAB;  Service: Cardiovascular;  Laterality: N/A;   LOWER EXTREMITY ANGIOGRAPHY Right 12/27/2017   Procedure: LOWER EXTREMITY ANGIOGRAPHY;  Surgeon: Annice Needy, MD;  Location: [facility] INVASIVE CV LAB;  Service: Cardiovascular;  Laterality: Right;   LOWER EXTREMITY ANGIOGRAPHY Left 01/10/2018   Procedure: LOWER EXTREMITY ANGIOGRAPHY;  Surgeon: Annice Needy, MD;  Location: [facility] INVASIVE CV LAB;  Service: Cardiovascular;  Laterality: Left;   LOWER EXTREMITY ANGIOGRAPHY Left 05/29/2018   Procedure: LOWER EXTREMITY ANGIOGRAPHY;  Surgeon: Annice Needy, MD;  Location: [facility] INVASIVE CV LAB;  Service: Cardiovascular;  Laterality: Left;   LOWER EXTREMITY ANGIOGRAPHY Left 05/30/2018   Procedure: Lower Extremity Angiography;  Surgeon: Annice Needy, MD;  Location: [facility] INVASIVE CV LAB;  Service: Cardiovascular;  Laterality: Left;   LP  07/2001   Microwave thermotherapy prostate  08/28/2007   Wolfe   Persantine cardiolite  04/1999   EF 68%   POLYPECTOMY  12/1993   Stress myoview  08/2004   EF 67%   UPPER GASTROINTESTINAL ENDOSCOPY      PCP is Doreene Nest, NP.  No Known Allergies  Review of Systems: Negative except as noted in the HPI.  Objective: No changes noted in today's physical examination. There were no vitals filed for this visit.  Antonio [name] is a pleasant 77 y.o. male  in NAD. AAO x 3.  Vascular Examination: Capillary refill time <3 seconds b/l LE. Palpable pedal pulses b/l LE. Digital hair absent b/l. No pedal edema b/l. Skin temperature gradient WNL b/l. No varicosities b/l. [name]  Dermatological  Examination: Pedal skin with normal turgor, texture and tone b/l. No open wounds. No interdigital macerations b/l. Toenails 1-5 b/l thickened, discolored, dystrophic with subungual debris. There is pain on palpation to dorsal aspect of nailplates. No hyperkeratotic nor porokeratotic lesions present on today's visit.[name]  Neurological Examination: Protective sensation diminished with 10g monofilament b/l.  Musculoskeletal Examination: Muscle strength 5/5 to all lower extremity muscle groups bilaterally. No pain, crepitus or joint limitation noted with ROM bilateral LE. No gross bony deformities bilaterally. Utilizes cane for ambulation assistance.     Latest Ref Rng & Units 04/05/2022    3:04 PM  Hemoglobin A1C  Hemoglobin-A1c 4.8 - 5.6 % 8.8    Assessment/Plan: 1. Pain due to onychomycosis of toenails of both feet   2. Type II diabetes mellitus with peripheral circulatory disorder [facility])     -Patient was evaluated and treated. All patient's and/or POA's questions/concerns answered on today's visit. -Continue foot and shoe inspections daily. Monitor blood glucose per PCP/Endocrinologist's recommendations. -Patient to continue soft, supportive shoe gear daily. -Toenails 1-5 bilaterally were debrided in length and girth with sterile nail nippers and dremel. Pinpoint bleeding of right great toe addressed with Lumicain Hemostatic Solution, cleansed with alcohol. Triple antibiotic ointment applied. No further treatment required by patient/caregiver. -Patient/POA to call should there be question/concern in the interim.   Return in about 3 months (around 12/09/2022).  Freddie Breech, DPM              Office [phone]  Fax [phone]

## 2022-09-11 ENCOUNTER — Encounter: Payer: Self-pay | Admitting: Pulmonary Disease

## 2022-09-13 ENCOUNTER — Inpatient Hospital Stay: Payer: Medicare Other | Attending: Oncology | Admitting: Oncology

## 2022-09-13 ENCOUNTER — Encounter: Payer: Self-pay | Admitting: Oncology

## 2022-09-13 VITALS — BP 160/77 | HR 61 | Temp 97.4°F | Resp 18 | Ht 67.0 in | Wt 183.0 lb

## 2022-09-13 DIAGNOSIS — C3411 Malignant neoplasm of upper lobe, right bronchus or lung: Secondary | ICD-10-CM | POA: Insufficient documentation

## 2022-09-13 DIAGNOSIS — Z79899 Other long term (current) drug therapy: Secondary | ICD-10-CM | POA: Insufficient documentation

## 2022-09-13 DIAGNOSIS — Z7189 Other specified counseling: Secondary | ICD-10-CM | POA: Diagnosis not present

## 2022-09-13 DIAGNOSIS — Z79620 Long term (current) use of immunosuppressive biologic: Secondary | ICD-10-CM | POA: Diagnosis not present

## 2022-09-13 DIAGNOSIS — Z5112 Encounter for antineoplastic immunotherapy: Secondary | ICD-10-CM | POA: Insufficient documentation

## 2022-09-13 DIAGNOSIS — Z87891 Personal history of nicotine dependence: Secondary | ICD-10-CM | POA: Insufficient documentation

## 2022-09-13 DIAGNOSIS — C3490 Malignant neoplasm of unspecified part of unspecified bronchus or lung: Secondary | ICD-10-CM | POA: Diagnosis not present

## 2022-09-14 ENCOUNTER — Encounter: Payer: Self-pay | Admitting: Oncology

## 2022-09-14 NOTE — Progress Notes (Signed)
Hematology/Oncology Consult note [facility]  [phone] [phone]  Patient Care Team: Doreene Nest, NP as PCP - General (Internal Medicine) Debbe Odea, MD as PCP - Cardiology (Cardiology) Duke Salvia, MD as PCP - Electrophysiology (Cardiology) Harle Battiest PA-C as Physician Assistant (Urology) Meryl Dare, MD as Consulting Physician (Gastroenterology) Kathyrn Sheriff, [facility] (Inactive) as Pharmacist (Pharmacist) Glory Buff, RN as Oncology Nurse Navigator Creig Hines, MD as Consulting Physician (Oncology)   Name of the patient: Antonio [name]  [id number]  Aug 08, 1945   Date of visit: 09/14/22  Diagnosis-locally recurrent adenocarcinoma of the lung  Chief complaint/ Reason for visit-discuss CT scan results and further management  Heme/Onc history: patient is a 77 year old male who was diagnosed with stage I non-small cell lung cancer involving the left upper lobe about 1 year ago and he received SBRT for the same. He has been undergoing surveillance CT scans since then. CT chest in April 2024 showed increasing areas of disease with left-sided mediastinal and hilar lymph nodes and soft tissue along the left main bronchus. 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 No residual focal hypermetabolism in the treated area of the left upper lobe. No evidence of distant metastatic disease. He underwent MRI brain without contrast in February 2024 which did not show any evidence of metastatic disease. Patient was seen by Dr. Jayme Cloud and underwent EBUS guided biopsies left paratracheal lymph node was biopsied and consistent with pulmonary adenocarcinoma.  Patient completed concurrent chemoradiation with weekly CarboTaxol chemotherapy in July 2024.  Interval history-patient is doing well post chemoradiation.  Denies any  significant pain cough or shortness of breath.  ECOG PS- 1 Pain scale- 0   Review of systems- Review of Systems  Constitutional:  Negative for chills, fever, malaise/fatigue and weight loss.  HENT:  Negative for congestion, ear discharge and nosebleeds.   Eyes:  Negative for blurred vision.  Respiratory:  Negative for cough, hemoptysis, sputum production, shortness of breath and wheezing.   Cardiovascular:  Negative for chest pain, palpitations, orthopnea and claudication.  Gastrointestinal:  Negative for abdominal pain, blood in stool, constipation, diarrhea, heartburn, melena, nausea and vomiting.  Genitourinary:  Negative for dysuria, flank pain, frequency, hematuria and urgency.  Musculoskeletal:  Negative for back pain, joint pain and myalgias.  Skin:  Negative for rash.  Neurological:  Negative for dizziness, tingling, focal weakness, seizures, weakness and headaches.  Endo/Heme/Allergies:  Does not bruise/bleed easily.  Psychiatric/Behavioral:  Negative for depression and suicidal ideas. The patient does not have insomnia.       No Known Allergies   Past Medical History:  Diagnosis Date   Adenoma of left adrenal gland    Anemia    Aortic atherosclerosis (HCC)    Arthritis    Atrial fibrillation and flutter (HCC)    a.) CHA2DS2VASc = 7 (age x 2, HTN, CVA x 2, vascular disease history, T2DM);  b.) rate/rhythm maintained without pharmacological intervention; chronic antithrombotic therapy using ASA + clopidogrel   Bilateral carotid artery stenosis    a.) s/p PTA 04/07/2022 --> 90% RICA --> 9x7x30 Exact stent; b.) doppler 03/14/20214: 1-39% RICA, 40-59% LICA   BPH with urinary obstruction    stable on flomax ([name]   Childhood asthma    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   a.) CT head and MRI brain 04/05/2022: chronic cortical/subcortical RIGHT MCA territory  infarct within the RIGHT parietal lobe and RIGHT temporoparietal junction   Coronary atherosclerosis  of unspecified type of vessel, native or graft    a.) cCTA 05/22/2021: Ca score 1524 (84th percentile for age/sex match control) --> FFRct: dLAD = 0.77, dLCx = 0.67, RPLA = 0.77, dOM1 = 0.66; b.) LHC 05/23/2021: 20% dRCA, 50% OM1 - med mgmt   Diastolic dysfunction    a.) TTE 04/06/2022: EF 60-65%, mod LVH, mild MR, AoV sclerosis, G1DD   DOE (dyspnea on exertion)    Emphysema of lung (HCC)    Erectile dysfunction    a.) on PDE5i (sildenafil)   Esophageal stricture    Fall    Gastric AVM    Gastritis    GERD (gastroesophageal reflux disease)    Hiatal hernia    Hx of colonic polyps    Hyperlipidemia    Hypertension    Internal hemorrhoids without mention of complication    Ischemic leg    Long term current use of antithrombotics/antiplatelets    a.) on DAPT (ASA + clopidogrel)   Lumbago    Mobitz (type) I (Wenckebach's) atrioventricular block    Non-small cell lung cancer (HCC)    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   a.) PCR (+) 05/29/2018, 04/07/2022   Pneumonia    PSA elevation    now averaging 2's   PVD (peripheral vascular disease) with claudication (HCC)    Sigmoid diverticulosis    Tobacco abuse    Type 2 diabetes mellitus treated with insulin [facility])      Past Surgical History:  Procedure Laterality Date   2D Echo  07/2001   BACK SURGERY     CAROTID PTA/STENT INTERVENTION Right 04/07/2022   Procedure: CAROTID PTA/STENT INTERVENTION;  Surgeon: Renford Dills, MD;  Location: [facility] INVASIVE CV LAB;  Service: Cardiovascular;  Laterality: Right;   COLONOSCOPY     ENDOBRONCHIAL ULTRASOUND Left 06/19/2022   Procedure: ENDOBRONCHIAL ULTRASOUND;  Surgeon: Salena Saner, MD;  Location: [facility] ORS;  Service: Pulmonary;  Laterality: Left;   ESOPHAGOGASTRODUODENOSCOPY  02/13/2007   gastritis and duodenitis without bleed   ESOPHAGOGASTRODUODENOSCOPY N/A 06/26/2014   Procedure: ESOPHAGOGASTRODUODENOSCOPY (EGD);  Surgeon: Rachael Fee, MD;  Location: [facility] ENDOSCOPY;   Service: Endoscopy;  Laterality: N/A;   ESOPHAGOGASTRODUODENOSCOPY (EGD) WITH PROPOFOL N/A 04/08/2020   Procedure: ESOPHAGOGASTRODUODENOSCOPY (EGD) WITH PROPOFOL;  Surgeon: Meryl Dare, MD;  Location: [facility] ENDOSCOPY;  Service: Endoscopy;  Laterality: N/A;   FLEXIBLE BRONCHOSCOPY Left 06/19/2022   Procedure: FLEXIBLE BRONCHOSCOPY;  Surgeon: Salena Saner, MD;  Location: [facility] ORS;  Service: Pulmonary;  Laterality: Left;   HOT HEMOSTASIS N/A 04/08/2020   Procedure: HOT HEMOSTASIS (ARGON PLASMA COAGULATION/BICAP);  Surgeon: Meryl Dare, MD;  Location: [facility] ENDOSCOPY;  Service: Endoscopy;  Laterality: N/A;   IR IMAGING GUIDED PORT INSERTION  06/30/2022   KNEE ARTHROSCOPY Right 11/1998   LEFT HEART CATH AND CORONARY ANGIOGRAPHY N/A 05/23/2021   Procedure: LEFT HEART CATH AND CORONARY ANGIOGRAPHY;  Surgeon: Orbie Pyo, MD;  Location: [facility] INVASIVE CV LAB;  Service: Cardiovascular;  Laterality: N/A;   LOWER EXTREMITY ANGIOGRAPHY Right 12/27/2017   Procedure: LOWER EXTREMITY ANGIOGRAPHY;  Surgeon: Annice Needy, MD;  Location: [facility] INVASIVE CV LAB;  Service: Cardiovascular;  Laterality: Right;   LOWER EXTREMITY ANGIOGRAPHY Left 01/10/2018   Procedure: LOWER EXTREMITY ANGIOGRAPHY;  Surgeon: Annice Needy, MD;  Location: [facility] INVASIVE CV LAB;  Service: Cardiovascular;  Laterality: Left;   LOWER EXTREMITY ANGIOGRAPHY Left 05/29/2018   Procedure: LOWER  EXTREMITY ANGIOGRAPHY;  Surgeon: Annice Needy, MD;  Location: [facility] INVASIVE CV LAB;  Service: Cardiovascular;  Laterality: Left;   LOWER EXTREMITY ANGIOGRAPHY Left 05/30/2018   Procedure: Lower Extremity Angiography;  Surgeon: Annice Needy, MD;  Location: [facility] INVASIVE CV LAB;  Service: Cardiovascular;  Laterality: Left;   LP  07/2001   Microwave thermotherapy prostate  08/28/2007   [place]   Persantine cardiolite  04/1999   EF 68%   POLYPECTOMY  12/1993   Stress myoview  08/2004   EF 67%   UPPER GASTROINTESTINAL ENDOSCOPY      Social History    Socioeconomic History   Marital status: Widowed    Spouse name: Armando Reichert   Number of children: 3   Years of education: Not on file   Highest education level: Not on file  Occupational History   Occupation: Retired Music therapist  Tobacco Use   Smoking status: Former    Current packs/day: 0.00    Average packs/day: 2.0 packs/day for 57.3 years (114.5 ttl pk-yrs)    Types: Cigarettes    Start date: 02/07/1964    Quit date: 05/11/2021    Years since quitting: 1.3   Smokeless tobacco: Former    Types: Chew   Tobacco comments:    Quit one year ago  Vaping Use   Vaping status: Never Used  Substance and Sexual Activity   Alcohol use: Yes    Comment: rare   Drug use: No   Sexual activity: Yes  Other Topics Concern   Not on file  Social History Narrative   Married 9/09 wife with moderate memory problems.   3 adult children, 2 grandchildren   Would desire CPR   Social Determinants of Health   Financial Resource Strain: Low Risk  (01/24/2022)   Overall Financial Resource Strain (CARDIA)    Difficulty of Paying Living Expenses: Not hard at all  Food Insecurity: No Food Insecurity (06/26/2022)   Hunger Vital Sign    Worried About Running Out of Food in the Last Year: Never true    Ran Out of Food in the Last Year: Never true  Transportation Needs: No Transportation Needs (06/26/2022)   PRAPARE - Administrator, Civil Service (Medical): No    Lack of Transportation (Non-Medical): No  Physical Activity: Insufficiently Active (01/24/2022)   Exercise Vital Sign    Days of Exercise per Week: 3 days    Minutes of Exercise per Session: 30 min  Stress: No Stress Concern Present (01/24/2022)   [employer] of Occupational Health - Occupational Stress Questionnaire    Feeling of Stress : Not at all  Social Connections: Moderately Isolated (01/24/2022)   Social Connection and Isolation Panel [NHANES]    Frequency of Communication with Friends and Family: More than three times a  week    Frequency of Social Gatherings with Friends and Family: Once a week    Attends Religious Services: More than 4 times per year    Active Member of [employer] or Organizations: No    Attends Banker Meetings: Never    Marital Status: Widowed  Intimate Partner Violence: Not At Risk (06/26/2022)   Humiliation, Afraid, Rape, and Kick questionnaire    Fear of Current or Ex-Partner: No    Emotionally Abused: No    Physically Abused: No    Sexually Abused: No    Family History  Problem Relation Age of Onset   Rheum arthritis Mother    Diabetes Father    Alcohol  abuse Paternal Uncle    Alcohol abuse Paternal Uncle    Alcohol abuse Paternal Uncle    Heart disease Neg Hx    Stroke Neg Hx    Cancer Neg Hx    Drug abuse Neg Hx    Depression Neg Hx    Colon cancer Neg Hx    Rectal cancer Neg Hx    Stomach cancer Neg Hx    Kidney cancer Neg Hx    Bladder Cancer Neg Hx    Prostate cancer Neg Hx      Current Outpatient Medications:    acetaminophen (TYLENOL) 500 MG tablet, Take 1,000 mg by mouth every 6 (six) hours. Rapid release, Disp: , Rfl:    atorvastatin (LIPITOR) 20 MG tablet, TAKE 1 TABLET BY MOUTH IN THE EVENING FOR CHOLESTEROL (Patient taking differently: Take 20 mg by mouth in the morning.), Disp: 90 tablet, Rfl: 1   clopidogrel (PLAVIX) 75 MG tablet, Take 1 tablet (75 mg total) by mouth daily., Disp: 90 tablet, Rfl: 3   ferrous gluconate (FERGON) 324 MG tablet, TAKE 1 TABLET BY MOUTH EVERY DAY WITH BREAKFAST (Patient taking differently: Take 324 mg by mouth daily with breakfast.), Disp: 30 tablet, Rfl: 0   FIBER PO, Take 1 capsule by mouth in the morning., Disp: , Rfl:    finasteride (PROSCAR) 5 MG tablet, Take 1 tablet (5 mg total) by mouth daily. (Patient taking differently: Take 5 mg by mouth every morning.), Disp: 90 tablet, Rfl: 3   Fluticasone-Umeclidin-Vilant (TRELEGY ELLIPTA) 100-62.5-25 MCG/ACT AEPB, Inhale 1 puff into the lungs daily., Disp: 28 each, Rfl:  11   glipiZIDE (GLUCOTROL) 10 MG tablet, Take 10 mg by mouth daily before breakfast., Disp: , Rfl:    GLUCOSAMINE HCL PO, Take 1 tablet by mouth daily., Disp: , Rfl:    Insulin Glargine (BASAGLAR KWIKPEN) 100 UNIT/ML, Inject 30 Units into the skin every morning., Disp: , Rfl:    lisinopril (ZESTRIL) 20 MG tablet, Take 1 tablet (20 mg total) by mouth daily. (Patient taking differently: Take 20 mg by mouth daily as needed. Patient reports taking if systolic over 160), Disp: 90 tablet, Rfl: 1   metFORMIN (GLUCOPHAGE) 500 MG tablet, TAKE 2 TABLETS BY MOUTH TWICE A DAY WITH MEAL FOR DIABETES (Patient taking differently: Take 1,000 mg by mouth 2 (two) times daily. TAKE 2 TABLETS BY MOUTH TWICE A DAY WITH MEAL FOR DIABETES), Disp: 360 tablet, Rfl: 2   pantoprazole (PROTONIX) 40 MG tablet, TAKE 1 TABLET (40 MG TOTAL) BY MOUTH DAILY. FOR HEARTBURN, Disp: 90 tablet, Rfl: 0   prochlorperazine (COMPAZINE) 10 MG tablet, Take 1 tablet (10 mg total) by mouth every 6 (six) hours as needed for nausea or vomiting., Disp: 30 tablet, Rfl: 1   tamsulosin (FLOMAX) 0.4 MG CAPS capsule, Take 1 capsule (0.4 mg total) by mouth 2 (two) times daily., Disp: 180 capsule, Rfl: 3   vitamin B-12 (CYANOCOBALAMIN) 1000 MCG tablet, Take 1,000 mcg by mouth in the morning., Disp: , Rfl:    dexamethasone (DECADRON) 4 MG tablet, Take 2 tablets daily for 2 days, start the day after chemotherapy. Take with food. (Patient not taking: Reported on 09/08/2022), Disp: 30 tablet, Rfl: 1   lidocaine-prilocaine (EMLA) cream, Apply to affected area once (Patient not taking: Reported on 09/08/2022), Disp: 30 g, Rfl: 3   ondansetron (ZOFRAN) 8 MG tablet, Take 1 tablet (8 mg total) by mouth every 8 (eight) hours as needed for nausea or vomiting. Start on the third day  after chemotherapy. (Patient not taking: Reported on 09/08/2022), Disp: 30 tablet, Rfl: 1   sildenafil (VIAGRA) 100 MG tablet, Take 1 tablet (100 mg total) by mouth daily as needed for erectile  dysfunction. (Patient not taking: Reported on 09/08/2022), Disp: 30 tablet, Rfl: 0  Physical exam:  Vitals:   09/13/22 1303  BP: (!) 160/77  Pulse: 61  Resp: 18  Temp: (!) 97.4 F (36.3 C)  TempSrc: Tympanic  SpO2: 98%  Weight: 183 lb (83 kg)  Height: 5\' 7"  (1.702 m)   Physical Exam Cardiovascular:     Rate and Rhythm: Normal rate and regular rhythm.     Heart sounds: Normal heart sounds.  Pulmonary:     Effort: Pulmonary effort is normal.     Breath sounds: Normal breath sounds.  Skin:    General: Skin is warm and dry.  Neurological:     Mental Status: He is alert and oriented to person, place, and time.         Latest Ref Rng & Units 08/21/2022    8:33 AM  CMP  Glucose 70 - 99 mg/dL 161   BUN 8 - 23 mg/dL 30   Creatinine 0.96 - 1.24 mg/dL 0.45   Sodium 409 - 811 mmol/L 137   Potassium 3.5 - 5.1 mmol/L 4.4   Chloride 98 - 111 mmol/L 110   CO2 22 - 32 mmol/L 19   Calcium 8.9 - 10.3 mg/dL 8.1   Total Protein 6.5 - 8.1 g/dL 5.9   Total Bilirubin 0.3 - 1.2 mg/dL 0.2   Alkaline Phos 38 - 126 U/L 68   AST 15 - 41 U/L 13   ALT 0 - 44 U/L 11       Latest Ref Rng & Units 08/21/2022    8:33 AM  CBC  WBC 4.0 - 10.5 K/uL 2.6   Hemoglobin 13.0 - 17.0 g/dL 91.4   Hematocrit 78.2 - 52.0 % 32.5   Platelets 150 - 400 K/uL 234     No images are attached to the encounter.  CT CHEST W CONTRAST  Result Date: 09/10/2022 CLINICAL DATA:  Recurrent non-small-cell left lung cancer. Restaging. * Tracking Code: BO * EXAM: CT CHEST WITH CONTRAST TECHNIQUE: Multidetector CT imaging of the chest was performed during intravenous contrast administration. RADIATION DOSE REDUCTION: This exam was performed according to the departmental dose-optimization program which includes automated exposure control, adjustment of the mA and/or kV according to patient size and/or use of iterative reconstruction technique. CONTRAST:  75mL OMNIPAQUE IOHEXOL 300 MG/ML  SOLN COMPARISON:  05/30/2022 PET-CT.   05/11/2022 chest CT. FINDINGS: Cardiovascular: Normal heart size. No significant pericardial effusion/thickening. Three-vessel coronary atherosclerosis. Right internal jugular Port-A-Cath terminates in the lower third of the SVC. Atherosclerotic nonaneurysmal thoracic aorta. Normal caliber pulmonary arteries. No central pulmonary emboli. Mediastinum/Nodes: No significant thyroid nodules. Generalized circumferential mild thoracic esophageal wall thickening, unchanged. No axillary adenopathy. Mildly enlarged 1.1 cm low left paratracheal node (series 2/image 51), decreased from 1.4 cm on 05/11/2022 CT. Mildly enlarged 1.1 cm high left paratracheal node (series 2/image 34), decreased from 1.3 cm. No pathologically enlarged hilar nodes. Lungs/Pleura: No pneumothorax. No pleural effusion. Thick bandlike consolidation in the posterior left upper lobe with associated volume loss, not appreciably changed, compatible with post treatment change. New poorly marginated anterior left upper lobe 1.3 x 0.8 cm pulmonary nodule (series 4/image 46). No additional significant pulmonary nodules. Mild centrilobular emphysema. Upper abdomen: Left adrenal 2.8 cm nodule with density 21 HU, stable, previously  characterized as a benign adenoma. Musculoskeletal: No aggressive appearing focal osseous lesions. Moderate thoracic spondylosis. IMPRESSION: 1. New poorly marginated anterior left upper lobe 1.3 x 0.8 cm pulmonary nodule, cannot exclude new pulmonary metastasis versus inflammatory lesion. Suggest attention on short-term follow-up chest CT in 3 months. PET-CT could be obtained in 2-4 weeks for further characterization if clinically warranted. 2. Left paratracheal nodal metastases are mildly decreased. 3. Stable post treatment change in the posterior left upper lobe. 4. Three-vessel coronary atherosclerosis. 5. Stable left adrenal adenoma, for which no follow-up imaging is recommended. 6. Chronic generalized circumferential mild thoracic  esophageal wall thickening, nonspecific, consider reflux esophagitis or post treatment change. 7. Aortic Atherosclerosis (ICD10-I70.0) and Emphysema (ICD10-J43.9). Electronically Signed   By: Delbert Phenix M.D.   On: 09/10/2022 21:08     Assessment and plan- Patient is a 77 y.o. male with history of stage I non-small cell lung cancer involving the left upper lobe in 2023 now with biopsy-proven mediastinal recurrence.  He is here to discuss CT scan results and further management  I have reviewed CT chest abdomen pelvis images independently and discussed findings with the patient which shows decrease in the size of the biopsy-proven mediastinal lymph node.  There isA new 1.3 cm left upper lobe pulmonary nodule which at this point could be potentially inflammatory as well.  I plan to follow-up with this with a repeat CT scan in 2 months.  Given that he had mediastinal recurrence we were treating him as having a stage III lung cancer.  Typically after completion of concurrent chemoradiation standard of care is to proceed with maintenance immunotherapy and I would recommend durvalumab every 4 weeks for 1 year.  There is insufficient tissue for EGFR testing.  Pulmonary had sent blood NGS testing through [employer] assay in which EGFR was tested and patient was negative.  He would not benefit from adjuvant osimertinib therefore.  I will tentatively see him back in 2 weeks time to start cycle 1 of durvalumab.  Discussed risks and benefits of immunotherapy including all but not limited to autoimmune side effect such as colitis pneumonitis endocrinopathies as well as need to monitor kidney and liver functions.  Treatment will be given with a curative intent.  Patient understands and agrees to proceed as planned   Visit Diagnosis 1. Recurrent non-small cell lung cancer (HCC)   2. Goals of care, counseling/discussion      Dr. Owens Shark, MD, MPH [facility] [phone] 09/14/2022 11:06 AM

## 2022-09-14 NOTE — Progress Notes (Signed)
DISCONTINUE OFF PATHWAY REGIMEN - Non-Small Cell Lung   [id number]:Carboplatin AUC=2 IV D1 + Paclitaxel 45 mg/m2 IV D1 q7 Days + RT:   A cycle is every 7 days, concurrent with RT:     Paclitaxel      Carboplatin   **Always confirm dose/schedule in your pharmacy ordering system**  REASON: Other Reason PRIOR TREATMENT: Off Pathway: Carboplatin AUC=2 IV D1 + Paclitaxel 45 mg/m2 IV D1 q7 Days + RT TREATMENT RESPONSE: Partial Response (PR)  START OFF PATHWAY REGIMEN - Non-Small Cell Lung   [id number]:Durvalumab 1,500 mg IV D1 q28 Days:   A cycle is every 28 days:     Durvalumab   **Always confirm dose/schedule in your pharmacy ordering system**  Patient Characteristics: Local Recurrence Therapeutic Status: Local Recurrence Intent of Therapy: Curative Intent, Discussed with Patient

## 2022-09-14 NOTE — Progress Notes (Signed)
OFF PATHWAY REGIMEN - Non-Small Cell Lung  No Change  Continue With Treatment as Ordered.  Original Decision Date/Time: 06/30/2022 08:32   [id number]:Carboplatin AUC=2 IV D1 + Paclitaxel 45 mg/m2 IV D1 q7 Days + RT:   A cycle is every 7 days, concurrent with RT:     Paclitaxel      Carboplatin   **Always confirm dose/schedule in your pharmacy ordering system**  Patient Characteristics: Local Recurrence Therapeutic Status: Local Recurrence Intent of Therapy: Curative Intent, Discussed with Patient

## 2022-09-18 ENCOUNTER — Telehealth: Payer: Self-pay

## 2022-09-20 NOTE — Progress Notes (Signed)
Pharmacist Chemotherapy Monitoring - Initial Assessment    Anticipated start date: 09/28/22   The following has been reviewed per standard work regarding the patient's treatment regimen: The patient's diagnosis, treatment plan and drug doses, and organ/hematologic function Lab orders and baseline tests specific to treatment regimen  The treatment plan start date, drug sequencing, and pre-medications Prior authorization status  Patient's documented medication list, including drug-drug interaction screen and prescriptions for anti-emetics and supportive care specific to the treatment regimen The drug concentrations, fluid compatibility, administration routes, and timing of the medications to be used The patient's access for treatment and lifetime cumulative dose history, if applicable  The patient's medication allergies and previous infusion related reactions, if applicable   Changes made to treatment plan:  N/A  Follow up needed:  N/A   Stephens Shire, [facility], 09/20/2022  8:15 PM

## 2022-09-22 NOTE — Telephone Encounter (Signed)
error 

## 2022-09-25 ENCOUNTER — Encounter: Payer: Self-pay | Admitting: Radiation Oncology

## 2022-09-25 ENCOUNTER — Encounter: Payer: Medicare Other | Admitting: Pharmacist

## 2022-09-25 ENCOUNTER — Encounter: Payer: Self-pay | Admitting: Oncology

## 2022-09-25 ENCOUNTER — Ambulatory Visit
Admission: RE | Admit: 2022-09-25 | Discharge: 2022-09-25 | Disposition: A | Discharge status: Home / Self Care | Payer: Medicare Other | Source: Ambulatory Visit | Attending: Radiation Oncology | Admitting: Radiation Oncology

## 2022-09-25 VITALS — BP 138/82 | HR 61 | Temp 98.6°F | Resp 18 | Wt 181.3 lb

## 2022-09-25 DIAGNOSIS — C3412 Malignant neoplasm of upper lobe, left bronchus or lung: Secondary | ICD-10-CM | POA: Diagnosis not present

## 2022-09-25 NOTE — Progress Notes (Signed)
Radiation Oncology Follow up Note  Name: Antonio [name]   Date:   09/25/2022 MRN:  [id number] DOB: [birth year]    This 77 y.o. 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  REFERRING PROVIDER: Doreene Nest, NP  HPI: Patient is a 77 year old male now out 1 month having completed concurrent chemoradiation therapy for mediastinal adenopathy status post SBRT over a year ago for non-small cell lung cancer left upper lobe.  Seen today in routine follow-up he is doing well.  He specifically denies cough hemoptysis chest tightness or any change in his pulmonary status.[name]  He is slated to undergo immunotherapy next week.  COMPLICATIONS OF TREATMENT: none  FOLLOW UP COMPLIANCE: keeps appointments   PHYSICAL EXAM:  BP 138/82   Pulse 61   Temp 98.6 F (37 C)   Resp 18   Wt 181 lb 4.8 oz (82.2 kg)   SpO2 96%   BMI 28.40 kg/m  Well-developed well-nourished patient in NAD. HEENT reveals PERLA, EOMI, discs not visualized.  Oral cavity is clear. No oral mucosal lesions are identified. Neck is clear without evidence of cervical or supraclavicular adenopathy. Lungs are clear to A&P. Cardiac examination is essentially unremarkable with regular rate and rhythm without murmur rub or thrill. Abdomen is benign with no organomegaly or masses noted. Motor sensory and DTR levels are equal and symmetric in the upper and lower extremities. Cranial nerves II through XII are grossly intact. Proprioception is intact. No peripheral adenopathy or edema is identified. No motor or sensory levels are noted. Crude visual fields are within normal range.  RADIOLOGY RESULTS: No current films for review  PLAN: Present time patient is doing well low side effect profile from his concurrent therapy.  I have asked to see him back in 3 to 4 months  for follow-up.  I think he is scheduled for a PET scan prior to that we will review that at that time.  He continues close follow-up and treatment through medical oncology with immunotherapy.  Patient knows to call with any concerns.  I would like to take this opportunity to thank you for allowing me to participate in the care of your patient.Carmina Miller, MD

## 2022-09-26 DIAGNOSIS — E1165 Type 2 diabetes mellitus with hyperglycemia: Secondary | ICD-10-CM | POA: Diagnosis not present

## 2022-09-29 ENCOUNTER — Encounter: Payer: Self-pay | Admitting: Oncology

## 2022-09-29 ENCOUNTER — Inpatient Hospital Stay: Payer: Medicare Other

## 2022-09-29 ENCOUNTER — Inpatient Hospital Stay: Payer: Medicare Other | Admitting: Oncology

## 2022-09-29 VITALS — BP 160/85 | HR 64 | Temp 95.6°F | Resp 18 | Ht 67.0 in | Wt 181.4 lb

## 2022-09-29 DIAGNOSIS — C3490 Malignant neoplasm of unspecified part of unspecified bronchus or lung: Secondary | ICD-10-CM

## 2022-09-29 DIAGNOSIS — Z5112 Encounter for antineoplastic immunotherapy: Secondary | ICD-10-CM

## 2022-09-29 DIAGNOSIS — Z79899 Other long term (current) drug therapy: Secondary | ICD-10-CM | POA: Diagnosis not present

## 2022-09-29 DIAGNOSIS — C3411 Malignant neoplasm of upper lobe, right bronchus or lung: Secondary | ICD-10-CM | POA: Diagnosis not present

## 2022-09-29 DIAGNOSIS — Z79620 Long term (current) use of immunosuppressive biologic: Secondary | ICD-10-CM | POA: Diagnosis not present

## 2022-09-29 DIAGNOSIS — Z87891 Personal history of nicotine dependence: Secondary | ICD-10-CM | POA: Diagnosis not present

## 2022-09-29 LAB — CMP (CANCER CENTER ONLY)
ALT: 10 U/L (ref 0–44)
AST: 17 U/L (ref 15–41)
Albumin: 3.5 g/dL (ref 3.5–5.0)
Alkaline Phosphatase: 45 U/L (ref 38–126)
Anion gap: 8 (ref 5–15)
BUN: 19 mg/dL (ref 8–23)
CO2: 21 mmol/L — ABNORMAL LOW (ref 22–32)
Calcium: 8.4 mg/dL — ABNORMAL LOW (ref 8.9–10.3)
Chloride: 111 mmol/L (ref 98–111)
Creatinine: 1 mg/dL (ref 0.61–1.24)
GFR, Estimated: >60 mL/min (ref >60)
Glucose, Bld: 149 mg/dL — ABNORMAL HIGH (ref 70–99)
Potassium: 4.1 mmol/L (ref 3.5–5.1)
Sodium: 140 mmol/L (ref 135–145)
Total Bilirubin: 0.3 mg/dL (ref 0.3–1.2)
Total Protein: 6.4 g/dL — ABNORMAL LOW (ref 6.5–8.1)

## 2022-09-29 LAB — CBC WITH DIFFERENTIAL (CANCER CENTER ONLY)
Abs Immature Granulocytes: 0.09 10*3/uL — ABNORMAL HIGH (ref 0.00–0.07)
Basophils Absolute: 0.1 10*3/uL (ref 0.0–0.1)
Basophils Relative: 1 %
Eosinophils Absolute: 0.3 10*3/uL (ref 0.0–0.5)
Eosinophils Relative: 4 %
HCT: 32.6 % — ABNORMAL LOW (ref 39.0–52.0)
Hemoglobin: 10.7 g/dL — ABNORMAL LOW (ref 13.0–17.0)
Immature Granulocytes: 1 %
Lymphocytes Relative: 10 %
Lymphs Abs: 0.6 10*3/uL — ABNORMAL LOW (ref 0.7–4.0)
MCH: 31.2 pg (ref 26.0–34.0)
MCHC: 32.8 g/dL (ref 30.0–36.0)
MCV: 95 fL (ref 80.0–100.0)
Monocytes Absolute: 0.6 10*3/uL (ref 0.1–1.0)
Monocytes Relative: 9 %
Neutro Abs: 4.7 10*3/uL (ref 1.7–7.7)
Neutrophils Relative %: 75 %
Platelet Count: 269 10*3/uL (ref 150–400)
RBC: 3.43 MIL/uL — ABNORMAL LOW (ref 4.22–5.81)
RDW: 18.2 % — ABNORMAL HIGH (ref 11.5–15.5)
WBC Count: 6.2 10*3/uL (ref 4.0–10.5)
nRBC: 0 % (ref 0.0–0.2)

## 2022-09-29 LAB — TSH: TSH: 1.254 u[IU]/mL (ref 0.350–4.500)

## 2022-09-29 MED ORDER — SODIUM CHLORIDE 0.9 % IV SOLN
1500.0000 mg | Freq: Once | INTRAVENOUS | Status: AC
  Administered 2022-09-29: 1500 mg via INTRAVENOUS
  Filled 2022-09-29: qty 30

## 2022-09-29 MED ORDER — HEPARIN SOD (PORK) LOCK FLUSH 100 UNIT/ML IV SOLN
500.0000 [IU] | Freq: Once | INTRAVENOUS | Status: AC | PRN
  Administered 2022-09-29: 500 [IU]
  Filled 2022-09-29: qty 5

## 2022-09-29 MED ORDER — SODIUM CHLORIDE 0.9 % IV SOLN
Freq: Once | INTRAVENOUS | Status: AC
  Filled 2022-09-29: qty 250

## 2022-09-29 NOTE — Progress Notes (Signed)
Hematology/Oncology Consult note [facility]  Telephone:([phone] [phone]  Patient Care Team: Doreene Nest, NP as PCP - General (Internal Medicine) Debbe Odea, MD as PCP - Cardiology (Cardiology) Duke Salvia, MD as PCP - Electrophysiology (Cardiology) Harle Battiest PA-C as Physician Assistant (Urology) Meryl Dare, MD as Consulting Physician (Gastroenterology) Kathyrn Sheriff, [facility] (Inactive) as Pharmacist (Pharmacist) Glory Buff, RN as Oncology Nurse Navigator Creig Hines, MD as Consulting Physician (Oncology)   Name of the patient: Antonio [name]  [id number]  10-05-1945   Date of visit: 09/29/22  Diagnosis- locally recurrent adenocarcinoma of the lung   Chief complaint/ Reason for visit-on treatment assessment prior to cycle 1 of maintenance durvalumab  Heme/Onc history: patient is a 77 year old male who was diagnosed with stage I non-small cell lung cancer involving the left upper lobe about 1 year ago and he received SBRT for the same. He has been undergoing surveillance CT scans since then. CT chest in April 2024 showed increasing areas of disease with left-sided mediastinal and hilar lymph nodes and soft tissue along the left main bronchus. 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 No residual focal hypermetabolism in the treated area of the left upper lobe. No evidence of distant metastatic disease. He underwent MRI brain without contrast in February 2024 which did not show any evidence of metastatic disease. Patient was seen by Dr. Jayme Cloud and underwent EBUS guided biopsies left paratracheal lymph node was biopsied and consistent with pulmonary adenocarcinoma.  Patient completed concurrent chemoradiation with weekly CarboTaxol chemotherapy in July 2024.  Not enough tissue available for NGS testing.   Circulogene assay was sent by pulmonary and peripheral blood NGS testing did not show any evidence of EGFR or ALK mutation  Interval history-patient is doing well and denies any specific complaints at this time. He denies any pain or difficulty with swallowing  ECOG PS- 1 Pain scale- 0   Review of systems- Review of Systems  Constitutional:  Negative for chills, fever, malaise/fatigue and weight loss.  HENT:  Negative for congestion, ear discharge and nosebleeds.   Eyes:  Negative for blurred vision.  Respiratory:  Negative for cough, hemoptysis, sputum production, shortness of breath and wheezing.   Cardiovascular:  Negative for chest pain, palpitations, orthopnea and claudication.  Gastrointestinal:  Negative for abdominal pain, blood in stool, constipation, diarrhea, heartburn, melena, nausea and vomiting.  Genitourinary:  Negative for dysuria, flank pain, frequency, hematuria and urgency.  Musculoskeletal:  Negative for back pain, joint pain and myalgias.  Skin:  Negative for rash.  Neurological:  Negative for dizziness, tingling, focal weakness, seizures, weakness and headaches.  Endo/Heme/Allergies:  Does not bruise/bleed easily.  Psychiatric/Behavioral:  Negative for depression and suicidal ideas. The patient does not have insomnia.       No Known Allergies   Past Medical History:  Diagnosis Date   Adenoma of left adrenal gland    Anemia    Aortic atherosclerosis (HCC)    Arthritis    Atrial fibrillation and flutter (HCC)    a.) CHA2DS2VASc = 7 (age x 2, HTN, CVA x 2, vascular disease history, T2DM);  b.) rate/rhythm maintained without pharmacological intervention; chronic antithrombotic therapy using ASA + clopidogrel   Bilateral carotid artery stenosis    a.) s/p PTA 04/07/2022 --> 90% RICA --> 9x7x30 Exact stent; b.) doppler 03/14/20214: 1-39% RICA, 40-59% LICA  BPH with urinary obstruction    stable on flomax ([name]   Childhood asthma    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   a.) CT head and MRI brain 04/05/2022: chronic cortical/subcortical RIGHT MCA territory infarct within the RIGHT parietal lobe and RIGHT temporoparietal junction   Coronary atherosclerosis of unspecified type of vessel, native or graft    a.) cCTA 05/22/2021: Ca score 1524 (84th percentile for age/sex match control) --> FFRct: dLAD = 0.77, dLCx = 0.67, RPLA = 0.77, dOM1 = 0.66; b.) LHC 05/23/2021: 20% dRCA, 50% OM1 - med mgmt   Diastolic dysfunction    a.) TTE 04/06/2022: EF 60-65%, mod LVH, mild MR, AoV sclerosis, G1DD   DOE (dyspnea on exertion)    Emphysema of lung (HCC)    Erectile dysfunction    a.) on PDE5i (sildenafil)   Esophageal stricture    Fall    Gastric AVM    Gastritis    GERD (gastroesophageal reflux disease)    Hiatal hernia    Hx of colonic polyps    Hyperlipidemia    Hypertension    Internal hemorrhoids without mention of complication    Ischemic leg    Long term current use of antithrombotics/antiplatelets    a.) on DAPT (ASA + clopidogrel)   Lumbago    Mobitz (type) I (Wenckebach's) atrioventricular block    Non-small cell lung cancer (HCC)    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   a.) PCR (+) 05/29/2018, 04/07/2022   Pneumonia    PSA elevation    now averaging 2's   PVD (peripheral vascular disease) with claudication (HCC)    Sigmoid diverticulosis    Tobacco abuse    Type 2 diabetes mellitus treated with insulin [facility])      Past Surgical History:  Procedure Laterality Date   2D Echo  07/2001   BACK SURGERY     CAROTID PTA/STENT INTERVENTION Right 04/07/2022   Procedure: CAROTID PTA/STENT INTERVENTION;  Surgeon: Renford Dills, MD;  Location: [facility] INVASIVE CV LAB;  Service: Cardiovascular;  Laterality: Right;   COLONOSCOPY     ENDOBRONCHIAL ULTRASOUND Left 06/19/2022   Procedure: ENDOBRONCHIAL ULTRASOUND;  Surgeon: Salena Saner, MD;  Location: [facility] ORS;  Service: Pulmonary;  Laterality: Left;    ESOPHAGOGASTRODUODENOSCOPY  02/13/2007   gastritis and duodenitis without bleed   ESOPHAGOGASTRODUODENOSCOPY N/A 06/26/2014   Procedure: ESOPHAGOGASTRODUODENOSCOPY (EGD);  Surgeon: Rachael Fee, MD;  Location: [facility] ENDOSCOPY;  Service: Endoscopy;  Laterality: N/A;   ESOPHAGOGASTRODUODENOSCOPY (EGD) WITH PROPOFOL N/A 04/08/2020   Procedure: ESOPHAGOGASTRODUODENOSCOPY (EGD) WITH PROPOFOL;  Surgeon: Meryl Dare, MD;  Location: [facility] ENDOSCOPY;  Service: Endoscopy;  Laterality: N/A;   FLEXIBLE BRONCHOSCOPY Left 06/19/2022   Procedure: FLEXIBLE BRONCHOSCOPY;  Surgeon: Salena Saner, MD;  Location: [facility] ORS;  Service: Pulmonary;  Laterality: Left;   HOT HEMOSTASIS N/A 04/08/2020   Procedure: HOT HEMOSTASIS (ARGON PLASMA COAGULATION/BICAP);  Surgeon: Meryl Dare, MD;  Location: [facility] ENDOSCOPY;  Service: Endoscopy;  Laterality: N/A;   IR IMAGING GUIDED PORT INSERTION  06/30/2022   KNEE ARTHROSCOPY Right 11/1998   LEFT HEART CATH AND CORONARY ANGIOGRAPHY N/A 05/23/2021   Procedure: LEFT HEART CATH AND CORONARY ANGIOGRAPHY;  Surgeon: Orbie Pyo, MD;  Location: [facility] INVASIVE CV LAB;  Service: Cardiovascular;  Laterality: N/A;   LOWER EXTREMITY ANGIOGRAPHY Right 12/27/2017   Procedure: LOWER EXTREMITY ANGIOGRAPHY;  Surgeon: Annice Needy, MD;  Location: [facility] INVASIVE CV LAB;  Service: Cardiovascular;  Laterality: Right;   LOWER EXTREMITY  ANGIOGRAPHY Left 01/10/2018   Procedure: LOWER EXTREMITY ANGIOGRAPHY;  Surgeon: Annice Needy, MD;  Location: [facility] INVASIVE CV LAB;  Service: Cardiovascular;  Laterality: Left;   LOWER EXTREMITY ANGIOGRAPHY Left 05/29/2018   Procedure: LOWER EXTREMITY ANGIOGRAPHY;  Surgeon: Annice Needy, MD;  Location: [facility] INVASIVE CV LAB;  Service: Cardiovascular;  Laterality: Left;   LOWER EXTREMITY ANGIOGRAPHY Left 05/30/2018   Procedure: Lower Extremity Angiography;  Surgeon: Annice Needy, MD;  Location: [facility] INVASIVE CV LAB;  Service: Cardiovascular;  Laterality: Left;    LP  07/2001   Microwave thermotherapy prostate  08/28/2007   [place]   Persantine cardiolite  04/1999   EF 68%   POLYPECTOMY  12/1993   Stress myoview  08/2004   EF 67%   UPPER GASTROINTESTINAL ENDOSCOPY      Social History   Socioeconomic History   Marital status: Widowed    Spouse name: Armando Reichert   Number of children: 3   Years of education: Not on file   Highest education level: Not on file  Occupational History   Occupation: Retired Music therapist  Tobacco Use   Smoking status: Former    Current packs/day: 0.00    Average packs/day: 2.0 packs/day for 57.3 years (114.5 ttl pk-yrs)    Types: Cigarettes    Start date: 02/07/1964    Quit date: 05/11/2021    Years since quitting: 1.3   Smokeless tobacco: Former    Types: Chew   Tobacco comments:    Quit one year ago  Vaping Use   Vaping status: Never Used  Substance and Sexual Activity   Alcohol use: Yes    Comment: rare   Drug use: No   Sexual activity: Yes  Other Topics Concern   Not on file  Social History Narrative   Married 9/09 wife with moderate memory problems.   3 adult children, 2 grandchildren   Would desire CPR   Social Determinants of Health   Financial Resource Strain: Low Risk  (01/24/2022)   Overall Financial Resource Strain (CARDIA)    Difficulty of Paying Living Expenses: Not hard at all  Food Insecurity: No Food Insecurity (06/26/2022)   Hunger Vital Sign    Worried About Running Out of Food in the Last Year: Never true    Ran Out of Food in the Last Year: Never true  Transportation Needs: No Transportation Needs (06/26/2022)   PRAPARE - Administrator, Civil Service (Medical): No    Lack of Transportation (Non-Medical): No  Physical Activity: Insufficiently Active (01/24/2022)   Exercise Vital Sign    Days of Exercise per Week: 3 days    Minutes of Exercise per Session: 30 min  Stress: No Stress Concern Present (01/24/2022)   [employer] of Occupational Health - Occupational Stress  Questionnaire    Feeling of Stress : Not at all  Social Connections: Moderately Isolated (01/24/2022)   Social Connection and Isolation Panel [NHANES]    Frequency of Communication with Friends and Family: More than three times a week    Frequency of Social Gatherings with Friends and Family: Once a week    Attends Religious Services: More than 4 times per year    Active Member of [employer] or Organizations: No    Attends Banker Meetings: Never    Marital Status: Widowed  Intimate Partner Violence: Not At Risk (06/26/2022)   Humiliation, Afraid, Rape, and Kick questionnaire    Fear of Current or Ex-Partner: No    Emotionally Abused:  No    Physically Abused: No    Sexually Abused: No    Family History  Problem Relation Age of Onset   Rheum arthritis Mother    Diabetes Father    Alcohol abuse Paternal Uncle    Alcohol abuse Paternal Uncle    Alcohol abuse Paternal Uncle    Heart disease Neg Hx    Stroke Neg Hx    Cancer Neg Hx    Drug abuse Neg Hx    Depression Neg Hx    Colon cancer Neg Hx    Rectal cancer Neg Hx    Stomach cancer Neg Hx    Kidney cancer Neg Hx    Bladder Cancer Neg Hx    Prostate cancer Neg Hx      Current Outpatient Medications:    acetaminophen (TYLENOL) 500 MG tablet, Take 1,000 mg by mouth every 6 (six) hours. Rapid release, Disp: , Rfl:    atorvastatin (LIPITOR) 20 MG tablet, TAKE 1 TABLET BY MOUTH IN THE EVENING FOR CHOLESTEROL (Patient taking differently: Take 20 mg by mouth in the morning.), Disp: 90 tablet, Rfl: 1   clopidogrel (PLAVIX) 75 MG tablet, Take 1 tablet (75 mg total) by mouth daily., Disp: 90 tablet, Rfl: 3   ferrous gluconate (FERGON) 324 MG tablet, TAKE 1 TABLET BY MOUTH EVERY DAY WITH BREAKFAST (Patient taking differently: Take 324 mg by mouth daily with breakfast.), Disp: 30 tablet, Rfl: 0   FIBER PO, Take 1 capsule by mouth in the morning., Disp: , Rfl:    finasteride (PROSCAR) 5 MG tablet, Take 1 tablet (5 mg total) by  mouth daily. (Patient taking differently: Take 5 mg by mouth every morning.), Disp: 90 tablet, Rfl: 3   Fluticasone-Umeclidin-Vilant (TRELEGY ELLIPTA) 100-62.5-25 MCG/ACT AEPB, Inhale 1 puff into the lungs daily., Disp: 28 each, Rfl: 11   glipiZIDE (GLUCOTROL) 10 MG tablet, Take 10 mg by mouth daily before breakfast., Disp: , Rfl:    GLUCOSAMINE HCL PO, Take 1 tablet by mouth daily., Disp: , Rfl:    Insulin Glargine (BASAGLAR KWIKPEN) 100 UNIT/ML, Inject 30 Units into the skin every morning., Disp: , Rfl:    lisinopril (ZESTRIL) 20 MG tablet, Take 1 tablet (20 mg total) by mouth daily. (Patient taking differently: Take 20 mg by mouth daily as needed. Patient reports taking if systolic over 160), Disp: 90 tablet, Rfl: 1   metFORMIN (GLUCOPHAGE) 500 MG tablet, TAKE 2 TABLETS BY MOUTH TWICE A DAY WITH MEAL FOR DIABETES (Patient taking differently: Take 1,000 mg by mouth 2 (two) times daily. TAKE 2 TABLETS BY MOUTH TWICE A DAY WITH MEAL FOR DIABETES), Disp: 360 tablet, Rfl: 2   pantoprazole (PROTONIX) 40 MG tablet, TAKE 1 TABLET (40 MG TOTAL) BY MOUTH DAILY. FOR HEARTBURN, Disp: 90 tablet, Rfl: 0   sildenafil (VIAGRA) 100 MG tablet, Take 1 tablet (100 mg total) by mouth daily as needed for erectile dysfunction., Disp: 30 tablet, Rfl: 0   tamsulosin (FLOMAX) 0.4 MG CAPS capsule, Take 1 capsule (0.4 mg total) by mouth 2 (two) times daily., Disp: 180 capsule, Rfl: 3   vitamin B-12 (CYANOCOBALAMIN) 1000 MCG tablet, Take 1,000 mcg by mouth in the morning., Disp: , Rfl:   Physical exam:  Vitals:   09/29/22 0856 09/29/22 0901  BP: (!) 166/75 (!) 160/85  Pulse: 60 64  Resp: 18   Temp: (!) 95.6 F (35.3 C)   TempSrc: Tympanic   SpO2: 98%   Weight: 181 lb 6.4 oz (82.3 kg)  Height: 5\' 7"  (1.702 m)    Physical Exam Cardiovascular:     Rate and Rhythm: Normal rate and regular rhythm.     Heart sounds: Normal heart sounds.  Pulmonary:     Effort: Pulmonary effort is normal.     Breath sounds: Normal  breath sounds.  Skin:    General: Skin is warm and dry.  Neurological:     Mental Status: He is alert and oriented to person, place, and time.         Latest Ref Rng & Units 09/29/2022    8:47 AM  CMP  Glucose 70 - 99 mg/dL 161   BUN 8 - 23 mg/dL 19   Creatinine 0.96 - 1.24 mg/dL 0.45   Sodium 409 - 811 mmol/L 140   Potassium 3.5 - 5.1 mmol/L 4.1   Chloride 98 - 111 mmol/L 111   CO2 22 - 32 mmol/L 21   Calcium 8.9 - 10.3 mg/dL 8.4   Total Protein 6.5 - 8.1 g/dL 6.4   Total Bilirubin 0.3 - 1.2 mg/dL 0.3   Alkaline Phos 38 - 126 U/L 45   AST 15 - 41 U/L 17   ALT 0 - 44 U/L 10       Latest Ref Rng & Units 09/29/2022    8:47 AM  CBC  WBC 4.0 - 10.5 K/uL 6.2   Hemoglobin 13.0 - 17.0 g/dL 91.4   Hematocrit 78.2 - 52.0 % 32.6   Platelets 150 - 400 K/uL 269     No images are attached to the encounter.  CT CHEST W CONTRAST  Result Date: 09/10/2022 CLINICAL DATA:  Recurrent non-small-cell left lung cancer. Restaging. * Tracking Code: BO * EXAM: CT CHEST WITH CONTRAST TECHNIQUE: Multidetector CT imaging of the chest was performed during intravenous contrast administration. RADIATION DOSE REDUCTION: This exam was performed according to the departmental dose-optimization program which includes automated exposure control, adjustment of the mA and/or kV according to patient size and/or use of iterative reconstruction technique. CONTRAST:  75mL OMNIPAQUE IOHEXOL 300 MG/ML  SOLN COMPARISON:  05/30/2022 PET-CT.  05/11/2022 chest CT. FINDINGS: Cardiovascular: Normal heart size. No significant pericardial effusion/thickening. Three-vessel coronary atherosclerosis. Right internal jugular Port-A-Cath terminates in the lower third of the SVC. Atherosclerotic nonaneurysmal thoracic aorta. Normal caliber pulmonary arteries. No central pulmonary emboli. Mediastinum/Nodes: No significant thyroid nodules. Generalized circumferential mild thoracic esophageal wall thickening, unchanged. No axillary  adenopathy. Mildly enlarged 1.1 cm low left paratracheal node (series 2/image 51), decreased from 1.4 cm on 05/11/2022 CT. Mildly enlarged 1.1 cm high left paratracheal node (series 2/image 34), decreased from 1.3 cm. No pathologically enlarged hilar nodes. Lungs/Pleura: No pneumothorax. No pleural effusion. Thick bandlike consolidation in the posterior left upper lobe with associated volume loss, not appreciably changed, compatible with post treatment change. New poorly marginated anterior left upper lobe 1.3 x 0.8 cm pulmonary nodule (series 4/image 46). No additional significant pulmonary nodules. Mild centrilobular emphysema. Upper abdomen: Left adrenal 2.8 cm nodule with density 21 HU, stable, previously characterized as a benign adenoma. Musculoskeletal: No aggressive appearing focal osseous lesions. Moderate thoracic spondylosis. IMPRESSION: 1. New poorly marginated anterior left upper lobe 1.3 x 0.8 cm pulmonary nodule, cannot exclude new pulmonary metastasis versus inflammatory lesion. Suggest attention on short-term follow-up chest CT in 3 months. PET-CT could be obtained in 2-4 weeks for further characterization if clinically warranted. 2. Left paratracheal nodal metastases are mildly decreased. 3. Stable post treatment change in the posterior left upper lobe. 4. Three-vessel coronary  atherosclerosis. 5. Stable left adrenal adenoma, for which no follow-up imaging is recommended. 6. Chronic generalized circumferential mild thoracic esophageal wall thickening, nonspecific, consider reflux esophagitis or post treatment change. 7. Aortic Atherosclerosis (ICD10-I70.0) and Emphysema (ICD10-J43.9). Electronically Signed   By: Delbert Phenix M.D.   On: 09/10/2022 21:08     Assessment and plan- Patient is a 77 y.o. male with history of stage I non-small cell lung cancer involving the left upper lobe in 2023 now with biopsy-proven mediastinal recurrence.  He is s/p concurrent chemoradiation with weekly CarboTaxol  chemotherapy and partial response.  He is here for on treatment assessment prior to cycle 1 of maintenance durvalumab  Counts okay to proceed with cycle 1 of maintenance durvalumab today and I will see him back in 4 weeks for cycle 2.  Discussed risks and benefits of immunotherapy including all but not limited to possible autoimmune side effect such as colitis pneumonitis endocrinopathies and the need to monitor kidney and liver functions.  Treatment will be given with a curative intent.  Patient understands and agrees to proceed as planned.  I will plan to get repeat CT scan after 3 treatments to follow-up on the new left upper lobe pulmonary nodule as well.   Visit Diagnosis 1. Recurrent non-small cell lung cancer (HCC)   2. Encounter for antineoplastic immunotherapy      Dr. Owens Shark, MD, MPH [facility] at [facility] [phone] 09/29/2022 9:16 AM

## 2022-09-29 NOTE — Patient Instructions (Signed)
[facility]  Discharge Instructions: Thank you for choosing [facility] to provide your oncology and hematology care.  If you have a lab appointment with the Cancer Center, please go directly to the Cancer Center and check in at the registration area.  Wear comfortable clothing and clothing appropriate for easy access to any Portacath or PICC line.   We strive to give you quality time with your provider. You may need to reschedule your appointment if you arrive late (15 or more minutes).  Arriving late affects you and other patients whose appointments are after yours.  Also, if you miss three or more appointments without notifying the office, you may be dismissed from the clinic at the provider's discretion.      For prescription refill requests, have your pharmacy contact our office and allow 72 hours for refills to be completed.    To help prevent nausea and vomiting after your treatment, we encourage you to take your nausea medication as directed.  BELOW ARE SYMPTOMS THAT SHOULD BE REPORTED IMMEDIATELY: *FEVER GREATER THAN 100.4 F (38 C) OR HIGHER *CHILLS OR SWEATING *NAUSEA AND VOMITING THAT IS NOT CONTROLLED WITH YOUR NAUSEA MEDICATION *UNUSUAL SHORTNESS OF BREATH *UNUSUAL BRUISING OR BLEEDING *URINARY PROBLEMS (pain or burning when urinating, or frequent urination) *BOWEL PROBLEMS (unusual diarrhea, constipation, pain near the anus) TENDERNESS IN MOUTH AND THROAT WITH OR WITHOUT PRESENCE OF ULCERS (sore throat, sores in mouth, or a toothache) UNUSUAL RASH, SWELLING OR PAIN   Items with * indicate a potential emergency and should be followed up as soon as possible or go to the Emergency Department if any problems should occur.  Please show the CHEMOTHERAPY ALERT CARD or IMMUNOTHERAPY ALERT CARD at check-in to the Emergency Department and triage nurse.  Should you have questions after your visit or need to cancel or reschedule your  appointment, please contact [facility]  [phone] and follow the prompts.  Office hours are 8:00 a.m. to 4:30 p.m. Monday - Friday. Please note that voicemails left after 4:00 p.m. may not be returned until the following business day.  We are closed weekends and major holidays. You have access to a nurse at all times for urgent questions. Please call the main number to the clinic [phone] and follow the prompts.  For any non-urgent questions, you may also contact your provider using MyChart. We now offer e-Visits for anyone 18 and older to request care online for non-urgent symptoms. For details visit [email or web address].   Also download the MyChart app! Go to the app store, search "MyChart", open the app, select [facility], and log in with your MyChart username and password.    

## 2022-09-30 LAB — T4: T4, Total: 8.5 ug/dL (ref 4.5–12.0)

## 2022-10-02 ENCOUNTER — Other Ambulatory Visit: Payer: Self-pay | Admitting: Family Medicine

## 2022-10-02 DIAGNOSIS — I10 Essential (primary) hypertension: Secondary | ICD-10-CM

## 2022-10-03 IMAGING — CT CT CHEST LUNG CANCER SCREENING LOW DOSE W/O CM
2 of 5 series · 15 of 40 positions shown, 18 images · non-contrast
Comparison: 10/08/2019.

CLINICAL DATA: Current smoker, 87 pack-year history.



[Series 3: lung 1.00 · axial · 0.66mm/px · z∈[-1166,-872]mm · 12 of 324 slices shown, 15 images]
[im 15/324  mediastinal]
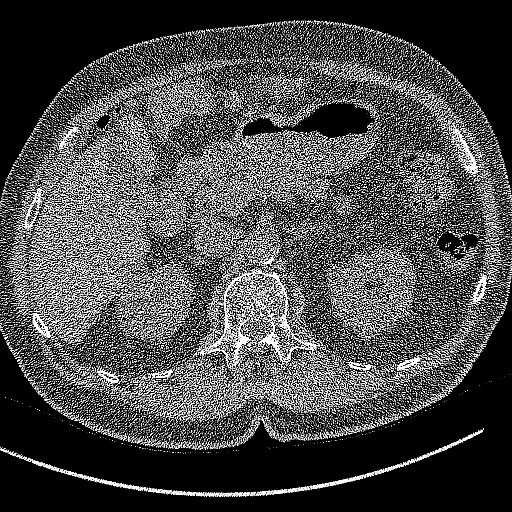
[im 15/324  lung]
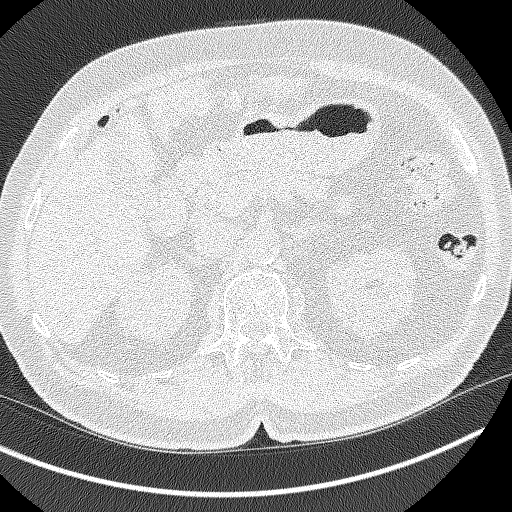
[im 45/324  lung]
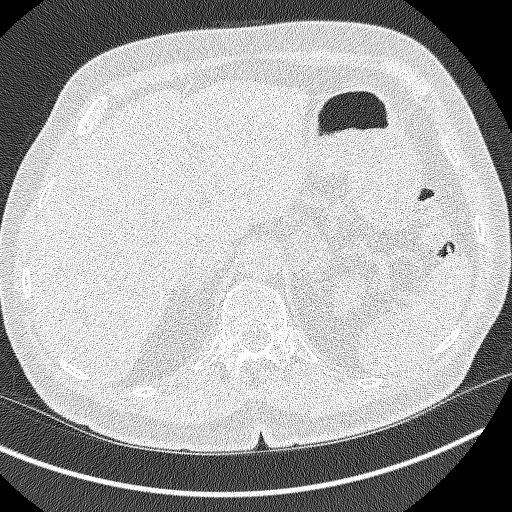
[im 74/324  lung]
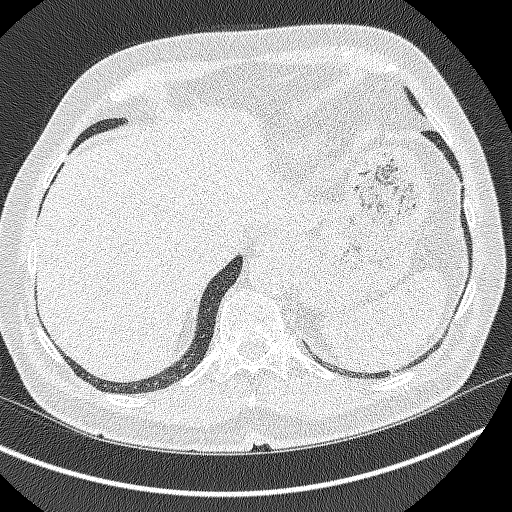
[im 103/324  lung]
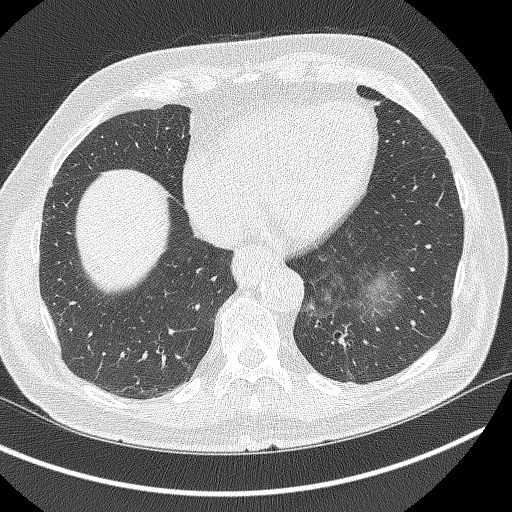
[im 118/324  mediastinal]
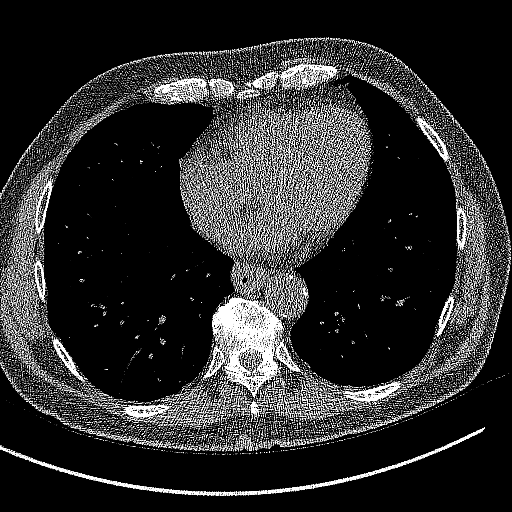
[im 118/324  lung]
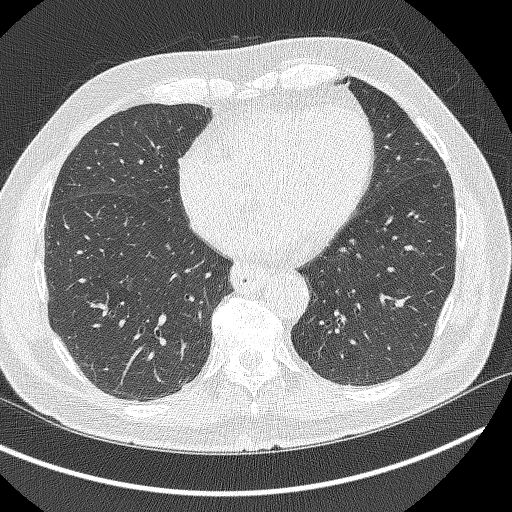
[im 147/324  lung]
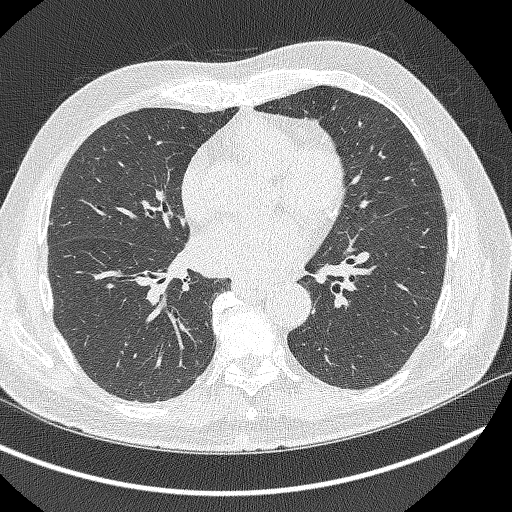
[im 177/324  lung]
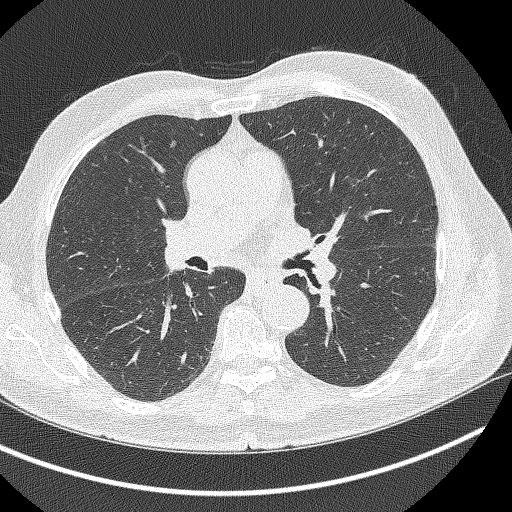
[im 206/324  lung]
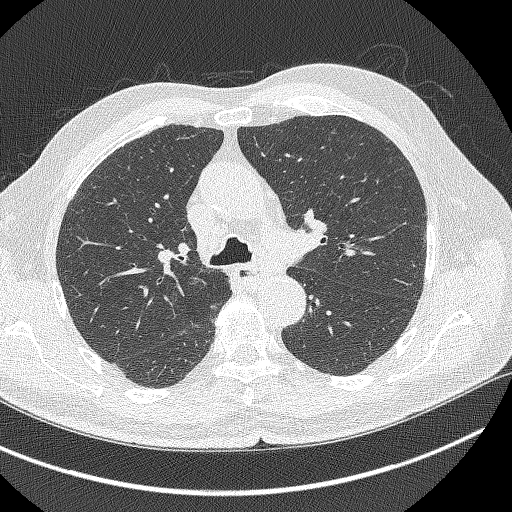
[im 221/324  mediastinal]
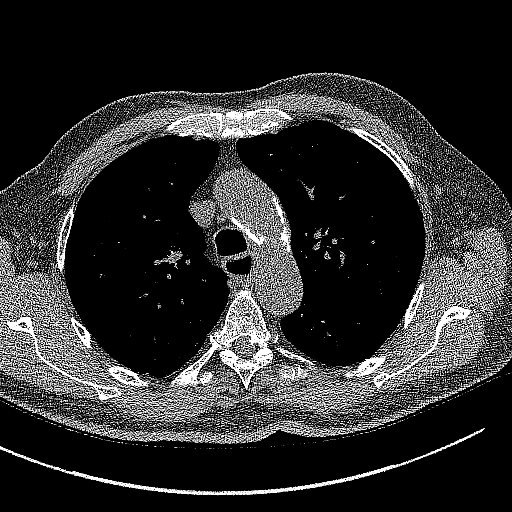
[im 221/324  lung]
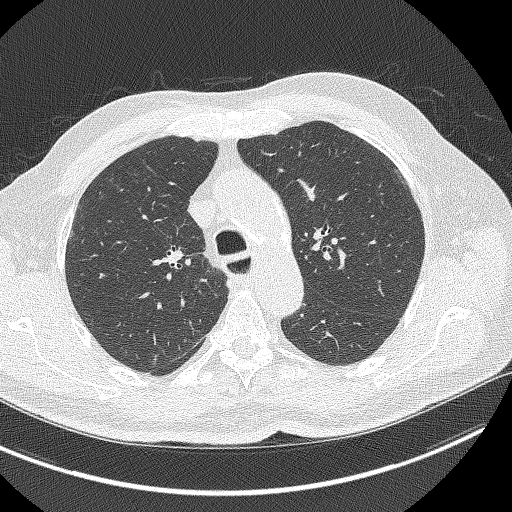
[im 250/324  lung]
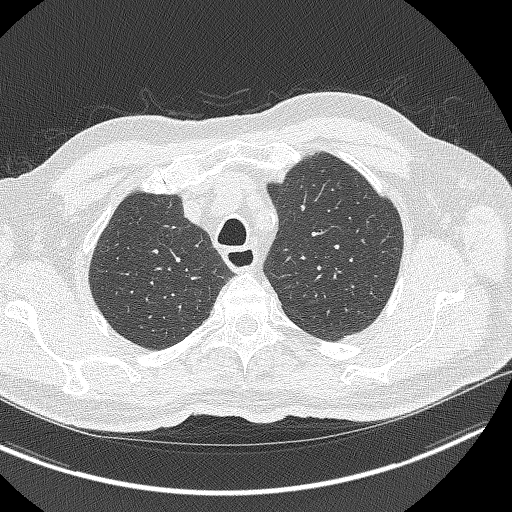
[im 279/324  lung]
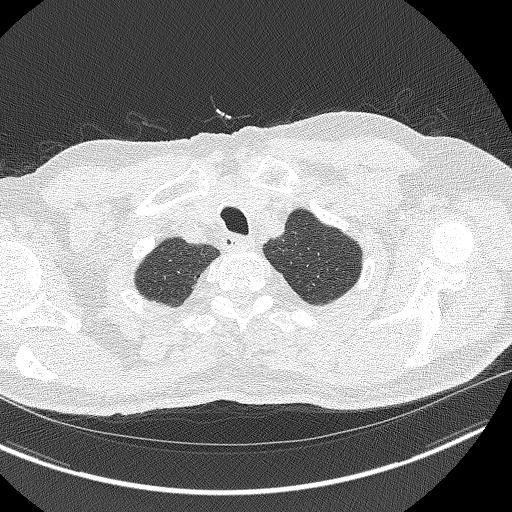
[im 309/324  lung]
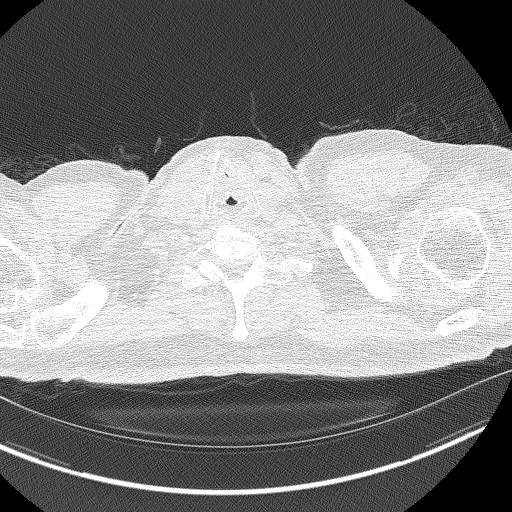

[Series 5: coronals lung 1.00 cor · coronal · 0.63mm/px · 3 of 285 slices shown]
[im 57/285  lung]
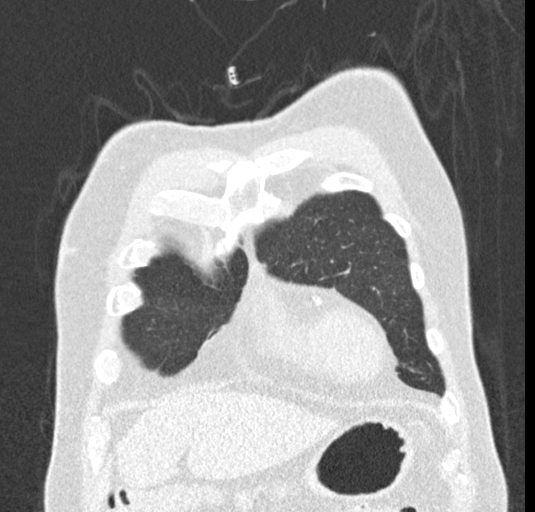
[im 114/285  lung]
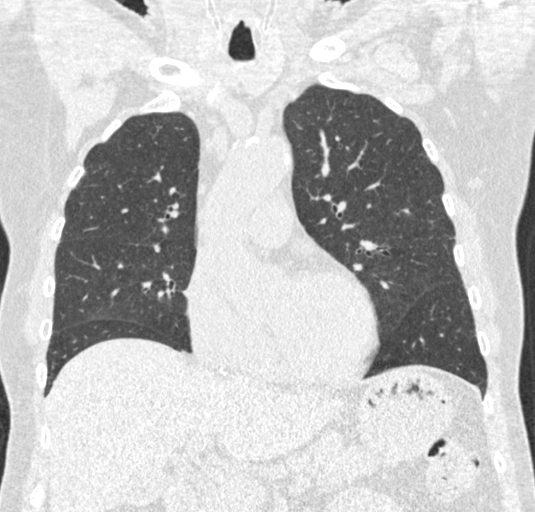
[im 171/285  lung]
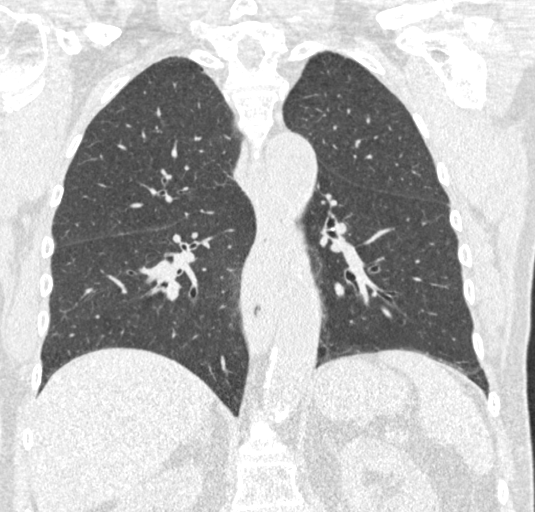

[15 of 40 positions shown; findings below may reference images not displayed]

FINDINGS: Cardiovascular: Atherosclerotic calcification of the aorta, aortic
valve and coronary arteries. Heart size normal. No pericardial
effusion.

Mediastinum/Nodes: No pathologically enlarged mediastinal or
axillary lymph nodes. Hilar regions are difficult to definitively
evaluate without IV contrast. Upper esophagus is dilated and there
is lower esophageal wall thickening, as on 10/08/2019.

Lungs/Pleura: Centrilobular and paraseptal emphysema. Smoking
related respiratory bronchiolitis. A nodular lesion in the perihilar
left upper lobe does not segment well in DynaCAD lung software. 2D
measurements are 0.6 x 1.1 cm (3/140-144). No additional pulmonary
nodules. Mild basilar scarring. No pleural fluid. Airway is
unremarkable.

Upper Abdomen: Visualized portions of the liver, gallbladder and
right adrenal gland are unremarkable. Low-attenuation left adrenal
mass measures 3.4 cm. Visualized portions the kidneys, spleen,
pancreas, stomach and bowel are grossly unremarkable.

Musculoskeletal: Degenerative changes in the spine. No worrisome
lytic or sclerotic lesions.
IMPRESSION: 1. Lung-RADS 4B, suspicious. Additional imaging evaluation or
consultation with Pulmonology or Thoracic Surgery recommended.

0.6 x 1.1 cm nodule in the perihilar left upper lobe.

These results will be called to the ordering clinician or
representative by the Radiologist Assistant, and communication
documented in the PACS or [REDACTED].
2. Left adrenal adenoma.
3. Persistent upper esophageal dilatation with lower esophageal wall
thickening. Please correlate clinically.
4. Aortic atherosclerosis ([id number].U). Coronary artery
calcification.
5.  Emphysema ([id number]).

## 2022-10-03 NOTE — Telephone Encounter (Signed)
Patient is due for follow up in November. Please schedule, thank you!

## 2022-10-04 NOTE — Telephone Encounter (Signed)
Patient has been scheduled

## 2022-10-11 DIAGNOSIS — H60332 Swimmer's ear, left ear: Secondary | ICD-10-CM | POA: Diagnosis not present

## 2022-10-11 DIAGNOSIS — H6122 Impacted cerumen, left ear: Secondary | ICD-10-CM | POA: Diagnosis not present

## 2022-10-18 ENCOUNTER — Ambulatory Visit: Payer: Medicare Other | Admitting: Cardiology

## 2022-10-20 DIAGNOSIS — E1165 Type 2 diabetes mellitus with hyperglycemia: Secondary | ICD-10-CM | POA: Diagnosis not present

## 2022-10-20 DIAGNOSIS — R809 Proteinuria, unspecified: Secondary | ICD-10-CM | POA: Diagnosis not present

## 2022-10-20 DIAGNOSIS — Z794 Long term (current) use of insulin: Secondary | ICD-10-CM | POA: Diagnosis not present

## 2022-10-20 DIAGNOSIS — E1142 Type 2 diabetes mellitus with diabetic polyneuropathy: Secondary | ICD-10-CM | POA: Diagnosis not present

## 2022-10-20 DIAGNOSIS — E1159 Type 2 diabetes mellitus with other circulatory complications: Secondary | ICD-10-CM | POA: Diagnosis not present

## 2022-10-20 DIAGNOSIS — E1129 Type 2 diabetes mellitus with other diabetic kidney complication: Secondary | ICD-10-CM | POA: Diagnosis not present

## 2022-10-23 ENCOUNTER — Ambulatory Visit (INDEPENDENT_AMBULATORY_CARE_PROVIDER_SITE_OTHER): Payer: Medicare Other | Admitting: Vascular Surgery

## 2022-10-23 ENCOUNTER — Ambulatory Visit (INDEPENDENT_AMBULATORY_CARE_PROVIDER_SITE_OTHER): Payer: Medicare Other

## 2022-10-23 ENCOUNTER — Encounter (INDEPENDENT_AMBULATORY_CARE_PROVIDER_SITE_OTHER): Payer: Self-pay | Admitting: Vascular Surgery

## 2022-10-23 VITALS — BP 173/81 | HR 72 | Resp 18 | Ht 66.0 in | Wt 182.6 lb

## 2022-10-23 DIAGNOSIS — I6523 Occlusion and stenosis of bilateral carotid arteries: Secondary | ICD-10-CM

## 2022-10-23 DIAGNOSIS — E7800 Pure hypercholesterolemia, unspecified: Secondary | ICD-10-CM

## 2022-10-23 DIAGNOSIS — I739 Peripheral vascular disease, unspecified: Secondary | ICD-10-CM

## 2022-10-23 DIAGNOSIS — E1165 Type 2 diabetes mellitus with hyperglycemia: Secondary | ICD-10-CM

## 2022-10-23 DIAGNOSIS — I2510 Atherosclerotic heart disease of native coronary artery without angina pectoris: Secondary | ICD-10-CM | POA: Diagnosis not present

## 2022-10-23 DIAGNOSIS — Z794 Long term (current) use of insulin: Secondary | ICD-10-CM | POA: Diagnosis not present

## 2022-10-23 DIAGNOSIS — I10 Essential (primary) hypertension: Secondary | ICD-10-CM

## 2022-10-23 LAB — VAS US ABI WITH/WO TBI
Left ABI: 0.77
Right ABI: 0.86

## 2022-10-23 NOTE — Progress Notes (Unsigned)
Cardiology Office Note Date:  10/24/2022  Patient ID:  Antonio [name], Antonio [name] 1945-07-26, MRN [id number] PCP:  Doreene Nest, NP  Cardiologist:  Debbe Odea, MD Electrophysiologist: Sherryl Manges, MD    Chief Complaint: bradycardia  History of Present Illness: Antonio [name] is a 77 y.o. 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  He last saw Dr. Graciela Husbands 04/2022, continued to be bradycardic but did not correlate with symptoms. Was HTN in office, but with orthostatic symptoms, did not change medications.   On follow-up today, he is feeling much better. He has complete chemo and radiation for his lung cancer, continues with immunotherapy. Sense of taste has returned to normal and his appetite is improving. Has gained some weight back. He has not had any further episodes of syncope, presyncope, lightheadedness or dizziness. Continues to have orthostatic symptoms, is an effort to constantly remind himself that he can't get up and go - has to sit on the edge of bed before getting up.    Past Medical History:  Diagnosis Date   Adenoma of left adrenal gland    Anemia    Aortic atherosclerosis (HCC)    Arthritis    Atrial fibrillation and flutter (HCC)    a.) CHA2DS2VASc = 7 (age x 2, HTN, CVA x 2, vascular disease history, T2DM);  b.) rate/rhythm maintained without pharmacological intervention; chronic antithrombotic therapy using ASA + clopidogrel   Bilateral carotid artery stenosis    a.) s/p PTA 04/07/2022 --> 90% RICA --> 9x7x30 Exact stent; b.) doppler 03/14/20214: 1-39% RICA, 40-59% LICA   BPH with urinary obstruction    stable on flomax ([name]   Childhood asthma    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   a.) CT head and MRI brain 04/05/2022: chronic cortical/subcortical RIGHT MCA territory infarct within the RIGHT  parietal lobe and RIGHT temporoparietal junction   Coronary atherosclerosis of unspecified type of vessel, native or graft    a.) cCTA 05/22/2021: Ca score 1524 (84th percentile for age/sex match control) --> FFRct: dLAD = 0.77, dLCx = 0.67, RPLA = 0.77, dOM1 = 0.66; b.) LHC 05/23/2021: 20% dRCA, 50% OM1 - med mgmt   Diastolic dysfunction    a.) TTE 04/06/2022: EF 60-65%, mod LVH, mild MR, AoV sclerosis, G1DD   DOE (dyspnea on exertion)    Emphysema of lung (HCC)    Erectile dysfunction    a.) on PDE5i (sildenafil)   Esophageal stricture    Fall    Gastric AVM    Gastritis    GERD (gastroesophageal reflux disease)    Hiatal hernia    Hx of colonic polyps    Hyperlipidemia    Hypertension    Internal hemorrhoids without mention of complication    Ischemic leg    Long term current use of antithrombotics/antiplatelets    a.) on DAPT (ASA + clopidogrel)   Lumbago    Mobitz (type) I (Wenckebach's) atrioventricular block    Non-small cell lung cancer (HCC)    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   a.) PCR (+) 05/29/2018, 04/07/2022   Pneumonia    PSA elevation    now averaging 2's   PVD (peripheral vascular disease) with claudication (HCC)    Sigmoid diverticulosis    Tobacco abuse    Type 2 diabetes mellitus treated with insulin (HCC)     Past Surgical History:  Procedure Laterality Date   2D Echo  07/2001   BACK SURGERY     CAROTID PTA/STENT INTERVENTION Right 04/07/2022   Procedure: CAROTID PTA/STENT INTERVENTION;  Surgeon: Renford Dills, MD;  Location: [facility];  Service: Cardiovascular;  Laterality: Right;   COLONOSCOPY     ENDOBRONCHIAL ULTRASOUND Left 06/19/2022   Procedure: ENDOBRONCHIAL ULTRASOUND;  Surgeon: Salena Saner, MD;  Location: [facility];  Service: Pulmonary;  Laterality: Left;   ESOPHAGOGASTRODUODENOSCOPY  02/13/2007   gastritis and duodenitis without bleed   ESOPHAGOGASTRODUODENOSCOPY N/A 06/26/2014   Procedure:  ESOPHAGOGASTRODUODENOSCOPY (EGD);  Surgeon: Rachael Fee, MD;  Location: [facility];  Service: Endoscopy;  Laterality: N/A;   ESOPHAGOGASTRODUODENOSCOPY (EGD) WITH PROPOFOL N/A 04/08/2020   Procedure: ESOPHAGOGASTRODUODENOSCOPY (EGD) WITH PROPOFOL;  Surgeon: Meryl Dare, MD;  Location: [facility];  Service: Endoscopy;  Laterality: N/A;   FLEXIBLE BRONCHOSCOPY Left 06/19/2022   Procedure: FLEXIBLE BRONCHOSCOPY;  Surgeon: Salena Saner, MD;  Location: [facility];  Service: Pulmonary;  Laterality: Left;   HOT HEMOSTASIS N/A 04/08/2020   Procedure: HOT HEMOSTASIS (ARGON PLASMA COAGULATION/BICAP);  Surgeon: Meryl Dare, MD;  Location: [facility];  Service: Endoscopy;  Laterality: N/A;   IR IMAGING GUIDED PORT INSERTION  06/30/2022   KNEE ARTHROSCOPY Right 11/1998   LEFT HEART CATH AND CORONARY ANGIOGRAPHY N/A 05/23/2021   Procedure: LEFT HEART CATH AND CORONARY ANGIOGRAPHY;  Surgeon: Orbie Pyo, MD;  Location: [facility];  Service: Cardiovascular;  Laterality: N/A;   LOWER EXTREMITY ANGIOGRAPHY Right 12/27/2017   Procedure: LOWER EXTREMITY ANGIOGRAPHY;  Surgeon: Annice Needy, MD;  Location: [facility];  Service: Cardiovascular;  Laterality: Right;   LOWER EXTREMITY ANGIOGRAPHY Left 01/10/2018   Procedure: LOWER EXTREMITY ANGIOGRAPHY;  Surgeon: Annice Needy, MD;  Location: [facility];  Service: Cardiovascular;  Laterality: Left;   LOWER EXTREMITY ANGIOGRAPHY Left 05/29/2018   Procedure: LOWER EXTREMITY ANGIOGRAPHY;  Surgeon: Annice Needy, MD;  Location: [facility];  Service: Cardiovascular;  Laterality: Left;   LOWER EXTREMITY ANGIOGRAPHY Left 05/30/2018   Procedure: Lower Extremity Angiography;  Surgeon: Annice Needy, MD;  Location: [facility];  Service: Cardiovascular;  Laterality: Left;   LP  07/2001   Microwave thermotherapy prostate  08/28/2007   [place]   Persantine cardiolite  04/1999   EF 68%   POLYPECTOMY  12/1993    Stress myoview  08/2004   EF 67%   UPPER GASTROINTESTINAL ENDOSCOPY      Current Outpatient Medications  Medication Instructions   acetaminophen (TYLENOL) 1,000 mg, Oral, Every 6 hours, Rapid release   atorvastatin (LIPITOR) 20 MG tablet TAKE 1 TABLET BY MOUTH IN THE EVENING FOR CHOLESTEROL   Basaglar KwikPen 30 Units, Subcutaneous, BH-each morning   clopidogrel (PLAVIX) 75 mg, Oral, Daily   cyanocobalamin (VITAMIN B12) 1,000 mcg, Oral, Every morning   ferrous gluconate (FERGON) 324 MG tablet TAKE 1 TABLET BY MOUTH EVERY DAY WITH BREAKFAST   FIASP FLEXTOUCH 100 UNIT/ML FlexTouch Pen Inject 10 units before lunch meal each day.   FIBER PO 1 capsule, Oral, Every morning   finasteride (PROSCAR) 5 mg, Oral, Daily   Fluticasone-Umeclidin-Vilant (TRELEGY ELLIPTA) 100-62.5-25 MCG/ACT AEPB 1 puff, Inhalation, Daily   glipiZIDE (GLUCOTROL) 10 mg, Oral, Daily before breakfast   GLUCOSAMINE HCL PO 1 tablet, Oral, Daily   lisinopril (ZESTRIL) 20 mg, Oral, Daily, for blood pressure.   metFORMIN (GLUCOPHAGE) 500 MG tablet TAKE 2 TABLETS BY MOUTH TWICE A  DAY WITH MEAL FOR DIABETES   pantoprazole (PROTONIX) 40 mg, Oral, Daily, For heartburn   sildenafil (VIAGRA) 100 mg, Oral, Daily PRN   tamsulosin (FLOMAX) 0.4 mg, Oral, 2 times daily    Social History:  The patient  reports that he quit smoking about 17 months ago. His smoking use included cigarettes. He started smoking about 58 years ago. He has a 114.5 pack-year smoking history. He has quit using smokeless tobacco.  His smokeless tobacco use included chew. He reports current alcohol use. He reports that he does not use drugs.   Family History:  The patient's family history includes Alcohol abuse in his paternal uncle, paternal uncle, and paternal uncle; Diabetes in his father; Rheum arthritis in his mother.  ROS:  Please see the history of present illness. All other systems are reviewed and otherwise negative.   PHYSICAL EXAM:  VS:  BP (!) 164/73  (BP Location: Left Arm, Patient Position: Sitting, Cuff Size: Normal)   Pulse 79   Ht 5\' 7"  (1.702 m)   Wt 181 lb 2 oz (82.2 kg)   SpO2 97%   BMI 28.37 kg/m  BMI: Body mass index is 28.37 kg/m.  Orthostatic VS for the past 24 hrs (Last 3 readings):  BP- Lying Pulse- Lying BP- Sitting Pulse- Sitting BP- Standing at 0 minutes Pulse- Standing at 0 minutes BP- Standing at 3 minutes Pulse- Standing at 3 minutes  10/24/22 1019 146/73 78 131/66 80 111/65 90 122/68 86     GEN- The patient is well appearing, alert and oriented x 3 today.   Lungs- Clear to ausculation bilaterally, normal work of breathing.  Heart- Irregularly irregular rate and rhythm, no murmurs, rubs or gallops Extremities- No peripheral edema, warm, dry   EKG is ordered. Personal review of EKG from today shows:    EKG Interpretation Date/Time:  Tuesday October 24 2022 10:18:20 EDT Ventricular Rate:  79 PR Interval:    QRS Duration:  76 QT Interval:  370 QTC Calculation: 424 R Axis:   -42  Text Interpretation: Sinus rhythm with 2nd degree A-V block (Mobitz I) Left axis deviation Inferior infarct , age undetermined Confirmed by Sherie Don [phone]) on 10/24/2022 10:39:50 AM    Recent Labs: 04/08/2022: Magnesium 1.8 09/29/2022: ALT 10; BUN 19; Creatinine 1.00; Hemoglobin 10.7; Platelet Count 269; Potassium 4.1; Sodium 140; TSH 1.254  04/06/2022: Cholesterol 138; HDL 41; LDL Cholesterol 48; Total CHOL/HDL Ratio 3.4; Triglycerides 243; VLDL 49   CrCl cannot be calculated (Patient's most recent lab result is older than the maximum 21 days allowed.).   Wt Readings from Last 3 Encounters:  10/24/22 181 lb 2 oz (82.2 kg)  10/23/22 182 lb 9.6 oz (82.8 kg)  09/29/22 181 lb 6.4 oz (82.3 kg)     Additional studies reviewed include: Previous EP, cardiology notes.   TTE, 04/06/2022  1. Left ventricular ejection fraction, by estimation, is 60 to 65%. The left ventricle has normal function. The left ventricle has no regional  wall motion abnormalities. There is moderate left ventricular hypertrophy. Left ventricular diastolic parameters are consistent with Grade I diastolic dysfunction (impaired relaxation).   2. Right ventricular systolic function is normal. The right ventricular size is normal. There is normal pulmonary artery systolic pressure. The estimated right ventricular systolic pressure is 17.7 mmHg.   3. The mitral valve is normal in structure. Mild mitral valve regurgitation. No evidence of mitral stenosis.   4. The aortic valve is tricuspid. Aortic valve regurgitation is not visualized. Aortic  valve sclerosis is present, with no evidence of aortic valve stenosis.   5. The inferior vena cava is normal in size with greater than 50% respiratory variability, suggesting right atrial pressure of 3 mmHg.    ASSESSMENT AND PLAN:  #) Second deg HB, type 1 #) bradycardia No episodes of syncope Continue to monitor Avoid AV nodal blocking agents  #) HTN #) orthostatic hypotension Orthostatic in office today Recommend patient continue to slowly transition between positions Will continue lisinopril 20mg  daily at this time. If orthostatic symptoms worsen, low threshold to stop  #) LVH Discordant between most recent echo and EKG Dr. Odessa Fleming most recent OV note mentions obtaining PYP scan, but never ordered Will discuss with MD       Current medicines are reviewed at length with the patient today.   The patient does not have concerns regarding his medicines.  The following changes were made today:  none  Labs/ tests ordered today include:  Orders Placed This Encounter  Procedures   EKG 12-Lead     Disposition: Follow up with Dr. Graciela Husbands or EP APP in 6 months   Signed, Sherie Don, NP  10/24/22  10:40 AM  Electrophysiology [facility]

## 2022-10-23 NOTE — Progress Notes (Signed)
Wheeze  [] COPD   [] Asthma Neurologic:  [] Dizziness   [] Seizures   [] History of stroke   [] History of TIA  [] Aphasia   [] Vissual changes   [] Weakness or numbness in arm   [] Weakness or numbness in leg Musculoskeletal:   [] Joint swelling   [] Joint pain   [] Low back  pain Hematologic:  [] Easy bruising  [] Easy bleeding   [] Hypercoagulable state   [] Anemic Gastrointestinal:  [] Diarrhea   [] Vomiting  [] Gastroesophageal reflux/heartburn   [] Difficulty swallowing. Genitourinary:  [] Chronic kidney disease   [] Difficult urination  [] Frequent urination   [] Blood in urine Skin:  [] Rashes   [] Ulcers  Psychological:  [] History of anxiety   []  History of major depression.  Physical Examination  Vitals:   10/23/22 0950  BP: (!) 173/81  Pulse: 72  Resp: 18  Weight: 182 lb 9.6 oz (82.8 kg)  Height: 5\' 6"  (1.676 m)   Body mass index is 29.47 kg/m. Gen: WD/WN, NAD Head: [place]/AT, No temporalis wasting.  Ear/Nose/Throat: Hearing grossly intact, nares w/o erythema or drainage Eyes: PER, EOMI, sclera nonicteric.  Neck: Supple, no masses.  No bruit or JVD.  Pulmonary:  Good air movement, no audible wheezing, no use of accessory muscles.  Cardiac: RRR, normal S1, S2, no Murmurs. Vascular:  mild trophic changes, no open wounds Vessel Right Left  Radial Palpable Palpable  Carotid Palpable Palpable  PT Not Palpable Not Palpable  DP Not Palpable Not Palpable  Gastrointestinal: soft, non-distended. No guarding/no peritoneal signs.  Musculoskeletal: M/S 5/5 throughout.  No visible deformity.  Neurologic: CN 2-12 intact. Pain and light touch intact in extremities.  Symmetrical.  Speech is fluent. Motor exam as listed above. Psychiatric: Judgment intact, Mood & affect appropriate for pt's clinical situation. Dermatologic: No rashes or ulcers noted.  No changes consistent with cellulitis.   CBC Lab Results  Component Value Date   WBC 6.2 09/29/2022   HGB 10.7 (L) 09/29/2022   HCT 32.6 (L) 09/29/2022   MCV 95.0 09/29/2022   PLT 269 09/29/2022    BMET    Component Value Date/Time   NA 140 09/29/2022 0847   NA 141 07/10/2014 1025   K 4.1 09/29/2022 0847   CL 111 09/29/2022 0847   CO2 21 (L) 09/29/2022 0847   GLUCOSE 149 (H) 09/29/2022 0847   BUN 19  09/29/2022 0847   BUN 12 07/10/2014 1025   CREATININE 1.00 09/29/2022 0847   CALCIUM 8.4 (L) 09/29/2022 0847   GFRNONAA >60 09/29/2022 0847   GFRAA >60 05/31/2018 0529   CrCl cannot be calculated (Patient's most recent lab result is older than the maximum 21 days allowed.).  COAG Lab Results  Component Value Date   INR 1.0 04/06/2022   INR 1.1 04/05/2022   INR 1.1 05/29/2018    Radiology VAS [place] ABI WITH/WO TBI  Result Date: 10/23/2022  LOWER EXTREMITY DOPPLER STUDY Patient Name:  Antonio [name]  Date of Exam:   10/23/2022 Medical Rec #: [id number]      Accession #:    [id number] Date of Birth: 1945/05/12     Patient Gender: M Patient Age:   77 years Exam Location:  [facility] Procedure:      VAS [place] ABI WITH/WO TBI Referring Phys: Levora Dredge --------------------------------------------------------------------------------  Indications: Claudication, and peripheral artery disease. High Risk Factors: Hypertension.  Vascular Interventions: 05/29/2018 PTA of left PTA and popliteal A. PTA of left  See table(s) above for measurements and observations.  Electronically signed by Levora Dredge MD on 10/23/2022 at 12:27:42 PM.    Final    VAS US CAROTID  Result Date: 10/23/2022 Carotid Arterial Duplex Study Patient Name:  Antonio [name]  Date of Exam:   10/23/2022 Medical Rec #: [id number]      Accession #:    [id number] Date of Birth: Feb 12, 1945     Patient Gender: M Patient Age:   77 years Exam Location:  [facility] Procedure:      VAS US CAROTID Referring Phys: Levora Dredge --------------------------------------------------------------------------------  Indications:       Carotid artery disease. Risk Factors:      Hypertension, prior CVA. Other Factors:     04/07/2022: RT ICA Stent. Comparison Study:  04/20/2022 Performing Technologist: Debbe Bales RVS  Examination Guidelines: A complete evaluation includes B-mode imaging, spectral Doppler, color Doppler, and power Doppler as needed of all accessible portions of each vessel. Bilateral testing is considered an integral part of a complete examination. Limited examinations for reoccurring indications may be performed as noted.  Right Carotid Findings: +----------+--------+--------+--------+------------------+--------+           PSV cm/sEDV cm/sStenosisPlaque DescriptionComments +----------+--------+--------+--------+------------------+--------+ CCA Prox  41      6                                          +----------+--------+--------+--------+------------------+--------+ CCA Mid   40      7                                          +----------+--------+--------+--------+------------------+--------+ CCA Distal44      11                                         +----------+--------+--------+--------+------------------+--------+ ICA Prox  104     24                                Stent     +----------+--------+--------+--------+------------------+--------+ ICA Mid   95      11                                         +----------+--------+--------+--------+------------------+--------+ ICA Distal72      13                                         +----------+--------+--------+--------+------------------+--------+ ECA       160     18                                         +----------+--------+--------+--------+------------------+--------+ +----------+--------+-------+--------+-------------------+           PSV cm/sEDV cmsDescribeArm Pressure (mmHG) +----------+--------+-------+--------+-------------------+ [id number]      0                                  +----------+--------+-------+--------+-------------------+ +---------+--------+--+--------+--+  See table(s) above for measurements and observations.  Electronically signed by Levora Dredge MD on 10/23/2022 at 12:27:42 PM.    Final    VAS US CAROTID  Result Date: 10/23/2022 Carotid Arterial Duplex Study Patient Name:  Antonio [name]  Date of Exam:   10/23/2022 Medical Rec #: [id number]      Accession #:    [id number] Date of Birth: Feb 12, 1945     Patient Gender: M Patient Age:   77 years Exam Location:  [facility] Procedure:      VAS US CAROTID Referring Phys: Levora Dredge --------------------------------------------------------------------------------  Indications:       Carotid artery disease. Risk Factors:      Hypertension, prior CVA. Other Factors:     04/07/2022: RT ICA Stent. Comparison Study:  04/20/2022 Performing Technologist: Debbe Bales RVS  Examination Guidelines: A complete evaluation includes B-mode imaging, spectral Doppler, color Doppler, and power Doppler as needed of all accessible portions of each vessel. Bilateral testing is considered an integral part of a complete examination. Limited examinations for reoccurring indications may be performed as noted.  Right Carotid Findings: +----------+--------+--------+--------+------------------+--------+           PSV cm/sEDV cm/sStenosisPlaque DescriptionComments +----------+--------+--------+--------+------------------+--------+ CCA Prox  41      6                                          +----------+--------+--------+--------+------------------+--------+ CCA Mid   40      7                                          +----------+--------+--------+--------+------------------+--------+ CCA Distal44      11                                         +----------+--------+--------+--------+------------------+--------+ ICA Prox  104     24                                Stent     +----------+--------+--------+--------+------------------+--------+ ICA Mid   95      11                                         +----------+--------+--------+--------+------------------+--------+ ICA Distal72      13                                         +----------+--------+--------+--------+------------------+--------+ ECA       160     18                                         +----------+--------+--------+--------+------------------+--------+ +----------+--------+-------+--------+-------------------+           PSV cm/sEDV cmsDescribeArm Pressure (mmHG) +----------+--------+-------+--------+-------------------+ [id number]      0                                  +----------+--------+-------+--------+-------------------+ +---------+--------+--+--------+--+  MRN : [id number]  HASANI MENDELSON is a 77 y.o. (01-15-1946) male who presents with chief complaint of check circulation.  History of Present Illness:   The patient is seen for follow up evaluation of carotid stenosis status post right carotid stent on 04/07/2022.     Procedure:  Placement of a 9 mm x 7 mm x 30 mm exact stent with the use of the NAV-6 embolic protection device in the right internal carotid artery    There were no post operative problems or complications related to the surgery.  The patient denies neck or incisional pain.   The patient denies interval amaurosis fugax. There is no recent history of TIA symptoms or focal motor deficits. There is no prior documented CVA.   The patient denies headache.   The patient is taking enteric-coated aspirin 81 mg daily.   No recent shortening of the patient's walking distance or new symptoms consistent with claudication.  No history of rest pain symptoms. No new ulcers or wounds of the lower extremities have occurred.   There is no history of DVT, PE or superficial thrombophlebitis. No recent episodes of angina or shortness of breath documented.   Carotid duplex today's RICA 1-39% and LICA 1-39% successful right stent   ABI's Rt=0.86 and Lt=0.77 (previous ABI's Rt=0.74 and Lt=0.91)    Current Meds  Medication Sig   acetaminophen (TYLENOL) 500 MG tablet Take 1,000 mg by mouth every 6 (six) hours. Rapid release   atorvastatin (LIPITOR) 20 MG tablet TAKE 1 TABLET BY MOUTH IN THE EVENING FOR CHOLESTEROL (Patient taking differently: Take 20 mg by mouth in the morning.)   clopidogrel (PLAVIX) 75 MG tablet Take 1 tablet (75 mg total) by mouth daily.   ferrous gluconate (FERGON) 324 MG tablet TAKE 1 TABLET BY MOUTH EVERY DAY WITH BREAKFAST (Patient taking differently: Take 324 mg by mouth daily with breakfast.)   FIASP FLEXTOUCH 100 UNIT/ML FlexTouch Pen Inject 10  units before lunch meal each day.   FIBER PO Take 1 capsule by mouth in the morning.   finasteride (PROSCAR) 5 MG tablet Take 1 tablet (5 mg total) by mouth daily. (Patient taking differently: Take 5 mg by mouth every morning.)   Fluticasone-Umeclidin-Vilant (TRELEGY ELLIPTA) 100-62.5-25 MCG/ACT AEPB Inhale 1 puff into the lungs daily.   glipiZIDE (GLUCOTROL) 10 MG tablet Take 10 mg by mouth daily before breakfast.   GLUCOSAMINE HCL PO Take 1 tablet by mouth daily.   Insulin Glargine (BASAGLAR KWIKPEN) 100 UNIT/ML Inject 30 Units into the skin every morning.   lisinopril (ZESTRIL) 20 MG tablet Take 1 tablet (20 mg total) by mouth daily. for blood pressure.   metFORMIN (GLUCOPHAGE) 500 MG tablet TAKE 2 TABLETS BY MOUTH TWICE A DAY WITH MEAL FOR DIABETES (Patient taking differently: Take 1,000 mg by mouth 2 (two) times daily. TAKE 2 TABLETS BY MOUTH TWICE A DAY WITH MEAL FOR DIABETES)   pantoprazole (PROTONIX) 40 MG tablet TAKE 1 TABLET (40 MG TOTAL) BY MOUTH DAILY. FOR HEARTBURN   sildenafil (VIAGRA) 100 MG tablet Take 1 tablet (100 mg total) by mouth  See table(s) above for measurements and observations.  Electronically signed by Levora Dredge MD on 10/23/2022 at 12:27:42 PM.    Final    VAS US CAROTID  Result Date: 10/23/2022 Carotid Arterial Duplex Study Patient Name:  Antonio [name]  Date of Exam:   10/23/2022 Medical Rec #: [id number]      Accession #:    [id number] Date of Birth: Feb 12, 1945     Patient Gender: M Patient Age:   77 years Exam Location:  [facility] Procedure:      VAS US CAROTID Referring Phys: Levora Dredge --------------------------------------------------------------------------------  Indications:       Carotid artery disease. Risk Factors:      Hypertension, prior CVA. Other Factors:     04/07/2022: RT ICA Stent. Comparison Study:  04/20/2022 Performing Technologist: Debbe Bales RVS  Examination Guidelines: A complete evaluation includes B-mode imaging, spectral Doppler, color Doppler, and power Doppler as needed of all accessible portions of each vessel. Bilateral testing is considered an integral part of a complete examination. Limited examinations for reoccurring indications may be performed as noted.  Right Carotid Findings: +----------+--------+--------+--------+------------------+--------+           PSV cm/sEDV cm/sStenosisPlaque DescriptionComments +----------+--------+--------+--------+------------------+--------+ CCA Prox  41      6                                          +----------+--------+--------+--------+------------------+--------+ CCA Mid   40      7                                          +----------+--------+--------+--------+------------------+--------+ CCA Distal44      11                                         +----------+--------+--------+--------+------------------+--------+ ICA Prox  104     24                                Stent     +----------+--------+--------+--------+------------------+--------+ ICA Mid   95      11                                         +----------+--------+--------+--------+------------------+--------+ ICA Distal72      13                                         +----------+--------+--------+--------+------------------+--------+ ECA       160     18                                         +----------+--------+--------+--------+------------------+--------+ +----------+--------+-------+--------+-------------------+           PSV cm/sEDV cmsDescribeArm Pressure (mmHG) +----------+--------+-------+--------+-------------------+ [id number]      0                                  +----------+--------+-------+--------+-------------------+ +---------+--------+--+--------+--+  MRN : [id number]  HASANI MENDELSON is a 77 y.o. (01-15-1946) male who presents with chief complaint of check circulation.  History of Present Illness:   The patient is seen for follow up evaluation of carotid stenosis status post right carotid stent on 04/07/2022.     Procedure:  Placement of a 9 mm x 7 mm x 30 mm exact stent with the use of the NAV-6 embolic protection device in the right internal carotid artery    There were no post operative problems or complications related to the surgery.  The patient denies neck or incisional pain.   The patient denies interval amaurosis fugax. There is no recent history of TIA symptoms or focal motor deficits. There is no prior documented CVA.   The patient denies headache.   The patient is taking enteric-coated aspirin 81 mg daily.   No recent shortening of the patient's walking distance or new symptoms consistent with claudication.  No history of rest pain symptoms. No new ulcers or wounds of the lower extremities have occurred.   There is no history of DVT, PE or superficial thrombophlebitis. No recent episodes of angina or shortness of breath documented.   Carotid duplex today's RICA 1-39% and LICA 1-39% successful right stent   ABI's Rt=0.86 and Lt=0.77 (previous ABI's Rt=0.74 and Lt=0.91)    Current Meds  Medication Sig   acetaminophen (TYLENOL) 500 MG tablet Take 1,000 mg by mouth every 6 (six) hours. Rapid release   atorvastatin (LIPITOR) 20 MG tablet TAKE 1 TABLET BY MOUTH IN THE EVENING FOR CHOLESTEROL (Patient taking differently: Take 20 mg by mouth in the morning.)   clopidogrel (PLAVIX) 75 MG tablet Take 1 tablet (75 mg total) by mouth daily.   ferrous gluconate (FERGON) 324 MG tablet TAKE 1 TABLET BY MOUTH EVERY DAY WITH BREAKFAST (Patient taking differently: Take 324 mg by mouth daily with breakfast.)   FIASP FLEXTOUCH 100 UNIT/ML FlexTouch Pen Inject 10  units before lunch meal each day.   FIBER PO Take 1 capsule by mouth in the morning.   finasteride (PROSCAR) 5 MG tablet Take 1 tablet (5 mg total) by mouth daily. (Patient taking differently: Take 5 mg by mouth every morning.)   Fluticasone-Umeclidin-Vilant (TRELEGY ELLIPTA) 100-62.5-25 MCG/ACT AEPB Inhale 1 puff into the lungs daily.   glipiZIDE (GLUCOTROL) 10 MG tablet Take 10 mg by mouth daily before breakfast.   GLUCOSAMINE HCL PO Take 1 tablet by mouth daily.   Insulin Glargine (BASAGLAR KWIKPEN) 100 UNIT/ML Inject 30 Units into the skin every morning.   lisinopril (ZESTRIL) 20 MG tablet Take 1 tablet (20 mg total) by mouth daily. for blood pressure.   metFORMIN (GLUCOPHAGE) 500 MG tablet TAKE 2 TABLETS BY MOUTH TWICE A DAY WITH MEAL FOR DIABETES (Patient taking differently: Take 1,000 mg by mouth 2 (two) times daily. TAKE 2 TABLETS BY MOUTH TWICE A DAY WITH MEAL FOR DIABETES)   pantoprazole (PROTONIX) 40 MG tablet TAKE 1 TABLET (40 MG TOTAL) BY MOUTH DAILY. FOR HEARTBURN   sildenafil (VIAGRA) 100 MG tablet Take 1 tablet (100 mg total) by mouth  See table(s) above for measurements and observations.  Electronically signed by Levora Dredge MD on 10/23/2022 at 12:27:42 PM.    Final    VAS US CAROTID  Result Date: 10/23/2022 Carotid Arterial Duplex Study Patient Name:  Antonio [name]  Date of Exam:   10/23/2022 Medical Rec #: [id number]      Accession #:    [id number] Date of Birth: Feb 12, 1945     Patient Gender: M Patient Age:   77 years Exam Location:  [facility] Procedure:      VAS US CAROTID Referring Phys: Levora Dredge --------------------------------------------------------------------------------  Indications:       Carotid artery disease. Risk Factors:      Hypertension, prior CVA. Other Factors:     04/07/2022: RT ICA Stent. Comparison Study:  04/20/2022 Performing Technologist: Debbe Bales RVS  Examination Guidelines: A complete evaluation includes B-mode imaging, spectral Doppler, color Doppler, and power Doppler as needed of all accessible portions of each vessel. Bilateral testing is considered an integral part of a complete examination. Limited examinations for reoccurring indications may be performed as noted.  Right Carotid Findings: +----------+--------+--------+--------+------------------+--------+           PSV cm/sEDV cm/sStenosisPlaque DescriptionComments +----------+--------+--------+--------+------------------+--------+ CCA Prox  41      6                                          +----------+--------+--------+--------+------------------+--------+ CCA Mid   40      7                                          +----------+--------+--------+--------+------------------+--------+ CCA Distal44      11                                         +----------+--------+--------+--------+------------------+--------+ ICA Prox  104     24                                Stent     +----------+--------+--------+--------+------------------+--------+ ICA Mid   95      11                                         +----------+--------+--------+--------+------------------+--------+ ICA Distal72      13                                         +----------+--------+--------+--------+------------------+--------+ ECA       160     18                                         +----------+--------+--------+--------+------------------+--------+ +----------+--------+-------+--------+-------------------+           PSV cm/sEDV cmsDescribeArm Pressure (mmHG) +----------+--------+-------+--------+-------------------+ [id number]      0                                  +----------+--------+-------+--------+-------------------+ +---------+--------+--+--------+--+  Wheeze  [] COPD   [] Asthma Neurologic:  [] Dizziness   [] Seizures   [] History of stroke   [] History of TIA  [] Aphasia   [] Vissual changes   [] Weakness or numbness in arm   [] Weakness or numbness in leg Musculoskeletal:   [] Joint swelling   [] Joint pain   [] Low back  pain Hematologic:  [] Easy bruising  [] Easy bleeding   [] Hypercoagulable state   [] Anemic Gastrointestinal:  [] Diarrhea   [] Vomiting  [] Gastroesophageal reflux/heartburn   [] Difficulty swallowing. Genitourinary:  [] Chronic kidney disease   [] Difficult urination  [] Frequent urination   [] Blood in urine Skin:  [] Rashes   [] Ulcers  Psychological:  [] History of anxiety   []  History of major depression.  Physical Examination  Vitals:   10/23/22 0950  BP: (!) 173/81  Pulse: 72  Resp: 18  Weight: 182 lb 9.6 oz (82.8 kg)  Height: 5\' 6"  (1.676 m)   Body mass index is 29.47 kg/m. Gen: WD/WN, NAD Head: [place]/AT, No temporalis wasting.  Ear/Nose/Throat: Hearing grossly intact, nares w/o erythema or drainage Eyes: PER, EOMI, sclera nonicteric.  Neck: Supple, no masses.  No bruit or JVD.  Pulmonary:  Good air movement, no audible wheezing, no use of accessory muscles.  Cardiac: RRR, normal S1, S2, no Murmurs. Vascular:  mild trophic changes, no open wounds Vessel Right Left  Radial Palpable Palpable  Carotid Palpable Palpable  PT Not Palpable Not Palpable  DP Not Palpable Not Palpable  Gastrointestinal: soft, non-distended. No guarding/no peritoneal signs.  Musculoskeletal: M/S 5/5 throughout.  No visible deformity.  Neurologic: CN 2-12 intact. Pain and light touch intact in extremities.  Symmetrical.  Speech is fluent. Motor exam as listed above. Psychiatric: Judgment intact, Mood & affect appropriate for pt's clinical situation. Dermatologic: No rashes or ulcers noted.  No changes consistent with cellulitis.   CBC Lab Results  Component Value Date   WBC 6.2 09/29/2022   HGB 10.7 (L) 09/29/2022   HCT 32.6 (L) 09/29/2022   MCV 95.0 09/29/2022   PLT 269 09/29/2022    BMET    Component Value Date/Time   NA 140 09/29/2022 0847   NA 141 07/10/2014 1025   K 4.1 09/29/2022 0847   CL 111 09/29/2022 0847   CO2 21 (L) 09/29/2022 0847   GLUCOSE 149 (H) 09/29/2022 0847   BUN 19  09/29/2022 0847   BUN 12 07/10/2014 1025   CREATININE 1.00 09/29/2022 0847   CALCIUM 8.4 (L) 09/29/2022 0847   GFRNONAA >60 09/29/2022 0847   GFRAA >60 05/31/2018 0529   CrCl cannot be calculated (Patient's most recent lab result is older than the maximum 21 days allowed.).  COAG Lab Results  Component Value Date   INR 1.0 04/06/2022   INR 1.1 04/05/2022   INR 1.1 05/29/2018    Radiology VAS [place] ABI WITH/WO TBI  Result Date: 10/23/2022  LOWER EXTREMITY DOPPLER STUDY Patient Name:  Antonio [name]  Date of Exam:   10/23/2022 Medical Rec #: [id number]      Accession #:    [id number] Date of Birth: 1945/05/12     Patient Gender: M Patient Age:   77 years Exam Location:  [facility] Procedure:      VAS [place] ABI WITH/WO TBI Referring Phys: Levora Dredge --------------------------------------------------------------------------------  Indications: Claudication, and peripheral artery disease. High Risk Factors: Hypertension.  Vascular Interventions: 05/29/2018 PTA of left PTA and popliteal A. PTA of left

## 2022-10-24 ENCOUNTER — Ambulatory Visit: Payer: Medicare Other | Attending: Cardiology | Admitting: Cardiology

## 2022-10-24 ENCOUNTER — Encounter: Payer: Self-pay | Admitting: Cardiology

## 2022-10-24 ENCOUNTER — Other Ambulatory Visit: Payer: Self-pay

## 2022-10-24 VITALS — BP 164/73 | HR 79 | Ht 67.0 in | Wt 181.1 lb

## 2022-10-24 DIAGNOSIS — I517 Cardiomegaly: Secondary | ICD-10-CM | POA: Insufficient documentation

## 2022-10-24 DIAGNOSIS — I10 Essential (primary) hypertension: Secondary | ICD-10-CM | POA: Diagnosis not present

## 2022-10-24 DIAGNOSIS — I441 Atrioventricular block, second degree: Secondary | ICD-10-CM | POA: Insufficient documentation

## 2022-10-24 DIAGNOSIS — R001 Bradycardia, unspecified: Secondary | ICD-10-CM | POA: Insufficient documentation

## 2022-10-24 NOTE — Patient Instructions (Signed)
Medication Instructions:  The current medical regimen is effective;  continue present plan and medications.  *If you need a refill on your cardiac medications before your next appointment, please call your pharmacy*   Follow-Up: At [facility], you and your health needs are our priority.  As part of our continuing mission to provide you with exceptional heart care, we have created designated Provider Care Teams.  These Care Teams include your primary Cardiologist (physician) and Advanced Practice Providers (APPs -  Physician Assistants and Nurse Practitioners) who all work together to provide you with the care you need, when you need it.  We recommend signing up for the patient portal called "MyChart".  Sign up information is provided on this After Visit Summary.  MyChart is used to connect with patients for Virtual Visits (Telemedicine).  Patients are able to view lab/test results, encounter notes, upcoming appointments, etc.  Non-urgent messages can be sent to your provider as well.   To learn more about what you can do with MyChart, go to [email or web address].    Your next appointment:   6 month(s)  Provider:   Sherryl Manges, MD

## 2022-10-25 ENCOUNTER — Other Ambulatory Visit: Payer: Self-pay | Admitting: Primary Care

## 2022-10-25 DIAGNOSIS — E782 Mixed hyperlipidemia: Secondary | ICD-10-CM

## 2022-10-27 ENCOUNTER — Inpatient Hospital Stay (HOSPITAL_BASED_OUTPATIENT_CLINIC_OR_DEPARTMENT_OTHER): Payer: Medicare Other | Attending: Oncology | Admitting: Oncology

## 2022-10-27 ENCOUNTER — Inpatient Hospital Stay: Payer: Medicare Other

## 2022-10-27 ENCOUNTER — Inpatient Hospital Stay: Payer: Medicare Other | Attending: Oncology

## 2022-10-27 ENCOUNTER — Encounter: Payer: Self-pay | Admitting: Oncology

## 2022-10-27 VITALS — BP 160/82 | HR 55 | Temp 96.5°F | Resp 16 | Ht 67.0 in | Wt 183.6 lb

## 2022-10-27 DIAGNOSIS — Z87891 Personal history of nicotine dependence: Secondary | ICD-10-CM | POA: Diagnosis not present

## 2022-10-27 DIAGNOSIS — Z79899 Other long term (current) drug therapy: Secondary | ICD-10-CM | POA: Insufficient documentation

## 2022-10-27 DIAGNOSIS — Z5112 Encounter for antineoplastic immunotherapy: Secondary | ICD-10-CM | POA: Diagnosis not present

## 2022-10-27 DIAGNOSIS — C3412 Malignant neoplasm of upper lobe, left bronchus or lung: Secondary | ICD-10-CM | POA: Insufficient documentation

## 2022-10-27 DIAGNOSIS — C3490 Malignant neoplasm of unspecified part of unspecified bronchus or lung: Secondary | ICD-10-CM | POA: Diagnosis not present

## 2022-10-27 LAB — CMP (CANCER CENTER ONLY)
ALT: 10 U/L (ref 0–44)
AST: 14 U/L — ABNORMAL LOW (ref 15–41)
Albumin: 3.4 g/dL — ABNORMAL LOW (ref 3.5–5.0)
Alkaline Phosphatase: 49 U/L (ref 38–126)
Anion gap: 8 (ref 5–15)
BUN: 18 mg/dL (ref 8–23)
CO2: 23 mmol/L (ref 22–32)
Calcium: 8.5 mg/dL — ABNORMAL LOW (ref 8.9–10.3)
Chloride: 107 mmol/L (ref 98–111)
Creatinine: 0.92 mg/dL (ref 0.61–1.24)
GFR, Estimated: >60 mL/min (ref >60)
Glucose, Bld: 233 mg/dL — ABNORMAL HIGH (ref 70–99)
Potassium: 3.8 mmol/L (ref 3.5–5.1)
Sodium: 138 mmol/L (ref 135–145)
Total Bilirubin: 0.5 mg/dL (ref 0.3–1.2)
Total Protein: 6.3 g/dL — ABNORMAL LOW (ref 6.5–8.1)

## 2022-10-27 LAB — CBC WITH DIFFERENTIAL (CANCER CENTER ONLY)
Abs Immature Granulocytes: 0.05 10*3/uL (ref 0.00–0.07)
Basophils Absolute: 0.1 10*3/uL (ref 0.0–0.1)
Basophils Relative: 1 %
Eosinophils Absolute: 0.4 10*3/uL (ref 0.0–0.5)
Eosinophils Relative: 6 %
HCT: 31.8 % — ABNORMAL LOW (ref 39.0–52.0)
Hemoglobin: 10.4 g/dL — ABNORMAL LOW (ref 13.0–17.0)
Immature Granulocytes: 1 %
Lymphocytes Relative: 9 %
Lymphs Abs: 0.6 10*3/uL — ABNORMAL LOW (ref 0.7–4.0)
MCH: 31.2 pg (ref 26.0–34.0)
MCHC: 32.7 g/dL (ref 30.0–36.0)
MCV: 95.5 fL (ref 80.0–100.0)
Monocytes Absolute: 0.7 10*3/uL (ref 0.1–1.0)
Monocytes Relative: 10 %
Neutro Abs: 5.2 10*3/uL (ref 1.7–7.7)
Neutrophils Relative %: 73 %
Platelet Count: 262 10*3/uL (ref 150–400)
RBC: 3.33 MIL/uL — ABNORMAL LOW (ref 4.22–5.81)
RDW: 14.6 % (ref 11.5–15.5)
WBC Count: 7 10*3/uL (ref 4.0–10.5)
nRBC: 0 % (ref 0.0–0.2)

## 2022-10-27 MED ORDER — HEPARIN SOD (PORK) LOCK FLUSH 100 UNIT/ML IV SOLN
500.0000 [IU] | Freq: Once | INTRAVENOUS | Status: AC | PRN
  Administered 2022-10-27: 500 [IU]
  Filled 2022-10-27: qty 5

## 2022-10-27 MED ORDER — SODIUM CHLORIDE 0.9% FLUSH
10.0000 mL | INTRAVENOUS | Status: DC | PRN
  Administered 2022-10-27: 10 mL
  Filled 2022-10-27: qty 10

## 2022-10-27 MED ORDER — SODIUM CHLORIDE 0.9 % IV SOLN
Freq: Once | INTRAVENOUS | Status: AC
  Filled 2022-10-27: qty 250

## 2022-10-27 MED ORDER — SODIUM CHLORIDE 0.9 % IV SOLN
1500.0000 mg | Freq: Once | INTRAVENOUS | Status: AC
  Administered 2022-10-27: 1500 mg via INTRAVENOUS
  Filled 2022-10-27: qty 30

## 2022-10-27 NOTE — Patient Instructions (Signed)
[facility]  Discharge Instructions: Thank you for choosing [facility] to provide your oncology and hematology care.  If you have a lab appointment with the Cancer Center, please go directly to the Cancer Center and check in at the registration area.  Wear comfortable clothing and clothing appropriate for easy access to any Portacath or PICC line.   We strive to give you quality time with your provider. You may need to reschedule your appointment if you arrive late (15 or more minutes).  Arriving late affects you and other patients whose appointments are after yours.  Also, if you miss three or more appointments without notifying the office, you may be dismissed from the clinic at the provider's discretion.      For prescription refill requests, have your pharmacy contact our office and allow 72 hours for refills to be completed.    Today you received the following chemotherapy and/or immunotherapy agents- [id number]       To help prevent nausea and vomiting after your treatment, we encourage you to take your nausea medication as directed.  BELOW ARE SYMPTOMS THAT SHOULD BE REPORTED IMMEDIATELY: *FEVER GREATER THAN 100.4 F (38 C) OR HIGHER *CHILLS OR SWEATING *NAUSEA AND VOMITING THAT IS NOT CONTROLLED WITH YOUR NAUSEA MEDICATION *UNUSUAL SHORTNESS OF BREATH *UNUSUAL BRUISING OR BLEEDING *URINARY PROBLEMS (pain or burning when urinating, or frequent urination) *BOWEL PROBLEMS (unusual diarrhea, constipation, pain near the anus) TENDERNESS IN MOUTH AND THROAT WITH OR WITHOUT PRESENCE OF ULCERS (sore throat, sores in mouth, or a toothache) UNUSUAL RASH, SWELLING OR PAIN  UNUSUAL VAGINAL DISCHARGE OR ITCHING   Items with * indicate a potential emergency and should be followed up as soon as possible or go to the Emergency Department if any problems should occur.  Please show the CHEMOTHERAPY ALERT CARD or IMMUNOTHERAPY ALERT CARD at check-in to  the Emergency Department and triage nurse.  Should you have questions after your visit or need to cancel or reschedule your appointment, please contact [facility]  [phone] and follow the prompts.  Office hours are 8:00 a.m. to 4:30 p.m. Monday - Friday. Please note that voicemails left after 4:00 p.m. may not be returned until the following business day.  We are closed weekends and major holidays. You have access to a nurse at all times for urgent questions. Please call the main number to the clinic [phone] and follow the prompts.  For any non-urgent questions, you may also contact your provider using MyChart. We now offer e-Visits for anyone 28 and older to request care online for non-urgent symptoms. For details visit [email or web address].   Also download the MyChart app! Go to the app store, search "MyChart", open the app, select [place], and log in with your MyChart username and password.

## 2022-10-27 NOTE — Progress Notes (Signed)
Hematology/Oncology Consult note [facility]  [phone] [phone]  Patient Care Team: Doreene Nest, NP as PCP - General (Internal Medicine) Debbe Odea, MD as PCP - Cardiology (Cardiology) Duke Salvia, MD as PCP - Electrophysiology (Cardiology) Harle Battiest PA-C as Physician Assistant (Urology) Meryl Dare, MD as Consulting Physician (Gastroenterology) Kathyrn Sheriff, [facility] (Inactive) as Pharmacist (Pharmacist) Glory Buff, RN as Oncology Nurse Navigator Creig Hines, MD as Consulting Physician (Oncology)   Name of the patient: Antonio [name]  [id number]  05-16-1945   Date of visit: 10/27/22  Diagnosis-locally recurrent adenocarcinoma of the lung  Chief complaint/ Reason for visit-on treatment assessment prior to cycle 2 of maintenance durvalumab  Heme/Onc history: patient is a 77 year old male who was diagnosed with stage I non-small cell lung cancer involving the left upper lobe about 1 year ago and he received SBRT for the same. He has been undergoing surveillance CT scans since then. CT chest in April 2024 showed increasing areas of disease with left-sided mediastinal and hilar lymph nodes and soft tissue along the left main bronchus. 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 No residual focal hypermetabolism in the treated area of the left upper lobe. No evidence of distant metastatic disease. He underwent MRI brain without contrast in February 2024 which did not show any evidence of metastatic disease. Patient was seen by Dr. Jayme Cloud and underwent EBUS guided biopsies left paratracheal lymph node was biopsied and consistent with pulmonary adenocarcinoma.  Patient completed concurrent chemoradiation with weekly CarboTaxol chemotherapy in July 2024.  Not enough tissue available for NGS testing.  Circulogene  assay was sent by pulmonary and peripheral blood NGS testing did not show any evidence of EGFR or ALK mutation   Patient started on maintenance durvalumab in August 2024  Interval history-tolerating durvalumab well without any significant skin rash diarrhea or shortness of breath.  Denies any specific complaints at this time.  His afternoon blood sugars have been running high and medications are being adjusted by his primary care doctor.  ECOG PS- 1 Pain scale- 0   Review of systems- Review of Systems  Constitutional:  Negative for chills, fever, malaise/fatigue and weight loss.  HENT:  Negative for congestion, ear discharge and nosebleeds.   Eyes:  Negative for blurred vision.  Respiratory:  Negative for cough, hemoptysis, sputum production, shortness of breath and wheezing.   Cardiovascular:  Negative for chest pain, palpitations, orthopnea and claudication.  Gastrointestinal:  Negative for abdominal pain, blood in stool, constipation, diarrhea, heartburn, melena, nausea and vomiting.  Genitourinary:  Negative for dysuria, flank pain, frequency, hematuria and urgency.  Musculoskeletal:  Negative for back pain, joint pain and myalgias.  Skin:  Negative for rash.  Neurological:  Negative for dizziness, tingling, focal weakness, seizures, weakness and headaches.  Endo/Heme/Allergies:  Does not bruise/bleed easily.  Psychiatric/Behavioral:  Negative for depression and suicidal ideas. The patient does not have insomnia.       No Known Allergies   Past Medical History:  Diagnosis Date   Adenoma of left adrenal gland    Anemia    Aortic atherosclerosis (HCC)    Arthritis    Atrial fibrillation and flutter (HCC)    a.) CHA2DS2VASc = 7 (age x 2, HTN, CVA x 2, vascular disease history, T2DM);  b.) rate/rhythm maintained without pharmacological intervention; chronic antithrombotic therapy using ASA + clopidogrel  Hematology/Oncology Consult note [facility]  [phone] [phone]  Patient Care Team: Doreene Nest, NP as PCP - General (Internal Medicine) Debbe Odea, MD as PCP - Cardiology (Cardiology) Duke Salvia, MD as PCP - Electrophysiology (Cardiology) Harle Battiest PA-C as Physician Assistant (Urology) Meryl Dare, MD as Consulting Physician (Gastroenterology) Kathyrn Sheriff, [facility] (Inactive) as Pharmacist (Pharmacist) Glory Buff, RN as Oncology Nurse Navigator Creig Hines, MD as Consulting Physician (Oncology)   Name of the patient: Antonio [name]  [id number]  05-16-1945   Date of visit: 10/27/22  Diagnosis-locally recurrent adenocarcinoma of the lung  Chief complaint/ Reason for visit-on treatment assessment prior to cycle 2 of maintenance durvalumab  Heme/Onc history: patient is a 77 year old male who was diagnosed with stage I non-small cell lung cancer involving the left upper lobe about 1 year ago and he received SBRT for the same. He has been undergoing surveillance CT scans since then. CT chest in April 2024 showed increasing areas of disease with left-sided mediastinal and hilar lymph nodes and soft tissue along the left main bronchus. 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 No residual focal hypermetabolism in the treated area of the left upper lobe. No evidence of distant metastatic disease. He underwent MRI brain without contrast in February 2024 which did not show any evidence of metastatic disease. Patient was seen by Dr. Jayme Cloud and underwent EBUS guided biopsies left paratracheal lymph node was biopsied and consistent with pulmonary adenocarcinoma.  Patient completed concurrent chemoradiation with weekly CarboTaxol chemotherapy in July 2024.  Not enough tissue available for NGS testing.  Circulogene  assay was sent by pulmonary and peripheral blood NGS testing did not show any evidence of EGFR or ALK mutation   Patient started on maintenance durvalumab in August 2024  Interval history-tolerating durvalumab well without any significant skin rash diarrhea or shortness of breath.  Denies any specific complaints at this time.  His afternoon blood sugars have been running high and medications are being adjusted by his primary care doctor.  ECOG PS- 1 Pain scale- 0   Review of systems- Review of Systems  Constitutional:  Negative for chills, fever, malaise/fatigue and weight loss.  HENT:  Negative for congestion, ear discharge and nosebleeds.   Eyes:  Negative for blurred vision.  Respiratory:  Negative for cough, hemoptysis, sputum production, shortness of breath and wheezing.   Cardiovascular:  Negative for chest pain, palpitations, orthopnea and claudication.  Gastrointestinal:  Negative for abdominal pain, blood in stool, constipation, diarrhea, heartburn, melena, nausea and vomiting.  Genitourinary:  Negative for dysuria, flank pain, frequency, hematuria and urgency.  Musculoskeletal:  Negative for back pain, joint pain and myalgias.  Skin:  Negative for rash.  Neurological:  Negative for dizziness, tingling, focal weakness, seizures, weakness and headaches.  Endo/Heme/Allergies:  Does not bruise/bleed easily.  Psychiatric/Behavioral:  Negative for depression and suicidal ideas. The patient does not have insomnia.       No Known Allergies   Past Medical History:  Diagnosis Date   Adenoma of left adrenal gland    Anemia    Aortic atherosclerosis (HCC)    Arthritis    Atrial fibrillation and flutter (HCC)    a.) CHA2DS2VASc = 7 (age x 2, HTN, CVA x 2, vascular disease history, T2DM);  b.) rate/rhythm maintained without pharmacological intervention; chronic antithrombotic therapy using ASA + clopidogrel  ATA      120               0.72 biphasic         +---------+------------------+-----+--------+--------+ PTA      143               0.86 biphasic         +---------+------------------+-----+--------+--------+ Great Toe128               0.77                  +---------+------------------+-----+--------+--------+ +---------+------------------+-----+--------+-------+ Left     Lt Pressure (mmHg)IndexWaveformComment +---------+------------------+-----+--------+-------+ Brachial 167                                    +---------+------------------+-----+--------+-------+ ATA      118               0.71 biphasic        +---------+------------------+-----+--------+-------+ PTA      128               0.77 biphasic        +---------+------------------+-----+--------+-------+ Great Toe137               0.82                 +---------+------------------+-----+--------+-------+ +-------+-----------+-----------+------------+------------+ ABI/TBIToday's ABIToday's TBIPrevious ABIPrevious TBI +-------+-----------+-----------+------------+------------+ Right  .86         .77        .74         .34          +-------+-----------+-----------+------------+------------+ Left   .77        .82        .91         .56          +-------+-----------+-----------+------------+------------+ Bilateral TBIs appear increased compared to prior study on 04/20/2022. Right ABIs appear increased compared to prior study on 04/20/2022. Lt ABIs appear to be decreased compared to prior study on 04/20/2022.  Summary: Right: Resting right ankle-brachial index indicates mild right lower extremity arterial disease. The right toe-brachial index is normal. Left: Resting left ankle-brachial index indicates moderate left lower extremity arterial disease. The left toe-brachial index is normal. *See table(s) above for measurements and observations.  Electronically signed by Levora Dredge MD on 10/23/2022 at 12:27:42 PM.    Final    VAS US CAROTID  Result Date: 10/23/2022 Carotid Arterial Duplex Study Patient Name:  SOVEREIGN YAMANE  Date of Exam:   10/23/2022 Medical Rec #: [id number]      Accession #:    [id number] Date of Birth: 10-26-1945     Patient Gender: M Patient Age:   16 years Exam Location:  [facility] Procedure:      VAS US CAROTID Referring Phys: Levora Dredge --------------------------------------------------------------------------------  Indications:       Carotid artery disease. Risk Factors:      Hypertension, prior CVA. Other Factors:     04/07/2022: RT ICA Stent. Comparison Study:  04/20/2022 Performing Technologist: Debbe Bales RVS  Examination Guidelines: A complete evaluation includes B-mode imaging, spectral Doppler, color Doppler, and power Doppler as needed of all accessible portions of each vessel. Bilateral testing is considered an integral part of a complete examination. Limited examinations for reoccurring indications may be performed as noted.  Right Carotid Findings: +----------+--------+--------+--------+------------------+--------+  Hematology/Oncology Consult note [facility]  [phone] [phone]  Patient Care Team: Doreene Nest, NP as PCP - General (Internal Medicine) Debbe Odea, MD as PCP - Cardiology (Cardiology) Duke Salvia, MD as PCP - Electrophysiology (Cardiology) Harle Battiest PA-C as Physician Assistant (Urology) Meryl Dare, MD as Consulting Physician (Gastroenterology) Kathyrn Sheriff, [facility] (Inactive) as Pharmacist (Pharmacist) Glory Buff, RN as Oncology Nurse Navigator Creig Hines, MD as Consulting Physician (Oncology)   Name of the patient: Antonio [name]  [id number]  05-16-1945   Date of visit: 10/27/22  Diagnosis-locally recurrent adenocarcinoma of the lung  Chief complaint/ Reason for visit-on treatment assessment prior to cycle 2 of maintenance durvalumab  Heme/Onc history: patient is a 77 year old male who was diagnosed with stage I non-small cell lung cancer involving the left upper lobe about 1 year ago and he received SBRT for the same. He has been undergoing surveillance CT scans since then. CT chest in April 2024 showed increasing areas of disease with left-sided mediastinal and hilar lymph nodes and soft tissue along the left main bronchus. 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 No residual focal hypermetabolism in the treated area of the left upper lobe. No evidence of distant metastatic disease. He underwent MRI brain without contrast in February 2024 which did not show any evidence of metastatic disease. Patient was seen by Dr. Jayme Cloud and underwent EBUS guided biopsies left paratracheal lymph node was biopsied and consistent with pulmonary adenocarcinoma.  Patient completed concurrent chemoradiation with weekly CarboTaxol chemotherapy in July 2024.  Not enough tissue available for NGS testing.  Circulogene  assay was sent by pulmonary and peripheral blood NGS testing did not show any evidence of EGFR or ALK mutation   Patient started on maintenance durvalumab in August 2024  Interval history-tolerating durvalumab well without any significant skin rash diarrhea or shortness of breath.  Denies any specific complaints at this time.  His afternoon blood sugars have been running high and medications are being adjusted by his primary care doctor.  ECOG PS- 1 Pain scale- 0   Review of systems- Review of Systems  Constitutional:  Negative for chills, fever, malaise/fatigue and weight loss.  HENT:  Negative for congestion, ear discharge and nosebleeds.   Eyes:  Negative for blurred vision.  Respiratory:  Negative for cough, hemoptysis, sputum production, shortness of breath and wheezing.   Cardiovascular:  Negative for chest pain, palpitations, orthopnea and claudication.  Gastrointestinal:  Negative for abdominal pain, blood in stool, constipation, diarrhea, heartburn, melena, nausea and vomiting.  Genitourinary:  Negative for dysuria, flank pain, frequency, hematuria and urgency.  Musculoskeletal:  Negative for back pain, joint pain and myalgias.  Skin:  Negative for rash.  Neurological:  Negative for dizziness, tingling, focal weakness, seizures, weakness and headaches.  Endo/Heme/Allergies:  Does not bruise/bleed easily.  Psychiatric/Behavioral:  Negative for depression and suicidal ideas. The patient does not have insomnia.       No Known Allergies   Past Medical History:  Diagnosis Date   Adenoma of left adrenal gland    Anemia    Aortic atherosclerosis (HCC)    Arthritis    Atrial fibrillation and flutter (HCC)    a.) CHA2DS2VASc = 7 (age x 2, HTN, CVA x 2, vascular disease history, T2DM);  b.) rate/rhythm maintained without pharmacological intervention; chronic antithrombotic therapy using ASA + clopidogrel  ATA      120               0.72 biphasic         +---------+------------------+-----+--------+--------+ PTA      143               0.86 biphasic         +---------+------------------+-----+--------+--------+ Great Toe128               0.77                  +---------+------------------+-----+--------+--------+ +---------+------------------+-----+--------+-------+ Left     Lt Pressure (mmHg)IndexWaveformComment +---------+------------------+-----+--------+-------+ Brachial 167                                    +---------+------------------+-----+--------+-------+ ATA      118               0.71 biphasic        +---------+------------------+-----+--------+-------+ PTA      128               0.77 biphasic        +---------+------------------+-----+--------+-------+ Great Toe137               0.82                 +---------+------------------+-----+--------+-------+ +-------+-----------+-----------+------------+------------+ ABI/TBIToday's ABIToday's TBIPrevious ABIPrevious TBI +-------+-----------+-----------+------------+------------+ Right  .86         .77        .74         .34          +-------+-----------+-----------+------------+------------+ Left   .77        .82        .91         .56          +-------+-----------+-----------+------------+------------+ Bilateral TBIs appear increased compared to prior study on 04/20/2022. Right ABIs appear increased compared to prior study on 04/20/2022. Lt ABIs appear to be decreased compared to prior study on 04/20/2022.  Summary: Right: Resting right ankle-brachial index indicates mild right lower extremity arterial disease. The right toe-brachial index is normal. Left: Resting left ankle-brachial index indicates moderate left lower extremity arterial disease. The left toe-brachial index is normal. *See table(s) above for measurements and observations.  Electronically signed by Levora Dredge MD on 10/23/2022 at 12:27:42 PM.    Final    VAS US CAROTID  Result Date: 10/23/2022 Carotid Arterial Duplex Study Patient Name:  SOVEREIGN YAMANE  Date of Exam:   10/23/2022 Medical Rec #: [id number]      Accession #:    [id number] Date of Birth: 10-26-1945     Patient Gender: M Patient Age:   16 years Exam Location:  [facility] Procedure:      VAS US CAROTID Referring Phys: Levora Dredge --------------------------------------------------------------------------------  Indications:       Carotid artery disease. Risk Factors:      Hypertension, prior CVA. Other Factors:     04/07/2022: RT ICA Stent. Comparison Study:  04/20/2022 Performing Technologist: Debbe Bales RVS  Examination Guidelines: A complete evaluation includes B-mode imaging, spectral Doppler, color Doppler, and power Doppler as needed of all accessible portions of each vessel. Bilateral testing is considered an integral part of a complete examination. Limited examinations for reoccurring indications may be performed as noted.  Right Carotid Findings: +----------+--------+--------+--------+------------------+--------+  ATA      120               0.72 biphasic         +---------+------------------+-----+--------+--------+ PTA      143               0.86 biphasic         +---------+------------------+-----+--------+--------+ Great Toe128               0.77                  +---------+------------------+-----+--------+--------+ +---------+------------------+-----+--------+-------+ Left     Lt Pressure (mmHg)IndexWaveformComment +---------+------------------+-----+--------+-------+ Brachial 167                                    +---------+------------------+-----+--------+-------+ ATA      118               0.71 biphasic        +---------+------------------+-----+--------+-------+ PTA      128               0.77 biphasic        +---------+------------------+-----+--------+-------+ Great Toe137               0.82                 +---------+------------------+-----+--------+-------+ +-------+-----------+-----------+------------+------------+ ABI/TBIToday's ABIToday's TBIPrevious ABIPrevious TBI +-------+-----------+-----------+------------+------------+ Right  .86         .77        .74         .34          +-------+-----------+-----------+------------+------------+ Left   .77        .82        .91         .56          +-------+-----------+-----------+------------+------------+ Bilateral TBIs appear increased compared to prior study on 04/20/2022. Right ABIs appear increased compared to prior study on 04/20/2022. Lt ABIs appear to be decreased compared to prior study on 04/20/2022.  Summary: Right: Resting right ankle-brachial index indicates mild right lower extremity arterial disease. The right toe-brachial index is normal. Left: Resting left ankle-brachial index indicates moderate left lower extremity arterial disease. The left toe-brachial index is normal. *See table(s) above for measurements and observations.  Electronically signed by Levora Dredge MD on 10/23/2022 at 12:27:42 PM.    Final    VAS US CAROTID  Result Date: 10/23/2022 Carotid Arterial Duplex Study Patient Name:  SOVEREIGN YAMANE  Date of Exam:   10/23/2022 Medical Rec #: [id number]      Accession #:    [id number] Date of Birth: 10-26-1945     Patient Gender: M Patient Age:   16 years Exam Location:  [facility] Procedure:      VAS US CAROTID Referring Phys: Levora Dredge --------------------------------------------------------------------------------  Indications:       Carotid artery disease. Risk Factors:      Hypertension, prior CVA. Other Factors:     04/07/2022: RT ICA Stent. Comparison Study:  04/20/2022 Performing Technologist: Debbe Bales RVS  Examination Guidelines: A complete evaluation includes B-mode imaging, spectral Doppler, color Doppler, and power Doppler as needed of all accessible portions of each vessel. Bilateral testing is considered an integral part of a complete examination. Limited examinations for reoccurring indications may be performed as noted.  Right Carotid Findings: +----------+--------+--------+--------+------------------+--------+  ATA      120               0.72 biphasic         +---------+------------------+-----+--------+--------+ PTA      143               0.86 biphasic         +---------+------------------+-----+--------+--------+ Great Toe128               0.77                  +---------+------------------+-----+--------+--------+ +---------+------------------+-----+--------+-------+ Left     Lt Pressure (mmHg)IndexWaveformComment +---------+------------------+-----+--------+-------+ Brachial 167                                    +---------+------------------+-----+--------+-------+ ATA      118               0.71 biphasic        +---------+------------------+-----+--------+-------+ PTA      128               0.77 biphasic        +---------+------------------+-----+--------+-------+ Great Toe137               0.82                 +---------+------------------+-----+--------+-------+ +-------+-----------+-----------+------------+------------+ ABI/TBIToday's ABIToday's TBIPrevious ABIPrevious TBI +-------+-----------+-----------+------------+------------+ Right  .86         .77        .74         .34          +-------+-----------+-----------+------------+------------+ Left   .77        .82        .91         .56          +-------+-----------+-----------+------------+------------+ Bilateral TBIs appear increased compared to prior study on 04/20/2022. Right ABIs appear increased compared to prior study on 04/20/2022. Lt ABIs appear to be decreased compared to prior study on 04/20/2022.  Summary: Right: Resting right ankle-brachial index indicates mild right lower extremity arterial disease. The right toe-brachial index is normal. Left: Resting left ankle-brachial index indicates moderate left lower extremity arterial disease. The left toe-brachial index is normal. *See table(s) above for measurements and observations.  Electronically signed by Levora Dredge MD on 10/23/2022 at 12:27:42 PM.    Final    VAS US CAROTID  Result Date: 10/23/2022 Carotid Arterial Duplex Study Patient Name:  SOVEREIGN YAMANE  Date of Exam:   10/23/2022 Medical Rec #: [id number]      Accession #:    [id number] Date of Birth: 10-26-1945     Patient Gender: M Patient Age:   16 years Exam Location:  [facility] Procedure:      VAS US CAROTID Referring Phys: Levora Dredge --------------------------------------------------------------------------------  Indications:       Carotid artery disease. Risk Factors:      Hypertension, prior CVA. Other Factors:     04/07/2022: RT ICA Stent. Comparison Study:  04/20/2022 Performing Technologist: Debbe Bales RVS  Examination Guidelines: A complete evaluation includes B-mode imaging, spectral Doppler, color Doppler, and power Doppler as needed of all accessible portions of each vessel. Bilateral testing is considered an integral part of a complete examination. Limited examinations for reoccurring indications may be performed as noted.  Right Carotid Findings: +----------+--------+--------+--------+------------------+--------+  Hematology/Oncology Consult note [facility]  [phone] [phone]  Patient Care Team: Doreene Nest, NP as PCP - General (Internal Medicine) Debbe Odea, MD as PCP - Cardiology (Cardiology) Duke Salvia, MD as PCP - Electrophysiology (Cardiology) Harle Battiest PA-C as Physician Assistant (Urology) Meryl Dare, MD as Consulting Physician (Gastroenterology) Kathyrn Sheriff, [facility] (Inactive) as Pharmacist (Pharmacist) Glory Buff, RN as Oncology Nurse Navigator Creig Hines, MD as Consulting Physician (Oncology)   Name of the patient: Antonio [name]  [id number]  05-16-1945   Date of visit: 10/27/22  Diagnosis-locally recurrent adenocarcinoma of the lung  Chief complaint/ Reason for visit-on treatment assessment prior to cycle 2 of maintenance durvalumab  Heme/Onc history: patient is a 77 year old male who was diagnosed with stage I non-small cell lung cancer involving the left upper lobe about 1 year ago and he received SBRT for the same. He has been undergoing surveillance CT scans since then. CT chest in April 2024 showed increasing areas of disease with left-sided mediastinal and hilar lymph nodes and soft tissue along the left main bronchus. 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 No residual focal hypermetabolism in the treated area of the left upper lobe. No evidence of distant metastatic disease. He underwent MRI brain without contrast in February 2024 which did not show any evidence of metastatic disease. Patient was seen by Dr. Jayme Cloud and underwent EBUS guided biopsies left paratracheal lymph node was biopsied and consistent with pulmonary adenocarcinoma.  Patient completed concurrent chemoradiation with weekly CarboTaxol chemotherapy in July 2024.  Not enough tissue available for NGS testing.  Circulogene  assay was sent by pulmonary and peripheral blood NGS testing did not show any evidence of EGFR or ALK mutation   Patient started on maintenance durvalumab in August 2024  Interval history-tolerating durvalumab well without any significant skin rash diarrhea or shortness of breath.  Denies any specific complaints at this time.  His afternoon blood sugars have been running high and medications are being adjusted by his primary care doctor.  ECOG PS- 1 Pain scale- 0   Review of systems- Review of Systems  Constitutional:  Negative for chills, fever, malaise/fatigue and weight loss.  HENT:  Negative for congestion, ear discharge and nosebleeds.   Eyes:  Negative for blurred vision.  Respiratory:  Negative for cough, hemoptysis, sputum production, shortness of breath and wheezing.   Cardiovascular:  Negative for chest pain, palpitations, orthopnea and claudication.  Gastrointestinal:  Negative for abdominal pain, blood in stool, constipation, diarrhea, heartburn, melena, nausea and vomiting.  Genitourinary:  Negative for dysuria, flank pain, frequency, hematuria and urgency.  Musculoskeletal:  Negative for back pain, joint pain and myalgias.  Skin:  Negative for rash.  Neurological:  Negative for dizziness, tingling, focal weakness, seizures, weakness and headaches.  Endo/Heme/Allergies:  Does not bruise/bleed easily.  Psychiatric/Behavioral:  Negative for depression and suicidal ideas. The patient does not have insomnia.       No Known Allergies   Past Medical History:  Diagnosis Date   Adenoma of left adrenal gland    Anemia    Aortic atherosclerosis (HCC)    Arthritis    Atrial fibrillation and flutter (HCC)    a.) CHA2DS2VASc = 7 (age x 2, HTN, CVA x 2, vascular disease history, T2DM);  b.) rate/rhythm maintained without pharmacological intervention; chronic antithrombotic therapy using ASA + clopidogrel

## 2022-10-27 NOTE — Addendum Note (Signed)
Addended by: Corene Cornea on: 10/27/2022 10:24 AM   Modules accepted: Orders

## 2022-11-03 ENCOUNTER — Other Ambulatory Visit: Payer: Self-pay | Admitting: Primary Care

## 2022-11-03 DIAGNOSIS — I441 Atrioventricular block, second degree: Secondary | ICD-10-CM

## 2022-11-03 DIAGNOSIS — I517 Cardiomegaly: Secondary | ICD-10-CM

## 2022-11-03 DIAGNOSIS — K2100 Gastro-esophageal reflux disease with esophagitis, without bleeding: Secondary | ICD-10-CM

## 2022-11-03 DIAGNOSIS — E782 Mixed hyperlipidemia: Secondary | ICD-10-CM

## 2022-11-09 ENCOUNTER — Telehealth: Payer: Self-pay | Admitting: Cardiology

## 2022-11-09 ENCOUNTER — Ambulatory Visit: Payer: Medicare Other | Admitting: Pulmonary Disease

## 2022-11-09 ENCOUNTER — Encounter: Payer: Self-pay | Admitting: Pulmonary Disease

## 2022-11-09 ENCOUNTER — Other Ambulatory Visit: Payer: Self-pay

## 2022-11-09 VITALS — BP 138/80 | HR 66 | Temp 97.5°F | Ht 67.0 in | Wt 183.0 lb

## 2022-11-09 DIAGNOSIS — C3492 Malignant neoplasm of unspecified part of left bronchus or lung: Secondary | ICD-10-CM

## 2022-11-09 DIAGNOSIS — I517 Cardiomegaly: Secondary | ICD-10-CM

## 2022-11-09 DIAGNOSIS — J449 Chronic obstructive pulmonary disease, unspecified: Secondary | ICD-10-CM

## 2022-11-09 DIAGNOSIS — Z87891 Personal history of nicotine dependence: Secondary | ICD-10-CM

## 2022-11-09 MED ORDER — ALBUTEROL SULFATE HFA 108 (90 BASE) MCG/ACT IN AERS: 2.0000 | INHALATION_SPRAY | Freq: Four times a day (QID) | RESPIRATORY_TRACT | 2 refills | Status: AC | PRN

## 2022-11-09 NOTE — Progress Notes (Signed)
Subjective:    Patient ID: Antonio [name], male    DOB: [birth year], 77 y.o.   MRN: [id number]  Patient Care Team: Doreene Nest, NP as PCP - General (Internal Medicine) Debbe Odea, MD as PCP - Cardiology (Cardiology) Duke Salvia, MD as PCP - Electrophysiology (Cardiology) Hulan Fray as Physician Assistant (Urology) Meryl Dare, MD as Consulting Physician (Gastroenterology) Kathyrn Sheriff, [facility] (Inactive) as Pharmacist (Pharmacist) Glory Buff, RN as Oncology Nurse Navigator Creig Hines, MD as Consulting Physician (Oncology)  Chief Complaint  Patient presents with   Follow-up    No SOB. Occasional wheezing and cough.    HPI This is a 77 year old former smoker (half PPD, 114 PY) who presents for follow-up of COPD and adenocarcinoma of the lung.  We last saw the patient on 01 August 2022, this is a scheduled visit.  Recall he had bronchoscopy with EBUS/TBNA performed on 19 Jun 2022.  Previously he had had a left perihilar lung nodule found during routine lung cancer screening.  He underwent robotic assisted bronchoscopy on 18 May 2021.  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  He had surveillance CT on 11 May 2022 following his SBRT treatment.  There were areas of thickening of tissue along the left mainstem bronchus and some narrowing of the lingula bronchus.  A PET/CT was performed on 23 April that showed a high left paratracheal node with increased SUV as well as a low left paratracheal node with a max SUV of 7.5.  He underwent bronchoscopy with EBUS/TBNA where his station 4L, left paratracheal, was sampled and findings were consistent with adenocarcinoma.  Concurrent chemoradiation with CarboTaxol chemotherapy and XRT.  He is currently on Durvalumab.  He follows with Dr. Smith Robert and Dr. Rushie Chestnut.  He is on Trelegy Ellipta for COPD.  He has not had PFTs diffusely ordered.  These will be reordered now  since he has completed chemo/XRT. He has not had any orthopnea or paroxysmal nocturnal dyspnea.  No chest pain, no lower extremity edema nor calf tenderness.  He does not endorse any dyspnea even on exertion since he has been on Trelegy.  He does not note wheezing since he has been on Trelegy.  Rare nonproductive cough.  This.   He has had 2 infusions of Durvalumab and has had no untoward effects.   Circulogene assay did not show any actionable variants by NGS.  No PD-L1 expression and a TP 53 variant detected.   Review of Systems A 10 point review of systems was performed and it is as noted above otherwise negative.   Patient Active Problem List   Diagnosis Date Noted   Recurrent non-small cell lung cancer (HCC) 06/30/2022   Mediastinal adenopathy 06/19/2022   Hematoma 05/22/2022   Right leg pain 05/22/2022   Bilateral carotid artery stenosis 04/06/2022   Stroke-like symptoms 04/06/2022   Encephalopathy 04/05/2022   UTI (urinary tract infection) 04/05/2022   History of CVA (cerebrovascular accident) 04/05/2022   Fatigue 12/21/2021   Hearing loss of right ear due to cerumen impaction 10/05/2021   Non-small cell lung cancer (HCC) 06/19/2021   Aortic atherosclerosis (HCC) 06/19/2021   Atrioventricular block, Mobitz type 1, Wenckebach 05/25/2021   Complete heart block (HCC) 05/20/2021   Gastric AVM    Iron deficiency anemia    Right buttock pain 01/12/2020   Fall 01/12/2020   Rash and nonspecific skin eruption 12/19/2019   Chronic pain of right hip 10/17/2019  Ischemic leg 05/29/2018   B12 deficiency 04/03/2018   PAD (peripheral artery disease) (HCC) 12/12/2017   Pain in limb 12/11/2017   GERD (gastroesophageal reflux disease) 04/30/2017   BPH with obstruction/lower urinary tract symptoms 10/14/2014   Essential hypertension 06/26/2014   Type 2 diabetes mellitus with hyperglycemia (HCC) 06/26/2014   HLD (hyperlipidemia) 06/27/2006   Coronary atherosclerosis 06/27/2006   Tobacco  abuse 06/27/2006    Social History   Tobacco Use   Smoking status: Former    Current packs/day: 0.00    Average packs/day: 2.0 packs/day for 57.3 years (114.5 ttl pk-yrs)    Types: Cigarettes    Start date: 02/07/1964    Quit date: 05/11/2021    Years since quitting: 1.4   Smokeless tobacco: Former    Types: Chew   Tobacco comments:    Quit in 2023- khj  Substance Use Topics   Alcohol use: Yes    Comment: rare    No Known Allergies  Current Meds  Medication Sig   acetaminophen (TYLENOL) 500 MG tablet Take 1,000 mg by mouth every 6 (six) hours. Rapid release   atorvastatin (LIPITOR) 20 MG tablet TAKE 1 TABLET BY MOUTH IN THE EVENING FOR CHOLESTEROL   clopidogrel (PLAVIX) 75 MG tablet Take 1 tablet (75 mg total) by mouth daily.   FIASP FLEXTOUCH 100 UNIT/ML FlexTouch Pen Inject 10 units before lunch meal each day.   FIBER PO Take 1 capsule by mouth in the morning.   finasteride (PROSCAR) 5 MG tablet Take 1 tablet (5 mg total) by mouth daily. (Patient taking differently: Take 5 mg by mouth every morning.)   Fluticasone-Umeclidin-Vilant (TRELEGY ELLIPTA) 100-62.5-25 MCG/ACT AEPB Inhale 1 puff into the lungs daily.   glipiZIDE (GLUCOTROL) 10 MG tablet Take 10 mg by mouth daily before breakfast.   GLUCOSAMINE HCL PO Take 1 tablet by mouth daily.   insulin aspart (NOVOLOG) 100 UNIT/ML injection Inject 10 Units into the skin once.   Insulin Glargine (BASAGLAR KWIKPEN) 100 UNIT/ML Inject 30 Units into the skin every morning.   lisinopril (ZESTRIL) 20 MG tablet Take 1 tablet (20 mg total) by mouth daily. for blood pressure.   metFORMIN (GLUCOPHAGE) 500 MG tablet TAKE 2 TABLETS BY MOUTH TWICE A DAY WITH MEAL FOR DIABETES (Patient taking differently: Take 1,000 mg by mouth 2 (two) times daily. TAKE 2 TABLETS BY MOUTH TWICE A DAY WITH MEAL FOR DIABETES)   pantoprazole (PROTONIX) 40 MG tablet TAKE 1 TABLET (40 MG TOTAL) BY MOUTH DAILY. FOR HEARTBURN   sildenafil (VIAGRA) 100 MG tablet Take 1  tablet (100 mg total) by mouth daily as needed for erectile dysfunction.   tamsulosin (FLOMAX) 0.4 MG CAPS capsule Take 1 capsule (0.4 mg total) by mouth 2 (two) times daily.   vitamin B-12 (CYANOCOBALAMIN) 1000 MCG tablet Take 1,000 mcg by mouth in the morning.    Immunization History  Administered Date(s) Administered   [employer] Vaccination 07/09/2019, 08/06/2019   Pneumococcal Conjugate-13 09/30/2018   Td 10/08/1994, 11/19/2006        Objective:     BP 138/80 (BP Location: Right Arm, Cuff Size: Normal)   Pulse 66   Temp (!) 97.5 F (36.4 C)   Ht 5\' 7"  (1.702 m)   Wt 183 lb (83 kg)   SpO2 95%   BMI 28.66 kg/m   SpO2: 95 % O2 Device: None (Room air)  GENERAL: Well-developed, well-nourished gentleman, no acute distress, dilatory with assistance of a cane. HEAD: Normocephalic, atraumatic.  EYES: Pupils  equal, round, reactive to light.  No scleral icterus.  MOUTH: Poor dentition, no thrush. NECK: Supple. No thyromegaly. Trachea midline. No JVD.  No adenopathy. PULMONARY: Good air entry bilaterally.  Coarse, otherwise, no adventitious sounds. CARDIOVASCULAR: S1 and S2. Regular rate and rhythm.  No rubs, murmurs or gallops heard. ABDOMEN: Benign. MUSCULOSKELETAL: No joint deformity, no clubbing, no edema.  NEUROLOGIC: No overt focal deficit, gait assisted by cane, speech is fluent. SKIN: Intact,warm,dry.  On limited exam no rashes PSYCH: Mood and behavior normal.   Assessment & Plan:     ICD-10-CM   1. Chronic obstructive pulmonary disease, unspecified COPD type (HCC)  J44.9 Pulmonary Function Test [facility] Only   Reorder PFTs to establish baseline Continue Trelegy 100, 1 puff daily Ordered as needed albuterol    2. Adenocarcinoma of left lung [facility])  C34.92    This issue adds complexity to his management Follows with medical/radiation oncology Currently on Durvalumab therapy    3. Former smoker  Z87.891    Has not relapsed     Orders Placed This  Encounter  Procedures   Pulmonary Function Test [facility] Only    Standing Status:   Future    Standing Expiration Date:   11/09/2023    Order Specific Question:   Full PFT: includes the following: basic spirometry, spirometry pre & post bronchodilator, diffusion capacity (DLCO), lung volumes    Answer:   Full PFT    Order Specific Question:   This test can only be performed at    Answer:   [facility]   Meds ordered this encounter  Medications   albuterol (VENTOLIN HFA) 108 (90 Base) MCG/ACT inhaler    Sig: Inhale 2 puffs into the lungs every 6 (six) hours as needed for wheezing or shortness of breath.    Dispense:  8 g    Refill:  2   We will proceed with reordering PFTs.  Will call patient with the results.  Otherwise patient to follow-up in 6 months time he is to contact us prior to that time should any new difficulties arise.   Gailen Shelter, MD Advanced Bronchoscopy P[facility]    *This note was dictated using voice recognition software/Dragon.  Despite best efforts to proofread, errors can occur which can change the meaning. Any transcriptional errors that result from this process are unintentional and may not be fully corrected at the time of dictation.

## 2022-11-09 NOTE — Telephone Encounter (Signed)
Dawn with Nuclear Medicine would like to inform Sherie Don, NP, that they do not perform the Amyloid Tumor tests at [facility] and their nurse from [facility] states they don't do them there either.

## 2022-11-09 NOTE — Patient Instructions (Addendum)
Your lungs sounded very clear today.  Continue taking your Trelegy 1 puff daily.  Added a prescription for an emergency inhaler that has been sent to your pharmacy.  This is just in case you develop shortness of breath or wheezing despite your Trelegy.  We are getting breathing test scheduled to get a baseline on your lung function.  Will call you with the results.  We will see you in follow-up in 6 months time call sooner should any new problems arise.

## 2022-11-09 NOTE — Telephone Encounter (Signed)
I spoke with Dr. Graciela Husbands regarding patient's concerning echo for LVH. Recommended PYP scan to further evaluation.  I called patient to update, who is agreeable to obtaining scan.

## 2022-11-09 NOTE — Telephone Encounter (Signed)
PYP scan ordered- will monitor for appointment.

## 2022-11-10 NOTE — Telephone Encounter (Signed)
I reordered the PYP scan. Sent to be scheduled, awaiting scheduling.   Thank you!

## 2022-11-13 NOTE — Telephone Encounter (Signed)
PYP scan scheduled for 10/14. Thanks!

## 2022-11-15 ENCOUNTER — Encounter (HOSPITAL_COMMUNITY)
Admission: RE | Admit: 2022-11-15 | Discharge: 2022-11-15 | Disposition: A | Discharge status: Home / Self Care | Payer: Medicare Other | Source: Ambulatory Visit | Attending: Cardiology | Admitting: Cardiology

## 2022-11-15 ENCOUNTER — Ambulatory Visit (HOSPITAL_COMMUNITY)
Admission: RE | Admit: 2022-11-15 | Discharge: 2022-11-15 | Disposition: A | Discharge status: Home / Self Care | Payer: Medicare Other | Source: Ambulatory Visit | Attending: Cardiology | Admitting: Cardiology

## 2022-11-15 DIAGNOSIS — I441 Atrioventricular block, second degree: Secondary | ICD-10-CM | POA: Insufficient documentation

## 2022-11-15 DIAGNOSIS — I517 Cardiomegaly: Secondary | ICD-10-CM | POA: Diagnosis not present

## 2022-11-15 DIAGNOSIS — I509 Heart failure, unspecified: Secondary | ICD-10-CM | POA: Diagnosis not present

## 2022-11-15 MED ORDER — TECHNETIUM TC 99M PYROPHOSPHATE
21.5000 | Freq: Once | INTRAVENOUS | Status: AC | PRN
  Administered 2022-11-15: 21.5 via INTRAVENOUS
  Filled 2022-11-15: qty 22

## 2022-11-16 ENCOUNTER — Telehealth (HOSPITAL_COMMUNITY): Payer: Self-pay | Admitting: *Deleted

## 2022-11-17 ENCOUNTER — Ambulatory Visit: Payer: Medicare Other | Admitting: Podiatry

## 2022-11-17 ENCOUNTER — Encounter: Payer: Self-pay | Admitting: Podiatry

## 2022-11-17 VITALS — BP 179/85

## 2022-11-17 DIAGNOSIS — B351 Tinea unguium: Secondary | ICD-10-CM

## 2022-11-17 DIAGNOSIS — M79674 Pain in right toe(s): Secondary | ICD-10-CM | POA: Diagnosis not present

## 2022-11-17 DIAGNOSIS — M79675 Pain in left toe(s): Secondary | ICD-10-CM

## 2022-11-17 DIAGNOSIS — E1151 Type 2 diabetes mellitus with diabetic peripheral angiopathy without gangrene: Secondary | ICD-10-CM | POA: Diagnosis not present

## 2022-11-17 NOTE — Progress Notes (Signed)
Subjective:  Patient ID: Antonio [name], male    DOB: February 05, 1946,  MRN: [id number]  Antonio [name] presents to clinic today for: at risk foot care. Pt has h/o NIDDM with PAD and painful thick toenails that are difficult to trim. Pain interferes with ambulation. Aggravating factors include wearing enclosed shoe gear. Pain is relieved with periodic professional debridement. Followed by [facility] for PAD. Chief Complaint  Patient presents with   Diabetes    BS-264 A1C-7 PCP-10/2022    PCP is Doreene Nest, NP.  No Known Allergies  Review of Systems: Negative except as noted in the HPI.  Objective: No changes noted in today's physical examination. Vitals:   11/17/22 0817 11/17/22 0836  BP: (!) 198/84 (!) 179/85   Antonio [name] is a pleasant 77 y.o. male in NAD. AAO x 3.  Vascular Examination: Capillary refill time <3 seconds b/l LE. Nonpalpable pedal pulses b/l LE. Digital hair absent. No pedal edema b/l. Skin temperature gradient WNL b/l. No varicosities b/l. [name]  Dermatological Examination: Pedal skin with normal turgor, texture and tone b/l. No open wounds. No interdigital macerations b/l. Toenails 1-5 b/l thickened, discolored, dystrophic with subungual debris. There is pain on palpation to dorsal aspect of nailplates. No corns, calluses nor porokeratotic lesions noted..  Neurological Examination: Protective sensation diminished with 10g monofilament b/l.  Musculoskeletal Examination: Muscle strength 5/5 to all lower extremity muscle groups bilaterally. No pain, crepitus or joint limitation noted with ROM bilateral LE. No gross bony deformities bilaterally.     Latest Ref Rng & Units 04/05/2022    3:04 PM  Hemoglobin A1C  Hemoglobin-A1c 4.8 - 5.6 % 8.8    Assessment/Plan: 1. Pain due to onychomycosis of toenails of both feet   2. Type II diabetes mellitus with peripheral circulatory disorder (HCC)    -Consent given for treatment as described below: -Examined  patient. -Discussed elevated blood pressure reading with patient. Patient is asymptomatic on today's visit. Patient has taken blood pressure medication and advised to discuss with PCP/Cardiologist. Patient/Family/Caregiver related understanding. -Continue foot and shoe inspections daily. Monitor blood glucose per PCP/Endocrinologist's recommendations. -Continue supportive shoe gear daily. -Mycotic toenails 1-5 bilaterally were debrided in length and girth with sterile nail nippers and dremel without incident. -Patient/POA to call should there be question/concern in the interim.   Return in about 3 months (around 02/17/2023).  Freddie Breech, DPM

## 2022-11-20 ENCOUNTER — Encounter (HOSPITAL_COMMUNITY): Payer: Medicare Other

## 2022-11-24 ENCOUNTER — Inpatient Hospital Stay: Payer: Medicare Other

## 2022-11-24 ENCOUNTER — Encounter: Payer: Self-pay | Admitting: Oncology

## 2022-11-24 ENCOUNTER — Inpatient Hospital Stay (HOSPITAL_BASED_OUTPATIENT_CLINIC_OR_DEPARTMENT_OTHER): Payer: Medicare Other | Admitting: Oncology

## 2022-11-24 ENCOUNTER — Inpatient Hospital Stay: Payer: Medicare Other | Attending: Oncology

## 2022-11-24 VITALS — Temp 96.7°F

## 2022-11-24 VITALS — BP 168/78 | HR 54 | Temp 94.2°F | Resp 17 | Ht 66.0 in | Wt 182.4 lb

## 2022-11-24 DIAGNOSIS — C3490 Malignant neoplasm of unspecified part of unspecified bronchus or lung: Secondary | ICD-10-CM

## 2022-11-24 DIAGNOSIS — Z79620 Long term (current) use of immunosuppressive biologic: Secondary | ICD-10-CM | POA: Insufficient documentation

## 2022-11-24 DIAGNOSIS — Z5112 Encounter for antineoplastic immunotherapy: Secondary | ICD-10-CM | POA: Insufficient documentation

## 2022-11-24 DIAGNOSIS — Z87891 Personal history of nicotine dependence: Secondary | ICD-10-CM | POA: Insufficient documentation

## 2022-11-24 DIAGNOSIS — C3412 Malignant neoplasm of upper lobe, left bronchus or lung: Secondary | ICD-10-CM | POA: Diagnosis not present

## 2022-11-24 LAB — CMP (CANCER CENTER ONLY)
ALT: 10 U/L (ref 0–44)
AST: 14 U/L — ABNORMAL LOW (ref 15–41)
Albumin: 3.4 g/dL — ABNORMAL LOW (ref 3.5–5.0)
Alkaline Phosphatase: 59 U/L (ref 38–126)
Anion gap: 7 (ref 5–15)
BUN: 21 mg/dL (ref 8–23)
CO2: 22 mmol/L (ref 22–32)
Calcium: 8.7 mg/dL — ABNORMAL LOW (ref 8.9–10.3)
Chloride: 109 mmol/L (ref 98–111)
Creatinine: 0.93 mg/dL (ref 0.61–1.24)
GFR, Estimated: >60 mL/min (ref >60)
Glucose, Bld: 202 mg/dL — ABNORMAL HIGH (ref 70–99)
Potassium: 4.5 mmol/L (ref 3.5–5.1)
Sodium: 138 mmol/L (ref 135–145)
Total Bilirubin: 0.3 mg/dL (ref 0.3–1.2)
Total Protein: 6.3 g/dL — ABNORMAL LOW (ref 6.5–8.1)

## 2022-11-24 LAB — CBC WITH DIFFERENTIAL (CANCER CENTER ONLY)
Abs Immature Granulocytes: 0.03 10*3/uL (ref 0.00–0.07)
Basophils Absolute: 0.1 10*3/uL (ref 0.0–0.1)
Basophils Relative: 1 %
Eosinophils Absolute: 0.5 10*3/uL (ref 0.0–0.5)
Eosinophils Relative: 8 %
HCT: 32.2 % — ABNORMAL LOW (ref 39.0–52.0)
Hemoglobin: 10.2 g/dL — ABNORMAL LOW (ref 13.0–17.0)
Immature Granulocytes: 1 %
Lymphocytes Relative: 10 %
Lymphs Abs: 0.6 10*3/uL — ABNORMAL LOW (ref 0.7–4.0)
MCH: 29.4 pg (ref 26.0–34.0)
MCHC: 31.7 g/dL (ref 30.0–36.0)
MCV: 92.8 fL (ref 80.0–100.0)
Monocytes Absolute: 0.5 10*3/uL (ref 0.1–1.0)
Monocytes Relative: 8 %
Neutro Abs: 4.6 10*3/uL (ref 1.7–7.7)
Neutrophils Relative %: 72 %
Platelet Count: 263 10*3/uL (ref 150–400)
RBC: 3.47 MIL/uL — ABNORMAL LOW (ref 4.22–5.81)
RDW: 13.2 % (ref 11.5–15.5)
WBC Count: 6.4 10*3/uL (ref 4.0–10.5)
nRBC: 0 % (ref 0.0–0.2)

## 2022-11-24 LAB — TSH: TSH: 0.837 u[IU]/mL (ref 0.350–4.500)

## 2022-11-24 MED ORDER — HEPARIN SOD (PORK) LOCK FLUSH 100 UNIT/ML IV SOLN
500.0000 [IU] | Freq: Once | INTRAVENOUS | Status: AC | PRN
  Administered 2022-11-24: 500 [IU]
  Filled 2022-11-24: qty 5

## 2022-11-24 MED ORDER — SODIUM CHLORIDE 0.9 % IV SOLN
Freq: Once | INTRAVENOUS | Status: AC
  Filled 2022-11-24: qty 250

## 2022-11-24 MED ORDER — SODIUM CHLORIDE 0.9 % IV SOLN
1500.0000 mg | Freq: Once | INTRAVENOUS | Status: AC
  Administered 2022-11-24: 1500 mg via INTRAVENOUS
  Filled 2022-11-24: qty 30

## 2022-11-24 NOTE — Progress Notes (Signed)
Hematology/Oncology Consult note [facility]  [phone] [phone]  Patient Care Team: Doreene Nest, NP as PCP - General (Internal Medicine) Debbe Odea, MD as PCP - Cardiology (Cardiology) Duke Salvia, MD as PCP - Electrophysiology (Cardiology) Harle Battiest PA-C as Physician Assistant (Urology) Meryl Dare, MD as Consulting Physician (Gastroenterology) Kathyrn Sheriff, [facility] (Inactive) as Pharmacist (Pharmacist) Glory Buff, RN as Oncology Nurse Navigator Creig Hines, MD as Consulting Physician (Oncology)   Name of the patient: Antonio [name]  [id number]  December 25, 1945   Date of visit: 11/24/22  Diagnosis- locally recurrent adenocarcinoma of the lung   Chief complaint/ Reason for visit-on treatment assessment prior to cycle 3 of maintenance durvalumab  Heme/Onc history: patient is a 77 year old male who was diagnosed with stage I non-small cell lung cancer involving the left upper lobe about 1 year ago and he received SBRT for the same. He has been undergoing surveillance CT scans since then. CT chest in April 2024 showed increasing areas of disease with left-sided mediastinal and hilar lymph nodes and soft tissue along the left main bronchus. 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 No residual focal hypermetabolism in the treated area of the left upper lobe. No evidence of distant metastatic disease. He underwent MRI brain without contrast in February 2024 which did not show any evidence of metastatic disease. Patient was seen by Dr. Jayme Cloud and underwent EBUS guided biopsies left paratracheal lymph node was biopsied and consistent with pulmonary adenocarcinoma.  Patient completed concurrent chemoradiation with weekly CarboTaxol chemotherapy in July 2024.  Not enough tissue available for NGS testing.   Circulogene assay was sent by pulmonary and peripheral blood NGS testing did not show any evidence of EGFR or ALK mutation    Patient started on maintenance durvalumab in August 2024  Interval history-tolerating durvalumab well so far.  Denies any nausea vomiting diarrhea or exertional shortness of breath.  Patient has been monitoring his blood pressure readings at home which has been mostly between 1 40-1 50 per patient's wife.  ECOG PS- 1 Pain scale- 0 Opioid associated constipation- no  Review of systems- Review of Systems  Constitutional:  Negative for chills, fever, malaise/fatigue and weight loss.  HENT:  Negative for congestion, ear discharge and nosebleeds.   Eyes:  Negative for blurred vision.  Respiratory:  Negative for cough, hemoptysis, sputum production, shortness of breath and wheezing.   Cardiovascular:  Negative for chest pain, palpitations, orthopnea and claudication.  Gastrointestinal:  Negative for abdominal pain, blood in stool, constipation, diarrhea, heartburn, melena, nausea and vomiting.  Genitourinary:  Negative for dysuria, flank pain, frequency, hematuria and urgency.  Musculoskeletal:  Negative for back pain, joint pain and myalgias.  Skin:  Negative for rash.  Neurological:  Negative for dizziness, tingling, focal weakness, seizures, weakness and headaches.  Endo/Heme/Allergies:  Does not bruise/bleed easily.  Psychiatric/Behavioral:  Negative for depression and suicidal ideas. The patient does not have insomnia.       No Known Allergies   Past Medical History:  Diagnosis Date   Adenoma of left adrenal gland    Anemia    Aortic atherosclerosis (HCC)    Arthritis    Atrial fibrillation and flutter (HCC)    a.) CHA2DS2VASc = 7 (age x 2, HTN, CVA x 2, vascular disease history, T2DM);  b.) rate/rhythm maintained without pharmacological intervention; chronic antithrombotic therapy  using ASA + clopidogrel   Bilateral carotid artery stenosis    a.) s/p PTA  04/07/2022 --> 90% RICA --> 9x7x30 Exact stent; b.) doppler 03/14/20214: 1-39% RICA, 40-59% LICA   BPH with urinary obstruction    stable on flomax ([name]   Childhood asthma    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   a.) CT head and MRI brain 04/05/2022: chronic cortical/subcortical RIGHT MCA territory infarct within the RIGHT parietal lobe and RIGHT temporoparietal junction   Coronary atherosclerosis of unspecified type of vessel, native or graft    a.) cCTA 05/22/2021: Ca score 1524 (84th percentile for age/sex match control) --> FFRct: dLAD = 0.77, dLCx = 0.67, RPLA = 0.77, dOM1 = 0.66; b.) LHC 05/23/2021: 20% dRCA, 50% OM1 - med mgmt   Diastolic dysfunction    a.) TTE 04/06/2022: EF 60-65%, mod LVH, mild MR, AoV sclerosis, G1DD   DOE (dyspnea on exertion)    Emphysema of lung (HCC)    Erectile dysfunction    a.) on PDE5i (sildenafil)   Esophageal stricture    Fall    Gastric AVM    Gastritis    GERD (gastroesophageal reflux disease)    Hiatal hernia    Hx of colonic polyps    Hyperlipidemia    Hypertension    Internal hemorrhoids without mention of complication    Ischemic leg    Long term current use of antithrombotics/antiplatelets    a.) on DAPT (ASA + clopidogrel)   Lumbago    Mobitz (type) I (Wenckebach's) atrioventricular block    Non-small cell lung cancer (HCC)    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   a.) PCR (+) 05/29/2018, 04/07/2022   Pneumonia    PSA elevation    now averaging 2's   PVD (peripheral vascular disease) with claudication (HCC)    Sigmoid diverticulosis    Tobacco abuse    Type 2 diabetes mellitus treated with insulin [facility])      Past Surgical History:  Procedure Laterality Date   2D Echo  07/2001   BACK SURGERY     CAROTID PTA/STENT INTERVENTION Right 04/07/2022   Procedure: CAROTID PTA/STENT INTERVENTION;  Surgeon: Renford Dills, MD;  Location: [facility] INVASIVE CV LAB;  Service: Cardiovascular;  Laterality: Right;    COLONOSCOPY     ENDOBRONCHIAL ULTRASOUND Left 06/19/2022   Procedure: ENDOBRONCHIAL ULTRASOUND;  Surgeon: Salena Saner, MD;  Location: [facility] ORS;  Service: Pulmonary;  Laterality: Left;   ESOPHAGOGASTRODUODENOSCOPY  02/13/2007   gastritis and duodenitis without bleed   ESOPHAGOGASTRODUODENOSCOPY N/A 06/26/2014   Procedure: ESOPHAGOGASTRODUODENOSCOPY (EGD);  Surgeon: Rachael Fee, MD;  Location[facility];  Service: Endoscopy;  Laterality: N/A;   ESOPHAGOGASTRODUODENOSCOPY (EGD) WITH PROPOFOL N/A 04/08/2020   Procedure: ESOPHAGOGASTRODUODENOSCOPY (EGD) WITH PROPOFOL;  Surgeon: Meryl Dare, MD;  Location: [facility] ENDOSCOPY;  Service: Endoscopy;  Laterality: N/A;   FLEXIBLE BRONCHOSCOPY Left 06/19/2022   Procedure: FLEXIBLE BRONCHOSCOPY;  Surgeon: Salena Saner, MD;  Location: [facility] ORS;  Service: Pulmonary;  Laterality: Left;   HOT HEMOSTASIS N/A 04/08/2020   Procedure: HOT HEMOSTASIS (ARGON PLASMA COAGULATION/BICAP);  Surgeon: Meryl Dare, MD;  Location: [facility] ENDOSCOPY;  Service: Endoscopy;  Laterality: N/A;   IR IMAGING GUIDED PORT INSERTION  06/30/2022   KNEE ARTHROSCOPY Right 11/1998   LEFT HEART CATH AND CORONARY ANGIOGRAPHY N/A 05/23/2021   Procedure: LEFT HEART CATH AND CORONARY ANGIOGRAPHY;  Surgeon: Orbie Pyo, MD;  Location: [facility] INVASIVE CV LAB;  Service: Cardiovascular;  Laterality: N/A;  LOWER EXTREMITY ANGIOGRAPHY Right 12/27/2017   Procedure: LOWER EXTREMITY ANGIOGRAPHY;  Surgeon: Annice Needy, MD;  Location: [facility] INVASIVE CV LAB;  Service: Cardiovascular;  Laterality: Right;   LOWER EXTREMITY ANGIOGRAPHY Left 01/10/2018   Procedure: LOWER EXTREMITY ANGIOGRAPHY;  Surgeon: Annice Needy, MD;  Location: [facility] INVASIVE CV LAB;  Service: Cardiovascular;  Laterality: Left;   LOWER EXTREMITY ANGIOGRAPHY Left 05/29/2018   Procedure: LOWER EXTREMITY ANGIOGRAPHY;  Surgeon: Annice Needy, MD;  Location: [facility] INVASIVE CV LAB;  Service: Cardiovascular;  Laterality: Left;    LOWER EXTREMITY ANGIOGRAPHY Left 05/30/2018   Procedure: Lower Extremity Angiography;  Surgeon: Annice Needy, MD;  Location: [facility] INVASIVE CV LAB;  Service: Cardiovascular;  Laterality: Left;   LP  07/2001   Microwave thermotherapy prostate  08/28/2007   [place]   Persantine cardiolite  04/1999   EF 68%   POLYPECTOMY  12/1993   Stress myoview  08/2004   EF 67%   UPPER GASTROINTESTINAL ENDOSCOPY      Social History   Socioeconomic History   Marital status: Widowed    Spouse name: Armando Reichert   Number of children: 3   Years of education: Not on file   Highest education level: Not on file  Occupational History   Occupation: Retired Music therapist  Tobacco Use   Smoking status: Former    Current packs/day: 0.00    Average packs/day: 2.0 packs/day for 57.3 years (114.5 ttl pk-yrs)    Types: Cigarettes    Start date: 02/07/1964    Quit date: 05/11/2021    Years since quitting: 1.5   Smokeless tobacco: Former    Types: Chew   Tobacco comments:    Quit in 2023- khj  Vaping Use   Vaping status: Never Used  Substance and Sexual Activity   Alcohol use: Yes    Comment: rare   Drug use: No   Sexual activity: Yes  Other Topics Concern   Not on file  Social History Narrative   Married 9/09 wife with moderate memory problems.   3 adult children, 2 grandchildren   Would desire CPR   Social Determinants of Health   Financial Resource Strain: Low Risk  (01/24/2022)   Overall Financial Resource Strain (CARDIA)    Difficulty of Paying Living Expenses: Not hard at all  Food Insecurity: No Food Insecurity (06/26/2022)   Hunger Vital Sign    Worried About Running Out of Food in the Last Year: Never true    Ran Out of Food in the Last Year: Never true  Transportation Needs: No Transportation Needs (06/26/2022)   PRAPARE - Administrator, Civil Service (Medical): No    Lack of Transportation (Non-Medical): No  Physical Activity: Insufficiently Active (01/24/2022)   Exercise Vital Sign     Days of Exercise per Week: 3 days    Minutes of Exercise per Session: 30 min  Stress: No Stress Concern Present (01/24/2022)   [employer] of Occupational Health - Occupational Stress Questionnaire    Feeling of Stress : Not at all  Social Connections: Moderately Isolated (01/24/2022)   Social Connection and Isolation Panel [NHANES]    Frequency of Communication with Friends and Family: More than three times a week    Frequency of Social Gatherings with Friends and Family: Once a week    Attends Religious Services: More than 4 times per year    Active Member of [employer] or Organizations: No    Attends Banker Meetings: Never  Marital Status: Widowed  Intimate Partner Violence: Not At Risk (06/26/2022)   Humiliation, Afraid, Rape, and Kick questionnaire    Fear of Current or Ex-Partner: No    Emotionally Abused: No    Physically Abused: No    Sexually Abused: No    Family History  Problem Relation Age of Onset   Rheum arthritis Mother    Diabetes Father    Alcohol abuse Paternal Uncle    Alcohol abuse Paternal Uncle    Alcohol abuse Paternal Uncle    Heart disease Neg Hx    Stroke Neg Hx    Cancer Neg Hx    Drug abuse Neg Hx    Depression Neg Hx    Colon cancer Neg Hx    Rectal cancer Neg Hx    Stomach cancer Neg Hx    Kidney cancer Neg Hx    Bladder Cancer Neg Hx    Prostate cancer Neg Hx      Current Outpatient Medications:    acetaminophen (TYLENOL) 500 MG tablet, Take 1,000 mg by mouth every 6 (six) hours. Rapid release, Disp: , Rfl:    albuterol (VENTOLIN HFA) 108 (90 Base) MCG/ACT inhaler, Inhale 2 puffs into the lungs every 6 (six) hours as needed for wheezing or shortness of breath., Disp: 8 g, Rfl: 2   atorvastatin (LIPITOR) 20 MG tablet, TAKE 1 TABLET BY MOUTH IN THE EVENING FOR CHOLESTEROL, Disp: 90 tablet, Rfl: 0   clopidogrel (PLAVIX) 75 MG tablet, Take 1 tablet (75 mg total) by mouth daily., Disp: 90 tablet, Rfl: 3   FIASP FLEXTOUCH 100  UNIT/ML FlexTouch Pen, Inject 10 units before lunch meal each day., Disp: , Rfl:    FIBER PO, Take 1 capsule by mouth in the morning., Disp: , Rfl:    finasteride (PROSCAR) 5 MG tablet, Take 1 tablet (5 mg total) by mouth daily. (Patient taking differently: Take 5 mg by mouth every morning.), Disp: 90 tablet, Rfl: 3   Fluticasone-Umeclidin-Vilant (TRELEGY ELLIPTA) 100-62.5-25 MCG/ACT AEPB, Inhale 1 puff into the lungs daily., Disp: 28 each, Rfl: 11   glipiZIDE (GLUCOTROL) 10 MG tablet, Take 10 mg by mouth daily before breakfast., Disp: , Rfl:    GLUCOSAMINE HCL PO, Take 1 tablet by mouth daily., Disp: , Rfl:    insulin aspart (NOVOLOG) 100 UNIT/ML injection, Inject 10 Units into the skin once., Disp: , Rfl:    Insulin Glargine (BASAGLAR KWIKPEN) 100 UNIT/ML, Inject 30 Units into the skin every morning., Disp: , Rfl:    lisinopril (ZESTRIL) 20 MG tablet, Take 1 tablet (20 mg total) by mouth daily. for blood pressure., Disp: 90 tablet, Rfl: 0   metFORMIN (GLUCOPHAGE) 500 MG tablet, TAKE 2 TABLETS BY MOUTH TWICE A DAY WITH MEAL FOR DIABETES (Patient taking differently: Take 1,000 mg by mouth 2 (two) times daily. TAKE 2 TABLETS BY MOUTH TWICE A DAY WITH MEAL FOR DIABETES), Disp: 360 tablet, Rfl: 2   pantoprazole (PROTONIX) 40 MG tablet, TAKE 1 TABLET (40 MG TOTAL) BY MOUTH DAILY. FOR HEARTBURN, Disp: 90 tablet, Rfl: 0   sildenafil (VIAGRA) 100 MG tablet, Take 1 tablet (100 mg total) by mouth daily as needed for erectile dysfunction., Disp: 30 tablet, Rfl: 0   tamsulosin (FLOMAX) 0.4 MG CAPS capsule, Take 1 capsule (0.4 mg total) by mouth 2 (two) times daily., Disp: 180 capsule, Rfl: 3   vitamin B-12 (CYANOCOBALAMIN) 1000 MCG tablet, Take 1,000 mcg by mouth in the morning., Disp: , Rfl:    ferrous gluconate (  FERGON) 324 MG tablet, TAKE 1 TABLET BY MOUTH EVERY DAY WITH BREAKFAST (Patient not taking: Reported on 11/09/2022), Disp: 30 tablet, Rfl: 0  Physical exam:  Vitals:   11/24/22 0848  BP: (!) 171/77   Pulse: (!) 54  Resp: 17  Temp: (!) 94.2 F (34.6 C)  TempSrc: Tympanic  SpO2: 98%  Weight: 182 lb 6.4 oz (82.7 kg)  Height: 5\' 6"  (1.676 m)   Physical Exam Cardiovascular:     Rate and Rhythm: Normal rate and regular rhythm.     Heart sounds: Normal heart sounds.  Pulmonary:     Effort: Pulmonary effort is normal.     Breath sounds: Normal breath sounds.  Skin:    General: Skin is warm and dry.  Neurological:     Mental Status: He is alert and oriented to person, place, and time.         Latest Ref Rng & Units 11/24/2022    8:21 AM  CMP  Glucose 70 - 99 mg/dL 782   BUN 8 - 23 mg/dL 21   Creatinine 9.56 - 1.24 mg/dL 2.13   Sodium 086 - 578 mmol/L 138   Potassium 3.5 - 5.1 mmol/L 4.5   Chloride 98 - 111 mmol/L 109   CO2 22 - 32 mmol/L 22   Calcium 8.9 - 10.3 mg/dL 8.7   Total Protein 6.5 - 8.1 g/dL 6.3   Total Bilirubin 0.3 - 1.2 mg/dL 0.3   Alkaline Phos 38 - 126 U/L 59   AST 15 - 41 U/L 14   ALT 0 - 44 U/L 10       Latest Ref Rng & Units 11/24/2022    8:21 AM  CBC  WBC 4.0 - 10.5 K/uL 6.4   Hemoglobin 13.0 - 17.0 g/dL 46.9   Hematocrit 62.9 - 52.0 % 32.2   Platelets 150 - 400 K/uL 263     No images are attached to the encounter.  NM CARDIAC AMYLOID TUMOR LOC INFLAM SPECT 1 DAY  Result Date: 11/20/2022 CLINICAL DATA:  HEART FAILURE. 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 EXAM: NUCLEAR MEDICINE TUMOR LOCALIZATION. PYP CARDIAC AMYLOIDOSIS SCAN WITH SPECT TECHNIQUE: Following intravenous administration of radiopharmaceutical, anterior planar images of the chest were obtained. Regions of interest were placed on the heart and contralateral chest wall for quantitative assessment. Additional SPECT imaging of the chest was obtained. RADIOPHARMACEUTICALS:  21.5 mCi TC- 4m PYROPHOSPHATE IV FINDINGS: Planar Visual assessment: Anterior planar imaging demonstrates radiotracer uptake within the heart less than or equal 1 to uptake within the adjacent ribs (Grade ). Quantitative  assessment : Quantitative assessment of the cardiac uptake compared to the contralateral chest wall is equal to 1.0 (H/CL = 1.0). SPECT assessment: SPECT imaging of the chest demonstrates minimal radiotracer accumulation within the LEFT ventricle. IMPRESSION: Visual and quantitative assessment (grade 1, H/CL equal 1.0) are NOT suggestive of transthyretin amyloidosis. Electronically Signed   By: Genevive Bi M.D.   On: 11/20/2022 19:11     Assessment and plan- Patient is a 77 y.o. male  with history of stage I non-small cell lung cancer involving the left upper lobe in 2023 now with biopsy-proven mediastinal recurrence.  He is s/p concurrent chemoradiation with weekly CarboTaxol chemotherapy and partial response.  He is here for on treatment assessment prior to cycle 3 of maintenance durvalumab  Counts okay to proceed with cycle 3 of maintenance durvalumab today and I will see him back in 4 weeks for cycle 4.  TSH from today is  pending.  He is due for surveillance scans next week.  Overall tolerating durvalumab well without any significant side effects.  Hypertension: Blood pressure readings in the office have been systolic 170 x 2.  Patient's wife states that his blood pressure at home is usually 1 40-1 50.  Have asked them to get in touch with Dr. Graciela Husbands and we will see if we can get in touch with his primary care doctor as well to see if his blood pressure medicines need to be adjusted.   Visit Diagnosis 1. Recurrent non-small cell lung cancer (HCC)   2. Encounter for antineoplastic immunotherapy      Dr. Owens Shark, MD, MPH [facility] at [facility] [phone] 11/24/2022 8:58 AM

## 2022-11-24 NOTE — Patient Instructions (Signed)
[place] [facility]  Discharge Instructions: Thank you for choosing [facility] to provide your oncology and hematology care.  If you have a lab appointment with the Cancer Center, please go directly to the Cancer Center and check in at the registration area.  Wear comfortable clothing and clothing appropriate for easy access to any Portacath or PICC line.   We strive to give you quality time with your provider. You may need to reschedule your appointment if you arrive late (15 or more minutes).  Arriving late affects you and other patients whose appointments are after yours.  Also, if you miss three or more appointments without notifying the office, you may be dismissed from the clinic at the provider's discretion.      For prescription refill requests, have your pharmacy contact our office and allow 72 hours for refills to be completed.    Today you received the following chemotherapy and/or immunotherapy agents IMFINZI      To help prevent nausea and vomiting after your treatment, we encourage you to take your nausea medication as directed.  BELOW ARE SYMPTOMS THAT SHOULD BE REPORTED IMMEDIATELY: *FEVER GREATER THAN 100.4 F (38 C) OR HIGHER *CHILLS OR SWEATING *NAUSEA AND VOMITING THAT IS NOT CONTROLLED WITH YOUR NAUSEA MEDICATION *UNUSUAL SHORTNESS OF BREATH *UNUSUAL BRUISING OR BLEEDING *URINARY PROBLEMS (pain or burning when urinating, or frequent urination) *BOWEL PROBLEMS (unusual diarrhea, constipation, pain near the anus) TENDERNESS IN MOUTH AND THROAT WITH OR WITHOUT PRESENCE OF ULCERS (sore throat, sores in mouth, or a toothache) UNUSUAL RASH, SWELLING OR PAIN  UNUSUAL VAGINAL DISCHARGE OR ITCHING   Items with * indicate a potential emergency and should be followed up as soon as possible or go to the Emergency Department if any problems should occur.  Please show the CHEMOTHERAPY ALERT CARD or IMMUNOTHERAPY ALERT CARD at check-in to  the Emergency Department and triage nurse.  Should you have questions after your visit or need to cancel or reschedule your appointment, please contact [place] [facility]  [phone] and follow the prompts.  Office hours are 8:00 a.m. to 4:30 p.m. Monday - Friday. Please note that voicemails left after 4:00 p.m. may not be returned until the following business day.  We are closed weekends and major holidays. You have access to a nurse at all times for urgent questions. Please call the main number to the clinic [phone] and follow the prompts.  For any non-urgent questions, you may also contact your provider using MyChart. We now offer e-Visits for anyone 26 and older to request care online for non-urgent symptoms. For details visit [email or web address].   Also download the MyChart app! Go to the app store, search "MyChart", open the app, select [facility], and log in with your MyChart username and password.  Durvalumab Injection What is this medication? DURVALUMAB (dur VAL ue mab) treats some types of cancer. It works by helping your immune system slow or stop the spread of cancer cells. It is a monoclonal antibody. This medicine may be used for other purposes; ask your health care provider or pharmacist if you have questions. COMMON BRAND NAME(S): IMFINZI What should I tell my care team before I take this medication? They need to know if you have any of these conditions: Allogeneic stem cell transplant (uses someone else's stem cells) Autoimmune diseases, such as Crohn disease, ulcerative colitis, lupus History of chest radiation Nervous system problems, such as Guillain-Barre syndrome, myasthenia gravis Organ  transplant An unusual or allergic reaction to durvalumab, other medications, foods, dyes, or preservatives Pregnant or trying to get pregnant Breast-feeding How should I use this medication? This medication is infused into a vein. It is given by  your care team in a hospital or clinic setting. A special MedGuide will be given to you before each treatment. Be sure to read this information carefully each time. Talk to your care team about the use of this medication in children. Special care may be needed. Overdosage: If you think you have taken too much of this medicine contact a poison control center or emergency room at once. NOTE: This medicine is only for you. Do not share this medicine with others. What if I miss a dose? Keep appointments for follow-up doses. It is important not to miss your dose. Call your care team if you are unable to keep an appointment. What may interact with this medication? Interactions have not been studied. This list may not describe all possible interactions. Give your health care provider a list of all the medicines, herbs, non-prescription drugs, or dietary supplements you use. Also tell them if you smoke, drink alcohol, or use illegal drugs. Some items may interact with your medicine. What should I watch for while using this medication? Your condition will be monitored carefully while you are receiving this medication. You may need blood work while taking this medication. This medication may cause serious skin reactions. They can happen weeks to months after starting the medication. Contact your care team right away if you notice fevers or flu-like symptoms with a rash. The rash may be red or purple and then turn into blisters or peeling of the skin. You may also notice a red rash with swelling of the face, lips, or lymph nodes in your neck or under your arms. Tell your care team right away if you have any change in your eyesight. Talk to your care team if you may be pregnant. Serious birth defects can occur if you take this medication during pregnancy and for 3 months after the last dose. You will need a negative pregnancy test before starting this medication. Contraception is recommended while taking this  medication and for 3 months after the last dose. Your care team can help you find the option that works for you. Do not breastfeed while taking this medication and for 3 months after the last dose. What side effects may I notice from receiving this medication? Side effects that you should report to your care team as soon as possible: Allergic reactions--skin rash, itching, hives, swelling of the face, lips, tongue, or throat Dry cough, shortness of breath or trouble breathing Eye pain, redness, irritation, or discharge with blurry or decreased vision Heart muscle inflammation--unusual weakness or fatigue, shortness of breath, chest pain, fast or irregular heartbeat, dizziness, swelling of the ankles, feet, or hands Hormone gland problems--headache, sensitivity to light, unusual weakness or fatigue, dizziness, fast or irregular heartbeat, increased sensitivity to cold or heat, excessive sweating, constipation, hair loss, increased thirst or amount of urine, tremors or shaking, irritability Infusion reactions--chest pain, shortness of breath or trouble breathing, feeling faint or lightheaded Kidney injury (glomerulonephritis)--decrease in the amount of urine, red or dark brown urine, foamy or bubbly urine, swelling of the ankles, hands, or feet Liver injury--right upper belly pain, loss of appetite, nausea, light-colored stool, dark yellow or brown urine, yellowing skin or eyes, unusual weakness or fatigue Pain, tingling, or numbness in the hands or feet, muscle weakness, change  in vision, confusion or trouble speaking, loss of balance or coordination, trouble walking, seizures Rash, fever, and swollen lymph nodes Redness, blistering, peeling, or loosening of the skin, including inside the mouth Sudden or severe stomach pain, bloody diarrhea, fever, nausea, vomiting Side effects that usually do not require medical attention (report these to your care team if they continue or are bothersome): Bone,  joint, or muscle pain Diarrhea Fatigue Loss of appetite Nausea Skin rash This list may not describe all possible side effects. Call your doctor for medical advice about side effects. You may report side effects to FDA at 1-800-FDA-1088. Where should I keep my medication? This medication is given in a hospital or clinic. It will not be stored at home. NOTE: This sheet is a summary. It may not cover all possible information. If you have questions about this medicine, talk to your doctor, pharmacist, or health care provider.  2024 Elsevier/Gold Standard (2021-06-07 00:00:00)

## 2022-11-25 LAB — T4: T4, Total: 9.5 ug/dL (ref 4.5–12.0)

## 2022-12-01 ENCOUNTER — Ambulatory Visit
Admission: RE | Admit: 2022-12-01 | Discharge: 2022-12-01 | Disposition: A | Discharge status: Home / Self Care | Payer: Medicare Other | Source: Ambulatory Visit | Attending: Oncology | Admitting: Oncology

## 2022-12-01 DIAGNOSIS — C3490 Malignant neoplasm of unspecified part of unspecified bronchus or lung: Secondary | ICD-10-CM | POA: Diagnosis not present

## 2022-12-01 DIAGNOSIS — D3502 Benign neoplasm of left adrenal gland: Secondary | ICD-10-CM | POA: Diagnosis not present

## 2022-12-01 DIAGNOSIS — I700 Atherosclerosis of aorta: Secondary | ICD-10-CM | POA: Diagnosis not present

## 2022-12-01 MED ORDER — IOHEXOL 300 MG/ML  SOLN
100.0000 mL | Freq: Once | INTRAMUSCULAR | Status: AC | PRN
  Administered 2022-12-01: 100 mL via INTRAVENOUS

## 2022-12-07 ENCOUNTER — Ambulatory Visit: Payer: Medicare Other | Admitting: Internal Medicine

## 2022-12-07 ENCOUNTER — Encounter: Payer: Self-pay | Admitting: Internal Medicine

## 2022-12-07 ENCOUNTER — Encounter: Payer: Self-pay | Admitting: Gastroenterology

## 2022-12-07 VITALS — BP 116/68 | HR 97 | Temp 98.4°F | Ht 66.0 in | Wt 181.0 lb

## 2022-12-07 DIAGNOSIS — U071 COVID-19: Secondary | ICD-10-CM

## 2022-12-07 DIAGNOSIS — J441 Chronic obstructive pulmonary disease with (acute) exacerbation: Secondary | ICD-10-CM

## 2022-12-07 DIAGNOSIS — R051 Acute cough: Secondary | ICD-10-CM | POA: Diagnosis not present

## 2022-12-07 DIAGNOSIS — J449 Chronic obstructive pulmonary disease, unspecified: Secondary | ICD-10-CM | POA: Insufficient documentation

## 2022-12-07 HISTORY — DX: COVID-19: U07.1

## 2022-12-07 LAB — POCT INFLUENZA A/B
Influenza A, POC: NEGATIVE
Influenza B, POC: NEGATIVE

## 2022-12-07 LAB — POC COVID19 BINAXNOW: SARS Coronavirus 2 Ag: POSITIVE — AB

## 2022-12-07 MED ORDER — NIRMATRELVIR/RITONAVIR (PAXLOVID)TABLET: 3.0000 | ORAL_TABLET | Freq: Two times a day (BID) | ORAL | 0 refills | Status: AC

## 2022-12-07 MED ORDER — PREDNISONE 20 MG PO TABS: 40.0000 mg | ORAL_TABLET | Freq: Every day | ORAL | 0 refills | Status: DC

## 2022-12-07 NOTE — Progress Notes (Signed)
Subjective:    Patient ID: Antonio [name], male    DOB: [birth year], 77 y.o.   MRN: [id number]  HPI Here with partner due to respiratory infection  Started 2 days ago Sinus congestion and cough Fever and aching Some headache Some chills --no sweats Some SOB--no change from baseline No ear pain Some sore throat  Tried dayquil and robitussin--may have helped some  Current Outpatient Medications on File Prior to Visit  Medication Sig Dispense Refill   acetaminophen (TYLENOL) 500 MG tablet Take 1,000 mg by mouth every 6 (six) hours. Rapid release     albuterol (VENTOLIN HFA) 108 (90 Base) MCG/ACT inhaler Inhale 2 puffs into the lungs every 6 (six) hours as needed for wheezing or shortness of breath. 8 g 2   atorvastatin (LIPITOR) 20 MG tablet TAKE 1 TABLET BY MOUTH IN THE EVENING FOR CHOLESTEROL 90 tablet 0   clopidogrel (PLAVIX) 75 MG tablet Take 1 tablet (75 mg total) by mouth daily. 90 tablet 3   ferrous gluconate (FERGON) 324 MG tablet TAKE 1 TABLET BY MOUTH EVERY DAY WITH BREAKFAST 30 tablet 0   FIASP FLEXTOUCH 100 UNIT/ML FlexTouch Pen Inject 10 units before lunch meal each day.     FIBER PO Take 1 capsule by mouth in the morning.     finasteride (PROSCAR) 5 MG tablet Take 1 tablet (5 mg total) by mouth daily. (Patient taking differently: Take 5 mg by mouth every morning.) 90 tablet 3   Fluticasone-Umeclidin-Vilant (TRELEGY ELLIPTA) 100-62.5-25 MCG/ACT AEPB Inhale 1 puff into the lungs daily. 28 each 11   glipiZIDE (GLUCOTROL) 10 MG tablet Take 10 mg by mouth daily before breakfast.     GLUCOSAMINE HCL PO Take 1 tablet by mouth daily.     insulin aspart (NOVOLOG) 100 UNIT/ML injection Inject 10 Units into the skin once.     Insulin Glargine (BASAGLAR KWIKPEN) 100 UNIT/ML Inject 30 Units into the skin every morning.     lisinopril (ZESTRIL) 20 MG tablet Take 1 tablet (20 mg total) by mouth daily. for blood pressure. 90 tablet 0   metFORMIN (GLUCOPHAGE) 500 MG tablet TAKE 2 TABLETS  BY MOUTH TWICE A DAY WITH MEAL FOR DIABETES (Patient taking differently: Take 1,000 mg by mouth 2 (two) times daily. TAKE 2 TABLETS BY MOUTH TWICE A DAY WITH MEAL FOR DIABETES) 360 tablet 2   pantoprazole (PROTONIX) 40 MG tablet TAKE 1 TABLET (40 MG TOTAL) BY MOUTH DAILY. FOR HEARTBURN 90 tablet 0   sildenafil (VIAGRA) 100 MG tablet Take 1 tablet (100 mg total) by mouth daily as needed for erectile dysfunction. 30 tablet 0   tamsulosin (FLOMAX) 0.4 MG CAPS capsule Take 1 capsule (0.4 mg total) by mouth 2 (two) times daily. 180 capsule 3   vitamin B-12 (CYANOCOBALAMIN) 1000 MCG tablet Take 1,000 mcg by mouth in the morning.     No current facility-administered medications on file prior to visit.    No Known Allergies  Past Medical History:  Diagnosis Date   Adenoma of left adrenal gland    Anemia    Aortic atherosclerosis (HCC)    Arthritis    Atrial fibrillation and flutter (HCC)    a.) CHA2DS2VASc = 7 (age x 2, HTN, CVA x 2, vascular disease history, T2DM);  b.) rate/rhythm maintained without pharmacological intervention; chronic antithrombotic therapy using ASA + clopidogrel   Bilateral carotid artery stenosis    a.) s/p PTA 04/07/2022 --> 90% RICA --> 9x7x30 Exact stent; b.) doppler 03/14/20214: 1-39%  RICA, 40-59% LICA   BPH with urinary obstruction    stable on flomax ([name]   Childhood asthma    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   a.) CT head and MRI brain 04/05/2022: chronic cortical/subcortical RIGHT MCA territory infarct within the RIGHT parietal lobe and RIGHT temporoparietal junction   Coronary atherosclerosis of unspecified type of vessel, native or graft    a.) cCTA 05/22/2021: Ca score 1524 (84th percentile for age/sex match control) --> FFRct: dLAD = 0.77, dLCx = 0.67, RPLA = 0.77, dOM1 = 0.66; b.) LHC 05/23/2021: 20% dRCA, 50% OM1 - med mgmt   Diastolic dysfunction    a.) TTE 04/06/2022: EF 60-65%, mod LVH, mild MR, AoV sclerosis, G1DD   DOE (dyspnea on  exertion)    Emphysema of lung (HCC)    Erectile dysfunction    a.) on PDE5i (sildenafil)   Esophageal stricture    Fall    Gastric AVM    Gastritis    GERD (gastroesophageal reflux disease)    Hiatal hernia    Hx of colonic polyps    Hyperlipidemia    Hypertension    Internal hemorrhoids without mention of complication    Ischemic leg    Long term current use of antithrombotics/antiplatelets    a.) on DAPT (ASA + clopidogrel)   Lumbago    Mobitz (type) I (Wenckebach's) atrioventricular block    Non-small cell lung cancer (HCC)    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   a.) PCR (+) 05/29/2018, 04/07/2022   Pneumonia    PSA elevation    now averaging 2's   PVD (peripheral vascular disease) with claudication (HCC)    Sigmoid diverticulosis    Tobacco abuse    Type 2 diabetes mellitus treated with insulin [facility])     Past Surgical History:  Procedure Laterality Date   2D Echo  07/2001   BACK SURGERY     CAROTID PTA/STENT INTERVENTION Right 04/07/2022   Procedure: CAROTID PTA/STENT INTERVENTION;  Surgeon: Renford Dills, MD;  Location: [facility] INVASIVE CV LAB;  Service: Cardiovascular;  Laterality: Right;   COLONOSCOPY     ENDOBRONCHIAL ULTRASOUND Left 06/19/2022   Procedure: ENDOBRONCHIAL ULTRASOUND;  Surgeon: Salena Saner, MD;  Location: [facility] ORS;  Service: Pulmonary;  Laterality: Left;   ESOPHAGOGASTRODUODENOSCOPY  02/13/2007   gastritis and duodenitis without bleed   ESOPHAGOGASTRODUODENOSCOPY N/A 06/26/2014   Procedure: ESOPHAGOGASTRODUODENOSCOPY (EGD);  Surgeon: Rachael Fee, MD;  Location: [facility] ENDOSCOPY;  Service: Endoscopy;  Laterality: N/A;   ESOPHAGOGASTRODUODENOSCOPY (EGD) WITH PROPOFOL N/A 04/08/2020   Procedure: ESOPHAGOGASTRODUODENOSCOPY (EGD) WITH PROPOFOL;  Surgeon: Meryl Dare, MD;  Location: [facility] ENDOSCOPY;  Service: Endoscopy;  Laterality: N/A;   FLEXIBLE BRONCHOSCOPY Left 06/19/2022   Procedure: FLEXIBLE BRONCHOSCOPY;  Surgeon: Salena Saner,  MD;  Location: [facility] ORS;  Service: Pulmonary;  Laterality: Left;   HOT HEMOSTASIS N/A 04/08/2020   Procedure: HOT HEMOSTASIS (ARGON PLASMA COAGULATION/BICAP);  Surgeon: Meryl Dare, MD;  Location: [facility];  Service: Endoscopy;  Laterality: N/A;   IR IMAGING GUIDED PORT INSERTION  06/30/2022   KNEE ARTHROSCOPY Right 11/1998   LEFT HEART CATH AND CORONARY ANGIOGRAPHY N/A 05/23/2021   Procedure: LEFT HEART CATH AND CORONARY ANGIOGRAPHY;  Surgeon: Orbie Pyo, MD;  Location: [facility];  Service: Cardiovascular;  Laterality: N/A;   LOWER EXTREMITY ANGIOGRAPHY Right 12/27/2017   Procedure: LOWER EXTREMITY ANGIOGRAPHY;  Surgeon: Annice Needy, MD;  Location: [facility];  Service: Cardiovascular;  Laterality: Right;  LOWER EXTREMITY ANGIOGRAPHY Left 01/10/2018   Procedure: LOWER EXTREMITY ANGIOGRAPHY;  Surgeon: Annice Needy, MD;  Location: [facility];  Service: Cardiovascular;  Laterality: Left;   LOWER EXTREMITY ANGIOGRAPHY Left 05/29/2018   Procedure: LOWER EXTREMITY ANGIOGRAPHY;  Surgeon: Annice Needy, MD;  Location: [facility];  Service: Cardiovascular;  Laterality: Left;   LOWER EXTREMITY ANGIOGRAPHY Left 05/30/2018   Procedure: Lower Extremity Angiography;  Surgeon: Annice Needy, MD;  Location: [facility];  Service: Cardiovascular;  Laterality: Left;   LP  07/2001   Microwave thermotherapy prostate  08/28/2007   [place]   Persantine cardiolite  04/1999   EF 68%   POLYPECTOMY  12/1993   Stress myoview  08/2004   EF 67%   UPPER GASTROINTESTINAL ENDOSCOPY      Family History  Problem Relation Age of Onset   Rheum arthritis Mother    Diabetes Father    Alcohol abuse Paternal Uncle    Alcohol abuse Paternal Uncle    Alcohol abuse Paternal Uncle    Heart disease Neg Hx    Stroke Neg Hx    Cancer Neg Hx    Drug abuse Neg Hx    Depression Neg Hx    Colon cancer Neg Hx    Rectal cancer Neg Hx    Stomach cancer Neg Hx    Kidney  cancer Neg Hx    Bladder Cancer Neg Hx    Prostate cancer Neg Hx     Social History   Socioeconomic History   Marital status: Widowed    Spouse name: Armando Reichert   Number of children: 3   Years of education: Not on file   Highest education level: Not on file  Occupational History   Occupation: Retired Music therapist  Tobacco Use   Smoking status: Former    Current packs/day: 0.00    Average packs/day: 2.0 packs/day for 57.3 years (114.5 ttl pk-yrs)    Types: Cigarettes    Start date: 02/07/1964    Quit date: 05/11/2021    Years since quitting: 1.5   Smokeless tobacco: Former    Types: Chew   Tobacco comments:    Quit in 2023- khj  Vaping Use   Vaping status: Never Used  Substance and Sexual Activity   Alcohol use: Yes    Comment: rare   Drug use: No   Sexual activity: Yes  Other Topics Concern   Not on file  Social History Narrative   Married 9/09 wife with moderate memory problems.   3 adult children, 2 grandchildren   Would desire CPR   Social Determinants of Health   Financial Resource Strain: Low Risk  (01/24/2022)   Overall Financial Resource Strain (CARDIA)    Difficulty of Paying Living Expenses: Not hard at all  Food Insecurity: No Food Insecurity (06/26/2022)   Hunger Vital Sign    Worried About Running Out of Food in the Last Year: Never true    Ran Out of Food in the Last Year: Never true  Transportation Needs: No Transportation Needs (06/26/2022)   PRAPARE - Administrator, Civil Service (Medical): No    Lack of Transportation (Non-Medical): No  Physical Activity: Insufficiently Active (01/24/2022)   Exercise Vital Sign    Days of Exercise per Week: 3 days    Minutes of Exercise per Session: 30 min  Stress: No Stress Concern Present (01/24/2022)   [employer] of Occupational Health - Occupational Stress Questionnaire  Feeling of Stress : Not at all  Social Connections: Moderately Isolated (01/24/2022)   Social Connection and Isolation Panel  [NHANES]    Frequency of Communication with Friends and Family: More than three times a week    Frequency of Social Gatherings with Friends and Family: Once a week    Attends Religious Services: More than 4 times per year    Active Member of [employer] or Organizations: No    Attends Banker Meetings: Never    Marital Status: Widowed  Intimate Partner Violence: Not At Risk (06/26/2022)   Humiliation, Afraid, Rape, and Kick questionnaire    Fear of Current or Ex-Partner: No    Emotionally Abused: No    Physically Abused: No    Sexually Abused: No   Review of Systems No change in smell or taste No N/V Eating okay     Objective:   Physical Exam Constitutional:      Appearance: Normal appearance.  HENT:     Head:     Comments: No sinus tenderness    Right Ear: Tympanic membrane and ear canal normal.     Left Ear: Tympanic membrane and ear canal normal.     Mouth/Throat:     Pharynx: No oropharyngeal exudate or posterior oropharyngeal erythema.  Pulmonary:     Effort: Pulmonary effort is normal. No respiratory distress.     Breath sounds: No rales.     Comments: Mild expiratory wheezes but not overly tight Musculoskeletal:     Cervical back: Neck supple.  Lymphadenopathy:     Cervical: No cervical adenopathy.  Neurological:     Mental Status: He is alert.            Assessment & Plan:

## 2022-12-07 NOTE — Assessment & Plan Note (Signed)
Mild Will add prednisone to Rx--- 40mg  daily x 3, 20mg  x 3

## 2022-12-07 NOTE — Assessment & Plan Note (Addendum)
COVID positive---flu negative High risk Will treat with paxlovid--normal renal function (stop tamsulosin and atorvastatin). Okay if plavix inhibited for the 5 days Tylenol--cough meds Wont use the sildenafil either

## 2022-12-19 ENCOUNTER — Ambulatory Visit (INDEPENDENT_AMBULATORY_CARE_PROVIDER_SITE_OTHER): Payer: Medicare Other | Admitting: Primary Care

## 2022-12-19 ENCOUNTER — Encounter: Payer: Self-pay | Admitting: Primary Care

## 2022-12-19 VITALS — BP 150/66 | HR 53 | Temp 96.8°F | Ht 66.0 in | Wt 175.0 lb

## 2022-12-19 DIAGNOSIS — I6523 Occlusion and stenosis of bilateral carotid arteries: Secondary | ICD-10-CM | POA: Diagnosis not present

## 2022-12-19 DIAGNOSIS — J449 Chronic obstructive pulmonary disease, unspecified: Secondary | ICD-10-CM | POA: Diagnosis not present

## 2022-12-19 DIAGNOSIS — C3490 Malignant neoplasm of unspecified part of unspecified bronchus or lung: Secondary | ICD-10-CM | POA: Diagnosis not present

## 2022-12-19 DIAGNOSIS — Z794 Long term (current) use of insulin: Secondary | ICD-10-CM | POA: Diagnosis not present

## 2022-12-19 DIAGNOSIS — N138 Other obstructive and reflux uropathy: Secondary | ICD-10-CM

## 2022-12-19 DIAGNOSIS — K2100 Gastro-esophageal reflux disease with esophagitis, without bleeding: Secondary | ICD-10-CM

## 2022-12-19 DIAGNOSIS — E7800 Pure hypercholesterolemia, unspecified: Secondary | ICD-10-CM

## 2022-12-19 DIAGNOSIS — N401 Enlarged prostate with lower urinary tract symptoms: Secondary | ICD-10-CM

## 2022-12-19 DIAGNOSIS — E1165 Type 2 diabetes mellitus with hyperglycemia: Secondary | ICD-10-CM | POA: Diagnosis not present

## 2022-12-19 DIAGNOSIS — I10 Essential (primary) hypertension: Secondary | ICD-10-CM

## 2022-12-19 MED ORDER — LISINOPRIL 30 MG PO TABS: 30.0000 mg | ORAL_TABLET | Freq: Every day | ORAL | 0 refills | Status: DC

## 2022-12-19 NOTE — Progress Notes (Signed)
Subjective:    Patient ID: Antonio [name], male    DOB: September 04, 1945, 77 y.o.   MRN: [id number]  HPI  Antonio [name] is a very pleasant 77 y.o. male with history of hypertension, PAD, complete heart block, lung cancer, COPD, type 2 diabetes, encephalopathy, BPH, hyperlipidemia, iron deficiency anemia, CVA who presents today for follow-up of chronic conditions.  1) Hypertension/PAD/Hyperlipidemia/Complete heart block/Carotid Stenosis: Currently managed on atorvastatin 20 mg daily, clopidogrel 75 mg daily, lisinopril 20 mg daily.  Following with vascular services, last office visit was in September 2024 for follow up visit after right carotid stent placement in March 2024.  Also following with cardiology, last office visit was in June 2024, hydralazine was discontinued. He checks his BP at home which is running 140-160/70-80.   He denies chest pain, headaches.   BP Readings from Last 3 Encounters:  12/19/22 (!) 150/66  12/07/22 116/68  11/24/22 (!) 168/78     2) Non Small Cell Lung Cancer/COPD: Diagnosed approximately 1 year ago.  Following with oncology, last office visit was October 2024.  He completed concurrent chemoradiation weekly in July 2024.  Currently managed on maintenance durvalumab once monthly as of August 2024. His last CT chest/abdomen/pelvis was last week, no findings of recurrent/progressive disease.   He has noticed diarrhea since starting immunotherapy. He has an appointment scheduled for later this week. He plans on updating his oncologist.   Also following with pulmonology for COPD. Currently managed on Trelegy Ellipta 100-62-5.25 daily and albuterol inhaler as needed. Feels well managed overall.  Infrequent use of albuterol.   3) Type 2 Diabetes:  Current medications include: Basaglar 30 units daily, metformin 1000 mg twice daily, NovoLog sliding scale, glipizide 10 mg twice daily.  Following with endocrinology, last office visit was in August 2024, A1C of 7.9.  Novolog was added at lunch.   He is checks his blood sugars continuously with CGM, glucose readings range in the 200s or high 100s mostly.   4) BPH: Following with Urology.  Currently managed on finasteride 5 mg daily, sildenafil 100 mg as needed, tamsulosin 0.4 mg daily.  Doing well. Denies concerns with difficulty urinating, urgency, dysuria, hematuria.   BP Readings from Last 3 Encounters:  12/19/22 (!) 150/66  12/07/22 116/68  11/24/22 (!) 168/78        Review of Systems  Constitutional:  Negative for fever.  Respiratory:  Negative for shortness of breath.   Cardiovascular:  Negative for chest pain.  Gastrointestinal:  Positive for diarrhea. Negative for constipation.  Neurological:  Positive for dizziness. Negative for headaches.  Psychiatric/Behavioral:  The patient is not nervous/anxious.          Past Medical History:  Diagnosis Date   Adenoma of left adrenal gland    Anemia    Aortic atherosclerosis (HCC)    Arthritis    Atrial fibrillation and flutter (HCC)    a.) CHA2DS2VASc = 7 (age x 2, HTN, CVA x 2, vascular disease history, T2DM);  b.) rate/rhythm maintained without pharmacological intervention; chronic antithrombotic therapy using ASA + clopidogrel   Bilateral carotid artery stenosis    a.) s/p PTA 04/07/2022 --> 90% RICA --> 9x7x30 Exact stent; b.) doppler 03/14/20214: 1-39% RICA, 40-59% LICA   BPH with urinary obstruction    stable on flomax ([name]   Childhood asthma    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   a.) CT head and MRI brain 04/05/2022: chronic cortical/subcortical RIGHT MCA territory infarct within the RIGHT parietal lobe  and RIGHT temporoparietal junction   Coronary atherosclerosis of unspecified type of vessel, native or graft    a.) cCTA 05/22/2021: Ca score 1524 (84th percentile for age/sex match control) --> FFRct: dLAD = 0.77, dLCx = 0.67, RPLA = 0.77, dOM1 = 0.66; b.) LHC 05/23/2021: 20% dRCA, 50% OM1 - med mgmt   COVID-19  virus infection 12/07/2022   Diastolic dysfunction    a.) TTE 04/06/2022: EF 60-65%, mod LVH, mild MR, AoV sclerosis, G1DD   DOE (dyspnea on exertion)    Emphysema of lung (HCC)    Erectile dysfunction    a.) on PDE5i (sildenafil)   Esophageal stricture    Fall    Gastric AVM    Gastritis    GERD (gastroesophageal reflux disease)    Hematoma 05/22/2022   Hiatal hernia    Hx of colonic polyps    Hyperlipidemia    Hypertension    Internal hemorrhoids without mention of complication    Ischemic leg    Long term current use of antithrombotics/antiplatelets    a.) on DAPT (ASA + clopidogrel)   Lumbago    Mobitz (type) I (Wenckebach's) atrioventricular block    Non-small cell lung cancer (HCC)    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   a.) PCR (+) 05/29/2018, 04/07/2022   Pain in limb 12/11/2017   Pneumonia    PSA elevation    now averaging 2's   PVD (peripheral vascular disease) with claudication (HCC)    Sigmoid diverticulosis    Tobacco abuse    Type 2 diabetes mellitus treated with insulin (HCC)     Social History   Socioeconomic History   Marital status: Widowed    Spouse name: Armando Reichert   Number of children: 3   Years of education: Not on file   Highest education level: Not on file  Occupational History   Occupation: Retired Music therapist  Tobacco Use   Smoking status: Former    Current packs/day: 0.00    Average packs/day: 2.0 packs/day for 57.3 years (114.5 ttl pk-yrs)    Types: Cigarettes    Start date: 02/07/1964    Quit date: 05/11/2021    Years since quitting: 1.6   Smokeless tobacco: Former    Types: Chew   Tobacco comments:    Quit in 2023- khj  Vaping Use   Vaping status: Never Used  Substance and Sexual Activity   Alcohol use: Yes    Comment: rare   Drug use: No   Sexual activity: Yes  Other Topics Concern   Not on file  Social History Narrative   Married 9/09 wife with moderate memory problems.   3 adult children, 2 grandchildren   Would desire CPR    Social Determinants of Health   Financial Resource Strain: Low Risk  (01/24/2022)   Overall Financial Resource Strain (CARDIA)    Difficulty of Paying Living Expenses: Not hard at all  Food Insecurity: No Food Insecurity (06/26/2022)   Hunger Vital Sign    Worried About Running Out of Food in the Last Year: Never true    Ran Out of Food in the Last Year: Never true  Transportation Needs: No Transportation Needs (06/26/2022)   PRAPARE - Administrator, Civil Service (Medical): No    Lack of Transportation (Non-Medical): No  Physical Activity: Insufficiently Active (01/24/2022)   Exercise Vital Sign    Days of Exercise per Week: 3 days    Minutes of Exercise per Session: 30 min  Stress: No Stress  Concern Present (01/24/2022)   [employer] of Occupational Health - Occupational Stress Questionnaire    Feeling of Stress : Not at all  Social Connections: Moderately Isolated (01/24/2022)   Social Connection and Isolation Panel [NHANES]    Frequency of Communication with Friends and Family: More than three times a week    Frequency of Social Gatherings with Friends and Family: Once a week    Attends Religious Services: More than 4 times per year    Active Member of [employer] or Organizations: No    Attends Banker Meetings: Never    Marital Status: Widowed  Intimate Partner Violence: Not At Risk (06/26/2022)   Humiliation, Afraid, Rape, and Kick questionnaire    Fear of Current or Ex-Partner: No    Emotionally Abused: No    Physically Abused: No    Sexually Abused: No    Past Surgical History:  Procedure Laterality Date   2D Echo  07/2001   BACK SURGERY     CAROTID PTA/STENT INTERVENTION Right 04/07/2022   Procedure: CAROTID PTA/STENT INTERVENTION;  Surgeon: Renford Dills, MD;  Location: [facility] INVASIVE CV [facility];  Service: Cardiovascular;  Laterality: Right;   COLONOSCOPY     ENDOBRONCHIAL ULTRASOUND Left 06/19/2022   Procedure: ENDOBRONCHIAL ULTRASOUND;   Surgeon: Salena Saner, MD;  Location: [facility] ORS;  Service: Pulmonary;  Laterality: Left;   ESOPHAGOGASTRODUODENOSCOPY  02/13/2007   gastritis and duodenitis without bleed   ESOPHAGOGASTRODUODENOSCOPY N/A 06/26/2014   Procedure: ESOPHAGOGASTRODUODENOSCOPY (EGD);  Surgeon: Rachael Fee, MD;  Location: [facility] ENDOSCOPY;  Service: Endoscopy;  Laterality: N/A;   ESOPHAGOGASTRODUODENOSCOPY (EGD) WITH PROPOFOL N/A 04/08/2020   Procedure: ESOPHAGOGASTRODUODENOSCOPY (EGD) WITH PROPOFOL;  Surgeon: Meryl Dare, MD;  Location: [facility] ENDOSCOPY;  Service: Endoscopy;  Laterality: N/A;   FLEXIBLE BRONCHOSCOPY Left 06/19/2022   Procedure: FLEXIBLE BRONCHOSCOPY;  Surgeon: Salena Saner, MD;  Location: [facility] ORS;  Service: Pulmonary;  Laterality: Left;   HOT HEMOSTASIS N/A 04/08/2020   Procedure: HOT HEMOSTASIS (ARGON PLASMA COAGULATION/BICAP);  Surgeon: Meryl Dare, MD;  Location: [facility];  Service: Endoscopy;  Laterality: N/A;   IR IMAGING GUIDED PORT INSERTION  06/30/2022   KNEE ARTHROSCOPY Right 11/1998   LEFT HEART CATH AND CORONARY ANGIOGRAPHY N/A 05/23/2021   Procedure: LEFT HEART CATH AND CORONARY ANGIOGRAPHY;  Surgeon: Orbie Pyo, MD;  Location: [facility];  Service: Cardiovascular;  Laterality: N/A;   LOWER EXTREMITY ANGIOGRAPHY Right 12/27/2017   Procedure: LOWER EXTREMITY ANGIOGRAPHY;  Surgeon: Annice Needy, MD;  Location: [facility];  Service: Cardiovascular;  Laterality: Right;   LOWER EXTREMITY ANGIOGRAPHY Left 01/10/2018   Procedure: LOWER EXTREMITY ANGIOGRAPHY;  Surgeon: Annice Needy, MD;  Location: [facility];  Service: Cardiovascular;  Laterality: Left;   LOWER EXTREMITY ANGIOGRAPHY Left 05/29/2018   Procedure: LOWER EXTREMITY ANGIOGRAPHY;  Surgeon: Annice Needy, MD;  Location: [facility];  Service: Cardiovascular;  Laterality: Left;   LOWER EXTREMITY ANGIOGRAPHY Left 05/30/2018   Procedure: Lower Extremity Angiography;  Surgeon:  Annice Needy, MD;  Location: [facility];  Service: Cardiovascular;  Laterality: Left;   LP  07/2001   Microwave thermotherapy prostate  08/28/2007   [place]   Persantine cardiolite  04/1999   EF 68%   POLYPECTOMY  12/1993   Stress myoview  08/2004   EF 67%   UPPER GASTROINTESTINAL ENDOSCOPY      Family History  Problem Relation Age of Onset   Rheum arthritis Mother  Diabetes Father    Alcohol abuse Paternal Uncle    Alcohol abuse Paternal Uncle    Alcohol abuse Paternal Uncle    Heart disease Neg Hx    Stroke Neg Hx    Cancer Neg Hx    Drug abuse Neg Hx    Depression Neg Hx    Colon cancer Neg Hx    Rectal cancer Neg Hx    Stomach cancer Neg Hx    Kidney cancer Neg Hx    Bladder Cancer Neg Hx    Prostate cancer Neg Hx     No Known Allergies  Current Outpatient Medications on File Prior to Visit  Medication Sig Dispense Refill   acetaminophen (TYLENOL) 500 MG tablet Take 1,000 mg by mouth every 6 (six) hours. Rapid release     albuterol (VENTOLIN HFA) 108 (90 Base) MCG/ACT inhaler Inhale 2 puffs into the lungs every 6 (six) hours as needed for wheezing or shortness of breath. 8 g 2   atorvastatin (LIPITOR) 20 MG tablet TAKE 1 TABLET BY MOUTH IN THE EVENING FOR CHOLESTEROL 90 tablet 0   clopidogrel (PLAVIX) 75 MG tablet Take 1 tablet (75 mg total) by mouth daily. 90 tablet 3   ferrous gluconate (FERGON) 324 MG tablet TAKE 1 TABLET BY MOUTH EVERY DAY WITH BREAKFAST 30 tablet 0   FIASP FLEXTOUCH 100 UNIT/ML FlexTouch Pen Inject 10 units before lunch meal each day.     FIBER PO Take 1 capsule by mouth in the morning.     finasteride (PROSCAR) 5 MG tablet Take 1 tablet (5 mg total) by mouth daily. (Patient taking differently: Take 5 mg by mouth every morning.) 90 tablet 3   Fluticasone-Umeclidin-Vilant (TRELEGY ELLIPTA) 100-62.5-25 MCG/ACT AEPB Inhale 1 puff into the lungs daily. 28 each 11   glipiZIDE (GLUCOTROL) 10 MG tablet Take 10 mg by mouth daily before  breakfast.     GLUCOSAMINE HCL PO Take 1 tablet by mouth daily.     insulin aspart (NOVOLOG) 100 UNIT/ML injection Inject 10 Units into the skin once.     Insulin Glargine (BASAGLAR KWIKPEN) 100 UNIT/ML Inject 30 Units into the skin every morning.     metFORMIN (GLUCOPHAGE) 500 MG tablet TAKE 2 TABLETS BY MOUTH TWICE A DAY WITH MEAL FOR DIABETES (Patient taking differently: Take 1,000 mg by mouth 2 (two) times daily. TAKE 2 TABLETS BY MOUTH TWICE A DAY WITH MEAL FOR DIABETES) 360 tablet 2   pantoprazole (PROTONIX) 40 MG tablet TAKE 1 TABLET (40 MG TOTAL) BY MOUTH DAILY. FOR HEARTBURN 90 tablet 0   sildenafil (VIAGRA) 100 MG tablet Take 1 tablet (100 mg total) by mouth daily as needed for erectile dysfunction. 30 tablet 0   tamsulosin (FLOMAX) 0.4 MG CAPS capsule Take 1 capsule (0.4 mg total) by mouth 2 (two) times daily. 180 capsule 3   vitamin B-12 (CYANOCOBALAMIN) 1000 MCG tablet Take 1,000 mcg by mouth in the morning.     No current facility-administered medications on file prior to visit.    BP (!) 150/66   Pulse (!) 53   Temp (!) 96.8 F (36 C) (Temporal)   Ht 5\' 6"  (1.676 m)   Wt 175 lb (79.4 kg)   SpO2 98%   BMI 28.25 kg/m  Objective:   Physical Exam Cardiovascular:     Rate and Rhythm: Normal rate and regular rhythm.  Pulmonary:     Effort: Pulmonary effort is normal.     Breath sounds: Normal breath sounds.  Abdominal:  Palpations: Abdomen is soft.     Tenderness: There is no abdominal tenderness.  Musculoskeletal:     Cervical back: Neck supple.  Skin:    General: Skin is warm and dry.  Neurological:     Mental Status: He is alert and oriented to person, place, and time.  Psychiatric:        Mood and Affect: Mood normal.           Assessment & Plan:  Essential hypertension Assessment & Plan: Above goal today, also with home readings.  Interestingly, his blood pressure was 116/68 a few weeks ago, but over the last several months his blood pressure has  been above goal.  His home readings seem to match our readings.  Increase lisinopril to 30 mg daily.  He will monitor home BP readings, we will call him in 2 weeks for an update.  Reviewed CMP from October 2024.  Orders: -     Lisinopril; Take 1 tablet (30 mg total) by mouth daily. for blood pressure.  Dispense: 90 tablet; Refill: 0  Bilateral carotid artery stenosis Assessment & Plan: Following with vascular services, reviewed office notes from September 2024.  Continue clopidogrel 75 mg daily, atorvastatin 20 mg daily.  Reviewed lipid panel from August 2024 through Care Everywhere.   Chronic obstructive pulmonary disease, unspecified COPD type [facility]) Assessment & Plan: Following with pulmonology, office notes reviewed from October 2024.  Continue Trelegy inhaler daily and albuterol inhaler as needed.   Non-small cell lung cancer, unspecified laterality [facility]) Assessment & Plan: Following with oncology, office notes reviewed from October 2024. Continue immunotherapy once monthly.  Recommended that he discuss diarrhea side effects with oncology.   Gastroesophageal reflux disease with esophagitis without hemorrhage Assessment & Plan: Controlled.  Continue pantoprazole 40 mg daily.   Type 2 diabetes mellitus with hyperglycemia, with long-term current use of insulin [facility]) Assessment & Plan: Following with endocrinology, labs and office notes reviewed from August 2024 through Care Everywhere.  Continue Basaglar 30 units daily, NovoLog 10 units with lunch, metformin 1000 mg twice daily, glipizide 10 mg twice daily.   BPH with obstruction/lower urinary tract symptoms Assessment & Plan: Improved. Following with urology.  Continue finasteride 5 mg daily, tamsulosin 0.4 mg daily.   Pure hypercholesterolemia Assessment & Plan: Reviewed lipid panel from July 2024. Continue atorvastatin 20 mg daily.         Doreene Nest, NP

## 2022-12-19 NOTE — Assessment & Plan Note (Signed)
Improved. Following with urology.  Continue finasteride 5 mg daily, tamsulosin 0.4 mg daily.

## 2022-12-19 NOTE — Assessment & Plan Note (Addendum)
Above goal today, also with home readings.  Interestingly, his blood pressure was 116/68 a few weeks ago, but over the last several months his blood pressure has been above goal.  His home readings seem to match our readings.  Increase lisinopril to 30 mg daily.  He will monitor home BP readings, we will call him in 2 weeks for an update.  Reviewed CMP from October 2024.

## 2022-12-19 NOTE — Assessment & Plan Note (Signed)
Following with oncology, office notes reviewed from October 2024. Continue immunotherapy once monthly.  Recommended that he discuss diarrhea side effects with oncology.

## 2022-12-19 NOTE — Patient Instructions (Signed)
We increased your lisinopril blood pressure medication to 30 mg daily.  Keep checking her blood pressure readings at home.  We will call you in 2 weeks for readings.  It was a pleasure to see you today!

## 2022-12-19 NOTE — Assessment & Plan Note (Signed)
Following with vascular services, reviewed office notes from September 2024.  Continue clopidogrel 75 mg daily, atorvastatin 20 mg daily.  Reviewed lipid panel from August 2024 through Care Everywhere.

## 2022-12-19 NOTE — Assessment & Plan Note (Signed)
Controlled.  Continue pantoprazole 40 mg daily. 

## 2022-12-19 NOTE — Assessment & Plan Note (Signed)
Reviewed lipid panel from July 2024. Continue atorvastatin 20 mg daily.

## 2022-12-19 NOTE — Assessment & Plan Note (Signed)
Following with pulmonology, office notes reviewed from October 2024.  Continue Trelegy inhaler daily and albuterol inhaler as needed.

## 2022-12-19 NOTE — Assessment & Plan Note (Signed)
Following with endocrinology, labs and office notes reviewed from August 2024 through Care Everywhere.  Continue Basaglar 30 units daily, NovoLog 10 units with lunch, metformin 1000 mg twice daily, glipizide 10 mg twice daily.

## 2022-12-22 ENCOUNTER — Inpatient Hospital Stay (HOSPITAL_BASED_OUTPATIENT_CLINIC_OR_DEPARTMENT_OTHER): Payer: Medicare Other | Attending: Oncology | Admitting: Oncology

## 2022-12-22 ENCOUNTER — Encounter: Payer: Self-pay | Admitting: Oncology

## 2022-12-22 ENCOUNTER — Inpatient Hospital Stay: Payer: Medicare Other

## 2022-12-22 ENCOUNTER — Inpatient Hospital Stay: Payer: Medicare Other | Attending: Oncology

## 2022-12-22 VITALS — BP 158/74 | HR 95 | Temp 97.3°F | Resp 18 | Wt 175.5 lb

## 2022-12-22 DIAGNOSIS — Z5112 Encounter for antineoplastic immunotherapy: Secondary | ICD-10-CM | POA: Insufficient documentation

## 2022-12-22 DIAGNOSIS — C3490 Malignant neoplasm of unspecified part of unspecified bronchus or lung: Secondary | ICD-10-CM

## 2022-12-22 DIAGNOSIS — Z9221 Personal history of antineoplastic chemotherapy: Secondary | ICD-10-CM | POA: Insufficient documentation

## 2022-12-22 DIAGNOSIS — Z87891 Personal history of nicotine dependence: Secondary | ICD-10-CM | POA: Insufficient documentation

## 2022-12-22 DIAGNOSIS — C3412 Malignant neoplasm of upper lobe, left bronchus or lung: Secondary | ICD-10-CM | POA: Diagnosis not present

## 2022-12-22 DIAGNOSIS — Z79899 Other long term (current) drug therapy: Secondary | ICD-10-CM | POA: Diagnosis not present

## 2022-12-22 DIAGNOSIS — Z923 Personal history of irradiation: Secondary | ICD-10-CM | POA: Insufficient documentation

## 2022-12-22 LAB — CBC WITH DIFFERENTIAL (CANCER CENTER ONLY)
Abs Immature Granulocytes: 0.07 10*3/uL (ref 0.00–0.07)
Basophils Absolute: 0.1 10*3/uL (ref 0.0–0.1)
Basophils Relative: 1 %
Eosinophils Absolute: 0.2 10*3/uL (ref 0.0–0.5)
Eosinophils Relative: 3 %
HCT: 37 % — ABNORMAL LOW (ref 39.0–52.0)
Hemoglobin: 11.8 g/dL — ABNORMAL LOW (ref 13.0–17.0)
Immature Granulocytes: 1 %
Lymphocytes Relative: 9 %
Lymphs Abs: 0.8 10*3/uL (ref 0.7–4.0)
MCH: 29.1 pg (ref 26.0–34.0)
MCHC: 31.9 g/dL (ref 30.0–36.0)
MCV: 91.1 fL (ref 80.0–100.0)
Monocytes Absolute: 0.7 10*3/uL (ref 0.1–1.0)
Monocytes Relative: 8 %
Neutro Abs: 7.1 10*3/uL (ref 1.7–7.7)
Neutrophils Relative %: 78 %
Platelet Count: 263 10*3/uL (ref 150–400)
RBC: 4.06 MIL/uL — ABNORMAL LOW (ref 4.22–5.81)
RDW: 13.4 % (ref 11.5–15.5)
WBC Count: 9 10*3/uL (ref 4.0–10.5)
nRBC: 0 % (ref 0.0–0.2)

## 2022-12-22 LAB — CMP (CANCER CENTER ONLY)
ALT: 11 U/L (ref 0–44)
AST: 15 U/L (ref 15–41)
Albumin: 3.6 g/dL (ref 3.5–5.0)
Alkaline Phosphatase: 54 U/L (ref 38–126)
Anion gap: 9 (ref 5–15)
BUN: 25 mg/dL — ABNORMAL HIGH (ref 8–23)
CO2: 18 mmol/L — ABNORMAL LOW (ref 22–32)
Calcium: 8.9 mg/dL (ref 8.9–10.3)
Chloride: 113 mmol/L — ABNORMAL HIGH (ref 98–111)
Creatinine: 1.08 mg/dL (ref 0.61–1.24)
GFR, Estimated: >60 mL/min (ref >60)
Glucose, Bld: 169 mg/dL — ABNORMAL HIGH (ref 70–99)
Potassium: 5.3 mmol/L — ABNORMAL HIGH (ref 3.5–5.1)
Sodium: 140 mmol/L (ref 135–145)
Total Bilirubin: 0.3 mg/dL (ref <1.2)
Total Protein: 6.7 g/dL (ref 6.5–8.1)

## 2022-12-22 MED ORDER — DURVALUMAB 500 MG/10ML IV SOLN
1500.0000 mg | Freq: Once | INTRAVENOUS | Status: AC
  Administered 2022-12-22: 1500 mg via INTRAVENOUS
  Filled 2022-12-22: qty 30

## 2022-12-22 MED ORDER — SODIUM CHLORIDE 0.9 % IV SOLN
Freq: Once | INTRAVENOUS | Status: AC
  Filled 2022-12-22: qty 250

## 2022-12-22 MED ORDER — HEPARIN SOD (PORK) LOCK FLUSH 100 UNIT/ML IV SOLN
500.0000 [IU] | Freq: Once | INTRAVENOUS | Status: AC | PRN
  Administered 2022-12-22: 500 [IU]
  Filled 2022-12-22: qty 5

## 2022-12-22 MED ORDER — SODIUM CHLORIDE 0.9% FLUSH
10.0000 mL | INTRAVENOUS | Status: DC | PRN
  Administered 2022-12-22: 10 mL
  Filled 2022-12-22: qty 10

## 2022-12-22 NOTE — Patient Instructions (Signed)
 [facility] - A DEPT OF [facility]  Discharge Instructions: Thank you for choosing [facility] to provide your oncology and hematology care.  If you have a lab appointment with the Cancer Center, please go directly to the Cancer Center and check in at the registration area.  Wear comfortable clothing and clothing appropriate for easy access to any Portacath or PICC line.   We strive to give you quality time with your provider. You may need to reschedule your appointment if you arrive late (15 or more minutes).  Arriving late affects you and other patients whose appointments are after yours.  Also, if you miss three or more appointments without notifying the office, you may be dismissed from the clinic at the provider's discretion.      For prescription refill requests, have your pharmacy contact our office and allow 72 hours for refills to be completed.    Today you received the following chemotherapy and/or immunotherapy agents Durvalumab.      To help prevent nausea and vomiting after your treatment, we encourage you to take your nausea medication as directed.  BELOW ARE SYMPTOMS THAT SHOULD BE REPORTED IMMEDIATELY: *FEVER GREATER THAN 100.4 F (38 C) OR HIGHER *CHILLS OR SWEATING *NAUSEA AND VOMITING THAT IS NOT CONTROLLED WITH YOUR NAUSEA MEDICATION *UNUSUAL SHORTNESS OF BREATH *UNUSUAL BRUISING OR BLEEDING *URINARY PROBLEMS (pain or burning when urinating, or frequent urination) *BOWEL PROBLEMS (unusual diarrhea, constipation, pain near the anus) TENDERNESS IN MOUTH AND THROAT WITH OR WITHOUT PRESENCE OF ULCERS (sore throat, sores in mouth, or a toothache) UNUSUAL RASH, SWELLING OR PAIN  UNUSUAL VAGINAL DISCHARGE OR ITCHING   Items with * indicate a potential emergency and should be followed up as soon as possible or go to the Emergency Department if any problems should occur.  Please show the CHEMOTHERAPY ALERT CARD or IMMUNOTHERAPY  ALERT CARD at check-in to the Emergency Department and triage nurse.  Should you have questions after your visit or need to cancel or reschedule your appointment, please contact [facility] - A DEPT OF [facility]  [phone] and follow the prompts.  Office hours are 8:00 a.m. to 4:30 p.m. Monday - Friday. Please note that voicemails left after 4:00 p.m. may not be returned until the following business day.  We are closed weekends and major holidays. You have access to a nurse at all times for urgent questions. Please call the main number to the clinic [phone] and follow the prompts.  For any non-urgent questions, you may also contact your provider using MyChart. We now offer e-Visits for anyone 54 and older to request care online for non-urgent symptoms. For details visit [email or web address].   Also download the MyChart app! Go to the app store, search "MyChart", open the app, select , and log in with your MyChart username and password.

## 2022-12-22 NOTE — Progress Notes (Signed)
Hematology/Oncology Consult note [facility]  [phone] Fax:([phone]  Patient Care Team: Doreene Nest, NP as PCP - General (Internal Medicine) Debbe Odea, MD as PCP - Cardiology (Cardiology) Duke Salvia, MD as PCP - Electrophysiology (Cardiology) Harle Battiest PA-C as Physician Assistant (Urology) Meryl Dare, MD as Consulting Physician (Gastroenterology) Kathyrn Sheriff, [facility] (Inactive) as Pharmacist (Pharmacist) Glory Buff, RN as Oncology Nurse Navigator Creig Hines, MD as Consulting Physician (Oncology)   Name of the patient: Antonio [name]  [id number]  06/09/1945   Date of visit: 12/22/22  Diagnosis- locally recurrent adenocarcinoma of the lung   Chief complaint/ Reason for visit-on treatment assessment prior to cycle 4 of maintenance durvalumab  Heme/Onc history:  patient is a 77 year old male who was diagnosed with stage I non-small cell lung cancer involving the left upper lobe about 1 year ago and he received SBRT for the same. He has been undergoing surveillance CT scans since then. CT chest in April 2024 showed increasing areas of disease with left-sided mediastinal and hilar lymph nodes and soft tissue along the left main bronchus. 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 No residual focal hypermetabolism in the treated area of the left upper lobe. No evidence of distant metastatic disease. He underwent MRI brain without contrast in February 2024 which did not show any evidence of metastatic disease. Patient was seen by Dr. Jayme Cloud and underwent EBUS guided biopsies left paratracheal lymph node was biopsied and consistent with pulmonary adenocarcinoma.  Patient completed concurrent chemoradiation with weekly CarboTaxol chemotherapy in July 2024.  Not enough tissue available for NGS testing.   Circulogene assay was sent by pulmonary and peripheral blood NGS testing did not show any evidence of EGFR or ALK mutation    Patient started on maintenance durvalumab in August 2024    Interval history-he is doing well overall today.  Denies any cough or shortness of breath.  ECOG PS- 1 Pain scale- 0   Review of systems- Review of Systems  Constitutional:  Negative for chills, fever, malaise/fatigue and weight loss.  HENT:  Negative for congestion, ear discharge and nosebleeds.   Eyes:  Negative for blurred vision.  Respiratory:  Negative for cough, hemoptysis, sputum production, shortness of breath and wheezing.   Cardiovascular:  Negative for chest pain, palpitations, orthopnea and claudication.  Gastrointestinal:  Negative for abdominal pain, blood in stool, constipation, diarrhea, heartburn, melena, nausea and vomiting.  Genitourinary:  Negative for dysuria, flank pain, frequency, hematuria and urgency.  Musculoskeletal:  Negative for back pain, joint pain and myalgias.  Skin:  Negative for rash.  Neurological:  Negative for dizziness, tingling, focal weakness, seizures, weakness and headaches.  Endo/Heme/Allergies:  Does not bruise/bleed easily.  Psychiatric/Behavioral:  Negative for depression and suicidal ideas. The patient does not have insomnia.       No Known Allergies   Past Medical History:  Diagnosis Date   Adenoma of left adrenal gland    Anemia    Aortic atherosclerosis (HCC)    Arthritis    Atrial fibrillation and flutter (HCC)    a.) CHA2DS2VASc = 7 (age x 2, HTN, CVA x 2, vascular disease history, T2DM);  b.) rate/rhythm maintained without pharmacological intervention; chronic antithrombotic therapy using ASA + clopidogrel   Bilateral carotid artery stenosis    a.) s/p PTA 04/07/2022 --> 90% RICA --> 9x7x30 Exact stent;  b.) doppler 03/14/20214: 1-39% RICA, 40-59% LICA   BPH with urinary obstruction    stable on flomax ([name]   Childhood asthma     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   a.) CT head and MRI brain 04/05/2022: chronic cortical/subcortical RIGHT MCA territory infarct within the RIGHT parietal lobe and RIGHT temporoparietal junction   Coronary atherosclerosis of unspecified type of vessel, native or graft    a.) cCTA 05/22/2021: Ca score 1524 (84th percentile for age/sex match control) --> FFRct: dLAD = 0.77, dLCx = 0.67, RPLA = 0.77, dOM1 = 0.66; b.) LHC 05/23/2021: 20% dRCA, 50% OM1 - med mgmt   COVID-19 virus infection 12/07/2022   Diastolic dysfunction    a.) TTE 04/06/2022: EF 60-65%, mod LVH, mild MR, AoV sclerosis, G1DD   DOE (dyspnea on exertion)    Emphysema of lung (HCC)    Erectile dysfunction    a.) on PDE5i (sildenafil)   Esophageal stricture    Fall    Gastric AVM    Gastritis    GERD (gastroesophageal reflux disease)    Hematoma 05/22/2022   Hiatal hernia    Hx of colonic polyps    Hyperlipidemia    Hypertension    Internal hemorrhoids without mention of complication    Ischemic leg    Long term current use of antithrombotics/antiplatelets    a.) on DAPT (ASA + clopidogrel)   Lumbago    Mobitz (type) I (Wenckebach's) atrioventricular block    Non-small cell lung cancer (HCC)    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   a.) PCR (+) 05/29/2018, 04/07/2022   Pain in limb 12/11/2017   Pneumonia    PSA elevation    now averaging 2's   PVD (peripheral vascular disease) with claudication (HCC)    Sigmoid diverticulosis    Tobacco abuse    Type 2 diabetes mellitus treated with insulin [facility])      Past Surgical History:  Procedure Laterality Date   2D Echo  07/2001   BACK SURGERY     CAROTID PTA/STENT INTERVENTION Right 04/07/2022   Procedure: CAROTID PTA/STENT INTERVENTION;  Surgeon: Renford Dills, MD;  Location: [facility] INVASIVE CV LAB;  Service: Cardiovascular;  Laterality: Right;   COLONOSCOPY     ENDOBRONCHIAL ULTRASOUND Left 06/19/2022   Procedure: ENDOBRONCHIAL ULTRASOUND;  Surgeon:  Salena Saner, MD;  Location: [facility] ORS;  Service: Pulmonary;  Laterality: Left;   ESOPHAGOGASTRODUODENOSCOPY  02/13/2007   gastritis and duodenitis without bleed   ESOPHAGOGASTRODUODENOSCOPY N/A 06/26/2014   Procedure: ESOPHAGOGASTRODUODENOSCOPY (EGD);  Surgeon: Rachael Fee, MD;  Location[facility] ENDOSCOPY;  Service: Endoscopy;  Laterality: N/A;   ESOPHAGOGASTRODUODENOSCOPY (EGD) WITH PROPOFOL N/A 04/08/2020   Procedure: ESOPHAGOGASTRODUODENOSCOPY (EGD) WITH PROPOFOL;  Surgeon: Meryl Dare, MD;  Location: [facility] ENDOSCOPY;  Service: Endoscopy;  Laterality: N/A;   FLEXIBLE BRONCHOSCOPY Left 06/19/2022   Procedure: FLEXIBLE BRONCHOSCOPY;  Surgeon: Salena Saner, MD;  Location: [facility] ORS;  Service: Pulmonary;  Laterality: Left;   HOT HEMOSTASIS N/A 04/08/2020   Procedure: HOT HEMOSTASIS (ARGON PLASMA COAGULATION/BICAP);  Surgeon: Meryl Dare, MD;  Location: [facility] ENDOSCOPY;  Service: Endoscopy;  Laterality: N/A;   IR IMAGING GUIDED PORT INSERTION  06/30/2022   KNEE ARTHROSCOPY Right 11/1998   LEFT HEART CATH AND CORONARY ANGIOGRAPHY N/A 05/23/2021   Procedure: LEFT HEART CATH AND CORONARY ANGIOGRAPHY;  Surgeon: Orbie Pyo, MD;  Location: [facility] INVASIVE CV LAB;  Service: Cardiovascular;  Laterality: N/A;   LOWER EXTREMITY ANGIOGRAPHY Right 12/27/2017   Procedure:  LOWER EXTREMITY ANGIOGRAPHY;  Surgeon: Annice Needy, MD;  Location: [facility] INVASIVE CV LAB;  Service: Cardiovascular;  Laterality: Right;   LOWER EXTREMITY ANGIOGRAPHY Left 01/10/2018   Procedure: LOWER EXTREMITY ANGIOGRAPHY;  Surgeon: Annice Needy, MD;  Location: [facility] INVASIVE CV LAB;  Service: Cardiovascular;  Laterality: Left;   LOWER EXTREMITY ANGIOGRAPHY Left 05/29/2018   Procedure: LOWER EXTREMITY ANGIOGRAPHY;  Surgeon: Annice Needy, MD;  Location: [facility] INVASIVE CV LAB;  Service: Cardiovascular;  Laterality: Left;   LOWER EXTREMITY ANGIOGRAPHY Left 05/30/2018   Procedure: Lower Extremity Angiography;  Surgeon: Annice Needy, MD;  Location: [facility] INVASIVE CV LAB;  Service: Cardiovascular;  Laterality: Left;   LP  07/2001   Microwave thermotherapy prostate  08/28/2007   [place]   Persantine cardiolite  04/1999   EF 68%   POLYPECTOMY  12/1993   Stress myoview  08/2004   EF 67%   UPPER GASTROINTESTINAL ENDOSCOPY      Social History   Socioeconomic History   Marital status: Widowed    Spouse name: Armando Reichert   Number of children: 3   Years of education: Not on file   Highest education level: Not on file  Occupational History   Occupation: Retired Music therapist  Tobacco Use   Smoking status: Former    Current packs/day: 0.00    Average packs/day: 2.0 packs/day for 57.3 years (114.5 ttl pk-yrs)    Types: Cigarettes    Start date: 02/07/1964    Quit date: 05/11/2021    Years since quitting: 1.6   Smokeless tobacco: Former    Types: Chew   Tobacco comments:    Quit in 2023- khj  Vaping Use   Vaping status: Never Used  Substance and Sexual Activity   Alcohol use: Yes    Comment: rare   Drug use: No   Sexual activity: Yes  Other Topics Concern   Not on file  Social History Narrative   Married 9/09 wife with moderate memory problems.   3 adult children, 2 grandchildren   Would desire CPR   Social Determinants of Health   Financial Resource Strain: Low Risk  (01/24/2022)   Overall Financial Resource Strain (CARDIA)    Difficulty of Paying Living Expenses: Not hard at all  Food Insecurity: No Food Insecurity (06/26/2022)   Hunger Vital Sign    Worried About Running Out of Food in the Last Year: Never true    Ran Out of Food in the Last Year: Never true  Transportation Needs: No Transportation Needs (06/26/2022)   PRAPARE - Administrator, Civil Service (Medical): No    Lack of Transportation (Non-Medical): No  Physical Activity: Insufficiently Active (01/24/2022)   Exercise Vital Sign    Days of Exercise per Week: 3 days    Minutes of Exercise per Session: 30 min  Stress: No Stress Concern  Present (01/24/2022)   [employer] of Occupational Health - Occupational Stress Questionnaire    Feeling of Stress : Not at all  Social Connections: Moderately Isolated (01/24/2022)   Social Connection and Isolation Panel [NHANES]    Frequency of Communication with Friends and Family: More than three times a week    Frequency of Social Gatherings with Friends and Family: Once a week    Attends Religious Services: More than 4 times per year    Active Member of [employer] or Organizations: No    Attends Banker Meetings: Never    Marital Status: Widowed  Intimate Partner Violence: Not  At Risk (06/26/2022)   Humiliation, Afraid, Rape, and Kick questionnaire    Fear of Current or Ex-Partner: No    Emotionally Abused: No    Physically Abused: No    Sexually Abused: No    Family History  Problem Relation Age of Onset   Rheum arthritis Mother    Diabetes Father    Alcohol abuse Paternal Uncle    Alcohol abuse Paternal Uncle    Alcohol abuse Paternal Uncle    Heart disease Neg Hx    Stroke Neg Hx    Cancer Neg Hx    Drug abuse Neg Hx    Depression Neg Hx    Colon cancer Neg Hx    Rectal cancer Neg Hx    Stomach cancer Neg Hx    Kidney cancer Neg Hx    Bladder Cancer Neg Hx    Prostate cancer Neg Hx      Current Outpatient Medications:    acetaminophen (TYLENOL) 500 MG tablet, Take 1,000 mg by mouth every 6 (six) hours. Rapid release, Disp: , Rfl:    albuterol (VENTOLIN HFA) 108 (90 Base) MCG/ACT inhaler, Inhale 2 puffs into the lungs every 6 (six) hours as needed for wheezing or shortness of breath., Disp: 8 g, Rfl: 2   atorvastatin (LIPITOR) 20 MG tablet, TAKE 1 TABLET BY MOUTH IN THE EVENING FOR CHOLESTEROL, Disp: 90 tablet, Rfl: 0   clopidogrel (PLAVIX) 75 MG tablet, Take 1 tablet (75 mg total) by mouth daily., Disp: 90 tablet, Rfl: 3   ferrous gluconate (FERGON) 324 MG tablet, TAKE 1 TABLET BY MOUTH EVERY DAY WITH BREAKFAST, Disp: 30 tablet, Rfl: 0   FIASP  FLEXTOUCH 100 UNIT/ML FlexTouch Pen, Inject 10 units before lunch meal each day., Disp: , Rfl:    FIBER PO, Take 1 capsule by mouth in the morning., Disp: , Rfl:    finasteride (PROSCAR) 5 MG tablet, Take 1 tablet (5 mg total) by mouth daily. (Patient taking differently: Take 5 mg by mouth every morning.), Disp: 90 tablet, Rfl: 3   Fluticasone-Umeclidin-Vilant (TRELEGY ELLIPTA) 100-62.5-25 MCG/ACT AEPB, Inhale 1 puff into the lungs daily., Disp: 28 each, Rfl: 11   glipiZIDE (GLUCOTROL) 10 MG tablet, Take 10 mg by mouth daily before breakfast., Disp: , Rfl:    GLUCOSAMINE HCL PO, Take 1 tablet by mouth daily., Disp: , Rfl:    insulin aspart (NOVOLOG) 100 UNIT/ML injection, Inject 10 Units into the skin once., Disp: , Rfl:    Insulin Glargine (BASAGLAR KWIKPEN) 100 UNIT/ML, Inject 30 Units into the skin every morning., Disp: , Rfl:    lisinopril (ZESTRIL) 30 MG tablet, Take 1 tablet (30 mg total) by mouth daily. for blood pressure., Disp: 90 tablet, Rfl: 0   metFORMIN (GLUCOPHAGE) 500 MG tablet, TAKE 2 TABLETS BY MOUTH TWICE A DAY WITH MEAL FOR DIABETES (Patient taking differently: Take 1,000 mg by mouth 2 (two) times daily. TAKE 2 TABLETS BY MOUTH TWICE A DAY WITH MEAL FOR DIABETES), Disp: 360 tablet, Rfl: 2   pantoprazole (PROTONIX) 40 MG tablet, TAKE 1 TABLET (40 MG TOTAL) BY MOUTH DAILY. FOR HEARTBURN, Disp: 90 tablet, Rfl: 0   sildenafil (VIAGRA) 100 MG tablet, Take 1 tablet (100 mg total) by mouth daily as needed for erectile dysfunction., Disp: 30 tablet, Rfl: 0   tamsulosin (FLOMAX) 0.4 MG CAPS capsule, Take 1 capsule (0.4 mg total) by mouth 2 (two) times daily., Disp: 180 capsule, Rfl: 3   vitamin B-12 (CYANOCOBALAMIN) 1000 MCG tablet, Take 1,000  mcg by mouth in the morning., Disp: , Rfl:   Physical exam:  Vitals:   12/22/22 0859  Weight: 175 lb 8 oz (79.6 kg)   Physical Exam Cardiovascular:     Rate and Rhythm: Normal rate and regular rhythm.     Heart sounds: Normal heart sounds.   Pulmonary:     Effort: Pulmonary effort is normal.     Breath sounds: Normal breath sounds.  Skin:    General: Skin is warm and dry.  Neurological:     Mental Status: He is alert and oriented to person, place, and time.         Latest Ref Rng & Units 12/22/2022    8:27 AM  CMP  Glucose 70 - 99 mg/dL 846   BUN 8 - 23 mg/dL 25   Creatinine 9.62 - 1.24 mg/dL 9.52   Sodium 841 - 324 mmol/L 140   Potassium 3.5 - 5.1 mmol/L 5.3   Chloride 98 - 111 mmol/L 113   CO2 22 - 32 mmol/L 18   Calcium 8.9 - 10.3 mg/dL 8.9   Total Protein 6.5 - 8.1 g/dL 6.7   Total Bilirubin <4.0 mg/dL 0.3   Alkaline Phos 38 - 126 U/L 54   AST 15 - 41 U/L 15   ALT 0 - 44 U/L 11       Latest Ref Rng & Units 12/22/2022    8:27 AM  CBC  WBC 4.0 - 10.5 K/uL 9.0   Hemoglobin 13.0 - 17.0 g/dL 10.2   Hematocrit 72.5 - 52.0 % 37.0   Platelets 150 - 400 K/uL 263     No images are attached to the encounter.  CT CHEST ABDOMEN PELVIS W CONTRAST  Result Date: 12/11/2022 CLINICAL DATA:  Recurrent non-small cell lung cancer on therapy. Status post chemoradiation therapy this July. * Tracking Code: BO * EXAM: CT CHEST, ABDOMEN, AND PELVIS WITH CONTRAST TECHNIQUE: Multidetector CT imaging of the chest, abdomen and pelvis was performed following the standard protocol during bolus administration of intravenous contrast. RADIATION DOSE REDUCTION: This exam was performed according to the departmental dose-optimization program which includes automated exposure control, adjustment of the mA and/or kV according to patient size and/or use of iterative reconstruction technique. CONTRAST:  OMNIPAQUE IOHEXOL 300 MG/ML  SOLN COMPARISON:  09/04/2022 chest CT.  PET 05/30/2022. FINDINGS: CT CHEST FINDINGS Cardiovascular: Right Port-A-Cath tip high right atrium. Aortic atherosclerosis. Normal heart size, without pericardial effusion. Three vessel coronary artery calcification. No central pulmonary embolism, on this non-dedicated  study. Mediastinum/Nodes: No supraclavicular adenopathy. The previous AP window adenopathy has resolved with only small nodes remain in this area on 21/2. No residual high left paratracheal adenopathy. No hilar adenopathy. Again identified is mid to distal mild-to-moderate esophageal wall thickening. Lungs/Pleura: No pleural fluid.  Mild centrilobular emphysema. The anterior left upper lobe nodule of interest has essentially resolved, with only minimal interstitial thickening remaining on 44/4. Again identified is posterior left upper lobe/lingular volume loss and consolidation, presumably radiation induced. No local recurrence. New or increased left perihilar interstitial thickening including on 55/4, likely also radiation induced. Musculoskeletal: No acute osseous abnormality. CT ABDOMEN PELVIS FINDINGS Hepatobiliary: Normal liver. Normal gallbladder, without biliary ductal dilatation. Pancreas: Normal, without mass or ductal dilatation. Spleen: Normal in size, without focal abnormality. Adrenals/Urinary Tract: Normal right adrenal gland and kidneys. Left adrenal 2.8 cm nodule is similar in size to on the prior. This has been previously characterized as a benign adenoma. Normal urinary bladder. Stomach/Bowel: Normal  stomach, without wall thickening. Normal colon, appendix, and terminal ileum. Normal small bowel. Vascular/Lymphatic: Advanced aortic and branch vessel atherosclerosis. No abdominopelvic adenopathy. Reproductive: Mild prostatomegaly with median lobe impression into the bladder. Other: No significant free fluid. No evidence of omental or peritoneal disease. Musculoskeletal: Lumbar spondylosis. Disc bulges at L3-4 and L2-3. Degenerative disc disease at L3-4. IMPRESSION: 1. Chronic radiation induced consolidation within the posterior left upper lobe. New/progressive subacute radiation pneumonitis is mild in the perihilar left lung. 2. No findings of recurrent/progressive disease. 3. The left upper lobe  nodule of interest has essentially resolved. 4. No residual or recurrent thoracic adenopathy. 5. No acute process or evidence of metastatic disease in the abdomen or pelvis. 6. Left adrenal adenoma . In the absence of clinically indicated signs/symptoms require(s) no independent follow-up. 7. Mid and distal esophageal wall thickening again suggests esophagitis. This could be radiation induced or secondary to reflux. 8. Prostatomegaly 9. Coronary artery atherosclerosis. Aortic Atherosclerosis (ICD10-I70.0). Electronically Signed   By: Jeronimo Greaves M.D.   On: 12/11/2022 11:12     Assessment and plan- Patient is a 77 y.o. male with history of stage I non-small cell lung cancer s/p SBRT in 2023 presenting with recurrent biopsy-proven non-small cell lung cancer in the mediastinum.  He is s/p concurrent chemoradiation with weekly CarboTaxol chemotherapy and is here for on treatment assessment prior to cycle 4 of maintenance durvalumab  Counts okay to proceed with cycle 4 of maintenance Durvalumab today.  I will see him back in 4 weeks for cycle 5.  Plan is to complete 1 year of maintenance therapy.  I have reviewed CT chest abdomen and pelvis images independently and discussed findings with the patient which shows no evidence of recurrent or progressive disease.  Left upper lobe nodule has resolved and there is no residual thoracic adenopathy.  He has benign left adrenal adenoma which can be followed up.  He has radiation changes noted in the posterior left upper lobe with mild radiation pneumonitis which we will continue to monitor.   Visit Diagnosis 1. Recurrent non-small cell lung cancer (HCC)   2. Encounter for antineoplastic immunotherapy      Dr. Owens Shark, MD, MPH [facility] at [facility] [phone] 12/22/2022 8:59 AM

## 2023-01-02 ENCOUNTER — Telehealth: Payer: Self-pay | Admitting: Primary Care

## 2023-01-02 NOTE — Telephone Encounter (Signed)
11.14.24 210/80 11.22.24 166/87  140/62 this morning, 11.26.24  Patient states he is feeling good

## 2023-01-02 NOTE — Telephone Encounter (Signed)
Unable to reach patient. Left voicemail to return call to our office.   

## 2023-01-02 NOTE — Telephone Encounter (Signed)
-----   Message from Doreene Nest sent at 12/19/2022 10:56 AM EST ----- Regarding: BP readings Can we call patient to check on BP readings since we increased his lisinopril to 30 mg daily?  How are they running?

## 2023-01-02 NOTE — Telephone Encounter (Signed)
Noted. Will call him back in 2 weeks for additional readings. Lisinopril was increased to 30 mg on 12/19/22.

## 2023-01-04 ENCOUNTER — Other Ambulatory Visit: Payer: Self-pay

## 2023-01-15 ENCOUNTER — Telehealth: Payer: Self-pay | Admitting: Primary Care

## 2023-01-15 ENCOUNTER — Encounter: Payer: Self-pay | Admitting: Radiation Oncology

## 2023-01-15 ENCOUNTER — Ambulatory Visit
Admission: RE | Admit: 2023-01-15 | Discharge: 2023-01-15 | Disposition: A | Discharge status: Home / Self Care | Payer: Medicare Other | Source: Ambulatory Visit | Attending: Radiation Oncology | Admitting: Radiation Oncology

## 2023-01-15 VITALS — BP 151/83 | HR 74 | Temp 97.5°F | Resp 16 | Ht 67.0 in | Wt 182.3 lb

## 2023-01-15 DIAGNOSIS — C3412 Malignant neoplasm of upper lobe, left bronchus or lung: Secondary | ICD-10-CM

## 2023-01-15 DIAGNOSIS — Z87891 Personal history of nicotine dependence: Secondary | ICD-10-CM | POA: Diagnosis not present

## 2023-01-15 NOTE — Telephone Encounter (Signed)
-----   Message from Doreene Nest sent at 01/02/2023  5:21 PM EST ----- Regarding: BP Readings Can we call patient to check on blood pressure readings? Also, is he still taking the increased dose of lisinopril 30 mg daily?

## 2023-01-15 NOTE — Progress Notes (Signed)
Radiation Oncology Follow up Note  Name: Antonio [name]   Date:   01/15/2023 MRN:  [id number] DOB: March 22, 1945    This 77 y.o. male presents to the clinic today for 10-month follow-up status post concurrent chemoradiation therapy for.  Recurrent non-small cell lung cancer left upper lobe with mediastinal adenopathy and patient previously treated with SBRT over a year ago for stage I non-small cell lung cancer of the left upper lobe.  REFERRING PROVIDER: Doreene Nest, NP  HPI: The patient, a 77 year old individual with a history of stage one non-small cell lung cancer of the left upper lobe, has completed IMRT radiation therapy for mediastinal adenopathy. This was after being previously treated with SBRT. The patient is currently on maintenance durvalumab and reports tolerating it well. The patient reports feeling good overall, with occasional coughing but no blood. The patient is also on Trelegy daily. A recent CT scan shows chronic radiation-induced consolidation of the posterior left upper lobe, but no findings of recurrent or progressive disease. The left upper lobe nodule of interest has essentially resolved, with no residual or recurrent thoracic adenopathy..  COMPLICATIONS OF TREATMENT: none  FOLLOW UP COMPLIANCE: keeps appointments   PHYSICAL EXAM:  BP (!) 151/83   Pulse 74   Temp (!) 97.5 F (36.4 C)   Resp 16   Ht 5\' 7"  (1.702 m)   Wt 182 lb 4.8 oz (82.7 kg)   BMI 28.55 kg/m  Well-developed well-nourished patient in NAD. HEENT reveals PERLA, EOMI, discs not visualized.  Oral cavity is clear. No oral mucosal lesions are identified. Neck is clear without evidence of cervical or supraclavicular adenopathy. Lungs are clear to A&P. Cardiac examination is essentially unremarkable with regular rate and rhythm without murmur rub or thrill. Abdomen is benign with no organomegaly or masses noted. Motor sensory and DTR levels are equal and symmetric in the upper and lower extremities.  Cranial nerves II through XII are grossly intact. Proprioception is intact. No peripheral adenopathy or edema is identified. No motor or sensory levels are noted. Crude visual fields are within normal range.  RADIOLOGY RESULTS: RADIOLOGY CT scan: Chronic radiation-induced consolidation of the posterior left upper lobe. No findings of recurrent or progressive disease. Left upper lobe nodule resolved. No residual or recurrent thoracic adenopathy. Films have been reviewed by me  PLAN: Non-small cell lung cancer (NSCLC) Post-IMRT radiation therapy for mediastinal adenopathy and SBRT for stage 1 NSCLC of the left upper lobe. Recent CT scan shows chronic radiation-induced consolidation of the posterior left upper lobe, resolution of the left upper lobe nodule, and no residual or recurrent thoracic adenopathy. No findings of recurrent or progressive disease. -Continue current management with maintenanceDurvalumab. -Return for follow-up in six months.  Chronic cough Occasional cough reported, no hemoptysis. Currently on daily Trelegy. -Continue Trelegy as prescribed.  Diarrhea Reported as a side effect of immunotherapy. -Continue current management as this is an expected side effect.    Carmina Miller, MD

## 2023-01-16 NOTE — Telephone Encounter (Signed)
Called and spoke with patient, verified he is taking increased dose of lisinopril 30mg  daily.  His readings are below:  143/81 166/73 158/77 131/61 102/56

## 2023-01-16 NOTE — Telephone Encounter (Signed)
Noted. Will continue lisinopril at 30 mg daily

## 2023-01-19 ENCOUNTER — Inpatient Hospital Stay: Payer: Medicare Other | Attending: Oncology

## 2023-01-19 ENCOUNTER — Encounter: Payer: Self-pay | Admitting: Oncology

## 2023-01-19 ENCOUNTER — Inpatient Hospital Stay: Payer: Medicare Other

## 2023-01-19 ENCOUNTER — Inpatient Hospital Stay (HOSPITAL_BASED_OUTPATIENT_CLINIC_OR_DEPARTMENT_OTHER): Payer: Medicare Other | Attending: Oncology | Admitting: Oncology

## 2023-01-19 VITALS — BP 149/77 | HR 44 | Temp 97.2°F | Resp 18 | Wt 180.0 lb

## 2023-01-19 DIAGNOSIS — C3490 Malignant neoplasm of unspecified part of unspecified bronchus or lung: Secondary | ICD-10-CM

## 2023-01-19 DIAGNOSIS — Z79899 Other long term (current) drug therapy: Secondary | ICD-10-CM | POA: Insufficient documentation

## 2023-01-19 DIAGNOSIS — C3412 Malignant neoplasm of upper lobe, left bronchus or lung: Secondary | ICD-10-CM | POA: Insufficient documentation

## 2023-01-19 DIAGNOSIS — Z87891 Personal history of nicotine dependence: Secondary | ICD-10-CM | POA: Diagnosis not present

## 2023-01-19 DIAGNOSIS — Z5112 Encounter for antineoplastic immunotherapy: Secondary | ICD-10-CM | POA: Diagnosis not present

## 2023-01-19 LAB — CBC WITH DIFFERENTIAL (CANCER CENTER ONLY)
Abs Immature Granulocytes: 0.06 10*3/uL (ref 0.00–0.07)
Basophils Absolute: 0.1 10*3/uL (ref 0.0–0.1)
Basophils Relative: 1 %
Eosinophils Absolute: 0.2 10*3/uL (ref 0.0–0.5)
Eosinophils Relative: 3 %
HCT: 33.1 % — ABNORMAL LOW (ref 39.0–52.0)
Hemoglobin: 10.7 g/dL — ABNORMAL LOW (ref 13.0–17.0)
Immature Granulocytes: 1 %
Lymphocytes Relative: 11 %
Lymphs Abs: 0.7 10*3/uL (ref 0.7–4.0)
MCH: 28.5 pg (ref 26.0–34.0)
MCHC: 32.3 g/dL (ref 30.0–36.0)
MCV: 88 fL (ref 80.0–100.0)
Monocytes Absolute: 0.6 10*3/uL (ref 0.1–1.0)
Monocytes Relative: 9 %
Neutro Abs: 4.6 10*3/uL (ref 1.7–7.7)
Neutrophils Relative %: 75 %
Platelet Count: 252 10*3/uL (ref 150–400)
RBC: 3.76 MIL/uL — ABNORMAL LOW (ref 4.22–5.81)
RDW: 14.8 % (ref 11.5–15.5)
WBC Count: 6.1 10*3/uL (ref 4.0–10.5)
nRBC: 0 % (ref 0.0–0.2)

## 2023-01-19 LAB — CMP (CANCER CENTER ONLY)
ALT: 10 U/L (ref 0–44)
AST: 14 U/L — ABNORMAL LOW (ref 15–41)
Albumin: 3.5 g/dL (ref 3.5–5.0)
Alkaline Phosphatase: 57 U/L (ref 38–126)
Anion gap: 8 (ref 5–15)
BUN: 19 mg/dL (ref 8–23)
CO2: 21 mmol/L — ABNORMAL LOW (ref 22–32)
Calcium: 8.7 mg/dL — ABNORMAL LOW (ref 8.9–10.3)
Chloride: 111 mmol/L (ref 98–111)
Creatinine: 0.81 mg/dL (ref 0.61–1.24)
GFR, Estimated: >60 mL/min (ref >60)
Glucose, Bld: 230 mg/dL — ABNORMAL HIGH (ref 70–99)
Potassium: 4.2 mmol/L (ref 3.5–5.1)
Sodium: 140 mmol/L (ref 135–145)
Total Bilirubin: 0.5 mg/dL (ref <1.2)
Total Protein: 6.1 g/dL — ABNORMAL LOW (ref 6.5–8.1)

## 2023-01-19 MED ORDER — SODIUM CHLORIDE 0.9 % IV SOLN
1500.0000 mg | Freq: Once | INTRAVENOUS | Status: AC
  Administered 2023-01-19: 1500 mg via INTRAVENOUS
  Filled 2023-01-19: qty 30

## 2023-01-19 MED ORDER — HEPARIN SOD (PORK) LOCK FLUSH 100 UNIT/ML IV SOLN
500.0000 [IU] | Freq: Once | INTRAVENOUS | Status: AC | PRN
  Administered 2023-01-19: 500 [IU]
  Filled 2023-01-19: qty 5

## 2023-01-19 MED ORDER — SODIUM CHLORIDE 0.9 % IV SOLN
Freq: Once | INTRAVENOUS | Status: AC
Start: 2023-01-19 — End: 2023-01-19
  Filled 2023-01-19: qty 250

## 2023-01-19 MED ORDER — SODIUM CHLORIDE 0.9% FLUSH
10.0000 mL | INTRAVENOUS | Status: AC | PRN
  Administered 2023-01-19: 10 mL
  Filled 2023-01-19: qty 10

## 2023-01-19 NOTE — Progress Notes (Signed)
Hematology/Oncology Consult note [facility]  [phone] [phone]  Patient Care Team: Doreene Nest, NP as PCP - General (Internal Medicine) Debbe Odea, MD as PCP - Cardiology (Cardiology) Duke Salvia, MD as PCP - Electrophysiology (Cardiology) Harle Battiest PA-C as Physician Assistant (Urology) Meryl Dare, MD as Consulting Physician (Gastroenterology) Kathyrn Sheriff, [facility] (Inactive) as Pharmacist (Pharmacist) Glory Buff, RN as Oncology Nurse Navigator Creig Hines, MD as Consulting Physician (Oncology)   Name of the patient: Antonio [name]  [id number]  May 23, 1945   Date of visit: 01/19/23  Diagnosis-locally recurrent adenocarcinoma of the lung  Chief complaint/ Reason for visit-on treatment assessment prior to cycle 5 of maintenance durvalumab  Heme/Onc history: patient is a 77 year old male who was diagnosed with stage I non-small cell lung cancer involving the left upper lobe about 1 year ago and he received SBRT for the same. He has been undergoing surveillance CT scans since then. CT chest in April 2024 showed increasing areas of disease with left-sided mediastinal and hilar lymph nodes and soft tissue along the left main bronchus. 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 No residual focal hypermetabolism in the treated area of the left upper lobe. No evidence of distant metastatic disease. He underwent MRI brain without contrast in February 2024 which did not show any evidence of metastatic disease. Patient was seen by Dr. Jayme Cloud and underwent EBUS guided biopsies left paratracheal lymph node was biopsied and consistent with pulmonary adenocarcinoma.  Patient completed concurrent chemoradiation with weekly CarboTaxol chemotherapy in July 2024.  Not enough tissue available for NGS testing.  Circulogene  assay was sent by pulmonary and peripheral blood NGS testing did not show any evidence of EGFR or ALK mutation    Patient started on maintenance durvalumab in August 2024  Interval history-tolerating treatments well so far.  He has occasional diarrhea about 2 times a week which is self-limited.  He has had diarrhea even during chemotherapy and is no better or worse presently  ECOG PS- 2 Pain scale- 0  Review of systems- Review of Systems  Constitutional:  Positive for malaise/fatigue. Negative for chills, fever and weight loss.  HENT:  Negative for congestion, ear discharge and nosebleeds.   Eyes:  Negative for blurred vision.  Respiratory:  Negative for cough, hemoptysis, sputum production, shortness of breath and wheezing.   Cardiovascular:  Negative for chest pain, palpitations, orthopnea and claudication.  Gastrointestinal:  Negative for abdominal pain, blood in stool, constipation, diarrhea, heartburn, melena, nausea and vomiting.  Genitourinary:  Negative for dysuria, flank pain, frequency, hematuria and urgency.  Musculoskeletal:  Negative for back pain, joint pain and myalgias.  Skin:  Negative for rash.  Neurological:  Negative for dizziness, tingling, focal weakness, seizures, weakness and headaches.  Endo/Heme/Allergies:  Does not bruise/bleed easily.  Psychiatric/Behavioral:  Negative for depression and suicidal ideas. The patient does not have insomnia.       No Known Allergies   Past Medical History:  Diagnosis Date   Adenoma of left adrenal gland    Anemia    Aortic atherosclerosis (HCC)    Arthritis    Atrial fibrillation and flutter (HCC)    a.) CHA2DS2VASc = 7 (age x 2, HTN, CVA x 2, vascular disease history, T2DM);  b.) rate/rhythm maintained without pharmacological intervention; chronic antithrombotic therapy using ASA + clopidogrel   Bilateral carotid artery  stenosis    a.) s/p PTA 04/07/2022 --> 90% RICA --> 9x7x30 Exact stent; b.) doppler 03/14/20214: 1-39%  RICA, 40-59% LICA   BPH with urinary obstruction    stable on flomax ([name]   Childhood asthma    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   a.) CT head and MRI brain 04/05/2022: chronic cortical/subcortical RIGHT MCA territory infarct within the RIGHT parietal lobe and RIGHT temporoparietal junction   Coronary atherosclerosis of unspecified type of vessel, native or graft    a.) cCTA 05/22/2021: Ca score 1524 (84th percentile for age/sex match control) --> FFRct: dLAD = 0.77, dLCx = 0.67, RPLA = 0.77, dOM1 = 0.66; b.) LHC 05/23/2021: 20% dRCA, 50% OM1 - med mgmt   COVID-19 virus infection 12/07/2022   Diastolic dysfunction    a.) TTE 04/06/2022: EF 60-65%, mod LVH, mild MR, AoV sclerosis, G1DD   DOE (dyspnea on exertion)    Emphysema of lung (HCC)    Erectile dysfunction    a.) on PDE5i (sildenafil)   Esophageal stricture    Fall    Gastric AVM    Gastritis    GERD (gastroesophageal reflux disease)    Hematoma 05/22/2022   Hiatal hernia    Hx of colonic polyps    Hyperlipidemia    Hypertension    Internal hemorrhoids without mention of complication    Ischemic leg    Long term current use of antithrombotics/antiplatelets    a.) on DAPT (ASA + clopidogrel)   Lumbago    Mobitz (type) I (Wenckebach's) atrioventricular block    Non-small cell lung cancer (HCC)    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   a.) PCR (+) 05/29/2018, 04/07/2022   Pain in limb 12/11/2017   Pneumonia    PSA elevation    now averaging 2's   PVD (peripheral vascular disease) with claudication (HCC)    Sigmoid diverticulosis    Tobacco abuse    Type 2 diabetes mellitus treated with insulin [facility])      Past Surgical History:  Procedure Laterality Date   2D Echo  07/2001   BACK SURGERY     CAROTID PTA/STENT INTERVENTION Right 04/07/2022   Procedure: CAROTID PTA/STENT INTERVENTION;  Surgeon: Renford Dills, MD;  Location: [facility] INVASIVE CV LAB;  Service: Cardiovascular;  Laterality: Right;    COLONOSCOPY     ENDOBRONCHIAL ULTRASOUND Left 06/19/2022   Procedure: ENDOBRONCHIAL ULTRASOUND;  Surgeon: Salena Saner, MD;  Location: [facility] ORS;  Service: Pulmonary;  Laterality: Left;   ESOPHAGOGASTRODUODENOSCOPY  02/13/2007   gastritis and duodenitis without bleed   ESOPHAGOGASTRODUODENOSCOPY N/A 06/26/2014   Procedure: ESOPHAGOGASTRODUODENOSCOPY (EGD);  Surgeon: Rachael Fee, MD;  Location[facility] ENDOSCOPY;  Service: Endoscopy;  Laterality: N/A;   ESOPHAGOGASTRODUODENOSCOPY (EGD) WITH PROPOFOL N/A 04/08/2020   Procedure: ESOPHAGOGASTRODUODENOSCOPY (EGD) WITH PROPOFOL;  Surgeon: Meryl Dare, MD;  Location: [facility] ENDOSCOPY;  Service: Endoscopy;  Laterality: N/A;   FLEXIBLE BRONCHOSCOPY Left 06/19/2022   Procedure: FLEXIBLE BRONCHOSCOPY;  Surgeon: Salena Saner, MD;  Location: [facility] ORS;  Service: Pulmonary;  Laterality: Left;   HOT HEMOSTASIS N/A 04/08/2020   Procedure: HOT HEMOSTASIS (ARGON PLASMA COAGULATION/BICAP);  Surgeon: Meryl Dare, MD;  Location: [facility] ENDOSCOPY;  Service: Endoscopy;  Laterality: N/A;   IR IMAGING GUIDED PORT INSERTION  06/30/2022   KNEE ARTHROSCOPY Right 11/1998   LEFT HEART CATH AND CORONARY ANGIOGRAPHY N/A 05/23/2021   Procedure: LEFT HEART CATH AND CORONARY ANGIOGRAPHY;  Surgeon: Orbie Pyo, MD;  Location: [facility] INVASIVE CV LAB;  Service: Cardiovascular;  Laterality: N/A;   LOWER EXTREMITY ANGIOGRAPHY Right 12/27/2017   Procedure: LOWER EXTREMITY ANGIOGRAPHY;  Surgeon: Annice Needy, MD;  Location: [facility] INVASIVE CV LAB;  Service: Cardiovascular;  Laterality: Right;   LOWER EXTREMITY ANGIOGRAPHY Left 01/10/2018   Procedure: LOWER EXTREMITY ANGIOGRAPHY;  Surgeon: Annice Needy, MD;  Location: [facility] INVASIVE CV LAB;  Service: Cardiovascular;  Laterality: Left;   LOWER EXTREMITY ANGIOGRAPHY Left 05/29/2018   Procedure: LOWER EXTREMITY ANGIOGRAPHY;  Surgeon: Annice Needy, MD;  Location: [facility] INVASIVE CV LAB;  Service: Cardiovascular;  Laterality: Left;    LOWER EXTREMITY ANGIOGRAPHY Left 05/30/2018   Procedure: Lower Extremity Angiography;  Surgeon: Annice Needy, MD;  Location: [facility] INVASIVE CV LAB;  Service: Cardiovascular;  Laterality: Left;   LP  07/2001   Microwave thermotherapy prostate  08/28/2007   [place]   Persantine cardiolite  04/1999   EF 68%   POLYPECTOMY  12/1993   Stress myoview  08/2004   EF 67%   UPPER GASTROINTESTINAL ENDOSCOPY      Social History   Socioeconomic History   Marital status: Widowed    Spouse name: Armando Reichert   Number of children: 3   Years of education: Not on file   Highest education level: Not on file  Occupational History   Occupation: Retired Music therapist  Tobacco Use   Smoking status: Former    Current packs/day: 0.00    Average packs/day: 2.0 packs/day for 57.3 years (114.5 ttl pk-yrs)    Types: Cigarettes    Start date: 02/07/1964    Quit date: 05/11/2021    Years since quitting: 1.6   Smokeless tobacco: Former    Types: Chew   Tobacco comments:    Quit in 2023- khj  Vaping Use   Vaping status: Never Used  Substance and Sexual Activity   Alcohol use: Yes    Comment: rare   Drug use: No   Sexual activity: Yes  Other Topics Concern   Not on file  Social History Narrative   Married 9/09 wife with moderate memory problems.   3 adult children, 2 grandchildren   Would desire CPR   Social Drivers of Health   Financial Resource Strain: Low Risk  (01/24/2022)   Overall Financial Resource Strain (CARDIA)    Difficulty of Paying Living Expenses: Not hard at all  Food Insecurity: No Food Insecurity (06/26/2022)   Hunger Vital Sign    Worried About Running Out of Food in the Last Year: Never true    Ran Out of Food in the Last Year: Never true  Transportation Needs: No Transportation Needs (06/26/2022)   PRAPARE - Administrator, Civil Service (Medical): No    Lack of Transportation (Non-Medical): No  Physical Activity: Insufficiently Active (01/24/2022)   Exercise Vital Sign     Days of Exercise per Week: 3 days    Minutes of Exercise per Session: 30 min  Stress: No Stress Concern Present (01/24/2022)   [employer] of Occupational Health - Occupational Stress Questionnaire    Feeling of Stress : Not at all  Social Connections: Moderately Isolated (01/24/2022)   Social Connection and Isolation Panel [NHANES]    Frequency of Communication with Friends and Family: More than three times a week    Frequency of Social Gatherings with Friends and Family: Once a week    Attends Religious Services: More than 4 times per year    Active Member of [employer] or Organizations: No    Attends [employer]  or Organization Meetings: Never    Marital Status: Widowed  Intimate Partner Violence: Not At Risk (06/26/2022)   Humiliation, Afraid, Rape, and Kick questionnaire    Fear of Current or Ex-Partner: No    Emotionally Abused: No    Physically Abused: No    Sexually Abused: No    Family History  Problem Relation Age of Onset   Rheum arthritis Mother    Diabetes Father    Alcohol abuse Paternal Uncle    Alcohol abuse Paternal Uncle    Alcohol abuse Paternal Uncle    Heart disease Neg Hx    Stroke Neg Hx    Cancer Neg Hx    Drug abuse Neg Hx    Depression Neg Hx    Colon cancer Neg Hx    Rectal cancer Neg Hx    Stomach cancer Neg Hx    Kidney cancer Neg Hx    Bladder Cancer Neg Hx    Prostate cancer Neg Hx      Current Outpatient Medications:    acetaminophen (TYLENOL) 500 MG tablet, Take 1,000 mg by mouth every 6 (six) hours. Rapid release, Disp: , Rfl:    albuterol (VENTOLIN HFA) 108 (90 Base) MCG/ACT inhaler, Inhale 2 puffs into the lungs every 6 (six) hours as needed for wheezing or shortness of breath., Disp: 8 g, Rfl: 2   atorvastatin (LIPITOR) 20 MG tablet, TAKE 1 TABLET BY MOUTH IN THE EVENING FOR CHOLESTEROL, Disp: 90 tablet, Rfl: 0   clopidogrel (PLAVIX) 75 MG tablet, Take 1 tablet (75 mg total) by mouth daily., Disp: 90 tablet, Rfl: 3   ferrous gluconate  (FERGON) 324 MG tablet, TAKE 1 TABLET BY MOUTH EVERY DAY WITH BREAKFAST, Disp: 30 tablet, Rfl: 0   FIASP FLEXTOUCH 100 UNIT/ML FlexTouch Pen, Inject 10 units before lunch meal each day., Disp: , Rfl:    FIBER PO, Take 1 capsule by mouth in the morning., Disp: , Rfl:    finasteride (PROSCAR) 5 MG tablet, Take 1 tablet (5 mg total) by mouth daily. (Patient taking differently: Take 5 mg by mouth every morning.), Disp: 90 tablet, Rfl: 3   Fluticasone-Umeclidin-Vilant (TRELEGY ELLIPTA) 100-62.5-25 MCG/ACT AEPB, Inhale 1 puff into the lungs daily., Disp: 28 each, Rfl: 11   glipiZIDE (GLUCOTROL) 10 MG tablet, Take 10 mg by mouth daily before breakfast., Disp: , Rfl:    GLUCOSAMINE HCL PO, Take 1 tablet by mouth daily., Disp: , Rfl:    insulin aspart (NOVOLOG) 100 UNIT/ML injection, Inject 10 Units into the skin once., Disp: , Rfl:    Insulin Glargine (BASAGLAR KWIKPEN) 100 UNIT/ML, Inject 30 Units into the skin every morning., Disp: , Rfl:    lisinopril (ZESTRIL) 30 MG tablet, Take 1 tablet (30 mg total) by mouth daily. for blood pressure., Disp: 90 tablet, Rfl: 0   metFORMIN (GLUCOPHAGE) 500 MG tablet, TAKE 2 TABLETS BY MOUTH TWICE A DAY WITH MEAL FOR DIABETES (Patient taking differently: Take 1,000 mg by mouth 2 (two) times daily. TAKE 2 TABLETS BY MOUTH TWICE A DAY WITH MEAL FOR DIABETES), Disp: 360 tablet, Rfl: 2   pantoprazole (PROTONIX) 40 MG tablet, TAKE 1 TABLET (40 MG TOTAL) BY MOUTH DAILY. FOR HEARTBURN, Disp: 90 tablet, Rfl: 0   sildenafil (VIAGRA) 100 MG tablet, Take 1 tablet (100 mg total) by mouth daily as needed for erectile dysfunction., Disp: 30 tablet, Rfl: 0   tamsulosin (FLOMAX) 0.4 MG CAPS capsule, Take 1 capsule (0.4 mg total) by mouth 2 (two) times daily.,  Disp: 180 capsule, Rfl: 3   vitamin B-12 (CYANOCOBALAMIN) 1000 MCG tablet, Take 1,000 mcg by mouth in the morning., Disp: , Rfl:  No current facility-administered medications for this visit.  Facility-Administered Medications  Ordered in Other Visits:    sodium chloride flush (NS) 0.9 % injection 10 mL, 10 mL, Intracatheter, PRN, Creig Hines, MD, 10 mL at 01/19/23 1205  Physical exam:  Vitals:   01/19/23 1004  BP: (!) 149/77  Pulse: (!) 44  Resp: 18  Temp: (!) 97.2 F (36.2 C)  TempSrc: Tympanic  SpO2: 100%  Weight: 180 lb (81.6 kg)   Physical Exam Cardiovascular:     Rate and Rhythm: Regular rhythm. Bradycardia present.     Heart sounds: Normal heart sounds.  Pulmonary:     Effort: Pulmonary effort is normal.     Breath sounds: Normal breath sounds.  Abdominal:     General: Bowel sounds are normal.     Palpations: Abdomen is soft.  Skin:    General: Skin is warm and dry.  Neurological:     Mental Status: He is alert and oriented to person, place, and time.         Latest Ref Rng & Units 01/19/2023    9:39 AM  CMP  Glucose 70 - 99 mg/dL 811   BUN 8 - 23 mg/dL 19   Creatinine 9.14 - 1.24 mg/dL 7.82   Sodium 956 - 213 mmol/L 140   Potassium 3.5 - 5.1 mmol/L 4.2   Chloride 98 - 111 mmol/L 111   CO2 22 - 32 mmol/L 21   Calcium 8.9 - 10.3 mg/dL 8.7   Total Protein 6.5 - 8.1 g/dL 6.1   Total Bilirubin <0.8 mg/dL 0.5   Alkaline Phos 38 - 126 U/L 57   AST 15 - 41 U/L 14   ALT 0 - 44 U/L 10       Latest Ref Rng & Units 01/19/2023    9:39 AM  CBC  WBC 4.0 - 10.5 K/uL 6.1   Hemoglobin 13.0 - 17.0 g/dL 65.7   Hematocrit 84.6 - 52.0 % 33.1   Platelets 150 - 400 K/uL 252      Assessment and plan- Patient is a 77 y.o. male with history of stage I non-small cell lung cancer s/p SBRT in 2023 presenting with recurrent biopsy-proven non-small cell lung cancer in the mediastinum. He is s/p concurrent chemoradiation with weekly CarboTaxol chemotherapy.  He is here for on treatment assessment prior to cycle 5 of maintenance durvalumab  Counts okay to proceed with cycle 5 of maintenance durvalumab today.  I will see him back in 4 weeks for cycle 6.  Tolerating treatments well so far.  Hemoglobin  stable between 10-11.  Suspect diarrhea unrelated to immunotherapy but if it gets worse he will let us know.  Has had some of the symptoms even during chemo and he was not receiving immunotherapy.  If symptoms of diarrhea get worse we will arrange for stool studies and consider GI referral to rule out colitis   Visit Diagnosis 1. Recurrent non-small cell lung cancer (HCC)   2. Encounter for antineoplastic immunotherapy      Dr. Owens Shark, MD, MPH [facility] [phone] 01/19/2023 12:57 PM

## 2023-01-19 NOTE — Patient Instructions (Signed)
 [facility] - A DEPT OF [facility]  Discharge Instructions: Thank you for choosing [facility] to provide your oncology and hematology care.  If you have a lab appointment with the Cancer Center, please go directly to the Cancer Center and check in at the registration area.  Wear comfortable clothing and clothing appropriate for easy access to any Portacath or PICC line.   We strive to give you quality time with your provider. You may need to reschedule your appointment if you arrive late (15 or more minutes).  Arriving late affects you and other patients whose appointments are after yours.  Also, if you miss three or more appointments without notifying the office, you may be dismissed from the clinic at the provider's discretion.      For prescription refill requests, have your pharmacy contact our office and allow 72 hours for refills to be completed.    Today you received the following chemotherapy and/or immunotherapy agents Imfinzi      To help prevent nausea and vomiting after your treatment, we encourage you to take your nausea medication as directed.  BELOW ARE SYMPTOMS THAT SHOULD BE REPORTED IMMEDIATELY: *FEVER GREATER THAN 100.4 F (38 C) OR HIGHER *CHILLS OR SWEATING *NAUSEA AND VOMITING THAT IS NOT CONTROLLED WITH YOUR NAUSEA MEDICATION *UNUSUAL SHORTNESS OF BREATH *UNUSUAL BRUISING OR BLEEDING *URINARY PROBLEMS (pain or burning when urinating, or frequent urination) *BOWEL PROBLEMS (unusual diarrhea, constipation, pain near the anus) TENDERNESS IN MOUTH AND THROAT WITH OR WITHOUT PRESENCE OF ULCERS (sore throat, sores in mouth, or a toothache) UNUSUAL RASH, SWELLING OR PAIN  UNUSUAL VAGINAL DISCHARGE OR ITCHING   Items with * indicate a potential emergency and should be followed up as soon as possible or go to the Emergency Department if any problems should occur.  Please show the CHEMOTHERAPY ALERT CARD or IMMUNOTHERAPY  ALERT CARD at check-in to the Emergency Department and triage nurse.  Should you have questions after your visit or need to cancel or reschedule your appointment, please contact [facility] - A DEPT OF [facility]  [phone] and follow the prompts.  Office hours are 8:00 a.m. to 4:30 p.m. Monday - Friday. Please note that voicemails left after 4:00 p.m. may not be returned until the following business day.  We are closed weekends and major holidays. You have access to a nurse at all times for urgent questions. Please call the main number to the clinic [phone] and follow the prompts.  For any non-urgent questions, you may also contact your provider using MyChart. We now offer e-Visits for anyone 67 and older to request care online for non-urgent symptoms. For details visit [email or web address].   Also download the MyChart app! Go to the app store, search "MyChart", open the app, select [place], and log in with your MyChart username and password.

## 2023-01-22 DIAGNOSIS — E1142 Type 2 diabetes mellitus with diabetic polyneuropathy: Secondary | ICD-10-CM | POA: Diagnosis not present

## 2023-01-25 ENCOUNTER — Ambulatory Visit: Payer: Medicare Other

## 2023-01-25 VITALS — Ht 67.0 in | Wt 180.0 lb

## 2023-01-25 DIAGNOSIS — Z794 Long term (current) use of insulin: Secondary | ICD-10-CM | POA: Diagnosis not present

## 2023-01-25 DIAGNOSIS — Z0000 Encounter for general adult medical examination without abnormal findings: Secondary | ICD-10-CM

## 2023-01-25 DIAGNOSIS — E1165 Type 2 diabetes mellitus with hyperglycemia: Secondary | ICD-10-CM

## 2023-01-25 NOTE — Patient Instructions (Signed)
Antonio [name] , Thank you for taking time to come for your Medicare Wellness Visit. I appreciate your ongoing commitment to your health goals. Please review the following plan we discussed and let me know if I can assist you in the future.   Referrals/Orders/Follow-Ups/Clinician Recommendations: none  This is a list of the screening recommended for you and due dates:  Health Maintenance  Topic Date Due   Yearly kidney health urinalysis for diabetes  12/23/2015   Pneumonia Vaccine (2 of 2 - PPSV23 or PCV20) 11/25/2018   COVID-19 Vaccine (3 - Moderna risk series) 09/03/2019   Hemoglobin A1C  10/04/2022   Colon Cancer Screening  12/11/2022   Zoster (Shingles) Vaccine (1 of 2) 03/21/2023*   Flu Shot  05/07/2023*   DTaP/Tdap/Td vaccine (3 - Tdap) 12/19/2023*   Complete foot exam   05/12/2023   Eye exam for diabetics  06/22/2023   Yearly kidney function blood test for diabetes  01/19/2024   Medicare Annual Wellness Visit  01/25/2024   Hepatitis C Screening  Completed   HPV Vaccine  Aged Out  *Topic was postponed. The date shown is not the original due date.    Advanced directives: (Copy Requested) Please bring a copy of your health care power of attorney and living will to the office to be added to your chart at your convenience.  Next Medicare Annual Wellness Visit scheduled for next year: Yes 01/28/2024 @ 1pm televisit

## 2023-01-25 NOTE — Addendum Note (Signed)
Addended by: Lovena Neighbours on: 01/25/2023 03:56 PM   Modules accepted: Orders

## 2023-01-25 NOTE — Progress Notes (Signed)
Subjective:   Antonio [name] is a 77 y.o. male who presents for Medicare Annual/Subsequent preventive examination.  Visit Complete: Virtual I connected with  Antonio [name] on 01/25/23 by a audio enabled telemedicine application and verified that I am speaking with the correct person using two identifiers.  Patient Location: Home  Provider Location: Home Office  I discussed the limitations of evaluation and management by telemedicine. The patient expressed understanding and agreed to proceed.  Vital Signs: Because this visit was a virtual/telehealth visit, some criteria may be missing or patient reported. Any vitals not documented were not able to be obtained and vitals that have been documented are patient reported.  Patient Medicare AWV questionnaire was completed by the patient on (not done); I have confirmed that all information answered by patient is correct and no changes since this date. Cardiac Risk Factors include: advanced age (>31men, >49 women);diabetes mellitus;dyslipidemia;hypertension;male gender;sedentary lifestyle    Objective:    Today's Vitals   01/25/23 1311 01/25/23 1312 01/25/23 1336  Weight: 180 lb (81.6 kg)    Height: 5\' 7"  (1.702 m)    PainSc:  6  6    Body mass index is 28.19 kg/m.     01/19/2023    9:58 AM 12/22/2022    8:59 AM 11/24/2022    9:56 AM 11/24/2022    8:42 AM 09/29/2022    9:06 AM 09/25/2022    1:17 PM 09/13/2022    1:04 PM  Advanced Directives  Does Patient Have a Medical Advance Directive? No No No Yes Yes No No  Type of Aeronautical engineer of [place];Living will Healthcare Power of [place];Living will  Healthcare Power of [place];Living will  Does patient want to make changes to medical advance directive? No - Patient declined No - Patient declined No - Patient declined Yes (ED - Information included in AVS)  No - Patient declined   Would patient like information on creating a medical advance directive? No - Patient  declined No - Patient declined  Yes (ED - Information included in AVS)  No - Patient declined     Current Medications (verified) Outpatient Encounter Medications as of 01/25/2023  Medication Sig   acetaminophen (TYLENOL) 500 MG tablet Take 1,000 mg by mouth every 6 (six) hours. Rapid release   albuterol (VENTOLIN HFA) 108 (90 Base) MCG/ACT inhaler Inhale 2 puffs into the lungs every 6 (six) hours as needed for wheezing or shortness of breath.   atorvastatin (LIPITOR) 20 MG tablet TAKE 1 TABLET BY MOUTH IN THE EVENING FOR CHOLESTEROL   clopidogrel (PLAVIX) 75 MG tablet Take 1 tablet (75 mg total) by mouth daily.   ferrous gluconate (FERGON) 324 MG tablet TAKE 1 TABLET BY MOUTH EVERY DAY WITH BREAKFAST   FIASP FLEXTOUCH 100 UNIT/ML FlexTouch Pen Inject 10 units before lunch meal each day.   FIBER PO Take 1 capsule by mouth in the morning.   finasteride (PROSCAR) 5 MG tablet Take 1 tablet (5 mg total) by mouth daily. (Patient taking differently: Take 5 mg by mouth every morning.)   Fluticasone-Umeclidin-Vilant (TRELEGY ELLIPTA) 100-62.5-25 MCG/ACT AEPB Inhale 1 puff into the lungs daily.   glipiZIDE (GLUCOTROL) 10 MG tablet Take 10 mg by mouth daily before breakfast.   GLUCOSAMINE HCL PO Take 1 tablet by mouth daily.   insulin aspart (NOVOLOG) 100 UNIT/ML injection Inject 10 Units into the skin once.   Insulin Glargine (BASAGLAR KWIKPEN) 100 UNIT/ML Inject 30 Units into the skin every  morning.   lisinopril (ZESTRIL) 30 MG tablet Take 1 tablet (30 mg total) by mouth daily. for blood pressure.   metFORMIN (GLUCOPHAGE) 500 MG tablet TAKE 2 TABLETS BY MOUTH TWICE A DAY WITH MEAL FOR DIABETES (Patient taking differently: Take 1,000 mg by mouth 2 (two) times daily. TAKE 2 TABLETS BY MOUTH TWICE A DAY WITH MEAL FOR DIABETES)   pantoprazole (PROTONIX) 40 MG tablet TAKE 1 TABLET (40 MG TOTAL) BY MOUTH DAILY. FOR HEARTBURN   sildenafil (VIAGRA) 100 MG tablet Take 1 tablet (100 mg total) by mouth daily as  needed for erectile dysfunction.   tamsulosin (FLOMAX) 0.4 MG CAPS capsule Take 1 capsule (0.4 mg total) by mouth 2 (two) times daily.   vitamin B-12 (CYANOCOBALAMIN) 1000 MCG tablet Take 1,000 mcg by mouth in the morning.   Facility-Administered Encounter Medications as of 01/25/2023  Medication   sodium chloride flush (NS) 0.9 % injection 10 mL    Allergies (verified) Patient has no known allergies.   History: Past Medical History:  Diagnosis Date   Adenoma of left adrenal gland    Anemia    Aortic atherosclerosis (HCC)    Arthritis    Atrial fibrillation and flutter (HCC)    a.) CHA2DS2VASc = 7 (age x 2, HTN, CVA x 2, vascular disease history, T2DM);  b.) rate/rhythm maintained without pharmacological intervention; chronic antithrombotic therapy using ASA + clopidogrel   Bilateral carotid artery stenosis    a.) s/p PTA 04/07/2022 --> 90% RICA --> 9x7x30 Exact stent; b.) doppler 03/14/20214: 1-39% RICA, 40-59% LICA   BPH with urinary obstruction    stable on flomax ([name]   Childhood asthma    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   a.) CT head and MRI brain 04/05/2022: chronic cortical/subcortical RIGHT MCA territory infarct within the RIGHT parietal lobe and RIGHT temporoparietal junction   Coronary atherosclerosis of unspecified type of vessel, native or graft    a.) cCTA 05/22/2021: Ca score 1524 (84th percentile for age/sex match control) --> FFRct: dLAD = 0.77, dLCx = 0.67, RPLA = 0.77, dOM1 = 0.66; b.) LHC 05/23/2021: 20% dRCA, 50% OM1 - med mgmt   COVID-19 virus infection 12/07/2022   Diastolic dysfunction    a.) TTE 04/06/2022: EF 60-65%, mod LVH, mild MR, AoV sclerosis, G1DD   DOE (dyspnea on exertion)    Emphysema of lung (HCC)    Erectile dysfunction    a.) on PDE5i (sildenafil)   Esophageal stricture    Fall    Gastric AVM    Gastritis    GERD (gastroesophageal reflux disease)    Hematoma 05/22/2022   Hiatal hernia    Hx of colonic polyps     Hyperlipidemia    Hypertension    Internal hemorrhoids without mention of complication    Ischemic leg    Long term current use of antithrombotics/antiplatelets    a.) on DAPT (ASA + clopidogrel)   Lumbago    Mobitz (type) I (Wenckebach's) atrioventricular block    Non-small cell lung cancer (HCC)    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   a.) PCR (+) 05/29/2018, 04/07/2022   Pain in limb 12/11/2017   Pneumonia    PSA elevation    now averaging 2's   PVD (peripheral vascular disease) with claudication (HCC)    Sigmoid diverticulosis    Tobacco abuse    Type 2 diabetes mellitus treated with insulin [facility])    Past Surgical History:  Procedure Laterality Date   2D Echo  07/2001  BACK SURGERY     CAROTID PTA/STENT INTERVENTION Right 04/07/2022   Procedure: CAROTID PTA/STENT INTERVENTION;  Surgeon: Renford Dills, MD;  Location: [facility] INVASIVE CV LAB;  Service: Cardiovascular;  Laterality: Right;   COLONOSCOPY     ENDOBRONCHIAL ULTRASOUND Left 06/19/2022   Procedure: ENDOBRONCHIAL ULTRASOUND;  Surgeon: Salena Saner, MD;  Location: [facility] ORS;  Service: Pulmonary;  Laterality: Left;   ESOPHAGOGASTRODUODENOSCOPY  02/13/2007   gastritis and duodenitis without bleed   ESOPHAGOGASTRODUODENOSCOPY N/A 06/26/2014   Procedure: ESOPHAGOGASTRODUODENOSCOPY (EGD);  Surgeon: Rachael Fee, MD;  Location: [facility] ENDOSCOPY;  Service: Endoscopy;  Laterality: N/A;   ESOPHAGOGASTRODUODENOSCOPY (EGD) WITH PROPOFOL N/A 04/08/2020   Procedure: ESOPHAGOGASTRODUODENOSCOPY (EGD) WITH PROPOFOL;  Surgeon: Meryl Dare, MD;  Location: [facility] ENDOSCOPY;  Service: Endoscopy;  Laterality: N/A;   FLEXIBLE BRONCHOSCOPY Left 06/19/2022   Procedure: FLEXIBLE BRONCHOSCOPY;  Surgeon: Salena Saner, MD;  Location: [facility] ORS;  Service: Pulmonary;  Laterality: Left;   HOT HEMOSTASIS N/A 04/08/2020   Procedure: HOT HEMOSTASIS (ARGON PLASMA COAGULATION/BICAP);  Surgeon: Meryl Dare, MD;  Location: [facility] ENDOSCOPY;   Service: Endoscopy;  Laterality: N/A;   IR IMAGING GUIDED PORT INSERTION  06/30/2022   KNEE ARTHROSCOPY Right 11/1998   LEFT HEART CATH AND CORONARY ANGIOGRAPHY N/A 05/23/2021   Procedure: LEFT HEART CATH AND CORONARY ANGIOGRAPHY;  Surgeon: Orbie Pyo, MD;  Location: [facility] INVASIVE CV LAB;  Service: Cardiovascular;  Laterality: N/A;   LOWER EXTREMITY ANGIOGRAPHY Right 12/27/2017   Procedure: LOWER EXTREMITY ANGIOGRAPHY;  Surgeon: Annice Needy, MD;  Location: [facility] INV[facility]IVE CV LAB;  Service: Cardiovascular;  Laterality: Right;   LOWER EXTREMITY ANGIOGRAPHY Left 01/10/2018   Procedure: LOWER EXTREMITY ANGIOGRAPHY;  Surgeon: Annice Needy, MD;  Location: [facility] INV[facility]IVE CV [facility];  Service: Cardiovascular;  Laterality: Left;   LOWER EXTREMITY ANGIOGRAPHY Left 05/29/2018   Procedure: LOWER EXTREMITY ANGIOGRAPHY;  Surgeon: Annice Needy, MD;  Location: [facility] INV[facility] CV LAB;  Service: Cardiovascular;  Laterality: Left;   LOWER EXTREMITY ANGIOGRAPHY Left 05/30/2018   Procedure: Lower Extremity Angiography;  Surgeon: Annice Needy, MD;  Location: [facility] INVASIVE CV LAB;  Service: Cardiovascular;  Laterality: Left;   LP  07/2001   Microwave thermotherapy prostate  08/28/2007   [place]   Persantine cardiolite  04/1999   EF 68%   POLYPECTOMY  12/1993   Stress myoview  08/2004   EF 67%   UPPER GASTROINTESTINAL ENDOSCOPY     Family History  Problem Relation Age of Onset   Rheum arthritis Mother    Diabetes Father    Alcohol abuse Paternal Uncle    Alcohol abuse Paternal Uncle    Alcohol abuse Paternal Uncle    Heart disease Neg Hx    Stroke Neg Hx    Cancer Neg Hx    Drug abuse Neg Hx    Depression Neg Hx    Colon cancer Neg Hx    Rectal cancer Neg Hx    Stomach cancer Neg Hx    Kidney cancer Neg Hx    Bladder Cancer Neg Hx    Prostate cancer Neg Hx    Social History   Socioeconomic History   Marital status: Widowed    Spouse name: Armando Reichert   Number of children: 3   Years of education: Not on  file   Highest education level: Not on file  Occupational History   Occupation: Retired Music therapist  Tobacco Use   Smoking status: Former    Current packs/day: 0.00  Average packs/day: 2.0 packs/day for 57.3 years (114.5 ttl pk-yrs)    Types: Cigarettes    Start date: 02/07/1964    Quit date: 05/11/2021    Years since quitting: 1.7   Smokeless tobacco: Former    Types: Chew   Tobacco comments:    Quit in 2023- khj  Vaping Use   Vaping status: Never Used  Substance and Sexual Activity   Alcohol use: Yes    Comment: rare   Drug use: No   Sexual activity: Yes  Other Topics Concern   Not on file  Social History Narrative   Married 9/09 wife with moderate memory problems.   3 adult children, 2 grandchildren   Would desire CPR   Social Drivers of Health   Financial Resource Strain: Low Risk  (01/25/2023)   Overall Financial Resource Strain (CARDIA)    Difficulty of Paying Living Expenses: Not very hard  Food Insecurity: No Food Insecurity (01/25/2023)   Hunger Vital Sign    Worried About Running Out of Food in the Last Year: Never true    Ran Out of Food in the Last Year: Never true  Transportation Needs: No Transportation Needs (01/25/2023)   PRAPARE - Administrator, Civil Service (Medical): No    Lack of Transportation (Non-Medical): No  Physical Activity: Inactive (01/25/2023)   Exercise Vital Sign    Days of Exercise per Week: 0 days    Minutes of Exercise per Session: 0 min  Stress: No Stress Concern Present (01/25/2023)   [employer] of Occupational Health - Occupational Stress Questionnaire    Feeling of Stress : Only a little  Social Connections: Moderately Isolated (01/25/2023)   Social Connection and Isolation Panel [NHANES]    Frequency of Communication with Friends and Family: Twice a week    Frequency of Social Gatherings with Friends and Family: Once a week    Attends Religious Services: More than 4 times per year    Active Member of [employer]  or Organizations: No    Attends Banker Meetings: Never    Marital Status: Widowed    Tobacco Counseling Counseling given: Not Answered Tobacco comments: Quit in 2023- khj   Clinical Intake:  Pre-visit preparation completed: Yes  Pain : 0-10 Pain Score: 6  Pain Type: Chronic pain Pain Location: Back (legs) Pain Descriptors / Indicators: Aching Pain Onset: More than a month ago Pain Frequency: Constant Pain Relieving Factors: tylenol  Pain Relieving Factors: tylenol BMI - recorded: 28.19 Nutritional Status: BMI 25 -29 Overweight Nutritional Risks: None Diabetes: Yes CBG done?: Yes (BS 139 at home) CBG resulted in Enter/ Edit results?: No Did pt. bring in CBG monitor from home?: No  How often do you need to have someone help you when you read instructions, pamphlets, or other written materials from your doctor or pharmacy?: 1 - Never  Interpreter Needed?: No  Comments: lives alone Information entered by :: B.Leocadio Heal,LPN   Activities of Daily Living    01/25/2023    1:25 PM 06/30/2022    1:28 PM  In your present state of health, do you have any difficulty performing the following activities:  Hearing? 1 1  Vision? 0 0  Difficulty concentrating or making decisions? 0 0  Walking or climbing stairs? 1 0  Dressing or bathing? 1 0  Doing errands, shopping? 1   Preparing Food and eating ? N   Using the Toilet? N   In the past six months, have you accidently leaked  urine? N   Do you have problems with loss of bowel control? N   Managing your Medications? N   Managing your Finances? N   Housekeeping or managing your Housekeeping? N     Patient Care Team: Doreene Nest, NP as PCP - General (Internal Medicine) Debbe Odea, MD as PCP - Cardiology (Cardiology) Duke Salvia, MD as PCP - Electrophysiology (Cardiology) Hulan Fray as Physician Assistant (Urology) Meryl Dare, MD as Consulting Physician  (Gastroenterology) Kathyrn Sheriff, [facility] (Inactive) as Pharmacist (Pharmacist) Glory Buff, RN as Oncology Nurse Navigator Creig Hines, MD as Consulting Physician (Oncology) Galen Manila, MD as Referring Physician (Ophthalmology)  Indicate any recent Medical Services you may have received from other than [facility] providers in the past year (date may be approximate).     Assessment:   This is a routine wellness examination for Antonio [name].  Hearing/Vision screen Hearing Screening - Comments:: Pt says his hearing is not as good as use to be but alright Vision Screening - Comments:: Pt says his vision and wears readers only [facility]   Goals Addressed             This Visit's Progress    COMPLETED: DIET - EAT MORE FRUITS AND VEGETABLES   On track    COMPLETED: Increase water intake   On track    Starting 09/14/2017, I will continue to drink at least 6-8 glasses of water daily.      Patient Stated       01/25/2023, no goals     COMPLETED: Patient Stated   On track    Would like to maintain current routine       Depression Screen    01/25/2023    1:20 PM 12/19/2022    7:49 AM 06/26/2022    1:43 PM 05/22/2022    3:34 PM 04/14/2022    9:05 AM 01/24/2022    3:33 PM 05/12/2021   10:29 AM  PHQ 2/9 Scores  PHQ - 2 Score 0 0 0 0 0 0 0  PHQ- 9 Score    0 2 0     Fall Risk    01/25/2023    1:15 PM 12/19/2022    7:49 AM 04/14/2022    9:05 AM 01/24/2022    3:35 PM 05/12/2021   10:29 AM  Fall Risk   Falls in the past year? 1 1 1 1  0  Number falls in past yr: 0 0 1 1   Injury with Fall? 0 0 0 1   Risk for fall due to : Impaired balance/gait;Impaired mobility History of fall(s) History of fall(s) No Fall Risks;History of fall(s);Impaired balance/gait   Follow up Education provided;Falls prevention discussed Falls evaluation completed Falls evaluation completed Falls prevention discussed;Falls evaluation completed Falls evaluation completed    MEDICARE RISK AT  HOME: Medicare Risk at Home Any stairs in or around the home?: No If so, are there any without handrails?: No Home free of loose throw rugs in walkways, pet beds, electrical cords, etc?: Yes Adequate lighting in your home to reduce risk of falls?: Yes Life alert?: Yes Use of a cane, walker or w/c?: Yes (cane) Grab bars in the bathroom?: Yes Shower chair or bench in shower?: Yes Elevated toilet seat or a handicapped toilet?: Yes  TIMED UP AND GO:  Was the test performed?  No    Cognitive Function:    09/23/2018    9:11 AM 09/14/2017    8:11 AM  07/12/2016    9:26 AM  MMSE - Mini Mental State Exam  Orientation to time 5 5 5   Orientation to Place 5 5 5   Registration 3 3 3   Attention/ Calculation 5 0 0  Recall 3 3 3   Language- name 2 objects 0 0 0  Language- repeat 0 1 1  Language- follow 3 step command 0 3 3  Language- read & follow direction 0 0 0  Write a sentence 0 0 0  Copy design 0 0 0  Total score 21 20 20         01/25/2023    1:26 PM 01/24/2022    3:39 PM  6CIT Screen  What Year? 0 points 0 points  What month? 0 points 0 points  What time? 0 points 0 points  Count back from 20 0 points 0 points  Months in reverse 0 points 0 points  Repeat phrase 0 points 0 points  Total Score 0 points 0 points    Immunizations Immunization History  Administered Date(s) Administered   Moderna Sars-Covid-2 Vaccination 07/09/2019, 08/06/2019   Pneumococcal Conjugate-13 09/30/2018   Td 10/08/1994, 11/19/2006    TDAP status: Up to date  Flu Vaccine status: Declined, Education has been provided regarding the importance of this vaccine but patient still declined. Advised may receive this vaccine at local pharmacy or Health Dept. Aware to provide a copy of the vaccination record if obtained from local pharmacy or Health Dept. Verbalized acceptance and understanding.  Pneumococcal vaccine status: Up to date  Covid-19 vaccine status: Completed vaccines  Qualifies for Shingles  Vaccine? Yes   Zostavax completed No   Shingrix Completed?: No.    Education has been provided regarding the importance of this vaccine. Patient has been advised to call insurance company to determine out of pocket expense if they have not yet received this vaccine. Advised may also receive vaccine at local pharmacy or Health Dept. Verbalized acceptance and understanding.  Screening Tests Health Maintenance  Topic Date Due   Diabetic kidney evaluation - Urine ACR  12/23/2015   HEMOGLOBIN A1C  10/04/2022   COVID-19 Vaccine (3 - Moderna risk series) 02/10/2023 (Originally 09/03/2019)   Zoster Vaccines- Shingrix (1 of 2) 03/21/2023 (Originally 12/25/1964)   INFLUENZA VACCINE  05/07/2023 (Originally 09/07/2022)   DTaP/Tdap/Td (3 - Tdap) 12/19/2023 (Originally 11/18/2016)   Pneumonia Vaccine 61+ Years old (2 of 2 - PPSV23 or PCV20) 01/25/2024 (Originally 11/25/2018)   Colonoscopy  01/25/2024 (Originally 12/11/2022)   FOOT EXAM  05/12/2023   OPHTHALMOLOGY EXAM  06/22/2023   Diabetic kidney evaluation - eGFR measurement  01/19/2024   Medicare Annual Wellness (AWV)  01/25/2024   Hepatitis C Screening  Completed   HPV VACCINES  Aged Out    Health Maintenance  Health Maintenance Due  Topic Date Due   Diabetic kidney evaluation - Urine ACR  12/23/2015   HEMOGLOBIN A1C  10/04/2022    Colorectal cancer screening: No longer required.   Lung Cancer Screening: (Low Dose CT Chest recommended if Age 61-80 years, 20 pack-year currently smoking OR have quit w/in 15years.) does not qualify.   Lung Cancer Screening Referral: no  Additional Screening:  Hepatitis C Screening: does not qualify; Completed 07/12/2016  Vision Screening: Recommended annual ophthalmology exams for early detection of glaucoma and other disorders of the eye. Is the patient up to date with their annual eye exam?  Yes  Who is the provider or what is the name of the office in which the patient attends annual  eye exams? Dr  Druscilla Brownie If pt is not established with a provider, would they like to be referred to a provider to establish care? No .   Dental Screening: Recommended annual dental exams for proper oral hygiene  Diabetic Foot Exam: Diabetic Foot Exam: Completed 05/12/22  Community Resource Referral / Chronic Care Management: CRR required this visit?  No   CCM required this visit?  No     Plan:     I have personally reviewed and noted the following in the patient's chart:   Medical and social history Use of alcohol, tobacco or illicit drugs  Current medications and supplements including opioid prescriptions. Patient is not currently taking opioid prescriptions. Functional ability and status Nutritional status Physical activity Advanced directives List of other physicians Hospitalizations, surgeries, and ER visits in previous 12 months Vitals Screenings to include cognitive, depression, and falls Referrals and appointments  In addition, I have reviewed and discussed with patient certain preventive protocols, quality metrics, and best practice recommendations. A written personalized care plan for preventive services as well as general preventive health recommendations were provided to patient.    Sue Lush, LPN   40/11/2723   After Visit Summary: (MyChart) Due to this being a telephonic visit, the after visit summary with patients personalized plan was offered to patient via MyChart   Nurse Notes: The patient states he is  doing well and has no concerns or questions at this time.

## 2023-01-26 ENCOUNTER — Other Ambulatory Visit: Payer: Self-pay

## 2023-01-29 DIAGNOSIS — I10 Essential (primary) hypertension: Secondary | ICD-10-CM | POA: Diagnosis not present

## 2023-01-29 DIAGNOSIS — C3490 Malignant neoplasm of unspecified part of unspecified bronchus or lung: Secondary | ICD-10-CM | POA: Diagnosis not present

## 2023-01-29 DIAGNOSIS — R809 Proteinuria, unspecified: Secondary | ICD-10-CM | POA: Diagnosis not present

## 2023-01-29 DIAGNOSIS — E1129 Type 2 diabetes mellitus with other diabetic kidney complication: Secondary | ICD-10-CM | POA: Diagnosis not present

## 2023-01-29 DIAGNOSIS — E1142 Type 2 diabetes mellitus with diabetic polyneuropathy: Secondary | ICD-10-CM | POA: Diagnosis not present

## 2023-01-29 DIAGNOSIS — E1165 Type 2 diabetes mellitus with hyperglycemia: Secondary | ICD-10-CM | POA: Diagnosis not present

## 2023-01-29 DIAGNOSIS — Z794 Long term (current) use of insulin: Secondary | ICD-10-CM | POA: Diagnosis not present

## 2023-01-29 DIAGNOSIS — E1159 Type 2 diabetes mellitus with other circulatory complications: Secondary | ICD-10-CM | POA: Diagnosis not present

## 2023-01-31 ENCOUNTER — Other Ambulatory Visit: Payer: Self-pay | Admitting: Primary Care

## 2023-01-31 DIAGNOSIS — K2100 Gastro-esophageal reflux disease with esophagitis, without bleeding: Secondary | ICD-10-CM

## 2023-02-11 ENCOUNTER — Other Ambulatory Visit: Payer: Self-pay | Admitting: Primary Care

## 2023-02-11 DIAGNOSIS — E782 Mixed hyperlipidemia: Secondary | ICD-10-CM

## 2023-02-16 ENCOUNTER — Inpatient Hospital Stay: Payer: Medicare Other

## 2023-02-16 ENCOUNTER — Inpatient Hospital Stay: Payer: Medicare Other | Attending: Oncology

## 2023-02-16 ENCOUNTER — Inpatient Hospital Stay (HOSPITAL_BASED_OUTPATIENT_CLINIC_OR_DEPARTMENT_OTHER): Payer: Medicare Other | Admitting: Oncology

## 2023-02-16 ENCOUNTER — Encounter: Payer: Self-pay | Admitting: Oncology

## 2023-02-16 VITALS — BP 155/65 | HR 55 | Temp 96.1°F | Resp 18 | Wt 177.5 lb

## 2023-02-16 DIAGNOSIS — Z5112 Encounter for antineoplastic immunotherapy: Secondary | ICD-10-CM

## 2023-02-16 DIAGNOSIS — Z79899 Other long term (current) drug therapy: Secondary | ICD-10-CM | POA: Insufficient documentation

## 2023-02-16 DIAGNOSIS — C3490 Malignant neoplasm of unspecified part of unspecified bronchus or lung: Secondary | ICD-10-CM

## 2023-02-16 DIAGNOSIS — Z87891 Personal history of nicotine dependence: Secondary | ICD-10-CM | POA: Diagnosis not present

## 2023-02-16 DIAGNOSIS — C3412 Malignant neoplasm of upper lobe, left bronchus or lung: Secondary | ICD-10-CM | POA: Diagnosis not present

## 2023-02-16 DIAGNOSIS — Z79620 Long term (current) use of immunosuppressive biologic: Secondary | ICD-10-CM | POA: Diagnosis not present

## 2023-02-16 LAB — CMP (CANCER CENTER ONLY)
ALT: 11 U/L (ref 0–44)
AST: 16 U/L (ref 15–41)
Albumin: 3.6 g/dL (ref 3.5–5.0)
Alkaline Phosphatase: 55 U/L (ref 38–126)
Anion gap: 8 (ref 5–15)
BUN: 22 mg/dL (ref 8–23)
CO2: 22 mmol/L (ref 22–32)
Calcium: 8.9 mg/dL (ref 8.9–10.3)
Chloride: 111 mmol/L (ref 98–111)
Creatinine: 0.85 mg/dL (ref 0.61–1.24)
GFR, Estimated: >60 mL/min (ref >60)
Glucose, Bld: 169 mg/dL — ABNORMAL HIGH (ref 70–99)
Potassium: 4.5 mmol/L (ref 3.5–5.1)
Sodium: 141 mmol/L (ref 135–145)
Total Bilirubin: 0.5 mg/dL (ref 0.0–1.2)
Total Protein: 6.5 g/dL (ref 6.5–8.1)

## 2023-02-16 LAB — CBC WITH DIFFERENTIAL (CANCER CENTER ONLY)
Abs Immature Granulocytes: 0.04 10*3/uL (ref 0.00–0.07)
Basophils Absolute: 0.1 10*3/uL (ref 0.0–0.1)
Basophils Relative: 1 %
Eosinophils Absolute: 0.3 10*3/uL (ref 0.0–0.5)
Eosinophils Relative: 5 %
HCT: 34 % — ABNORMAL LOW (ref 39.0–52.0)
Hemoglobin: 10.8 g/dL — ABNORMAL LOW (ref 13.0–17.0)
Immature Granulocytes: 1 %
Lymphocytes Relative: 11 %
Lymphs Abs: 0.7 10*3/uL (ref 0.7–4.0)
MCH: 27.9 pg (ref 26.0–34.0)
MCHC: 31.8 g/dL (ref 30.0–36.0)
MCV: 87.9 fL (ref 80.0–100.0)
Monocytes Absolute: 0.5 10*3/uL (ref 0.1–1.0)
Monocytes Relative: 8 %
Neutro Abs: 4.7 10*3/uL (ref 1.7–7.7)
Neutrophils Relative %: 74 %
Platelet Count: 246 10*3/uL (ref 150–400)
RBC: 3.87 MIL/uL — ABNORMAL LOW (ref 4.22–5.81)
RDW: 16 % — ABNORMAL HIGH (ref 11.5–15.5)
WBC Count: 6.3 10*3/uL (ref 4.0–10.5)
nRBC: 0 % (ref 0.0–0.2)

## 2023-02-16 LAB — TSH: TSH: 0.355 u[IU]/mL (ref 0.350–4.500)

## 2023-02-16 MED ORDER — HEPARIN SOD (PORK) LOCK FLUSH 100 UNIT/ML IV SOLN
500.0000 [IU] | Freq: Once | INTRAVENOUS | Status: AC | PRN
Start: 2023-02-16 — End: 2023-02-16
  Administered 2023-02-16: 500 [IU]
  Filled 2023-02-16: qty 5

## 2023-02-16 MED ORDER — SODIUM CHLORIDE 0.9% FLUSH
10.0000 mL | Freq: Once | INTRAVENOUS | Status: AC
  Administered 2023-02-16: 10 mL via INTRAVENOUS
  Filled 2023-02-16: qty 10

## 2023-02-16 MED ORDER — DURVALUMAB 500 MG/10ML IV SOLN
1500.0000 mg | Freq: Once | INTRAVENOUS | Status: AC
  Administered 2023-02-16: 1500 mg via INTRAVENOUS
  Filled 2023-02-16: qty 30

## 2023-02-16 MED ORDER — SODIUM CHLORIDE 0.9 % IV SOLN
Freq: Once | INTRAVENOUS | Status: AC
  Filled 2023-02-16: qty 250

## 2023-02-16 NOTE — Patient Instructions (Signed)
 [facility] - A DEPT OF [facility]  Discharge Instructions: Thank you for choosing [facility] to provide your oncology and hematology care.  If you have a lab appointment with the Cancer Center, please go directly to the Cancer Center and check in at the registration area.  Wear comfortable clothing and clothing appropriate for easy access to any Portacath or PICC line.   We strive to give you quality time with your provider. You may need to reschedule your appointment if you arrive late (15 or more minutes).  Arriving late affects you and other patients whose appointments are after yours.  Also, if you miss three or more appointments without notifying the office, you may be dismissed from the clinic at the provider's discretion.      For prescription refill requests, have your pharmacy contact our office and allow 72 hours for refills to be completed.    Today you received the following chemotherapy and/or immunotherapy agents IMFINZI      To help prevent nausea and vomiting after your treatment, we encourage you to take your nausea medication as directed.  BELOW ARE SYMPTOMS THAT SHOULD BE REPORTED IMMEDIATELY: *FEVER GREATER THAN 100.4 F (38 C) OR HIGHER *CHILLS OR SWEATING *NAUSEA AND VOMITING THAT IS NOT CONTROLLED WITH YOUR NAUSEA MEDICATION *UNUSUAL SHORTNESS OF BREATH *UNUSUAL BRUISING OR BLEEDING *URINARY PROBLEMS (pain or burning when urinating, or frequent urination) *BOWEL PROBLEMS (unusual diarrhea, constipation, pain near the anus) TENDERNESS IN MOUTH AND THROAT WITH OR WITHOUT PRESENCE OF ULCERS (sore throat, sores in mouth, or a toothache) UNUSUAL RASH, SWELLING OR PAIN  UNUSUAL VAGINAL DISCHARGE OR ITCHING   Items with * indicate a potential emergency and should be followed up as soon as possible or go to the Emergency Department if any problems should occur.  Please show the CHEMOTHERAPY ALERT CARD or IMMUNOTHERAPY  ALERT CARD at check-in to the Emergency Department and triage nurse.  Should you have questions after your visit or need to cancel or reschedule your appointment, please contact [facility] - A DEPT OF [name]  [phone] and follow the prompts.  Office hours are 8:00 a.m. to 4:30 p.m. Monday - Friday. Please note that voicemails left after 4:00 p.m. may not be returned until the following business day.  We are closed weekends and major holidays. You have access to a nurse at all times for urgent questions. Please call the main number to the clinic [phone] and follow the prompts.  For any non-urgent questions, you may also contact your provider using MyChart. We now offer e-Visits for anyone 30 and older to request care online for non-urgent symptoms. For details visit [email or web address].   Also download the MyChart app! Go to the app store, search "MyChart", open the app, select [facility], and log in with your MyChart username and password.  Durvalumab Injection What is this medication? DURVALUMAB (dur VAL ue mab) treats some types of cancer. It works by helping your immune system slow or stop the spread of cancer cells. It is a monoclonal antibody. This medicine may be used for other purposes; ask your health care provider or pharmacist if you have questions. COMMON BRAND NAME(S): IMFINZI What should I tell my care team before I take this medication? They need to know if you have any of these conditions: Allogeneic stem cell transplant (uses someone else's stem cells) Autoimmune diseases, such as Crohn disease, ulcerative  colitis, lupus History of chest radiation Nervous system problems, such as Guillain-Barre syndrome, myasthenia gravis Organ transplant An unusual or allergic reaction to durvalumab, other medications, foods, dyes, or preservatives Pregnant or trying to get pregnant Breast-feeding How should I use this medication? This  medication is infused into a vein. It is given by your care team in a hospital or clinic setting. A special MedGuide will be given to you before each treatment. Be sure to read this information carefully each time. Talk to your care team about the use of this medication in children. Special care may be needed. Overdosage: If you think you have taken too much of this medicine contact a poison control center or emergency room at once. NOTE: This medicine is only for you. Do not share this medicine with others. What if I miss a dose? Keep appointments for follow-up doses. It is important not to miss your dose. Call your care team if you are unable to keep an appointment. What may interact with this medication? Interactions have not been studied. This list may not describe all possible interactions. Give your health care provider a list of all the medicines, herbs, non-prescription drugs, or dietary supplements you use. Also tell them if you smoke, drink alcohol, or use illegal drugs. Some items may interact with your medicine. What should I watch for while using this medication? Your condition will be monitored carefully while you are receiving this medication. You may need blood work while taking this medication. This medication may cause serious skin reactions. They can happen weeks to months after starting the medication. Contact your care team right away if you notice fevers or flu-like symptoms with a rash. The rash may be red or purple and then turn into blisters or peeling of the skin. You may also notice a red rash with swelling of the face, lips, or lymph nodes in your neck or under your arms. Tell your care team right away if you have any change in your eyesight. Talk to your care team if you may be pregnant. Serious birth defects can occur if you take this medication during pregnancy and for 3 months after the last dose. You will need a negative pregnancy test before starting this medication.  Contraception is recommended while taking this medication and for 3 months after the last dose. Your care team can help you find the option that works for you. Do not breastfeed while taking this medication and for 3 months after the last dose. What side effects may I notice from receiving this medication? Side effects that you should report to your care team as soon as possible: Allergic reactions--skin rash, itching, hives, swelling of the face, lips, tongue, or throat Dry cough, shortness of breath or trouble breathing Eye pain, redness, irritation, or discharge with blurry or decreased vision Heart muscle inflammation--unusual weakness or fatigue, shortness of breath, chest pain, fast or irregular heartbeat, dizziness, swelling of the ankles, feet, or hands Hormone gland problems--headache, sensitivity to light, unusual weakness or fatigue, dizziness, fast or irregular heartbeat, increased sensitivity to cold or heat, excessive sweating, constipation, hair loss, increased thirst or amount of urine, tremors or shaking, irritability Infusion reactions--chest pain, shortness of breath or trouble breathing, feeling faint or lightheaded Kidney injury (glomerulonephritis)--decrease in the amount of urine, red or dark brown urine, foamy or bubbly urine, swelling of the ankles, hands, or feet Liver injury--right upper belly pain, loss of appetite, nausea, light-colored stool, dark yellow or brown urine, yellowing skin or eyes,  unusual weakness or fatigue Pain, tingling, or numbness in the hands or feet, muscle weakness, change in vision, confusion or trouble speaking, loss of balance or coordination, trouble walking, seizures Rash, fever, and swollen lymph nodes Redness, blistering, peeling, or loosening of the skin, including inside the mouth Sudden or severe stomach pain, bloody diarrhea, fever, nausea, vomiting Side effects that usually do not require medical attention (report these to your care team  if they continue or are bothersome): Bone, joint, or muscle pain Diarrhea Fatigue Loss of appetite Nausea Skin rash This list may not describe all possible side effects. Call your doctor for medical advice about side effects. You may report side effects to FDA at 1-800-FDA-1088. Where should I keep my medication? This medication is given in a hospital or clinic. It will not be stored at home. NOTE: This sheet is a summary. It may not cover all possible information. If you have questions about this medicine, talk to your doctor, pharmacist, or health care provider.  2024 [employer]/Gold Standard (2021-06-07 00:00:00)

## 2023-02-16 NOTE — Progress Notes (Signed)
 Hematology/Oncology Consult note [facility]  [phone] [phone]  Patient Care Team: Gretta Comer POUR, NP as PCP - General (Internal Medicine) Darliss Rogue, MD as PCP - Cardiology (Cardiology) Fernande Elspeth BROCKS, MD as PCP - Electrophysiology (Cardiology) Helon Clotilda LABOR, PA-C as Physician Assistant (Urology) Aneita Gwendlyn DASEN, MD (Inactive) as Consulting Physician (Gastroenterology) Fate Morna SAILOR, [facility] (Inactive) as Pharmacist (Pharmacist) Verdene Gills, RN as Oncology Nurse Navigator Melanee Annah BROCKS, MD as Consulting Physician (Oncology) Jaye Fallow, MD as Referring Physician (Ophthalmology)   Name of the patient: Antonio [name]  [id number]  1945-04-17   Date of visit: 02/16/23  Diagnosis-locally recurrent adenocarcinoma of the lung  Chief complaint/ Reason for visit-on mood assessment prior to cycle 6 of maintenance durvalumab   Heme/Onc history: patient is a 78 year old male who was diagnosed with stage I non-small cell lung cancer involving the left upper lobe about 1 year ago and he received SBRT for the same. He has been undergoing surveillance CT scans since then. CT chest in April 2024 showed increasing areas of disease with left-sided mediastinal and hilar lymph nodes and soft tissue along the left main bronchus. 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 No residual focal hypermetabolism in the treated area of the left upper lobe. No evidence of distant metastatic disease. He underwent MRI brain without contrast in February 2024 which did not show any evidence of metastatic disease. Patient was seen by Dr. Tamea and underwent EBUS guided biopsies left paratracheal lymph node was biopsied and consistent with pulmonary adenocarcinoma.  Patient completed concurrent chemoradiation with weekly CarboTaxol chemotherapy in  July 2024.  Not enough tissue available for NGS testing.  Circulogene assay was sent by pulmonary and peripheral blood NGS testing did not show any evidence of EGFR or ALK mutation    Patient started on maintenance durvalumab  in August 2024    Interval history-tolerating durvalumab  well without any significant side effects.  He has had occasional diarrhea a couple of times a week which has been chronic and overall self-limited.  Denies other complaints at this time.  ECOG PS- 1 Pain scale- 0   Review of systems- Review of Systems  Constitutional:  Negative for chills, fever, malaise/fatigue and weight loss.  HENT:  Negative for congestion, ear discharge and nosebleeds.   Eyes:  Negative for blurred vision.  Respiratory:  Negative for cough, hemoptysis, sputum production, shortness of breath and wheezing.   Cardiovascular:  Negative for chest pain, palpitations, orthopnea and claudication.  Gastrointestinal:  Negative for abdominal pain, blood in stool, constipation, diarrhea, heartburn, melena, nausea and vomiting.  Genitourinary:  Negative for dysuria, flank pain, frequency, hematuria and urgency.  Musculoskeletal:  Negative for back pain, joint pain and myalgias.  Skin:  Negative for rash.  Neurological:  Negative for dizziness, tingling, focal weakness, seizures, weakness and headaches.  Endo/Heme/Allergies:  Does not bruise/bleed easily.  Psychiatric/Behavioral:  Negative for depression and suicidal ideas. The patient does not have insomnia.       No Known Allergies   Past Medical History:  Diagnosis Date   Adenoma of left adrenal gland    Anemia    Aortic atherosclerosis (HCC)    Arthritis    Atrial fibrillation and flutter (HCC)    a.) CHA2DS2VASc = 7 (age x 2, HTN, CVA x 2, vascular disease history, T2DM);  b.) rate/rhythm maintained without pharmacological intervention; chronic antithrombotic  therapy using ASA + clopidogrel    Bilateral carotid artery stenosis    a.) s/p  PTA 04/07/2022 --> 90% RICA --> 9x7x30 Exact stent; b.) doppler 03/14/20214: 1-39% RICA, 40-59% LICA   BPH with urinary obstruction    stable on flomax  ([name]   Childhood asthma    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   a.) CT head and MRI brain 04/05/2022: chronic cortical/subcortical RIGHT MCA territory infarct within the RIGHT parietal lobe and RIGHT temporoparietal junction   Coronary atherosclerosis of unspecified type of vessel, native or graft    a.) cCTA 05/22/2021: Ca score 1524 (84th percentile for age/sex match control) --> FFRct: dLAD = 0.77, dLCx = 0.67, RPLA = 0.77, dOM1 = 0.66; b.) LHC 05/23/2021: 20% dRCA, 50% OM1 - med mgmt   COVID-19 virus infection 12/07/2022   Diastolic dysfunction    a.) TTE 04/06/2022: EF 60-65%, mod LVH, mild MR, AoV sclerosis, G1DD   DOE (dyspnea on exertion)    Emphysema of lung (HCC)    Erectile dysfunction    a.) on PDE5i (sildenafil )   Esophageal stricture    Fall    Gastric AVM    Gastritis    GERD (gastroesophageal reflux disease)    Hematoma 05/22/2022   Hiatal hernia    Hx of colonic polyps    Hyperlipidemia    Hypertension    Internal hemorrhoids without mention of complication    Ischemic leg    Long term current use of antithrombotics/antiplatelets    a.) on DAPT (ASA + clopidogrel )   Lumbago    Mobitz (type) I (Wenckebach's) atrioventricular block    Non-small cell lung cancer (HCC)    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   a.) PCR (+) 05/29/2018, 04/07/2022   Pain in limb 12/11/2017   Pneumonia    PSA elevation    now averaging 2's   PVD (peripheral vascular disease) with claudication (HCC)    Sigmoid diverticulosis    Tobacco abuse    Type 2 diabetes mellitus treated with insulin  [facility])      Past Surgical History:  Procedure Laterality Date   2D Echo  07/2001   BACK SURGERY     CAROTID PTA/STENT INTERVENTION Right 04/07/2022   Procedure: CAROTID PTA/STENT INTERVENTION;  Surgeon: Jama Cordella MATSU, MD;   Location: [facility] INVASIVE CV LAB;  Service: Cardiovascular;  Laterality: Right;   COLONOSCOPY     ENDOBRONCHIAL ULTRASOUND Left 06/19/2022   Procedure: ENDOBRONCHIAL ULTRASOUND;  Surgeon: Tamea Dedra CROME, MD;  Location: [facility] ORS;  Service: Pulmonary;  Laterality: Left;   ESOPHAGOGASTRODUODENOSCOPY  02/13/2007   gastritis and duodenitis without bleed   ESOPHAGOGASTRODUODENOSCOPY N/A 06/26/2014   Procedure: ESOPHAGOGASTRODUODENOSCOPY (EGD);  Surgeon: Toribio SHAUNNA Cedar, MD;  Location: [facility] ENDOSCOPY;  Service: Endoscopy;  Laterality: N/A;   ESOPHAGOGASTRODUODENOSCOPY (EGD) WITH PROPOFOL  N/A 04/08/2020   Procedure: ESOPHAGOGASTRODUODENOSCOPY (EGD) WITH PROPOFOL ;  Surgeon: Aneita Gwendlyn DASEN, MD;  Location: [facility] ENDOSCOPY;  Service: Endoscopy;  Laterality: N/A;   FLEXIBLE BRONCHOSCOPY Left 06/19/2022   Procedure: FLEXIBLE BRONCHOSCOPY;  Surgeon: Tamea Dedra CROME, MD;  Location: [facility] ORS;  Service: Pulmonary;  Laterality: Left;   HOT HEMOSTASIS N/A 04/08/2020   Procedure: HOT HEMOSTASIS (ARGON PLASMA COAGULATION/BICAP);  Surgeon: Aneita Gwendlyn DASEN, MD;  Location: [facility];  Service: Endoscopy;  Laterality: N/A;   IR IMAGING GUIDED PORT INSERTION  06/30/2022   KNEE ARTHROSCOPY Right 11/1998   LEFT HEART CATH AND CORONARY ANGIOGRAPHY N/A 05/23/2021   Procedure: LEFT HEART CATH AND CORONARY ANGIOGRAPHY;  Surgeon: Wendel,  Lurena POUR, MD;  Location: [facility] CV LAB;  Service: Cardiovascular;  Laterality: N/A;   LOWER EXTREMITY ANGIOGRAPHY Right 12/27/2017   Procedure: LOWER EXTREMITY ANGIOGRAPHY;  Surgeon: Marea Selinda RAMAN, MD;  Location: [facility] INVAS[facility] CV LAB;  Service: Cardiovascular;  Laterality: Right;   LOWER EXTREMITY ANGIOGRAPHY Left 01/10/2018   Procedure: LOWER EXTREMITY ANGIOGRAPHY;  Surgeon: Marea Selinda RAMAN, MD;  Location: [facility];  Service: Cardiovascular;  Laterality: Left;   LOWER EXTREMITY ANGIOGRAPHY Left 05/29/2018   Procedure: LOWER EXTREMITY ANGIOGRAPHY;  Surgeon: Marea Selinda RAMAN, MD;   Location: [facility] CV [facility];  Service: Cardiovascular;  Laterality: Left;   LOWER EXTREMITY ANGIOGRAPHY Left 05/30/2018   Procedure: Lower Extremity Angiography;  Surgeon: Marea Selinda RAMAN, MD;  Location: [facility] INVAS[facility] CV [facility];  Service: Cardiovascular;  Laterality: Left;   LP  07/2001   Microwave thermotherapy prostate  08/28/2007   Wolfe   Persantine cardiolite  04/1999   EF 68%   POLYPECTOMY  12/1993   Stress myoview   08/2004   EF 67%   UPPER GASTROINTESTINAL ENDOSCOPY      Social History   Socioeconomic History   Marital status: Widowed    Spouse name: Marshall   Number of children: 3   Years of education: Not on file   Highest education level: Not on file  Occupational History   Occupation: Retired music therapist  Tobacco Use   Smoking status: Former    Current packs/day: 0.00    Average packs/day: 2.0 packs/day for 57.3 years (114.5 ttl pk-yrs)    Types: Cigarettes    Start date: 02/07/1964    Quit date: 05/11/2021    Years since quitting: 1.7   Smokeless tobacco: Former    Types: Chew   Tobacco comments:    Quit in 2023- khj  Vaping Use   Vaping status: Never Used  Substance and Sexual Activity   Alcohol use: Yes    Comment: rare   Drug use: No   Sexual activity: Yes  Other Topics Concern   Not on file  Social History Narrative   Married 9/09 wife with moderate memory problems.   3 adult children, 2 grandchildren   Would desire CPR   Social Drivers of Health   Financial Resource Strain: Low Risk  (01/25/2023)   Overall Financial Resource Strain (CARDIA)    Difficulty of Paying Living Expenses: Not very hard  Food Insecurity: No Food Insecurity (01/25/2023)   Hunger Vital Sign    Worried About Running Out of Food in the Last Year: Never true    Ran Out of Food in the Last Year: Never true  Transportation Needs: No Transportation Needs (01/25/2023)   PRAPARE - Administrator, Civil Service (Medical): No    Lack of Transportation (Non-Medical): No   Physical Activity: Inactive (01/25/2023)   Exercise Vital Sign    Days of Exercise per Week: 0 days    Minutes of Exercise per Session: 0 min  Stress: No Stress Concern Present (01/25/2023)   [employer] of Occupational Health - Occupational Stress Questionnaire    Feeling of Stress : Only a little  Social Connections: Moderately Isolated (01/25/2023)   Social Connection and Isolation Panel [NHANES]    Frequency of Communication with Friends and Family: Twice a week    Frequency of Social Gatherings with Friends and Family: Once a week    Attends Religious Services: More than 4 times per year    Active Member of Clubs or Organizations: No  Attends Banker Meetings: Never    Marital Status: Widowed  Intimate Partner Violence: Not At Risk (01/25/2023)   Humiliation, Afraid, Rape, and Kick questionnaire    Fear of Current or Ex-Partner: No    Emotionally Abused: No    Physically Abused: No    Sexually Abused: No    Family History  Problem Relation Age of Onset   Rheum arthritis Mother    Diabetes Father    Alcohol abuse Paternal Uncle    Alcohol abuse Paternal Uncle    Alcohol abuse Paternal Uncle    Heart disease Neg Hx    Stroke Neg Hx    Cancer Neg Hx    Drug abuse Neg Hx    Depression Neg Hx    Colon cancer Neg Hx    Rectal cancer Neg Hx    Stomach cancer Neg Hx    Kidney cancer Neg Hx    Bladder Cancer Neg Hx    Prostate cancer Neg Hx      Current Outpatient Medications:    acetaminophen  (TYLENOL ) 500 MG tablet, Take 1,000 mg by mouth every 6 (six) hours. Rapid release, Disp: , Rfl:    albuterol  (VENTOLIN  HFA) 108 (90 Base) MCG/ACT inhaler, Inhale 2 puffs into the lungs every 6 (six) hours as needed for wheezing or shortness of breath., Disp: 8 g, Rfl: 2   atorvastatin  (LIPITOR) 20 MG tablet, TAKE 1 TABLET BY MOUTH IN THE EVENING FOR CHOLESTEROL, Disp: 90 tablet, Rfl: 2   clopidogrel  (PLAVIX ) 75 MG tablet, Take 1 tablet (75 mg total) by mouth  daily., Disp: 90 tablet, Rfl: 3   ferrous gluconate  (FERGON) 324 MG tablet, TAKE 1 TABLET BY MOUTH EVERY DAY WITH BREAKFAST, Disp: 30 tablet, Rfl: 0   FIASP  FLEXTOUCH 100 UNIT/ML FlexTouch Pen, Inject 10 units before lunch meal each day., Disp: , Rfl:    FIBER PO, Take 1 capsule by mouth in the morning., Disp: , Rfl:    finasteride  (PROSCAR ) 5 MG tablet, Take 1 tablet (5 mg total) by mouth daily. (Patient taking differently: Take 5 mg by mouth every morning.), Disp: 90 tablet, Rfl: 3   Fluticasone -Umeclidin-Vilant (TRELEGY ELLIPTA ) 100-62.5-25 MCG/ACT AEPB, Inhale 1 puff into the lungs daily., Disp: 28 each, Rfl: 11   glipiZIDE  (GLUCOTROL ) 10 MG tablet, Take 10 mg by mouth daily before breakfast., Disp: , Rfl:    GLUCOSAMINE HCL PO, Take 1 tablet by mouth daily., Disp: , Rfl:    insulin  aspart (NOVOLOG ) 100 UNIT/ML injection, Inject 10 Units into the skin once., Disp: , Rfl:    Insulin  Glargine (BASAGLAR KWIKPEN) 100 UNIT/ML, Inject 30 Units into the skin every morning., Disp: , Rfl:    lisinopril  (ZESTRIL ) 30 MG tablet, Take 1 tablet (30 mg total) by mouth daily. for blood pressure., Disp: 90 tablet, Rfl: 0   metFORMIN  (GLUCOPHAGE ) 500 MG tablet, TAKE 2 TABLETS BY MOUTH TWICE A DAY WITH MEAL FOR DIABETES (Patient taking differently: Take 1,000 mg by mouth 2 (two) times daily. TAKE 2 TABLETS BY MOUTH TWICE A DAY WITH MEAL FOR DIABETES), Disp: 360 tablet, Rfl: 2   pantoprazole  (PROTONIX ) 40 MG tablet, TAKE 1 TABLET (40 MG TOTAL) BY MOUTH DAILY. FOR HEARTBURN, Disp: 90 tablet, Rfl: 2   sildenafil  (VIAGRA ) 100 MG tablet, Take 1 tablet (100 mg total) by mouth daily as needed for erectile dysfunction., Disp: 30 tablet, Rfl: 0   tamsulosin  (FLOMAX ) 0.4 MG CAPS capsule, Take 1 capsule (0.4 mg total) by mouth 2 (two)  times daily., Disp: 180 capsule, Rfl: 3   vitamin B-12 (CYANOCOBALAMIN) 1000 MCG tablet, Take 1,000 mcg by mouth in the morning., Disp: , Rfl:  No current facility-administered medications for  this visit.  Facility-Administered Medications Ordered in Other Visits:    heparin  lock flush 100 unit/mL, 500 Units, Intracatheter, Once PRN, Adalbert Alberto C, MD   sodium chloride  flush (NS) 0.9 % injection 10 mL, 10 mL, Intracatheter, PRN, Melanee Annah BROCKS, MD, 10 mL at 01/19/23 1205  Physical exam:  Vitals:   02/16/23 0932  BP: (!) 155/65  Pulse: (!) 55  Resp: 18  Temp: (!) 96.1 F (35.6 C)  TempSrc: Tympanic  SpO2: 98%  Weight: 177 lb 8 oz (80.5 kg)   Physical Exam Constitutional:      Comments: Ambulates with a cane.  Appears in no acute distress  Cardiovascular:     Rate and Rhythm: Normal rate and regular rhythm.     Heart sounds: Normal heart sounds.  Pulmonary:     Effort: Pulmonary effort is normal.     Breath sounds: Normal breath sounds.  Abdominal:     General: Bowel sounds are normal.     Palpations: Abdomen is soft.  Skin:    General: Skin is warm and dry.  Neurological:     Mental Status: He is alert and oriented to person, place, and time.        Latest Ref Rng & Units 02/16/2023    8:57 AM  CMP  Glucose 70 - 99 mg/dL 830   BUN 8 - 23 mg/dL 22   Creatinine 9.38 - 1.24 mg/dL 9.14   Sodium 864 - 854 mmol/L 141   Potassium 3.5 - 5.1 mmol/L 4.5   Chloride 98 - 111 mmol/L 111   CO2 22 - 32 mmol/L 22   Calcium  8.9 - 10.3 mg/dL 8.9   Total Protein 6.5 - 8.1 g/dL 6.5   Total Bilirubin 0.0 - 1.2 mg/dL 0.5   Alkaline Phos 38 - 126 U/L 55   AST 15 - 41 U/L 16   ALT 0 - 44 U/L 11       Latest Ref Rng & Units 02/16/2023    8:57 AM  CBC  WBC 4.0 - 10.5 K/uL 6.3   Hemoglobin 13.0 - 17.0 g/dL 89.1   Hematocrit 60.9 - 52.0 % 34.0   Platelets 150 - 400 K/uL 246      Assessment and plan- Patient is a 78 y.o. male with history of stage I non-small cell lung cancer s/p SBRT in 2023 presenting with recurrent biopsy-proven non-small cell lung cancer in the mediastinum. He is s/p concurrent chemoradiation with weekly CarboTaxol chemotherapy.  He is here for on  treatment assessment prior to cycle 6 of maintenance durvalumab   Counts okay to proceed with cycle 6 of maintenance durvalumab  today.  I will see him back in 4 weeks for cycle 7 with scans CT chest abdomen pelvis with contrast prior.  Overall tolerating treatment well.  He has had occasional diarrhea which has overall been self-limited.  Continue to monitor   Visit Diagnosis 1. Recurrent non-small cell lung cancer (HCC)   2. Encounter for antineoplastic immunotherapy      Dr. Annah Melanee, MD, MPH [facility] [phone] 02/16/2023 12:09 PM

## 2023-02-17 LAB — T4: T4, Total: 10.2 ug/dL (ref 4.5–12.0)

## 2023-02-19 ENCOUNTER — Encounter: Payer: Self-pay | Admitting: Podiatry

## 2023-02-19 ENCOUNTER — Ambulatory Visit (INDEPENDENT_AMBULATORY_CARE_PROVIDER_SITE_OTHER): Payer: Medicare Other | Admitting: Podiatry

## 2023-02-19 VITALS — Ht 67.0 in | Wt 177.5 lb

## 2023-02-19 DIAGNOSIS — B351 Tinea unguium: Secondary | ICD-10-CM | POA: Diagnosis not present

## 2023-02-19 DIAGNOSIS — M79674 Pain in right toe(s): Secondary | ICD-10-CM

## 2023-02-19 DIAGNOSIS — M79675 Pain in left toe(s): Secondary | ICD-10-CM

## 2023-02-19 DIAGNOSIS — E1151 Type 2 diabetes mellitus with diabetic peripheral angiopathy without gangrene: Secondary | ICD-10-CM | POA: Diagnosis not present

## 2023-02-26 NOTE — Progress Notes (Signed)
  Subjective:  Patient ID: Antonio [name], male    DOB: 1946/02/01,  MRN: [id number]  78 y.o. male presents with at risk foot care. Pt has h/o NIDDM with PAD and painful, discolored, thick toenails which interfere with daily activities. Chief Complaint  Patient presents with   Nail Problem    Pt is here for [facility] last A1C was 8 PCP is Dr Chestine Spore and LOV was in December.   [name]    PCP: Doreene Nest, NP.  New problem(s): None.   Review of Systems: Negative except as noted in the HPI.   No Known Allergies  Objective:  There were no vitals filed for this visit. Constitutional Patient is a pleasant 78 y.o.  male WD, WN in NAD. AAO x 3.  Vascular Capillary fill time to digits <3 seconds.  DP/PT pulse(s) are nonpalpable b/l lower extremities. Pedal hair absent b/l. Lower extremity skin temperature gradient warm to cool b/l. No pain with calf compression b/l. No cyanosis or clubbing noted. No ischemia nor gangrene noted b/l.   Neurologic Protective sensation diminished with 10g monofilament b/l.  Dermatologic Pedal skin is thin, shiny and atrophic b/l.  No open wounds b/l lower extremities. No interdigital macerations b/l lower extremities. Toenails 1-5 b/l elongated, discolored, dystrophic, thickened, crumbly with subungual debris and tenderness to dorsal palpation. No hyperkeratotic nor porokeratotic lesions present on today's visit.  Orthopedic: Normal muscle strength 5/5 to all lower extremity muscle groups bilaterally. No gross bony deformities bilaterally.   Last HgA1c:     Latest Ref Rng & Units 04/05/2022    3:04 PM  Hemoglobin A1C  Hemoglobin-A1c 4.8 - 5.6 % 8.8    Assessment:   1. Pain due to onychomycosis of toenails of both feet   2. Type II diabetes mellitus with peripheral circulatory disorder [facility])    Plan:  Patient was evaluated and treated. All patient's and/or POA's questions/concerns addressed on today's visit. Toenails 1-5 debrided in length and girth without incident.  Continue soft, supportive shoe gear daily. Report any pedal injuries to medical professional. Call office if there are any questions/concerns. -Continue foot and shoe inspections daily. Monitor blood glucose per PCP/Endocrinologist's recommendations. -Patient/POA to call should there be question/concern in the interim.  Return in about 10 weeks (around 04/30/2023).  Freddie Breech, DPM      [place] LOCATION: [address], [place] [address]                   Office [phone]   [facility] LOCATION: [address] [place], [place] [address] Office [phone] Freddie Breech, DPM

## 2023-03-09 ENCOUNTER — Ambulatory Visit
Admission: RE | Admit: 2023-03-09 | Discharge: 2023-03-09 | Disposition: A | Discharge status: Home / Self Care | Payer: Medicare Other | Source: Ambulatory Visit | Attending: Oncology

## 2023-03-09 DIAGNOSIS — I700 Atherosclerosis of aorta: Secondary | ICD-10-CM | POA: Diagnosis not present

## 2023-03-09 DIAGNOSIS — C3491 Malignant neoplasm of unspecified part of right bronchus or lung: Secondary | ICD-10-CM | POA: Diagnosis not present

## 2023-03-09 DIAGNOSIS — C3490 Malignant neoplasm of unspecified part of unspecified bronchus or lung: Secondary | ICD-10-CM | POA: Diagnosis not present

## 2023-03-09 DIAGNOSIS — C679 Malignant neoplasm of bladder, unspecified: Secondary | ICD-10-CM | POA: Diagnosis not present

## 2023-03-09 MED ORDER — IOHEXOL 300 MG/ML  SOLN
100.0000 mL | Freq: Once | INTRAMUSCULAR | Status: AC | PRN
  Administered 2023-03-09: 100 mL via INTRAVENOUS

## 2023-03-10 ENCOUNTER — Other Ambulatory Visit: Payer: Self-pay | Admitting: Primary Care

## 2023-03-10 DIAGNOSIS — I10 Essential (primary) hypertension: Secondary | ICD-10-CM

## 2023-03-16 ENCOUNTER — Encounter: Payer: Self-pay | Admitting: Oncology

## 2023-03-16 ENCOUNTER — Inpatient Hospital Stay (HOSPITAL_BASED_OUTPATIENT_CLINIC_OR_DEPARTMENT_OTHER): Payer: Medicare Other | Attending: Oncology | Admitting: Oncology

## 2023-03-16 ENCOUNTER — Inpatient Hospital Stay: Payer: Medicare Other

## 2023-03-16 ENCOUNTER — Inpatient Hospital Stay: Payer: Medicare Other | Attending: Oncology

## 2023-03-16 VITALS — BP 160/63 | HR 63 | Temp 96.5°F | Resp 18 | Wt 180.0 lb

## 2023-03-16 DIAGNOSIS — C3490 Malignant neoplasm of unspecified part of unspecified bronchus or lung: Secondary | ICD-10-CM | POA: Diagnosis not present

## 2023-03-16 DIAGNOSIS — C3412 Malignant neoplasm of upper lobe, left bronchus or lung: Secondary | ICD-10-CM | POA: Insufficient documentation

## 2023-03-16 DIAGNOSIS — Z87891 Personal history of nicotine dependence: Secondary | ICD-10-CM | POA: Diagnosis not present

## 2023-03-16 DIAGNOSIS — Z79899 Other long term (current) drug therapy: Secondary | ICD-10-CM | POA: Insufficient documentation

## 2023-03-16 DIAGNOSIS — Z5112 Encounter for antineoplastic immunotherapy: Secondary | ICD-10-CM | POA: Insufficient documentation

## 2023-03-16 DIAGNOSIS — C3491 Malignant neoplasm of unspecified part of right bronchus or lung: Secondary | ICD-10-CM | POA: Diagnosis not present

## 2023-03-16 LAB — CBC WITH DIFFERENTIAL (CANCER CENTER ONLY)
Abs Immature Granulocytes: 0.04 10*3/uL (ref 0.00–0.07)
Basophils Absolute: 0.1 10*3/uL (ref 0.0–0.1)
Basophils Relative: 1 %
Eosinophils Absolute: 0.3 10*3/uL (ref 0.0–0.5)
Eosinophils Relative: 5 %
HCT: 32.1 % — ABNORMAL LOW (ref 39.0–52.0)
Hemoglobin: 10.5 g/dL — ABNORMAL LOW (ref 13.0–17.0)
Immature Granulocytes: 1 %
Lymphocytes Relative: 14 %
Lymphs Abs: 0.9 10*3/uL (ref 0.7–4.0)
MCH: 28.7 pg (ref 26.0–34.0)
MCHC: 32.7 g/dL (ref 30.0–36.0)
MCV: 87.7 fL (ref 80.0–100.0)
Monocytes Absolute: 0.6 10*3/uL (ref 0.1–1.0)
Monocytes Relative: 10 %
Neutro Abs: 4.6 10*3/uL (ref 1.7–7.7)
Neutrophils Relative %: 69 %
Platelet Count: 240 10*3/uL (ref 150–400)
RBC: 3.66 MIL/uL — ABNORMAL LOW (ref 4.22–5.81)
RDW: 15.8 % — ABNORMAL HIGH (ref 11.5–15.5)
WBC Count: 6.5 10*3/uL (ref 4.0–10.5)
nRBC: 0 % (ref 0.0–0.2)

## 2023-03-16 LAB — CMP (CANCER CENTER ONLY)
ALT: 10 U/L (ref 0–44)
AST: 15 U/L (ref 15–41)
Albumin: 3.4 g/dL — ABNORMAL LOW (ref 3.5–5.0)
Alkaline Phosphatase: 56 U/L (ref 38–126)
Anion gap: 7 (ref 5–15)
BUN: 16 mg/dL (ref 8–23)
CO2: 21 mmol/L — ABNORMAL LOW (ref 22–32)
Calcium: 8.5 mg/dL — ABNORMAL LOW (ref 8.9–10.3)
Chloride: 109 mmol/L (ref 98–111)
Creatinine: 0.82 mg/dL (ref 0.61–1.24)
GFR, Estimated: >60 mL/min (ref >60)
Glucose, Bld: 112 mg/dL — ABNORMAL HIGH (ref 70–99)
Potassium: 3.9 mmol/L (ref 3.5–5.1)
Sodium: 137 mmol/L (ref 135–145)
Total Bilirubin: 0.4 mg/dL (ref 0.0–1.2)
Total Protein: 6.1 g/dL — ABNORMAL LOW (ref 6.5–8.1)

## 2023-03-16 MED ORDER — SODIUM CHLORIDE 0.9 % IV SOLN
Freq: Once | INTRAVENOUS | Status: AC
  Filled 2023-03-16: qty 250

## 2023-03-16 MED ORDER — SODIUM CHLORIDE 0.9 % IV SOLN
1500.0000 mg | Freq: Once | INTRAVENOUS | Status: AC
  Administered 2023-03-16: 1500 mg via INTRAVENOUS
  Filled 2023-03-16: qty 30

## 2023-03-16 MED ORDER — HEPARIN SOD (PORK) LOCK FLUSH 100 UNIT/ML IV SOLN
500.0000 [IU] | Freq: Once | INTRAVENOUS | Status: AC | PRN
  Administered 2023-03-16: 500 [IU]
  Filled 2023-03-16: qty 5

## 2023-03-16 MED ORDER — SODIUM CHLORIDE 0.9% FLUSH
3.0000 mL | INTRAVENOUS | Status: DC | PRN
  Administered 2023-03-16: 10 mL
  Filled 2023-03-16: qty 3

## 2023-03-16 NOTE — Progress Notes (Signed)
 Hematology/Oncology Consult note [facility]  Telephone:([phone] [phone]  Patient Care Team: Gretta Comer POUR, NP as PCP - General (Internal Medicine) Darliss Rogue, MD as PCP - Cardiology (Cardiology) Fernande Elspeth BROCKS, MD as PCP - Electrophysiology (Cardiology) Helon Clotilda LABOR, PA-C as Physician Assistant (Urology) Aneita Gwendlyn DASEN, MD (Inactive) as Consulting Physician (Gastroenterology) Fate Morna SAILOR, [facility] (Inactive) as Pharmacist (Pharmacist) Verdene Gills, RN as Oncology Nurse Navigator Melanee Annah BROCKS, MD as Consulting Physician (Oncology) Jaye Fallow, MD as Referring Physician (Ophthalmology)   Name of the patient: Antonio [name]  [id number]  [birth year]   Date of visit: 03/16/23  Diagnosis- locally recurrent adenocarcinoma of the lung   Chief complaint/ Reason for visit-on treatment assessment prior to cycle 7 of maintenance durvalumab   Heme/Onc history:  patient is a 78 year old male who was diagnosed with stage I non-small cell lung cancer involving the left upper lobe about 1 year ago and he received SBRT for the same. He has been undergoing surveillance CT scans since then. CT chest in April 2024 showed increasing areas of disease with left-sided mediastinal and hilar lymph nodes and soft tissue along the left main bronchus. 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 No residual focal hypermetabolism in the treated area of the left upper lobe. No evidence of distant metastatic disease. He underwent MRI brain without contrast in February 2024 which did not show any evidence of metastatic disease. Patient was seen by Dr. Tamea and underwent EBUS guided biopsies left paratracheal lymph node was biopsied and consistent with pulmonary adenocarcinoma.  Patient completed concurrent chemoradiation with weekly CarboTaxol  chemotherapy in July 2024.  Not enough tissue available for NGS testing.  Circulogene assay was sent by pulmonary and peripheral blood NGS testing did not show any evidence of EGFR or ALK mutation    Patient started on maintenance durvalumab  in August 2024    Interval history-he is doing well overall.  He has intermittent self-limited diarrhea which she has had for many months now and is no worse after starting immunotherapy.  Occasional exertional shortness of breath for which she uses Trelegy.  He has not used his albuterol  inhaler in a long time ECOG PS- 1 Pain scale- 0   Review of systems- Review of Systems  Constitutional:  Negative for chills, fever and weight loss.  HENT:  Negative for congestion, ear discharge and nosebleeds.   Eyes:  Negative for blurred vision.  Respiratory:  Negative for cough, hemoptysis, sputum production, shortness of breath and wheezing.   Cardiovascular:  Negative for chest pain, palpitations, orthopnea and claudication.  Gastrointestinal:  Positive for diarrhea. Negative for abdominal pain, blood in stool, constipation, heartburn, melena and vomiting.  Genitourinary:  Negative for dysuria, flank pain, frequency, hematuria and urgency.  Musculoskeletal:  Negative for back pain, joint pain and myalgias.  Skin:  Negative for rash.  Neurological:  Negative for dizziness, tingling, focal weakness, seizures, weakness and headaches.  Endo/Heme/Allergies:  Does not bruise/bleed easily.  Psychiatric/Behavioral:  Negative for depression and suicidal ideas. The patient does not have insomnia.       No Known Allergies   Past Medical History:  Diagnosis Date   Adenoma of left adrenal gland    Anemia    Aortic atherosclerosis (HCC)    Arthritis    Atrial fibrillation and flutter (HCC)    a.) CHA2DS2VASc = 7 (age x 2,  HTN, CVA x 2, vascular disease history, T2DM);  b.) rate/rhythm maintained without pharmacological intervention; chronic antithrombotic therapy  using ASA + clopidogrel    Bilateral carotid artery stenosis    a.) s/p PTA 04/07/2022 --> 90% RICA --> 9x7x30 Exact stent; b.) doppler 03/14/20214: 1-39% RICA, 40-59% LICA   BPH with urinary obstruction    stable on flomax  ([name]   Childhood asthma    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   a.) CT head and MRI brain 04/05/2022: chronic cortical/subcortical RIGHT MCA territory infarct within the RIGHT parietal lobe and RIGHT temporoparietal junction   Coronary atherosclerosis of unspecified type of vessel, native or graft    a.) cCTA 05/22/2021: Ca score 1524 (84th percentile for age/sex match control) --> FFRct: dLAD = 0.77, dLCx = 0.67, RPLA = 0.77, dOM1 = 0.66; b.) LHC 05/23/2021: 20% dRCA, 50% OM1 - med mgmt   COVID-19 virus infection 12/07/2022   Diastolic dysfunction    a.) TTE 04/06/2022: EF 60-65%, mod LVH, mild MR, AoV sclerosis, G1DD   DOE (dyspnea on exertion)    Emphysema of lung (HCC)    Erectile dysfunction    a.) on PDE5i (sildenafil )   Esophageal stricture    Fall    Gastric AVM    Gastritis    GERD (gastroesophageal reflux disease)    Hematoma 05/22/2022   Hiatal hernia    Hx of colonic polyps    Hyperlipidemia    Hypertension    Internal hemorrhoids without mention of complication    Ischemic leg    Long term current use of antithrombotics/antiplatelets    a.) on DAPT (ASA + clopidogrel )   Lumbago    Mobitz (type) I (Wenckebach's) atrioventricular block    Non-small cell lung cancer (HCC)    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   a.) PCR (+) 05/29/2018, 04/07/2022   Pain in limb 12/11/2017   Pneumonia    PSA elevation    now averaging 2's   PVD (peripheral vascular disease) with claudication (HCC)    Sigmoid diverticulosis    Tobacco abuse    Type 2 diabetes mellitus treated with insulin  [facility])      Past Surgical History:  Procedure Laterality Date   2D Echo  07/2001   BACK SURGERY     CAROTID PTA/STENT INTERVENTION Right 04/07/2022    Procedure: CAROTID PTA/STENT INTERVENTION;  Surgeon: Jama Cordella MATSU, MD;  Location: [facility] INVASIVE CV LAB;  Service: Cardiovascular;  Laterality: Right;   COLONOSCOPY     ENDOBRONCHIAL ULTRASOUND Left 06/19/2022   Procedure: ENDOBRONCHIAL ULTRASOUND;  Surgeon: Tamea Dedra CROME, MD;  Location: [facility] ORS;  Service: Pulmonary;  Laterality: Left;   ESOPHAGOGASTRODUODENOSCOPY  02/13/2007   gastritis and duodenitis without bleed   ESOPHAGOGASTRODUODENOSCOPY N/A 06/26/2014   Procedure: ESOPHAGOGASTRODUODENOSCOPY (EGD);  Surgeon: Toribio SHAUNNA Cedar, MD;  Location[facility] ENDOSCOPY;  Service: Endoscopy;  Laterality: N/A;   ESOPHAGOGASTRODUODENOSCOPY (EGD) WITH PROPOFOL  N/A 04/08/2020   Procedure: ESOPHAGOGASTRODUODENOSCOPY (EGD) WITH PROPOFOL ;  Surgeon: Aneita Gwendlyn DASEN, MD;  Location: [facility] ENDOSCOPY;  Service: Endoscopy;  Laterality: N/A;   FLEXIBLE BRONCHOSCOPY Left 06/19/2022   Procedure: FLEXIBLE BRONCHOSCOPY;  Surgeon: Tamea Dedra CROME, MD;  Location: [facility] ORS;  Service: Pulmonary;  Laterality: Left;   HOT HEMOSTASIS N/A 04/08/2020   Procedure: HOT HEMOSTASIS (ARGON PLASMA COAGULATION/BICAP);  Surgeon: Aneita Gwendlyn DASEN, MD;  Location: [facility];  Service: Endoscopy;  Laterality: N/A;   IR IMAGING GUIDED PORT INSERTION  06/30/2022   KNEE ARTHROSCOPY Right 11/1998   LEFT HEART CATH  AND CORONARY ANGIOGRAPHY N/A 05/23/2021   Procedure: LEFT HEART CATH AND CORONARY ANGIOGRAPHY;  Surgeon: Wendel Lurena POUR, MD;  Location: [facility] INVASIVE CV LAB;  Service: Cardiovascular;  Laterality: N/A;   LOWER EXTREMITY ANGIOGRAPHY Right 12/27/2017   Procedure: LOWER EXTREMITY ANGIOGRAPHY;  Surgeon: Marea Selinda RAMAN, MD;  Location: [facility] INVASIVE CV LAB;  Service: Cardiovascular;  Laterality: Right;   LOWER EXTREMITY ANGIOGRAPHY Left 01/10/2018   Procedure: LOWER EXTREMITY ANGIOGRAPHY;  Surgeon: Marea Selinda RAMAN, MD;  Location: [facility] INVASIVE CV LAB;  Service: Cardiovascular;  Laterality: Left;   LOWER EXTREMITY ANGIOGRAPHY Left  05/29/2018   Procedure: LOWER EXTREMITY ANGIOGRAPHY;  Surgeon: Marea Selinda RAMAN, MD;  Location: [facility] INVASIVE CV LAB;  Service: Cardiovascular;  Laterality: Left;   LOWER EXTREMITY ANGIOGRAPHY Left 05/30/2018   Procedure: Lower Extremity Angiography;  Surgeon: Marea Selinda RAMAN, MD;  Location: [facility] INVASIVE CV LAB;  Service: Cardiovascular;  Laterality: Left;   LP  07/2001   Microwave thermotherapy prostate  08/28/2007   [place]   Persantine cardiolite  04/1999   EF 68%   POLYPECTOMY  12/1993   Stress myoview   08/2004   EF 67%   UPPER GASTROINTESTINAL ENDOSCOPY      Social History   Socioeconomic History   Marital status: Widowed    Spouse name: Marshall   Number of children: 3   Years of education: Not on file   Highest education level: Not on file  Occupational History   Occupation: Retired music therapist  Tobacco Use   Smoking status: Former    Current packs/day: 0.00    Average packs/day: 2.0 packs/day for 57.3 years (114.5 ttl pk-yrs)    Types: Cigarettes    Start date: 02/07/1964    Quit date: 05/11/2021    Years since quitting: 1.8   Smokeless tobacco: Former    Types: Chew   Tobacco comments:    Quit in 2023- khj  Vaping Use   Vaping status: Never Used  Substance and Sexual Activity   Alcohol use: Yes    Comment: rare   Drug use: No   Sexual activity: Yes  Other Topics Concern   Not on file  Social History Narrative   Married 9/09 wife with moderate memory problems.   3 adult children, 2 grandchildren   Would desire CPR   Social Drivers of Health   Financial Resource Strain: Low Risk  (01/25/2023)   Overall Financial Resource Strain (CARDIA)    Difficulty of Paying Living Expenses: Not very hard  Food Insecurity: No Food Insecurity (01/25/2023)   Hunger Vital Sign    Worried About Running Out of Food in the Last Year: Never true    Ran Out of Food in the Last Year: Never true  Transportation Needs: No Transportation Needs (01/25/2023)   PRAPARE - Doctor, General Practice (Medical): No    Lack of Transportation (Non-Medical): No  Physical Activity: Inactive (01/25/2023)   Exercise Vital Sign    Days of Exercise per Week: 0 days    Minutes of Exercise per Session: 0 min  Stress: No Stress Concern Present (01/25/2023)   [employer] of Occupational Health - Occupational Stress Questionnaire    Feeling of Stress : Only a little  Social Connections: Moderately Isolated (01/25/2023)   Social Connection and Isolation Panel [NHANES]    Frequency of Communication with Friends and Family: Twice a week    Frequency of Social Gatherings with Friends and Family: Once a week    Attends Religious  Services: More than 4 times per year    Active Member of Clubs or Organizations: No    Attends Banker Meetings: Never    Marital Status: Widowed  Intimate Partner Violence: Not At Risk (01/25/2023)   Humiliation, Afraid, Rape, and Kick questionnaire    Fear of Current or Ex-Partner: No    Emotionally Abused: No    Physically Abused: No    Sexually Abused: No    Family History  Problem Relation Age of Onset   Rheum arthritis Mother    Diabetes Father    Alcohol abuse Paternal Uncle    Alcohol abuse Paternal Uncle    Alcohol abuse Paternal Uncle    Heart disease Neg Hx    Stroke Neg Hx    Cancer Neg Hx    Drug abuse Neg Hx    Depression Neg Hx    Colon cancer Neg Hx    Rectal cancer Neg Hx    Stomach cancer Neg Hx    Kidney cancer Neg Hx    Bladder Cancer Neg Hx    Prostate cancer Neg Hx      Current Outpatient Medications:    acetaminophen  (TYLENOL ) 500 MG tablet, Take 1,000 mg by mouth every 6 (six) hours. Rapid release, Disp: , Rfl:    albuterol  (VENTOLIN  HFA) 108 (90 Base) MCG/ACT inhaler, Inhale 2 puffs into the lungs every 6 (six) hours as needed for wheezing or shortness of breath., Disp: 8 g, Rfl: 2   atorvastatin  (LIPITOR) 20 MG tablet, TAKE 1 TABLET BY MOUTH IN THE EVENING FOR CHOLESTEROL, Disp: 90 tablet, Rfl:  2   clopidogrel  (PLAVIX ) 75 MG tablet, Take 1 tablet (75 mg total) by mouth daily., Disp: 90 tablet, Rfl: 3   ferrous gluconate  (FERGON) 324 MG tablet, TAKE 1 TABLET BY MOUTH EVERY DAY WITH BREAKFAST, Disp: 30 tablet, Rfl: 0   FIASP  FLEXTOUCH 100 UNIT/ML FlexTouch Pen, Inject 10 units before lunch meal each day., Disp: , Rfl:    FIBER PO, Take 1 capsule by mouth in the morning., Disp: , Rfl:    finasteride  (PROSCAR ) 5 MG tablet, Take 1 tablet (5 mg total) by mouth daily. (Patient taking differently: Take 5 mg by mouth every morning.), Disp: 90 tablet, Rfl: 3   Fluticasone -Umeclidin-Vilant (TRELEGY ELLIPTA ) 100-62.5-25 MCG/ACT AEPB, Inhale 1 puff into the lungs daily., Disp: 28 each, Rfl: 11   glipiZIDE  (GLUCOTROL ) 10 MG tablet, Take 10 mg by mouth daily before breakfast., Disp: , Rfl:    GLUCOSAMINE HCL PO, Take 1 tablet by mouth daily., Disp: , Rfl:    insulin  aspart (NOVOLOG ) 100 UNIT/ML injection, Inject 10 Units into the skin once., Disp: , Rfl:    Insulin  Glargine (BASAGLAR KWIKPEN) 100 UNIT/ML, Inject 30 Units into the skin every morning., Disp: , Rfl:    lisinopril  (ZESTRIL ) 30 MG tablet, TAKE 1 TABLET (30 MG TOTAL) BY MOUTH DAILY. FOR BLOOD PRESSURE., Disp: 90 tablet, Rfl: 2   metFORMIN  (GLUCOPHAGE ) 500 MG tablet, TAKE 2 TABLETS BY MOUTH TWICE A DAY WITH MEAL FOR DIABETES (Patient taking differently: Take 1,000 mg by mouth 2 (two) times daily. TAKE 2 TABLETS BY MOUTH TWICE A DAY WITH MEAL FOR DIABETES), Disp: 360 tablet, Rfl: 2   pantoprazole  (PROTONIX ) 40 MG tablet, TAKE 1 TABLET (40 MG TOTAL) BY MOUTH DAILY. FOR HEARTBURN, Disp: 90 tablet, Rfl: 2   sildenafil  (VIAGRA ) 100 MG tablet, Take 1 tablet (100 mg total) by mouth daily as needed for erectile dysfunction., Disp: 30 tablet,  Rfl: 0   tamsulosin  (FLOMAX ) 0.4 MG CAPS capsule, Take 1 capsule (0.4 mg total) by mouth 2 (two) times daily., Disp: 180 capsule, Rfl: 3   vitamin B-12 (CYANOCOBALAMIN) 1000 MCG tablet, Take 1,000 mcg by mouth in  the morning., Disp: , Rfl:  No current facility-administered medications for this visit.  Facility-Administered Medications Ordered in Other Visits:    sodium chloride  flush (NS) 0.9 % injection 10 mL, 10 mL, Intracatheter, PRN, Melanee Annah BROCKS, MD, 10 mL at 01/19/23 1205  Physical exam:  Vitals:   03/16/23 0846  BP: (!) 160/63  Pulse: 63  Resp: 18  Temp: (!) 96.5 F (35.8 C)  TempSrc: Tympanic  SpO2: 99%  Weight: 180 lb (81.6 kg)   Physical Exam Cardiovascular:     Rate and Rhythm: Normal rate and regular rhythm.     Heart sounds: Normal heart sounds.  Pulmonary:     Effort: Pulmonary effort is normal.     Comments: Scattered bilateral wheezing Abdominal:     General: Bowel sounds are normal.     Palpations: Abdomen is soft.  Skin:    General: Skin is warm and dry.  Neurological:     Mental Status: He is alert and oriented to person, place, and time.         Latest Ref Rng & Units 02/16/2023    8:57 AM  CMP  Glucose 70 - 99 mg/dL 830   BUN 8 - 23 mg/dL 22   Creatinine 9.38 - 1.24 mg/dL 9.14   Sodium 864 - 854 mmol/L 141   Potassium 3.5 - 5.1 mmol/L 4.5   Chloride 98 - 111 mmol/L 111   CO2 22 - 32 mmol/L 22   Calcium  8.9 - 10.3 mg/dL 8.9   Total Protein 6.5 - 8.1 g/dL 6.5   Total Bilirubin 0.0 - 1.2 mg/dL 0.5   Alkaline Phos 38 - 126 U/L 55   AST 15 - 41 U/L 16   ALT 0 - 44 U/L 11       Latest Ref Rng & Units 02/16/2023    8:57 AM  CBC  WBC 4.0 - 10.5 K/uL 6.3   Hemoglobin 13.0 - 17.0 g/dL 89.1   Hematocrit 60.9 - 52.0 % 34.0   Platelets 150 - 400 K/uL 246      Assessment and plan- Patient is a 78 y.o. male with locally recurrent stage III non-small cell lung cancer here for on treatment assessment prior to cycle 7 of maintenance durvalumab   I have reviewed CT chest abdomen pelvis images and evidently discussed Findings with the patient which does not show any evidence of recurrent or progressive disease.  I am waiting for official radiology read as  well.  He will proceed with cycle 7 of maintenance durvalumab  today.  He will be seen by covering provider in 4 weeks for cycle 8 and I will see him back in 8 weeks for cycle 9   Visit Diagnosis 1. Encounter for antineoplastic immunotherapy   2. Non-small cell cancer of right lung [facility])      Dr. Annah Melanee, MD, MPH [facility] [phone] 03/16/2023 8:41 AM

## 2023-04-13 ENCOUNTER — Inpatient Hospital Stay: Payer: Medicare Other

## 2023-04-13 ENCOUNTER — Inpatient Hospital Stay: Payer: Medicare Other | Attending: Oncology

## 2023-04-13 ENCOUNTER — Ambulatory Visit: Payer: Medicare Other | Admitting: Oncology

## 2023-04-13 ENCOUNTER — Ambulatory Visit: Payer: Medicare Other

## 2023-04-13 ENCOUNTER — Inpatient Hospital Stay (HOSPITAL_BASED_OUTPATIENT_CLINIC_OR_DEPARTMENT_OTHER): Payer: Medicare Other | Admitting: Oncology

## 2023-04-13 ENCOUNTER — Encounter: Payer: Self-pay | Admitting: Oncology

## 2023-04-13 ENCOUNTER — Other Ambulatory Visit: Payer: Medicare Other

## 2023-04-13 VITALS — BP 167/80 | HR 87 | Temp 96.5°F | Resp 18 | Ht 67.0 in | Wt 182.6 lb

## 2023-04-13 DIAGNOSIS — E079 Disorder of thyroid, unspecified: Secondary | ICD-10-CM

## 2023-04-13 DIAGNOSIS — Z5112 Encounter for antineoplastic immunotherapy: Secondary | ICD-10-CM | POA: Diagnosis not present

## 2023-04-13 DIAGNOSIS — Z79899 Other long term (current) drug therapy: Secondary | ICD-10-CM | POA: Insufficient documentation

## 2023-04-13 DIAGNOSIS — Z87891 Personal history of nicotine dependence: Secondary | ICD-10-CM | POA: Insufficient documentation

## 2023-04-13 DIAGNOSIS — C3490 Malignant neoplasm of unspecified part of unspecified bronchus or lung: Secondary | ICD-10-CM

## 2023-04-13 DIAGNOSIS — C3412 Malignant neoplasm of upper lobe, left bronchus or lung: Secondary | ICD-10-CM | POA: Diagnosis not present

## 2023-04-13 LAB — CMP (CANCER CENTER ONLY)
ALT: 10 U/L (ref 0–44)
AST: 16 U/L (ref 15–41)
Albumin: 3.4 g/dL — ABNORMAL LOW (ref 3.5–5.0)
Alkaline Phosphatase: 54 U/L (ref 38–126)
Anion gap: 7 (ref 5–15)
BUN: 16 mg/dL (ref 8–23)
CO2: 23 mmol/L (ref 22–32)
Calcium: 8.3 mg/dL — ABNORMAL LOW (ref 8.9–10.3)
Chloride: 109 mmol/L (ref 98–111)
Creatinine: 0.93 mg/dL (ref 0.61–1.24)
GFR, Estimated: >60 mL/min (ref >60)
Glucose, Bld: 144 mg/dL — ABNORMAL HIGH (ref 70–99)
Potassium: 4.2 mmol/L (ref 3.5–5.1)
Sodium: 139 mmol/L (ref 135–145)
Total Bilirubin: 0.3 mg/dL (ref 0.0–1.2)
Total Protein: 6.3 g/dL — ABNORMAL LOW (ref 6.5–8.1)

## 2023-04-13 LAB — CBC WITH DIFFERENTIAL (CANCER CENTER ONLY)
Abs Immature Granulocytes: 0.05 10*3/uL (ref 0.00–0.07)
Basophils Absolute: 0.1 10*3/uL (ref 0.0–0.1)
Basophils Relative: 1 %
Eosinophils Absolute: 0.3 10*3/uL (ref 0.0–0.5)
Eosinophils Relative: 4 %
HCT: 33 % — ABNORMAL LOW (ref 39.0–52.0)
Hemoglobin: 10.7 g/dL — ABNORMAL LOW (ref 13.0–17.0)
Immature Granulocytes: 1 %
Lymphocytes Relative: 14 %
Lymphs Abs: 0.9 10*3/uL (ref 0.7–4.0)
MCH: 28.7 pg (ref 26.0–34.0)
MCHC: 32.4 g/dL (ref 30.0–36.0)
MCV: 88.5 fL (ref 80.0–100.0)
Monocytes Absolute: 0.5 10*3/uL (ref 0.1–1.0)
Monocytes Relative: 8 %
Neutro Abs: 5 10*3/uL (ref 1.7–7.7)
Neutrophils Relative %: 72 %
Platelet Count: 232 10*3/uL (ref 150–400)
RBC: 3.73 MIL/uL — ABNORMAL LOW (ref 4.22–5.81)
RDW: 14.5 % (ref 11.5–15.5)
WBC Count: 6.8 10*3/uL (ref 4.0–10.5)
nRBC: 0 % (ref 0.0–0.2)

## 2023-04-13 MED ORDER — SODIUM CHLORIDE 0.9% FLUSH
10.0000 mL | INTRAVENOUS | Status: DC | PRN
  Administered 2023-04-13: 10 mL via INTRAVENOUS
  Filled 2023-04-13: qty 10

## 2023-04-13 MED ORDER — SODIUM CHLORIDE 0.9 % IV SOLN
1500.0000 mg | Freq: Once | INTRAVENOUS | Status: AC
  Administered 2023-04-13: 1500 mg via INTRAVENOUS
  Filled 2023-04-13: qty 30

## 2023-04-13 MED ORDER — HEPARIN SOD (PORK) LOCK FLUSH 100 UNIT/ML IV SOLN
500.0000 [IU] | Freq: Once | INTRAVENOUS | Status: DC | PRN
  Filled 2023-04-13: qty 5

## 2023-04-13 MED ORDER — SODIUM CHLORIDE 0.9 % IV SOLN
Freq: Once | INTRAVENOUS | Status: AC
  Filled 2023-04-13: qty 250

## 2023-04-13 NOTE — Patient Instructions (Signed)
 [facility] - A DEPT OF [facility]  Discharge Instructions: Thank you for choosing [facility] to provide your oncology and hematology care.  If you have a lab appointment with the Cancer Center, please go directly to the Cancer Center and check in at the registration area.  Wear comfortable clothing and clothing appropriate for easy access to any Portacath or PICC line.   We strive to give you quality time with your provider. You may need to reschedule your appointment if you arrive late (15 or more minutes).  Arriving late affects you and other patients whose appointments are after yours.  Also, if you miss three or more appointments without notifying the office, you may be dismissed from the clinic at the provider's discretion.      For prescription refill requests, have your pharmacy contact our office and allow 72 hours for refills to be completed.    Today you received the following chemotherapy and/or immunotherapy agents Imfinzi      To help prevent nausea and vomiting after your treatment, we encourage you to take your nausea medication as directed.  BELOW ARE SYMPTOMS THAT SHOULD BE REPORTED IMMEDIATELY: *FEVER GREATER THAN 100.4 F (38 C) OR HIGHER *CHILLS OR SWEATING *NAUSEA AND VOMITING THAT IS NOT CONTROLLED WITH YOUR NAUSEA MEDICATION *UNUSUAL SHORTNESS OF BREATH *UNUSUAL BRUISING OR BLEEDING *URINARY PROBLEMS (pain or burning when urinating, or frequent urination) *BOWEL PROBLEMS (unusual diarrhea, constipation, pain near the anus) TENDERNESS IN MOUTH AND THROAT WITH OR WITHOUT PRESENCE OF ULCERS (sore throat, sores in mouth, or a toothache) UNUSUAL RASH, SWELLING OR PAIN  UNUSUAL VAGINAL DISCHARGE OR ITCHING   Items with * indicate a potential emergency and should be followed up as soon as possible or go to the Emergency Department if any problems should occur.  Please show the CHEMOTHERAPY ALERT CARD or IMMUNOTHERAPY  ALERT CARD at check-in to the Emergency Department and triage nurse.  Should you have questions after your visit or need to cancel or reschedule your appointment, please contact [facility] - A DEPT OF [facility]  [phone] and follow the prompts.  Office hours are 8:00 a.m. to 4:30 p.m. Monday - Friday. Please note that voicemails left after 4:00 p.m. may not be returned until the following business day.  We are closed weekends and major holidays. You have access to a nurse at all times for urgent questions. Please call the main number to the clinic [phone] and follow the prompts.  For any non-urgent questions, you may also contact your provider using MyChart. We now offer e-Visits for anyone 1 and older to request care online for non-urgent symptoms. For details visit [email or web address].   Also download the MyChart app! Go to the app store, search "MyChart", open the app, select [place], and log in with your MyChart username and password.

## 2023-04-13 NOTE — Progress Notes (Signed)
 Patient has no concerns today.

## 2023-04-22 NOTE — Progress Notes (Signed)
 Hematology/Oncology Consult note [facility]  [phone] [phone]  Patient Care Team: Doreene Nest, NP as PCP - General (Internal Medicine) Debbe Odea, MD as PCP - Cardiology (Cardiology) Duke Salvia, MD as PCP - Electrophysiology (Cardiology) Harle Battiest, PA-C as Physician Assistant (Urology) Meryl Dare, MD (Inactive) as Consulting Physician (Gastroenterology) Kathyrn Sheriff, [facility] (Inactive) as Pharmacist (Pharmacist) Glory Buff, RN as Oncology Nurse Navigator Creig Hines, MD as Consulting Physician (Oncology) Galen Manila, MD as Referring Physician (Ophthalmology) Creig Hines, MD as Consulting Physician (Oncology)   Name of the patient: Keymani Mclean  [id number]  1945/06/02   Date of visit: 04/22/23  Diagnosis- locally recurrent adenocarcinoma of the lung     Chief complaint/ Reason for visit-assessment prior to cycle 8 of maintenance durvalumab  Heme/Onc history: patient is a 78 year old male who was diagnosed with stage I non-small cell lung cancer involving the left upper lobe about 1 year ago and he received SBRT for the same. He has been undergoing surveillance CT scans since then. CT chest in April 2024 showed increasing areas of disease with left-sided mediastinal and hilar lymph nodes and soft tissue along the left main bronchus. 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 No residual focal hypermetabolism in the treated area of the left upper lobe. No evidence of distant metastatic disease. He underwent MRI brain without contrast in February 2024 which did not show any evidence of metastatic disease. Patient was seen by Dr. Jayme Cloud and underwent EBUS guided biopsies left paratracheal lymph node was biopsied and consistent with pulmonary adenocarcinoma.  Patient completed concurrent  chemoradiation with weekly CarboTaxol chemotherapy in July 2024.  Not enough tissue available for NGS testing.  Circulogene assay was sent by pulmonary and peripheral blood NGS testing did not show any evidence of EGFR or ALK mutation    Patient started on maintenance durvalumab in August 2024  Interval history-overall tolerating durvalumab well and denies any new complaints at this time.  He does have on and off diarrhea which has remained stable and has persisted even prior to start of durvalumab.  Overall this is mild and not affecting his quality of life.  ECOG PS- 1 Pain scale- 0   Review of systems- Review of Systems  Constitutional:  Negative for chills, fever, malaise/fatigue and weight loss.  HENT:  Negative for congestion, ear discharge and nosebleeds.   Eyes:  Negative for blurred vision.  Respiratory:  Negative for cough, hemoptysis, sputum production, shortness of breath and wheezing.   Cardiovascular:  Negative for chest pain, palpitations, orthopnea and claudication.  Gastrointestinal:  Negative for abdominal pain, blood in stool, constipation, diarrhea, heartburn, melena, nausea and vomiting.  Genitourinary:  Negative for dysuria, flank pain, frequency, hematuria and urgency.  Musculoskeletal:  Negative for back pain, joint pain and myalgias.  Skin:  Negative for rash.  Neurological:  Negative for dizziness, tingling, focal weakness, seizures, weakness and headaches.  Endo/Heme/Allergies:  Does not bruise/bleed easily.  Psychiatric/Behavioral:  Negative for depression and suicidal ideas. The patient does not have insomnia.       No Known Allergies   Past Medical History:  Diagnosis Date   Adenoma of left adrenal gland    Anemia    Aortic atherosclerosis (HCC)    Arthritis    Atrial fibrillation and flutter (HCC)    a.) CHA2DS2VASc = 7 (age  x 2, HTN, CVA x 2, vascular disease history, T2DM);  b.) rate/rhythm maintained without pharmacological intervention; chronic  antithrombotic therapy using ASA + clopidogrel   Bilateral carotid artery stenosis    a.) s/p PTA 04/07/2022 --> 90% RICA --> 9x7x30 Exact stent; b.) doppler 03/14/20214: 1-39% RICA, 40-59% LICA   BPH with urinary obstruction    stable on flomax ([name]   Childhood asthma    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   a.) CT head and MRI brain 04/05/2022: chronic cortical/subcortical RIGHT MCA territory infarct within the RIGHT parietal lobe and RIGHT temporoparietal junction   Coronary atherosclerosis of unspecified type of vessel, native or graft    a.) cCTA 05/22/2021: Ca score 1524 (84th percentile for age/sex match control) --> FFRct: dLAD = 0.77, dLCx = 0.67, RPLA = 0.77, dOM1 = 0.66; b.) LHC 05/23/2021: 20% dRCA, 50% OM1 - med mgmt   COVID-19 virus infection 12/07/2022   Diastolic dysfunction    a.) TTE 04/06/2022: EF 60-65%, mod LVH, mild MR, AoV sclerosis, G1DD   DOE (dyspnea on exertion)    Emphysema of lung (HCC)    Erectile dysfunction    a.) on PDE5i (sildenafil)   Esophageal stricture    Fall    Gastric AVM    Gastritis    GERD (gastroesophageal reflux disease)    Hematoma 05/22/2022   Hiatal hernia    Hx of colonic polyps    Hyperlipidemia    Hypertension    Internal hemorrhoids without mention of complication    Ischemic leg    Long term current use of antithrombotics/antiplatelets    a.) on DAPT (ASA + clopidogrel)   Lumbago    Mobitz (type) I (Wenckebach's) atrioventricular block    Non-small cell lung cancer (HCC)    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   a.) PCR (+) 05/29/2018, 04/07/2022   Pain in limb 12/11/2017   Pneumonia    PSA elevation    now averaging 2's   PVD (peripheral vascular disease) with claudication (HCC)    Sigmoid diverticulosis    Tobacco abuse    Type 2 diabetes mellitus treated with insulin [facility])      Past Surgical History:  Procedure Laterality Date   2D Echo  07/2001   BACK SURGERY     CAROTID PTA/STENT INTERVENTION  Right 04/07/2022   Procedure: CAROTID PTA/STENT INTERVENTION;  Surgeon: Renford Dills, MD;  Location: [facility] INVASIVE CV LAB;  Service: Cardiovascular;  Laterality: Right;   COLONOSCOPY     ENDOBRONCHIAL ULTRASOUND Left 06/19/2022   Procedure: ENDOBRONCHIAL ULTRASOUND;  Surgeon: Salena Saner, MD;  Location: [facility] ORS;  Service: Pulmonary;  Laterality: Left;   ESOPHAGOGASTRODUODENOSCOPY  02/13/2007   gastritis and duodenitis without bleed   ESOPHAGOGASTRODUODENOSCOPY N/A 06/26/2014   Procedure: ESOPHAGOGASTRODUODENOSCOPY (EGD);  Surgeon: Rachael Fee, MD;  Location: [facility] ENDOSCOPY;  Service: Endoscopy;  Laterality: N/A;   ESOPHAGOGASTRODUODENOSCOPY (EGD) WITH PROPOFOL N/A 04/08/2020   Procedure: ESOPHAGOGASTRODUODENOSCOPY (EGD) WITH PROPOFOL;  Surgeon: Meryl Dare, MD;  Location: [facility] ENDOSCOPY;  Service: Endoscopy;  Laterality: N/A;   FLEXIBLE BRONCHOSCOPY Left 06/19/2022   Procedure: FLEXIBLE BRONCHOSCOPY;  Surgeon: Salena Saner, MD;  Location: [facility] ORS;  Service: Pulmonary;  Laterality: Left;   HOT HEMOSTASIS N/A 04/08/2020   Procedure: HOT HEMOSTASIS (ARGON PLASMA COAGULATION/BICAP);  Surgeon: Meryl Dare, MD;  Location: [facility] ENDOSCOPY;  Service: Endoscopy;  Laterality: N/A;   IR IMAGING GUIDED PORT INSERTION  06/30/2022   KNEE ARTHROSCOPY Right 11/1998   LEFT  HEART CATH AND CORONARY ANGIOGRAPHY N/A 05/23/2021   Procedure: LEFT HEART CATH AND CORONARY ANGIOGRAPHY;  Surgeon: Orbie Pyo, MD;  Location: [facility] INVASIVE CV LAB;  Service: Cardiovascular;  Laterality: N/A;   LOWER EXTREMITY ANGIOGRAPHY Right 12/27/2017   Procedure: LOWER EXTREMITY ANGIOGRAPHY;  Surgeon: Annice Needy, MD;  Location: [facility] INVASIVE CV LAB;  Service: Cardiovascular;  Laterality: Right;   LOWER EXTREMITY ANGIOGRAPHY Left 01/10/2018   Procedure: LOWER EXTREMITY ANGIOGRAPHY;  Surgeon: Annice Needy, MD;  Location: [facility] INV[facility]IVE CV LAB;  Service: Cardiovascular;  Laterality: Left;   LOWER EXTREMITY  ANGIOGRAPHY Left 05/29/2018   Procedure: LOWER EXTREMITY ANGIOGRAPHY;  Surgeon: Annice Needy, MD;  Location: [facility] INVASIVE CV LAB;  Service: Cardiovascular;  Laterality: Left;   LOWER EXTREMITY ANGIOGRAPHY Left 05/30/2018   Procedure: Lower Extremity Angiography;  Surgeon: Annice Needy, MD;  Location: [facility] INVASIVE CV LAB;  Service: Cardiovascular;  Laterality: Left;   LP  07/2001   Microwave thermotherapy prostate  08/28/2007   [place]   Persantine cardiolite  04/1999   EF 68%   POLYPECTOMY  12/1993   Stress myoview  08/2004   EF 67%   UPPER GASTROINTESTINAL ENDOSCOPY      Social History   Socioeconomic History   Marital status: Widowed    Spouse name: Armando Reichert   Number of children: 3   Years of education: Not on file   Highest education level: Not on file  Occupational History   Occupation: Retired Music therapist  Tobacco Use   Smoking status: Former    Current packs/day: 0.00    Average packs/day: 2.0 packs/day for 57.3 years (114.5 ttl pk-yrs)    Types: Cigarettes    Start date: 02/07/1964    Quit date: 05/11/2021    Years since quitting: 1.9   Smokeless tobacco: Former    Types: Chew   Tobacco comments:    Quit in 2023- khj  Vaping Use   Vaping status: Never Used  Substance and Sexual Activity   Alcohol use: Yes    Comment: rare   Drug use: No   Sexual activity: Yes  Other Topics Concern   Not on file  Social History Narrative   Married 9/09 wife with moderate memory problems.   3 adult children, 2 grandchildren   Would desire CPR   Social Drivers of Health   Financial Resource Strain: Low Risk  (01/25/2023)   Overall Financial Resource Strain (CARDIA)    Difficulty of Paying Living Expenses: Not very hard  Food Insecurity: No Food Insecurity (01/25/2023)   Hunger Vital Sign    Worried About Running Out of Food in the Last Year: Never true    Ran Out of Food in the Last Year: Never true  Transportation Needs: No Transportation Needs (01/25/2023)   PRAPARE -  Administrator, Civil Service (Medical): No    Lack of Transportation (Non-Medical): No  Physical Activity: Inactive (01/25/2023)   Exercise Vital Sign    Days of Exercise per Week: 0 days    Minutes of Exercise per Session: 0 min  Stress: No Stress Concern Present (01/25/2023)   [employer] of Occupational Health - Occupational Stress Questionnaire    Feeling of Stress : Only a little  Social Connections: Moderately Isolated (01/25/2023)   Social Connection and Isolation Panel [NHANES]    Frequency of Communication with Friends and Family: Twice a week    Frequency of Social Gatherings with Friends and Family: Once a week  Attends Religious Services: More than 4 times per year    Active Member of Clubs or Organizations: No    Attends Banker Meetings: Never    Marital Status: Widowed  Intimate Partner Violence: Not At Risk (01/25/2023)   Humiliation, Afraid, Rape, and Kick questionnaire    Fear of Current or Ex-Partner: No    Emotionally Abused: No    Physically Abused: No    Sexually Abused: No    Family History  Problem Relation Age of Onset   Rheum arthritis Mother    Diabetes Father    Alcohol abuse Paternal Uncle    Alcohol abuse Paternal Uncle    Alcohol abuse Paternal Uncle    Heart disease Neg Hx    Stroke Neg Hx    Cancer Neg Hx    Drug abuse Neg Hx    Depression Neg Hx    Colon cancer Neg Hx    Rectal cancer Neg Hx    Stomach cancer Neg Hx    Kidney cancer Neg Hx    Bladder Cancer Neg Hx    Prostate cancer Neg Hx      Current Outpatient Medications:    acetaminophen (TYLENOL) 500 MG tablet, Take 1,000 mg by mouth every 6 (six) hours. Rapid release, Disp: , Rfl:    albuterol (VENTOLIN HFA) 108 (90 Base) MCG/ACT inhaler, Inhale 2 puffs into the lungs every 6 (six) hours as needed for wheezing or shortness of breath., Disp: 8 g, Rfl: 2   atorvastatin (LIPITOR) 20 MG tablet, TAKE 1 TABLET BY MOUTH IN THE EVENING FOR CHOLESTEROL,  Disp: 90 tablet, Rfl: 2   clopidogrel (PLAVIX) 75 MG tablet, Take 1 tablet (75 mg total) by mouth daily., Disp: 90 tablet, Rfl: 3   ferrous gluconate (FERGON) 324 MG tablet, TAKE 1 TABLET BY MOUTH EVERY DAY WITH BREAKFAST, Disp: 30 tablet, Rfl: 0   FIASP FLEXTOUCH 100 UNIT/ML FlexTouch Pen, Inject 10 units before lunch meal each day., Disp: , Rfl:    FIBER PO, Take 1 capsule by mouth in the morning., Disp: , Rfl:    finasteride (PROSCAR) 5 MG tablet, Take 1 tablet (5 mg total) by mouth daily. (Patient taking differently: Take 5 mg by mouth every morning.), Disp: 90 tablet, Rfl: 3   Fluticasone-Umeclidin-Vilant (TRELEGY ELLIPTA) 100-62.5-25 MCG/ACT AEPB, Inhale 1 puff into the lungs daily., Disp: 28 each, Rfl: 11   glipiZIDE (GLUCOTROL) 10 MG tablet, Take 10 mg by mouth daily before breakfast., Disp: , Rfl:    GLUCOSAMINE HCL PO, Take 1 tablet by mouth daily., Disp: , Rfl:    insulin aspart (NOVOLOG) 100 UNIT/ML injection, Inject 10 Units into the skin once., Disp: , Rfl:    Insulin Glargine (BASAGLAR KWIKPEN) 100 UNIT/ML, Inject 30 Units into the skin every morning., Disp: , Rfl:    lisinopril (ZESTRIL) 30 MG tablet, TAKE 1 TABLET (30 MG TOTAL) BY MOUTH DAILY. FOR BLOOD PRESSURE., Disp: 90 tablet, Rfl: 2   metFORMIN (GLUCOPHAGE) 500 MG tablet, TAKE 2 TABLETS BY MOUTH TWICE A DAY WITH MEAL FOR DIABETES (Patient taking differently: Take 1,000 mg by mouth 2 (two) times daily. TAKE 2 TABLETS BY MOUTH TWICE A DAY WITH MEAL FOR DIABETES), Disp: 360 tablet, Rfl: 2   pantoprazole (PROTONIX) 40 MG tablet, TAKE 1 TABLET (40 MG TOTAL) BY MOUTH DAILY. FOR HEARTBURN, Disp: 90 tablet, Rfl: 2   sildenafil (VIAGRA) 100 MG tablet, Take 1 tablet (100 mg total) by mouth daily as needed for erectile dysfunction., Disp:  30 tablet, Rfl: 0   tamsulosin (FLOMAX) 0.4 MG CAPS capsule, Take 1 capsule (0.4 mg total) by mouth 2 (two) times daily., Disp: 180 capsule, Rfl: 3   vitamin B-12 (CYANOCOBALAMIN) 1000 MCG tablet, Take  1,000 mcg by mouth in the morning., Disp: , Rfl:  No current facility-administered medications for this visit.  Facility-Administered Medications Ordered in Other Visits:    sodium chloride flush (NS) 0.9 % injection 10 mL, 10 mL, Intracatheter, PRN, Creig Hines, MD, 10 mL at 01/19/23 1205  Physical exam:  Vitals:   04/13/23 0931  BP: (!) 167/80  Pulse: 87  Resp: 18  Temp: (!) 96.5 F (35.8 C)  TempSrc: Tympanic  SpO2: 97%  Weight: 182 lb 9.6 oz (82.8 kg)  Height: 5\' 7"  (1.702 m)   Physical Exam Cardiovascular:     Rate and Rhythm: Normal rate and regular rhythm.     Heart sounds: Normal heart sounds.  Pulmonary:     Effort: Pulmonary effort is normal.     Breath sounds: Normal breath sounds.  Skin:    General: Skin is warm and dry.  Neurological:     Mental Status: He is alert and oriented to person, place, and time.         Latest Ref Rng & Units 04/13/2023    9:03 AM  CMP  Glucose 70 - 99 mg/dL 191   BUN 8 - 23 mg/dL 16   Creatinine 4.78 - 1.24 mg/dL 2.95   Sodium 621 - 308 mmol/L 139   Potassium 3.5 - 5.1 mmol/L 4.2   Chloride 98 - 111 mmol/L 109   CO2 22 - 32 mmol/L 23   Calcium 8.9 - 10.3 mg/dL 8.3   Total Protein 6.5 - 8.1 g/dL 6.3   Total Bilirubin 0.0 - 1.2 mg/dL 0.3   Alkaline Phos 38 - 126 U/L 54   AST 15 - 41 U/L 16   ALT 0 - 44 U/L 10       Latest Ref Rng & Units 04/13/2023    9:03 AM  CBC  WBC 4.0 - 10.5 K/uL 6.8   Hemoglobin 13.0 - 17.0 g/dL 65.7   Hematocrit 84.6 - 52.0 % 33.0   Platelets 150 - 400 K/uL 232     No images are attached to the encounter.  No results found.   Assessment and plan- Patient is a 78 y.o. male with locally recurrent stage III non-small cell lung cancer here for on treatment assessment prior to cycle 8 of maintenance durvalumab  Counts okay to proceed with cycle 8 of maintenance durvalumab today.  I will see him back in 4 weeks for cycle9.  Again discussed the results of CT chest abdomen and pelvis with  contrast from January 2025 which shows consolidative opacity in the right retrohilar lower lobe which may be nonspecific infectious versus metastatic disease.  My plan is to repeat another CT in 3 months   Visit Diagnosis 1. Recurrent non-small cell lung cancer (HCC)   2. Thyroid condition   3. Encounter for antineoplastic immunotherapy      Dr. Owens Shark, MD, MPH [facility] [phone] 04/22/2023 7:02 PM

## 2023-04-23 ENCOUNTER — Other Ambulatory Visit: Payer: Self-pay | Admitting: Urology

## 2023-04-23 ENCOUNTER — Other Ambulatory Visit (INDEPENDENT_AMBULATORY_CARE_PROVIDER_SITE_OTHER): Payer: Self-pay | Admitting: Vascular Surgery

## 2023-04-23 DIAGNOSIS — N138 Other obstructive and reflux uropathy: Secondary | ICD-10-CM

## 2023-04-26 ENCOUNTER — Ambulatory Visit: Payer: Medicare Other | Admitting: Internal Medicine

## 2023-04-26 DIAGNOSIS — E1165 Type 2 diabetes mellitus with hyperglycemia: Secondary | ICD-10-CM | POA: Diagnosis not present

## 2023-05-01 DIAGNOSIS — I951 Orthostatic hypotension: Secondary | ICD-10-CM | POA: Insufficient documentation

## 2023-05-01 DIAGNOSIS — R001 Bradycardia, unspecified: Secondary | ICD-10-CM | POA: Insufficient documentation

## 2023-05-01 DIAGNOSIS — I441 Atrioventricular block, second degree: Secondary | ICD-10-CM | POA: Insufficient documentation

## 2023-05-01 NOTE — Progress Notes (Unsigned)
 Electrophysiology Clinic Note    Date:  05/02/2023  Patient ID:  Antonio, [name] Nov 18, 1945, MRN [id number] PCP:  Doreene Nest, NP  Cardiologist:  Debbe Odea, MD Electrophysiologist: Sherryl Manges, MD    Discussed the use of AI scribe software for clinical note transcription with the patient, who gave verbal consent to proceed.   Patient Profile    Chief Complaint: bradycardia follow-up  History of Present Illness: Antonio [name] is a 78 y.o. male with PMH notable for 2nd deg mobitz type 1, bradycardia,  PVD, HTN, non-obs CAD, orthostatic lightheadedness, syncope, NSCLC, COPD; seen today for Sherryl Manges, MD for routine electrophysiology followup.  Historically, dizziness, LH did not correlate with bradycardia   I last saw him 10/2022 after he had finished chemo and radiation. Sense of taste had returned and appetite improving, continued to have orthostatic hypotensive.   On follow-up today, he is overall doing very well. He has 4 more immunotherapy infusions for his lung cancer. He has a small lesion on his right lung that oncology is monitoring, but is too small to biopsy or treat at this time. His sense of taste has returned and he has a good appetite. He uses a cane for ongoing balance difficulties.  No episodes of syncope in a long while. He does continue to have dizziness if he stands too quickly and starts walking, no recent falls.    Arrhythmia/Device History No specialty comments available.    ROS:  Please see the history of present illness. All other systems are reviewed and otherwise negative.    Physical Exam    VS:  BP (!) 148/82 (BP Location: Left Arm, Patient Position: Sitting, Cuff Size: Normal)   Pulse 60   Ht 5\' 6"  (1.676 m)   Wt 181 lb 6.4 oz (82.3 kg)   SpO2 97%   BMI 29.28 kg/m  BMI: Body mass index is 29.28 kg/m.  Wt Readings from Last 3 Encounters:  05/02/23 181 lb 6.4 oz (82.3 kg)  04/13/23 182 lb 9.6 oz (82.8 kg)  03/16/23  180 lb (81.6 kg)     GEN- The patient is well appearing, alert and oriented x 3 today.   Lungs- Clear to ausculation bilaterally, normal work of breathing.  Heart-  regularly irregular  rate and rhythm, no murmurs, rubs or gallops Extremities- No peripheral edema, warm, dry    Studies Reviewed   Previous EP, cardiology notes.    EKG is ordered. Personal review of EKG from today shows:    EKG Interpretation Date/Time:  Wednesday May 02 2023 09:58:43 EDT Ventricular Rate:  60 PR Interval:    QRS Duration:  80 QT Interval:  402 QTC Calculation: 402 R Axis:   -38  Text Interpretation: Mobitz I 2-degree AV block (Wenckebach block) Left axis deviation Confirmed by Sherie Don [phone]) on 05/02/2023 10:31:32 AM    Cardiac amyloid PYP, 11/15/2022 Visual and quantitative assessment (grade 1, H/CL equal 1.0) are NOT suggestive of transthyretin amyloidosis.   TTE, 04/06/2022 1. Left ventricular ejection fraction, by estimation, is 60 to 65%. The left ventricle has normal function. The left ventricle has no regional wall motion abnormalities. There is moderate left ventricular hypertrophy. Left ventricular diastolic  parameters are consistent with Grade I diastolic dysfunction (impaired relaxation).   2. Right ventricular systolic function is normal. The right ventricular size is normal. There is normal pulmonary artery systolic pressure. The estimated right ventricular systolic pressure is 17.7 mmHg.   3. The  mitral valve is normal in structure. Mild mitral valve regurgitation. No evidence of mitral stenosis.   4. The aortic valve is tricuspid. Aortic valve regurgitation is not visualized. Aortic valve sclerosis is present, with no evidence of aortic valve stenosis.   5. The inferior vena cava is normal in size with greater than 50% respiratory variability, suggesting right atrial pressure of 3 mmHg.     Assessment and Plan     #) second deg AV type 1 #) bradycardia No further  episodes of syncope.  EKG with wenckebach at 60bpm No PPM indication at this time  #) HTN #) orthostatic hypotension Continues to be symptomatic with position changes, but is aware of behavior modifications  No recent falls Previously BP dropped with lying > standing BP at goal today Continue 30mg  lisinopril Continue to follow up with general cardiology          Current medicines are reviewed at length with the patient today.   The patient does not have concerns regarding his medicines.  The following changes were made today:  none  Labs/ tests ordered today include:  Orders Placed This Encounter  Procedures   EKG 12-Lead     Disposition: Follow up with Dr. Graciela Husbands or EP APP  PRN    Signed, Sherie Don, NP  05/02/23  10:33 AM  Electrophysiology [facility]

## 2023-05-02 ENCOUNTER — Ambulatory Visit: Attending: Cardiology | Admitting: Cardiology

## 2023-05-02 VITALS — BP 148/82 | HR 60 | Ht 66.0 in | Wt 181.4 lb

## 2023-05-02 DIAGNOSIS — I10 Essential (primary) hypertension: Secondary | ICD-10-CM | POA: Insufficient documentation

## 2023-05-02 DIAGNOSIS — R001 Bradycardia, unspecified: Secondary | ICD-10-CM | POA: Diagnosis not present

## 2023-05-02 DIAGNOSIS — I441 Atrioventricular block, second degree: Secondary | ICD-10-CM | POA: Insufficient documentation

## 2023-05-02 DIAGNOSIS — I951 Orthostatic hypotension: Secondary | ICD-10-CM | POA: Diagnosis not present

## 2023-05-02 NOTE — Patient Instructions (Signed)
 Medication Instructions:  The current medical regimen is effective;  continue present plan and medications.  *If you need a refill on your cardiac medications before your next appointment, please call your pharmacy*  Follow-Up: At [facility], you and your health needs are our priority.  As part of our continuing mission to provide you with exceptional heart care, we have created designated Provider Care Teams.  These Care Teams include your primary Cardiologist (physician) and Advanced Practice Providers (APPs -  Physician Assistants and Nurse Practitioners) who all work together to provide you with the care you need, when you need it.  We recommend signing up for the patient portal called "MyChart".  Sign up information is provided on this After Visit Summary.  MyChart is used to connect with patients for Virtual Visits (Telemedicine).  Patients are able to view lab/test results, encounter notes, upcoming appointments, etc.  Non-urgent messages can be sent to your provider as well.   To learn more about what you can do with MyChart, go to [email or web address].    Your next appointment:   As needed  Provider:   Sherryl Manges, MD or Sherie Don, NP

## 2023-05-03 ENCOUNTER — Other Ambulatory Visit: Payer: Self-pay | Admitting: Urology

## 2023-05-03 DIAGNOSIS — E1142 Type 2 diabetes mellitus with diabetic polyneuropathy: Secondary | ICD-10-CM | POA: Diagnosis not present

## 2023-05-03 DIAGNOSIS — E1159 Type 2 diabetes mellitus with other circulatory complications: Secondary | ICD-10-CM | POA: Diagnosis not present

## 2023-05-03 DIAGNOSIS — R809 Proteinuria, unspecified: Secondary | ICD-10-CM | POA: Diagnosis not present

## 2023-05-03 DIAGNOSIS — I10 Essential (primary) hypertension: Secondary | ICD-10-CM | POA: Diagnosis not present

## 2023-05-03 DIAGNOSIS — E1129 Type 2 diabetes mellitus with other diabetic kidney complication: Secondary | ICD-10-CM | POA: Diagnosis not present

## 2023-05-03 DIAGNOSIS — Z794 Long term (current) use of insulin: Secondary | ICD-10-CM | POA: Diagnosis not present

## 2023-05-11 ENCOUNTER — Encounter: Payer: Self-pay | Admitting: Oncology

## 2023-05-11 ENCOUNTER — Inpatient Hospital Stay (HOSPITAL_BASED_OUTPATIENT_CLINIC_OR_DEPARTMENT_OTHER): Payer: Medicare Other | Admitting: Oncology

## 2023-05-11 ENCOUNTER — Inpatient Hospital Stay: Payer: Medicare Other | Attending: Oncology

## 2023-05-11 ENCOUNTER — Inpatient Hospital Stay: Payer: Medicare Other

## 2023-05-11 VITALS — BP 170/77 | HR 76 | Temp 96.8°F | Resp 18

## 2023-05-11 VITALS — BP 172/77 | HR 60 | Temp 96.7°F | Resp 18 | Ht 66.0 in | Wt 180.9 lb

## 2023-05-11 DIAGNOSIS — C3412 Malignant neoplasm of upper lobe, left bronchus or lung: Secondary | ICD-10-CM | POA: Insufficient documentation

## 2023-05-11 DIAGNOSIS — Z87891 Personal history of nicotine dependence: Secondary | ICD-10-CM | POA: Insufficient documentation

## 2023-05-11 DIAGNOSIS — Z79899 Other long term (current) drug therapy: Secondary | ICD-10-CM | POA: Diagnosis not present

## 2023-05-11 DIAGNOSIS — Z79620 Long term (current) use of immunosuppressive biologic: Secondary | ICD-10-CM | POA: Diagnosis not present

## 2023-05-11 DIAGNOSIS — Z5112 Encounter for antineoplastic immunotherapy: Secondary | ICD-10-CM | POA: Insufficient documentation

## 2023-05-11 DIAGNOSIS — C3490 Malignant neoplasm of unspecified part of unspecified bronchus or lung: Secondary | ICD-10-CM

## 2023-05-11 DIAGNOSIS — E079 Disorder of thyroid, unspecified: Secondary | ICD-10-CM

## 2023-05-11 LAB — CBC WITH DIFFERENTIAL (CANCER CENTER ONLY)
Abs Immature Granulocytes: 0.04 10*3/uL (ref 0.00–0.07)
Basophils Absolute: 0.1 10*3/uL (ref 0.0–0.1)
Basophils Relative: 1 %
Eosinophils Absolute: 0.3 10*3/uL (ref 0.0–0.5)
Eosinophils Relative: 5 %
HCT: 34.7 % — ABNORMAL LOW (ref 39.0–52.0)
Hemoglobin: 11.3 g/dL — ABNORMAL LOW (ref 13.0–17.0)
Immature Granulocytes: 1 %
Lymphocytes Relative: 12 %
Lymphs Abs: 0.8 10*3/uL (ref 0.7–4.0)
MCH: 28.4 pg (ref 26.0–34.0)
MCHC: 32.6 g/dL (ref 30.0–36.0)
MCV: 87.2 fL (ref 80.0–100.0)
Monocytes Absolute: 0.5 10*3/uL (ref 0.1–1.0)
Monocytes Relative: 7 %
Neutro Abs: 5 10*3/uL (ref 1.7–7.7)
Neutrophils Relative %: 74 %
Platelet Count: 255 10*3/uL (ref 150–400)
RBC: 3.98 MIL/uL — ABNORMAL LOW (ref 4.22–5.81)
RDW: 13.8 % (ref 11.5–15.5)
WBC Count: 6.8 10*3/uL (ref 4.0–10.5)
nRBC: 0 % (ref 0.0–0.2)

## 2023-05-11 LAB — CMP (CANCER CENTER ONLY)
ALT: 11 U/L (ref 0–44)
AST: 16 U/L (ref 15–41)
Albumin: 3.5 g/dL (ref 3.5–5.0)
Alkaline Phosphatase: 71 U/L (ref 38–126)
Anion gap: 9 (ref 5–15)
BUN: 17 mg/dL (ref 8–23)
CO2: 22 mmol/L (ref 22–32)
Calcium: 8.7 mg/dL — ABNORMAL LOW (ref 8.9–10.3)
Chloride: 107 mmol/L (ref 98–111)
Creatinine: 0.88 mg/dL (ref 0.61–1.24)
GFR, Estimated: >60 mL/min (ref >60)
Glucose, Bld: 257 mg/dL — ABNORMAL HIGH (ref 70–99)
Potassium: 4.3 mmol/L (ref 3.5–5.1)
Sodium: 138 mmol/L (ref 135–145)
Total Bilirubin: 0.5 mg/dL (ref 0.0–1.2)
Total Protein: 6.4 g/dL — ABNORMAL LOW (ref 6.5–8.1)

## 2023-05-11 LAB — T4, FREE: Free T4: 1.03 ng/dL (ref 0.61–1.12)

## 2023-05-11 LAB — TSH: TSH: 2.116 u[IU]/mL (ref 0.350–4.500)

## 2023-05-11 MED ORDER — SODIUM CHLORIDE 0.9 % IV SOLN
Freq: Once | INTRAVENOUS | Status: AC
  Filled 2023-05-11: qty 250

## 2023-05-11 MED ORDER — SODIUM CHLORIDE 0.9% FLUSH
10.0000 mL | INTRAVENOUS | Status: DC | PRN
  Administered 2023-05-11: 10 mL
  Filled 2023-05-11: qty 10

## 2023-05-11 MED ORDER — HEPARIN SOD (PORK) LOCK FLUSH 100 UNIT/ML IV SOLN
250.0000 [IU] | Freq: Once | INTRAVENOUS | Status: AC | PRN
  Administered 2023-05-11: 250 [IU]
  Filled 2023-05-11: qty 5

## 2023-05-11 MED ORDER — SODIUM CHLORIDE 0.9 % IV SOLN
1500.0000 mg | Freq: Once | INTRAVENOUS | Status: AC
  Administered 2023-05-11: 1500 mg via INTRAVENOUS
  Filled 2023-05-11: qty 30

## 2023-05-11 NOTE — Progress Notes (Signed)
 Hematology/Oncology Consult note [facility]  [phone] [phone]  Patient Care Team: Doreene Nest, NP as PCP - General (Internal Medicine) Debbe Odea, MD as PCP - Cardiology (Cardiology) Duke Salvia, MD as PCP - Electrophysiology (Cardiology) Harle Battiest, PA-C as Physician Assistant (Urology) Meryl Dare, MD (Inactive) as Consulting Physician (Gastroenterology) Kathyrn Sheriff, [facility] (Inactive) as Pharmacist (Pharmacist) Glory Buff, RN as Oncology Nurse Navigator Creig Hines, MD as Consulting Physician (Oncology) Galen Manila, MD as Referring Physician (Ophthalmology) Creig Hines, MD as Consulting Physician (Oncology)   Name of the patient: Antonio [name]  [id number]  03/24/1945   Date of visit: 05/11/23  Diagnosis- locally recurrent adenocarcinoma of the lung     Chief complaint/ Reason for visit-on treatment assessment prior to cycle 9 of maintenance durvalumab  Heme/Onc history:  patient is a 77 year old male who was diagnosed with stage I non-small cell lung cancer involving the left upper lobe about 1 year ago and he received SBRT for the same. He has been undergoing surveillance CT scans since then. CT chest in April 2024 showed increasing areas of disease with left-sided mediastinal and hilar lymph nodes and soft tissue along the left main bronchus. 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 No residual focal hypermetabolism in the treated area of the left upper lobe. No evidence of distant metastatic disease. He underwent MRI brain without contrast in February 2024 which did not show any evidence of metastatic disease. Patient was seen by Dr. Jayme Cloud and underwent EBUS guided biopsies left paratracheal lymph node was biopsied and consistent with pulmonary adenocarcinoma.  Patient completed  concurrent chemoradiation with weekly CarboTaxol chemotherapy in July 2024.  Not enough tissue available for NGS testing.  Circulogene assay was sent by pulmonary and peripheral blood NGS testing did not show any evidence of EGFR or ALK mutation    Patient started on maintenance durvalumab in August 2024    Interval history-patient states that his blood pressure and blood sugar has been well-controlled based on his home readings.  He has been compliant with his medications.  Denies any specific complaints at this time other than mild intermittent chronic diarrhea which is overall self-limited  ECOG PS- 1 Pain scale- 0   Review of systems- Review of Systems  Constitutional:  Negative for chills, fever, malaise/fatigue and weight loss.  HENT:  Negative for congestion, ear discharge and nosebleeds.   Eyes:  Negative for blurred vision.  Respiratory:  Negative for cough, hemoptysis, sputum production, shortness of breath and wheezing.   Cardiovascular:  Negative for chest pain, palpitations, orthopnea and claudication.  Gastrointestinal:  Negative for abdominal pain, blood in stool, constipation, diarrhea, heartburn, melena, nausea and vomiting.  Genitourinary:  Negative for dysuria, flank pain, frequency, hematuria and urgency.  Musculoskeletal:  Negative for back pain, joint pain and myalgias.  Skin:  Negative for rash.  Neurological:  Negative for dizziness, tingling, focal weakness, seizures, weakness and headaches.  Endo/Heme/Allergies:  Does not bruise/bleed easily.  Psychiatric/Behavioral:  Negative for depression and suicidal ideas. The patient does not have insomnia.       No Known Allergies   Past Medical History:  Diagnosis Date   Adenoma of left adrenal gland    Anemia    Aortic atherosclerosis (HCC)    Arthritis    Atrial fibrillation and flutter (HCC)    a.) CHA2DS2VASc =  7 (age x 2, HTN, CVA x 2, vascular disease history, T2DM);  b.) rate/rhythm maintained without  pharmacological intervention; chronic antithrombotic therapy using ASA + clopidogrel   Bilateral carotid artery stenosis    a.) s/p PTA 04/07/2022 --> 90% RICA --> 9x7x30 Exact stent; b.) doppler 03/14/20214: 1-39% RICA, 40-59% LICA   BPH with urinary obstruction    stable on flomax ([name]   Childhood asthma    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   a.) CT head and MRI brain 04/05/2022: chronic cortical/subcortical RIGHT MCA territory infarct within the RIGHT parietal lobe and RIGHT temporoparietal junction   Coronary atherosclerosis of unspecified type of vessel, native or graft    a.) cCTA 05/22/2021: Ca score 1524 (84th percentile for age/sex match control) --> FFRct: dLAD = 0.77, dLCx = 0.67, RPLA = 0.77, dOM1 = 0.66; b.) LHC 05/23/2021: 20% dRCA, 50% OM1 - med mgmt   COVID-19 virus infection 12/07/2022   Diastolic dysfunction    a.) TTE 04/06/2022: EF 60-65%, mod LVH, mild MR, AoV sclerosis, G1DD   DOE (dyspnea on exertion)    Emphysema of lung (HCC)    Erectile dysfunction    a.) on PDE5i (sildenafil)   Esophageal stricture    Fall    Gastric AVM    Gastritis    GERD (gastroesophageal reflux disease)    Hematoma 05/22/2022   Hiatal hernia    Hx of colonic polyps    Hyperlipidemia    Hypertension    Internal hemorrhoids without mention of complication    Ischemic leg    Long term current use of antithrombotics/antiplatelets    a.) on DAPT (ASA + clopidogrel)   Lumbago    Mobitz (type) I (Wenckebach's) atrioventricular block    Non-small cell lung cancer (HCC)    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   a.) PCR (+) 05/29/2018, 04/07/2022   Pain in limb 12/11/2017   Pneumonia    PSA elevation    now averaging 2's   PVD (peripheral vascular disease) with claudication (HCC)    Sigmoid diverticulosis    Tobacco abuse    Type 2 diabetes mellitus treated with insulin [facility])      Past Surgical History:  Procedure Laterality Date   2D Echo  07/2001   BACK  SURGERY     CAROTID PTA/STENT INTERVENTION Right 04/07/2022   Procedure: CAROTID PTA/STENT INTERVENTION;  Surgeon: Renford Dills, MD;  Location: [facility] INVASIVE CV LAB;  Service: Cardiovascular;  Laterality: Right;   COLONOSCOPY     ENDOBRONCHIAL ULTRASOUND Left 06/19/2022   Procedure: ENDOBRONCHIAL ULTRASOUND;  Surgeon: Salena Saner, MD;  Location: [facility] ORS;  Service: Pulmonary;  Laterality: Left;   ESOPHAGOGASTRODUODENOSCOPY  02/13/2007   gastritis and duodenitis without bleed   ESOPHAGOGASTRODUODENOSCOPY N/A 06/26/2014   Procedure: ESOPHAGOGASTRODUODENOSCOPY (EGD);  Surgeon: Rachael Fee, MD;  Location: [facility] ENDOSCOPY;  Service: Endoscopy;  Laterality: N/A;   ESOPHAGOGASTRODUODENOSCOPY (EGD) WITH PROPOFOL N/A 04/08/2020   Procedure: ESOPHAGOGASTRODUODENOSCOPY (EGD) WITH PROPOFOL;  Surgeon: Meryl Dare, MD;  Location: [facility] ENDOSCOPY;  Service: Endoscopy;  Laterality: N/A;   FLEXIBLE BRONCHOSCOPY Left 06/19/2022   Procedure: FLEXIBLE BRONCHOSCOPY;  Surgeon: Salena Saner, MD;  Location: [facility] ORS;  Service: Pulmonary;  Laterality: Left;   HOT HEMOSTASIS N/A 04/08/2020   Procedure: HOT HEMOSTASIS (ARGON PLASMA COAGULATION/BICAP);  Surgeon: Meryl Dare, MD;  Location: [facility] ENDOSCOPY;  Service: Endoscopy;  Laterality: N/A;   IR IMAGING GUIDED PORT INSERTION  06/30/2022   KNEE ARTHROSCOPY Right 11/1998  LEFT HEART CATH AND CORONARY ANGIOGRAPHY N/A 05/23/2021   Procedure: LEFT HEART CATH AND CORONARY ANGIOGRAPHY;  Surgeon: Orbie Pyo, MD;  Location: [facility] INVASIVE CV LAB;  Service: Cardiovascular;  Laterality: N/A;   LOWER EXTREMITY ANGIOGRAPHY Right 12/27/2017   Procedure: LOWER EXTREMITY ANGIOGRAPHY;  Surgeon: Annice Needy, MD;  Location: [facility] INVASIVE CV LAB;  Service: Cardiovascular;  Laterality: Right;   LOWER EXTREMITY ANGIOGRAPHY Left 01/10/2018   Procedure: LOWER EXTREMITY ANGIOGRAPHY;  Surgeon: Annice Needy, MD;  Location: [facility] INVASIVE CV LAB;  Service:  Cardiovascular;  Laterality: Left;   LOWER EXTREMITY ANGIOGRAPHY Left 05/29/2018   Procedure: LOWER EXTREMITY ANGIOGRAPHY;  Surgeon: Annice Needy, MD;  Location: [facility] INVASIVE CV LAB;  Service: Cardiovascular;  Laterality: Left;   LOWER EXTREMITY ANGIOGRAPHY Left 05/30/2018   Procedure: Lower Extremity Angiography;  Surgeon: Annice Needy, MD;  Location: [facility] INVASIVE CV LAB;  Service: Cardiovascular;  Laterality: Left;   LP  07/2001   Microwave thermotherapy prostate  08/28/2007   [place]   Persantine cardiolite  04/1999   EF 68%   POLYPECTOMY  12/1993   Stress myoview  08/2004   EF 67%   UPPER GASTROINTESTINAL ENDOSCOPY      Social History   Socioeconomic History   Marital status: Widowed    Spouse name: Armando Reichert   Number of children: 3   Years of education: Not on file   Highest education level: Not on file  Occupational History   Occupation: Retired Music therapist  Tobacco Use   Smoking status: Former    Current packs/day: 0.00    Average packs/day: 2.0 packs/day for 57.3 years (114.5 ttl pk-yrs)    Types: Cigarettes    Start date: 02/07/1964    Quit date: 05/11/2021    Years since quitting: 2.0   Smokeless tobacco: Former    Types: Chew   Tobacco comments:    Quit in 2023- khj  Vaping Use   Vaping status: Never Used  Substance and Sexual Activity   Alcohol use: Yes    Comment: rare   Drug use: No   Sexual activity: Yes  Other Topics Concern   Not on file  Social History Narrative   Married 9/09 wife with moderate memory problems.   3 adult children, 2 grandchildren   Would desire CPR   Social Drivers of Health   Financial Resource Strain: Low Risk  (01/25/2023)   Overall Financial Resource Strain (CARDIA)    Difficulty of Paying Living Expenses: Not very hard  Food Insecurity: No Food Insecurity (01/25/2023)   Hunger Vital Sign    Worried About Running Out of Food in the Last Year: Never true    Ran Out of Food in the Last Year: Never true  Transportation Needs: No  Transportation Needs (01/25/2023)   PRAPARE - Administrator, Civil Service (Medical): No    Lack of Transportation (Non-Medical): No  Physical Activity: Inactive (01/25/2023)   Exercise Vital Sign    Days of Exercise per Week: 0 days    Minutes of Exercise per Session: 0 min  Stress: No Stress Concern Present (01/25/2023)   [employer] of Occupational Health - Occupational Stress Questionnaire    Feeling of Stress : Only a little  Social Connections: Moderately Isolated (01/25/2023)   Social Connection and Isolation Panel [NHANES]    Frequency of Communication with Friends and Family: Twice a week    Frequency of Social Gatherings with Friends and Family: Once a week  Attends Religious Services: More than 4 times per year    Active Member of Clubs or Organizations: No    Attends Banker Meetings: Never    Marital Status: Widowed  Intimate Partner Violence: Not At Risk (01/25/2023)   Humiliation, Afraid, Rape, and Kick questionnaire    Fear of Current or Ex-Partner: No    Emotionally Abused: No    Physically Abused: No    Sexually Abused: No    Family History  Problem Relation Age of Onset   Rheum arthritis Mother    Diabetes Father    Alcohol abuse Paternal Uncle    Alcohol abuse Paternal Uncle    Alcohol abuse Paternal Uncle    Heart disease Neg Hx    Stroke Neg Hx    Cancer Neg Hx    Drug abuse Neg Hx    Depression Neg Hx    Colon cancer Neg Hx    Rectal cancer Neg Hx    Stomach cancer Neg Hx    Kidney cancer Neg Hx    Bladder Cancer Neg Hx    Prostate cancer Neg Hx      Current Outpatient Medications:    acetaminophen (TYLENOL) 500 MG tablet, Take 1,000 mg by mouth every 6 (six) hours. Rapid release, Disp: , Rfl:    albuterol (VENTOLIN HFA) 108 (90 Base) MCG/ACT inhaler, Inhale 2 puffs into the lungs every 6 (six) hours as needed for wheezing or shortness of breath., Disp: 8 g, Rfl: 2   atorvastatin (LIPITOR) 20 MG tablet, TAKE 1  TABLET BY MOUTH IN THE EVENING FOR CHOLESTEROL, Disp: 90 tablet, Rfl: 2   clopidogrel (PLAVIX) 75 MG tablet, TAKE 1 TABLET BY MOUTH EVERY DAY, Disp: 90 tablet, Rfl: 3   ferrous gluconate (FERGON) 324 MG tablet, TAKE 1 TABLET BY MOUTH EVERY DAY WITH BREAKFAST, Disp: 30 tablet, Rfl: 0   FIASP FLEXTOUCH 100 UNIT/ML FlexTouch Pen, Inject 10 units before lunch meal each day., Disp: , Rfl:    FIBER PO, Take 1 capsule by mouth in the morning., Disp: , Rfl:    finasteride (PROSCAR) 5 MG tablet, TAKE 1 TABLET (5 MG TOTAL) BY MOUTH DAILY., Disp: 90 tablet, Rfl: 0   Fluticasone-Umeclidin-Vilant (TRELEGY ELLIPTA) 100-62.5-25 MCG/ACT AEPB, Inhale 1 puff into the lungs daily., Disp: 28 each, Rfl: 11   glipiZIDE (GLUCOTROL) 10 MG tablet, Take 10 mg by mouth daily before breakfast., Disp: , Rfl:    GLUCOSAMINE HCL PO, Take 1 tablet by mouth daily., Disp: , Rfl:    insulin aspart (NOVOLOG) 100 UNIT/ML injection, Inject 10 Units into the skin once., Disp: , Rfl:    Insulin Glargine (BASAGLAR KWIKPEN) 100 UNIT/ML, Inject 30 Units into the skin every morning., Disp: , Rfl:    insulin glargine-yfgn (SEMGLEE) 100 UNIT/ML Pen, SMARTSIG:30 Unit(s) SUB-Q Daily, Disp: , Rfl:    lisinopril (ZESTRIL) 30 MG tablet, TAKE 1 TABLET (30 MG TOTAL) BY MOUTH DAILY. FOR BLOOD PRESSURE., Disp: 90 tablet, Rfl: 2   metFORMIN (GLUCOPHAGE) 500 MG tablet, TAKE 2 TABLETS BY MOUTH TWICE A DAY WITH MEAL FOR DIABETES (Patient taking differently: Take 1,000 mg by mouth 2 (two) times daily. TAKE 2 TABLETS BY MOUTH TWICE A DAY WITH MEAL FOR DIABETES), Disp: 360 tablet, Rfl: 2   pantoprazole (PROTONIX) 40 MG tablet, TAKE 1 TABLET (40 MG TOTAL) BY MOUTH DAILY. FOR HEARTBURN, Disp: 90 tablet, Rfl: 2   sildenafil (VIAGRA) 100 MG tablet, Take 1 tablet (100 mg total) by mouth daily as needed  for erectile dysfunction., Disp: 30 tablet, Rfl: 0   tamsulosin (FLOMAX) 0.4 MG CAPS capsule, TAKE 1 CAPSULE BY MOUTH 2 TIMES DAILY., Disp: 180 capsule, Rfl: 3    vitamin B-12 (CYANOCOBALAMIN) 1000 MCG tablet, Take 1,000 mcg by mouth in the morning., Disp: , Rfl:  No current facility-administered medications for this visit.  Facility-Administered Medications Ordered in Other Visits:    durvalumab (IMFINZI) 1,500 mg in sodium chloride 0.9 % 100 mL chemo infusion, 1,500 mg, Intravenous, Once, Creig Hines, MD, Last Rate: 130 mL/hr at 05/11/23 1051, 1,500 mg at 05/11/23 1051   heparin lock flush 100 unit/mL, 250 Units, Intracatheter, Once PRN, Creig Hines, MD   sodium chloride flush (NS) 0.9 % injection 10 mL, 10 mL, Intracatheter, PRN, Creig Hines, MD, 10 mL at 01/19/23 1205   sodium chloride flush (NS) 0.9 % injection 10 mL, 10 mL, Intracatheter, PRN, Creig Hines, MD, 10 mL at 05/11/23 0945  Physical exam:  Vitals:   05/11/23 0844  BP: (!) 172/77  Pulse: 60  Resp: 18  Temp: (!) 96.7 F (35.9 C)  TempSrc: Tympanic  SpO2: 97%  Weight: 180 lb 14.4 oz (82.1 kg)  Height: 5\' 6"  (1.676 m)   Physical Exam Cardiovascular:     Rate and Rhythm: Normal rate and regular rhythm.     Heart sounds: Normal heart sounds.  Pulmonary:     Effort: Pulmonary effort is normal.     Breath sounds: Normal breath sounds.  Skin:    General: Skin is warm and dry.  Neurological:     Mental Status: He is alert and oriented to person, place, and time.      I have personally reviewed labs listed below:    Latest Ref Rng & Units 05/11/2023    8:26 AM  CMP  Glucose 70 - 99 mg/dL 161   BUN 8 - 23 mg/dL 17   Creatinine 0.96 - 1.24 mg/dL 0.45   Sodium 409 - 811 mmol/L 138   Potassium 3.5 - 5.1 mmol/L 4.3   Chloride 98 - 111 mmol/L 107   CO2 22 - 32 mmol/L 22   Calcium 8.9 - 10.3 mg/dL 8.7   Total Protein 6.5 - 8.1 g/dL 6.4   Total Bilirubin 0.0 - 1.2 mg/dL 0.5   Alkaline Phos 38 - 126 U/L 71   AST 15 - 41 U/L 16   ALT 0 - 44 U/L 11       Latest Ref Rng & Units 05/11/2023    8:26 AM  CBC  WBC 4.0 - 10.5 K/uL 6.8   Hemoglobin 13.0 - 17.0 g/dL 91.4    Hematocrit 78.2 - 52.0 % 34.7   Platelets 150 - 400 K/uL 255      Assessment and plan- Patient is a 78 y.o. male with history of locally recurrent stage III non-small cell lung cancer here for on treatment assessment prior to cycle 9 of maintenance durvalumab  Counts okay to proceed with cycle 9 of maintenance durvalumab today.  I will see him back in 4 weeks for cycle 10.  Plan is to stop durvalumab after 12 cycles and he will continue to get surveillance scans every 3 to 6 months.   Visit Diagnosis 1. Recurrent non-small cell lung cancer (HCC)   2. Encounter for antineoplastic immunotherapy      Dr. Owens Shark, MD, MPH [facility] at [facility] [phone] 05/11/2023 11:26 AM

## 2023-05-11 NOTE — Patient Instructions (Signed)

## 2023-05-11 NOTE — Patient Instructions (Signed)
 [facility] - A DEPT OF [facility]  Discharge Instructions: Thank you for choosing [facility] to provide your oncology and hematology care.  If you have a lab appointment with the Cancer Center, please go directly to the Cancer Center and check in at the registration area.  Wear comfortable clothing and clothing appropriate for easy access to any Portacath or PICC line.   We strive to give you quality time with your provider. You may need to reschedule your appointment if you arrive late (15 or more minutes).  Arriving late affects you and other patients whose appointments are after yours.  Also, if you miss three or more appointments without notifying the office, you may be dismissed from the clinic at the provider's discretion.      For prescription refill requests, have your pharmacy contact our office and allow 72 hours for refills to be completed.    Today you received the following chemotherapy and/or immunotherapy agents Durvalumab      To help prevent nausea and vomiting after your treatment, we encourage you to take your nausea medication as directed.  BELOW ARE SYMPTOMS THAT SHOULD BE REPORTED IMMEDIATELY: *FEVER GREATER THAN 100.4 F (38 C) OR HIGHER *CHILLS OR SWEATING *NAUSEA AND VOMITING THAT IS NOT CONTROLLED WITH YOUR NAUSEA MEDICATION *UNUSUAL SHORTNESS OF BREATH *UNUSUAL BRUISING OR BLEEDING *URINARY PROBLEMS (pain or burning when urinating, or frequent urination) *BOWEL PROBLEMS (unusual diarrhea, constipation, pain near the anus) TENDERNESS IN MOUTH AND THROAT WITH OR WITHOUT PRESENCE OF ULCERS (sore throat, sores in mouth, or a toothache) UNUSUAL RASH, SWELLING OR PAIN  UNUSUAL VAGINAL DISCHARGE OR ITCHING   Items with * indicate a potential emergency and should be followed up as soon as possible or go to the Emergency Department if any problems should occur.  Please show the CHEMOTHERAPY ALERT CARD or IMMUNOTHERAPY  ALERT CARD at check-in to the Emergency Department and triage nurse.  Should you have questions after your visit or need to cancel or reschedule your appointment, please contact [facility] - A DEPT OF [facility]  [phone] and follow the prompts.  Office hours are 8:00 a.m. to 4:30 p.m. Monday - Friday. Please note that voicemails left after 4:00 p.m. may not be returned until the following business day.  We are closed weekends and major holidays. You have access to a nurse at all times for urgent questions. Please call the main number to the clinic [phone] and follow the prompts.  For any non-urgent questions, you may also contact your provider using MyChart. We now offer e-Visits for anyone 36 and older to request care online for non-urgent symptoms. For details visit [email or web address].   Also download the MyChart app! Go to the app store, search "MyChart", open the app, select [place], and log in with your MyChart username and password.

## 2023-05-12 LAB — T4: T4, Total: 8.7 ug/dL (ref 4.5–12.0)

## 2023-05-21 ENCOUNTER — Encounter: Payer: Self-pay | Admitting: Podiatry

## 2023-05-21 ENCOUNTER — Ambulatory Visit (INDEPENDENT_AMBULATORY_CARE_PROVIDER_SITE_OTHER): Payer: Medicare Other | Admitting: Podiatry

## 2023-05-21 VITALS — Ht 66.0 in | Wt 180.9 lb

## 2023-05-21 DIAGNOSIS — M79674 Pain in right toe(s): Secondary | ICD-10-CM | POA: Diagnosis not present

## 2023-05-21 DIAGNOSIS — E1151 Type 2 diabetes mellitus with diabetic peripheral angiopathy without gangrene: Secondary | ICD-10-CM | POA: Diagnosis not present

## 2023-05-21 DIAGNOSIS — M2141 Flat foot [pes planus] (acquired), right foot: Secondary | ICD-10-CM | POA: Diagnosis not present

## 2023-05-21 DIAGNOSIS — M79675 Pain in left toe(s): Secondary | ICD-10-CM | POA: Diagnosis not present

## 2023-05-21 DIAGNOSIS — M2142 Flat foot [pes planus] (acquired), left foot: Secondary | ICD-10-CM | POA: Diagnosis not present

## 2023-05-21 DIAGNOSIS — M205X1 Other deformities of toe(s) (acquired), right foot: Secondary | ICD-10-CM | POA: Diagnosis not present

## 2023-05-21 DIAGNOSIS — B351 Tinea unguium: Secondary | ICD-10-CM | POA: Diagnosis not present

## 2023-05-21 DIAGNOSIS — M205X2 Other deformities of toe(s) (acquired), left foot: Secondary | ICD-10-CM

## 2023-05-21 DIAGNOSIS — E119 Type 2 diabetes mellitus without complications: Secondary | ICD-10-CM | POA: Diagnosis not present

## 2023-05-24 NOTE — Progress Notes (Signed)
 ANNUAL DIABETIC FOOT EXAM  Subjective: Antonio [name] presents today for annual diabetic foot exam.  Chief Complaint  Patient presents with   Nail Problem    Pt is here for [facility] last A1C was under 8 PCP is Dr Chestine Spore and LOV was in November.   Patient confirms h/o diabetes.  Patient has been diagnosed with neuropathy and PAD.  Doreene Nest, NP is patient's PCP.  Past Medical History:  Diagnosis Date   Adenoma of left adrenal gland    Anemia    Aortic atherosclerosis (HCC)    Arthritis    Atrial fibrillation and flutter (HCC)    a.) CHA2DS2VASc = 7 (age x 2, HTN, CVA x 2, vascular disease history, T2DM);  b.) rate/rhythm maintained without pharmacological intervention; chronic antithrombotic therapy using ASA + clopidogrel   Bilateral carotid artery stenosis    a.) s/p PTA 04/07/2022 --> 90% RICA --> 9x7x30 Exact stent; b.) doppler 03/14/20214: 1-39% RICA, 40-59% LICA   BPH with urinary obstruction    stable on flomax ([name]   Childhood asthma    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   a.) CT head and MRI brain 04/05/2022: chronic cortical/subcortical RIGHT MCA territory infarct within the RIGHT parietal lobe and RIGHT temporoparietal junction   Coronary atherosclerosis of unspecified type of vessel, native or graft    a.) cCTA 05/22/2021: Ca score 1524 (84th percentile for age/sex match control) --> FFRct: dLAD = 0.77, dLCx = 0.67, RPLA = 0.77, dOM1 = 0.66; b.) LHC 05/23/2021: 20% dRCA, 50% OM1 - med mgmt   COVID-19 virus infection 12/07/2022   Diastolic dysfunction    a.) TTE 04/06/2022: EF 60-65%, mod LVH, mild MR, AoV sclerosis, G1DD   DOE (dyspnea on exertion)    Emphysema of lung (HCC)    Erectile dysfunction    a.) on PDE5i (sildenafil)   Esophageal stricture    Fall    Gastric AVM    Gastritis    GERD (gastroesophageal reflux disease)    Hematoma 05/22/2022   Hiatal hernia    Hx of colonic polyps    Hyperlipidemia    Hypertension    Internal  hemorrhoids without mention of complication    Ischemic leg    Long term current use of antithrombotics/antiplatelets    a.) on DAPT (ASA + clopidogrel)   Lumbago    Mobitz (type) I (Wenckebach's) atrioventricular block    Non-small cell lung cancer (HCC)    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   a.) PCR (+) 05/29/2018, 04/07/2022   Pain in limb 12/11/2017   Pneumonia    PSA elevation    now averaging 2's   PVD (peripheral vascular disease) with claudication (HCC)    Sigmoid diverticulosis    Tobacco abuse    Type 2 diabetes mellitus treated with insulin [facility])    Patient Active Problem List   Diagnosis Date Noted   Orthostatic hypotension 05/01/2023   Bradycardia 05/01/2023   Second degree AV block, Mobitz type I 05/01/2023   COPD (chronic obstructive pulmonary disease) (HCC) 12/07/2022   Recurrent non-small cell lung cancer (HCC) 06/30/2022   Mediastinal adenopathy 06/19/2022   Bilateral carotid artery stenosis 04/06/2022   Encephalopathy 04/05/2022   History of CVA (cerebrovascular accident) 04/05/2022   Fatigue 12/21/2021   Hearing loss of right ear due to cerumen impaction 10/05/2021   Non-small cell lung cancer (HCC) 06/19/2021   Aortic atherosclerosis (HCC) 06/19/2021   Atrioventricular block, Mobitz type 1, Wenckebach 05/25/2021   Complete  heart block (HCC) 05/20/2021   Gastric AVM    Iron deficiency anemia    Rash and nonspecific skin eruption 12/19/2019   Chronic pain of right hip 10/17/2019   Ischemic leg 05/29/2018   B12 deficiency 04/03/2018   PAD (peripheral artery disease) (HCC) 12/12/2017   GERD (gastroesophageal reflux disease) 04/30/2017   BPH with obstruction/lower urinary tract symptoms 10/14/2014   Essential hypertension 06/26/2014   Type 2 diabetes mellitus with hyperglycemia (HCC) 06/26/2014   HLD (hyperlipidemia) 06/27/2006   Coronary atherosclerosis 06/27/2006   Tobacco abuse 06/27/2006   Past Surgical History:  Procedure Laterality Date   2D  Echo  07/2001   BACK SURGERY     CAROTID PTA/STENT INTERVENTION Right 04/07/2022   Procedure: CAROTID PTA/STENT INTERVENTION;  Surgeon: Jackquelyn Mass, MD;  Location: [facility] INVASIVE CV [facility];  Service: Cardiovascular;  Laterality: Right;   COLONOSCOPY     ENDOBRONCHIAL ULTRASOUND Left 06/19/2022   Procedure: ENDOBRONCHIAL ULTRASOUND;  Surgeon: Marc Senior, MD;  Location: [facility] ORS;  Service: Pulmonary;  Laterality: Left;   ESOPHAGOGASTRODUODENOSCOPY  02/13/2007   gastritis and duodenitis without bleed   ESOPHAGOGASTRODUODENOSCOPY N/A 06/26/2014   Procedure: ESOPHAGOGASTRODUODENOSCOPY (EGD);  Surgeon: Janel Medford, MD;  Location: [facility];  Service: Endoscopy;  Laterality: N/A;   ESOPHAGOGASTRODUODENOSCOPY (EGD) WITH PROPOFOL N/A 04/08/2020   Procedure: ESOPHAGOGASTRODUODENOSCOPY (EGD) WITH PROPOFOL;  Surgeon: Asencion Blacksmith, MD;  Location: [facility];  Service: Endoscopy;  Laterality: N/A;   FLEXIBLE BRONCHOSCOPY Left 06/19/2022   Procedure: FLEXIBLE BRONCHOSCOPY;  Surgeon: Marc Senior, MD;  Location: [facility] ORS;  Service: Pulmonary;  Laterality: Left;   HOT HEMOSTASIS N/A 04/08/2020   Procedure: HOT HEMOSTASIS (ARGON PLASMA COAGULATION/BICAP);  Surgeon: Asencion Blacksmith, MD;  Location: [facility];  Service: Endoscopy;  Laterality: N/A;   IR IMAGING GUIDED PORT INSERTION  06/30/2022   KNEE ARTHROSCOPY Right 11/1998   LEFT HEART CATH AND CORONARY ANGIOGRAPHY N/A 05/23/2021   Procedure: LEFT HEART CATH AND CORONARY ANGIOGRAPHY;  Surgeon: Kyra Phy, MD;  Location: [facility];  Service: Cardiovascular;  Laterality: N/A;   LOWER EXTREMITY ANGIOGRAPHY Right 12/27/2017   Procedure: LOWER EXTREMITY ANGIOGRAPHY;  Surgeon: Celso College, MD;  Location: [facility];  Service: Cardiovascular;  Laterality: Right;   LOWER EXTREMITY ANGIOGRAPHY Left 01/10/2018   Procedure: LOWER EXTREMITY ANGIOGRAPHY;  Surgeon: Celso College, MD;  Location: [facility];   Service: Cardiovascular;  Laterality: Left;   LOWER EXTREMITY ANGIOGRAPHY Left 05/29/2018   Procedure: LOWER EXTREMITY ANGIOGRAPHY;  Surgeon: Celso College, MD;  Location: [facility];  Service: Cardiovascular;  Laterality: Left;   LOWER EXTREMITY ANGIOGRAPHY Left 05/30/2018   Procedure: Lower Extremity Angiography;  Surgeon: Celso College, MD;  Location: [facility];  Service: Cardiovascular;  Laterality: Left;   LP  07/2001   Microwave thermotherapy prostate  08/28/2007   [place]   Persantine cardiolite  04/1999   EF 68%   POLYPECTOMY  12/1993   Stress myoview  08/2004   EF 67%   UPPER GASTROINTESTINAL ENDOSCOPY     Current Outpatient Medications on File Prior to Visit  Medication Sig Dispense Refill   acetaminophen (TYLENOL) 500 MG tablet Take 1,000 mg by mouth every 6 (six) hours. Rapid release     albuterol (VENTOLIN HFA) 108 (90 Base) MCG/ACT inhaler Inhale 2 puffs into the lungs every 6 (six) hours as needed for wheezing or shortness of breath. 8 g 2   atorvastatin (LIPITOR)  20 MG tablet TAKE 1 TABLET BY MOUTH IN THE EVENING FOR CHOLESTEROL 90 tablet 2   clopidogrel (PLAVIX) 75 MG tablet TAKE 1 TABLET BY MOUTH EVERY DAY 90 tablet 3   ferrous gluconate (FERGON) 324 MG tablet TAKE 1 TABLET BY MOUTH EVERY DAY WITH BREAKFAST 30 tablet 0   FIASP FLEXTOUCH 100 UNIT/ML FlexTouch Pen Inject 10 units before lunch meal each day.     FIBER PO Take 1 capsule by mouth in the morning.     finasteride (PROSCAR) 5 MG tablet TAKE 1 TABLET (5 MG TOTAL) BY MOUTH DAILY. 90 tablet 0   Fluticasone-Umeclidin-Vilant (TRELEGY ELLIPTA) 100-62.5-25 MCG/ACT AEPB Inhale 1 puff into the lungs daily. 28 each 11   glipiZIDE (GLUCOTROL) 10 MG tablet Take 10 mg by mouth daily before breakfast.     GLUCOSAMINE HCL PO Take 1 tablet by mouth daily.     insulin aspart (NOVOLOG) 100 UNIT/ML injection Inject 10 Units into the skin once.     Insulin Glargine (BASAGLAR KWIKPEN) 100 UNIT/ML Inject 30 Units  into the skin every morning.     insulin glargine-yfgn (SEMGLEE) 100 UNIT/ML Pen SMARTSIG:30 Unit(s) SUB-Q Daily     lisinopril (ZESTRIL) 30 MG tablet TAKE 1 TABLET (30 MG TOTAL) BY MOUTH DAILY. FOR BLOOD PRESSURE. 90 tablet 2   metFORMIN (GLUCOPHAGE) 500 MG tablet TAKE 2 TABLETS BY MOUTH TWICE A DAY WITH MEAL FOR DIABETES (Patient taking differently: Take 1,000 mg by mouth 2 (two) times daily. TAKE 2 TABLETS BY MOUTH TWICE A DAY WITH MEAL FOR DIABETES) 360 tablet 2   pantoprazole (PROTONIX) 40 MG tablet TAKE 1 TABLET (40 MG TOTAL) BY MOUTH DAILY. FOR HEARTBURN 90 tablet 2   sildenafil (VIAGRA) 100 MG tablet Take 1 tablet (100 mg total) by mouth daily as needed for erectile dysfunction. 30 tablet 0   tamsulosin (FLOMAX) 0.4 MG CAPS capsule TAKE 1 CAPSULE BY MOUTH 2 TIMES DAILY. 180 capsule 3   vitamin B-12 (CYANOCOBALAMIN) 1000 MCG tablet Take 1,000 mcg by mouth in the morning.     Current Facility-Administered Medications on File Prior to Visit  Medication Dose Route Frequency Provider Last Rate Last Admin   sodium chloride flush (NS) 0.9 % injection 10 mL  10 mL Intracatheter PRN Avonne Boettcher, MD   10 mL at 01/19/23 1205    No Known Allergies Social History   Occupational History   Occupation: Retired Music therapist  Tobacco Use   Smoking status: Former    Current packs/day: 0.00    Average packs/day: 2.0 packs/day for 57.3 years (114.5 ttl pk-yrs)    Types: Cigarettes    Start date: 02/07/1964    Quit date: 05/11/2021    Years since quitting: 2.0   Smokeless tobacco: Former    Types: Chew   Tobacco comments:    Quit in 2023- khj  Vaping Use   Vaping status: Never Used  Substance and Sexual Activity   Alcohol use: Yes    Comment: rare   Drug use: No   Sexual activity: Yes   Family History  Problem Relation Age of Onset   Rheum arthritis Mother    Diabetes Father    Alcohol abuse Paternal Uncle    Alcohol abuse Paternal Uncle    Alcohol abuse Paternal Uncle    Heart disease Neg  Hx    Stroke Neg Hx    Cancer Neg Hx    Drug abuse Neg Hx    Depression Neg Hx    Colon  cancer Neg Hx    Rectal cancer Neg Hx    Stomach cancer Neg Hx    Kidney cancer Neg Hx    Bladder Cancer Neg Hx    Prostate cancer Neg Hx    Immunization History  Administered Date(s) Administered   Moderna Sars-Covid-2 Vaccination 07/09/2019, 08/06/2019   Pneumococcal Conjugate-13 09/30/2018   Td 10/08/1994, 11/19/2006     Review of Systems: Negative except as noted in the HPI.   Objective: There were no vitals filed for this visit.  Antonio [name] is a pleasant 78 y.o. male in NAD. AAO X 3.  Diabetic foot exam was performed with the following findings:   Vascular Examination: CFT <4 seconds b/l. DP pulses diminished b/l. PT pulses diminished b/l. Digital hair absent. Skin temperature gradient warm to cool b/l. No ischemia or gangrene. No cyanosis or clubbing noted b/l. No edema noted b/l LE.   Neurological Examination: Pt has subjective symptoms of neuropathy. Protective sensation diminished with 10g monofilament b/l.  Dermatological Examination: Pedal skin thin, shiny and atrophic b/l. No open wounds. No interdigital macerations.   Toenails 1-5 b/l thick, discolored, elongated with subungual debris and pain on dorsal palpation.   No corns, calluses nor porokeratotic lesions noted.  Musculoskeletal Examination: Muscle strength 5/5 to all lower extremity muscle groups bilaterally. Webbed toes left 3rd/4th digits. Adductovarus deformity bilateral 5th toes. Pes planus deformity noted bilateral LE.  Radiographs: None     Lab Results  Component Value Date   HGBA1C 8.8 (H) 04/05/2022   ADA Risk Categorization: High Risk  Patient has one or more of the following: Loss of protective sensation Absent pedal pulses Severe Foot deformity History of foot ulcer  Assessment: 1. Pain due to onychomycosis of toenails of both feet   2. 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 Acquired adductovarus rotation of toe of right foot   4. Pes planus of both feet   5. Type II diabetes mellitus with peripheral circulatory disorder (HCC)   6. Encounter for diabetic foot exam (HCC)     Plan: Diabetic foot examination performed today.  All patient's and/or POA's questions/concerns addressed on today's visit. Mycotic toenails 1-5 debrided in length and girth without incident. Continue daily foot inspections and monitor blood glucose per PCP/Endocrinologist's recommendations. Continue soft, supportive shoe gear daily. Report any pedal injuries to medical professional. Call office if there are any questions/concerns. -Patient/POA to call should there be question/concern in the interim. Return in about 3 months (around 08/20/2023).  Luella Sager, DPM      [place] LOCATION: [address], [place] [address]                   Office [phone]   [facility] LOCATION: [address] [place], [place] [address] Office [phone]

## 2023-06-08 ENCOUNTER — Inpatient Hospital Stay

## 2023-06-08 ENCOUNTER — Encounter: Payer: Self-pay | Admitting: Oncology

## 2023-06-08 ENCOUNTER — Inpatient Hospital Stay: Attending: Oncology

## 2023-06-08 ENCOUNTER — Other Ambulatory Visit: Payer: Self-pay

## 2023-06-08 ENCOUNTER — Inpatient Hospital Stay (HOSPITAL_BASED_OUTPATIENT_CLINIC_OR_DEPARTMENT_OTHER): Attending: Oncology | Admitting: Oncology

## 2023-06-08 VITALS — BP 185/69 | HR 48

## 2023-06-08 VITALS — BP 159/81 | HR 54 | Temp 97.0°F | Resp 18 | Ht 66.0 in | Wt 181.4 lb

## 2023-06-08 DIAGNOSIS — C3490 Malignant neoplasm of unspecified part of unspecified bronchus or lung: Secondary | ICD-10-CM

## 2023-06-08 DIAGNOSIS — C3491 Malignant neoplasm of unspecified part of right bronchus or lung: Secondary | ICD-10-CM | POA: Diagnosis not present

## 2023-06-08 DIAGNOSIS — Z5112 Encounter for antineoplastic immunotherapy: Secondary | ICD-10-CM | POA: Insufficient documentation

## 2023-06-08 DIAGNOSIS — C3412 Malignant neoplasm of upper lobe, left bronchus or lung: Secondary | ICD-10-CM | POA: Diagnosis not present

## 2023-06-08 DIAGNOSIS — Z87891 Personal history of nicotine dependence: Secondary | ICD-10-CM | POA: Insufficient documentation

## 2023-06-08 DIAGNOSIS — Z79899 Other long term (current) drug therapy: Secondary | ICD-10-CM | POA: Insufficient documentation

## 2023-06-08 LAB — CMP (CANCER CENTER ONLY)
ALT: 10 U/L (ref 0–44)
AST: 15 U/L (ref 15–41)
Albumin: 3.4 g/dL — ABNORMAL LOW (ref 3.5–5.0)
Alkaline Phosphatase: 61 U/L (ref 38–126)
Anion gap: 8 (ref 5–15)
BUN: 20 mg/dL (ref 8–23)
CO2: 22 mmol/L (ref 22–32)
Calcium: 8 mg/dL — ABNORMAL LOW (ref 8.9–10.3)
Chloride: 109 mmol/L (ref 98–111)
Creatinine: 0.84 mg/dL (ref 0.61–1.24)
GFR, Estimated: >60 mL/min (ref >60)
Glucose, Bld: 283 mg/dL — ABNORMAL HIGH (ref 70–99)
Potassium: 4.4 mmol/L (ref 3.5–5.1)
Sodium: 139 mmol/L (ref 135–145)
Total Bilirubin: 0.3 mg/dL (ref 0.0–1.2)
Total Protein: 6.1 g/dL — ABNORMAL LOW (ref 6.5–8.1)

## 2023-06-08 LAB — CBC WITH DIFFERENTIAL (CANCER CENTER ONLY)
Abs Immature Granulocytes: 0.02 10*3/uL (ref 0.00–0.07)
Basophils Absolute: 0.1 10*3/uL (ref 0.0–0.1)
Basophils Relative: 1 %
Eosinophils Absolute: 0.3 10*3/uL (ref 0.0–0.5)
Eosinophils Relative: 4 %
HCT: 33.5 % — ABNORMAL LOW (ref 39.0–52.0)
Hemoglobin: 10.8 g/dL — ABNORMAL LOW (ref 13.0–17.0)
Immature Granulocytes: 0 %
Lymphocytes Relative: 12 %
Lymphs Abs: 0.8 10*3/uL (ref 0.7–4.0)
MCH: 28.2 pg (ref 26.0–34.0)
MCHC: 32.2 g/dL (ref 30.0–36.0)
MCV: 87.5 fL (ref 80.0–100.0)
Monocytes Absolute: 0.6 10*3/uL (ref 0.1–1.0)
Monocytes Relative: 9 %
Neutro Abs: 5.3 10*3/uL (ref 1.7–7.7)
Neutrophils Relative %: 74 %
Platelet Count: 219 10*3/uL (ref 150–400)
RBC: 3.83 MIL/uL — ABNORMAL LOW (ref 4.22–5.81)
RDW: 14.6 % (ref 11.5–15.5)
WBC Count: 7.1 10*3/uL (ref 4.0–10.5)
nRBC: 0 % (ref 0.0–0.2)

## 2023-06-08 MED ORDER — SODIUM CHLORIDE 0.9 % IV SOLN
1500.0000 mg | Freq: Once | INTRAVENOUS | Status: AC
  Administered 2023-06-08: 1500 mg via INTRAVENOUS
  Filled 2023-06-08: qty 30

## 2023-06-08 MED ORDER — SODIUM CHLORIDE 0.9 % IV SOLN
Freq: Once | INTRAVENOUS | Status: AC
  Filled 2023-06-08: qty 250

## 2023-06-08 MED ORDER — HEPARIN SOD (PORK) LOCK FLUSH 100 UNIT/ML IV SOLN
500.0000 [IU] | Freq: Once | INTRAVENOUS | Status: AC | PRN
  Administered 2023-06-08: 500 [IU]
  Filled 2023-06-08: qty 5

## 2023-06-08 NOTE — Progress Notes (Signed)
 Hematology/Oncology Consult note [facility]  [phone] [phone]  Patient Care Team: Gabriel Donavyn, NP as PCP - General (Internal Medicine) Constancia Delton, MD as PCP - Cardiology (Cardiology) Verona Goodwill, MD as PCP - Electrophysiology (Cardiology) Oda Bence, PA-C as Physician Assistant (Urology) Asencion Blacksmith, MD (Inactive) as Consulting Physician (Gastroenterology) Jonathan Neighbor, [employer] (Inactive) as Pharmacist (Pharmacist) Drake Gens, RN as Oncology Nurse Navigator Avonne Boettcher, MD as Consulting Physician (Oncology) Clair Crews, MD as Referring Physician (Ophthalmology) Avonne Boettcher, MD as Consulting Physician (Oncology)   Name of the patient: Antonio [name]  [id number]  22-Jun-1945   Date of visit: 06/08/23  Diagnosis-locally recurrent adenocarcinoma of the lung  Chief complaint/ Reason for visit-on treatment assessment prior to cycle 10 of maintenance durvalumab   Heme/Onc history: patient is a 78 year old male who was diagnosed with stage I non-small cell lung cancer involving the left upper lobe about 1 year ago and he received SBRT for the same. He has been undergoing surveillance CT scans since then. CT chest in April 2024 showed increasing areas of disease with left-sided mediastinal and hilar lymph nodes and soft tissue along the left main bronchus. 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 No residual focal hypermetabolism in the treated area of the left upper lobe. No evidence of distant metastatic disease. He underwent MRI brain without contrast in February 2024 which did not show any evidence of metastatic disease. Patient was seen by Dr. Viva Grise and underwent EBUS guided biopsies left paratracheal lymph node was biopsied and consistent with pulmonary adenocarcinoma.  Patient completed  concurrent chemoradiation with weekly CarboTaxol chemotherapy in July 2024.  Not enough tissue available for NGS testing.  Circulogene assay was sent by pulmonary and peripheral blood NGS testing did not show any evidence of EGFR or ALK mutation    Patient started on maintenance durvalumab  in August 2024  Interval history- no acute issues since last visit. Mild chronic diarrhea which is stable.   ECOG PS- 1 Pain scale- 0 Opioid associated constipation- no  Review of systems- Review of Systems  Constitutional:  Negative for chills, fever, malaise/fatigue and weight loss.  HENT:  Negative for congestion, ear discharge and nosebleeds.   Eyes:  Negative for blurred vision.  Respiratory:  Negative for cough, hemoptysis, sputum production, shortness of breath and wheezing.   Cardiovascular:  Negative for chest pain, palpitations, orthopnea and claudication.  Gastrointestinal:  Negative for abdominal pain, blood in stool, constipation, diarrhea, heartburn, melena, nausea and vomiting.  Genitourinary:  Negative for dysuria, flank pain, frequency, hematuria and urgency.  Musculoskeletal:  Negative for back pain, joint pain and myalgias.  Skin:  Negative for rash.  Neurological:  Negative for dizziness, tingling, focal weakness, seizures, weakness and headaches.  Endo/Heme/Allergies:  Does not bruise/bleed easily.  Psychiatric/Behavioral:  Negative for depression and suicidal ideas. The patient does not have insomnia.       No Known Allergies   Past Medical History:  Diagnosis Date   Adenoma of left adrenal gland    Anemia    Aortic atherosclerosis (HCC)    Arthritis    Atrial fibrillation and flutter (HCC)    a.) CHA2DS2VASc = 7 (age x 2, HTN, CVA x 2, vascular disease history, T2DM);  b.) rate/rhythm maintained without pharmacological intervention; chronic antithrombotic therapy using ASA + clopidogrel    Bilateral carotid artery stenosis  a.) s/p PTA 04/07/2022 --> 90% RICA --> 9x7x30  Exact stent; b.) doppler 03/14/20214: 1-39% RICA, 40-59% LICA   BPH with urinary obstruction    stable on flomax  ([name]   Childhood asthma    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   a.) CT head and MRI brain 04/05/2022: chronic cortical/subcortical RIGHT MCA territory infarct within the RIGHT parietal lobe and RIGHT temporoparietal junction   Coronary atherosclerosis of unspecified type of vessel, native or graft    a.) cCTA 05/22/2021: Ca score 1524 (84th percentile for age/sex match control) --> FFRct: dLAD = 0.77, dLCx = 0.67, RPLA = 0.77, dOM1 = 0.66; b.) LHC 05/23/2021: 20% dRCA, 50% OM1 - med mgmt   COVID-19 virus infection 12/07/2022   Diastolic dysfunction    a.) TTE 04/06/2022: EF 60-65%, mod LVH, mild MR, AoV sclerosis, G1DD   DOE (dyspnea on exertion)    Emphysema of lung (HCC)    Erectile dysfunction    a.) on PDE5i (sildenafil )   Esophageal stricture    Fall    Gastric AVM    Gastritis    GERD (gastroesophageal reflux disease)    Hematoma 05/22/2022   Hiatal hernia    Hx of colonic polyps    Hyperlipidemia    Hypertension    Internal hemorrhoids without mention of complication    Ischemic leg    Long term current use of antithrombotics/antiplatelets    a.) on DAPT (ASA + clopidogrel )   Lumbago    Mobitz (type) I (Wenckebach's) atrioventricular block    Non-small cell lung cancer (HCC)    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   a.) PCR (+) 05/29/2018, 04/07/2022   Pain in limb 12/11/2017   Pneumonia    PSA elevation    now averaging 2's   PVD (peripheral vascular disease) with claudication (HCC)    Sigmoid diverticulosis    Tobacco abuse    Type 2 diabetes mellitus treated with insulin  [facility])      Past Surgical History:  Procedure Laterality Date   2D Echo  07/2001   BACK SURGERY     CAROTID PTA/STENT INTERVENTION Right 04/07/2022   Procedure: CAROTID PTA/STENT INTERVENTION;  Surgeon: Jackquelyn Mass, MD;  Location: [facility] INVASIVE CV LAB;   Service: Cardiovascular;  Laterality: Right;   COLONOSCOPY     ENDOBRONCHIAL ULTRASOUND Left 06/19/2022   Procedure: ENDOBRONCHIAL ULTRASOUND;  Surgeon: Marc Senior, MD;  Location: [facility] ORS;  Service: Pulmonary;  Laterality: Left;   ESOPHAGOGASTRODUODENOSCOPY  02/13/2007   gastritis and duodenitis without bleed   ESOPHAGOGASTRODUODENOSCOPY N/A 06/26/2014   Procedure: ESOPHAGOGASTRODUODENOSCOPY (EGD);  Surgeon: Janel Medford, MD;  Location: [facility] ENDOSCOPY;  Service: Endoscopy;  Laterality: N/A;   ESOPHAGOGASTRODUODENOSCOPY (EGD) WITH PROPOFOL  N/A 04/08/2020   Procedure: ESOPHAGOGASTRODUODENOSCOPY (EGD) WITH PROPOFOL ;  Surgeon: Asencion Blacksmith, MD;  Location: [facility] ENDOSCOPY;  Service: Endoscopy;  Laterality: N/A;   FLEXIBLE BRONCHOSCOPY Left 06/19/2022   Procedure: FLEXIBLE BRONCHOSCOPY;  Surgeon: Marc Senior, MD;  Location: [facility] ORS;  Service: Pulmonary;  Laterality: Left;   HOT HEMOSTASIS N/A 04/08/2020   Procedure: HOT HEMOSTASIS (ARGON PLASMA COAGULATION/BICAP);  Surgeon: Asencion Blacksmith, MD;  Location: [facility];  Service: Endoscopy;  Laterality: N/A;   IR IMAGING GUIDED PORT INSERTION  06/30/2022   KNEE ARTHROSCOPY Right 11/1998   LEFT HEART CATH AND CORONARY ANGIOGRAPHY N/A 05/23/2021   Procedure: LEFT HEART CATH AND CORONARY ANGIOGRAPHY;  Surgeon: Kyra Phy, MD;  Location: [facility] INVASIVE CV LAB;  Service: Cardiovascular;  Laterality:  N/A;   LOWER EXTREMITY ANGIOGRAPHY Right 12/27/2017   Procedure: LOWER EXTREMITY ANGIOGRAPHY;  Surgeon: Celso College, MD;  Location: [facility] INVASIVE CV LAB;  Service: Cardiovascular;  Laterality: Right;   LOWER EXTREMITY ANGIOGRAPHY Left 01/10/2018   Procedure: LOWER EXTREMITY ANGIOGRAPHY;  Surgeon: Celso College, MD;  Location: [facility] INV[facility] CV [facility];  Service: Cardiovascular;  Laterality: Left;   LOWER EXTREMITY ANGIOGRAPHY Left 05/29/2018   Procedure: LOWER EXTREMITY ANGIOGRAPHY;  Surgeon: Celso College, MD;  Location: [facility] INV[facility]IVE CV [facility];   Service: Cardiovascular;  Laterality: Left;   LOWER EXTREMITY ANGIOGRAPHY Left 05/30/2018   Procedure: Lower Extremity Angiography;  Surgeon: Celso College, MD;  Location: [facility] INVASIVE CV LAB;  Service: Cardiovascular;  Laterality: Left;   LP  07/2001   Microwave thermotherapy prostate  08/28/2007   [place]   Persantine cardiolite  04/1999   EF 68%   POLYPECTOMY  12/1993   Stress myoview   08/2004   EF 67%   UPPER GASTROINTESTINAL ENDOSCOPY      Social History   Socioeconomic History   Marital status: Widowed    Spouse name: Terrea Ferrier   Number of children: 3   Years of education: Not on file   Highest education level: Not on file  Occupational History   Occupation: Retired Music therapist  Tobacco Use   Smoking status: Former    Current packs/day: 0.00    Average packs/day: 2.0 packs/day for 57.3 years (114.5 ttl pk-yrs)    Types: Cigarettes    Start date: 02/07/1964    Quit date: 05/11/2021    Years since quitting: 2.0   Smokeless tobacco: Former    Types: Chew   Tobacco comments:    Quit in 2023- khj  Vaping Use   Vaping status: Never Used  Substance and Sexual Activity   Alcohol use: Yes    Comment: rare   Drug use: No   Sexual activity: Yes  Other Topics Concern   Not on file  Social History Narrative   Married 9/09 wife with moderate memory problems.   3 adult children, 2 grandchildren   Would desire CPR   Social Drivers of Health   Financial Resource Strain: Low Risk  (01/25/2023)   Overall Financial Resource Strain (CARDIA)    Difficulty of Paying Living Expenses: Not very hard  Food Insecurity: No Food Insecurity (01/25/2023)   Hunger Vital Sign    Worried About Running Out of Food in the Last Year: Never true    Ran Out of Food in the Last Year: Never true  Transportation Needs: No Transportation Needs (01/25/2023)   PRAPARE - Administrator, Civil Service (Medical): No    Lack of Transportation (Non-Medical): No  Physical Activity: Inactive  (01/25/2023)   Exercise Vital Sign    Days of Exercise per Week: 0 days    Minutes of Exercise per Session: 0 min  Stress: No Stress Concern Present (01/25/2023)   [employer] of Occupational Health - Occupational Stress Questionnaire    Feeling of Stress : Only a little  Social Connections: Moderately Isolated (01/25/2023)   Social Connection and Isolation Panel [NHANES]    Frequency of Communication with Friends and Family: Twice a week    Frequency of Social Gatherings with Friends and Family: Once a week    Attends Religious Services: More than 4 times per year    Active Member of [employer] or Organizations: No    Attends Banker Meetings: Never    Marital Status:  Widowed  Intimate Partner Violence: Not At Risk (01/25/2023)   Humiliation, Afraid, Rape, and Kick questionnaire    Fear of Current or Ex-Partner: No    Emotionally Abused: No    Physically Abused: No    Sexually Abused: No    Family History  Problem Relation Age of Onset   Rheum arthritis Mother    Diabetes Father    Alcohol abuse Paternal Uncle    Alcohol abuse Paternal Uncle    Alcohol abuse Paternal Uncle    Heart disease Neg Hx    Stroke Neg Hx    Cancer Neg Hx    Drug abuse Neg Hx    Depression Neg Hx    Colon cancer Neg Hx    Rectal cancer Neg Hx    Stomach cancer Neg Hx    Kidney cancer Neg Hx    Bladder Cancer Neg Hx    Prostate cancer Neg Hx      Current Outpatient Medications:    acetaminophen  (TYLENOL ) 500 MG tablet, Take 1,000 mg by mouth every 6 (six) hours. Rapid release, Disp: , Rfl:    albuterol  (VENTOLIN  HFA) 108 (90 Base) MCG/ACT inhaler, Inhale 2 puffs into the lungs every 6 (six) hours as needed for wheezing or shortness of breath., Disp: 8 g, Rfl: 2   atorvastatin  (LIPITOR) 20 MG tablet, TAKE 1 TABLET BY MOUTH IN THE EVENING FOR CHOLESTEROL, Disp: 90 tablet, Rfl: 2   clopidogrel  (PLAVIX ) 75 MG tablet, TAKE 1 TABLET BY MOUTH EVERY DAY, Disp: 90 tablet, Rfl: 3   ferrous  gluconate (FERGON) 324 MG tablet, TAKE 1 TABLET BY MOUTH EVERY DAY WITH BREAKFAST, Disp: 30 tablet, Rfl: 0   FIASP  FLEXTOUCH 100 UNIT/ML FlexTouch Pen, Inject 10 units before lunch meal each day., Disp: , Rfl:    FIBER PO, Take 1 capsule by mouth in the morning., Disp: , Rfl:    finasteride  (PROSCAR ) 5 MG tablet, TAKE 1 TABLET (5 MG TOTAL) BY MOUTH DAILY., Disp: 90 tablet, Rfl: 0   Fluticasone -Umeclidin-Vilant (TRELEGY ELLIPTA ) 100-62.5-25 MCG/ACT AEPB, Inhale 1 puff into the lungs daily., Disp: 28 each, Rfl: 11   glipiZIDE  (GLUCOTROL ) 10 MG tablet, Take 10 mg by mouth daily before breakfast., Disp: , Rfl:    GLUCOSAMINE HCL PO, Take 1 tablet by mouth daily., Disp: , Rfl:    insulin  aspart (NOVOLOG ) 100 UNIT/ML injection, Inject 10 Units into the skin once., Disp: , Rfl:    Insulin  Glargine (BASAGLAR KWIKPEN) 100 UNIT/ML, Inject 30 Units into the skin every morning., Disp: , Rfl:    insulin  glargine-yfgn (SEMGLEE) 100 UNIT/ML Pen, SMARTSIG:30 Unit(s) SUB-Q Daily, Disp: , Rfl:    lisinopril  (ZESTRIL ) 30 MG tablet, TAKE 1 TABLET (30 MG TOTAL) BY MOUTH DAILY. FOR BLOOD PRESSURE., Disp: 90 tablet, Rfl: 2   metFORMIN  (GLUCOPHAGE ) 500 MG tablet, TAKE 2 TABLETS BY MOUTH TWICE A DAY WITH MEAL FOR DIABETES (Patient taking differently: Take 1,000 mg by mouth 2 (two) times daily. TAKE 2 TABLETS BY MOUTH TWICE A DAY WITH MEAL FOR DIABETES), Disp: 360 tablet, Rfl: 2   pantoprazole  (PROTONIX ) 40 MG tablet, TAKE 1 TABLET (40 MG TOTAL) BY MOUTH DAILY. FOR HEARTBURN, Disp: 90 tablet, Rfl: 2   sildenafil  (VIAGRA ) 100 MG tablet, Take 1 tablet (100 mg total) by mouth daily as needed for erectile dysfunction., Disp: 30 tablet, Rfl: 0   tamsulosin  (FLOMAX ) 0.4 MG CAPS capsule, TAKE 1 CAPSULE BY MOUTH 2 TIMES DAILY., Disp: 180 capsule, Rfl: 3   vitamin B-12 (  CYANOCOBALAMIN) 1000 MCG tablet, Take 1,000 mcg by mouth in the morning., Disp: , Rfl:  No current facility-administered medications for this  visit.  Facility-Administered Medications Ordered in Other Visits:    sodium chloride  flush (NS) 0.9 % injection 10 mL, 10 mL, Intracatheter, PRN, Avonne Boettcher, MD, 10 mL at 01/19/23 1205  Physical exam:  Vitals:   06/08/23 0909  BP: (!) 159/81  Pulse: (!) 54  Resp: 18  Temp: (!) 97 F (36.1 C)  TempSrc: Tympanic  SpO2: 97%  Weight: 181 lb 6.4 oz (82.3 kg)  Height: 5\' 6"  (1.676 m)   Physical Exam Constitutional:      Comments: Ambulates with a cane.  Appears in no acute distress  Cardiovascular:     Rate and Rhythm: Normal rate and regular rhythm.     Heart sounds: Normal heart sounds.  Pulmonary:     Effort: Pulmonary effort is normal.     Breath sounds: Normal breath sounds.  Abdominal:     General: Bowel sounds are normal.     Palpations: Abdomen is soft.  Skin:    General: Skin is warm and dry.  Neurological:     Mental Status: He is alert and oriented to person, place, and time.      I have personally reviewed labs listed below:    Latest Ref Rng & Units 05/11/2023    8:26 AM  CMP  Glucose 70 - 99 mg/dL 409   BUN 8 - 23 mg/dL 17   Creatinine 8.11 - 1.24 mg/dL 9.14   Sodium 782 - 956 mmol/L 138   Potassium 3.5 - 5.1 mmol/L 4.3   Chloride 98 - 111 mmol/L 107   CO2 22 - 32 mmol/L 22   Calcium  8.9 - 10.3 mg/dL 8.7   Total Protein 6.5 - 8.1 g/dL 6.4   Total Bilirubin 0.0 - 1.2 mg/dL 0.5   Alkaline Phos 38 - 126 U/L 71   AST 15 - 41 U/L 16   ALT 0 - 44 U/L 11       Latest Ref Rng & Units 05/11/2023    8:26 AM  CBC  WBC 4.0 - 10.5 K/uL 6.8   Hemoglobin 13.0 - 17.0 g/dL 21.3   Hematocrit 08.6 - 52.0 % 34.7   Platelets 150 - 400 K/uL 255      Assessment and plan- Patient is a 78 y.o. male with history of locally recurrent stage III non-small cell lung cancer here for on treatment assessment prior to cycle 10 of maintenance durvalumab   Counts okay to proceed with cycle 10 of maintenance durvalumab  today.  I will see him back in 4 weeks for cycle 11.  Plan  is to stop durvalumab  after 12 cycles.  I will plan to repeat CT chest abdomen pelvis with contrast in the next 1 to 2 weeks.  Hyperglycemia: Last hemoglobin A1c in March 2025 was 7.3.  He is presently on on insulin .  Blood sugar today is elevated at 257.  Continue to monitor   Visit Diagnosis 1. Encounter for antineoplastic immunotherapy   2. Non-small cell cancer of right lung [facility])      Dr. Seretha Dance, MD, MPH [facility] at [facility] [phone] 06/08/2023 9:30 AM

## 2023-06-08 NOTE — Patient Instructions (Signed)
 [facility] - A DEPT OF [facility]  Discharge Instructions: Thank you for choosing [facility] to provide your oncology and hematology care.  If you have a lab appointment with the Cancer Center, please go directly to the Cancer Center and check in at the registration area.  Wear comfortable clothing and clothing appropriate for easy access to any Portacath or PICC line.   We strive to give you quality time with your provider. You may need to reschedule your appointment if you arrive late (15 or more minutes).  Arriving late affects you and other patients whose appointments are after yours.  Also, if you miss three or more appointments without notifying the office, you may be dismissed from the clinic at the provider's discretion.      For prescription refill requests, have your pharmacy contact our office and allow 72 hours for refills to be completed.    Today you received the following chemotherapy and/or immunotherapy agents Imfinzi      To help prevent nausea and vomiting after your treatment, we encourage you to take your nausea medication as directed.  BELOW ARE SYMPTOMS THAT SHOULD BE REPORTED IMMEDIATELY: *FEVER GREATER THAN 100.4 F (38 C) OR HIGHER *CHILLS OR SWEATING *NAUSEA AND VOMITING THAT IS NOT CONTROLLED WITH YOUR NAUSEA MEDICATION *UNUSUAL SHORTNESS OF BREATH *UNUSUAL BRUISING OR BLEEDING *URINARY PROBLEMS (pain or burning when urinating, or frequent urination) *BOWEL PROBLEMS (unusual diarrhea, constipation, pain near the anus) TENDERNESS IN MOUTH AND THROAT WITH OR WITHOUT PRESENCE OF ULCERS (sore throat, sores in mouth, or a toothache) UNUSUAL RASH, SWELLING OR PAIN  UNUSUAL VAGINAL DISCHARGE OR ITCHING   Items with * indicate a potential emergency and should be followed up as soon as possible or go to the Emergency Department if any problems should occur.  Please show the CHEMOTHERAPY ALERT CARD or IMMUNOTHERAPY  ALERT CARD at check-in to the Emergency Department and triage nurse.  Should you have questions after your visit or need to cancel or reschedule your appointment, please contact [facility] - A DEPT OF [facility]  [phone] and follow the prompts.  Office hours are 8:00 a.m. to 4:30 p.m. Monday - Friday. Please note that voicemails left after 4:00 p.m. may not be returned until the following business day.  We are closed weekends and major holidays. You have access to a nurse at all times for urgent questions. Please call the main number to the clinic [phone] and follow the prompts.  For any non-urgent questions, you may also contact your provider using MyChart. We now offer e-Visits for anyone 1 and older to request care online for non-urgent symptoms. For details visit [email or web address].   Also download the MyChart app! Go to the app store, search "MyChart", open the app, select [place], and log in with your MyChart username and password.

## 2023-06-22 ENCOUNTER — Ambulatory Visit
Admission: RE | Admit: 2023-06-22 | Discharge: 2023-06-22 | Disposition: A | Discharge status: Home / Self Care | Source: Ambulatory Visit | Attending: Oncology | Admitting: Oncology

## 2023-06-22 DIAGNOSIS — C3490 Malignant neoplasm of unspecified part of unspecified bronchus or lung: Secondary | ICD-10-CM | POA: Diagnosis not present

## 2023-06-22 DIAGNOSIS — N3289 Other specified disorders of bladder: Secondary | ICD-10-CM | POA: Diagnosis not present

## 2023-06-22 DIAGNOSIS — C679 Malignant neoplasm of bladder, unspecified: Secondary | ICD-10-CM | POA: Diagnosis not present

## 2023-06-22 MED ORDER — IOHEXOL 300 MG/ML  SOLN
85.0000 mL | Freq: Once | INTRAMUSCULAR | Status: AC | PRN
  Administered 2023-06-22: 85 mL via INTRAVENOUS

## 2023-06-26 ENCOUNTER — Encounter (INDEPENDENT_AMBULATORY_CARE_PROVIDER_SITE_OTHER): Payer: Self-pay

## 2023-06-28 ENCOUNTER — Encounter: Payer: Self-pay | Admitting: Urology

## 2023-06-28 ENCOUNTER — Ambulatory Visit (INDEPENDENT_AMBULATORY_CARE_PROVIDER_SITE_OTHER): Payer: Self-pay | Admitting: Urology

## 2023-06-28 VITALS — BP 176/63 | HR 96 | Ht 66.0 in | Wt 183.0 lb

## 2023-06-28 DIAGNOSIS — N390 Urinary tract infection, site not specified: Secondary | ICD-10-CM

## 2023-06-28 DIAGNOSIS — N138 Other obstructive and reflux uropathy: Secondary | ICD-10-CM | POA: Diagnosis not present

## 2023-06-28 DIAGNOSIS — R339 Retention of urine, unspecified: Secondary | ICD-10-CM

## 2023-06-28 DIAGNOSIS — N529 Male erectile dysfunction, unspecified: Secondary | ICD-10-CM | POA: Diagnosis not present

## 2023-06-28 DIAGNOSIS — N401 Enlarged prostate with lower urinary tract symptoms: Secondary | ICD-10-CM

## 2023-06-28 LAB — BLADDER SCAN AMB NON-IMAGING

## 2023-06-28 MED ORDER — FINASTERIDE 5 MG PO TABS: 5.0000 mg | ORAL_TABLET | Freq: Every day | ORAL | 0 refills | Status: DC

## 2023-06-28 NOTE — Progress Notes (Signed)
 9:49 AM   [name] 22-Jul-1945 [id number]  Referring provider: Gabriel Zacharius, NP [address] [place],  [place] [address]  Chief Complaint  Patient presents with   Follow-up   Urological history 1.  Urinary retention -Contributing factors of age, excessive alcohol consumption, BPH and diabetes -tamsulosin  0.4 mg 2 capsules daily and finasteride  5 mg daily  2. BPH with LU TS -PSA (06/2021) 0.2 -TARGIS > 10 years ago -cysto 2019 -very large prostate with significant lateral lobe hypertrophy trophy in the median lobe with intravesicular protrusion -tamsulosin  0.4 mg 2 capsules daily and finasteride  5 mg daily  3. ED - sildenafil  100 mg, on-demand-dosing   HPI: Antonio [name] is a 78 y.o. male who presents today for yearly follow up.   Previous records reviewed.     I PSS 15/3  PVR 0 mL   He still has some episodes where he is unable to empty his bladder.  He has had to cath himself about 3 months ago to empty his bladder.  He has been voiding spontaneous otherwise.  Patient denies any modifying or aggravating factors.  Patient denies any recent UTI's, gross hematuria, dysuria or suprapubic/flank pain.  Patient denies any fevers, chills, nausea or vomiting.    He has Viagra  listed in his prescription medications, but he states he has not had to use Viagra  for erections in the last 6 months.    IPSS     Row Name 06/28/23 0900         International Prostate Symptom Score   How often have you had the sensation of not emptying your bladder? Less than half the time     How often have you had to urinate less than every two hours? Less than half the time     How often have you found you stopped and started again several times when you urinated? About half the time     How often have you found it difficult to postpone urination? Less than half the time     How often have you had a weak urinary stream? About half the time     How often have you had to strain to  start urination? Less than 1 in 5 times     How many times did you typically get up at night to urinate? 2 Times     Total IPSS Score 15       Quality of Life due to urinary symptoms   If you were to spend the rest of your life with your urinary condition just the way it is now how would you feel about that? Mixed              Score:  1-7 Mild 8-19 Moderate 20-35 Severe   PMH: Past Medical History:  Diagnosis Date   Adenoma of left adrenal gland    Anemia    Aortic atherosclerosis (HCC)    Arthritis    Atrial fibrillation and flutter (HCC)    a.) CHA2DS2VASc = 7 (age x 2, HTN, CVA x 2, vascular disease history, T2DM);  b.) rate/rhythm maintained without pharmacological intervention; chronic antithrombotic therapy using ASA + clopidogrel    Bilateral carotid artery stenosis    a.) s/p PTA 04/07/2022 --> 90% RICA --> 9x7x30 Exact stent; b.) doppler 03/14/20214: 1-39% RICA, 40-59% LICA   BPH with urinary obstruction    stable on flomax  ([name]   Childhood asthma    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   a.) CT head and MRI brain 04/05/2022: chronic cortical/subcortical RIGHT MCA territory infarct within the RIGHT parietal lobe and RIGHT temporoparietal junction   Coronary atherosclerosis of unspecified type of vessel, native or graft    a.) cCTA 05/22/2021: Ca score 1524 (84th percentile for age/sex match control) --> FFRct: dLAD = 0.77, dLCx = 0.67, RPLA = 0.77, dOM1 = 0.66; b.) LHC 05/23/2021: 20% dRCA, 50% OM1 - med mgmt   COVID-19 virus infection 12/07/2022   Diastolic dysfunction    a.) TTE 04/06/2022: EF 60-65%, mod LVH, mild MR, AoV sclerosis, G1DD   DOE (dyspnea on exertion)    Emphysema of lung (HCC)    Erectile dysfunction    a.) on PDE5i (sildenafil )   Esophageal stricture    Fall    Gastric AVM    Gastritis    GERD (gastroesophageal reflux disease)    Hematoma 05/22/2022   Hiatal hernia    Hx of colonic polyps    Hyperlipidemia    Hypertension     Internal hemorrhoids without mention of complication    Ischemic leg    Long term current use of antithrombotics/antiplatelets    a.) on DAPT (ASA + clopidogrel )   Lumbago    Mobitz (type) I (Wenckebach's) atrioventricular block    Non-small cell lung cancer (HCC)    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   a.) PCR (+) 05/29/2018, 04/07/2022   Pain in limb 12/11/2017   Pneumonia    PSA elevation    now averaging 2's   PVD (peripheral vascular disease) with claudication (HCC)    Sigmoid diverticulosis    Tobacco abuse    Type 2 diabetes mellitus treated with insulin  [facility])     Surgical History: Past Surgical History:  Procedure Laterality Date   2D Echo  07/2001   BACK SURGERY     CAROTID PTA/STENT INTERVENTION Right 04/07/2022   Procedure: CAROTID PTA/STENT INTERVENTION;  Surgeon: Jackquelyn Mass, MD;  Location: [facility];  Service: Cardiovascular;  Laterality: Right;   COLONOSCOPY     ENDOBRONCHIAL ULTRASOUND Left 06/19/2022   Procedure: ENDOBRONCHIAL ULTRASOUND;  Surgeon: Marc Senior, MD;  Location: [facility] ORS;  Service: Pulmonary;  Laterality: Left;   ESOPHAGOGASTRODUODENOSCOPY  02/13/2007   gastritis and duodenitis without bleed   ESOPHAGOGASTRODUODENOSCOPY N/A 06/26/2014   Procedure: ESOPHAGOGASTRODUODENOSCOPY (EGD);  Surgeon: Janel Medford, MD;  Location[facility];  Service: Endoscopy;  Laterality: N/A;   ESOPHAGOGASTRODUODENOSCOPY (EGD) WITH PROPOFOL  N/A 04/08/2020   Procedure: ESOPHAGOGASTRODUODENOSCOPY (EGD) WITH PROPOFOL ;  Surgeon: Asencion Blacksmith, MD;  Location: [facility];  Service: Endoscopy;  Laterality: N/A;   FLEXIBLE BRONCHOSCOPY Left 06/19/2022   Procedure: FLEXIBLE BRONCHOSCOPY;  Surgeon: Marc Senior, MD;  Location: [facility] OR[facility];  Service: Pulmonary;  Laterality: Left;   HOT HEMOSTASIS N/A 04/08/2020   Procedure: HOT HEMOSTASIS (ARGON PLASMA COAGULATION/BICAP);  Surgeon: Asencion Blacksmith, MD;  Location: [facility];  Service:  Endoscopy;  Laterality: N/A;   IR IMAGING GUIDED PORT INSERTION  06/30/2022   KNEE ARTHROSCOPY Right 11/1998   LEFT HEART CATH AND CORONARY ANGIOGRAPHY N/A 05/23/2021   Procedure: LEFT HEART CATH AND CORONARY ANGIOGRAPHY;  Surgeon: Kyra Phy, MD;  Location: [facility];  Service: Cardiovascular;  Laterality: N/A;   LOWER EXTREMITY ANGIOGRAPHY Right 12/27/2017   Procedure: LOWER EXTREMITY ANGIOGRAPHY;  Surgeon: Celso College, MD;  Location: [facility];  Service: Cardiovascular;  Laterality: Right;   LOWER EXTREMITY ANGIOGRAPHY Left 01/10/2018   Procedure:  LOWER EXTREMITY ANGIOGRAPHY;  Surgeon: Celso College, MD;  Location: [facility];  Service: Cardiovascular;  Laterality: Left;   LOWER EXTREMITY ANGIOGRAPHY Left 05/29/2018   Procedure: LOWER EXTREMITY ANGIOGRAPHY;  Surgeon: Celso College, MD;  Location: [facility];  Service: Cardiovascular;  Laterality: Left;   LOWER EXTREMITY ANGIOGRAPHY Left 05/30/2018   Procedure: Lower Extremity Angiography;  Surgeon: Celso College, MD;  Location: [facility];  Service: Cardiovascular;  Laterality: Left;   LP  07/2001   Microwave thermotherapy prostate  08/28/2007   [place]   Persantine cardiolite  04/1999   EF 68%   POLYPECTOMY  12/1993   Stress myoview   08/2004   EF 67%   UPPER GASTROINTESTINAL ENDOSCOPY      Home Medications:  Allergies as of 06/28/2023   No Known Allergies      Medication List        Accurate as of Jun 28, 2023  9:49 AM. If you have any questions, ask your nurse or doctor.          acetaminophen  500 MG tablet Commonly known as: TYLENOL  Take 1,000 mg by mouth every 6 (six) hours. Rapid release   albuterol  108 (90 Base) MCG/ACT inhaler Commonly known as: VENTOLIN  HFA Inhale 2 puffs into the lungs every 6 (six) hours as needed for wheezing or shortness of breath.   atorvastatin  20 MG tablet Commonly known as: LIPITOR TAKE 1 TABLET BY MOUTH IN THE EVENING FOR  CHOLESTEROL   Basaglar KwikPen 100 UNIT/ML Inject 30 Units into the skin every morning.   clopidogrel  75 MG tablet Commonly known as: PLAVIX  TAKE 1 TABLET BY MOUTH EVERY DAY   cyanocobalamin 1000 MCG tablet Commonly known as: VITAMIN B12 Take 1,000 mcg by mouth in the morning.   ferrous gluconate  324 MG tablet Commonly known as: FERGON TAKE 1 TABLET BY MOUTH EVERY DAY WITH BREAKFAST   Fiasp  FlexTouch 100 UNIT/ML FlexTouch Pen Generic drug: insulin  aspart Inject 10 units before lunch meal each day.   FIBER PO Take 1 capsule by mouth in the morning.   finasteride  5 MG tablet Commonly known as: PROSCAR  Take 1 tablet (5 mg total) by mouth daily.   glipiZIDE  10 MG tablet Commonly known as: GLUCOTROL  Take 10 mg by mouth daily before breakfast.   GLUCOSAMINE HCL PO Take 1 tablet by mouth daily.   insulin  aspart 100 UNIT/ML injection Commonly known as: novoLOG  Inject 10 Units into the skin once.   insulin  glargine-yfgn 100 UNIT/ML Pen Commonly known as: SEMGLEE SMARTSIG:30 Unit(s) SUB-Q Daily   lisinopril  30 MG tablet Commonly known as: ZESTRIL  TAKE 1 TABLET (30 MG TOTAL) BY MOUTH DAILY. FOR BLOOD PRESSURE.   metFORMIN  500 MG tablet Commonly known as: GLUCOPHAGE  TAKE 2 TABLETS BY MOUTH TWICE A DAY WITH MEAL FOR DIABETES What changed:  how much to take how to take this when to take this   pantoprazole  40 MG tablet Commonly known as: PROTONIX  TAKE 1 TABLET (40 MG TOTAL) BY MOUTH DAILY. FOR HEARTBURN   sildenafil  100 MG tablet Commonly known as: Viagra  Take 1 tablet (100 mg total) by mouth daily as needed for erectile dysfunction.   tamsulosin  0.4 MG Caps capsule Commonly known as: FLOMAX  TAKE 1 CAPSULE BY MOUTH 2 TIMES DAILY.   Trelegy Ellipta  100-62.5-25 MCG/ACT Aepb Generic drug: Fluticasone -Umeclidin-Vilant Inhale 1 puff into the lungs daily.        Allergies: No Known Allergies  Family History: Family History  Problem Relation  Age of Onset    Rheum arthritis Mother    Diabetes Father    Alcohol abuse Paternal Uncle    Alcohol abuse Paternal Uncle    Alcohol abuse Paternal Uncle    Heart disease Neg Hx    Stroke Neg Hx    Cancer Neg Hx    Drug abuse Neg Hx    Depression Neg Hx    Colon cancer Neg Hx    Rectal cancer Neg Hx    Stomach cancer Neg Hx    Kidney cancer Neg Hx    Bladder Cancer Neg Hx    Prostate cancer Neg Hx     Social History:  reports that he quit smoking about 2 years ago. His smoking use included cigarettes. He started smoking about 59 years ago. He has a 114.5 pack-year smoking history. He has quit using smokeless tobacco.  His smokeless tobacco use included chew. He reports current alcohol use. He reports that he does not use drugs.  ROS: For pertinent review of systems please refer to history of present illness  Physical Exam: Blood pressure (!) 176/63, pulse 96, height 5\' 6"  (1.676 m), weight 183 lb (83 kg).  Constitutional:  Well nourished. Alert and oriented, No acute distress. HEENT: [place] AT, moist mucus membranes.  Trachea midline Cardiovascular: No clubbing, cyanosis, or edema. Respiratory: Normal respiratory effort, no increased work of breathing. Neurologic: Grossly intact, no focal deficits, moving all 4 extremities. Psychiatric: Normal mood and affect.   Laboratory Data: CMP     Component Value Date/Time   NA 139 06/08/2023 0855   NA 141 07/10/2014 1025   K 4.4 06/08/2023 0855   CL 109 06/08/2023 0855   CO2 22 06/08/2023 0855   GLUCOSE 283 (H) 06/08/2023 0855   BUN 20 06/08/2023 0855   BUN 12 07/10/2014 1025   CREATININE 0.84 06/08/2023 0855   CALCIUM  8.0 (L) 06/08/2023 0855   PROT 6.1 (L) 06/08/2023 0855   ALBUMIN 3.4 (L) 06/08/2023 0855   AST 15 06/08/2023 0855   ALT 10 06/08/2023 0855   ALKPHOS 61 06/08/2023 0855   BILITOT 0.3 06/08/2023 0855   GFR 82.78 04/14/2022 0933   GFRNONAA >60 06/08/2023 0855    CBC    Component Value Date/Time   WBC 7.1 06/08/2023 0855   WBC  8.7 04/14/2022 0933   RBC 3.83 (L) 06/08/2023 0855   HGB 10.8 (L) 06/08/2023 0855   HCT 33.5 (L) 06/08/2023 0855   PLT 219 06/08/2023 0855   MCV 87.5 06/08/2023 0855   MCH 28.2 06/08/2023 0855   MCHC 32.2 06/08/2023 0855   RDW 14.6 06/08/2023 0855   LYMPHSABS 0.8 06/08/2023 0855   MONOABS 0.6 06/08/2023 0855   EOSABS 0.3 06/08/2023 0855   BASOSABS 0.1 06/08/2023 0855    Hemoglobin A1C Order: [id number] Component Ref Range & Units 2 mo ago  Hemoglobin A1C 4.2 - 5.6 % 7.3 High   Average Blood Glucose (Calc) mg/dL 952  Resulting Agency [facility]  Narrative Performed by [facility] Normal Range:    4.2 - 5.6% Increased Risk:  5.7 - 6.4% Diabetes:        >= 6.5% Glycemic Control for adults with diabetes:  <7%    Specimen Collected: 04/26/23 07:42   Performed by: [facility] Last Resulted: 04/26/23 08:39  Received From: [facility]  Result Received: 04/30/23 17:50    Pertinent Imaging:  06/28/23 09:21  Scan Result 0ml  Assessment & Plan:   1. BPH with LU TS - mild bother -continue tamsulosin  0.4 mg 2 tablets daily and finasteride  5 mg daily  2.  Urinary retention - Still occurs intermittently, managed with in and out cath  3. ED - Has been having satisfactory erection over the last 6 months  Return in about 1 year (around 06/27/2024) for I PSS, PVR .  Briant Camper  [facility] [address] [place], [place] [address] [phone]

## 2023-06-29 ENCOUNTER — Other Ambulatory Visit: Payer: Self-pay

## 2023-07-06 ENCOUNTER — Inpatient Hospital Stay: Admitting: Oncology

## 2023-07-06 ENCOUNTER — Encounter: Payer: Self-pay | Admitting: Oncology

## 2023-07-06 ENCOUNTER — Inpatient Hospital Stay

## 2023-07-06 VITALS — BP 154/86 | HR 43 | Temp 97.1°F | Resp 20 | Wt 180.0 lb

## 2023-07-06 VITALS — BP 171/78 | HR 58

## 2023-07-06 DIAGNOSIS — C3490 Malignant neoplasm of unspecified part of unspecified bronchus or lung: Secondary | ICD-10-CM

## 2023-07-06 DIAGNOSIS — C3412 Malignant neoplasm of upper lobe, left bronchus or lung: Secondary | ICD-10-CM

## 2023-07-06 DIAGNOSIS — Z5112 Encounter for antineoplastic immunotherapy: Secondary | ICD-10-CM

## 2023-07-06 DIAGNOSIS — Z7189 Other specified counseling: Secondary | ICD-10-CM | POA: Diagnosis not present

## 2023-07-06 DIAGNOSIS — Z87891 Personal history of nicotine dependence: Secondary | ICD-10-CM | POA: Diagnosis not present

## 2023-07-06 DIAGNOSIS — C3492 Malignant neoplasm of unspecified part of left bronchus or lung: Secondary | ICD-10-CM

## 2023-07-06 DIAGNOSIS — Z79899 Other long term (current) drug therapy: Secondary | ICD-10-CM | POA: Diagnosis not present

## 2023-07-06 LAB — CMP (CANCER CENTER ONLY)
ALT: 11 U/L (ref 0–44)
AST: 17 U/L (ref 15–41)
Albumin: 3.4 g/dL — ABNORMAL LOW (ref 3.5–5.0)
Alkaline Phosphatase: 58 U/L (ref 38–126)
Anion gap: 9 (ref 5–15)
BUN: 21 mg/dL (ref 8–23)
CO2: 20 mmol/L — ABNORMAL LOW (ref 22–32)
Calcium: 8.3 mg/dL — ABNORMAL LOW (ref 8.9–10.3)
Chloride: 108 mmol/L (ref 98–111)
Creatinine: 0.9 mg/dL (ref 0.61–1.24)
GFR, Estimated: >60 mL/min (ref >60)
Glucose, Bld: 246 mg/dL — ABNORMAL HIGH (ref 70–99)
Potassium: 4.4 mmol/L (ref 3.5–5.1)
Sodium: 137 mmol/L (ref 135–145)
Total Bilirubin: 0.4 mg/dL (ref 0.0–1.2)
Total Protein: 6.4 g/dL — ABNORMAL LOW (ref 6.5–8.1)

## 2023-07-06 LAB — CBC WITH DIFFERENTIAL (CANCER CENTER ONLY)
Abs Immature Granulocytes: 0.04 10*3/uL (ref 0.00–0.07)
Basophils Absolute: 0.1 10*3/uL (ref 0.0–0.1)
Basophils Relative: 1 %
Eosinophils Absolute: 0.2 10*3/uL (ref 0.0–0.5)
Eosinophils Relative: 3 %
HCT: 33.5 % — ABNORMAL LOW (ref 39.0–52.0)
Hemoglobin: 10.7 g/dL — ABNORMAL LOW (ref 13.0–17.0)
Immature Granulocytes: 1 %
Lymphocytes Relative: 14 %
Lymphs Abs: 0.9 10*3/uL (ref 0.7–4.0)
MCH: 27.7 pg (ref 26.0–34.0)
MCHC: 31.9 g/dL (ref 30.0–36.0)
MCV: 86.8 fL (ref 80.0–100.0)
Monocytes Absolute: 0.5 10*3/uL (ref 0.1–1.0)
Monocytes Relative: 7 %
Neutro Abs: 4.8 10*3/uL (ref 1.7–7.7)
Neutrophils Relative %: 74 %
Platelet Count: 222 10*3/uL (ref 150–400)
RBC: 3.86 MIL/uL — ABNORMAL LOW (ref 4.22–5.81)
RDW: 15.2 % (ref 11.5–15.5)
WBC Count: 6.5 10*3/uL (ref 4.0–10.5)
nRBC: 0 % (ref 0.0–0.2)

## 2023-07-06 MED ORDER — SODIUM CHLORIDE 0.9 % IV SOLN
1500.0000 mg | Freq: Once | INTRAVENOUS | Status: AC
  Administered 2023-07-06: 1500 mg via INTRAVENOUS
  Filled 2023-07-06: qty 30

## 2023-07-06 MED ORDER — HEPARIN SOD (PORK) LOCK FLUSH 100 UNIT/ML IV SOLN
500.0000 [IU] | Freq: Once | INTRAVENOUS | Status: AC | PRN
  Administered 2023-07-06: 500 [IU]
  Filled 2023-07-06: qty 5

## 2023-07-06 MED ORDER — SODIUM CHLORIDE 0.9 % IV SOLN
Freq: Once | INTRAVENOUS | Status: AC
  Filled 2023-07-06: qty 250

## 2023-07-06 NOTE — Patient Instructions (Signed)

## 2023-07-06 NOTE — Progress Notes (Signed)
 Hematology/Oncology Consult note [facility]  [phone] [phone]  Patient Care Team: Gabriel Sho, NP as PCP - General (Internal Medicine) Constancia Delton, MD as PCP - Cardiology (Cardiology) Verona Goodwill, MD as PCP - Electrophysiology (Cardiology) Oda Bence, PA-C as Physician Assistant (Urology) Asencion Blacksmith, MD (Inactive) as Consulting Physician (Gastroenterology) Jonathan Neighbor, [facility] (Inactive) as Pharmacist (Pharmacist) Drake Gens, RN as Oncology Nurse Navigator Avonne Boettcher, MD as Consulting Physician (Oncology) Clair Crews, MD as Referring Physician (Ophthalmology) Avonne Boettcher, MD as Consulting Physician (Oncology)   Name of the patient: Antonio [name]  [id number]  1945-02-17   Date of visit: 07/06/23  Diagnosis- locally recurrent adenocarcinoma of the lung    Chief complaint/ Reason for visit- on treatment assessment prior to cycle 11 of maintenance durvalumab   Heme/Onc history:  patient is a 78 year old male who was diagnosed with stage I non-small cell lung cancer involving the left upper lobe about 1 year ago and he received SBRT for the same. He has been undergoing surveillance CT scans since then. CT chest in April 2024 showed increasing areas of disease with left-sided mediastinal and hilar lymph nodes and soft tissue along the left main bronchus. 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 No residual focal hypermetabolism in the treated area of the left upper lobe. No evidence of distant metastatic disease. He underwent MRI brain without contrast in February 2024 which did not show any evidence of metastatic disease. Patient was seen by Dr. Viva Grise and underwent EBUS guided biopsies left paratracheal lymph node was biopsied and consistent with pulmonary adenocarcinoma.  Patient completed  concurrent chemoradiation with weekly CarboTaxol chemotherapy in July 2024.  Not enough tissue available for NGS testing.  Circulogene assay was sent by pulmonary and peripheral blood NGS testing did not show any evidence of EGFR or ALK mutation    Patient started on maintenance durvalumab  in August 2024    Interval history-doing well presently.  Diarrhea is mild and overall stable  ECOG PS- 1 Pain scale- 0   Review of systems- Review of Systems  Constitutional:  Negative for chills, fever, malaise/fatigue and weight loss.  HENT:  Negative for congestion, ear discharge and nosebleeds.   Eyes:  Negative for blurred vision.  Respiratory:  Negative for cough, hemoptysis, sputum production, shortness of breath and wheezing.   Cardiovascular:  Negative for chest pain, palpitations, orthopnea and claudication.  Gastrointestinal:  Negative for abdominal pain, blood in stool, constipation, diarrhea, heartburn, melena, nausea and vomiting.  Genitourinary:  Negative for dysuria, flank pain, frequency, hematuria and urgency.  Musculoskeletal:  Negative for back pain, joint pain and myalgias.  Skin:  Negative for rash.  Neurological:  Negative for dizziness, tingling, focal weakness, seizures, weakness and headaches.  Endo/Heme/Allergies:  Does not bruise/bleed easily.  Psychiatric/Behavioral:  Negative for depression and suicidal ideas. The patient does not have insomnia.       No Known Allergies   Past Medical History:  Diagnosis Date   Adenoma of left adrenal gland    Anemia    Aortic atherosclerosis (HCC)    Arthritis    Atrial fibrillation and flutter (HCC)    a.) CHA2DS2VASc = 7 (age x 2, HTN, CVA x 2, vascular disease history, T2DM);  b.) rate/rhythm maintained without pharmacological intervention; chronic antithrombotic therapy using ASA + clopidogrel    Bilateral carotid artery stenosis  a.) s/p PTA 04/07/2022 --> 90% RICA --> 9x7x30 Exact stent; b.) doppler 03/14/20214: 1-39%  RICA, 40-59% LICA   BPH with urinary obstruction    stable on flomax  ([name]   Childhood asthma    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   a.) CT head and MRI brain 04/05/2022: chronic cortical/subcortical RIGHT MCA territory infarct within the RIGHT parietal lobe and RIGHT temporoparietal junction   Coronary atherosclerosis of unspecified type of vessel, native or graft    a.) cCTA 05/22/2021: Ca score 1524 (84th percentile for age/sex match control) --> FFRct: dLAD = 0.77, dLCx = 0.67, RPLA = 0.77, dOM1 = 0.66; b.) LHC 05/23/2021: 20% dRCA, 50% OM1 - med mgmt   COVID-19 virus infection 12/07/2022   Diastolic dysfunction    a.) TTE 04/06/2022: EF 60-65%, mod LVH, mild MR, AoV sclerosis, G1DD   DOE (dyspnea on exertion)    Emphysema of lung (HCC)    Erectile dysfunction    a.) on PDE5i (sildenafil )   Esophageal stricture    Fall    Gastric AVM    Gastritis    GERD (gastroesophageal reflux disease)    Hematoma 05/22/2022   Hiatal hernia    Hx of colonic polyps    Hyperlipidemia    Hypertension    Internal hemorrhoids without mention of complication    Ischemic leg    Long term current use of antithrombotics/antiplatelets    a.) on DAPT (ASA + clopidogrel )   Lumbago    Mobitz (type) I (Wenckebach's) atrioventricular block    Non-small cell lung cancer (HCC)    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   a.) PCR (+) 05/29/2018, 04/07/2022   Pain in limb 12/11/2017   Pneumonia    PSA elevation    now averaging 2's   PVD (peripheral vascular disease) with claudication (HCC)    Sigmoid diverticulosis    Tobacco abuse    Type 2 diabetes mellitus treated with insulin  [name])      Past Surgical History:  Procedure Laterality Date   2D Echo  07/2001   BACK SURGERY     CAROTID PTA/STENT INTERVENTION Right 04/07/2022   Procedure: CAROTID PTA/STENT INTERVENTION;  Surgeon: Jackquelyn Mass, MD;  Location: [facility] INVASIVE CV LAB;  Service: Cardiovascular;  Laterality: Right;    COLONOSCOPY     ENDOBRONCHIAL ULTRASOUND Left 06/19/2022   Procedure: ENDOBRONCHIAL ULTRASOUND;  Surgeon: Marc Senior, MD;  Location: [facility] ORS;  Service: Pulmonary;  Laterality: Left;   ESOPHAGOGASTRODUODENOSCOPY  02/13/2007   gastritis and duodenitis without bleed   ESOPHAGOGASTRODUODENOSCOPY N/A 06/26/2014   Procedure: ESOPHAGOGASTRODUODENOSCOPY (EGD);  Surgeon: Janel Medford, MD;  Location: [facility] ENDOSCOPY;  Service: Endoscopy;  Laterality: N/A;   ESOPHAGOGASTRODUODENOSCOPY (EGD) WITH PROPOFOL  N/A 04/08/2020   Procedure: ESOPHAGOGASTRODUODENOSCOPY (EGD) WITH PROPOFOL ;  Surgeon: Asencion Blacksmith, MD;  Location: [facility] ENDOSCOPY;  Service: Endoscopy;  Laterality: N/A;   FLEXIBLE BRONCHOSCOPY Left 06/19/2022   Procedure: FLEXIBLE BRONCHOSCOPY;  Surgeon: Marc Senior, MD;  Location: [facility] ORS;  Service: Pulmonary;  Laterality: Left;   HOT HEMOSTASIS N/A 04/08/2020   Procedure: HOT HEMOSTASIS (ARGON PLASMA COAGULATION/BICAP);  Surgeon: Asencion Blacksmith, MD;  Location: [facility];  Service: Endoscopy;  Laterality: N/A;   IR IMAGING GUIDED PORT INSERTION  06/30/2022   KNEE ARTHROSCOPY Right 11/1998   LEFT HEART CATH AND CORONARY ANGIOGRAPHY N/A 05/23/2021   Procedure: LEFT HEART CATH AND CORONARY ANGIOGRAPHY;  Surgeon: Kyra Phy, MD;  Location: [facility] INVASIVE CV LAB;  Service: Cardiovascular;  Laterality:  N/A;   LOWER EXTREMITY ANGIOGRAPHY Right 12/27/2017   Procedure: LOWER EXTREMITY ANGIOGRAPHY;  Surgeon: Celso College, MD;  Location: [facility] INVASIVE CV LAB;  Service: Cardiovascular;  Laterality: Right;   LOWER EXTREMITY ANGIOGRAPHY Left 01/10/2018   Procedure: LOWER EXTREMITY ANGIOGRAPHY;  Surgeon: Celso College, MD;  Location: [facility];  Service: Cardiovascular;  Laterality: Left;   LOWER EXTREMITY ANGIOGRAPHY Left 05/29/2018   Procedure: LOWER EXTREMITY ANGIOGRAPHY;  Surgeon: Celso College, MD;  Location: [facility] INVASIVE CV [facility];  Service: Cardiovascular;  Laterality: Left;    LOWER EXTREMITY ANGIOGRAPHY Left 05/30/2018   Procedure: Lower Extremity Angiography;  Surgeon: Celso College, MD;  Location: [facility] INVASIVE CV LAB;  Service: Cardiovascular;  Laterality: Left;   LP  07/2001   Microwave thermotherapy prostate  08/28/2007   [place]   Persantine cardiolite  04/1999   EF 68%   POLYPECTOMY  12/1993   Stress myoview   08/2004   EF 67%   UPPER GASTROINTESTINAL ENDOSCOPY      Social History   Socioeconomic History   Marital status: Widowed    Spouse name: Terrea Ferrier   Number of children: 3   Years of education: Not on file   Highest education level: Not on file  Occupational History   Occupation: Retired Music therapist  Tobacco Use   Smoking status: Former    Current packs/day: 0.00    Average packs/day: 2.0 packs/day for 57.3 years (114.5 ttl pk-yrs)    Types: Cigarettes    Start date: 02/07/1964    Quit date: 05/11/2021    Years since quitting: 2.1   Smokeless tobacco: Former    Types: Chew   Tobacco comments:    Quit in 2023- khj  Vaping Use   Vaping status: Never Used  Substance and Sexual Activity   Alcohol use: Yes    Comment: rare   Drug use: No   Sexual activity: Yes  Other Topics Concern   Not on file  Social History Narrative   Married 9/09 wife with moderate memory problems.   3 adult children, 2 grandchildren   Would desire CPR   Social Drivers of Health   Financial Resource Strain: Low Risk  (01/25/2023)   Overall Financial Resource Strain (CARDIA)    Difficulty of Paying Living Expenses: Not very hard  Food Insecurity: No Food Insecurity (01/25/2023)   Hunger Vital Sign    Worried About Running Out of Food in the Last Year: Never true    Ran Out of Food in the Last Year: Never true  Transportation Needs: No Transportation Needs (01/25/2023)   PRAPARE - Administrator, Civil Service (Medical): No    Lack of Transportation (Non-Medical): No  Physical Activity: Inactive (01/25/2023)   Exercise Vital Sign    Days of  Exercise per Week: 0 days    Minutes of Exercise per Session: 0 min  Stress: No Stress Concern Present (01/25/2023)   [employer] of Occupational Health - Occupational Stress Questionnaire    Feeling of Stress : Only a little  Social Connections: Moderately Isolated (01/25/2023)   Social Connection and Isolation Panel [NHANES]    Frequency of Communication with Friends and Family: Twice a week    Frequency of Social Gatherings with Friends and Family: Once a week    Attends Religious Services: More than 4 times per year    Active Member of [employer] or Organizations: No    Attends Banker Meetings: Never    Marital Status:  Widowed  Intimate Partner Violence: Not At Risk (01/25/2023)   Humiliation, Afraid, Rape, and Kick questionnaire    Fear of Current or Ex-Partner: No    Emotionally Abused: No    Physically Abused: No    Sexually Abused: No    Family History  Problem Relation Age of Onset   Rheum arthritis Mother    Diabetes Father    Alcohol abuse Paternal Uncle    Alcohol abuse Paternal Uncle    Alcohol abuse Paternal Uncle    Heart disease Neg Hx    Stroke Neg Hx    Cancer Neg Hx    Drug abuse Neg Hx    Depression Neg Hx    Colon cancer Neg Hx    Rectal cancer Neg Hx    Stomach cancer Neg Hx    Kidney cancer Neg Hx    Bladder Cancer Neg Hx    Prostate cancer Neg Hx      Current Outpatient Medications:    acetaminophen  (TYLENOL ) 500 MG tablet, Take 1,000 mg by mouth every 6 (six) hours. Rapid release, Disp: , Rfl:    albuterol  (VENTOLIN  HFA) 108 (90 Base) MCG/ACT inhaler, Inhale 2 puffs into the lungs every 6 (six) hours as needed for wheezing or shortness of breath., Disp: 8 g, Rfl: 2   atorvastatin  (LIPITOR) 20 MG tablet, TAKE 1 TABLET BY MOUTH IN THE EVENING FOR CHOLESTEROL, Disp: 90 tablet, Rfl: 2   clopidogrel  (PLAVIX ) 75 MG tablet, TAKE 1 TABLET BY MOUTH EVERY DAY, Disp: 90 tablet, Rfl: 3   ferrous gluconate  (FERGON) 324 MG tablet, TAKE 1 TABLET  BY MOUTH EVERY DAY WITH BREAKFAST, Disp: 30 tablet, Rfl: 0   FIASP  FLEXTOUCH 100 UNIT/ML FlexTouch Pen, Inject 10 units before lunch meal each day., Disp: , Rfl:    FIBER PO, Take 1 capsule by mouth in the morning., Disp: , Rfl:    finasteride  (PROSCAR ) 5 MG tablet, Take 1 tablet (5 mg total) by mouth daily., Disp: 90 tablet, Rfl: 0   Fluticasone -Umeclidin-Vilant (TRELEGY ELLIPTA ) 100-62.5-25 MCG/ACT AEPB, Inhale 1 puff into the lungs daily., Disp: 28 each, Rfl: 11   glipiZIDE  (GLUCOTROL ) 10 MG tablet, Take 10 mg by mouth daily before breakfast., Disp: , Rfl:    GLUCOSAMINE HCL PO, Take 1 tablet by mouth daily., Disp: , Rfl:    insulin  aspart (NOVOLOG ) 100 UNIT/ML injection, Inject 10 Units into the skin once., Disp: , Rfl:    Insulin  Glargine (BASAGLAR KWIKPEN) 100 UNIT/ML, Inject 30 Units into the skin every morning., Disp: , Rfl:    insulin  glargine-yfgn (SEMGLEE) 100 UNIT/ML Pen, SMARTSIG:30 Unit(s) SUB-Q Daily, Disp: , Rfl:    lisinopril  (ZESTRIL ) 30 MG tablet, TAKE 1 TABLET (30 MG TOTAL) BY MOUTH DAILY. FOR BLOOD PRESSURE., Disp: 90 tablet, Rfl: 2   metFORMIN  (GLUCOPHAGE ) 500 MG tablet, TAKE 2 TABLETS BY MOUTH TWICE A DAY WITH MEAL FOR DIABETES (Patient taking differently: Take 1,000 mg by mouth 2 (two) times daily. TAKE 2 TABLETS BY MOUTH TWICE A DAY WITH MEAL FOR DIABETES), Disp: 360 tablet, Rfl: 2   pantoprazole  (PROTONIX ) 40 MG tablet, TAKE 1 TABLET (40 MG TOTAL) BY MOUTH DAILY. FOR HEARTBURN, Disp: 90 tablet, Rfl: 2   sildenafil  (VIAGRA ) 100 MG tablet, Take 1 tablet (100 mg total) by mouth daily as needed for erectile dysfunction., Disp: 30 tablet, Rfl: 0   tamsulosin  (FLOMAX ) 0.4 MG CAPS capsule, TAKE 1 CAPSULE BY MOUTH 2 TIMES DAILY., Disp: 180 capsule, Rfl: 3   vitamin B-12 (  CYANOCOBALAMIN) 1000 MCG tablet, Take 1,000 mcg by mouth in the morning., Disp: , Rfl:  No current facility-administered medications for this visit.  Facility-Administered Medications Ordered in Other Visits:     sodium chloride  flush (NS) 0.9 % injection 10 mL, 10 mL, Intracatheter, PRN, Avonne Boettcher, MD, 10 mL at 01/19/23 1205  Physical exam:  Vitals:   07/06/23 0838  BP: (!) 154/86  Pulse: (!) 43  Resp: 20  Temp: (!) 97.1 F (36.2 C)  SpO2: 100%  Weight: 180 lb (81.6 kg)   Physical Exam Cardiovascular:     Rate and Rhythm: Normal rate and regular rhythm.     Heart sounds: Normal heart sounds.  Pulmonary:     Effort: Pulmonary effort is normal.     Breath sounds: Normal breath sounds.  Abdominal:     General: Bowel sounds are normal.     Palpations: Abdomen is soft.  Skin:    General: Skin is warm and dry.  Neurological:     Mental Status: He is alert and oriented to person, place, and time.      I have personally reviewed labs listed below:    Latest Ref Rng & Units 07/06/2023    8:32 AM  CMP  Glucose 70 - 99 mg/dL 161   BUN 8 - 23 mg/dL 21   Creatinine 0.96 - 1.24 mg/dL 0.45   Sodium 409 - 811 mmol/L 137   Potassium 3.5 - 5.1 mmol/L 4.4   Chloride 98 - 111 mmol/L 108   CO2 22 - 32 mmol/L 20   Calcium  8.9 - 10.3 mg/dL 8.3   Total Protein 6.5 - 8.1 g/dL 6.4   Total Bilirubin 0.0 - 1.2 mg/dL 0.4   Alkaline Phos 38 - 126 U/L 58   AST 15 - 41 U/L 17   ALT 0 - 44 U/L 11       Latest Ref Rng & Units 07/06/2023    8:32 AM  CBC  WBC 4.0 - 10.5 K/uL 6.5   Hemoglobin 13.0 - 17.0 g/dL 91.4   Hematocrit 78.2 - 52.0 % 33.5   Platelets 150 - 400 K/uL 222    I have personally reviewed Radiology images listed below: No images are attached to the encounter.  CT CHEST ABDOMEN PELVIS W CONTRAST Result Date: 06/22/2023 CLINICAL DATA:  Bladder cancer, lung cancer restaging * Tracking Code: BO * EXAM: CT CHEST, ABDOMEN, AND PELVIS WITH CONTRAST TECHNIQUE: Multidetector CT imaging of the chest, abdomen and pelvis was performed following the standard protocol during bolus administration of intravenous contrast. RADIATION DOSE REDUCTION: This exam was performed according to the  departmental dose-optimization program which includes automated exposure control, adjustment of the mA and/or kV according to patient size and/or use of iterative reconstruction technique. CONTRAST:  85mL OMNIPAQUE  IOHEXOL  300 MG/ML  SOLN COMPARISON:  03/09/2023 FINDINGS: CT CHEST FINDINGS Cardiovascular: Right chest port catheter. Aortic atherosclerosis. Cardiomegaly. Three-vessel coronary artery calcifications. No pericardial effusion. Mediastinum/Nodes: Unchanged treated soft tissue about the left hilum (series 2, image 27). No discretely enlarged mediastinal, hilar, or axillary lymph nodes. Thyroid  gland and trachea demonstrate no significant findings. Unchanged circumferential wall thickening of the mid to lower esophagus with fluid and debris throughout (series 2, image 42). Lungs/Pleura: Background of extensive, fine centrilobular ground-glass nodularity throughout the lungs. Diffuse bilateral bronchial wall thickening. Similar appearance of predominantly bandlike fibrotic scarring and volume loss of the perihilar left lung, particularly involving the lingula (series 3, image 60, 70). A discrete perihilar nodule  is difficult to measure against background of post treatment change on today's examination although not obviously changed at 1.4 x 0.9 cm (series 3, image 60). Significantly diminished conspicuity and solid character of ground-glass opacity in the right azygoesophageal recess (series 3, image 80). No pleural effusion or pneumothorax. Musculoskeletal: No chest wall abnormality. No acute osseous findings. CT ABDOMEN PELVIS FINDINGS Hepatobiliary: No solid liver abnormality is seen. No gallstones, gallbladder wall thickening, or biliary dilatation. Pancreas: Unremarkable. No pancreatic ductal dilatation or surrounding inflammatory changes. Spleen: Normal in size without significant abnormality. Adrenals/Urinary Tract: Unchanged, benign macroscopic fat containing left adrenal adenoma. Mild, non nodular  fatty adenomatous thickening of the right adrenal gland. Both findings benign and requiring no specific further follow-up or characterization. Kidneys are normal, without renal calculi, solid lesion, or hydronephrosis. Unchanged mild, diffuse urinary bladder wall thickening without focal mass or suspicious contrast enhancement. Stomach/Bowel: Stomach is within normal limits. Appendix appears normal. No evidence of bowel wall thickening, distention, or inflammatory changes. Vascular/Lymphatic: Severe aortic atherosclerosis. No enlarged abdominal or pelvic lymph nodes. Reproductive: Prostatomegaly. Other: No abdominal wall hernia or abnormality. No ascites. Musculoskeletal: No acute osseous findings. IMPRESSION: 1. Similar appearance of predominantly bandlike fibrotic scarring and volume loss of the perihilar left lung, particularly involving the lingula, as well as treated soft tissue in the left hilum. 2. A discrete perihilar nodule is difficult to measure against background of post treatment change on today's examination although not obviously changed at 1.4 x 0.9 cm. 3. Significantly diminished conspicuity and solid character of ground-glass opacity in the right azygoesophageal recess, likely waning radiation pneumonitis or other nonspecific resolving infection or inflammation. 4. Unchanged circumferential wall thickening of the mid to lower esophagus with fluid and debris throughout, most consistent with reflux and/or radiation esophagitis. 5. Diffuse urinary bladder wall thickening without discrete mass or abnormal contrast enhancement, most likely reflecting chronic outlet obstruction. 6. Prostatomegaly. 7. Coronary artery disease. Aortic Atherosclerosis (ICD10-I70.0). Electronically Signed   By: Fredricka Jenny M.D.   On: 06/22/2023 11:59     Assessment and plan- Patient is a 78 y.o. male with history of locally recurrent stage III non-small cell lung cancer here for on treatment assessment prior to cycle 11  of maintenance durvalumab   Counts okay to proceed with cycle 11 of maintenance durvalumab  today.  I will see him back in 4 weeks for cycle 12 which will be his last cycle for now.  I have reviewed CT chest abdomen Pelvis images independently and discussed findings with the patient which shows overall stable findings with no evidence of recurrent or progressive disease.  Similar appearance of volume loss in the left perihilar lung from prior radiation.  Groundglass opacity in the right lung has decreased.  Plan is to repeat scans every 4 to 6 months and consider restarting durvalumab  upon progression.  He will keep his port in place for now.  Heart rate in the clinic today is 43 but blood pressure is good and patient is asymptomatic.  Continue to monitor   Visit Diagnosis 1. Encounter for antineoplastic immunotherapy   2. Non-small cell cancer of left lung (HCC)      Dr. Seretha Dance, MD, MPH [facility] at [facility] [phone] 07/06/2023 9:14 AM

## 2023-07-23 ENCOUNTER — Ambulatory Visit: Payer: Medicare Other | Admitting: Radiation Oncology

## 2023-07-26 ENCOUNTER — Ambulatory Visit
Admission: RE | Admit: 2023-07-26 | Discharge: 2023-07-26 | Disposition: A | Discharge status: Home / Self Care | Source: Ambulatory Visit | Attending: Radiation Oncology | Admitting: Radiation Oncology

## 2023-07-26 ENCOUNTER — Encounter: Payer: Self-pay | Admitting: Radiation Oncology

## 2023-07-26 VITALS — BP 150/72 | HR 63 | Temp 97.0°F | Resp 16 | Ht 66.0 in | Wt 178.6 lb

## 2023-07-26 DIAGNOSIS — C3412 Malignant neoplasm of upper lobe, left bronchus or lung: Secondary | ICD-10-CM | POA: Insufficient documentation

## 2023-07-26 DIAGNOSIS — Z923 Personal history of irradiation: Secondary | ICD-10-CM | POA: Insufficient documentation

## 2023-07-26 DIAGNOSIS — Z9221 Personal history of antineoplastic chemotherapy: Secondary | ICD-10-CM | POA: Diagnosis not present

## 2023-07-26 DIAGNOSIS — Z87891 Personal history of nicotine dependence: Secondary | ICD-10-CM | POA: Diagnosis not present

## 2023-07-26 NOTE — Progress Notes (Signed)
 Radiation Oncology Follow up Note  Name: Antonio [name]   Date:   07/26/2023 MRN:  [id number] DOB: 1945/08/20    This 78 y.o. male presents to the clinic today for 52-month follow-up status post concurrent chemoradiation therapy for recurrent non-small cell lung cancer of the left upper lobe with mediastinal adenopathy and patient previously treated with SBRT over a year and a half prior of the left upper lobe.  REFERRING PROVIDER: Gabriel Deegan, NP  HPI: Patient is a 78 year old male multiple treatments for lung cancer.  He was treated over a year and a half ago with SBRT to his left upper lobe for stage I non-small cell lung cancer then developed recurrence status post concurrent chemoradiation therapy for locally advanced lung cancer of the left upper lobe with mediastinal involvement.  Seen today in routine follow-up he is doing well specifically denies any change in his pulmonary status does have a mild cough no hemoptysis no chest tightness.  He is having no dysphagia.  He is finishing up.  Maintenance Durvalumab  which he is tolerated well.  He had a recent CT scan of the chest which I have reviewed showing similar appearance of predominantly bandlike fibrotic scarring and volume loss of the left lung particular mildly in the lingula.  He has a discrete perihilar nodule difficult to measure against background although not obviously changed since prior exam 3 months prior.  COMPLICATIONS OF TREATMENT: none  FOLLOW UP COMPLIANCE: keeps appointments   PHYSICAL EXAM:  BP (!) 150/72   Pulse 63   Temp (!) 97 F (36.1 C) (Tympanic)   Resp 16   Ht 5' 6 (1.676 m)   Wt 178 lb 9.6 oz (81 kg)   BMI 28.83 kg/m  Well-developed well-nourished patient in NAD. HEENT reveals PERLA, EOMI, discs not visualized.  Oral cavity is clear. No oral mucosal lesions are identified. Neck is clear without evidence of cervical or supraclavicular adenopathy. Lungs are clear to A&P. Cardiac examination is  essentially unremarkable with regular rate and rhythm without murmur rub or thrill. Abdomen is benign with no organomegaly or masses noted. Motor sensory and DTR levels are equal and symmetric in the upper and lower extremities. Cranial nerves II through XII are grossly intact. Proprioception is intact. No peripheral adenopathy or edema is identified. No motor or sensory levels are noted. Crude visual fields are within normal range.  RADIOLOGY RESULTS: Serial CT scans reviewed compatible with above-stated findings  PLAN: Present time patient is doing well no evidence of progressive disease by CT criteria.  He has tolerated his maintenance Durvalumab  well.  I will see him back in 6 months for follow-up.  Patient knows to call sooner with any concerns.  I would like to take this opportunity to thank you for allowing me to participate in the care of your patient.Glenis Langdon, MD

## 2023-07-30 DIAGNOSIS — E1159 Type 2 diabetes mellitus with other circulatory complications: Secondary | ICD-10-CM | POA: Diagnosis not present

## 2023-08-03 ENCOUNTER — Inpatient Hospital Stay: Attending: Oncology

## 2023-08-03 ENCOUNTER — Encounter: Payer: Self-pay | Admitting: Oncology

## 2023-08-03 ENCOUNTER — Inpatient Hospital Stay

## 2023-08-03 ENCOUNTER — Inpatient Hospital Stay (HOSPITAL_BASED_OUTPATIENT_CLINIC_OR_DEPARTMENT_OTHER): Attending: Oncology | Admitting: Oncology

## 2023-08-03 VITALS — BP 155/72 | HR 69 | Temp 98.8°F | Resp 19 | Ht 66.0 in | Wt 175.4 lb

## 2023-08-03 DIAGNOSIS — Z5112 Encounter for antineoplastic immunotherapy: Secondary | ICD-10-CM

## 2023-08-03 DIAGNOSIS — C3490 Malignant neoplasm of unspecified part of unspecified bronchus or lung: Secondary | ICD-10-CM

## 2023-08-03 DIAGNOSIS — Z87891 Personal history of nicotine dependence: Secondary | ICD-10-CM | POA: Diagnosis not present

## 2023-08-03 DIAGNOSIS — C771 Secondary and unspecified malignant neoplasm of intrathoracic lymph nodes: Secondary | ICD-10-CM | POA: Insufficient documentation

## 2023-08-03 DIAGNOSIS — C3412 Malignant neoplasm of upper lobe, left bronchus or lung: Secondary | ICD-10-CM | POA: Insufficient documentation

## 2023-08-03 DIAGNOSIS — Z79620 Long term (current) use of immunosuppressive biologic: Secondary | ICD-10-CM | POA: Insufficient documentation

## 2023-08-03 LAB — CMP (CANCER CENTER ONLY)
ALT: 11 U/L (ref 0–44)
AST: 18 U/L (ref 15–41)
Albumin: 3.7 g/dL (ref 3.5–5.0)
Alkaline Phosphatase: 60 U/L (ref 38–126)
Anion gap: 9 (ref 5–15)
BUN: 31 mg/dL — ABNORMAL HIGH (ref 8–23)
CO2: 21 mmol/L — ABNORMAL LOW (ref 22–32)
Calcium: 8.7 mg/dL — ABNORMAL LOW (ref 8.9–10.3)
Chloride: 106 mmol/L (ref 98–111)
Creatinine: 1.07 mg/dL (ref 0.61–1.24)
GFR, Estimated: >60 mL/min (ref >60)
Glucose, Bld: 195 mg/dL — ABNORMAL HIGH (ref 70–99)
Potassium: 5 mmol/L (ref 3.5–5.1)
Sodium: 136 mmol/L (ref 135–145)
Total Bilirubin: 0.6 mg/dL (ref 0.0–1.2)
Total Protein: 6.7 g/dL (ref 6.5–8.1)

## 2023-08-03 LAB — CBC WITH DIFFERENTIAL (CANCER CENTER ONLY)
Abs Immature Granulocytes: 0.05 10*3/uL (ref 0.00–0.07)
Basophils Absolute: 0.1 10*3/uL (ref 0.0–0.1)
Basophils Relative: 1 %
Eosinophils Absolute: 0.3 10*3/uL (ref 0.0–0.5)
Eosinophils Relative: 4 %
HCT: 34.8 % — ABNORMAL LOW (ref 39.0–52.0)
Hemoglobin: 11.5 g/dL — ABNORMAL LOW (ref 13.0–17.0)
Immature Granulocytes: 1 %
Lymphocytes Relative: 14 %
Lymphs Abs: 1 10*3/uL (ref 0.7–4.0)
MCH: 28.6 pg (ref 26.0–34.0)
MCHC: 33 g/dL (ref 30.0–36.0)
MCV: 86.6 fL (ref 80.0–100.0)
Monocytes Absolute: 0.5 10*3/uL (ref 0.1–1.0)
Monocytes Relative: 7 %
Neutro Abs: 5.3 10*3/uL (ref 1.7–7.7)
Neutrophils Relative %: 73 %
Platelet Count: 233 10*3/uL (ref 150–400)
RBC: 4.02 MIL/uL — ABNORMAL LOW (ref 4.22–5.81)
RDW: 14.6 % (ref 11.5–15.5)
WBC Count: 7.1 10*3/uL (ref 4.0–10.5)
nRBC: 0 % (ref 0.0–0.2)

## 2023-08-03 LAB — TSH: TSH: 2.431 u[IU]/mL (ref 0.350–4.500)

## 2023-08-03 MED ORDER — HEPARIN SOD (PORK) LOCK FLUSH 100 UNIT/ML IV SOLN
500.0000 [IU] | Freq: Once | INTRAVENOUS | Status: AC | PRN
  Administered 2023-08-03: 500 [IU]
  Filled 2023-08-03: qty 5

## 2023-08-03 MED ORDER — SODIUM CHLORIDE 0.9 % IV SOLN
Freq: Once | INTRAVENOUS | Status: AC
  Filled 2023-08-03: qty 250

## 2023-08-03 MED ORDER — SODIUM CHLORIDE 0.9 % IV SOLN
1500.0000 mg | Freq: Once | INTRAVENOUS | Status: AC
  Administered 2023-08-03: 1500 mg via INTRAVENOUS
  Filled 2023-08-03: qty 30

## 2023-08-03 NOTE — Patient Instructions (Signed)
 [facility] - A DEPT OF [facility]  Discharge Instructions: Thank you for choosing [facility] to provide your oncology and hematology care.  If you have a lab appointment with the Cancer Center, please go directly to the Cancer Center and check in at the registration area.  Wear comfortable clothing and clothing appropriate for easy access to any Portacath or PICC line.   We strive to give you quality time with your provider. You may need to reschedule your appointment if you arrive late (15 or more minutes).  Arriving late affects you and other patients whose appointments are after yours.  Also, if you miss three or more appointments without notifying the office, you may be dismissed from the clinic at the provider's discretion.      For prescription refill requests, have your pharmacy contact our office and allow 72 hours for refills to be completed.    Today you received the following chemotherapy and/or immunotherapy agents IMFINZI      To help prevent nausea and vomiting after your treatment, we encourage you to take your nausea medication as directed.  BELOW ARE SYMPTOMS THAT SHOULD BE REPORTED IMMEDIATELY: *FEVER GREATER THAN 100.4 F (38 C) OR HIGHER *CHILLS OR SWEATING *NAUSEA AND VOMITING THAT IS NOT CONTROLLED WITH YOUR NAUSEA MEDICATION *UNUSUAL SHORTNESS OF BREATH *UNUSUAL BRUISING OR BLEEDING *URINARY PROBLEMS (pain or burning when urinating, or frequent urination) *BOWEL PROBLEMS (unusual diarrhea, constipation, pain near the anus) TENDERNESS IN MOUTH AND THROAT WITH OR WITHOUT PRESENCE OF ULCERS (sore throat, sores in mouth, or a toothache) UNUSUAL RASH, SWELLING OR PAIN  UNUSUAL VAGINAL DISCHARGE OR ITCHING   Items with * indicate a potential emergency and should be followed up as soon as possible or go to the Emergency Department if any problems should occur.  Please show the CHEMOTHERAPY ALERT CARD or IMMUNOTHERAPY  ALERT CARD at check-in to the Emergency Department and triage nurse.  Should you have questions after your visit or need to cancel or reschedule your appointment, please contact [facility] - A DEPT OF [facility]  [phone] and follow the prompts.  Office hours are 8:00 a.m. to 4:30 p.m. Monday - Friday. Please note that voicemails left after 4:00 p.m. may not be returned until the following business day.  We are closed weekends and major holidays. You have access to a nurse at all times for urgent questions. Please call the main number to the clinic [phone] and follow the prompts.  For any non-urgent questions, you may also contact your provider using MyChart. We now offer e-Visits for anyone 30 and older to request care online for non-urgent symptoms. For details visit [email or web address].   Also download the MyChart app! Go to the app store, search "MyChart", open the app, select [facility], and log in with your MyChart username and password.  Durvalumab Injection What is this medication? DURVALUMAB (dur VAL ue mab) treats some types of cancer. It works by helping your immune system slow or stop the spread of cancer cells. It is a monoclonal antibody. This medicine may be used for other purposes; ask your health care provider or pharmacist if you have questions. COMMON BRAND NAME(S): IMFINZI What should I tell my care team before I take this medication? They need to know if you have any of these conditions: Allogeneic stem cell transplant (uses someone else's stem cells) Autoimmune diseases, such as Crohn disease, ulcerative  colitis, lupus History of chest radiation Nervous system problems, such as Guillain-Barre syndrome, myasthenia gravis Organ transplant An unusual or allergic reaction to durvalumab, other medications, foods, dyes, or preservatives Pregnant or trying to get pregnant Breast-feeding How should I use this medication? This  medication is infused into a vein. It is given by your care team in a hospital or clinic setting. A special MedGuide will be given to you before each treatment. Be sure to read this information carefully each time. Talk to your care team about the use of this medication in children. Special care may be needed. Overdosage: If you think you have taken too much of this medicine contact a poison control center or emergency room at once. NOTE: This medicine is only for you. Do not share this medicine with others. What if I miss a dose? Keep appointments for follow-up doses. It is important not to miss your dose. Call your care team if you are unable to keep an appointment. What may interact with this medication? Interactions have not been studied. This list may not describe all possible interactions. Give your health care provider a list of all the medicines, herbs, non-prescription drugs, or dietary supplements you use. Also tell them if you smoke, drink alcohol, or use illegal drugs. Some items may interact with your medicine. What should I watch for while using this medication? Your condition will be monitored carefully while you are receiving this medication. You may need blood work while taking this medication. This medication may cause serious skin reactions. They can happen weeks to months after starting the medication. Contact your care team right away if you notice fevers or flu-like symptoms with a rash. The rash may be red or purple and then turn into blisters or peeling of the skin. You may also notice a red rash with swelling of the face, lips, or lymph nodes in your neck or under your arms. Tell your care team right away if you have any change in your eyesight. Talk to your care team if you may be pregnant. Serious birth defects can occur if you take this medication during pregnancy and for 3 months after the last dose. You will need a negative pregnancy test before starting this medication.  Contraception is recommended while taking this medication and for 3 months after the last dose. Your care team can help you find the option that works for you. Do not breastfeed while taking this medication and for 3 months after the last dose. What side effects may I notice from receiving this medication? Side effects that you should report to your care team as soon as possible: Allergic reactions--skin rash, itching, hives, swelling of the face, lips, tongue, or throat Dry cough, shortness of breath or trouble breathing Eye pain, redness, irritation, or discharge with blurry or decreased vision Heart muscle inflammation--unusual weakness or fatigue, shortness of breath, chest pain, fast or irregular heartbeat, dizziness, swelling of the ankles, feet, or hands Hormone gland problems--headache, sensitivity to light, unusual weakness or fatigue, dizziness, fast or irregular heartbeat, increased sensitivity to cold or heat, excessive sweating, constipation, hair loss, increased thirst or amount of urine, tremors or shaking, irritability Infusion reactions--chest pain, shortness of breath or trouble breathing, feeling faint or lightheaded Kidney injury (glomerulonephritis)--decrease in the amount of urine, red or dark brown urine, foamy or bubbly urine, swelling of the ankles, hands, or feet Liver injury--right upper belly pain, loss of appetite, nausea, light-colored stool, dark yellow or brown urine, yellowing skin or eyes,  unusual weakness or fatigue Pain, tingling, or numbness in the hands or feet, muscle weakness, change in vision, confusion or trouble speaking, loss of balance or coordination, trouble walking, seizures Rash, fever, and swollen lymph nodes Redness, blistering, peeling, or loosening of the skin, including inside the mouth Sudden or severe stomach pain, bloody diarrhea, fever, nausea, vomiting Side effects that usually do not require medical attention (report these to your care team  if they continue or are bothersome): Bone, joint, or muscle pain Diarrhea Fatigue Loss of appetite Nausea Skin rash This list may not describe all possible side effects. Call your doctor for medical advice about side effects. You may report side effects to FDA at 1-800-FDA-1088. Where should I keep my medication? This medication is given in a hospital or clinic. It will not be stored at home. NOTE: This sheet is a summary. It may not cover all possible information. If you have questions about this medicine, talk to your doctor, pharmacist, or health care provider.  2024 [employer]/Gold Standard (2021-06-07 00:00:00)

## 2023-08-03 NOTE — Progress Notes (Signed)
 Hematology/Oncology Consult note [facility]  Telephone:([phone] [phone]  Patient Care Team: Gretta Comer POUR, NP as PCP - General (Internal Medicine) Darliss Rogue, MD as PCP - Cardiology (Cardiology) Fernande Elspeth BROCKS, MD as PCP - Electrophysiology (Cardiology) Helon Clotilda LABOR, PA-C as Physician Assistant (Urology) Aneita Gwendlyn DASEN, MD (Inactive) as Consulting Physician (Gastroenterology) Fate Morna SAILOR, [facility] (Inactive) as Pharmacist (Pharmacist) Verdene Gills, RN as Oncology Nurse Navigator Melanee Annah BROCKS, MD as Consulting Physician (Oncology) Jaye Fallow, MD as Referring Physician (Ophthalmology) Melanee Annah BROCKS, MD as Consulting Physician (Oncology)   Name of the patient: Antonio [name]  [id number]  1945/08/30   Date of visit: 08/03/23  Diagnosis-  locally recurrent adenocarcinoma of the lung   Chief complaint/ Reason for visit-on treatment assessment prior to cycle 12 of maintenance durvalumab   Heme/Onc history:  patient is a 78 year old male who was diagnosed with stage I non-small cell lung cancer involving the left upper lobe about 1 year ago and he received SBRT for the same. He has been undergoing surveillance CT scans since then. CT chest in April 2024 showed increasing areas of disease with left-sided mediastinal and hilar lymph nodes and soft tissue along the left main bronchus. 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 No residual focal hypermetabolism in the treated area of the left upper lobe. No evidence of distant metastatic disease. He underwent MRI brain without contrast in February 2024 which did not show any evidence of metastatic disease. Patient was seen by Dr. Tamea and underwent EBUS guided biopsies left paratracheal lymph node was biopsied and consistent with pulmonary adenocarcinoma.  Patient completed  concurrent chemoradiation with weekly CarboTaxol chemotherapy in July 2024.  Not enough tissue available for NGS testing.  Circulogene assay was sent by pulmonary and peripheral blood NGS testing did not show any evidence of EGFR or ALK mutation    Patient started on maintenance durvalumab  in August 2024  Interval history-he has mild chronic diarrhea which is overall stable.  Denies any new complaints at this time  ECOG PS- 1 Pain scale- 0   Review of systems- Review of Systems  Constitutional:  Negative for chills, fever, malaise/fatigue and weight loss.  HENT:  Negative for congestion, ear discharge and nosebleeds.   Eyes:  Negative for blurred vision.  Respiratory:  Negative for cough, hemoptysis, sputum production, shortness of breath and wheezing.   Cardiovascular:  Negative for chest pain, palpitations, orthopnea and claudication.  Gastrointestinal:  Negative for abdominal pain, blood in stool, constipation, diarrhea, heartburn, melena, nausea and vomiting.  Genitourinary:  Negative for dysuria, flank pain, frequency, hematuria and urgency.  Musculoskeletal:  Negative for back pain, joint pain and myalgias.  Skin:  Negative for rash.  Neurological:  Negative for dizziness, tingling, focal weakness, seizures, weakness and headaches.  Endo/Heme/Allergies:  Does not bruise/bleed easily.  Psychiatric/Behavioral:  Negative for depression and suicidal ideas. The patient does not have insomnia.       No Known Allergies   Past Medical History:  Diagnosis Date   Adenoma of left adrenal gland    Anemia    Aortic atherosclerosis (HCC)    Arthritis    Atrial fibrillation and flutter (HCC)    a.) CHA2DS2VASc = 7 (age x 2, HTN, CVA x 2, vascular disease history, T2DM);  b.) rate/rhythm maintained without pharmacological intervention; chronic antithrombotic therapy using ASA + clopidogrel   Bilateral carotid artery stenosis    a.) s/p PTA 04/07/2022 --> 90% RICA --> 9x7x30 Exact stent;  b.) doppler 03/14/20214: 1-39% RICA, 40-59% LICA   BPH with urinary obstruction    stable on flomax  ([name]   Childhood asthma    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   a.) CT head and MRI brain 04/05/2022: chronic cortical/subcortical RIGHT MCA territory infarct within the RIGHT parietal lobe and RIGHT temporoparietal junction   Coronary atherosclerosis of unspecified type of vessel, native or graft    a.) cCTA 05/22/2021: Ca score 1524 (84th percentile for age/sex match control) --> FFRct: dLAD = 0.77, dLCx = 0.67, RPLA = 0.77, dOM1 = 0.66; b.) LHC 05/23/2021: 20% dRCA, 50% OM1 - med mgmt   COVID-19 virus infection 12/07/2022   Diastolic dysfunction    a.) TTE 04/06/2022: EF 60-65%, mod LVH, mild MR, AoV sclerosis, G1DD   DOE (dyspnea on exertion)    Emphysema of lung (HCC)    Erectile dysfunction    a.) on PDE5i (sildenafil )   Esophageal stricture    Fall    Gastric AVM    Gastritis    GERD (gastroesophageal reflux disease)    Hematoma 05/22/2022   Hiatal hernia    Hx of colonic polyps    Hyperlipidemia    Hypertension    Internal hemorrhoids without mention of complication    Ischemic leg    Long term current use of antithrombotics/antiplatelets    a.) on DAPT (ASA + clopidogrel )   Lumbago    Mobitz (type) I (Wenckebach's) atrioventricular block    Non-small cell lung cancer (HCC)    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   a.) PCR (+) 05/29/2018, 04/07/2022   Pain in limb 12/11/2017   Pneumonia    PSA elevation    now averaging 2's   PVD (peripheral vascular disease) with claudication (HCC)    Sigmoid diverticulosis    Tobacco abuse    Type 2 diabetes mellitus treated with insulin  [facility])      Past Surgical History:  Procedure Laterality Date   2D Echo  07/2001   BACK SURGERY     CAROTID PTA/STENT INTERVENTION Right 04/07/2022   Procedure: CAROTID PTA/STENT INTERVENTION;  Surgeon: Jama Cordella MATSU, MD;  Location: [facility] INVASIVE CV LAB;  Service:  Cardiovascular;  Laterality: Right;   COLONOSCOPY     ENDOBRONCHIAL ULTRASOUND Left 06/19/2022   Procedure: ENDOBRONCHIAL ULTRASOUND;  Surgeon: Tamea Dedra CROME, MD;  Location: [facility] ORS;  Service: Pulmonary;  Laterality: Left;   ESOPHAGOGASTRODUODENOSCOPY  02/13/2007   gastritis and duodenitis without bleed   ESOPHAGOGASTRODUODENOSCOPY N/A 06/26/2014   Procedure: ESOPHAGOGASTRODUODENOSCOPY (EGD);  Surgeon: Toribio SHAUNNA Cedar, MD;  Location: [facility] ENDOSCOPY;  Service: Endoscopy;  Laterality: N/A;   ESOPHAGOGASTRODUODENOSCOPY (EGD) WITH PROPOFOL  N/A 04/08/2020   Procedure: ESOPHAGOGASTRODUODENOSCOPY (EGD) WITH PROPOFOL ;  Surgeon: Aneita Gwendlyn DASEN, MD;  Location: [facility] ENDOSCOPY;  Service: Endoscopy;  Laterality: N/A;   FLEXIBLE BRONCHOSCOPY Left 06/19/2022   Procedure: FLEXIBLE BRONCHOSCOPY;  Surgeon: Tamea Dedra CROME, MD;  Location: [facility] ORS;  Service: Pulmonary;  Laterality: Left;   HOT HEMOSTASIS N/A 04/08/2020   Procedure: HOT HEMOSTASIS (ARGON PLASMA COAGULATION/BICAP);  Surgeon: Aneita Gwendlyn DASEN, MD;  Location: [facility];  Service: Endoscopy;  Laterality: N/A;   IR IMAGING GUIDED PORT INSERTION  06/30/2022   KNEE ARTHROSCOPY Right 11/1998   LEFT HEART CATH AND CORONARY ANGIOGRAPHY N/A 05/23/2021   Procedure: LEFT HEART CATH AND CORONARY ANGIOGRAPHY;  Surgeon: Wendel Lurena POUR, MD;  Location: [facility] INVASIVE  CV LAB;  Service: Cardiovascular;  Laterality: N/A;   LOWER EXTREMITY ANGIOGRAPHY Right 12/27/2017   Procedure: LOWER EXTREMITY ANGIOGRAPHY;  Surgeon: Marea Selinda RAMAN, MD;  Location: [facility] INVASIVE CV LAB;  Service: Cardiovascular;  Laterality: Right;   LOWER EXTREMITY ANGIOGRAPHY Left 01/10/2018   Procedure: LOWER EXTREMITY ANGIOGRAPHY;  Surgeon: Marea Selinda RAMAN, MD;  Location: [facility] INVASIVE CV LAB;  Service: Cardiovascular;  Laterality: Left;   LOWER EXTREMITY ANGIOGRAPHY Left 05/29/2018   Procedure: LOWER EXTREMITY ANGIOGRAPHY;  Surgeon: Marea Selinda RAMAN, MD;  Location: [facility] INVASIVE CV LAB;  Service:  Cardiovascular;  Laterality: Left;   LOWER EXTREMITY ANGIOGRAPHY Left 05/30/2018   Procedure: Lower Extremity Angiography;  Surgeon: Marea Selinda RAMAN, MD;  Location: [facility] INVASIVE CV LAB;  Service: Cardiovascular;  Laterality: Left;   LP  07/2001   Microwave thermotherapy prostate  08/28/2007   [place]   Persantine cardiolite  04/1999   EF 68%   POLYPECTOMY  12/1993   Stress myoview   08/2004   EF 67%   UPPER GASTROINTESTINAL ENDOSCOPY      Social History   Socioeconomic History   Marital status: Widowed    Spouse name: Marshall   Number of children: 3   Years of education: Not on file   Highest education level: Not on file  Occupational History   Occupation: Retired Music therapist  Tobacco Use   Smoking status: Former    Current packs/day: 0.00    Average packs/day: 2.0 packs/day for 57.3 years (114.5 ttl pk-yrs)    Types: Cigarettes    Start date: 02/07/1964    Quit date: 05/11/2021    Years since quitting: 2.2   Smokeless tobacco: Former    Types: Chew   Tobacco comments:    Quit in 2023- khj  Vaping Use   Vaping status: Never Used  Substance and Sexual Activity   Alcohol use: Yes    Comment: rare   Drug use: No   Sexual activity: Yes  Other Topics Concern   Not on file  Social History Narrative   Married 9/09 wife with moderate memory problems.   3 adult children, 2 grandchildren   Would desire CPR   Social Drivers of Health   Financial Resource Strain: Low Risk  (01/25/2023)   Overall Financial Resource Strain (CARDIA)    Difficulty of Paying Living Expenses: Not very hard  Food Insecurity: No Food Insecurity (01/25/2023)   Hunger Vital Sign    Worried About Running Out of Food in the Last Year: Never true    Ran Out of Food in the Last Year: Never true  Transportation Needs: No Transportation Needs (01/25/2023)   PRAPARE - Administrator, Civil Service (Medical): No    Lack of Transportation (Non-Medical): No  Physical Activity: Inactive (01/25/2023)    Exercise Vital Sign    Days of Exercise per Week: 0 days    Minutes of Exercise per Session: 0 min  Stress: No Stress Concern Present (01/25/2023)   [employer] of Occupational Health - Occupational Stress Questionnaire    Feeling of Stress : Only a little  Social Connections: Moderately Isolated (01/25/2023)   Social Connection and Isolation Panel    Frequency of Communication with Friends and Family: Twice a week    Frequency of Social Gatherings with Friends and Family: Once a week    Attends Religious Services: More than 4 times per year    Active Member of [employer] or Organizations: No    Attends Banker Meetings:  Never    Marital Status: Widowed  Intimate Partner Violence: Not At Risk (01/25/2023)   Humiliation, Afraid, Rape, and Kick questionnaire    Fear of Current or Ex-Partner: No    Emotionally Abused: No    Physically Abused: No    Sexually Abused: No    Family History  Problem Relation Age of Onset   Rheum arthritis Mother    Diabetes Father    Alcohol abuse Paternal Uncle    Alcohol abuse Paternal Uncle    Alcohol abuse Paternal Uncle    Heart disease Neg Hx    Stroke Neg Hx    Cancer Neg Hx    Drug abuse Neg Hx    Depression Neg Hx    Colon cancer Neg Hx    Rectal cancer Neg Hx    Stomach cancer Neg Hx    Kidney cancer Neg Hx    Bladder Cancer Neg Hx    Prostate cancer Neg Hx      Current Outpatient Medications:    acetaminophen  (TYLENOL ) 500 MG tablet, Take 1,000 mg by mouth every 6 (six) hours. Rapid release, Disp: , Rfl:    albuterol  (VENTOLIN  HFA) 108 (90 Base) MCG/ACT inhaler, Inhale 2 puffs into the lungs every 6 (six) hours as needed for wheezing or shortness of breath., Disp: 8 g, Rfl: 2   atorvastatin  (LIPITOR) 20 MG tablet, TAKE 1 TABLET BY MOUTH IN THE EVENING FOR CHOLESTEROL, Disp: 90 tablet, Rfl: 2   clopidogrel  (PLAVIX ) 75 MG tablet, TAKE 1 TABLET BY MOUTH EVERY DAY, Disp: 90 tablet, Rfl: 3   ferrous gluconate  (FERGON) 324  MG tablet, TAKE 1 TABLET BY MOUTH EVERY DAY WITH BREAKFAST, Disp: 30 tablet, Rfl: 0   FIASP  FLEXTOUCH 100 UNIT/ML FlexTouch Pen, Inject 10 units before lunch meal each day., Disp: , Rfl:    FIBER PO, Take 1 capsule by mouth in the morning., Disp: , Rfl:    finasteride  (PROSCAR ) 5 MG tablet, Take 1 tablet (5 mg total) by mouth daily., Disp: 90 tablet, Rfl: 0   Fluticasone -Umeclidin-Vilant (TRELEGY ELLIPTA ) 100-62.5-25 MCG/ACT AEPB, Inhale 1 puff into the lungs daily., Disp: 28 each, Rfl: 11   glipiZIDE  (GLUCOTROL ) 10 MG tablet, Take 10 mg by mouth daily before breakfast., Disp: , Rfl:    GLUCOSAMINE HCL PO, Take 1 tablet by mouth daily., Disp: , Rfl:    insulin  aspart (NOVOLOG ) 100 UNIT/ML injection, Inject 10 Units into the skin once., Disp: , Rfl:    Insulin  Glargine (BASAGLAR KWIKPEN) 100 UNIT/ML, Inject 30 Units into the skin every morning., Disp: , Rfl:    insulin  glargine-yfgn (SEMGLEE) 100 UNIT/ML Pen, SMARTSIG:30 Unit(s) SUB-Q Daily, Disp: , Rfl:    lisinopril  (ZESTRIL ) 30 MG tablet, TAKE 1 TABLET (30 MG TOTAL) BY MOUTH DAILY. FOR BLOOD PRESSURE., Disp: 90 tablet, Rfl: 2   metFORMIN  (GLUCOPHAGE ) 500 MG tablet, TAKE 2 TABLETS BY MOUTH TWICE A DAY WITH MEAL FOR DIABETES (Patient taking differently: Take 1,000 mg by mouth 2 (two) times daily. TAKE 2 TABLETS BY MOUTH TWICE A DAY WITH MEAL FOR DIABETES), Disp: 360 tablet, Rfl: 2   pantoprazole  (PROTONIX ) 40 MG tablet, TAKE 1 TABLET (40 MG TOTAL) BY MOUTH DAILY. FOR HEARTBURN, Disp: 90 tablet, Rfl: 2   sildenafil  (VIAGRA ) 100 MG tablet, Take 1 tablet (100 mg total) by mouth daily as needed for erectile dysfunction., Disp: 30 tablet, Rfl: 0   tamsulosin  (FLOMAX ) 0.4 MG CAPS capsule, TAKE 1 CAPSULE BY MOUTH 2 TIMES DAILY., Disp: 180 capsule,  Rfl: 3   vitamin B-12 (CYANOCOBALAMIN) 1000 MCG tablet, Take 1,000 mcg by mouth in the morning., Disp: , Rfl:  No current facility-administered medications for this visit.  Facility-Administered Medications  Ordered in Other Visits:    sodium chloride  flush (NS) 0.9 % injection 10 mL, 10 mL, Intracatheter, PRN, Melanee Annah BROCKS, MD, 10 mL at 01/19/23 1205  Physical exam:  Vitals:   08/03/23 0851  BP: (!) 155/72  Pulse: 69  Resp: 19  Temp: 98.8 F (37.1 C)  TempSrc: Tympanic  SpO2: 98%  Weight: 175 lb 6.4 oz (79.6 kg)  Height: 5' 6 (1.676 m)   Physical Exam  Cardiovascular:     Rate and Rhythm: Normal rate and regular rhythm.     Heart sounds: Normal heart sounds.  Pulmonary:     Effort: Pulmonary effort is normal.     Breath sounds: Normal breath sounds.  Abdominal:     General: Bowel sounds are normal.     Palpations: Abdomen is soft.   Skin:    General: Skin is warm and dry.   Neurological:     Mental Status: He is alert and oriented to person, place, and time.     I have personally reviewed labs listed below:    Latest Ref Rng & Units 07/06/2023    8:32 AM  CMP  Glucose 70 - 99 mg/dL 753   BUN 8 - 23 mg/dL 21   Creatinine 9.38 - 1.24 mg/dL 9.09   Sodium 864 - 854 mmol/L 137   Potassium 3.5 - 5.1 mmol/L 4.4   Chloride 98 - 111 mmol/L 108   CO2 22 - 32 mmol/L 20   Calcium  8.9 - 10.3 mg/dL 8.3   Total Protein 6.5 - 8.1 g/dL 6.4   Total Bilirubin 0.0 - 1.2 mg/dL 0.4   Alkaline Phos 38 - 126 U/L 58   AST 15 - 41 U/L 17   ALT 0 - 44 U/L 11       Latest Ref Rng & Units 07/06/2023    8:32 AM  CBC  WBC 4.0 - 10.5 K/uL 6.5   Hemoglobin 13.0 - 17.0 g/dL 89.2   Hematocrit 60.9 - 52.0 % 33.5   Platelets 150 - 400 K/uL 222     Assessment and plan- Patient is a 77 y.o. male with history of locally recurrent stage III non-small cell lung cancer here for on treatment assessment prior to cycle 12 of maintenance durvalumab   Counts okay to proceed with cycle 12 of maintenance durvalumab  today.  This will be his last cycle.  We will continue to flush his port every 6 to 8 weeks.  His last scan in [id number] did not show any evidence of recurrent or progressive disease and I will  plan to get his repeat scans in mid September 2025 and see him 2 weeks after scans  Bradycardia: Overall Asymptomatic continue to monitor   Visit Diagnosis 1. Encounter for antineoplastic immunotherapy   2. Recurrent non-small cell lung cancer (HCC)      Dr. Annah Melanee, MD, MPH [facility] at [facility] [phone] 08/03/2023 12:25 PM

## 2023-08-04 LAB — T4: T4, Total: 7.4 ug/dL (ref 4.5–12.0)

## 2023-08-06 DIAGNOSIS — E1129 Type 2 diabetes mellitus with other diabetic kidney complication: Secondary | ICD-10-CM | POA: Diagnosis not present

## 2023-08-06 DIAGNOSIS — E1142 Type 2 diabetes mellitus with diabetic polyneuropathy: Secondary | ICD-10-CM | POA: Diagnosis not present

## 2023-08-06 DIAGNOSIS — R809 Proteinuria, unspecified: Secondary | ICD-10-CM | POA: Diagnosis not present

## 2023-08-06 DIAGNOSIS — E1165 Type 2 diabetes mellitus with hyperglycemia: Secondary | ICD-10-CM | POA: Diagnosis not present

## 2023-08-06 DIAGNOSIS — E1159 Type 2 diabetes mellitus with other circulatory complications: Secondary | ICD-10-CM | POA: Diagnosis not present

## 2023-08-06 DIAGNOSIS — I10 Essential (primary) hypertension: Secondary | ICD-10-CM | POA: Diagnosis not present

## 2023-08-06 DIAGNOSIS — Z794 Long term (current) use of insulin: Secondary | ICD-10-CM | POA: Diagnosis not present

## 2023-08-13 DIAGNOSIS — H11002 Unspecified pterygium of left eye: Secondary | ICD-10-CM | POA: Diagnosis not present

## 2023-08-13 DIAGNOSIS — E113393 Type 2 diabetes mellitus with moderate nonproliferative diabetic retinopathy without macular edema, bilateral: Secondary | ICD-10-CM | POA: Diagnosis not present

## 2023-08-13 DIAGNOSIS — H2513 Age-related nuclear cataract, bilateral: Secondary | ICD-10-CM | POA: Diagnosis not present

## 2023-08-13 LAB — HM DIABETES EYE EXAM

## 2023-08-20 ENCOUNTER — Encounter: Payer: Self-pay | Admitting: Podiatry

## 2023-08-20 ENCOUNTER — Ambulatory Visit (INDEPENDENT_AMBULATORY_CARE_PROVIDER_SITE_OTHER): Admitting: Podiatry

## 2023-08-20 DIAGNOSIS — E1151 Type 2 diabetes mellitus with diabetic peripheral angiopathy without gangrene: Secondary | ICD-10-CM

## 2023-08-20 DIAGNOSIS — B351 Tinea unguium: Secondary | ICD-10-CM | POA: Diagnosis not present

## 2023-08-20 DIAGNOSIS — M79675 Pain in left toe(s): Secondary | ICD-10-CM

## 2023-08-20 DIAGNOSIS — M79674 Pain in right toe(s): Secondary | ICD-10-CM | POA: Diagnosis not present

## 2023-08-20 NOTE — Progress Notes (Signed)
  Subjective:  Patient ID: Antonio [name], male    DOB: Oct 27, 1945,  MRN: [id number]  78 y.o. male presents with  Chief Complaint  Patient presents with   Nail Problem    Thick painful toenails, 3 month follow up      PCP: Gretta Comer POUR, NP.  New problem(s): None.   Review of Systems: Negative except as noted in the HPI.   No Known Allergies  Objective:  There were no vitals filed for this visit. Constitutional Patient is a pleasant 78 y.o. African American male in NAD. AAO x 3.  Vascular Capillary fill time to digits <4 seconds.  DP/PT pulse(s) are nonpalpable b/l lower extremities. Pedal hair absent b/l. Lower extremity skin temperature gradient warm to cool b/l. No pain with calf compression b/l. No cyanosis or clubbing noted. No ischemia nor gangrene noted b/l.   Neurologic Pt has subjective symptoms of neuropathy. Protective sensation diminished with 10g monofilament b/l.  Dermatologic Pedal skin is thin, shiny and atrophic b/l.  No open wounds b/l lower extremities. No interdigital macerations b/l lower extremities. Toenails 1-5 b/l elongated, discolored, dystrophic, thickened, crumbly with subungual debris and tenderness to dorsal palpation. No hyperkeratotic nor porokeratotic lesions present on today's visit.  Orthopedic: Normal muscle strength 5/5 to all lower extremity muscle groups bilaterally. Pes planus deformity noted bilateral LE. Utilizes cane for ambulation assistance.   Last HgA1c:      No data to display           Assessment:   1. Pain due to onychomycosis of toenails of both feet   2. Type II diabetes mellitus with peripheral circulatory disorder [facility])    Plan:  Consent given for treatment. Patient examined. All patient's and/or POA's questions/concerns addressed on today's visit. Toenails 1-5 debrided in length and girth without incident. Continue foot and shoe inspections daily. Monitor blood glucose per PCP/Endocrinologist's recommendations. Continue  soft, supportive shoe gear daily. Report any pedal injuries to medical professional. Call office if there are any questions/concerns. -Patient/POA to call should there be question/concern in the interim.  Return in about 3 months (around 11/20/2023).  Delon LITTIE Merlin, DPM      [facility] LOCATION: [address], [place] [address]                   Office [phone]   [facility] LOCATION: [address] [place], [place] [address] Office [phone] Delon LITTIE Merlin, DPM

## 2023-09-14 ENCOUNTER — Inpatient Hospital Stay: Attending: Oncology

## 2023-09-14 DIAGNOSIS — C3412 Malignant neoplasm of upper lobe, left bronchus or lung: Secondary | ICD-10-CM | POA: Insufficient documentation

## 2023-09-14 DIAGNOSIS — Z452 Encounter for adjustment and management of vascular access device: Secondary | ICD-10-CM | POA: Insufficient documentation

## 2023-09-14 DIAGNOSIS — Z87891 Personal history of nicotine dependence: Secondary | ICD-10-CM | POA: Insufficient documentation

## 2023-09-18 NOTE — Progress Notes (Signed)
 Cardiology Clinic Note   Date: 09/19/2023 ID: Antonio [name], DOB 07/30/1945, MRN [id number]  [facility] Providers Cardiologist:  Redell Cave, MD Electrophysiologist:  Elspeth Sage, MD   Chief Complaint   Antonio [name] is a 78 y.o. male who presents to the clinic today for routine follow up.   Patient Profile   Antonio [name] is followed by Dr. Cave for the history outlined below.      Past medical history significant for: Nonobstructive CAD. LHC 05/23/2021 (abnormal coronary CTA): Distal RCA 20%.  OM1 50%. PAD. Lower extremity angiography 12/27/2017: PTA right peroneal artery, right anterior tibial artery, right SFA at the above-knee popliteal artery.  Stent placement right SFA.  PTA right common femoral artery, right external iliac artery. Lower extremity angiography 01/10/2018: PTA left proximal posterior tibial artery, entire left SFA and above-knee popliteal artery.  Stent placement x 2 left SFA and proximal popliteal artery.  PTA left proximal profunda femoris artery. Lower extremity angiography 05/29/2018: PTA left posterior tibial artery, left SFA and popliteal artery.  tPA left SFA popliteal artery. Lower extremity angiography 05/30/2018: Mechanical thrombectomy of the left common femoral artery and superficial femoral arteries.  PTA left common femoral artery and proximal SFA.  Stent placement distal left SFA and proximal popliteal artery. ABIs 10/23/2022: Right ABI indicates mild right lower extremity arterial disease.  Left ABI indicates moderate left lower extremity arterial disease.  TBIs increased compared to prior study on 04/20/2022.  Right ABI appears increased compared to prior study of March 2024.  Left ABIs appear decreased compared to prior study in March 2024. Carotid artery stenosis. PTA stent placement right ICA 04/07/2022. Carotid duplex 10/23/2022: Bilateral ICA 1 to 39% stenosis. Bradycardia/Mobitz type I second-degree AV  block. Hypertension. Hyperlipidemia. Lipid panel 09/26/2022: LDL 36, HDL 42, TG 233, total 124. Gastric AVM. Lung cancer. COPD. GERD. T2DM. CVA. Former tobacco abuse.  In summary, patient was first evaluated by Dr. Cave on 05/13/2021 for preoperative cardiac risk assessment prior to bronchoscopy for recently diagnosed lung mass concerning for carcinoma.  He was felt to be an acceptable risk for noncardiac procedure.  He did note some exertional dyspnea with EKG demonstrating a possible prior infarct.  In this setting echo and Lexiscan were recommended.  He underwent Lexiscan on 05/20/2021 with stress test to be canceled due to symptomatic bradycardia with rates in the 30s.  Resting images demonstrated decreased perfusion in the inferior wall though images are unable to exclude diaphragmatic attenuation artifact.  He was transferred to [facility].  EKG demonstrated Mobitz second-degree AV block type I and first-degree AV block.  Echo 05/20/2021 demonstrated EF 60 to 65%, no RWMA, normal RV size/function, mild LAE, no significant valvular abnormalities.  Coronary CTA showed a calcium  score of 1524 with severe CAD in proximal OM1, LCx, right PLA, proximal D2.  FFR was abnormal in the distal LAD, distal LCx, distal OM1, R PLA.  LHC on 05/23/2021 showed nonobstructive CAD as detailed above.  Medical therapy was recommended.  Patient was evaluated by EP with stress testing EKG consistent with Wenckebach with grouped beating.  He was felt to have chronic bradycardia with no indication for pacing.  He has history of PAD followed by vascular surgery.  He underwent stent placement to right ICA in March 2024 in the setting of CVA.  He also has a history of multiple interventions to the lower extremities as detailed above.  Patient was last seen in the office by Dr. Cave on 07/28/2022  for routine follow-up.  He was doing well at that time with no cardiac concerns.  BP was well-controlled.  No  medication changes were made.  Patient was seen in the office by Chantal Needle, NP on 05/02/2023 for routine EP follow-up.  He was doing well at that time.  He completed chemo and radiation and 4 more immunotherapy infusions to go for lung cancer.  He denied any recent falls.  No changes were made.     History of Present Illness    Today, patient is accompanied by his wife. Patient denies lower extremity edema, orthopnea or PND.  He reports chronic DOE that is unchanged. No chest pain, pressure, or tightness. No palpitations. He has chronic claudication and is followed by vascular surgery. Home SBP typically 145-160 with most readings in the 140s. He reports previously being on another medication to control BP but it was stopped. He denies headaches or dizziness.     ROS: All other systems reviewed and are otherwise negative except as noted in History of Present Illness.  EKGs/Labs Reviewed    EKG Interpretation Date/Time:  Wednesday September 19 2023 14:24:46 EDT Ventricular Rate:  58 PR Interval:    QRS Duration:  78 QT Interval:  392 QTC Calculation: 384 R Axis:   -41  Text Interpretation: Sinus rhythm with 2nd degree A-V block (Mobitz I) Left axis deviation Inferior infarct , age undetermined When compared with ECG of 02-May-2023 09:58, No significant change Confirmed by Loistine Sober [phone]) on 09/19/2023 2:29:09 PM   08/03/2023: ALT 11; AST 18; BUN 31; Creatinine 1.07; Potassium 5.0; Sodium 136   08/03/2023: Hemoglobin 11.5; WBC Count 7.1   08/03/2023: TSH 2.431    Risk Assessment/Calculations      HYPERTENSION CONTROL Vitals:   09/19/23 1418 09/19/23 1522  BP: (!) 170/78 (!) 146/80    The patient's blood pressure is elevated above target today.  In order to address the patient's elevated BP: Blood pressure will be monitored at home to determine if medication changes need to be made.           Physical Exam    VS:  BP (!) 146/80 (BP Location: Right Arm, Patient  Position: Sitting, Cuff Size: Normal)   Pulse (!) 58   Ht 5' 6 (1.676 m)   Wt 178 lb 3.2 oz (80.8 kg)   SpO2 97%   BMI 28.76 kg/m  , BMI Body mass index is 28.76 kg/m.  GEN: Well nourished, well developed, in no acute distress. Neck: No JVD or carotid bruits. Cardiac:  RRR.  No murmur. No rubs or gallops.   Respiratory:  Respirations regular and unlabored. Clear to auscultation without rales, wheezing or rhonchi. GI: Soft, nontender, nondistended. Extremities: Radials 2+ and equal bilaterally. No clubbing or cyanosis. No edema.  Skin: Warm and dry, no rash. Neuro: Strength intact.  Assessment & Plan   Nonobstructive CAD LHC April 2023 showed distal RCA 20%, OM 1 50%.  Patient denies chest pain, pressure or tightness.  - Continue atorvastatin .  Patient is on Plavix  managed by vascular surgery.  PAD/carotid artery stenosis S/p PTA stent placement right ICA March 2024.  Carotid artery duplex September 2024 showed bilateral ICA 1 to 39% stenosis.  S/p multiple interventions to bilateral lower extremities.  ABI September 2024 showed mild right lower extremity arterial disease with moderate left lower extremity arterial disease.  Patient reports chronic claudication. He denies lightheadedness, dizziness, presyncope or syncope.  - Continue atorvastatin , Plavix . - Continue to follow-up with  vascular surgery.  Bradycardia Patient is followed by EP for bradycardia and AV block.  Last evaluated in March 2025 and pacemaker was not indicated.  Patient denies lightheadedness, dizziness, presyncope or syncope.  - Continue to avoid AV nodal blocking agents.  Hypertension BP today 170/78 on intake and 146/80 on my recheck. Home SBP 145-160 but usually in the 140s. No headaches or dizziness.  - Instructed to contact the office if BP is consistently >150/80. Would consider addition of amlodipine .  - Continue lisinopril .  Hyperlipidemia LDL 36 August 2024, at goal. - Continue  atorvastatin .  Disposition: Return in 6 months or sooner as needed.          Signed, Barnie HERO. Gibbs Naugle, DNP, NP-C

## 2023-09-19 ENCOUNTER — Ambulatory Visit: Attending: Student | Admitting: Student

## 2023-09-19 ENCOUNTER — Encounter: Payer: Self-pay | Admitting: Student

## 2023-09-19 VITALS — BP 146/80 | HR 58 | Ht 66.0 in | Wt 178.2 lb

## 2023-09-19 DIAGNOSIS — E785 Hyperlipidemia, unspecified: Secondary | ICD-10-CM

## 2023-09-19 DIAGNOSIS — I2510 Atherosclerotic heart disease of native coronary artery without angina pectoris: Secondary | ICD-10-CM

## 2023-09-19 DIAGNOSIS — I441 Atrioventricular block, second degree: Secondary | ICD-10-CM

## 2023-09-19 DIAGNOSIS — R001 Bradycardia, unspecified: Secondary | ICD-10-CM

## 2023-09-19 DIAGNOSIS — I10 Essential (primary) hypertension: Secondary | ICD-10-CM

## 2023-09-19 NOTE — Patient Instructions (Signed)
 Medication Instructions:  Your physician recommends that you continue on your current medications as directed. Please refer to the Current Medication list given to you today.   *If you need a refill on your cardiac medications before your next appointment, please call your pharmacy*  Lab Work: None ordered at this time  If you have labs (blood work) drawn today and your tests are completely normal, you will receive your results only by: MyChart Message (if you have MyChart) OR A paper copy in the mail If you have any lab test that is abnormal or we need to change your treatment, we will call you to review the results.  Testing/Procedures: None ordered at this time   Follow-Up: At [facility], you and your health needs are our priority.  As part of our continuing mission to provide you with exceptional heart care, our providers are all part of one team.  This team includes your primary Cardiologist (physician) and Advanced Practice Providers or APPs (Physician Assistants and Nurse Practitioners) who all work together to provide you with the care you need, when you need it.  Your next appointment:   6 month(s)  Provider:   Redell Cave, MD or Barnie Hila, NP    We recommend signing up for the patient portal called MyChart.  Sign up information is provided on this After Visit Summary.  MyChart is used to connect with patients for Virtual Visits (Telemedicine).  Patients are able to view lab/test results, encounter notes, upcoming appointments, etc.  Non-urgent messages can be sent to your provider as well.   To learn more about what you can do with MyChart, go to [email or web address].

## 2023-10-19 ENCOUNTER — Other Ambulatory Visit: Payer: Self-pay | Admitting: Primary Care

## 2023-10-19 DIAGNOSIS — K2100 Gastro-esophageal reflux disease with esophagitis, without bleeding: Secondary | ICD-10-CM

## 2023-10-19 DIAGNOSIS — I10 Essential (primary) hypertension: Secondary | ICD-10-CM

## 2023-10-19 NOTE — Telephone Encounter (Signed)
 Patient is due for follow up in mid November, this will be required prior to any further refills.  Please schedule, thank you!

## 2023-10-21 NOTE — Progress Notes (Signed)
 MRN : [id number]  Antonio [name] is a 78 y.o. (11/16/1945) male who presents with chief complaint of check circulation.  History of Present Illness:   The patient is seen for follow up evaluation of carotid stenosis status post right carotid stent on 04/07/2022.     Procedure:  Placement of a 9 mm x 7 mm x 30 mm exact stent with the use of the NAV-6 embolic protection device in the right internal carotid artery    There were no post operative problems or complications related to the surgery.  The patient denies neck or incisional pain.   The patient denies interval amaurosis fugax. There is no recent history of TIA symptoms or focal motor deficits. There is no prior documented CVA.   The patient denies headache.   The patient is taking enteric-coated aspirin  81 mg daily.   The patient is also followed for atherosclerotic occlusive disease bilateral lower extremities.  No recent shortening of the patient's walking distance or new symptoms consistent with claudication.  Specifically he states his right leg is no different than his previous visit no history of rest pain symptoms. No new ulcers or wounds of the lower extremities have occurred.   There is no history of DVT, PE or superficial thrombophlebitis. No recent episodes of angina or shortness of breath documented.   Carotid duplex today's RICA 1-39% and LICA 1-39% successful right stent no evidence of restenosis   ABI's Rt=0.48(TBI=0.19) and Lt=0.82(TBI=0.43)  (previous ABI's Rt=0.86 and Lt=0.77)    No outpatient medications have been marked as taking for the 10/22/23 encounter (Appointment) with Jama, Cordella MATSU, MD.    Past Medical History:  Diagnosis Date   Adenoma of left adrenal gland    Anemia    Aortic atherosclerosis (HCC)    Arthritis    Atrial fibrillation and flutter (HCC)    a.) CHA2DS2VASc = 7 (age x 2, HTN, CVA x 2, vascular disease  history, T2DM);  b.) rate/rhythm maintained without pharmacological intervention; chronic antithrombotic therapy using ASA + clopidogrel    Bilateral carotid artery stenosis    a.) s/p PTA 04/07/2022 --> 90% RICA --> 9x7x30 Exact stent; b.) doppler 03/14/20214: 1-39% RICA, 40-59% LICA   BPH with urinary obstruction    stable on flomax  ([name]   Childhood asthma    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   a.) CT head and MRI brain 04/05/2022: chronic cortical/subcortical RIGHT MCA territory infarct within the RIGHT parietal lobe and RIGHT temporoparietal junction   Coronary atherosclerosis of unspecified type of vessel, native or graft    a.) cCTA 05/22/2021: Ca score 1524 (84th percentile for age/sex match control) --> FFRct: dLAD = 0.77, dLCx = 0.67, RPLA = 0.77, dOM1 = 0.66; b.) LHC 05/23/2021: 20% dRCA, 50% OM1 - med mgmt   COVID-19 virus infection 12/07/2022   Diastolic dysfunction    a.) TTE 04/06/2022: EF 60-65%, mod LVH, mild MR, AoV sclerosis, G1DD   DOE (dyspnea on exertion)    Emphysema of lung (HCC)    Erectile dysfunction    a.) on PDE5i (sildenafil )   Esophageal stricture  Fall    Gastric AVM    Gastritis    GERD (gastroesophageal reflux disease)    Hematoma 05/22/2022   Hiatal hernia    Hx of colonic polyps    Hyperlipidemia    Hypertension    Internal hemorrhoids without mention of complication    Ischemic leg    Long term current use of antithrombotics/antiplatelets    a.) on DAPT (ASA + clopidogrel )   Lumbago    Mobitz (type) I 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)     Past Surgical History:  Procedure  Laterality Date   2D Echo  07/2001   BACK SURGERY     CAROTID PTA/STENT INTERVENTION Right 04/07/2022   Procedure: CAROTID PTA/STENT INTERVENTION;  Surgeon: Jama Cordella MATSU, MD;  Location: [facility] INVASIVE CV LAB;  Service: Cardiovascular;  Laterality: Right;   COLONOSCOPY     ENDOBRONCHIAL ULTRASOUND Left 06/19/2022   Procedure: ENDOBRONCHIAL ULTRASOUND;  Surgeon: Tamea Dedra CROME, MD;  Location: [facility] ORS;  Service: Pulmonary;  Laterality: Left;   ESOPHAGOGASTRODUODENOSCOPY  02/13/2007   gastritis and duodenitis without bleed   ESOPHAGOGASTRODUODENOSCOPY N/A 06/26/2014   Procedure: ESOPHAGOGASTRODUODENOSCOPY (EGD);  Surgeon: Toribio SHAUNNA Cedar, MD;  Location: [facility] ENDOSCOPY;  Service: Endoscopy;  Laterality: N/A;   ESOPHAGOGASTRODUODENOSCOPY (EGD) WITH PROPOFOL  N/A 04/08/2020   Procedure: ESOPHAGOGASTRODUODENOSCOPY (EGD) WITH PROPOFOL ;  Surgeon: Aneita Gwendlyn DASEN, MD;  Location: [facility] ENDOSCOPY;  Service: Endoscopy;  Laterality: N/A;   FLEXIBLE BRONCHOSCOPY Left 06/19/2022   Procedure: FLEXIBLE BRONCHOSCOPY;  Surgeon: Tamea Dedra CROME, MD;  Location: [facility] ORS;  Service: Pulmonary;  Laterality: Left;   HOT HEMOSTASIS N/A 04/08/2020   Procedure: HOT HEMOSTASIS (ARGON PLASMA COAGULATION/BICAP);  Surgeon: Aneita Gwendlyn DASEN, MD;  Location: [facility];  Service: Endoscopy;  Laterality: N/A;   IR IMAGING GUIDED PORT INSERTION  06/30/2022   KNEE ARTHROSCOPY Right 11/1998   LEFT HEART CATH AND CORONARY ANGIOGRAPHY N/A 05/23/2021   Procedure: LEFT HEART CATH AND CORONARY ANGIOGRAPHY;  Surgeon: Wendel Lurena POUR, MD;  Location: [facility] INVASIVE CV LAB;  Service: Cardiovascular;  Laterality: N/A;   LOWER EXTREMITY ANGIOGRAPHY Right 12/27/2017   Procedure: LOWER EXTREMITY ANGIOGRAPHY;  Surgeon: Marea Selinda RAMAN, MD;  Location: [facility] INVASIVE CV LAB;  Service: Cardiovascular;  Laterality: Right;   LOWER EXTREMITY ANGIOGRAPHY Left 01/10/2018   Procedure: LOWER EXTREMITY ANGIOGRAPHY;  Surgeon: Marea Selinda RAMAN, MD;  Location:  [facility] INVASIVE CV LAB;  Service: Cardiovascular;  Laterality: Left;   LOWER EXTREMITY ANGIOGRAPHY Left 05/29/2018   Procedure: LOWER EXTREMITY ANGIOGRAPHY;  Surgeon: Marea Selinda RAMAN, MD;  Location: [facility] INVASIVE CV LAB;  Service: Cardiovascular;  Laterality: Left;   LOWER EXTREMITY ANGIOGRAPHY Left 05/30/2018   Procedure: Lower Extremity Angiography;  Surgeon: Marea Selinda RAMAN, MD;  Location: [facility] INVASIVE CV LAB;  Service: Cardiovascular;  Laterality: Left;   LP  07/2001   Microwave thermotherapy prostate  08/28/2007   [place]   Persantine cardiolite  04/1999   EF 68%   POLYPECTOMY  12/1993   Stress myoview   08/2004   EF 67%   UPPER GASTROINTESTINAL ENDOSCOPY      Social History  Social History   Tobacco Use   Smoking status: Former    Current packs/day: 0.00    Average packs/day: 2.0 packs/day for 57.3 years (114.5 ttl pk-yrs)    Types: Cigarettes    Start date: 02/07/1964    Quit date: 05/11/2021    Years since quitting: 2.4   Smokeless tobacco: Former    Types: Chew   Tobacco comments:    Quit in 2023- khj  Vaping Use   Vaping status: Never Used  Substance Use Topics   Alcohol use: Yes    Comment: rare   Drug use: No    Family History Family History  Problem Relation Age of Onset   Rheum arthritis Mother    Diabetes Father    Alcohol abuse Paternal Uncle    Alcohol abuse Paternal Uncle    Alcohol abuse Paternal Uncle    Heart disease Neg Hx    Stroke Neg Hx    Cancer Neg Hx    Drug abuse Neg Hx    Depression Neg Hx    Colon cancer Neg Hx    Rectal cancer Neg Hx    Stomach cancer Neg Hx    Kidney cancer Neg Hx    Bladder Cancer Neg Hx    Prostate cancer Neg Hx     No Known Allergies   REVIEW OF SYSTEMS (Negative unless checked)  Constitutional: [] Weight loss  [] Fever  [] Chills Cardiac: [] Chest pain   [] Chest pressure   [] Palpitations   [] Shortness of breath when laying flat   [] Shortness of breath with exertion. Vascular:  [x] Pain in legs with walking   [] Pain  in legs at rest  [] History of DVT   [] Phlebitis   [] Swelling in legs   [] Varicose veins   [] Non-healing ulcers Pulmonary:   [] Uses home oxygen   [] Productive cough   [] Hemoptysis   [] Wheeze  [] COPD   [] Asthma Neurologic:  [] Dizziness   [] Seizures   [] History of stroke   [] History of TIA  [] Aphasia   [] Vissual changes   [] Weakness or numbness in arm   [] Weakness or numbness in leg Musculoskeletal:   [] Joint swelling   [] Joint pain   [] Low back pain Hematologic:  [] Easy bruising  [] Easy bleeding   [] Hypercoagulable state   [] Anemic Gastrointestinal:  [] Diarrhea   [] Vomiting  [] Gastroesophageal reflux/heartburn   [] Difficulty swallowing. Genitourinary:  [] Chronic kidney disease   [] Difficult urination  [] Frequent urination   [] Blood in urine Skin:  [] Rashes   [] Ulcers  Psychological:  [] History of anxiety   []  History of major depression.  Physical Examination  There were no vitals filed for this visit. There is no height or weight on file to calculate BMI. Gen: WD/WN, NAD Head: [place]/AT, No temporalis wasting.  Ear/Nose/Throat: Hearing grossly intact, nares w/o erythema or drainage Eyes: PER, EOMI, sclera nonicteric.  Neck: Supple, no masses.  No bruit or JVD.  Pulmonary:  Good air movement, no audible wheezing, no use of accessory muscles.  Cardiac: RRR, normal S1, S2, no Murmurs. Vascular:  mild trophic changes, no open wounds Vessel Right Left  Radial Palpable Palpable  PT Not Palpable Not Palpable  DP Not Palpable Not Palpable  Gastrointestinal: soft, non-distended. No guarding/no peritoneal signs.  Musculoskeletal: M/S 5/5 throughout.  No visible deformity.  Neurologic: CN 2-12 intact. Pain and light touch intact in extremities.  Symmetrical.  Speech is fluent. Motor exam as listed above. Psychiatric: Judgment intact, Mood & affect appropriate for pt's clinical situation. Dermatologic: No rashes or ulcers noted.  No changes consistent with cellulitis.   CBC Lab Results  Component  Value Date   WBC 7.1 08/03/2023   HGB 11.5 (L) 08/03/2023   HCT 34.8 (L) 08/03/2023   MCV 86.6 08/03/2023   PLT 233 08/03/2023    BMET    Component Value Date/Time   NA 136 08/03/2023 0832   NA 141 07/10/2014 1025   K 5.0 08/03/2023 0832   CL 106 08/03/2023 0832   CO2 21 (L) 08/03/2023 0832   GLUCOSE 195 (H) 08/03/2023 0832   BUN 31 (H) 08/03/2023 0832   BUN 12 07/10/2014 1025   CREATININE 1.07 08/03/2023 0832   CALCIUM  8.7 (L) 08/03/2023 0832   GFRNONAA >60 08/03/2023 0832   GFRAA >60 05/31/2018 0529   CrCl cannot be calculated (Patient's most recent lab result is older than the maximum 21 days allowed.).  COAG Lab Results  Component Value Date   INR 1.0 04/06/2022   INR 1.1 04/05/2022   INR 1.1 05/29/2018    Radiology No results found.   Assessment/Plan 1. Bilateral carotid artery stenosis (Primary) Recommend:   Given the patient's asymptomatic subcritical stenosis no further invasive testing or surgery at this time.   Duplex ultrasound shows 1-39% stenosis bilaterally.   Continue antiplatelet therapy as prescribed Continue management of CAD, HTN and Hyperlipidemia Healthy heart diet,  encouraged exercise at least 4 times per week Follow up in 24 months with duplex ultrasound and physical exam hey WhatsApp okay  2. PAD (peripheral artery disease) (HCC) Recommend:   The patient has evidence of atherosclerosis of the lower extremities with claudication.  The patient does not voice lifestyle limiting changes at this point in time.   Noninvasive studies do suggest a change on the right.  However, the patient remains without increased symptoms.  No complaints of rest pain at this time and there is no tissue loss.   No invasive studies, angiography or surgery at this time The patient should continue walking and begin a more formal exercise program.  The patient should continue antiplatelet therapy and aggressive treatment of the lipid abnormalities   No  changes in the patient's medications at this time   Continued surveillance is indicated as atherosclerosis is likely to progress with time.     The patient will continue follow up with noninvasive studies as ordered.  We will obtain his next ABI in 6 months rather than a year given the findings on noninvasive studies at this visit.  He is also encouraged to call sooner if he notes any changes in his right lower extremity or foot symptoms.  He acknowledges this - VAS US  ABI WITH/WO TBI; Future  3. Atherosclerosis of native coronary artery of native heart without angina pectoris Continue cardiac and antihypertensive medications as already ordered and reviewed, no changes at this time.  Continue statin as ordered and reviewed, no changes at this time  Nitrates PRN for chest pain  4. Essential hypertension Continue antihypertensive medications as already ordered, these medications have been reviewed and there are no changes at this time.  5. Type 2 diabetes mellitus with hyperglycemia, with long-term current use of insulin  (HCC) Continue hypoglycemic medications as already ordered, these medications have been reviewed and there are no changes at this time.  Hgb A1C to be monitored as already arranged by primary service    Cordella Shawl, MD  10/21/2023 4:03 PM

## 2023-10-22 ENCOUNTER — Ambulatory Visit (INDEPENDENT_AMBULATORY_CARE_PROVIDER_SITE_OTHER): Payer: Medicare Other

## 2023-10-22 ENCOUNTER — Ambulatory Visit (INDEPENDENT_AMBULATORY_CARE_PROVIDER_SITE_OTHER): Payer: Medicare Other | Admitting: Vascular Surgery

## 2023-10-22 ENCOUNTER — Encounter (INDEPENDENT_AMBULATORY_CARE_PROVIDER_SITE_OTHER): Payer: Self-pay | Admitting: Vascular Surgery

## 2023-10-22 VITALS — BP 140/68 | HR 73 | Ht 66.0 in | Wt 178.0 lb

## 2023-10-22 DIAGNOSIS — I739 Peripheral vascular disease, unspecified: Secondary | ICD-10-CM | POA: Diagnosis not present

## 2023-10-22 DIAGNOSIS — I6523 Occlusion and stenosis of bilateral carotid arteries: Secondary | ICD-10-CM

## 2023-10-22 DIAGNOSIS — Z794 Long term (current) use of insulin: Secondary | ICD-10-CM

## 2023-10-22 DIAGNOSIS — I2510 Atherosclerotic heart disease of native coronary artery without angina pectoris: Secondary | ICD-10-CM | POA: Diagnosis not present

## 2023-10-22 DIAGNOSIS — E1165 Type 2 diabetes mellitus with hyperglycemia: Secondary | ICD-10-CM

## 2023-10-22 DIAGNOSIS — I10 Essential (primary) hypertension: Secondary | ICD-10-CM | POA: Diagnosis not present

## 2023-10-24 LAB — VAS US ABI WITH/WO TBI
Left ABI: 0.82
Right ABI: 0.48

## 2023-10-25 ENCOUNTER — Other Ambulatory Visit: Payer: Self-pay | Admitting: Primary Care

## 2023-10-25 DIAGNOSIS — E782 Mixed hyperlipidemia: Secondary | ICD-10-CM

## 2023-10-26 ENCOUNTER — Ambulatory Visit
Admission: RE | Admit: 2023-10-26 | Discharge: 2023-10-26 | Disposition: A | Discharge status: Home / Self Care | Source: Ambulatory Visit | Attending: Oncology | Admitting: Oncology

## 2023-10-26 ENCOUNTER — Other Ambulatory Visit: Payer: Self-pay | Admitting: Urology

## 2023-10-26 ENCOUNTER — Inpatient Hospital Stay: Attending: Oncology

## 2023-10-26 DIAGNOSIS — C3490 Malignant neoplasm of unspecified part of unspecified bronchus or lung: Secondary | ICD-10-CM | POA: Insufficient documentation

## 2023-10-26 DIAGNOSIS — I700 Atherosclerosis of aorta: Secondary | ICD-10-CM | POA: Diagnosis not present

## 2023-10-26 DIAGNOSIS — N138 Other obstructive and reflux uropathy: Secondary | ICD-10-CM

## 2023-10-26 LAB — POCT I-STAT CREATININE: Creatinine, Ser: 1 mg/dL (ref 0.61–1.24)

## 2023-10-26 MED ORDER — IOHEXOL 300 MG/ML  SOLN
85.0000 mL | Freq: Once | INTRAMUSCULAR | Status: AC | PRN
  Administered 2023-10-26: 85 mL via INTRAVENOUS

## 2023-10-26 MED ORDER — HEPARIN SOD (PORK) LOCK FLUSH 100 UNIT/ML IV SOLN
500.0000 [IU] | Freq: Once | INTRAVENOUS | Status: AC
  Administered 2023-10-26: 500 [IU] via INTRAVENOUS

## 2023-10-30 ENCOUNTER — Other Ambulatory Visit: Payer: Self-pay | Admitting: Primary Care

## 2023-10-30 DIAGNOSIS — I10 Essential (primary) hypertension: Secondary | ICD-10-CM

## 2023-10-30 MED ORDER — LISINOPRIL 30 MG PO TABS: 30.0000 mg | ORAL_TABLET | Freq: Every day | ORAL | 0 refills | Status: DC

## 2023-10-30 NOTE — Telephone Encounter (Unsigned)
 Copied from CRM #[id number]. Topic: Clinical - Medication Refill >> Oct 30, 2023 10:30 AM Rea C wrote: Medication: lisinopril  (ZESTRIL ) 30 MG tablet  Has the patient contacted their pharmacy? [facility] that [facility] will not refill [facility] medication unless he has an appointment with his provider. Patient's next appointment is not until November. Patient is asking if a refill can be sent in until his November appointment.   This is the patient's preferred pharmacy:  [facility] [phone] [facility] [place] [place] - [address] [place] [address] Phone: [phone] Fax: [phone]  Is this the correct pharmacy for this prescription? Yes If no, delete pharmacy and type the correct one.   Has the prescription been filled recently? Yes  Is the patient out of the medication? Yes  Has the patient been seen for an appointment in the last year OR does the patient have an upcoming appointment? Yes, November   Can we respond through MyChart? Yes  Agent: Please be advised that Rx refills may take up to 3 business days. We ask that you follow-up with your pharmacy.

## 2023-11-01 DIAGNOSIS — E1165 Type 2 diabetes mellitus with hyperglycemia: Secondary | ICD-10-CM | POA: Diagnosis not present

## 2023-11-08 DIAGNOSIS — E1165 Type 2 diabetes mellitus with hyperglycemia: Secondary | ICD-10-CM | POA: Diagnosis not present

## 2023-11-08 DIAGNOSIS — C3490 Malignant neoplasm of unspecified part of unspecified bronchus or lung: Secondary | ICD-10-CM | POA: Diagnosis not present

## 2023-11-08 DIAGNOSIS — E1129 Type 2 diabetes mellitus with other diabetic kidney complication: Secondary | ICD-10-CM | POA: Diagnosis not present

## 2023-11-08 DIAGNOSIS — E1142 Type 2 diabetes mellitus with diabetic polyneuropathy: Secondary | ICD-10-CM | POA: Diagnosis not present

## 2023-11-08 DIAGNOSIS — I10 Essential (primary) hypertension: Secondary | ICD-10-CM | POA: Diagnosis not present

## 2023-11-08 DIAGNOSIS — R809 Proteinuria, unspecified: Secondary | ICD-10-CM | POA: Diagnosis not present

## 2023-11-08 DIAGNOSIS — E1159 Type 2 diabetes mellitus with other circulatory complications: Secondary | ICD-10-CM | POA: Diagnosis not present

## 2023-11-08 DIAGNOSIS — Z794 Long term (current) use of insulin: Secondary | ICD-10-CM | POA: Diagnosis not present

## 2023-11-08 NOTE — Progress Notes (Signed)
 78 y.o. male seen in follow-up. He was last seen on 08/06/2023.    1. Diabetes. His Hb A1c is 8.6%. He has a FreeStyle Libre 3 CGM and manual reader, which was downloaded for review today. Over the last 14 days, his average sugar was 152 mg/dl. He is at target (70 - 180 mg/dl) 26% of the time, and above target 27%. No hypoglycemia. Sensor usage is 97% of the time. Pattern shows his sugars are modestly high from 6pm - 12am. Lowest sugars are from 9am - 1pm. He is now taking Basaglar insulin  30 units daily in the mornings and metformin  1000 mg BID.   At last visit, he told us  that Fiasp  was denied. I then changed him to NovoLog , which he tells me was denied at his pharmacy over 2 months ago. He has not been taking any fast-acting insulins. Mounjaro was prescribed in 01/2023 and was not filled due to cost.  He complains of high cost of glargine insulin . He is charged a two month copay ($70) per box.  His diabetes is complicated by microalbuminuria, peripheral neuropathy, and vascular disease. Last dilated eye exam was at [facility] in 08/2023.     2. Hypertension. Controlled. Taking ACE-I daily and hydralazine  PRN.    3. Hyperlipidemia. Controlled. Taking and tolerating atorvastatin .  4. Lung cancer. He has a history of lung cancer diagnosed in 06/2021. He follows Dr. Lenn with radiation oncology. He completed radiation therapy. Last CT showed that the lung mass had resolved. He is now on immunotherapy for lung cancer.   Past Medical History:  Diagnosis Date  . Aortic atherosclerosis   . B12 deficiency 03/2018  . CAD (coronary artery disease)   . Hyperlipidemia   . Hypertension   . Left club foot    congenital s/p repair  . Microalbuminuria due to type 2 diabetes mellitus (CMS/HHS-HCC)   . Non-small cell lung cancer (CMS/HHS-HCC)    Left upper lobe  . PAD (peripheral artery disease)    s/p angioplasty and stents to b/l femoral arteries in 12/2017 and 01/2018 (Dr Marea)  . Tobacco  dependence   . Type 2 diabetes mellitus (CMS/HHS-HCC)    on long-term insulin     Outpatient Medications Marked as Taking for the 11/08/23 encounter (Office Visit) with Solum, Anna Melissa, MD  Medication Sig Dispense Refill  . atorvastatin  (LIPITOR) 20 MG tablet Take 20 mg by mouth nightly.    . clopidogreL  (PLAVIX ) 75 mg tablet Take by mouth    . cyanocobalamin (VITAMIN B12) 1000 MCG tablet Take 1,000 mcg by mouth once daily    . ferrous gluconate  (FERGON) 324 MG tablet Take 1 tablet by mouth daily with breakfast    . finasteride  (PROSCAR ) 5 mg tablet Take 5 mg by mouth once daily.    . insulin  glargine-yfgn 100 unit/mL (3 mL) InPn Inject 30 Units subcutaneously once daily 15 mL 5  . lisinopriL  (ZESTRIL ) 30 MG tablet Take 30 mg by mouth    . metFORMIN  (GLUCOPHAGE ) 500 MG tablet Take 2 tablets (1,000 mg total) by mouth 2 (two) times daily 360 tablet 3  . pantoprazole  (PROTONIX ) 40 MG DR tablet Take 40 mg by mouth once daily.    . tamsulosin  (FLOMAX ) 0.4 mg capsule Take 0.4 mg by mouth 2 (two) times daily       . TRELEGY ELLIPTA  100-62.5-25 mcg inhaler Inhale 1 Puff into the lungs once daily      Exam BP (!) 142/68   Pulse 59  Ht 167.6 cm (5' 6)   Wt 80.3 kg (177 lb)   SpO2 100%   BMI 28.57 kg/m   GEN: well developed male in NAD. PSYC: alert and oriented, good insight.   Labs Appointment on 11/01/2023  Component Date Value Ref Range Status  . Glucose 11/01/2023 157 (H)  70 - 110 mg/dL Final  . Sodium 90/74/7974 142  136 - 145 mmol/L Final  . Potassium 11/01/2023 4.6  3.6 - 5.1 mmol/L Final  . Chloride 11/01/2023 109  97 - 109 mmol/L Final  . Carbon Dioxide (CO2) 11/01/2023 26.7  22.0 - 32.0 mmol/L Final  . Calcium  11/01/2023 8.8  8.7 - 10.3 mg/dL Final  . Urea  Nitrogen (BUN) 11/01/2023 23  7 - 25 mg/dL Final  . Creatinine 90/74/7974 0.9  0.7 - 1.3 mg/dL Final  . Glomerular Filtration Rate (eGFR) 11/01/2023 87  >60 mL/min/1.73sq m Final   CKD-EPI (2021) does not include  patient's race in the calculation of eGFR.  Monitoring changes of plasma creatinine and eGFR over time is useful for monitoring kidney function.   Interpretive Ranges for eGFR (CKD-EPI 2021):  eGFR:       >60 mL/min/1.73 sq. m - Normal eGFR:       30-59 mL/min/1.73 sq. m - Moderately Decreased eGFR:       15-29 mL/min/1.73 sq. m  - Severely Decreased eGFR:       < 15 mL/min/1.73 sq. m  - Kidney Failure    Note: These eGFR calculations do not apply in acute situations when eGFR is changing rapidly or patients on dialysis.  SABRA Dire Ratio 11/01/2023 25.6 (H)  6.0 - 20.0 Final  . Anion Gap w/K 11/01/2023 10.9  6.0 - 16.0 Final  . Hemoglobin A1C 11/01/2023 8.6 (H)  4.2 - 5.6 % Final  . Average Blood Glucose (Calc) 11/01/2023 200  mg/dL Final    Assessment 1. Poorly controlled type 2 diabetes mellitus (CMS/HHS-HCC)   2. Type 2 diabetes mellitus with vascular disease (CMS/HHS-HCC)   3. DM type 2 with diabetic peripheral neuropathy (CMS/HHS-HCC)   4. Type 2 diabetes mellitus with microalbuminuria, with long-term current use of insulin  (CMS/HHS-HCC)   5. Essential (primary) hypertension   6. Malignant neoplasm of lung, unspecified laterality, unspecified part of lung (CMS/HHS-HCC)     Plan - Diabetes is uncontrolled. He has been unable to get back started on a fast-acting insulin . I am aware that Fiasp  was denied by his Health Plan in June 2025. He now tells me that the plan denied NovoLog  as well. I am not sure what the Health Plan will cover. Will ask staff to do PA for NovoLog  and hopefully we can get some answers about his covered insulins and get his restarted on a fast acting insulin  8 units before breakfast AND lunch. - Continue glargine 30 units daily (however wrote prescription as 50 units daily to reduce his cost to one month copay per box). - Continue monitoring sugars through use of FreeStyle Libre 3 CGM as he is doing. Reminded him to bring meter/receiver to each follow up  visit. - Hypertension is controlled. Continue ACE-I. - Continue statin.   - Continue annual eye exams.   - Follow up in 2 months   Addendum I assisted staff to complete a PA for NovoLog .   I spent a total of 42 minutes in both face-to-face and non-face-to-face activities, excluding procedures performed, for this visit on the date of this encounter.

## 2023-11-09 ENCOUNTER — Encounter: Payer: Self-pay | Admitting: Oncology

## 2023-11-09 ENCOUNTER — Inpatient Hospital Stay: Attending: Oncology | Admitting: Oncology

## 2023-11-09 DIAGNOSIS — E119 Type 2 diabetes mellitus without complications: Secondary | ICD-10-CM | POA: Diagnosis not present

## 2023-11-09 DIAGNOSIS — C3411 Malignant neoplasm of upper lobe, right bronchus or lung: Secondary | ICD-10-CM | POA: Diagnosis not present

## 2023-11-09 DIAGNOSIS — C3412 Malignant neoplasm of upper lobe, left bronchus or lung: Secondary | ICD-10-CM | POA: Diagnosis not present

## 2023-11-09 DIAGNOSIS — Z08 Encounter for follow-up examination after completed treatment for malignant neoplasm: Secondary | ICD-10-CM

## 2023-11-09 DIAGNOSIS — Z923 Personal history of irradiation: Secondary | ICD-10-CM | POA: Diagnosis not present

## 2023-11-09 DIAGNOSIS — Z87891 Personal history of nicotine dependence: Secondary | ICD-10-CM | POA: Insufficient documentation

## 2023-11-09 DIAGNOSIS — Z452 Encounter for adjustment and management of vascular access device: Secondary | ICD-10-CM | POA: Insufficient documentation

## 2023-11-09 DIAGNOSIS — Z79899 Other long term (current) drug therapy: Secondary | ICD-10-CM | POA: Insufficient documentation

## 2023-11-09 NOTE — Progress Notes (Signed)
 Patient doing well, only concern is his scan results.

## 2023-11-09 NOTE — Progress Notes (Signed)
 Hematology/Oncology Consult note [facility]  [phone] [phone]  Patient Care Team: Gretta Comer POUR, NP as PCP - General (Internal Medicine) Darliss Rogue, MD as PCP - Cardiology (Cardiology) Fernande Elspeth BROCKS, MD as PCP - Electrophysiology (Cardiology) Helon Clotilda LABOR, PA-C as Physician Assistant (Urology) Aneita Gwendlyn DASEN, MD (Inactive) as Consulting Physician (Gastroenterology) Fate Morna SAILOR, [facility] (Inactive) as Pharmacist (Pharmacist) Verdene Gills, RN as Oncology Nurse Navigator Melanee Annah BROCKS, MD as Consulting Physician (Oncology) Jaye Fallow, MD as Referring Physician (Ophthalmology) Melanee Annah BROCKS, MD as Consulting Physician (Oncology)   Name of the patient: Antonio [name]  [id number]  15-Jul-1945   Date of visit: 11/09/23  Diagnosis-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 He has been undergoing surveillance CT scans since then. CT chest in April 2024 showed increasing areas of disease with left-sided mediastinal and hilar lymph nodes and soft tissue along the left main bronchus. 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 No residual focal hypermetabolism in the treated area of the left upper lobe. No evidence of distant metastatic disease. He underwent MRI brain without contrast in February 2024 which did not show any evidence of metastatic disease. Patient was seen by Dr. Tamea and underwent EBUS guided biopsies left paratracheal lymph node was biopsied and consistent with pulmonary adenocarcinoma.  Patient completed concurrent  chemoradiation with weekly CarboTaxol chemotherapy in July 2024.  Not enough tissue available for NGS testing.  Circulogene assay was sent by pulmonary and peripheral blood NGS testing did not show any evidence of EGFR or ALK mutation    Patient started on maintenance durvalumab  in August 2024    Interval history-patient is presently doing well and has Not had any recent hospitalizations.  He follows up with endocrinology for his diabetes.  Denies any cough or shortness of breath.  ECOG PS- 2 Pain scale- 0   Review of systems- Review of Systems  Constitutional:  Negative for chills, fever, malaise/fatigue and weight loss.  HENT:  Negative for congestion, ear discharge and nosebleeds.   Eyes:  Negative for blurred vision.  Respiratory:  Negative for cough, hemoptysis, sputum production, shortness of breath and wheezing.   Cardiovascular:  Negative for chest pain, palpitations, orthopnea and claudication.  Gastrointestinal:  Negative for abdominal pain, blood in stool, constipation, diarrhea, heartburn, melena, nausea and vomiting.  Genitourinary:  Negative for dysuria, flank pain, frequency, hematuria and urgency.  Musculoskeletal:  Negative for back pain, joint pain and myalgias.  Skin:  Negative for rash.  Neurological:  Negative for dizziness, tingling, focal weakness, seizures, weakness and headaches.  Endo/Heme/Allergies:  Does not bruise/bleed easily.  Psychiatric/Behavioral:  Negative for depression and suicidal ideas. The patient does not have insomnia.       No Known Allergies   Past Medical History:  Diagnosis Date   Adenoma of left adrenal gland    Anemia    Aortic atherosclerosis    Arthritis    Atrial fibrillation and flutter (HCC)    a.) CHA2DS2VASc = 7 (age x 2, HTN, CVA x 2, vascular disease history, T2DM);  b.) rate/rhythm maintained without pharmacological intervention;  chronic antithrombotic therapy using ASA + clopidogrel    Bilateral carotid artery stenosis     a.) s/p PTA 04/07/2022 --> 90% RICA --> 9x7x30 Exact stent; b.) doppler 03/14/20214: 1-39% RICA, 40-59% LICA   BPH with urinary obstruction    stable on flomax  ([name]   Childhood asthma    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   a.) CT head and MRI brain 04/05/2022: chronic cortical/subcortical RIGHT MCA territory infarct within the RIGHT parietal lobe and RIGHT temporoparietal junction   Coronary atherosclerosis of unspecified type of vessel, native or graft    a.) cCTA 05/22/2021: Ca score 1524 (84th percentile for age/sex match control) --> FFRct: dLAD = 0.77, dLCx = 0.67, RPLA = 0.77, dOM1 = 0.66; b.) LHC 05/23/2021: 20% dRCA, 50% OM1 - med mgmt   COVID-19 virus infection 12/07/2022   Diastolic dysfunction    a.) TTE 04/06/2022: EF 60-65%, mod LVH, mild MR, AoV sclerosis, G1DD   DOE (dyspnea on exertion)    Emphysema of lung (HCC)    Erectile dysfunction    a.) on PDE5i (sildenafil )   Esophageal stricture    Fall    Gastric AVM    Gastritis    GERD (gastroesophageal reflux disease)    Hematoma 05/22/2022   Hiatal hernia    Hx of colonic polyps    Hyperlipidemia    Hypertension    Internal hemorrhoids without mention of complication    Ischemic leg    Long term current use of antithrombotics/antiplatelets    a.) on DAPT (ASA + clopidogrel )   Lumbago    Mobitz (type) I (Wenckebach's) atrioventricular block    Non-small cell lung cancer (HCC)    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   a.) PCR (+) 05/29/2018, 04/07/2022   Pain in limb 12/11/2017   Pneumonia    PSA elevation    now averaging 2's   PVD (peripheral vascular disease) with claudication    Sigmoid diverticulosis    Tobacco abuse    Type 2 diabetes mellitus treated with insulin  [facility])      Past Surgical History:  Procedure Laterality Date   2D Echo  07/2001   BACK SURGERY     CAROTID PTA/STENT INTERVENTION Right 04/07/2022   Procedure: CAROTID PTA/STENT INTERVENTION;  Surgeon: Jama Cordella MATSU, MD;  Location: [facility] INVASIVE CV LAB;  Service: Cardiovascular;  Laterality: Right;   COLONOSCOPY     ENDOBRONCHIAL ULTRASOUND Left 06/19/2022   Procedure: ENDOBRONCHIAL ULTRASOUND;  Surgeon: Tamea Dedra CROME, MD;  Location: [facility] ORS;  Service: Pulmonary;  Laterality: Left;   ESOPHAGOGASTRODUODENOSCOPY  02/13/2007   gastritis and duodenitis without bleed   ESOPHAGOGASTRODUODENOSCOPY N/A 06/26/2014   Procedure: ESOPHAGOGASTRODUODENOSCOPY (EGD);  Surgeon: Toribio SHAUNNA Cedar, MD;  Location: [facility] ENDOSCOPY;  Service: Endoscopy;  Laterality: N/A;   ESOPHAGOGASTRODUODENOSCOPY (EGD) WITH PROPOFOL  N/A 04/08/2020   Procedure: ESOPHAGOGASTRODUODENOSCOPY (EGD) WITH PROPOFOL ;  Surgeon: Aneita Gwendlyn DASEN, MD;  Location: [facility] ENDOSCOPY;  Service: Endoscopy;  Laterality: N/A;   FLEXIBLE BRONCHOSCOPY Left 06/19/2022   Procedure: FLEXIBLE BRONCHOSCOPY;  Surgeon: Tamea Dedra CROME, MD;  Location: [facility] ORS;  Service: Pulmonary;  Laterality: Left;   HOT HEMOSTASIS N/A 04/08/2020   Procedure: HOT HEMOSTASIS (ARGON PLASMA COAGULATION/BICAP);  Surgeon: Aneita Gwendlyn DASEN, MD;  Location: [facility];  Service: Endoscopy;  Laterality: N/A;   IR IMAGING GUIDED PORT INSERTION  06/30/2022   KNEE ARTHROSCOPY Right 11/1998   LEFT HEART CATH AND CORONARY ANGIOGRAPHY N/A 05/23/2021   Procedure: LEFT HEART CATH AND CORONARY ANGIOGRAPHY;  Surgeon:  Thukkani, Arun K, MD;  Location: [facility] INVASIVE CV LAB;  Service: Cardiovascular;  Laterality: N/A;   LOWER EXTREMITY ANGIOGRAPHY Right 12/27/2017   Procedure: LOWER EXTREMITY ANGIOGRAPHY;  Surgeon: Marea Selinda RAMAN, MD;  Location: [facility] INVASIVE CV LAB;  Service: Cardiovascular;  Laterality: Right;   LOWER EXTREMITY ANGIOGRAPHY Left 01/10/2018   Procedure: LOWER EXTREMITY ANGIOGRAPHY;  Surgeon: Marea Selinda RAMAN, MD;  Location: [facility] INV[facility];  Service: Cardiovascular;  Laterality: Left;   LOWER EXTREMITY ANGIOGRAPHY Left 05/29/2018   Procedure: LOWER EXTREMITY ANGIOGRAPHY;  Surgeon: Marea Selinda RAMAN, MD;  Location: [facility] CV [facility];  Service: Cardiovascular;  Laterality: Left;   LOWER EXTREMITY ANGIOGRAPHY Left 05/30/2018   Procedure: Lower Extremity Angiography;  Surgeon: Marea Selinda RAMAN, MD;  Location: [facility] INVAS[facility] CV LAB;  Service: Cardiovascular;  Laterality: Left;   LP  07/2001   Microwave thermotherapy prostate  08/28/2007   [place]   Persantine cardiolite  04/1999   EF 68%   POLYPECTOMY  12/1993   Stress myoview   08/2004   EF 67%   UPPER GASTROINTESTINAL ENDOSCOPY      Social History   Socioeconomic History   Marital status: Widowed    Spouse name: Marshall   Number of children: 3   Years of education: Not on file   Highest education level: Not on file  Occupational History   Occupation: Retired Music therapist  Tobacco Use   Smoking status: Former    Current packs/day: 0.00    Average packs/day: 2.0 packs/day for 57.3 years (114.5 ttl pk-yrs)    Types: Cigarettes    Start date: 02/07/1964    Quit date: 05/11/2021    Years since quitting: 2.4   Smokeless tobacco: Former    Types: Chew   Tobacco comments:    Quit in 2023- khj  Vaping Use   Vaping status: Never Used  Substance and Sexual Activity   Alcohol use: Yes    Comment: rare   Drug use: No   Sexual activity: Yes  Other Topics Concern   Not on file  Social History Narrative   Married 9/09 wife with moderate memory problems.   3 adult children, 2 grandchildren   Would desire CPR   Social Drivers of Health   Financial Resource Strain: Low Risk  (01/25/2023)   Overall Financial Resource Strain (CARDIA)    Difficulty of Paying Living Expenses: Not very hard  Food Insecurity: No Food Insecurity (01/25/2023)   Hunger Vital Sign    Worried About Running Out of Food in the Last Year: Never true    Ran Out of Food in the Last Year: Never true  Transportation Needs: No Transportation Needs (01/25/2023)   PRAPARE - Administrator, Civil Service (Medical): No    Lack of Transportation (Non-Medical):  No  Physical Activity: Inactive (01/25/2023)   Exercise Vital Sign    Days of Exercise per Week: 0 days    Minutes of Exercise per Session: 0 min  Stress: No Stress Concern Present (01/25/2023)   [employer] of Occupational Health - Occupational Stress Questionnaire    Feeling of Stress : Only a little  Social Connections: Moderately Isolated (01/25/2023)   Social Connection and Isolation Panel    Frequency of Communication with Friends and Family: Twice a week    Frequency of Social Gatherings with Friends and Family: Once a week    Attends Religious Services: More than 4 times per year    Active Member of Clubs or Organizations: No  Attends Banker Meetings: Never    Marital Status: Widowed  Intimate Partner Violence: Not At Risk (01/25/2023)   Humiliation, Afraid, Rape, and Kick questionnaire    Fear of Current or Ex-Partner: No    Emotionally Abused: No    Physically Abused: No    Sexually Abused: No    Family History  Problem Relation Age of Onset   Rheum arthritis Mother    Diabetes Father    Alcohol abuse Paternal Uncle    Alcohol abuse Paternal Uncle    Alcohol abuse Paternal Uncle    Heart disease Neg Hx    Stroke Neg Hx    Cancer Neg Hx    Drug abuse Neg Hx    Depression Neg Hx    Colon cancer Neg Hx    Rectal cancer Neg Hx    Stomach cancer Neg Hx    Kidney cancer Neg Hx    Bladder Cancer Neg Hx    Prostate cancer Neg Hx      Current Outpatient Medications:    acetaminophen  (TYLENOL ) 500 MG tablet, Take 1,000 mg by mouth every 6 (six) hours. Rapid release, Disp: , Rfl:    albuterol  (VENTOLIN  HFA) 108 (90 Base) MCG/ACT inhaler, Inhale 2 puffs into the lungs every 6 (six) hours as needed for wheezing or shortness of breath., Disp: 8 g, Rfl: 2   atorvastatin  (LIPITOR) 20 MG tablet, TAKE 1 TABLET BY MOUTH IN THE EVENING FOR CHOLESTEROL, Disp: 90 tablet, Rfl: 0   ferrous gluconate  (FERGON) 324 MG tablet, TAKE 1 TABLET BY MOUTH EVERY DAY WITH  BREAKFAST, Disp: 30 tablet, Rfl: 0   FIASP  FLEXTOUCH 100 UNIT/ML FlexTouch Pen, Inject 10 units before lunch meal each day., Disp: , Rfl:    FIBER PO, Take 1 capsule by mouth in the morning., Disp: , Rfl:    finasteride  (PROSCAR ) 5 MG tablet, TAKE 1 TABLET (5 MG TOTAL) BY MOUTH DAILY., Disp: 90 tablet, Rfl: 0   Fluticasone -Umeclidin-Vilant (TRELEGY ELLIPTA ) 100-62.5-25 MCG/ACT AEPB, Inhale 1 puff into the lungs daily., Disp: 28 each, Rfl: 11   GLUCOSAMINE HCL PO, Take 1 tablet by mouth daily., Disp: , Rfl:    insulin  aspart (NOVOLOG ) 100 UNIT/ML injection, Inject 10 Units into the skin once., Disp: , Rfl:    Insulin  Glargine (BASAGLAR KWIKPEN) 100 UNIT/ML, Inject 30 Units into the skin every morning., Disp: , Rfl:    insulin  glargine-yfgn (SEMGLEE) 100 UNIT/ML Pen, SMARTSIG:30 Unit(s) SUB-Q Daily, Disp: , Rfl:    lisinopril  (ZESTRIL ) 30 MG tablet, Take 1 tablet (30 mg total) by mouth daily. for blood pressure., Disp: 90 tablet, Rfl: 0   metFORMIN  (GLUCOPHAGE ) 500 MG tablet, TAKE 2 TABLETS BY MOUTH TWICE A DAY WITH MEAL FOR DIABETES (Patient taking differently: Take 1,000 mg by mouth 2 (two) times daily. TAKE 2 TABLETS BY MOUTH TWICE A DAY WITH MEAL FOR DIABETES), Disp: 360 tablet, Rfl: 2   pantoprazole  (PROTONIX ) 40 MG tablet, TAKE 1 TABLET (40 MG TOTAL) BY MOUTH DAILY FOR HEARTBURN, Disp: 90 tablet, Rfl: 0   tamsulosin  (FLOMAX ) 0.4 MG CAPS capsule, TAKE 1 CAPSULE BY MOUTH 2 TIMES DAILY., Disp: 180 capsule, Rfl: 3   vitamin B-12 (CYANOCOBALAMIN) 1000 MCG tablet, Take 1,000 mcg by mouth in the morning., Disp: , Rfl:    apixaban  (ELIQUIS ) 5 MG TABS tablet, Take 5 mg by mouth. (Patient not taking: Reported on 11/09/2023), Disp: , Rfl:    clopidogrel  (PLAVIX ) 75 MG tablet, TAKE 1 TABLET BY MOUTH EVERY DAY,  Disp: 90 tablet, Rfl: 3   glipiZIDE  (GLUCOTROL ) 10 MG tablet, Take 10 mg by mouth daily before breakfast. (Patient not taking: Reported on 10/22/2023), Disp: , Rfl:    sildenafil  (VIAGRA ) 100 MG tablet,  Take 1 tablet (100 mg total) by mouth daily as needed for erectile dysfunction., Disp: 30 tablet, Rfl: 0 No current facility-administered medications for this visit.  Facility-Administered Medications Ordered in Other Visits:    sodium chloride  flush (NS) 0.9 % injection 10 mL, 10 mL, Intracatheter, PRN, Melanee Annah BROCKS, MD, 10 mL at 01/19/23 1205  Physical exam:  Vitals:   11/09/23 1111  BP: (!) 176/78  Pulse: 70  Resp: 18  Temp: (!) 97.5 F (36.4 C)  TempSrc: Tympanic  SpO2: 100%  Weight: 176 lb 4.8 oz (80 kg)  Height: 5' 6 (1.676 m)   Physical Exam Constitutional:      Comments: Ambulates with a cane.  Appears in no acute distress  Cardiovascular:     Rate and Rhythm: Normal rate and regular rhythm.     Heart sounds: Normal heart sounds.  Pulmonary:     Effort: Pulmonary effort is normal.     Breath sounds: Normal breath sounds.  Abdominal:     General: Bowel sounds are normal.     Palpations: Abdomen is soft.  Skin:    General: Skin is warm and dry.  Neurological:     Mental Status: He is alert and oriented to person, place, and time.      I have personally reviewed labs listed below:    Latest Ref Rng & Units 10/26/2023   11:15 AM  CMP  Creatinine 0.61 - 1.24 mg/dL 8.99       Latest Ref Rng & Units 08/03/2023    8:32 AM  CBC  WBC 4.0 - 10.5 K/uL 7.1   Hemoglobin 13.0 - 17.0 g/dL 88.4   Hematocrit 60.9 - 52.0 % 34.8   Platelets 150 - 400 K/uL 233    I have personally reviewed Radiology images listed below: No images are attached to the encounter.  CT CHEST ABDOMEN PELVIS W CONTRAST Result Date: 10/28/2023 CLINICAL DATA:  Recurrent non-small cell lung cancer on therapy, * Tracking Code: BO * EXAM: CT CHEST, ABDOMEN, AND PELVIS WITH CONTRAST TECHNIQUE: Multidetector CT imaging of the chest, abdomen and pelvis was performed following the standard protocol during bolus administration of intravenous contrast. RADIATION DOSE REDUCTION: This exam was performed  according to the departmental dose-optimization program which includes automated exposure control, adjustment of the mA and/or kV according to patient size and/or use of iterative reconstruction technique. CONTRAST:  85mL OMNIPAQUE  IOHEXOL  300 MG/ML  SOLN COMPARISON:  Multiple priors including CT Jun 22, 2023 FINDINGS: CT CHEST FINDINGS Cardiovascular: Accessed right chest Port-A-Cath with the tip near the superior cavoatrial junction. Aortic atherosclerosis. Coronary artery calcifications. Normal size heart. No significant pericardial effusion/thickening. Mediastinum/Nodes: No suspicious thyroid  nodule. No pathologically enlarged mediastinal, hilar or axillary lymph nodes. AP window lymph nodes which were hypermetabolic on PET-CT May 30, 2022 are stable from prior for reference measuring 4 mm in short axis on image 19/2 previously 5 mm. Similar treated soft tissue about the left hilum for instance image 25/2. Similar marked symmetric distal esophageal wall thickening with patulous appearance of the proximal esophagus. Lungs/Pleura: Slightly increased left perihilar masslike consolidation with bronchiectasis/bronchiolectasis and architectural distortion, this obscures definitive the treated perihilar nodule. No new suspicious nodularity about the treatment bed. Similar conspicuity and solid character of the ground-glass opacity in  the right as ago esophageal recess image 72/3. No new suspicious pulmonary nodules or masses. Biapical pleural-parenchymal scarring. Scattered atelectasis/scarring. No pleural effusion. No pneumothorax. Musculoskeletal: Lucency in the anterior left glenoid with cortical break on image 16/3 has increased in size over prior examinations but was not discretely hypermetabolic on prior PET-CT May 30, 2022 when it measured 7 mm now measuring 12 mm. There are additional small subchondral cystic foci in the left glenoid and this is favored degenerative. Suggest continued attention on follow-up  imaging. Facet spondylosis. CT ABDOMEN PELVIS FINDINGS Hepatobiliary: No suspicious hepatic lesion. Gallbladder is unremarkable. No biliary ductal dilation. Pancreas: No pancreatic ductal dilation or evidence of acute inflammation. Spleen: No splenomegaly. Adrenals/Urinary Tract: Stable 2.6 x 2.0 cm left adrenal nodule which was not hypermetabolic on prior PET-CT compatible with a benign adenoma. Right adrenal gland appears normal. No hydronephrosis.  Kidneys demonstrate symmetric enhancement. Symmetric wall thickening of a nondistended urinary bladder which is effaced by an enlarged prostate gland. Stomach/Bowel: Stomach is unremarkable for degree of distension. No pathologic dilation of small or large bowel. Noninflamed appendix. Colonic diverticulosis. Vascular/Lymphatic: Aortic and branch vessel atherosclerosis. Smooth IVC contours. The portal, splenic and superior mesenteric veins are patent. No pathologically enlarged abdominal or pelvic lymph nodes. Reproductive: Enlarged prostate gland. Other: Fat containing left inguinal hernia. Nodular stranding in the anterior abdominal wall commonly reflects sequela of subcutaneous injections. Musculoskeletal: No suspicious osseous lesion. Demineralization of bone. Thoracic spondylosis. Degenerative change of the bilateral hips. Chronic osseous changes of the pubic symphysis. IMPRESSION: 1. Slightly increased left perihilar masslike consolidation with bronchiectasis/bronchiolectasis and architectural distortion, this obscures definitive the treated perihilar nodule. No new suspicious nodularity about the treatment bed. Findings are favored to reflect evolving postradiation change. 2. No convincing evidence of new or progressive disease in the chest, abdomen or pelvis. 3. Similar conspicuity and solid character of the ground-glass opacity in the right as ago esophageal recess, favored postradiation change. Continued attention on follow-up imaging suggested. 4. Similar  marked symmetric distal esophageal wall thickening with patulous appearance of the proximal esophagus, favored sequela of radiation therapy. 5. Lucency in the anterior left glenoid with cortical break has increased in size over prior examinations but was not discretely hypermetabolic on prior PET-CT May 30, 2022 when it measured 7 mm now measuring 12 mm. There are additional small subchondral cystic foci in the left glenoid and this is favored degenerative. Suggest continued attention on follow-up imaging. 6.  Aortic Atherosclerosis (ICD10-I70.0). Electronically Signed   By: Reyes Holder M.D.   On: 10/28/2023 12:11   VAS US  ABI WITH/WO TBI Result Date: 10/24/2023  LOWER EXTREMITY DOPPLER STUDY Patient Name:  ZIA KANNER  Date of Exam:   10/22/2023 Medical Rec #: [id number]      Accession #:    [id number] Date of Birth: 03/06/1945     Patient Gender: M Patient Age:   67 years Exam Location:  [facility] Procedure:      VAS US  ABI WITH/WO TBI Referring Phys: [facility] --------------------------------------------------------------------------------  Indications: Claudication, rest pain, and peripheral artery disease. High Risk Factors: Hypertension, past history of smoking, prior CVA.  Vascular Interventions: 05/29/2018 PTA of left PTA and popliteal A. PTA of left                         SFA and popliteal A.  03/31/2018 PTA of left CFA and proximal SFA. Stent left                         distal SFA and proximal popliteal. Performing Technologist: Donnice Charnley RVT  Examination Guidelines: A complete evaluation includes at minimum, Doppler waveform signals and systolic blood pressure reading at the level of bilateral brachial, anterior tibial, and posterior tibial arteries, when vessel segments are accessible. Bilateral testing is considered an integral part of a complete examination. Photoelectric Plethysmograph (PPG) waveforms and toe systolic pressure readings are  included as required and additional duplex testing as needed. Limited examinations for reoccurring indications may be performed as noted.  ABI Findings: +---------+------------------+-----+----------+--------+ Right    Rt Pressure (mmHg)IndexWaveform  Comment  +---------+------------------+-----+----------+--------+ Brachial 185                                       +---------+------------------+-----+----------+--------+ PTA      68                0.37 monophasic         +---------+------------------+-----+----------+--------+ DP       89                0.48 monophasic         +---------+------------------+-----+----------+--------+ Great Toe35                0.19                    +---------+------------------+-----+----------+--------+ +---------+------------------+-----+----------+-------+ Left     Lt Pressure (mmHg)IndexWaveform  Comment +---------+------------------+-----+----------+-------+ Brachial 183                                      +---------+------------------+-----+----------+-------+ PTA      152               0.82 monophasic        +---------+------------------+-----+----------+-------+ DP       151               0.82 monophasic        +---------+------------------+-----+----------+-------+ Great Toe80                0.43                   +---------+------------------+-----+----------+-------+ +-------+-----------+-----------+------------+------------+ ABI/TBIToday's ABIToday's TBIPrevious ABIPrevious TBI +-------+-----------+-----------+------------+------------+ Right  0.48       0.19       0.86        0.77         +-------+-----------+-----------+------------+------------+ Left   0.82       0.43       0.77        0.82         +-------+-----------+-----------+------------+------------+  Right ABIs appear decreased compared to prior study on 09/16/20241. Left ABIs appear essentially unchanged compared to prior study  on 10/23/2022.  Summary: Right: Resting right ankle-brachial index indicates severe right lower extremity arterial disease. The right toe-brachial index is abnormal.  Monophasic Doppler waveforms seen in the popliteal and SFA suggesting proximal obstruction / stenosis. Left: Resting left ankle-brachial index indicates mild left lower extremity arterial disease. The left toe-brachial index is abnormal.  *See table(s) above for measurements and observations.  Electronically signed by Cordella Shawl MD on  10/24/2023 at 8:17:21 AM.    Final    VAS US  CAROTID Result Date: 10/24/2023 Carotid Arterial Duplex Study Patient Name:  BARAA TUBBS  Date of Exam:   10/22/2023 Medical Rec #: [id number]      Accession #:    [id number] Date of Birth: 09-Nov-1945     Patient Gender: M Patient Age:   88 years Exam Location:  [facility] Procedure:      VAS US  CAROTID Referring Phys: CORDELLA SHAWL --------------------------------------------------------------------------------  Indications:   Carotid artery disease and Right stent. Risk Factors:  Hypertension, Diabetes, past history of smoking, prior CVA. Other Factors: 04/07/2022: RT ICA Stent. Performing Technologist: Donnice Charnley RVT  Examination Guidelines: A complete evaluation includes B-mode imaging, spectral Doppler, color Doppler, and power Doppler as needed of all accessible portions of each vessel. Bilateral testing is considered an integral part of a complete examination. Limited examinations for reoccurring indications may be performed as noted.  Right Carotid Findings: +----------+--------+--------+--------+------------------+-------------+           PSV cm/sEDV cm/sStenosisPlaque DescriptionComments      +----------+--------+--------+--------+------------------+-------------+ CCA Prox  47      7       <50%    homogeneous                     +----------+--------+--------+--------+------------------+-------------+ CCA Mid   35      9                                                +----------+--------+--------+--------+------------------+-------------+ CCA Distal64      17      <50%    calcific          stent         +----------+--------+--------+--------+------------------+-------------+ ICA Prox  96      27      1-39%                     stent         +----------+--------+--------+--------+------------------+-------------+ ICA Mid   124     19                                              +----------+--------+--------+--------+------------------+-------------+ ICA Distal94      19                                              +----------+--------+--------+--------+------------------+-------------+ ECA       350             >50%                      through stent +----------+--------+--------+--------+------------------+-------------+ +----------+--------+-------+----------------+-------------------+           PSV cm/sEDV cmsDescribe        Arm Pressure (mmHG) +----------+--------+-------+----------------+-------------------+ Dlarojcpjw22      0      Multiphasic, WNL                    +----------+--------+-------+----------------+-------------------+ +---------+--------+--+--------+-+---------+ VertebralPSV cm/s50EDV cm/s7Antegrade +---------+--------+--+--------+-+---------+  Right Stent(s): +----------------+--------+--------+--------+--------+--------+ Distal CCA /  ICAPSV cm/sEDV cm/sStenosisWaveformComments +----------------+--------+--------+--------+--------+--------+ Prox to Stent   35      9                                +----------------+--------+--------+--------+--------+--------+ Proximal Stent  29      8                                +----------------+--------+--------+--------+--------+--------+ Mid Stent       55      13                               +----------------+--------+--------+--------+--------+--------+ Distal Stent    85      15                                +----------------+--------+--------+--------+--------+--------+ Distal to Stent 110     28                               +----------------+--------+--------+--------+--------+--------+   Left Carotid Findings: +----------+--------+--------+--------+--------------------------+--------+           PSV cm/sEDV cm/sStenosisPlaque Description        Comments +----------+--------+--------+--------+--------------------------+--------+ CCA Prox  120     13                                                 +----------+--------+--------+--------+--------------------------+--------+ CCA Mid   90      11                                                 +----------+--------+--------+--------+--------------------------+--------+ CCA Distal53      8       <50%    heterogenous and irregular         +----------+--------+--------+--------+--------------------------+--------+ ICA Prox  229     37      1-39%   irregular and calcific             +----------+--------+--------+--------+--------------------------+--------+ ICA Mid   90      16                                                 +----------+--------+--------+--------+--------------------------+--------+ ICA Distal77      15                                                 +----------+--------+--------+--------+--------------------------+--------+ ECA       261     38      >50%    irregular and calcific             +----------+--------+--------+--------+--------------------------+--------+ +----------+--------+--------+----------------+-------------------+           PSV cm/sEDV cm/sDescribe  Arm Pressure (mmHG) +----------+--------+--------+----------------+-------------------+ Dlarojcpjw05      0       Multiphasic, WNL                    +----------+--------+--------+----------------+-------------------+ +---------+--------+--+--------+--+---------+ VertebralPSV cm/s65EDV  cm/s16Antegrade +---------+--------+--+--------+--+---------+   Summary: Right Carotid: Velocities in the right ICA are consistent with a 1-39% stenosis.                Non-hemodynamically significant plaque <50% noted in the CCA. Left Carotid: Velocities in the left ICA are consistent with a 1-39% stenosis.               Non-hemodynamically significant plaque <50% noted in the CCA. The               ECA appears >50% stenosed. Vertebrals:  Bilateral vertebral arteries demonstrate antegrade flow. Subclavians: Normal flow hemodynamics were seen in bilateral subclavian              arteries. *See table(s) above for measurements and observations.  Electronically signed by Cordella Shawl MD on 10/24/2023 at 8:16:43 AM.    Final      Assessment and plan- Patient is a 78 y.o. male  with history of locally recurrent stage III non-small cell lung cancer s/p concurrent chemoradiation and 1 year of maintenance durvalumab  currently in remission here for a routine follow-up  I have reviewed CT chest abdomen and pelvis images independently and discussed findings with the patient which shows continued evolving changes of radiation involving the left perihilar region.  No evidence of recurrent or progressive disease.  I will repeat his CT scans in 6 months and see him thereafter.   Visit Diagnosis 1. Encounter for follow-up surveillance of lung cancer      Dr. Annah Skene, MD, MPH [facility] [phone] 11/09/2023 12:01 PM

## 2023-11-12 ENCOUNTER — Encounter: Payer: Self-pay | Admitting: Oncology

## 2023-11-19 ENCOUNTER — Ambulatory Visit (INDEPENDENT_AMBULATORY_CARE_PROVIDER_SITE_OTHER): Admitting: Podiatry

## 2023-11-19 ENCOUNTER — Encounter: Payer: Self-pay | Admitting: Podiatry

## 2023-11-19 DIAGNOSIS — B351 Tinea unguium: Secondary | ICD-10-CM

## 2023-11-19 DIAGNOSIS — M79675 Pain in left toe(s): Secondary | ICD-10-CM | POA: Diagnosis not present

## 2023-11-19 DIAGNOSIS — M79674 Pain in right toe(s): Secondary | ICD-10-CM | POA: Diagnosis not present

## 2023-11-19 DIAGNOSIS — E1151 Type 2 diabetes mellitus with diabetic peripheral angiopathy without gangrene: Secondary | ICD-10-CM

## 2023-11-19 NOTE — Progress Notes (Signed)
 Subjective:  Patient ID: Antonio [name], male    DOB: 1945/09/06,  MRN: [id number]  MOHAMEDAMIN NIFONG presents to clinic today for at risk foot care. Pt has h/o NIDDM with PAD and painful elongated mycotic toenails 1-5 bilaterally which are tender when wearing enclosed shoe gear. Pain is relieved with periodic professional debridement. He is followed closely by Vascular Surgery. Last Vascular appointment was 10/22/2023. Chief Complaint  Patient presents with   Toe Pain    Diabetic foot care. Last visit with Dr. Gretta was in Nov. 2024. Last A1c was 8.8   New problem(s): None.   Past Surgical History:  Procedure Laterality Date   2D Echo  07/2001   BACK SURGERY     CAROTID PTA/STENT INTERVENTION Right 04/07/2022   Procedure: CAROTID PTA/STENT INTERVENTION;  Surgeon: Jama Cordella MATSU, MD;  Location: [facility];  Service: Cardiovascular;  Laterality: Right;   COLONOSCOPY     ENDOBRONCHIAL ULTRASOUND Left 06/19/2022   Procedure: ENDOBRONCHIAL ULTRASOUND;  Surgeon: Tamea Dedra CROME, MD;  Location: [facility] OR[facility];  Service: Pulmonary;  Laterality: Left;   ESOPHAGOGASTRODUODENOSCOPY  02/13/2007   gastritis and duodenitis without bleed   ESOPHAGOGASTRODUODENOSCOPY N/A 06/26/2014   Procedure: ESOPHAGOGASTRODUODENOSCOPY (EGD);  Surgeon: Toribio SHAUNNA Cedar, MD;  Location: [facility] ENDOSCOPY;  Service: Endoscopy;  Laterality: N/A;   ESOPHAGOGASTRODUODENOSCOPY (EGD) WITH PROPOFOL  N/A 04/08/2020   Procedure: ESOPHAGOGASTRODUODENOSCOPY (EGD) WITH PROPOFOL ;  Surgeon: Aneita Gwendlyn DASEN, MD;  Location: [facility] ENDOSCOPY;  Service: Endoscopy;  Laterality: N/A;   FLEXIBLE BRONCHOSCOPY Left 06/19/2022   Procedure: FLEXIBLE BRONCHOSCOPY;  Surgeon: Tamea Dedra CROME, MD;  Location: [facility] OR[facility];  Service: Pulmonary;  Laterality: Left;   HOT HEMOSTASIS N/A 04/08/2020   Procedure: HOT HEMOSTASIS (ARGON PLASMA COAGULATION/BICAP);  Surgeon: Aneita Gwendlyn DASEN, MD;  Location: [facility];  Service: Endoscopy;  Laterality: N/A;    IR IMAGING GUIDED PORT INSERTION  06/30/2022   KNEE ARTHROSCOPY Right 11/1998   LEFT HEART CATH AND CORONARY ANGIOGRAPHY N/A 05/23/2021   Procedure: LEFT HEART CATH AND CORONARY ANGIOGRAPHY;  Surgeon: Wendel Lurena POUR, MD;  Location: [facility];  Service: Cardiovascular;  Laterality: N/A;   LOWER EXTREMITY ANGIOGRAPHY Right 12/27/2017   Procedure: LOWER EXTREMITY ANGIOGRAPHY;  Surgeon: Marea Selinda RAMAN, MD;  Location: [facility];  Service: Cardiovascular;  Laterality: Right;   LOWER EXTREMITY ANGIOGRAPHY Left 01/10/2018   Procedure: LOWER EXTREMITY ANGIOGRAPHY;  Surgeon: Marea Selinda RAMAN, MD;  Location: [facility];  Service: Cardiovascular;  Laterality: Left;   LOWER EXTREMITY ANGIOGRAPHY Left 05/29/2018   Procedure: LOWER EXTREMITY ANGIOGRAPHY;  Surgeon: Marea Selinda RAMAN, MD;  Location: [facility];  Service: Cardiovascular;  Laterality: Left;   LOWER EXTREMITY ANGIOGRAPHY Left 05/30/2018   Procedure: Lower Extremity Angiography;  Surgeon: Marea Selinda RAMAN, MD;  Location: [facility];  Service: Cardiovascular;  Laterality: Left;   LP  07/2001   Microwave thermotherapy prostate  08/28/2007   [place]   Persantine cardiolite  04/1999   EF 68%   POLYPECTOMY  12/1993   Stress myoview   08/2004   EF 67%   UPPER GASTROINTESTINAL ENDOSCOPY       PCP is Gretta Comer POUR, NP.  No Known Allergies  Review of Systems: Negative except as noted in the HPI.  Objective:  There were no vitals filed for this visit. Antonio [name] is a pleasant 78 y.o. male WD, WN in NAD. AAO x 3.  Vascular Examination: CFT <4 seconds b/l. DP pulses diminished b/l.  PT pulses diminished b/l. Digital hair absent. Skin temperature gradient warm to cool b/l. No ischemia or gangrene. No cyanosis or clubbing noted b/l. No edema noted b/l LE.   Neurological Examination: Pt has subjective symptoms of neuropathy. Protective sensation diminished with 10g monofilament b/l.  Dermatological  Examination: Pedal skin thin, shiny and atrophic b/l. No open wounds. No interdigital macerations.   Toenails 1-5 b/l thick, discolored, elongated with subungual debris and pain on dorsal palpation.   No corns, calluses, nor porokeratotic lesions.  Musculoskeletal Examination: Muscle strength 5/5 to all lower extremity muscle groups bilaterally. No pain, crepitus or joint limitation noted with ROM b/l LE. Pes planus deformity noted left foot.. Patient ambulates with cane assistance.  Radiographs: None  Assessment/Plan: 1. Pain due to onychomycosis of toenails of both feet   2. Type II diabetes mellitus with peripheral circulatory disorder [facility])   Patient was evaluated and treated. All patient's and/or POA's questions/concerns addressed on today's visit. Mycotic toenails 1-5 debrided in length and girth without incident.  Continue daily foot inspections and monitor blood glucose per PCP/Endocrinologist's recommendations.Continue soft, supportive shoe gear daily. Report any pedal injuries to medical professional. Call office if there are any quesitons/concerns. -Patient/POA to call should there be question/concern in the interim.   Return in about 10 weeks (around 01/28/2024).  Delon LITTIE Merlin, DPM      [facility] LOCATION: [address] [place] [address]                   Office [phone]   [facility] LOCATION: [address] [place], [place] [address] Office [phone]

## 2023-12-06 ENCOUNTER — Ambulatory Visit (INDEPENDENT_AMBULATORY_CARE_PROVIDER_SITE_OTHER): Admitting: Pulmonary Disease

## 2023-12-06 ENCOUNTER — Encounter: Payer: Self-pay | Admitting: Pulmonary Disease

## 2023-12-06 VITALS — BP 130/78 | HR 51 | Temp 98.1°F | Ht 66.0 in | Wt 179.8 lb

## 2023-12-06 DIAGNOSIS — J449 Chronic obstructive pulmonary disease, unspecified: Secondary | ICD-10-CM | POA: Diagnosis not present

## 2023-12-06 DIAGNOSIS — Z87891 Personal history of nicotine dependence: Secondary | ICD-10-CM | POA: Diagnosis not present

## 2023-12-06 DIAGNOSIS — C3492 Malignant neoplasm of unspecified part of left bronchus or lung: Secondary | ICD-10-CM | POA: Diagnosis not present

## 2023-12-06 MED ORDER — BREO ELLIPTA 100-25 MCG/ACT IN AEPB: 1.0000 | INHALATION_SPRAY | Freq: Every day | RESPIRATORY_TRACT | 5 refills | Status: AC

## 2023-12-06 NOTE — Patient Instructions (Addendum)
 VISIT SUMMARY:  You came in today for a follow-up appointment. 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 We also talked about issues with medication coverage and the cost of your inhaler.  YOUR PLAN:  -NONSMALL CELL LUNG CANCER: nonsmall cell lung cancer is a type of lung cancer that has been treated with radiation and chemotherapy. We are keeping it under surveillance and have scheduled a follow-up scan for March next year.  -CHRONIC OBSTRUCTIVE PULMONARY DISEASE (COPD): COPD is a chronic inflammatory lung disease that obstructs airflow from the lungs. You were previously using an inhaler, but cost issues have arisen. We have prescribed a cheaper alternative (according to insurance) called Breo which is 1 puff daily.. Please rinse your mouth well after each use. We will also work with the pharmacy team to find a covered inhaler option for you. Contact our office if you have any problems with the inhaler.  INSTRUCTIONS:  You have a follow-up scan scheduled for March next year to monitor your small cell lung cancer. Please use the prescribed inhaler twice a day and rinse your mouth well after each use. Contact our office if you encounter any issues with the inhaler. We will also coordinate with the pharmacy team to find a covered inhaler option for you.

## 2023-12-06 NOTE — Progress Notes (Unsigned)
 Subjective:    Patient ID: Antonio [name], male    DOB: 12/16/1945, 78 y.o.   MRN: [id number]  Patient Care Team: Gretta Comer POUR, NP as PCP - General (Internal Medicine) Darliss Rogue, MD as PCP - Cardiology (Cardiology) Fernande Elspeth BROCKS, MD (Inactive) as PCP - Electrophysiology (Cardiology) Helon Clotilda DELENA DEVONNA as Physician Assistant (Urology) Aneita Gwendlyn DASEN, MD (Inactive) as Consulting Physician (Gastroenterology) Fate Morna SAILOR, [facility] (Inactive) as Pharmacist (Pharmacist) Verdene Gills, RN as Oncology Nurse Navigator Melanee Annah BROCKS, MD as Consulting Physician (Oncology) Jaye Fallow, MD as Referring Physician (Ophthalmology) Melanee Annah BROCKS, MD as Consulting Physician (Oncology)  Chief Complaint  Patient presents with   COPD    Shortness of breath on exertion.     BACKGROUND:   HPI    Review of Systems A 10 point review of systems was performed and it is as noted above otherwise negative.   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   a.) CT head and MRI brain 04/05/2022: chronic cortical/subcortical RIGHT MCA territory infarct within the RIGHT parietal lobe and RIGHT temporoparietal junction   Coronary atherosclerosis of unspecified type of vessel, native or graft    a.) cCTA 05/22/2021: Ca score 1524 (84th percentile for age/sex match control) --> FFRct:  dLAD = 0.77, dLCx = 0.67, RPLA = 0.77, dOM1 = 0.66; b.) LHC 05/23/2021: 20% dRCA, 50% OM1 - med mgmt   COVID-19 virus infection 12/07/2022   Diastolic dysfunction    a.) TTE 04/06/2022: EF 60-65%, mod LVH, mild MR, AoV sclerosis, G1DD   DOE (dyspnea on exertion)    Emphysema of lung (HCC)    Erectile dysfunction    a.) on PDE5i (sildenafil )   Esophageal stricture    Fall    Gastric AVM    Gastritis    GERD (gastroesophageal reflux disease)    Hematoma 05/22/2022   Hiatal hernia    Hx of colonic polyps    Hyperlipidemia    Hypertension    Internal hemorrhoids without mention of complication    Ischemic leg    Long term current use of antithrombotics/antiplatelets    a.) on DAPT (ASA + clopidogrel )   Lumbago    Mobitz (type) I (Wenckebach's) atrioventricular block    Non-small cell lung cancer (HCC)    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   a.) PCR (+) 05/29/2018, 04/07/2022   Pain in limb 12/11/2017   Pneumonia    PSA elevation    now averaging 2's   PVD (peripheral vascular disease) with claudication    Sigmoid diverticulosis    Tobacco abuse    Type 2 diabetes mellitus treated with insulin  [facility])     Past Surgical History:  Procedure Laterality Date   2D Echo  07/2001   BACK SURGERY     CAROTID PTA/STENT INTERVENTION Right 04/07/2022  Procedure: CAROTID PTA/STENT INTERVENTION;  Surgeon: Jama Cordella MATSU, MD;  Location: [facility] INVASIVE CV LAB;  Service: Cardiovascular;  Laterality: Right;   COLONOSCOPY     ENDOBRONCHIAL ULTRASOUND Left 06/19/2022   Procedure: ENDOBRONCHIAL ULTRASOUND;  Surgeon: Tamea Dedra CROME, MD;  Location: [facility] ORS;  Service: Pulmonary;  Laterality: Left;   ESOPHAGOGASTRODUODENOSCOPY  02/13/2007   gastritis and duodenitis without bleed   ESOPHAGOGASTRODUODENOSCOPY N/A 06/26/2014   Procedure: ESOPHAGOGASTRODUODENOSCOPY (EGD);  Surgeon: Toribio SHAUNNA Cedar, MD;  Location: [facility];  Service: Endoscopy;  Laterality: N/A;   ESOPHAGOGASTRODUODENOSCOPY  (EGD) WITH PROPOFOL  N/A 04/08/2020   Procedure: ESOPHAGOGASTRODUODENOSCOPY (EGD) WITH PROPOFOL ;  Surgeon: Aneita Gwendlyn DASEN, MD;  Location: [facility] ENDOSCOPY;  Service: Endoscopy;  Laterality: N/A;   FLEXIBLE BRONCHOSCOPY Left 06/19/2022   Procedure: FLEXIBLE BRONCHOSCOPY;  Surgeon: Tamea Dedra CROME, MD;  Location: [facility] ORS;  Service: Pulmonary;  Laterality: Left;   HOT HEMOSTASIS N/A 04/08/2020   Procedure: HOT HEMOSTASIS (ARGON PLASMA COAGULATION/BICAP);  Surgeon: Aneita Gwendlyn DASEN, MD;  Location: [facility];  Service: Endoscopy;  Laterality: N/A;   IR IMAGING GUIDED PORT INSERTION  06/30/2022   KNEE ARTHROSCOPY Right 11/1998   LEFT HEART CATH AND CORONARY ANGIOGRAPHY N/A 05/23/2021   Procedure: LEFT HEART CATH AND CORONARY ANGIOGRAPHY;  Surgeon: Wendel Lurena POUR, MD;  Location: [facility] INV[facility] CV LAB;  Service: Cardiovascular;  Laterality: N/A;   LOWER EXTREMITY ANGIOGRAPHY Right 12/27/2017   Procedure: LOWER EXTREMITY ANGIOGRAPHY;  Surgeon: Marea Selinda RAMAN, MD;  Location: [facility] CV [facility];  Service: Cardiovascular;  Laterality: Right;   LOWER EXTREMITY ANGIOGRAPHY Left 01/10/2018   Procedure: LOWER EXTREMITY ANGIOGRAPHY;  Surgeon: Marea Selinda RAMAN, MD;  Location: [facility] CV [facility];  Service: Cardiovascular;  Laterality: Left;   LOWER EXTREMITY ANGIOGRAPHY Left 05/29/2018   Procedure: LOWER EXTREMITY ANGIOGRAPHY;  Surgeon: Marea Selinda RAMAN, MD;  Location: [facility] INV[facility] CV [facility];  Service: Cardiovascular;  Laterality: Left;   LOWER EXTREMITY ANGIOGRAPHY Left 05/30/2018   Procedure: Lower Extremity Angiography;  Surgeon: Marea Selinda RAMAN, MD;  Location: [facility] INV[facility] CV [facility];  Service: Cardiovascular;  Laterality: Left;   LP  07/2001   Microwave thermotherapy prostate  08/28/2007   [place]   Persantine cardiolite  04/1999   EF 68%   POLYPECTOMY  12/1993   Stress myoview   08/2004   EF 67%   UPPER GASTROINTESTINAL ENDOSCOPY      Patient Active Problem List   Diagnosis Date Noted   Orthostatic hypotension  05/01/2023   Bradycardia 05/01/2023   Second degree AV block, Mobitz type I 05/01/2023   COPD (chronic obstructive pulmonary disease) (HCC) 12/07/2022   Recurrent non-small cell lung cancer (HCC) 06/30/2022   Mediastinal adenopathy 06/19/2022   Bilateral carotid artery stenosis 04/06/2022   Encephalopathy 04/05/2022   History of CVA (cerebrovascular accident) 04/05/2022   Fatigue 12/21/2021   Hearing loss of right ear due to cerumen impaction 10/05/2021   Non-small cell lung cancer (HCC) 06/19/2021   Aortic atherosclerosis 06/19/2021   Atrioventricular block, Mobitz type 1, Wenckebach 05/25/2021   Complete heart block (HCC) 05/20/2021   Gastric AVM    Iron  deficiency anemia    Rash and nonspecific skin eruption 12/19/2019   Chronic pain of right hip 10/17/2019   Ischemic leg 05/29/2018   B12 deficiency 04/03/2018   PAD (peripheral artery disease) 12/12/2017   GERD (gastroesophageal reflux disease) 04/30/2017   BPH with obstruction/lower urinary tract symptoms 10/14/2014   Essential hypertension 06/26/2014   Type 2 diabetes mellitus with hyperglycemia (HCC) 06/26/2014  HLD (hyperlipidemia) 06/27/2006   Coronary atherosclerosis 06/27/2006   Tobacco abuse 06/27/2006    Family History  Problem Relation Age of Onset   Rheum arthritis Mother    Diabetes Father    Alcohol abuse Paternal Uncle    Alcohol abuse Paternal Uncle    Alcohol abuse Paternal Uncle    Heart disease Neg Hx    Stroke Neg Hx    Cancer Neg Hx    Drug abuse Neg Hx    Depression Neg Hx    Colon cancer Neg Hx    Rectal cancer Neg Hx    Stomach cancer Neg Hx    Kidney cancer Neg Hx    Bladder Cancer Neg Hx    Prostate cancer Neg Hx     Social History   Tobacco Use   Smoking status: Former    Current packs/day: 0.00    Average packs/day: 2.0 packs/day for 57.3 years (114.5 ttl pk-yrs)    Types: Cigarettes    Start date: 02/07/1964    Quit date: 05/11/2021    Years since quitting: 2.5   Smokeless  tobacco: Former    Types: Chew   Tobacco comments:    Quit in 2023- khj  Substance Use Topics   Alcohol use: Yes    Comment: rare    No Known Allergies  Current Meds  Medication Sig   acetaminophen  (TYLENOL ) 500 MG tablet Take 1,000 mg by mouth every 6 (six) hours. Rapid release   albuterol  (VENTOLIN  HFA) 108 (90 Base) MCG/ACT inhaler Inhale 2 puffs into the lungs every 6 (six) hours as needed for wheezing or shortness of breath.   atorvastatin  (LIPITOR) 20 MG tablet TAKE 1 TABLET BY MOUTH IN THE EVENING FOR CHOLESTEROL   BREO ELLIPTA  100-25 MCG/ACT AEPB Inhale 1 puff into the lungs daily.   clopidogrel  (PLAVIX ) 75 MG tablet TAKE 1 TABLET BY MOUTH EVERY DAY   FIASP  FLEXTOUCH 100 UNIT/ML FlexTouch Pen Inject 10 units before lunch meal each day.   FIBER PO Take 1 capsule by mouth in the morning.   finasteride  (PROSCAR ) 5 MG tablet TAKE 1 TABLET (5 MG TOTAL) BY MOUTH DAILY.   GLUCOSAMINE HCL PO Take 1 tablet by mouth daily.   insulin  aspart (NOVOLOG ) 100 UNIT/ML injection Inject 10 Units into the skin once.   Insulin  Glargine (BASAGLAR KWIKPEN) 100 UNIT/ML Inject 30 Units into the skin every morning.   lisinopril  (ZESTRIL ) 30 MG tablet Take 1 tablet (30 mg total) by mouth daily. for blood pressure.   metFORMIN  (GLUCOPHAGE ) 500 MG tablet TAKE 2 TABLETS BY MOUTH TWICE A DAY WITH MEAL FOR DIABETES   pantoprazole  (PROTONIX ) 40 MG tablet TAKE 1 TABLET (40 MG TOTAL) BY MOUTH DAILY FOR HEARTBURN   sildenafil  (VIAGRA ) 100 MG tablet Take 1 tablet (100 mg total) by mouth daily as needed for erectile dysfunction.   tamsulosin  (FLOMAX ) 0.4 MG CAPS capsule TAKE 1 CAPSULE BY MOUTH 2 TIMES DAILY.   vitamin B-12 (CYANOCOBALAMIN) 1000 MCG tablet Take 1,000 mcg by mouth in the morning.    Immunization History  Administered Date(s) Administered   Moderna Sars-Covid-2 Vaccination 07/09/2019, 08/06/2019   Pneumococcal Conjugate-13 09/30/2018   Td 10/08/1994, 11/19/2006        Objective:     BP  130/78   Pulse (!) 51   Temp 98.1 F (36.7 C) (Temporal)   Ht 5' 6 (1.676 m)   Wt 179 lb 12.8 oz (81.6 kg)   SpO2 96%   BMI 29.02 kg/m  SpO2: 96 %  GENERAL: HEAD: Normocephalic, atraumatic.  EYES: Pupils equal, round, reactive to light.  No scleral icterus.  MOUTH:  NECK: Supple. No thyromegaly. Trachea midline. No JVD.  No adenopathy. PULMONARY: Good air entry bilaterally.  No adventitious sounds. CARDIOVASCULAR: S1 and S2. Regular rate and rhythm.  ABDOMEN: MUSCULOSKELETAL: No joint deformity, no clubbing, no edema.  NEUROLOGIC:  SKIN: Intact,warm,dry. PSYCH:        Assessment & Plan:     ICD-10-CM   1. Chronic obstructive pulmonary disease, unspecified COPD type (HCC)  J44.9     2. Adenocarcinoma of left lung (HCC)  C34.92     3. Former smoker  Z87.891       No orders of the defined types were placed in this encounter.   Meds ordered this encounter  Medications   BREO ELLIPTA  100-25 MCG/ACT AEPB    Sig: Inhale 1 puff into the lungs daily.    Dispense:  60 each    Refill:  5     Advised if symptoms do not improve or worsen, to please contact office for sooner follow up or seek emergency care.    I spent xxx minutes of dedicated to the care of this patient on the date of this encounter to include pre-visit review of records, face-to-face time with the patient discussing conditions above, post visit ordering of testing, clinical documentation with the electronic health record, making appropriate referrals as documented, and communicating necessary findings to members of the patients care team.   C. Leita Sanders, MD Advanced Bronchoscopy PCCM [facility]    *This note was dictated using voice recognition software/Dragon.  Despite best efforts to proofread, errors can occur which can change the meaning. Any transcriptional errors that result from this process are unintentional and may not be fully corrected at the time of  dictation.

## 2023-12-07 ENCOUNTER — Inpatient Hospital Stay

## 2023-12-07 DIAGNOSIS — Z452 Encounter for adjustment and management of vascular access device: Secondary | ICD-10-CM | POA: Diagnosis not present

## 2023-12-07 DIAGNOSIS — C3412 Malignant neoplasm of upper lobe, left bronchus or lung: Secondary | ICD-10-CM | POA: Diagnosis not present

## 2023-12-07 DIAGNOSIS — Z923 Personal history of irradiation: Secondary | ICD-10-CM | POA: Diagnosis not present

## 2023-12-07 DIAGNOSIS — Z79899 Other long term (current) drug therapy: Secondary | ICD-10-CM | POA: Diagnosis not present

## 2023-12-07 DIAGNOSIS — Z87891 Personal history of nicotine dependence: Secondary | ICD-10-CM | POA: Diagnosis not present

## 2023-12-07 DIAGNOSIS — E119 Type 2 diabetes mellitus without complications: Secondary | ICD-10-CM | POA: Diagnosis not present

## 2023-12-19 ENCOUNTER — Ambulatory Visit (INDEPENDENT_AMBULATORY_CARE_PROVIDER_SITE_OTHER): Admitting: Primary Care

## 2023-12-19 ENCOUNTER — Encounter: Payer: Self-pay | Admitting: Primary Care

## 2023-12-19 ENCOUNTER — Ambulatory Visit: Payer: Self-pay | Admitting: Primary Care

## 2023-12-19 VITALS — BP 178/78 | HR 67 | Temp 97.9°F | Ht 66.25 in | Wt 178.2 lb

## 2023-12-19 DIAGNOSIS — C3490 Malignant neoplasm of unspecified part of unspecified bronchus or lung: Secondary | ICD-10-CM | POA: Diagnosis not present

## 2023-12-19 DIAGNOSIS — N138 Other obstructive and reflux uropathy: Secondary | ICD-10-CM

## 2023-12-19 DIAGNOSIS — I739 Peripheral vascular disease, unspecified: Secondary | ICD-10-CM | POA: Diagnosis not present

## 2023-12-19 DIAGNOSIS — I10 Essential (primary) hypertension: Secondary | ICD-10-CM | POA: Diagnosis not present

## 2023-12-19 DIAGNOSIS — I6523 Occlusion and stenosis of bilateral carotid arteries: Secondary | ICD-10-CM

## 2023-12-19 DIAGNOSIS — N401 Enlarged prostate with lower urinary tract symptoms: Secondary | ICD-10-CM

## 2023-12-19 DIAGNOSIS — Z794 Long term (current) use of insulin: Secondary | ICD-10-CM | POA: Diagnosis not present

## 2023-12-19 DIAGNOSIS — K2100 Gastro-esophageal reflux disease with esophagitis, without bleeding: Secondary | ICD-10-CM | POA: Diagnosis not present

## 2023-12-19 DIAGNOSIS — J449 Chronic obstructive pulmonary disease, unspecified: Secondary | ICD-10-CM | POA: Diagnosis not present

## 2023-12-19 DIAGNOSIS — E1165 Type 2 diabetes mellitus with hyperglycemia: Secondary | ICD-10-CM | POA: Diagnosis not present

## 2023-12-19 DIAGNOSIS — E782 Mixed hyperlipidemia: Secondary | ICD-10-CM

## 2023-12-19 LAB — LIPID PANEL
Cholesterol: 106 mg/dL (ref 0–200)
HDL: 39.5 mg/dL (ref >39.00)
LDL Cholesterol: 38 mg/dL (ref 0–99)
NonHDL: 66.28
Total CHOL/HDL Ratio: 3
Triglycerides: 139 mg/dL (ref 0.0–149.0)
VLDL: 27.8 mg/dL (ref 0.0–40.0)

## 2023-12-19 NOTE — Assessment & Plan Note (Signed)
 Above goal today which is historical for him.  Reviewed cardiology notes from August 2025.  Continue lisinopril  30 mg daily.  He will monitor blood pressure at home

## 2023-12-19 NOTE — Patient Instructions (Signed)
 Stop by the lab prior to leaving today. I will notify you of your results once received.   It was a pleasure to see you today!

## 2023-12-19 NOTE — Assessment & Plan Note (Signed)
 Stable.  No recurrence.  Reviewed pulmonology office notes from October 2025 and oncology office notes from October 2025  Continue Breo elliptica inhaler 100-25 micrograms, 1 puff daily. Reviewed CT chest abdomen pelvis from September 2025.

## 2023-12-19 NOTE — Assessment & Plan Note (Signed)
 Repeat lipid panel pending.  We discussed that he can hold atorvastatin  x 1 to 2 weeks to see if his cramping abates. Continue atorvastatin  20 mg daily for now.

## 2023-12-19 NOTE — Progress Notes (Signed)
 Subjective:    Patient ID: Antonio [name], male    DOB: 1945-03-30, 78 y.o.   MRN: [id number]  Antonio [name] is a very pleasant 78 y.o. male with a history of CAD, PAD, complete heart block with pacer, COPD, lung cancer, GERD, type 2 diabetes, hyperlipidemia, CVA who presents today for follow-up of chronic conditions.  1) CAD/PAD/Hyperlipidemia/CAD/Hypertension: Following with cardiology, last office visit was 09/19/23, no changes made to medication regimen.  He is managed on atorvastatin  20 mg daily, clopidogrel  75 mg daily, lisinopril  30 mg daily.  Also following with vascular services, last office visit was in September 2025.  No changes were made to his regimen.  His duplex ultrasound revealed 1 to 39% stenosis bilaterally.  Today he denies chest pain, dizziness. He continues to experience bilateral lower extremity pain which is unchanged. He does have a lot of cramping to his lower extremities.   BP Readings from Last 3 Encounters:  12/19/23 (!) 178/78  12/06/23 130/78  11/09/23 (!) 176/78     2) Type 2 Diabetes: Following with endocrinology, last office visit was 11/08/23. During this visit no changes were made.  He is managed on Basaglar 30 units daily, NovoLog  8 units as needed with meals.  His last A1c was 8.6.  Today he denies recurrent hypoglycemic episodes and is aware how to combat low levels. He has no concerns and is working to lower his A1C.  3) COPD/Lung Cancer: Following with pulmonology, last office visit was 12/06/23. During this visit he was initiated on Breo elliptica 100 mcg for better insurance coverage.  He is also managed on albuterol  inhaler as needed.  Also following with oncology and underwent CT chest abdomen pelvis in September 2025 which was without recurrent or progressive disease.  Last office visit was 11/09/2023.  Today he denies increased shortness of breath. He has picked up the Breo inhaler which is more affordable. He denies fevers, increased cough.  He is feeling well overall.   4) BPH: Following with urology and is managed on tamsulosin  0.4 mg twice daily.   Today he denies difficulty urinating, increased nocturia. He is tolerating his regimen well, feels pleased with his progress.   Review of Systems  Respiratory:  Negative for shortness of breath.   Cardiovascular:  Negative for chest pain.  Gastrointestinal:  Negative for constipation and diarrhea.  Musculoskeletal:  Positive for arthralgias and myalgias.  Neurological:  Negative for dizziness.         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   a.) CT head and MRI brain 04/05/2022: chronic cortical/subcortical RIGHT MCA territory infarct within the RIGHT parietal lobe and RIGHT temporoparietal junction   Coronary atherosclerosis of unspecified type of vessel, native or graft    a.) cCTA 05/22/2021: Ca score 1524 (84th percentile for age/sex match control) --> FFRct: dLAD =  0.77, dLCx = 0.67, RPLA = 0.77, dOM1 = 0.66; b.) LHC 05/23/2021: 20% dRCA, 50% OM1 - med mgmt   COVID-19 virus infection 12/07/2022   Diastolic dysfunction    a.) TTE 04/06/2022: EF 60-65%, mod LVH, mild MR, AoV sclerosis, G1DD   DOE (dyspnea on exertion)    Emphysema of lung (HCC)    Erectile dysfunction    a.) on PDE5i (sildenafil )   Esophageal stricture    Fall    Gastric AVM    Gastritis    GERD  (gastroesophageal reflux disease)    Hematoma 05/22/2022   Hiatal hernia    Hx of colonic polyps    Hyperlipidemia    Hypertension    Internal hemorrhoids without mention of complication    Ischemic leg    Long term current use of antithrombotics/antiplatelets    a.) on DAPT (ASA + clopidogrel )   Lumbago    Mobitz (type) I (Wenckebach's) atrioventricular block    Non-small cell lung cancer (HCC)    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   a.) PCR (+) 05/29/2018, 04/07/2022   Pain in limb 12/11/2017   Pneumonia    PSA elevation    now averaging 2's   PVD (peripheral vascular disease) with claudication    Sigmoid diverticulosis    Tobacco abuse    Type 2 diabetes mellitus treated with insulin  (HCC)     Social History   Socioeconomic History   Marital status: Widowed    Spouse name: Marshall   Number of children: 3   Years of education: Not on file   Highest education level: Not on file  Occupational History   Occupation: Retired music therapist  Tobacco Use   Smoking status: Former    Current packs/day: 0.00    Average packs/day: 2.0 packs/day for 57.3 years (114.5 ttl pk-yrs)    Types: Cigarettes    Start date: 02/07/1964    Quit date: 05/11/2021    Years since quitting: 2.6   Smokeless tobacco: Former    Types: Chew   Tobacco comments:    Quit in 2023- khj  Vaping Use   Vaping status: Never Used  Substance and Sexual Activity   Alcohol use: Yes    Comment: rare   Drug use: No   Sexual activity: Yes  Other Topics Concern   Not on file  Social History Narrative   Married 9/09 wife with moderate memory problems.   3 adult children, 2 grandchildren   Would desire CPR   Social Drivers of Health   Financial Resource Strain: Low Risk  (01/25/2023)   Overall Financial Resource Strain (CARDIA)    Difficulty of Paying Living Expenses: Not very hard  Food Insecurity: No Food Insecurity (01/25/2023)   Hunger Vital Sign    Worried About Running Out of Food in the Last Year:  Never true    Ran Out of Food in the Last Year: Never true  Transportation Needs: No Transportation Needs (01/25/2023)   PRAPARE - Administrator, Civil Service (Medical): No    Lack of Transportation (Non-Medical): No  Physical Activity: Inactive (01/25/2023)   Exercise Vital Sign    Days of Exercise per Week: 0 days    Minutes of Exercise per Session: 0 min  Stress: No Stress Concern Present (01/25/2023)   [employer] of Occupational Health - Occupational Stress Questionnaire    Feeling of Stress : Only a little  Social Connections: Moderately Isolated (01/25/2023)   Social Connection and Isolation Panel  Frequency of Communication with Friends and Family: Twice a week    Frequency of Social Gatherings with Friends and Family: Once a week    Attends Religious Services: More than 4 times per year    Active Member of [employer] or Organizations: No    Attends Banker Meetings: Never    Marital Status: Widowed  Intimate Partner Violence: Not At Risk (01/25/2023)   Humiliation, Afraid, Rape, and Kick questionnaire    Fear of Current or Ex-Partner: No    Emotionally Abused: No    Physically Abused: No    Sexually Abused: No    Past Surgical History:  Procedure Laterality Date   2D Echo  07/2001   BACK SURGERY     CAROTID PTA/STENT INTERVENTION Right 04/07/2022   Procedure: CAROTID PTA/STENT INTERVENTION;  Surgeon: Jama Cordella MATSU, MD;  Location: [facility];  Service: Cardiovascular;  Laterality: Right;   COLONOSCOPY     ENDOBRONCHIAL ULTRASOUND Left 06/19/2022   Procedure: ENDOBRONCHIAL ULTRASOUND;  Surgeon: Tamea Dedra CROME, MD;  Location: [facility];  Service: Pulmonary;  Laterality: Left;   ESOPHAGOGASTRODUODENOSCOPY  02/13/2007   gastritis and duodenitis without bleed   ESOPHAGOGASTRODUODENOSCOPY N/A 06/26/2014   Procedure: ESOPHAGOGASTRODUODENOSCOPY (EGD);  Surgeon: Toribio SHAUNNA Cedar, MD;  Location[facility];  Service: Endoscopy;   Laterality: N/A;   ESOPHAGOGASTRODUODENOSCOPY (EGD) WITH PROPOFOL  N/A 04/08/2020   Procedure: ESOPHAGOGASTRODUODENOSCOPY (EGD) WITH PROPOFOL ;  Surgeon: Aneita Gwendlyn DASEN, MD;  Location: [facility] ENDOSCOPY;  Service: Endoscopy;  Laterality: N/A;   FLEXIBLE BRONCHOSCOPY Left 06/19/2022   Procedure: FLEXIBLE BRONCHOSCOPY;  Surgeon: Tamea Dedra CROME, MD;  Location: [facility] OR[facility];  Service: Pulmonary;  Laterality: Left;   HOT HEMOSTASIS N/A 04/08/2020   Procedure: HOT HEMOSTASIS (ARGON PLASMA COAGULATION/BICAP);  Surgeon: Aneita Gwendlyn DASEN, MD;  Location: [facility];  Service: Endoscopy;  Laterality: N/A;   IR IMAGING GUIDED PORT INSERTION  06/30/2022   KNEE ARTHROSCOPY Right 11/1998   LEFT HEART CATH AND CORONARY ANGIOGRAPHY N/A 05/23/2021   Procedure: LEFT HEART CATH AND CORONARY ANGIOGRAPHY;  Surgeon: Wendel Lurena POUR, MD;  Location: [facility];  Service: Cardiovascular;  Laterality: N/A;   LOWER EXTREMITY ANGIOGRAPHY Right 12/27/2017   Procedure: LOWER EXTREMITY ANGIOGRAPHY;  Surgeon: Marea Selinda RAMAN, MD;  Location: [facility];  Service: Cardiovascular;  Laterality: Right;   LOWER EXTREMITY ANGIOGRAPHY Left 01/10/2018   Procedure: LOWER EXTREMITY ANGIOGRAPHY;  Surgeon: Marea Selinda RAMAN, MD;  Location: [facility];  Service: Cardiovascular;  Laterality: Left;   LOWER EXTREMITY ANGIOGRAPHY Left 05/29/2018   Procedure: LOWER EXTREMITY ANGIOGRAPHY;  Surgeon: Marea Selinda RAMAN, MD;  Location: [facility];  Service: Cardiovascular;  Laterality: Left;   LOWER EXTREMITY ANGIOGRAPHY Left 05/30/2018   Procedure: Lower Extremity Angiography;  Surgeon: Marea Selinda RAMAN, MD;  Location: [facility];  Service: Cardiovascular;  Laterality: Left;   LP  07/2001   Microwave thermotherapy prostate  08/28/2007   [place]   Persantine cardiolite  04/1999   EF 68%   POLYPECTOMY  12/1993   Stress myoview   08/2004   EF 67%   UPPER GASTROINTESTINAL ENDOSCOPY      Family History  Problem Relation  Age of Onset   Rheum arthritis Mother    Diabetes Father    Alcohol abuse Paternal Uncle    Alcohol abuse Paternal Uncle    Alcohol abuse Paternal Uncle    Heart disease Neg Hx    Stroke Neg Hx    Cancer Neg  Hx    Drug abuse Neg Hx    Depression Neg Hx    Colon cancer Neg Hx    Rectal cancer Neg Hx    Stomach cancer Neg Hx    Kidney cancer Neg Hx    Bladder Cancer Neg Hx    Prostate cancer Neg Hx     No Known Allergies  Current Outpatient Medications on File Prior to Visit  Medication Sig Dispense Refill   acetaminophen  (TYLENOL ) 500 MG tablet Take 1,000 mg by mouth every 6 (six) hours. Rapid release     albuterol  (VENTOLIN  HFA) 108 (90 Base) MCG/ACT inhaler Inhale 2 puffs into the lungs every 6 (six) hours as needed for wheezing or shortness of breath. 8 g 2   atorvastatin  (LIPITOR) 20 MG tablet TAKE 1 TABLET BY MOUTH IN THE EVENING FOR CHOLESTEROL 90 tablet 0   BREO ELLIPTA  100-25 MCG/ACT AEPB Inhale 1 puff into the lungs daily. 60 each 5   clopidogrel  (PLAVIX ) 75 MG tablet TAKE 1 TABLET BY MOUTH EVERY DAY 90 tablet 3   FIBER PO Take 1 capsule by mouth in the morning.     finasteride  (PROSCAR ) 5 MG tablet TAKE 1 TABLET (5 MG TOTAL) BY MOUTH DAILY. 90 tablet 0   GLUCOSAMINE HCL PO Take 1 tablet by mouth daily.     insulin  aspart (NOVOLOG ) 100 UNIT/ML injection Inject 10 Units into the skin once.     Insulin  Glargine (BASAGLAR KWIKPEN) 100 UNIT/ML Inject 30 Units into the skin every morning.     lisinopril  (ZESTRIL ) 30 MG tablet Take 1 tablet (30 mg total) by mouth daily. for blood pressure. 90 tablet 0   metFORMIN  (GLUCOPHAGE ) 500 MG tablet TAKE 2 TABLETS BY MOUTH TWICE A DAY WITH MEAL FOR DIABETES 360 tablet 2   pantoprazole  (PROTONIX ) 40 MG tablet TAKE 1 TABLET (40 MG TOTAL) BY MOUTH DAILY FOR HEARTBURN 90 tablet 0   sildenafil  (VIAGRA ) 100 MG tablet Take 1 tablet (100 mg total) by mouth daily as needed for erectile dysfunction. 30 tablet 0   tamsulosin  (FLOMAX ) 0.4 MG CAPS  capsule TAKE 1 CAPSULE BY MOUTH 2 TIMES DAILY. 180 capsule 3   vitamin B-12 (CYANOCOBALAMIN) 1000 MCG tablet Take 1,000 mcg by mouth in the morning.     Current Facility-Administered Medications on File Prior to Visit  Medication Dose Route Frequency Provider Last Rate Last Admin   sodium chloride  flush (NS) 0.9 % injection 10 mL  10 mL Intracatheter PRN Melanee Annah BROCKS, MD   10 mL at 01/19/23 1205    BP (!) 178/78   Pulse 67   Temp 97.9 F (36.6 C) (Oral)   Ht 5' 6.25 (1.683 m)   Wt 178 lb 4 oz (80.9 kg)   SpO2 96%   BMI 28.55 kg/m  Objective:   Physical Exam Cardiovascular:     Rate and Rhythm: Normal rate and regular rhythm.  Pulmonary:     Effort: Pulmonary effort is normal.     Breath sounds: Normal breath sounds.  Musculoskeletal:     Cervical back: Neck supple.  Skin:    General: Skin is warm and dry.  Neurological:     Mental Status: He is alert and oriented to person, place, and time.  Psychiatric:        Mood and Affect: Mood normal.     Physical Exam        Assessment & Plan:  Mixed hyperlipidemia Assessment & Plan: Repeat lipid panel pending.  We  discussed that he can hold atorvastatin  x 1 to 2 weeks to see if his cramping abates. Continue atorvastatin  20 mg daily for now.  Orders: -     Lipid panel  PAD (peripheral artery disease) Assessment & Plan: Stable.  Continue clopidogrel  75 mg daily, atorvastatin  20 mg daily. Vascular service office notes reviewed from September 2025.   Essential hypertension Assessment & Plan: Above goal today which is historical for him.  Reviewed cardiology notes from August 2025.  Continue lisinopril  30 mg daily.  He will monitor blood pressure at home   Bilateral carotid artery stenosis Assessment & Plan: Stable.  Reviewed vascular services office notes from September 2025. Continue clopidogrel  75 mg daily, atorvastatin  20 mg daily   Recurrent non-small cell lung cancer (HCC) Assessment &  Plan: Stable.  No recurrence.  Reviewed pulmonology office notes from October 2025 and oncology office notes from October 2025  Continue Breo elliptica inhaler 100-25 micrograms, 1 puff daily. Reviewed CT chest abdomen pelvis from September 2025.   Chronic obstructive pulmonary disease, unspecified COPD type [facility]) Assessment & Plan: Stable  Reviewed pulmonology office notes from October 2025. Continue Breo Ellipta  inhaler 100-25 mcg, 1 puff daily which is more affordable Continue albuterol  inhaler as needed   Gastroesophageal reflux disease with esophagitis without hemorrhage Assessment & Plan: No concerns today.  Continue pantoprazole  40 mg daily.   Type 2 diabetes mellitus with hyperglycemia, with long-term current use of insulin  (HCC) Assessment & Plan: Deteriorated.  Reviewed endocrinology notes from October 2025. Continue Basaglar 30 units daily and NovoLog  8 units as needed with meals.   BPH with obstruction/lower urinary tract symptoms Assessment & Plan: Overall stable.  Continue tamsulosin  0.4 mg twice daily.     Assessment and Plan Assessment & Plan         Comer MARLA Gaskins, NP     History of Present Illness

## 2023-12-19 NOTE — Assessment & Plan Note (Signed)
 Overall stable.  Continue tamsulosin  0.4 mg twice daily.

## 2023-12-19 NOTE — Assessment & Plan Note (Signed)
 Stable  Reviewed pulmonology office notes from October 2025. Continue Breo Ellipta  inhaler 100-25 mcg, 1 puff daily which is more affordable Continue albuterol  inhaler as needed

## 2023-12-19 NOTE — Assessment & Plan Note (Signed)
 Stable.  Reviewed vascular services office notes from September 2025. Continue clopidogrel  75 mg daily, atorvastatin  20 mg daily

## 2023-12-19 NOTE — Assessment & Plan Note (Signed)
 Deteriorated.  Reviewed endocrinology notes from October 2025. Continue Basaglar 30 units daily and NovoLog  8 units as needed with meals.

## 2023-12-19 NOTE — Assessment & Plan Note (Signed)
 Stable.  Continue clopidogrel  75 mg daily, atorvastatin  20 mg daily. Vascular service office notes reviewed from September 2025.

## 2023-12-19 NOTE — Assessment & Plan Note (Signed)
 No concerns today.  Continue pantoprazole 40 mg daily.

## 2023-12-24 ENCOUNTER — Telehealth: Payer: Self-pay | Admitting: Oncology

## 2023-12-24 NOTE — Telephone Encounter (Signed)
 Pt s/o Arland called to r/s appt - said pt will be out of town and needed appt on 12/10 instead of 12/12 - r/s appt - pt confirmed appt change in background - LH

## 2024-01-14 ENCOUNTER — Other Ambulatory Visit: Payer: Self-pay | Admitting: Primary Care

## 2024-01-14 DIAGNOSIS — K2100 Gastro-esophageal reflux disease with esophagitis, without bleeding: Secondary | ICD-10-CM

## 2024-01-14 DIAGNOSIS — Z794 Long term (current) use of insulin: Secondary | ICD-10-CM | POA: Diagnosis not present

## 2024-01-14 DIAGNOSIS — E1129 Type 2 diabetes mellitus with other diabetic kidney complication: Secondary | ICD-10-CM | POA: Diagnosis not present

## 2024-01-14 DIAGNOSIS — I10 Essential (primary) hypertension: Secondary | ICD-10-CM | POA: Diagnosis not present

## 2024-01-14 DIAGNOSIS — R809 Proteinuria, unspecified: Secondary | ICD-10-CM | POA: Diagnosis not present

## 2024-01-14 DIAGNOSIS — E1165 Type 2 diabetes mellitus with hyperglycemia: Secondary | ICD-10-CM | POA: Diagnosis not present

## 2024-01-14 DIAGNOSIS — Z1331 Encounter for screening for depression: Secondary | ICD-10-CM | POA: Diagnosis not present

## 2024-01-14 DIAGNOSIS — E1142 Type 2 diabetes mellitus with diabetic polyneuropathy: Secondary | ICD-10-CM | POA: Diagnosis not present

## 2024-01-14 DIAGNOSIS — E1159 Type 2 diabetes mellitus with other circulatory complications: Secondary | ICD-10-CM | POA: Diagnosis not present

## 2024-01-16 ENCOUNTER — Inpatient Hospital Stay: Attending: Oncology

## 2024-01-16 DIAGNOSIS — C3412 Malignant neoplasm of upper lobe, left bronchus or lung: Secondary | ICD-10-CM | POA: Diagnosis present

## 2024-01-16 DIAGNOSIS — Z87891 Personal history of nicotine dependence: Secondary | ICD-10-CM | POA: Insufficient documentation

## 2024-01-16 DIAGNOSIS — Z452 Encounter for adjustment and management of vascular access device: Secondary | ICD-10-CM | POA: Insufficient documentation

## 2024-01-18 ENCOUNTER — Inpatient Hospital Stay

## 2024-01-22 ENCOUNTER — Other Ambulatory Visit: Payer: Self-pay | Admitting: Primary Care

## 2024-01-22 DIAGNOSIS — E782 Mixed hyperlipidemia: Secondary | ICD-10-CM

## 2024-01-26 ENCOUNTER — Other Ambulatory Visit: Payer: Self-pay | Admitting: Primary Care

## 2024-01-26 DIAGNOSIS — I10 Essential (primary) hypertension: Secondary | ICD-10-CM

## 2024-01-27 ENCOUNTER — Other Ambulatory Visit: Payer: Self-pay | Admitting: Urology

## 2024-01-27 DIAGNOSIS — N138 Other obstructive and reflux uropathy: Secondary | ICD-10-CM

## 2024-01-28 ENCOUNTER — Encounter: Payer: Self-pay | Admitting: Podiatry

## 2024-01-28 ENCOUNTER — Ambulatory Visit: Admitting: Podiatry

## 2024-01-28 DIAGNOSIS — B351 Tinea unguium: Secondary | ICD-10-CM

## 2024-01-28 DIAGNOSIS — M79674 Pain in right toe(s): Secondary | ICD-10-CM | POA: Diagnosis not present

## 2024-01-28 DIAGNOSIS — M79675 Pain in left toe(s): Secondary | ICD-10-CM | POA: Diagnosis not present

## 2024-01-28 DIAGNOSIS — E1151 Type 2 diabetes mellitus with diabetic peripheral angiopathy without gangrene: Secondary | ICD-10-CM | POA: Diagnosis not present

## 2024-01-28 NOTE — Progress Notes (Signed)
"  °  Subjective:  Patient ID: Antonio [name], male    DOB: 1945-07-09,  MRN: [id number]  Antonio [name] presents to clinic today for at risk foot care. Pt has h/o NIDDM with PAD and painful thick toenails that are difficult to trim. Pain interferes with ambulation. Aggravating factors include wearing enclosed shoe gear. Pain is relieved with periodic professional debridement. He is followed closely by Vascular Surgery for PAD. Chief Complaint  Patient presents with   Diabetes    A1c 8.8 He saw [name] in Nov.   New problem(s): None.   PCP is Gretta Comer POUR, NP.  Allergies[1]  Review of Systems: Negative except as noted in the HPI.  Objective:  There were no vitals filed for this visit. Antonio [name] is a pleasant 78 y.o. male WD, WN in NAD. AAO x 3.  Vascular Examination: CFT <4 seconds b/l. DP pulses diminished b/l. PT pulses diminished b/l. Digital hair absent. Skin temperature gradient warm to cool b/l. No ischemia or gangrene. No cyanosis or clubbing noted b/l. No edema noted b/l LE.   Neurological Examination: Pt has subjective symptoms of neuropathy. Protective sensation diminished with 10g monofilament b/l.  Dermatological Examination: Pedal skin thin, shiny and atrophic b/l. No open wounds. No interdigital macerations.   Toenails 1-5 b/l thick, discolored, elongated with subungual debris and pain on dorsal palpation.   No corns, calluses, nor porokeratotic lesions.  Musculoskeletal Examination: Muscle strength 5/5 to all lower extremity muscle groups bilaterally. Hammertoes 2-5 b/l. Pes planus deformity noted left foot.. Patient ambulates with cane assistance.  Radiographs: None  Assessment/Plan: 1. Pain due to onychomycosis of toenails of both feet   2. Type II diabetes mellitus with peripheral circulatory disorder [facility])     Consent given for treatment. Patient examined. All patient's and/or POA's questions/concerns addressed on today's visit. Toenails 1-5 b/l  debrided in length and girth without incident. Continue foot and shoe inspections daily. Monitor blood glucose per PCP/Endocrinologist's recommendations. Continue soft, supportive shoe gear daily. Report any pedal injuries to medical professional. Call office if there are any questions/concerns. -Patient/POA to call should there be question/concern in the interim.   Return in about 3 months (around 04/27/2024).  Delon LITTIE Merlin, DPM      [facility] LOCATION: [address] [place] [address]                   Office [phone]   [facility] LOCATION: [address] [place], [place] [address] Office [phone]      [1] No Known Allergies  "

## 2024-02-06 ENCOUNTER — Ambulatory Visit: Admitting: Radiation Oncology

## 2024-02-27 ENCOUNTER — Encounter: Payer: Self-pay | Admitting: Radiation Oncology

## 2024-02-27 ENCOUNTER — Ambulatory Visit
Admission: RE | Admit: 2024-02-27 | Discharge: 2024-02-27 | Disposition: A | Discharge status: Home / Self Care | Source: Ambulatory Visit | Attending: Radiation Oncology | Admitting: Radiation Oncology

## 2024-02-27 VITALS — BP 188/61 | HR 51 | Temp 97.1°F | Resp 16 | Ht 66.25 in | Wt 179.0 lb

## 2024-02-27 DIAGNOSIS — C3490 Malignant neoplasm of unspecified part of unspecified bronchus or lung: Secondary | ICD-10-CM

## 2024-02-27 DIAGNOSIS — Z85118 Personal history of other malignant neoplasm of bronchus and lung: Secondary | ICD-10-CM | POA: Insufficient documentation

## 2024-02-27 DIAGNOSIS — Z923 Personal history of irradiation: Secondary | ICD-10-CM | POA: Insufficient documentation

## 2024-02-28 ENCOUNTER — Other Ambulatory Visit: Payer: Self-pay

## 2024-02-28 NOTE — Progress Notes (Signed)
 Radiation Oncology Follow up Note  Name: Antonio [name]   Date:   02/27/2024 MRN:  [id number] DOB: [birth year]    This 79 y.o. male presents to the clinic today for 20-month follow-up status post concurrent chemoradiation therapy for recurrent non-small cell lung cancer left upper lobe with mediastinal adenopathy patient treated with SBRT over 2 years prior.  REFERRING PROVIDER: Gretta Comer POUR, NP  HPI: Patient is a 79 year old male now out 16 months having salvage chemoradiation therapy for recurrent non-small cell lung cancer.  Seen today in routine follow-up he is doing well specifically denies cough hemoptysis chest tightness or any change in his pulmonary status..  He had imaging CT scan back in September showing slightly increased perihilar masslike consolidation with no new suspicious nodularity in the treatment bed findings favored to reflect posterior radiation changes.  No evidence of progressive disease in the chest abdomen or pelvis.  Patient has completed maintenance Durvalumab  and is currently being followed closely by medical oncology.  He is also closely followed by Dr. Tamea.  COMPLICATIONS OF TREATMENT: none  FOLLOW UP COMPLIANCE: keeps appointments   PHYSICAL EXAM:  BP (!) 188/61 Comment: 2nd take  Pulse (!) 51   Temp (!) 97.1 F (36.2 C) (Tympanic)   Resp 16   Ht 5' 6.25 (1.683 m)   Wt 179 lb (81.2 kg)   BMI 28.67 kg/m  Well-developed well-nourished patient in NAD. HEENT reveals PERLA, EOMI, discs not visualized.  Oral cavity is clear. No oral mucosal lesions are identified. Neck is clear without evidence of cervical or supraclavicular adenopathy. Lungs are clear to A&P. Cardiac examination is essentially unremarkable with regular rate and rhythm without murmur rub or thrill. Abdomen is benign with no organomegaly or masses noted. Motor sensory and DTR levels are equal and symmetric in the upper and lower extremities. Cranial nerves II through XII are grossly  intact. Proprioception is intact. No peripheral adenopathy or edema is identified. No motor or sensory levels are noted. Crude visual fields are within normal range.  RADIOLOGY RESULTS: CT scans reviewed compatible with above-stated findings  PLAN: At the present time patient is doing well with no evidence of disease.  I have asked to see him back in 6 months for follow-up.  He continues close follow-up care with both pulmonology and medical oncology.  Patient knows to call with any concerns.  I would like to take this opportunity to thank you for allowing me to participate in the care of your patient.SABRA Marcey Penton, MD

## 2024-02-29 ENCOUNTER — Inpatient Hospital Stay: Attending: Oncology

## 2024-04-02 ENCOUNTER — Ambulatory Visit

## 2024-04-08 ENCOUNTER — Other Ambulatory Visit

## 2024-04-10 ENCOUNTER — Ambulatory Visit: Admitting: Pulmonary Disease

## 2024-04-21 ENCOUNTER — Encounter (INDEPENDENT_AMBULATORY_CARE_PROVIDER_SITE_OTHER)

## 2024-04-21 ENCOUNTER — Ambulatory Visit (INDEPENDENT_AMBULATORY_CARE_PROVIDER_SITE_OTHER): Admitting: Vascular Surgery

## 2024-04-22 ENCOUNTER — Ambulatory Visit: Admitting: Oncology

## 2024-05-01 ENCOUNTER — Ambulatory Visit: Admitting: Podiatry

## 2024-06-27 ENCOUNTER — Ambulatory Visit: Admitting: Urology

## 2024-09-04 ENCOUNTER — Ambulatory Visit: Admitting: Radiation Oncology
